# Patient Record
Sex: Male | Born: 1940 | Race: White | Hispanic: No | Marital: Married | Smoking: Former smoker
Health system: Southern US, Community
[De-identification: ages and names within clinical notes are randomized; demographics above are authoritative.]

## PROBLEM LIST (undated history)

## (undated) DIAGNOSIS — I429 Cardiomyopathy, unspecified: Secondary | ICD-10-CM

## (undated) DIAGNOSIS — D649 Anemia, unspecified: Secondary | ICD-10-CM

## (undated) DIAGNOSIS — N189 Chronic kidney disease, unspecified: Secondary | ICD-10-CM

## (undated) DIAGNOSIS — K4020 Bilateral inguinal hernia, without obstruction or gangrene, not specified as recurrent: Secondary | ICD-10-CM

## (undated) DIAGNOSIS — Z860100 Personal history of colon polyps, unspecified: Secondary | ICD-10-CM

## (undated) DIAGNOSIS — R55 Syncope and collapse: Secondary | ICD-10-CM

## (undated) DIAGNOSIS — J90 Pleural effusion, not elsewhere classified: Secondary | ICD-10-CM

## (undated) DIAGNOSIS — I499 Cardiac arrhythmia, unspecified: Secondary | ICD-10-CM

## (undated) DIAGNOSIS — D472 Monoclonal gammopathy: Secondary | ICD-10-CM

## (undated) DIAGNOSIS — I509 Heart failure, unspecified: Secondary | ICD-10-CM

## (undated) DIAGNOSIS — I700 Atherosclerosis of aorta: Secondary | ICD-10-CM

## (undated) DIAGNOSIS — K5790 Diverticulosis of intestine, part unspecified, without perforation or abscess without bleeding: Secondary | ICD-10-CM

## (undated) DIAGNOSIS — N400 Enlarged prostate without lower urinary tract symptoms: Secondary | ICD-10-CM

## (undated) DIAGNOSIS — N185 Chronic kidney disease, stage 5: Secondary | ICD-10-CM

## (undated) DIAGNOSIS — M1990 Unspecified osteoarthritis, unspecified site: Secondary | ICD-10-CM

## (undated) DIAGNOSIS — C439 Malignant melanoma of skin, unspecified: Secondary | ICD-10-CM

## (undated) DIAGNOSIS — T4145XA Adverse effect of unspecified anesthetic, initial encounter: Secondary | ICD-10-CM

## (undated) DIAGNOSIS — Z7901 Long term (current) use of anticoagulants: Secondary | ICD-10-CM

## (undated) DIAGNOSIS — C4359 Malignant melanoma of other part of trunk: Secondary | ICD-10-CM

## (undated) DIAGNOSIS — T8859XA Other complications of anesthesia, initial encounter: Secondary | ICD-10-CM

## (undated) DIAGNOSIS — I4891 Unspecified atrial fibrillation: Secondary | ICD-10-CM

## (undated) DIAGNOSIS — I2510 Atherosclerotic heart disease of native coronary artery without angina pectoris: Secondary | ICD-10-CM

## (undated) DIAGNOSIS — K625 Hemorrhage of anus and rectum: Secondary | ICD-10-CM

## (undated) DIAGNOSIS — E7800 Pure hypercholesterolemia, unspecified: Secondary | ICD-10-CM

## (undated) DIAGNOSIS — N184 Chronic kidney disease, stage 4 (severe): Secondary | ICD-10-CM

## (undated) DIAGNOSIS — I10 Essential (primary) hypertension: Secondary | ICD-10-CM

## (undated) DIAGNOSIS — N2581 Secondary hyperparathyroidism of renal origin: Secondary | ICD-10-CM

## (undated) HISTORY — DX: Benign prostatic hyperplasia without lower urinary tract symptoms: N40.0

## (undated) HISTORY — PX: CATARACT EXTRACTION: SUR2

## (undated) HISTORY — PX: APPENDECTOMY: SHX54

## (undated) HISTORY — DX: Malignant melanoma of skin, unspecified: C43.9

## (undated) HISTORY — PX: TONSILLECTOMY: SUR1361

## (undated) HISTORY — PX: COLONOSCOPY: SHX174

## (undated) HISTORY — DX: Diverticulosis of intestine, part unspecified, without perforation or abscess without bleeding: K57.90

## (undated) HISTORY — PX: JOINT REPLACEMENT: SHX530

## (undated) HISTORY — PX: ANKLE SURGERY: SHX546

## (undated) HISTORY — DX: Hemorrhage of anus and rectum: K62.5

---

## 1983-01-02 HISTORY — PX: ANKLE SURGERY: SHX546

## 2009-09-01 HISTORY — PX: TOTAL HIP ARTHROPLASTY: SHX124

## 2009-09-23 ENCOUNTER — Inpatient Hospital Stay: Payer: Self-pay | Admitting: Orthopedic Surgery

## 2009-09-23 ENCOUNTER — Ambulatory Visit: Payer: Self-pay | Admitting: Internal Medicine

## 2009-09-24 ENCOUNTER — Encounter: Payer: Self-pay | Admitting: Internal Medicine

## 2009-10-12 ENCOUNTER — Encounter: Payer: Self-pay | Admitting: Internal Medicine

## 2009-10-23 ENCOUNTER — Emergency Department: Payer: Self-pay | Admitting: Emergency Medicine

## 2010-01-28 ENCOUNTER — Inpatient Hospital Stay: Payer: Self-pay | Admitting: Internal Medicine

## 2010-01-29 ENCOUNTER — Encounter: Payer: Self-pay | Admitting: Internal Medicine

## 2010-02-02 LAB — PATHOLOGY REPORT

## 2010-02-14 ENCOUNTER — Encounter: Payer: Self-pay | Admitting: Internal Medicine

## 2010-02-14 ENCOUNTER — Ambulatory Visit (INDEPENDENT_AMBULATORY_CARE_PROVIDER_SITE_OTHER): Payer: Medicare Other | Admitting: Internal Medicine

## 2010-02-14 DIAGNOSIS — I4891 Unspecified atrial fibrillation: Secondary | ICD-10-CM | POA: Insufficient documentation

## 2010-02-14 DIAGNOSIS — Z8679 Personal history of other diseases of the circulatory system: Secondary | ICD-10-CM | POA: Insufficient documentation

## 2010-02-14 DIAGNOSIS — I482 Chronic atrial fibrillation, unspecified: Secondary | ICD-10-CM | POA: Insufficient documentation

## 2010-02-21 ENCOUNTER — Encounter: Payer: Self-pay | Admitting: Internal Medicine

## 2010-02-22 NOTE — Assessment & Plan Note (Signed)
Summary: Hospital F/U   Visit Type:  Initial Consult Primary Provider:  Sharla Kidney  CC:  Hospital follow-up. denies chest pain and SOB.[name]  History of Present Illness: Mr Lawrence Perkins is seen at the request ofa Dr Ouida Sills for atrial fibrilation  He has a past medical history notable for the absence of diabetes or hypertension who was found while in hospital in September to have atrial fibrillation with a rapid ventricular response with ECGs demonstrated heart rate of 160. These records were reviewed. He converted spontaneously. He had no associated palpitations. He has no known prior or subsequent history of atrial fibrillation.  Evaluation following that event included an ultrasound which demonstrated mild left atrial enlargement mild to moderate mitral regurgitation and normal left ventricular function. A Holter monitor demonstrated PACs, PVCs, both of which were relatively infrequent and nonsustained atrial tachycardia up to 8 beats.  Thromboembolic risk factors are negative probably except for age-[age] and a reference to possible TIAs multiple years ago as a possible explanation for visual disturbances. His were made by his eye doctor.  He has no exercise intolerance. He has had no problems with chest discomfort. He is limited largely by his hip. He denies a history of dyslipidemia. His family history for heart disease is negative. He has been started on metoprolol 100 mg for this.  Preventive Screening-Counseling & Management  Caffeine-Diet-Exercise     Does Patient Exercise: yes      Drug Use:  no.    Current Medications (verified): 1)  Metoprolol Succinate 100 Mg Xr24h-Tab (Metoprolol Succinate) .[name].. 1 Tablet Once Daily  Allergies (verified): No Known Drug Allergies  Past History:  Family History: Last updated: 02-27-10 Father: deceased-lung fibrosis Mother:deceased-cirrhosis of liver  Social History: Last updated: 02-27-10 Tobacco Use - No.  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  Alcohol Use - no Retired  Married  Regular Exercise - yes Drug Use - no Does Patient Exercise:  yes Drug Use:  no  Review of Systems       full review of systems was negative apart from a history of present illness and past medical history except glasses   Vital Signs:  Patient profile:   70 year old male Height:      72 inches Weight:      207.25 pounds BMI:     28.21 Pulse rate:   62 / minute BP sitting:   140 / 72  (left arm) Cuff size:   large  Vitals Entered By: Rodman Comp CMA 02/27/10 2:18 PM)  Physical Exam  General:  Well developed, well nourished,older Caucasian male appearing his stated agein no acute distress. Head:  normal HEENT Neck:  supple without thyromegaly neck veins flat carotids brisk and full bilaterally without bruits Chest Wall:  no deformities  noted Lungs:  Clear bilaterally to auscultation and percussion. Heart:  regular rate and rhythm without murmurs or gallops Abdomen:  Bowel sounds positive; abdomen soft and non-tender without  Pulsation Msk:  Back normal, normal gait. Muscle strength and tone normal. Pulses:  pulses normal in all 4 extremities  Extremities:  No clubbing or cyanosis or edema Neurologic:  Alert and oriented x 3.no significant motor or sensory defects noted Skin:  warm and dry and without rashes Cervical Nodes:  no significant adenopathy Psych:  Normal  affect.   Impression & Recommendations:  Problem # 1:  ATRIAL FIBRILLATION (ICD-427.31) the patient has atrial fibrillation. He has no associated symptoms. There are 2 issues. The first is the possible recurrence and no consequence of tachycardia-induced cardiomyopathy. I've instructed the patient in how to take his pulse. He is also on metoprolol. We'll plan to decrease his metoprolol from 100-50 as he is currently asymptomatic.  The second issue is thromboembolic risk stratification. The major issue is that he was told 10 years ago that he had TIAs as an explanation for transient visual disturbance. In the event that this is true he would need long-term anticoagulation with warfarin or one of the newer alternatives. In the absence of this, and a CHADS VASC score of one, he would need only low-dose aspirin or nothing at all.  hence clarifying this becomes essential. To that end we'll undertake an MRI scan of his brain. If it is normal we will presume that there is no prior TIA. It is abnormal he'll need long-term oral anticoagulation. The following medications were removed from the medication list:    Aspir-low 81 Mg Tbec (Aspirin) .[name]... 1 tablet once daily His updated medication list for this problem includes:    Metoprolol Succinate 100 Mg Xr24h-tab (Metoprolol succinate) .[name]... Take 1/2 tablet once daily.  Problem # 2:  CEREBROVASCULAR ACCIDENT, HX OF (ICD-V12.50) as above Orders: MRI (MRI)  Other Orders: EKG w/ Interpretation (93000)  Patient Instructions: 1)  Your physician recommends that you follow up as needed. 2)  Your physician has requested that you have a brain MRI. This is scheduled for 02/21/10 @ 2:00pm at [place] on the first floor of the building [employer] is in. There is no prep for this procedure. 3)  Your physician has recommended you make the following change in your medication: DECREASE Metoprolol 100mg  to 1/2 tablet once daily.

## 2010-02-28 NOTE — Procedures (Signed)
Summary: Holter and Event  Holter and Event   Imported By: Zenovia Jarred 02/16/2010 10:12:37  _____________________________________________________________________  External Attachment:    Type:   Image     Comment:   External Document

## 2010-03-14 NOTE — Consult Note (Signed)
Summary: [facility]   Imported By: Sallee Provencal 03/02/2010 14:07:11  _____________________________________________________________________  External Attachment:    Type:   Image     Comment:   External Document

## 2010-05-09 ENCOUNTER — Ambulatory Visit: Payer: Self-pay | Admitting: Orthopedic Surgery

## 2010-05-17 ENCOUNTER — Ambulatory Visit: Payer: Self-pay | Admitting: Orthopedic Surgery

## 2010-07-14 ENCOUNTER — Encounter: Payer: Self-pay | Admitting: Cardiology

## 2010-09-01 ENCOUNTER — Ambulatory Visit: Payer: Self-pay | Admitting: Unknown Physician Specialty

## 2011-01-10 ENCOUNTER — Ambulatory Visit: Payer: Self-pay | Admitting: Internal Medicine

## 2013-06-27 DIAGNOSIS — N183 Chronic kidney disease, stage 3 unspecified: Secondary | ICD-10-CM | POA: Insufficient documentation

## 2013-06-27 DIAGNOSIS — E785 Hyperlipidemia, unspecified: Secondary | ICD-10-CM | POA: Insufficient documentation

## 2013-06-27 DIAGNOSIS — I120 Hypertensive chronic kidney disease with stage 5 chronic kidney disease or end stage renal disease: Secondary | ICD-10-CM | POA: Insufficient documentation

## 2013-09-28 ENCOUNTER — Ambulatory Visit: Payer: Self-pay | Admitting: Unknown Physician Specialty

## 2013-09-29 LAB — PATHOLOGY REPORT

## 2014-01-05 ENCOUNTER — Observation Stay: Payer: Self-pay | Admitting: Internal Medicine

## 2014-01-05 LAB — CBC WITH DIFFERENTIAL/PLATELET
Basophil #: 0.1 10*3/uL (ref 0.0–0.1)
Basophil %: 0.8 %
EOS PCT: 1.9 %
Eosinophil #: 0.2 10*3/uL (ref 0.0–0.7)
HCT: 46.1 % (ref 40.0–52.0)
HGB: 15.2 g/dL (ref 13.0–18.0)
LYMPHS ABS: 1.5 10*3/uL (ref 1.0–3.6)
Lymphocyte %: 14.2 %
MCH: 33.2 pg (ref 26.0–34.0)
MCHC: 32.8 g/dL (ref 32.0–36.0)
MCV: 101 fL — ABNORMAL HIGH (ref 80–100)
MONOS PCT: 2.9 %
Monocyte #: 0.3 x10 3/mm (ref 0.2–1.0)
Neutrophil #: 8.4 10*3/uL — ABNORMAL HIGH (ref 1.4–6.5)
Neutrophil %: 80.2 %
PLATELETS: 224 10*3/uL (ref 150–440)
RBC: 4.57 10*6/uL (ref 4.40–5.90)
RDW: 13.3 % (ref 11.5–14.5)
WBC: 10.5 10*3/uL (ref 3.8–10.6)

## 2014-01-05 LAB — TROPONIN I
TROPONIN-I: 0.07 ng/mL — AB
Troponin-I: <0.02 ng/mL

## 2014-01-05 LAB — BASIC METABOLIC PANEL
Anion Gap: 8 (ref 7–16)
BUN: 22 mg/dL — AB (ref 7–18)
CHLORIDE: 104 mmol/L (ref 98–107)
CO2: 27 mmol/L (ref 21–32)
Calcium, Total: 8.7 mg/dL (ref 8.5–10.1)
Creatinine: 1.59 mg/dL — ABNORMAL HIGH (ref 0.60–1.30)
EGFR (African American): 55 — ABNORMAL LOW
EGFR (Non-African Amer.): 45 — ABNORMAL LOW
Glucose: 153 mg/dL — ABNORMAL HIGH (ref 65–99)
OSMOLALITY: 284 (ref 275–301)
POTASSIUM: 4.2 mmol/L (ref 3.5–5.1)
Sodium: 139 mmol/L (ref 136–145)

## 2014-01-05 LAB — URINALYSIS, COMPLETE
BACTERIA: NONE SEEN
BLOOD: NEGATIVE
Bilirubin,UR: NEGATIVE
Glucose,UR: NEGATIVE mg/dL (ref 0–75)
Hyaline Cast: 6
LEUKOCYTE ESTERASE: NEGATIVE
NITRITE: NEGATIVE
PH: 7 (ref 4.5–8.0)
SPECIFIC GRAVITY: 1.015 (ref 1.003–1.030)
Squamous Epithelial: NONE SEEN

## 2014-01-05 LAB — CK TOTAL AND CKMB (NOT AT ARMC)
CK, Total: 69 U/L (ref 39–308)
CK, Total: 52 U/L (ref 39–308)
CK-MB: 1.5 ng/mL (ref 0.5–3.6)
CK-MB: 1.2 ng/mL (ref 0.5–3.6)

## 2014-01-06 LAB — BASIC METABOLIC PANEL
Anion Gap: 5 — ABNORMAL LOW (ref 7–16)
BUN: 17 mg/dL (ref 7–18)
CO2: 26 mmol/L (ref 21–32)
CREATININE: 1.24 mg/dL (ref 0.60–1.30)
Calcium, Total: 8.1 mg/dL — ABNORMAL LOW (ref 8.5–10.1)
Chloride: 109 mmol/L — ABNORMAL HIGH (ref 98–107)
EGFR (Non-African Amer.): >60
Glucose: 90 mg/dL (ref 65–99)
Osmolality: 280 (ref 275–301)
Potassium: 4.3 mmol/L (ref 3.5–5.1)
SODIUM: 140 mmol/L (ref 136–145)

## 2014-01-06 LAB — CK TOTAL AND CKMB (NOT AT ARMC)
CK, TOTAL: 57 U/L (ref 39–308)
CK-MB: 1.1 ng/mL (ref 0.5–3.6)

## 2014-01-06 LAB — TROPONIN I: Troponin-I: 0.06 ng/mL — ABNORMAL HIGH

## 2014-02-04 ENCOUNTER — Ambulatory Visit: Payer: Self-pay | Admitting: Orthopedic Surgery

## 2014-02-04 LAB — URINALYSIS, COMPLETE
BILIRUBIN, UR: NEGATIVE
Bacteria: NONE SEEN
Glucose,UR: NEGATIVE mg/dL (ref 0–75)
Ketone: NEGATIVE
LEUKOCYTE ESTERASE: NEGATIVE
Nitrite: NEGATIVE
PH: 6 (ref 4.5–8.0)
Protein: 30
RBC,UR: 1 /HPF (ref 0–5)
SPECIFIC GRAVITY: 1.011 (ref 1.003–1.030)
SQUAMOUS EPITHELIAL: NONE SEEN
WBC UR: NONE SEEN /HPF (ref 0–5)

## 2014-02-04 LAB — BASIC METABOLIC PANEL
Anion Gap: 7 (ref 7–16)
BUN: 23 mg/dL — ABNORMAL HIGH (ref 7–18)
CO2: 27 mmol/L (ref 21–32)
Calcium, Total: 8.7 mg/dL (ref 8.5–10.1)
Chloride: 105 mmol/L (ref 98–107)
Creatinine: 1.25 mg/dL (ref 0.60–1.30)
EGFR (Non-African Amer.): >60
Glucose: 84 mg/dL (ref 65–99)
Osmolality: 280 (ref 275–301)
Potassium: 4.6 mmol/L (ref 3.5–5.1)
SODIUM: 139 mmol/L (ref 136–145)

## 2014-02-04 LAB — CBC
HCT: 44.3 % (ref 40.0–52.0)
HGB: 14.8 g/dL (ref 13.0–18.0)
MCH: 32.7 pg (ref 26.0–34.0)
MCHC: 33.3 g/dL (ref 32.0–36.0)
MCV: 98 fL (ref 80–100)
Platelet: 190 10*3/uL (ref 150–440)
RBC: 4.51 10*6/uL (ref 4.40–5.90)
RDW: 12.9 % (ref 11.5–14.5)
WBC: 4.8 10*3/uL (ref 3.8–10.6)

## 2014-02-04 LAB — APTT: Activated PTT: 30.4 secs (ref 23.6–35.9)

## 2014-02-04 LAB — PROTIME-INR
INR: 1
PROTHROMBIN TIME: 13.7 s

## 2014-02-04 LAB — MRSA PCR SCREENING

## 2014-02-04 LAB — SEDIMENTATION RATE: Erythrocyte Sed Rate: 13 mm/hr (ref 0–20)

## 2014-02-18 ENCOUNTER — Inpatient Hospital Stay: Payer: Self-pay | Admitting: Orthopedic Surgery

## 2014-04-26 LAB — SURGICAL PATHOLOGY

## 2014-05-02 NOTE — Discharge Summary (Signed)
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   OPERATION: On 02/18/2014 the patient had removal of deep hardware with a right total hip replacement.   ANESTHESIA: Spinal.   SURGEON: Hessie Knows, MD.   COMPLICATIONS: None.   ESTIMATED BLOOD LOSS: Was 1500 mL.  INTRAVENOUS FLUIDS GIVEN: 2500 ml IV fluid.  IMPLANTS USED: Medacta 8AMIS stem, 58 mm Versafitcup DM and liner and a L28 head.   The patient was stabilized, brought to the recovery room and then brought down to the orthopedic floor where he was treated for pain control and physical therapy.   HISTORY AND PHYSICAL: The patient is a 74 year old male that presented for persistent pain involving his right hip. The patient is status post right hip pinning done in 2011. The patient has been refractory to conservative management.   PHYSICAL EXAMINATION: GENERAL: Alert male with an antalgic gait and a very slow gait with pain with limping.  EXTREMITIES: Right lower extremity: The patient has full 100 degree hip flexion with -10 degrees internal rotation and 10 degrees external rotation with pain. The patient has a benign knee exam. The patient has neurovascular that intact.   HOSPITAL COURSE: After initial admission on 02/18/2014, the patient was brought to the orthopedic floor. On postoperative day 1, the patient had a hemoglobin of 11.4 after receiving transfused blood. The patient dropped down to 10.3 on 02/19. After the hemoglobin of 10.3, the patient had physical therapy work with him. He was able to ambulate 275 feet on postoperative day 1, had a bowel movement that evening and then was ready to go home on 02/20/2014.    CONDITION AT DISCHARGE: Stable.   DISPOSITION: The patient was sent home with home health, physical therapy.   DISCHARGE INSTRUCTIONS: The patient will follow up at [facility] in 2 weeks. The patient will do weight bear as tolerated on the affected leg. The patient will use 1 to 2 pillows and use anterior hip precautions. The patient will use knee-high TED hose on both legs, removed at nighttime. The patient will do elevation of his heels off the bed and use incentive spirometer as well as be encouraged to do cough and deep breathing. The patient's diet is regular. The patient will keep his dressing clean and dry and try not to get it wet. The patient will call the clinic if there is any bright red bleeding, calf pain, bowel or bladder difficulty or any fever greater than 101.5. The patient will do home health physical therapy, working on gait training and strengthening.   DISCHARGE MEDICATIONS: Are to resume home medications and then to add Tylenol 500 mg 1 tablet every 4 hours as needed for fever or light pain and oxycodone 5 mg 1 tablet every 4 hours as needed for severe pain as well as Lovenox 40 mg subcutaneous once a day for 14 days and then discontinue this and begin aspirin 81 mg once a day.    ____________________________ Lenna Sciara. Reche Dixon, [place] jtm:TT D: 02/20/2014 07:05:07 ET T: 02/20/2014 13:40:02 ET JOB#: [id number]  cc: J. Reche Dixon, [place], <Dictator> J Unknown Flannigan [facility] PA ELECTRONICALLY SIGNED 02/21/2014 7:12

## 2014-05-02 NOTE — H&P (Signed)
PATIENT NAME:  Lawrence Perkins, Lawrence Perkins MR#:  [id number] DATE OF BIRTH:  06/20/1940  DATE OF ADMISSION:  01/05/2014  ADMITTING PHYSICIAN: Gladstone Lighter, MD  PRIMARY CARE PHYSICIAN: Ocie Cornfield. Ouida Sills, MD  CHIEF COMPLAINT: Syncope.   HISTORY OF PRESENT ILLNESS: Lawrence Perkins is a 74 year old healthy Caucasian male with no significant past medical history other than 1 episode of paroxysmal atrial fibrillation after a surgery, benign prostatic hypertrophy, who presents to the hospital after he passed out at the badminton court today. The patient is awake, alert, oriented at this time. States he as mentioned badminton, doubles, with his friends and he bumped into his partner chasing a birdie and then hit his head and passed out. He does not remember anything else after that. Prior to that, he denies any dizziness, chest pain, dyspnea, or other aura. He has not had syncopal episodes in the past. He states he might have been dehydration, as his labs indicate at this time. He denies any nausea, vomiting, diarrhea, or other illnesses at this time. He is otherwise relatively healthy.   PAST MEDICAL HISTORY:  1.  Benign prostatic hypertrophy.  2.  Paroxysmal atrial fibrillation, once after his surgery.   SURGERIES:  1.  Appendectomy.  2.  Right hip replacement surgery.   ALLERGIES TO MEDICATIONS: No known drug allergies.   CURRENT HOME MEDICATIONS:  1.  Aspirin 81 mg p.o. daily.  2.  Flomax 0.4 mg p.o. daily.  3.  Metoprolol 50 mg p.o. daily.   SOCIAL HISTORY: Lives at home. No history of any smoking. Occasional alcohol use.   FAMILY HISTORY: Significant for unknown cancer in father, and mom with cirrhosis and liver disease.   REVIEW OF SYSTEMS:  CONSTITUTIONAL: No fever, fatigue, or weakness.  EYES: No blurred vision, double vision, inflammation or glaucoma.  EARS, NOSE, AND THROAT: No tinnitus, ear pain, hearing loss, epistaxis, or discharge.  RESPIRATORY: No cough, wheeze, hemoptysis, or COPD.   CARDIOVASCULAR: No chest pain, orthopnea, edema, arrhythmia, palpitations. Positive for syncope.  GASTROINTESTINAL: No nausea, vomiting, diarrhea, abdominal pain, hematemesis, or melena.  GENITOURINARY: No dysuria, hematuria, renal calculus, frequency, or incontinence.  ENDOCRINE: No polyuria, nocturia, thyroid problems, heat or cold intolerance.  HEMATOLOGY: No anemia, easy bruising, or bleeding.   SKIN: No acne, rash, or lesions.  MUSCULOSKELETAL: No neck, back or shoulder pain, arthritis, or gout.  NEUROLOGIC: No numbness, weakness, CVA, TIA, or seizures.  PSYCHOLOGICAL: No anxiety, insomnia or depression.   PHYSICAL EXAMINATION:  VITAL SIGNS: Temperature 97.4 degrees Fahrenheit, pulse 66, respirations 18, blood pressure 145/74, pulse of 98% on room air.  GENERAL: Well built, well nourished male, sitting in bed, not in any acute distress.  HEENT: Normocephalic, atraumatic. Pupils equal, round, reacting to light. Anicteric sclerae. Extraocular movements intact. Oropharynx is clear, without erythema, mass or exudates. NECK: Supple. No thyromegaly, JVD, or carotid bruit.  No lymphadenopathy.  LUNGS: Moving air bilaterally. No wheeze or crackles. No use of accessory muscles for breathing.  CARDIOVASCULAR: S1, S2. Regular rate and rhythm. No murmurs, rubs, or gallops.  ABDOMEN: Soft, nontender, nondistended. No hepatosplenomegaly. Normal bowel sounds.  EXTREMITIES: No pedal edema. No clubbing or cyanosis. There are 2+ dorsalis pedis pulses palpable bilaterally.  SKIN: No acne, rash or lesions.  LYMPHATICS: No cervical or inguinal lymphadenopathy.  NEUROLOGIC: Cranial nerves intact. No focal motor or sensory deficits.  PSYCHOLOGICAL: The patient is awake, alert, oriented x 3.   LABORATORY DATA: WBC 10.5, hemoglobin 15.2, hematocrit 46.1, platelet count 224,000.  Sodium 139, potassium  4.2, chloride 104, bicarbonate 27, BUN 22, creatinine 1.59 and glucose 153, calcium of 8.7. Troponin less than  0.02. Urinalysis negative for any infection. CT of the head without contrast showing normal for age; noncontrast CT appearance.   Chest x-ray showing left lower lobe airspace consolidation; otherwise clear. Cardiac enlargement without heart failure.   EKG showing sinus rhythm with some PACs. Heart rate of 64. No acute ST-T wave abnormalities noted.   ASSESSMENT AND PLAN: This is a 74 year old male with a past medical history significant for BPH and hypertension, admitted after a syncopal episode.  1.  Syncope after a concussion, also dehydration. We will admit under observation, monitored on telemetry. Also has history of paroxysmal atrial fibrillation. Recycle troponins. IV fluids.  CT head is normal.  2.  Left lower lobe consolidation versus atelectasis. The patient is afebrile. No white count. No respiratory symptoms. We will consider it as atelectasis and encourage incentive spirometry. Hold off on antibiotics at this time.  3.  Paroxysmal atrial fibrillation. Remote history after his hip surgery. Currently in normal sinus rhythm. Continue aspirin and Toprol. No other risk factors.  4.  Benign prostatic hypertrophy. Continue Flomax.  5.  Deep vein thrombosis prophylaxis.   CODE STATUS: Full Code.   TIME SPENT ON ADMISSION: 50 minutes.    ____________________________ Gladstone Lighter, MD rk:MT D: 01/05/2014 15:16:56 ET T: 01/05/2014 15:53:27 ET JOB#: [id number]  cc: Gladstone Lighter, MD, <Dictator> Ocie Cornfield. Ouida Sills, MD Gladstone Lighter MD ELECTRONICALLY SIGNED 01/20/2014 13:33

## 2014-05-02 NOTE — Op Note (Signed)
PATIENT NAME:  Lawrence Perkins, Lawrence Perkins MR#:  [id number] DATE OF BIRTH:  [birth year]  DATE OF PROCEDURE:  02/18/2014   PREOPERATIVE DIAGNOSIS: Right hip severe osteoarthritis, possible avascular necrosis with retained hardware.   POSTOPERATIVE DIAGNOSIS: Right hip severe osteoarthritis, possible avascular necrosis with retained hardware.   PROCEDURE: Removal of deep hardware and right total hip replacement.   ANESTHESIA: Spinal.   SURGEON: Hessie Knows, M.D.   DESCRIPTION OF PROCEDURE: The patient was brought to the Operating Room and after adequate anesthesia was obtained, the patient was placed on the operative table with the left leg on the well-padded table, right foot in the Medacta attachment.  It was prepped and draped in the usual sterile fashion, appropriate patient identification and timeout procedure were completed. The prior lateral incision was opened, but the screws could to be palpated and the anterior approach was then carried out centered over the TFL and greater trochanter. The TFL was retracted laterally and deep fascia incised with ligation of the lateral femoral circumflex vessels.  The anterior capsule was opened and retractors placed. Femoral neck cut was made and the head was removed in sections until the screws could be identified and pushed out laterally at which point there were removed without difficulty along with the washers. There is quite a bit of oozing from the femoral bone during this part of the procedure which did not really stop until implants were placed. The hip was then sequentially reamed. It was very sclerotic and the head had significant deformity with reaming to 58 mm, which gave good bleeding bone.  A 58 mm cup fit well and was impacted into place. The leg was then externally rotated.  It was quite stiff and pubofemoral and ischiofemoral release were required.  The leg was dropped into extension and broaching was carried out to a size 8, which gave a good fill with  an L head on trials, the leg length appeared restored as it had been fairly short approximately 1 cm short compared to the other side at the start of the case. The final components were placed with the 8 standard AMI Stem impacted down the canal, the L28 head plus and the DM-liner for the Versafit liner cup size 58 assembled on the back table and impacted and the hip was reduced. The wound was irrigated with Betadine  solution and then the deep fascia repaired using running heavy quill suture. A subcutaneous drain placed, 2-0 Quill subcutaneously and skin staples. Xeroform, 4 x 4's, ABD and tape applied. The lateral incision was closed with 2-0 Vicryl and skin staples. Hardware was discarded per patient request.   COMPLICATIONS:  None.   SPECIMEN:  The resected head.   ESTIMATED BLOOD LOSS: 1500 approximately with IV fluids given 2500.     ____________________________ Laurene Footman, MD mjm:at D: 02/18/2014 20:12:08 ET T: 02/18/2014 20:53:30 ET JOB#: [id number]  cc: Laurene Footman, MD, <Dictator> Laurene Footman MD ELECTRONICALLY SIGNED 02/19/2014 7:11

## 2014-05-02 NOTE — Consult Note (Signed)
PATIENT NAME:  Lawrence Perkins, Lawrence Perkins MR#:  [id number] DATE OF BIRTH:  30-Jan-1940  DATE OF CONSULTATION:  01/06/2014  REFERRING PHYSICIAN:   CONSULTING PHYSICIAN:  Laurene Footman, MD  REASON FOR CONSULTATION: Right hip pain.    ATTENDING PHYSICIAN:  Dr. Frazier Richards.   BRIEF HISTORY OF PRESENT ILLNESS: The patient is a 74 year old who fell while playing indoor badminton, had a syncopal episode, and was admitted for this. He has a history of having a right femoral neck fracture about 3 years ago treated with multiple pinning, 1 of the pins backed out and he has 2 residual pins. He has been having significant problems with his gait, limping quite a bit and making him less active.   PHYSICAL EXAMINATION:  On exam he has pain with logrolling of the right leg.  No real tenderness to palpation around the pelvis other than anterior pubis on the right side. He does have a slight flexion contracture.   IMAGING: Shows severe degenerative arthritis on the right hip, moderate on the left. His MRI shows a nondisplaced sacral fracture as well as pubic ramus fracture extending to the acetabulum.   IMPRESSION: Right-sided posttraumatic arthritis with retained hardware and new pelvic fractures.   RECOMMENDATION: Follow up in 3 weeks. He is weight-bearing as tolerated on the right.  His fractures need to heal before he can undergo hip replacement. Order has been placed for a followup with me in approximately 3 weeks.    ____________________________ Laurene Footman, MD mjm:bu D: 01/06/2014 12:44:55 ET T: 01/06/2014 13:10:15 ET JOB#: [id number]  cc: Laurene Footman, MD, <Dictator> Laurene Footman MD ELECTRONICALLY SIGNED 01/07/2014 7:17

## 2014-05-02 NOTE — Consult Note (Signed)
Brief Consult Note: Diagnosis: right side sacrum and pubic rami fractures.   Patient was seen by consultant.   Consult note dictated.   Orders entered.   Comments: recommend WBAT on Right, will need THA in near future but will want to wait for pelvis to heal.  Electronic Signatures: Laurene Footman (MD)  (Signed 06-Jan-16 12:42)  Authored: Brief Consult Note   Last Updated: 06-Jan-16 12:42 by Laurene Footman (MD)

## 2014-08-27 NOTE — Patient Outreach (Signed)
[place] [facility]) Care Management  08/27/2014  [name]. [birth year] [id number]   Referral from HTA tier 4 list, assigned to [name], [facility] for patient outreach.  [name] L. Jammi Morrissette, [place] Care Management Assistant

## 2014-08-31 NOTE — Patient Outreach (Signed)
[place] [facility]) Care Management  08/31/2014  Lawrence Perkins. 1940/08/06 [id number]   Referral from HTA tier 4 list, reassigned to [place], RNCM for patient outreach.  [name] L. Driana Dazey, [place] Care Management Assistant

## 2014-09-07 ENCOUNTER — Other Ambulatory Visit: Payer: Self-pay | Admitting: *Deleted

## 2014-09-07 NOTE — Patient Outreach (Signed)
RNCM was successful in reaching this pt. [facility] services explained. Pt was not interested in participating in [facility] services at this time. RNCM asked pt if she could send him information in the mail about [facility] and pt was agreeable to this.   Plan: Route letter through [facility] which includes [facility] pamphlet. Make [facility] aware this pt is not interested in [facility] services at this time.  Rutherford Limerick RN, BSN  [facility] Care Management [phone])

## 2014-09-07 NOTE — Patient Outreach (Signed)
[facility]) Care Management  09/07/2014  Lawrence Perkins. Feb 01, 1940 [id number]   Notification received from [place] to close case due to patient refusing services.  [name], [name] Care Management Assistant

## 2014-11-15 DIAGNOSIS — Z0000 Encounter for general adult medical examination without abnormal findings: Secondary | ICD-10-CM | POA: Insufficient documentation

## 2015-03-01 DIAGNOSIS — M2012 Hallux valgus (acquired), left foot: Secondary | ICD-10-CM | POA: Diagnosis not present

## 2015-03-01 DIAGNOSIS — M898X9 Other specified disorders of bone, unspecified site: Secondary | ICD-10-CM | POA: Diagnosis not present

## 2015-03-01 DIAGNOSIS — M2042 Other hammer toe(s) (acquired), left foot: Secondary | ICD-10-CM | POA: Diagnosis not present

## 2015-03-01 DIAGNOSIS — M7752 Other enthesopathy of left foot: Secondary | ICD-10-CM | POA: Diagnosis not present

## 2015-04-07 ENCOUNTER — Other Ambulatory Visit: Payer: Self-pay | Admitting: *Deleted

## 2015-04-07 ENCOUNTER — Encounter: Payer: Self-pay | Admitting: *Deleted

## 2015-04-07 NOTE — Patient Outreach (Signed)
[facility]) Care Management  04/07/2015  Lawrence Perkins. 08-Feb-1940 [id number]  Subjective: Telephone call to patient's home number, spoke with patient, and HIPAA verified.  Patient states he is doing well.  Discussed [facility] Care Management program and patient in agreement to complete telephone screen.   Patient states his primary MD is Dr. Frazier Richards at the [facility] [address] [place], [place]  [address], [phone])  and [facility] advised will request information to be added to patient's electronic medical record.    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    States he has arthritis in his left hip also, MD is recommending surgery in the future and it is not currently bothering him at this time.  States he has recovered well from his right hip surgery last year.  States he has no care coordination, disease education, disease monitoring, transportation, or community resource needs at this time.  Patient declined [place] Management services due to no care management needs and is in agreement to receive [place] Management program information.    Objective: Per Epic case review: Patient was hospitalized 02/18/14 - 02/20/14 for right hip surgery.   Patient refused [place] Management services on 09/07/14.  Assessment: Received HTA Tier 4 list referral on 03/31/15.   0 Admissions and 2 ER visits.   No diagnosis listed.  Request to confirm if patient's primary MD is Dr. Ouida Sills.   Plan: RNCM will send patient successful outreach letter, [facility] pamphlet, and magnet. RNCM will send patient's primary MD case closure letter due to refusal/ no care management needs. RNCM will send request to update patient's primary MD in chart,  to Josepha Pigg at [place] [facility].  RNCM will send case closure due to refusal / no care management needs request to Josepha Pigg at [place] [facility].  [name], BSN, [facility]  Management [facility] Telephonic CM Phone: [phone] Fax: [phone]

## 2015-04-28 DIAGNOSIS — D3131 Benign neoplasm of right choroid: Secondary | ICD-10-CM | POA: Diagnosis not present

## 2015-04-28 DIAGNOSIS — H2513 Age-related nuclear cataract, bilateral: Secondary | ICD-10-CM | POA: Diagnosis not present

## 2015-05-09 DIAGNOSIS — N183 Chronic kidney disease, stage 3 (moderate): Secondary | ICD-10-CM | POA: Diagnosis not present

## 2015-05-09 DIAGNOSIS — I129 Hypertensive chronic kidney disease with stage 1 through stage 4 chronic kidney disease, or unspecified chronic kidney disease: Secondary | ICD-10-CM | POA: Diagnosis not present

## 2015-05-09 DIAGNOSIS — E7800 Pure hypercholesterolemia, unspecified: Secondary | ICD-10-CM | POA: Diagnosis not present

## 2015-05-16 DIAGNOSIS — E7800 Pure hypercholesterolemia, unspecified: Secondary | ICD-10-CM | POA: Diagnosis not present

## 2015-05-16 DIAGNOSIS — I4891 Unspecified atrial fibrillation: Secondary | ICD-10-CM | POA: Diagnosis not present

## 2015-05-16 DIAGNOSIS — N183 Chronic kidney disease, stage 3 (moderate): Secondary | ICD-10-CM | POA: Diagnosis not present

## 2015-05-16 DIAGNOSIS — I129 Hypertensive chronic kidney disease with stage 1 through stage 4 chronic kidney disease, or unspecified chronic kidney disease: Secondary | ICD-10-CM | POA: Diagnosis not present

## 2015-08-15 ENCOUNTER — Other Ambulatory Visit: Payer: Self-pay | Admitting: Orthopedic Surgery

## 2015-08-15 DIAGNOSIS — M5431 Sciatica, right side: Secondary | ICD-10-CM

## 2015-08-15 DIAGNOSIS — M79604 Pain in right leg: Secondary | ICD-10-CM | POA: Diagnosis not present

## 2015-08-22 ENCOUNTER — Ambulatory Visit: Payer: PPO

## 2015-09-01 ENCOUNTER — Ambulatory Visit
Admission: RE | Admit: 2015-09-01 | Discharge: 2015-09-01 | Disposition: A | Discharge status: Home / Self Care | Payer: PPO | Source: Ambulatory Visit | Attending: Orthopedic Surgery | Admitting: Orthopedic Surgery

## 2015-09-01 DIAGNOSIS — M5431 Sciatica, right side: Secondary | ICD-10-CM | POA: Insufficient documentation

## 2015-09-01 DIAGNOSIS — M4806 Spinal stenosis, lumbar region: Secondary | ICD-10-CM | POA: Diagnosis not present

## 2015-09-01 DIAGNOSIS — M5126 Other intervertebral disc displacement, lumbar region: Secondary | ICD-10-CM | POA: Diagnosis not present

## 2015-09-01 DIAGNOSIS — R937 Abnormal findings on diagnostic imaging of other parts of musculoskeletal system: Secondary | ICD-10-CM | POA: Insufficient documentation

## 2015-09-06 DIAGNOSIS — I129 Hypertensive chronic kidney disease with stage 1 through stage 4 chronic kidney disease, or unspecified chronic kidney disease: Secondary | ICD-10-CM | POA: Diagnosis not present

## 2015-09-06 DIAGNOSIS — N183 Chronic kidney disease, stage 3 (moderate): Secondary | ICD-10-CM | POA: Diagnosis not present

## 2015-09-06 DIAGNOSIS — R898 Other abnormal findings in specimens from other organs, systems and tissues: Secondary | ICD-10-CM | POA: Diagnosis not present

## 2015-09-06 DIAGNOSIS — E7800 Pure hypercholesterolemia, unspecified: Secondary | ICD-10-CM | POA: Diagnosis not present

## 2015-09-26 NOTE — Progress Notes (Signed)
Lawrence Perkins  [phone] [phone]  ID: [name] OB: [birth year]  MR#: [id number]  FTD#:[id number]  Patient Care Team: Kirk Ruths, MD as PCP - General (Internal Medicine)  CHIEF COMPLAINT: Anemia, unspecified; abnormal bone marrow signal noted on MRI.  INTERVAL HISTORY: Patient is a 75 year old male who was noted to have a mild anemia as well as a "abnormal bone marrow signal" on MRI of his lumbar spine. He continues to have low back stiffness and pain with right leg cramping, but otherwise feels well. He denies any recent fevers or illnesses. He denies any other pain. He has a good appetite and denies weight loss. He has no neurologic complaints. He denies any night sweats. He has no chest pain or shortness of breath. He denies any nausea, vomiting, constipation, or diarrhea. He has no urinary complaints. Patient otherwise feels well and offers no further specific complaints.  REVIEW OF SYSTEMS:   Review of Systems  Constitutional: Negative.  Negative for fever, malaise/fatigue and weight loss.  Respiratory: Negative.  Negative for cough and shortness of breath.   Cardiovascular: Negative.  Negative for chest pain and leg swelling.  Gastrointestinal: Negative.  Negative for abdominal pain.  Genitourinary: Negative.   Musculoskeletal: Positive for back pain. Negative for falls.  Neurological: Negative.  Negative for weakness.  Endo/Heme/Allergies: Does not bruise/bleed easily.  Psychiatric/Behavioral: Negative.  The patient is not nervous/anxious.     As per HPI. Otherwise, a complete review of systems is negative.  PAST MEDICAL HISTORY: Past Medical History:  Diagnosis Date  . Diverticulosis   . Gout   . 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 Prostatism   . Rectal bleeding     PAST SURGICAL HISTORY: Past Surgical History:  Procedure Laterality Date  . ANKLE SURGERY     following volleyball injury   . APPENDECTOMY    . TONSILLECTOMY    . TOTAL HIP ARTHROPLASTY  09/2009   Right femoral neck fracture    FAMILY HISTORY: Family History  Problem Relation Age of Onset  . Cirrhosis Mother   . Other Father     Lung Fibrosis    ADVANCED DIRECTIVES (Y/N):  N  HEALTH MAINTENANCE: Social History  Substance Use Topics  . Smoking status: Former Smoker    Years: 15.00  . Smokeless tobacco: Never Used  . Alcohol use No     Colonoscopy:  PAP:  Bone density:  Lipid panel:  No Known Allergies  Current Outpatient Prescriptions  Medication Sig Dispense Refill  . alendronate (FOSAMAX) 70 MG tablet Take 70 mg by mouth once a week. Take with a full glass of water on an empty stomach.    [name] aspirin EC 81 MG tablet Take 81 mg by mouth daily.    . Cholecalciferol (VITAMIN D3) 2000 units TABS Take 1 tablet by mouth daily.    . metoprolol (LOPRESSOR) 50 MG tablet Take 50 mg by mouth daily.    . tamsulosin (FLOMAX) 0.4 MG CAPS capsule Take 0.4 mg by mouth daily.     No current facility-administered medications for this visit.     OBJECTIVE: Vitals:   09/27/15 1514  BP: (!) 160/88  Pulse: (!) 53  Resp: 18  Temp: (!) 95.4 F (35.2 C)     Body mass index is 27.58 kg/m.    ECOG FS:0 - Asymptomatic  General: Well-developed, well-nourished, no acute distress. Eyes: Pink conjunctiva, anicteric sclera. HEENT: Normocephalic, moist mucous  membranes, clear oropharnyx. Lungs: Clear to auscultation bilaterally. Heart: Regular rate and rhythm. No rubs, murmurs, or gallops. Abdomen: Soft, nontender, nondistended. No organomegaly noted, normoactive bowel sounds. Musculoskeletal: No edema, cyanosis, or clubbing. Neuro: Alert, answering all questions appropriately. Cranial nerves grossly intact. Skin: No rashes or petechiae noted. Psych: Normal affect. Lymphatics: No cervical, calvicular, axillary or inguinal LAD.   LAB RESULTS:  Lab Results  Component Value Date   NA 138 09/27/2015   K  4.8 09/27/2015   CL 105 09/27/2015   CO2 25 09/27/2015   GLUCOSE 90 09/27/2015   BUN 35 (H) 09/27/2015   CREATININE 1.51 (H) 09/27/2015   CALCIUM 9.1 09/27/2015   GFRNONAA 43 (L) 09/27/2015   GFRAA 50 (L) 09/27/2015    Lab Results  Component Value Date   WBC 5.3 09/27/2015   NEUTROABS 3.6 09/27/2015   HGB 14.4 09/27/2015   HCT 42.6 09/27/2015   MCV 97.9 09/27/2015   PLT 199 09/27/2015     STUDIES: Mr Lumbar Spine Wo Contrast  Addendum Date: 09/01/2015   ADDENDUM REPORT: 09/01/2015 08:59 ADDENDUM: Study discussed by telephone with PA Rachelle Hora for Dr Legrand Como [facility] on 09/01/2015 at 0855 hours. Electronically Signed   By: Genevie Ann M.D.   On: 09/01/2015 08:59   Result Date: 09/01/2015 CLINICAL DATA:  75 year old male with low back stiffness. Posterior right leg cramping for 1 month. Right side sciatica. Initial encounter. Previous right hip arthroplasty. EXAM: MRI LUMBAR SPINE WITHOUT CONTRAST TECHNIQUE: Multiplanar, multisequence MR imaging of the lumbar spine was performed. No intravenous contrast was administered. COMPARISON:  Right hip MRI 01/06/2014. FINDINGS: Segmentation: Lumbar segmentation appears to be normal and will be designated as such for this report. Alignment: Preserved vertebral height and alignment. There are mild superior endplate deformities at L1 and L2, but favor these are degenerative in nature (Schmorl nodes). Vertebrae: There is mild degenerative appearing endplate marrow edema at L1 affecting both the superior and inferior endplates. However, there is a more generalized very heterogeneous appearance of the bone marrow throughout the visible skeleton and pelvis, with numerous T1 hypo intense and STIR hyperintense small lesions. The sacrum and pelvis marrow signal appear significantly different than in 2016. Conus medullaris: Extends to the T12 level and appears normal. Congenitally capacious spinal canal. Paraspinal and other soft tissues: Visualized abdominal viscera  and paraspinal soft tissues are within normal limits. Disc levels: T11-T12:  Negative. T12-L1:  Negative. L1-L2: Disc space loss and mild circumferential disc bulge. No significant stenosis. L2-L3: Severe disc space loss. Left eccentric circumferential disc osteophyte complex. Mild facet hypertrophy. Mild left lateral recess stenosis. L3-L4: Disc space loss with mild circumferential disc bulge. Mild facet and ligament flavum hypertrophy. No significant stenosis. L4-L5: Disc desiccation with mild disc bulge. Small central annular fissure. Borderline to mild facet hypertrophy. Mild right L4 foraminal stenosis. L5-S1:  Mild facet hypertrophy.  No stenosis. IMPRESSION: 1. Abnormal bone marrow signal, nonspecific but suspicious for multiple myeloma or other marrow infiltrative process. Further evaluation recommended. 2. No pathologic fracture. Endplate degeneration most pronounced at L1, in part related to Schmorl nodes. 3. Chronic lumbar disc and endplate degeneration, but congenitally capacious spinal canal such that there is no spinal stenosis. There is mild left lateral recess stenosis at L2-L3. There is mild right foraminal stenosis at L4-L5. Electronically Signed: By: Genevie Ann M.D. On: 09/01/2015 08:48    ASSESSMENT: Anemia, unspecified; abnormal bone marrow signal noted on MRI.  PLAN:    1. Anemia, unspecified: Patient's hemoglobin is  now within normal limits. The remainder of his blood work including SPEP is within normal limits. No intervention is needed at this time. Return to clinic in 3 months with repeat laboratory work and further evaluation. 2. Abnormal bone marrow signal: Unclear etiology. Other than a mildly elevated kappa free light chain and  lambda free light chain with a normal ratio, all of patient's laboratory work is either negative or within normal limits. Peripheral blood flow cytometry is pending at time of dictation. No intervention is needed at this time. Given the fact that he has no  peripheral lab abnormalities and patient is asymptomatic, a bone marrow biopsy is not necessary at this time. Return to clinic in 3 months with repeat laboratory work and further evaluation. If all of his laboratory work remains within normal limits at that time, he likely can be discharged from clinic. 3. Renal insufficiency: Patient's creatinine is mildly elevated, it unclear his baseline. Monitor.   Patient expressed understanding and was in agreement with this plan. He also understands that He can call clinic at any time with any questions, concerns, or complaints.    Lloyd Huger, MD   09/30/2015 5:22 PM

## 2015-09-27 ENCOUNTER — Inpatient Hospital Stay: Payer: PPO

## 2015-09-27 ENCOUNTER — Encounter: Payer: Self-pay | Admitting: Oncology

## 2015-09-27 ENCOUNTER — Inpatient Hospital Stay: Payer: PPO | Attending: Oncology | Admitting: Oncology

## 2015-09-27 VITALS — BP 160/88 | HR 53 | Temp 95.4°F | Resp 18 | Wt 203.4 lb

## 2015-09-27 DIAGNOSIS — Z79899 Other long term (current) drug therapy: Secondary | ICD-10-CM | POA: Insufficient documentation

## 2015-09-27 DIAGNOSIS — N400 Enlarged prostate without lower urinary tract symptoms: Secondary | ICD-10-CM | POA: Insufficient documentation

## 2015-09-27 DIAGNOSIS — Z7982 Long term (current) use of aspirin: Secondary | ICD-10-CM | POA: Insufficient documentation

## 2015-09-27 DIAGNOSIS — N289 Disorder of kidney and ureter, unspecified: Secondary | ICD-10-CM | POA: Insufficient documentation

## 2015-09-27 DIAGNOSIS — D649 Anemia, unspecified: Secondary | ICD-10-CM | POA: Diagnosis not present

## 2015-09-27 DIAGNOSIS — M109 Gout, unspecified: Secondary | ICD-10-CM | POA: Insufficient documentation

## 2015-09-27 DIAGNOSIS — Z96641 Presence of right artificial hip joint: Secondary | ICD-10-CM | POA: Insufficient documentation

## 2015-09-27 DIAGNOSIS — Z87891 Personal history of nicotine dependence: Secondary | ICD-10-CM | POA: Diagnosis not present

## 2015-09-27 LAB — CBC WITH DIFFERENTIAL/PLATELET
BASOS ABS: 0 10*3/uL (ref 0–0.1)
Basophils Relative: 0 %
EOS ABS: 0.2 10*3/uL (ref 0–0.7)
EOS PCT: 4 %
HCT: 42.6 % (ref 40.0–52.0)
Hemoglobin: 14.4 g/dL (ref 13.0–18.0)
LYMPHS PCT: 22 %
Lymphs Abs: 1.2 10*3/uL (ref 1.0–3.6)
MCH: 33.2 pg (ref 26.0–34.0)
MCHC: 33.9 g/dL (ref 32.0–36.0)
MCV: 97.9 fL (ref 80.0–100.0)
MONO ABS: 0.4 10*3/uL (ref 0.2–1.0)
Monocytes Relative: 7 %
Neutro Abs: 3.6 10*3/uL (ref 1.4–6.5)
Neutrophils Relative %: 67 %
PLATELETS: 199 10*3/uL (ref 150–440)
RBC: 4.34 MIL/uL — ABNORMAL LOW (ref 4.40–5.90)
RDW: 13.4 % (ref 11.5–14.5)
WBC: 5.3 10*3/uL (ref 3.8–10.6)

## 2015-09-27 LAB — BASIC METABOLIC PANEL
ANION GAP: 8 (ref 5–15)
BUN: 35 mg/dL — ABNORMAL HIGH (ref 6–20)
CO2: 25 mmol/L (ref 22–32)
Calcium: 9.1 mg/dL (ref 8.9–10.3)
Chloride: 105 mmol/L (ref 101–111)
Creatinine, Ser: 1.51 mg/dL — ABNORMAL HIGH (ref 0.61–1.24)
GFR calc Af Amer: 50 mL/min — ABNORMAL LOW (ref >60)
GFR, EST NON AFRICAN AMERICAN: 43 mL/min — AB (ref >60)
Glucose, Bld: 90 mg/dL (ref 65–99)
POTASSIUM: 4.8 mmol/L (ref 3.5–5.1)
SODIUM: 138 mmol/L (ref 135–145)

## 2015-09-27 NOTE — Progress Notes (Signed)
New evaluation for multiple myeloma. States is feeling well. Offers no complaints. 

## 2015-09-28 LAB — KAPPA/LAMBDA LIGHT CHAINS
Kappa free light chain: 59.9 mg/L — ABNORMAL HIGH (ref 3.3–19.4)
Kappa, lambda light chain ratio: 0.5 (ref 0.26–1.65)
Lambda free light chains: 119.8 mg/L — ABNORMAL HIGH (ref 5.7–26.3)

## 2015-09-28 LAB — PROTEIN ELECTRO, RANDOM URINE
ALBUMIN ELP UR: 82.3 %
ALPHA-1-GLOBULIN, U: 2.3 %
Alpha-2-Globulin, U: 2 %
BETA GLOBULIN, U: 6.9 %
GAMMA GLOBULIN, U: 6.6 %
Total Protein, Urine: 207.4 mg/dL

## 2015-09-28 LAB — IGG, IGA, IGM
IGA: 246 mg/dL (ref 61–437)
IGG (IMMUNOGLOBIN G), SERUM: 1127 mg/dL (ref 700–1600)
IgM, Serum: 65 mg/dL (ref 15–143)

## 2015-10-03 LAB — COMP PANEL: LEUKEMIA/LYMPHOMA

## 2015-10-05 ENCOUNTER — Telehealth: Payer: Self-pay | Admitting: *Deleted

## 2015-10-05 NOTE — Telephone Encounter (Signed)
Asking for a call back regarding lab results

## 2015-10-06 ENCOUNTER — Other Ambulatory Visit: Payer: Self-pay | Admitting: *Deleted

## 2015-10-06 DIAGNOSIS — R768 Other specified abnormal immunological findings in serum: Secondary | ICD-10-CM

## 2015-10-06 NOTE — Telephone Encounter (Signed)
Hassan Rowan spoke with patients wife, bone marrow biopsy to be scheduled in the next 1-2 weeks with MD follow up 1 week later.

## 2015-10-18 DIAGNOSIS — D2261 Melanocytic nevi of right upper limb, including shoulder: Secondary | ICD-10-CM | POA: Diagnosis not present

## 2015-10-18 DIAGNOSIS — Z85820 Personal history of malignant melanoma of skin: Secondary | ICD-10-CM | POA: Diagnosis not present

## 2015-10-18 DIAGNOSIS — L570 Actinic keratosis: Secondary | ICD-10-CM | POA: Diagnosis not present

## 2015-10-18 DIAGNOSIS — D485 Neoplasm of uncertain behavior of skin: Secondary | ICD-10-CM | POA: Diagnosis not present

## 2015-10-18 DIAGNOSIS — L821 Other seborrheic keratosis: Secondary | ICD-10-CM | POA: Diagnosis not present

## 2015-10-18 DIAGNOSIS — D225 Melanocytic nevi of trunk: Secondary | ICD-10-CM | POA: Diagnosis not present

## 2015-10-18 DIAGNOSIS — D0362 Melanoma in situ of left upper limb, including shoulder: Secondary | ICD-10-CM | POA: Diagnosis not present

## 2015-10-23 ENCOUNTER — Other Ambulatory Visit: Payer: Self-pay | Admitting: Radiology

## 2015-10-24 NOTE — Discharge Instructions (Signed)
Bone Marrow Aspiration and Bone Marrow Biopsy, Care After °Refer to this sheet in the next few weeks. These instructions provide you with information about caring for yourself after your procedure. Your health care provider may also give you more specific instructions. Your treatment has been planned according to current medical practices, but problems sometimes occur. Call your health care provider if you have any problems or questions after your procedure. °WHAT TO EXPECT AFTER THE PROCEDURE °After your procedure, it is common to have: °· Soreness or tenderness around the puncture site. °· Bruising. °HOME CARE INSTRUCTIONS °· Take medicines only as directed by your health care provider. °· Follow your health care provider's instructions about: °¨ Puncture site care. °¨ Bandage (dressing) changes and removal. °· Bathe and shower as directed by your health care provider. °· Check your puncture site every day for signs of infection. Watch for: °¨ Redness, swelling, or pain. °¨ Fluid, blood, or pus. °· Return to your normal activities as directed by your health care provider. °· Keep all follow-up visits as directed by your health care provider. This is important. °SEEK MEDICAL CARE IF: °· You have a fever. °· You have uncontrollable bleeding. °· You have redness, swelling, or pain at the site of your puncture. °· You have fluid, blood, or pus coming from your puncture site. °  °This information is not intended to replace advice given to you by your health care provider. Make sure you discuss any questions you have with your health care provider. °  °Document Released: 07/07/2004 Document Revised: 05/04/2014 Document Reviewed: 12/09/2013 °Elsevier Interactive Patient Education ©2016 Elsevier Inc. ° °

## 2015-10-25 ENCOUNTER — Encounter: Payer: Self-pay | Admitting: Oncology

## 2015-10-25 ENCOUNTER — Ambulatory Visit
Admission: RE | Admit: 2015-10-25 | Discharge: 2015-10-25 | Disposition: A | Discharge status: Home / Self Care | Payer: PPO | Source: Ambulatory Visit | Attending: Oncology | Admitting: Oncology

## 2015-10-25 DIAGNOSIS — R768 Other specified abnormal immunological findings in serum: Secondary | ICD-10-CM

## 2015-10-25 DIAGNOSIS — D649 Anemia, unspecified: Secondary | ICD-10-CM | POA: Diagnosis not present

## 2015-10-25 DIAGNOSIS — D472 Monoclonal gammopathy: Secondary | ICD-10-CM | POA: Insufficient documentation

## 2015-10-25 HISTORY — DX: Cardiac arrhythmia, unspecified: I49.9

## 2015-10-25 HISTORY — DX: Chronic kidney disease, unspecified: N18.9

## 2015-10-25 LAB — CBC WITH DIFFERENTIAL/PLATELET
Basophils Absolute: 0 10*3/uL (ref 0–0.1)
Basophils Relative: 0 %
EOS ABS: 0.2 10*3/uL (ref 0–0.7)
Eosinophils Relative: 4 %
HEMATOCRIT: 37.6 % — AB (ref 40.0–52.0)
HEMOGLOBIN: 13.3 g/dL (ref 13.0–18.0)
LYMPHS ABS: 0.9 10*3/uL — AB (ref 1.0–3.6)
LYMPHS PCT: 19 %
MCH: 34.1 pg — ABNORMAL HIGH (ref 26.0–34.0)
MCHC: 35.3 g/dL (ref 32.0–36.0)
MCV: 96.6 fL (ref 80.0–100.0)
Monocytes Absolute: 0.3 10*3/uL (ref 0.2–1.0)
Monocytes Relative: 7 %
NEUTROS ABS: 3.1 10*3/uL (ref 1.4–6.5)
NEUTROS PCT: 70 %
Platelets: 164 10*3/uL (ref 150–440)
RBC: 3.9 MIL/uL — AB (ref 4.40–5.90)
RDW: 13.2 % (ref 11.5–14.5)
WBC: 4.5 10*3/uL (ref 3.8–10.6)

## 2015-10-25 LAB — PROTIME-INR
INR: 1.12
PROTHROMBIN TIME: 14.5 s (ref 11.4–15.2)

## 2015-10-25 MED ORDER — SODIUM CHLORIDE 0.9 % IV SOLN
INTRAVENOUS | Status: DC
  Administered 2015-10-25: 09:00:00 via INTRAVENOUS

## 2015-10-25 MED ORDER — MIDAZOLAM HCL 2 MG/2ML IJ SOLN
INTRAMUSCULAR | Status: AC | PRN
  Administered 2015-10-25 (×2): 1 mg via INTRAVENOUS

## 2015-10-25 MED ORDER — FENTANYL CITRATE (PF) 100 MCG/2ML IJ SOLN
INTRAMUSCULAR | Status: AC | PRN
  Administered 2015-10-25 (×2): 50 ug via INTRAVENOUS

## 2015-10-25 NOTE — H&P (Signed)
Chief Complaint: Patient was seen in consultation today for No chief complaint on file.  at the request of Finnegan,Timothy J  Referring Physician(s): Finnegan,Timothy J  Supervising Physician: Marybelle Killings  Patient Status: [facility] - Out-pt  History of Present Illness: Lawrence Boody. is a 75 y.o. male with anemia and abnormal BM signal on lumbar MRI.   Past Medical History:  Diagnosis Date  . Chronic kidney disease   . Diverticulosis   . Dysrhythmia   . Gout   . Melanoma ([place])    status post resection from his back  . Prostatism   . Rectal bleeding     Past Surgical History:  Procedure Laterality Date  . ANKLE SURGERY     following volleyball injury  . APPENDECTOMY    . TONSILLECTOMY    . TOTAL HIP ARTHROPLASTY  09/2009   Right femoral neck fracture    Allergies: Review of patient's allergies indicates no known allergies.  Medications: Prior to Admission medications   Medication Sig Start Date End Date Taking? Authorizing Provider  alendronate (FOSAMAX) 70 MG tablet Take 70 mg by mouth once a week. Take with a full glass of water on an empty stomach.   Yes Historical Provider, MD  Cholecalciferol (VITAMIN D3) 2000 units TABS Take 1 tablet by mouth daily.   Yes Historical Provider, MD  metoprolol (LOPRESSOR) 50 MG tablet Take 50 mg by mouth daily.   Yes Historical Provider, MD  tamsulosin (FLOMAX) 0.4 MG CAPS capsule Take 0.4 mg by mouth daily.   Yes Historical Provider, MD  aspirin EC 81 MG tablet Take 81 mg by mouth daily.    Historical Provider, MD     Family History  Problem Relation Age of Onset  . Cirrhosis Mother   . Other Father     Lung Fibrosis    Social History   Social History  . Marital status: Married    Spouse name: N/A  . Number of children: N/A  . Years of education: N/A   Occupational History  . Retired    Social History Main Topics  . Smoking status: Former Smoker    Years: 15.00  . Smokeless tobacco: Never Used  .  Alcohol use None  . Drug use: No  . Sexual activity: Not Asked   Other Topics Concern  . None   Social History Narrative   Regular exercise: Yes     Review of Systems: A 12 point ROS discussed and pertinent positives are indicated in the HPI above.  All other systems are negative.  Review of Systems  Vital Signs: BP (!) 175/73   Pulse (!) 48   SpO2 96%   Physical Exam  Constitutional: He is oriented to person, place, and time. He appears well-developed and well-nourished.  HENT:  Head: Normocephalic and atraumatic.  Cardiovascular: Normal rate and regular rhythm.   Pulmonary/Chest: Effort normal and breath sounds normal.  Musculoskeletal: Normal range of motion.  Neurological: He is alert and oriented to person, place, and time.    Mallampati Score:   1  Imaging: No results found.  Labs:  CBC:  Recent Labs  09/27/15 1600 10/25/15 0811  WBC 5.3 4.5  HGB 14.4 13.3  HCT 42.6 37.6*  PLT 199 164    COAGS: No results for input(s): INR, APTT in the last 8760 hours.  BMP:  Recent Labs  09/27/15 1600  NA 138  K 4.8  CL 105  CO2 25  GLUCOSE 90  BUN  35*  CALCIUM 9.1  CREATININE 1.51*  GFRNONAA 43*  GFRAA 50*    LIVER FUNCTION TESTS: No results for input(s): BILITOT, AST, ALT, ALKPHOS, PROT, ALBUMIN in the last 8760 hours.  TUMOR MARKERS: No results for input(s): AFPTM, CEA, CA199, CHROMGRNA in the last 8760 hours.  Assessment and Plan:  Abnormal bone marrow signal and hsitory of anemia. Bone marrow biopsy to follow.   Electronically Signed: Kirsty Monjaraz, ART A 10/25/2015, 9:03 AM   I spent a total of  30 Minutes   in face to face in clinical consultation, greater than 50% of which was counseling/coordinating care for bone marrow biopsy.

## 2015-10-25 NOTE — OR Nursing (Signed)
Noted rash all over back which was there pre procedure, however increased raised area lower back where prep cleaner applied, however pt denies itching, has lessened after washed off. Showed wife the rash will continue to closely monitor.

## 2015-10-25 NOTE — Procedures (Signed)
R iliac BM Bx No comp/EBL

## 2015-10-26 DIAGNOSIS — L905 Scar conditions and fibrosis of skin: Secondary | ICD-10-CM | POA: Diagnosis not present

## 2015-10-26 DIAGNOSIS — D0359 Melanoma in situ of other part of trunk: Secondary | ICD-10-CM | POA: Diagnosis not present

## 2015-10-31 DIAGNOSIS — D472 Monoclonal gammopathy: Secondary | ICD-10-CM | POA: Insufficient documentation

## 2015-10-31 NOTE — Progress Notes (Signed)
[facility]  [phone] [phone]  ID: [name] OB: [birth year]  MR#: [id number]  HFW#:[id number]  Patient Care Team: Kirk Ruths, MD as PCP - General (Internal Medicine)  CHIEF COMPLAINT: MGUS.  INTERVAL HISTORY: Patient returns to clinic today for further evaluation and discussion of his bone marrow biopsy results. He currently feels well and is asymptomatic. He has no neurologic complaints. He denies any recent fevers or illnesses. He denies any other pain. He has a good appetite and denies weight loss. He has no neurologic complaints. He denies any night sweats. He has no chest pain or shortness of breath. He denies any nausea, vomiting, constipation, or diarrhea. He has no urinary complaints. Patient offers no specific complaints today.  REVIEW OF SYSTEMS:   Review of Systems  Constitutional: Negative.  Negative for fever, malaise/fatigue and weight loss.  Respiratory: Negative.  Negative for cough and shortness of breath.   Cardiovascular: Negative.  Negative for chest pain and leg swelling.  Gastrointestinal: Negative.  Negative for abdominal pain.  Genitourinary: Negative.   Musculoskeletal: Positive for back pain. Negative for falls.  Neurological: Negative.  Negative for weakness.  Endo/Heme/Allergies: Does not bruise/bleed easily.  Psychiatric/Behavioral: Negative.  The patient is not nervous/anxious.     As per HPI. Otherwise, a complete review of systems is negative.  PAST MEDICAL HISTORY: Past Medical History:  Diagnosis Date  . Chronic kidney disease   . Diverticulosis   . Dysrhythmia   . Gout   . Melanoma ([place])    status post resection from his back  . Prostatism   . Rectal bleeding     PAST SURGICAL HISTORY: Past Surgical History:  Procedure Laterality Date  . ANKLE SURGERY     following volleyball injury  . APPENDECTOMY    . TONSILLECTOMY    . TOTAL HIP ARTHROPLASTY  09/2009   Right femoral  neck fracture    FAMILY HISTORY: Family History  Problem Relation Age of Onset  . Cirrhosis Mother   . Other Father     Lung Fibrosis    ADVANCED DIRECTIVES (Y/N):  N  HEALTH MAINTENANCE: Social History  Substance Use Topics  . Smoking status: Former Smoker    Years: 15.00  . Smokeless tobacco: Never Used  . Alcohol use Not on file     Colonoscopy:  PAP:  Bone density:  Lipid panel:  No Known Allergies  Current Outpatient Prescriptions  Medication Sig Dispense Refill  . alendronate (FOSAMAX) 70 MG tablet Take 70 mg by mouth once a week. Take with a full glass of water on an empty stomach.    [name] aspirin EC 81 MG tablet Take 81 mg by mouth daily.    . Cholecalciferol (VITAMIN D3) 2000 units TABS Take 1 tablet by mouth daily.    . metoprolol (LOPRESSOR) 50 MG tablet Take 50 mg by mouth daily.    . tamsulosin (FLOMAX) 0.4 MG CAPS capsule Take 0.4 mg by mouth daily.     No current facility-administered medications for this visit.     OBJECTIVE: Vitals:   11/01/15 1458  BP: (!) 198/80  Pulse: (!) 56  Resp: 18  Temp: 98.3 F (36.8 C)     Body mass index is 28.99 kg/m.    ECOG FS:0 - Asymptomatic  General: Well-developed, well-nourished, no acute distress. Eyes: Pink conjunctiva, anicteric sclera. HEENT: Normocephalic, moist mucous membranes, clear oropharnyx. Lungs: Clear to auscultation bilaterally. Heart: Regular rate and rhythm. No rubs, murmurs, or  gallops. Abdomen: Soft, nontender, nondistended. No organomegaly noted, normoactive bowel sounds. Musculoskeletal: No edema, cyanosis, or clubbing. Neuro: Alert, answering all questions appropriately. Cranial nerves grossly intact. Skin: No rashes or petechiae noted. Psych: Normal affect. Lymphatics: No cervical, calvicular, axillary or inguinal LAD.   LAB RESULTS:  Lab Results  Component Value Date   NA 138 09/27/2015   K 4.8 09/27/2015   CL 105 09/27/2015   CO2 25 09/27/2015   GLUCOSE 90 09/27/2015    BUN 35 (H) 09/27/2015   CREATININE 1.51 (H) 09/27/2015   CALCIUM 9.1 09/27/2015   GFRNONAA 43 (L) 09/27/2015   GFRAA 50 (L) 09/27/2015    Lab Results  Component Value Date   WBC 4.5 04-Nov-2015   NEUTROABS 3.1 November 04, 2015   HGB 13.3 11-04-15   HCT 37.6 (L) 11-04-2015   MCV 96.6 November 04, 2015   PLT 164 11/04/2015     STUDIES: Ct Biopsy  Result Date: 11-04-2015 INDICATION: Anemia EXAM: CT BIOPSY MEDICATIONS: None. ANESTHESIA/SEDATION: Fentanyl 100 mcg IV; Versed 2 mg IV Moderate Sedation Time:  15 The patient was continuously monitored during the procedure by the interventional radiology nurse under my direct supervision. FLUOROSCOPY TIME:  Fluoroscopy Time:  minutes  seconds ( mGy). COMPLICATIONS: None immediate. PROCEDURE: Informed written consent was obtained from the patient after a thorough discussion of the procedural risks, benefits and alternatives. All questions were addressed. Maximal Sterile Barrier Technique was utilized including caps, mask, sterile gowns, sterile gloves, sterile drape, hand hygiene and skin antiseptic. A timeout was performed prior to the initiation of the procedure. Under CT guidance, an 11 gauge needle was advanced into the right iliac bone via posterior approach. Aspirates and a core were obtained. Post biopsy images demonstrate no hemorrhage. Patient tolerated the procedure well without complication. Vital sign monitoring by nursing staff during the procedure will continue as patient is in the special procedures unit for post procedure observation. FINDINGS: The images document guide needle placement within the right iliac bone. Post biopsy images demonstrate no hemorrhage. IMPRESSION: Successful right iliac bone marrow aspirate and core. Electronically Signed   By: Marybelle Killings M.D.   On: 11/04/2015 10:53    ASSESSMENT: MGUS  PLAN:    1. MGUS:  Patient's bone marrow biopsy reviewed with only a mild increased plasma cells of approximately 5-10%. Patient was  also noted to have cytogenetic abnormality of a monosomy 73 which is an average risk of multiple myeloma. His recent M spike was 0.6. Given his bone marrow results, patient is possibly at higher risk for progressing to multiple myeloma. Patient will require a metastatic bone survey in the future. Return to clinic in 3 months with repeat laboratory work and further evaluation. 3. Renal insufficiency: Patient's creatinine is mildly elevated, it unclear his baseline. Monitor.   Approximately 30 minutes was spent in discussion of which greater than 50% was consultation.   Patient expressed understanding and was in agreement with this plan. He also understands that He can call clinic at any time with any questions, concerns, or complaints.    Lloyd Huger, MD   11/02/2015 10:14 AM

## 2015-11-01 ENCOUNTER — Inpatient Hospital Stay: Payer: PPO | Attending: Oncology | Admitting: Oncology

## 2015-11-01 DIAGNOSIS — D472 Monoclonal gammopathy: Secondary | ICD-10-CM

## 2015-11-01 NOTE — Progress Notes (Signed)
States is feeling well. Offers no complaints. 

## 2015-11-14 DIAGNOSIS — Z0000 Encounter for general adult medical examination without abnormal findings: Secondary | ICD-10-CM | POA: Diagnosis not present

## 2015-11-14 DIAGNOSIS — E7800 Pure hypercholesterolemia, unspecified: Secondary | ICD-10-CM | POA: Diagnosis not present

## 2015-11-14 DIAGNOSIS — N183 Chronic kidney disease, stage 3 (moderate): Secondary | ICD-10-CM | POA: Diagnosis not present

## 2015-11-14 DIAGNOSIS — M810 Age-related osteoporosis without current pathological fracture: Secondary | ICD-10-CM | POA: Diagnosis not present

## 2015-11-14 DIAGNOSIS — D472 Monoclonal gammopathy: Secondary | ICD-10-CM | POA: Diagnosis not present

## 2015-11-14 DIAGNOSIS — I129 Hypertensive chronic kidney disease with stage 1 through stage 4 chronic kidney disease, or unspecified chronic kidney disease: Secondary | ICD-10-CM | POA: Diagnosis not present

## 2015-11-14 DIAGNOSIS — I4891 Unspecified atrial fibrillation: Secondary | ICD-10-CM | POA: Diagnosis not present

## 2015-12-28 ENCOUNTER — Other Ambulatory Visit: Payer: PPO

## 2016-01-04 ENCOUNTER — Ambulatory Visit: Payer: PPO | Admitting: Oncology

## 2016-01-26 ENCOUNTER — Encounter: Payer: Self-pay | Admitting: Urology

## 2016-01-26 ENCOUNTER — Ambulatory Visit: Payer: PPO | Admitting: Urology

## 2016-01-26 VITALS — BP 219/94 | HR 60 | Ht 71.0 in | Wt 206.9 lb

## 2016-01-26 DIAGNOSIS — N401 Enlarged prostate with lower urinary tract symptoms: Secondary | ICD-10-CM | POA: Diagnosis not present

## 2016-01-26 DIAGNOSIS — I10 Essential (primary) hypertension: Secondary | ICD-10-CM | POA: Diagnosis not present

## 2016-01-26 DIAGNOSIS — N138 Other obstructive and reflux uropathy: Secondary | ICD-10-CM | POA: Diagnosis not present

## 2016-01-26 LAB — BLADDER SCAN AMB NON-IMAGING: SCAN RESULT: 12

## 2016-01-26 MED ORDER — TAMSULOSIN HCL 0.4 MG PO CAPS: 0.4000 mg | ORAL_CAPSULE | Freq: Every day | ORAL | 3 refills | Status: DC

## 2016-01-26 NOTE — Progress Notes (Signed)
01/26/2016 2:20 PM   Lawrence Perkins. May 23, 1940 [id number]  Referring provider: Kirk Ruths, MD [place], [place] [address]  Chief Complaint  Patient presents with  . New Patient (Initial Visit)    transfer care from [name]    HPI: Patient is a 76 year old Caucasian male with BPH with LU TS who is a former patient of [name] who would like to transfer his care to a local urologist.   BPH WITH LUTS His IPSS score today is 8, which is moderate lower urinary tract symptomatology. He is mostly satisfied with his quality life due to his urinary symptoms.      His major complaint today frequency and nocturia.   He has had these symptoms for several years.  He denies any dysuria, hematuria or suprapubic pain.   He currently taking tamsulosin 0.4 mg daily.  He also denies any recent fevers, chills, nausea or vomiting.  He does not have a family history of PCa.      IPSS    Row Name 01/26/16 1400         International Prostate Symptom Score   How often have you had the sensation of not emptying your bladder? Less than half the time     How often have you had to urinate less than every two hours? Less than half the time     How often have you found you stopped and started again several times when you urinated? Not at All     How often have you found it difficult to postpone urination? Not at All     How often have you had a weak urinary stream? About half the time     How often have you had to strain to start urination? Not at All     How many times did you typically get up at night to urinate? 1 Time     Total IPSS Score 8       Quality of Life due to urinary symptoms   If you were to spend the rest of your life with your urinary condition just the way it is now how would you feel about that? Mostly Satisfied        Score:  1-7 Mild 8-19 Moderate 20-35 Severe      PMH: Past Medical History:  Diagnosis  Date  . Chronic kidney disease   . Diverticulosis   . Dysrhythmia   . Gout   . Melanoma ([place])    status post resection from his back  . Prostatism   . Rectal bleeding     Surgical History: Past Surgical History:  Procedure Laterality Date  . ANKLE SURGERY     following volleyball injury  . APPENDECTOMY    . TONSILLECTOMY    . TOTAL HIP ARTHROPLASTY  09/2009   Right femoral neck fracture    Home Medications:  Allergies as of 01/26/2016   No Known Allergies     Medication List       Accurate as of 01/26/16  2:20 PM. Always use your most recent med list.          alendronate 70 MG tablet Commonly known as:  FOSAMAX Take 70 mg by mouth once a week. Take with a full glass of water on an empty stomach.   aspirin EC 81 MG tablet Take 81 mg by mouth daily.   metoprolol 50 MG tablet Commonly  known as:  LOPRESSOR Take 50 mg by mouth daily.   tamsulosin 0.4 MG Caps capsule Commonly known as:  FLOMAX Take 1 capsule (0.4 mg total) by mouth daily.   Vitamin D3 2000 units Tabs Take 1 tablet by mouth daily.       Allergies: No Known Allergies  Family History: Family History  Problem Relation Age of Onset  . Cirrhosis Mother   . Other Father     Lung Fibrosis  . Prostate cancer Neg Hx   . Kidney cancer Neg Hx   . Bladder Cancer Neg Hx     Social History:  reports that he quit smoking about 35 years ago. He quit after 15.00 years of use. He has never used smokeless tobacco. He reports that he drinks about 1.2 oz of alcohol per week . He reports that he does not use drugs.  ROS: UROLOGY Frequent Urination?: Yes Hard to postpone urination?: No Burning/pain with urination?: No Get up at night to urinate?: Yes Leakage of urine?: No Urine stream starts and stops?: No Trouble starting stream?: No Do you have to strain to urinate?: No Blood in urine?: No Urinary tract infection?: No Sexually transmitted disease?: No Injury to kidneys or bladder?: No Painful  intercourse?: No Weak stream?: Yes Erection problems?: No Penile pain?: No  Gastrointestinal Nausea?: No Vomiting?: No Indigestion/heartburn?: No Diarrhea?: No Constipation?: No  Constitutional Fever: No Night sweats?: No Weight loss?: No Fatigue?: No  Skin Skin rash/lesions?: Yes Itching?: No  Eyes Blurred vision?: No Double vision?: No  Ears/Nose/Throat Sore throat?: No Sinus problems?: No  Hematologic/Lymphatic Swollen glands?: No Easy bruising?: No  Cardiovascular Leg swelling?: No Chest pain?: No  Respiratory Cough?: No Shortness of breath?: Yes  Endocrine Excessive thirst?: No  Musculoskeletal Back pain?: No Joint pain?: No  Neurological Headaches?: No Dizziness?: No  Psychologic Depression?: No Anxiety?: No  Physical Exam: BP (!) 205/93   Pulse 64   Ht 5\' 11"  (1.803 m)   Wt 206 lb 14.4 oz (93.8 kg)   BMI 28.86 kg/m   Constitutional: Well nourished. Alert and oriented, No acute distress. HEENT: [place] AT, moist mucus membranes. Trachea midline, no masses. Cardiovascular: No clubbing, cyanosis, or edema. Respiratory: Normal respiratory effort, no increased work of breathing. GI: Abdomen is soft, non tender, non distended, no abdominal masses. Liver and spleen not palpable.  No hernias appreciated.  Stool sample for occult testing is not indicated.   GU: No CVA tenderness.  No bladder fullness or masses.  Patient with circumcised phallus.   Urethral meatus is patent.  No penile discharge. No penile lesions or rashes. Scrotum without lesions, cysts, rashes and/or edema.  Testicles are located scrotally bilaterally. No masses are appreciated in the testicles. Left and right epididymis are normal. Rectal: Patient with  normal sphincter tone. Anus and perineum without scarring or rashes. No rectal masses are appreciated. Prostate is approximately 50 grams, no nodules are appreciated. Seminal vesicles are normal. Skin: No rashes, bruises or  suspicious lesions. Lymph: No cervical or inguinal adenopathy. Neurologic: Grossly intact, no focal deficits, moving all 4 extremities. Psychiatric: Normal mood and affect.  Laboratory Data: Lab Results  Component Value Date   WBC 4.5 10/25/2015   HGB 13.3 10/25/2015   HCT 37.6 (L) 10/25/2015   MCV 96.6 10/25/2015   PLT 164 10/25/2015    Lab Results  Component Value Date   CREATININE 1.51 (H) 09/27/2015     Assessment & Plan:    1. BPH with LUTS  -  IPSS score is 8/2  - Continue conservative management, avoiding bladder irritants and timed voiding's  - Continue tamsulosin 0.4 mg daily; :refills given  - RTC in 12 months for IPSS and exam   2. Uncontrolled HTN  - contacted Dr. Alphonzo Lemmings office and they will see him in the am  - signs of stroke are reviewed with the patient and he is instructed to seek emergent treatment if he experiencing any of those symptoms  Return in about 1 year (around 01/25/2017) for IPSS and exam.  These notes generated with voice recognition software. I apologize for typographical errors.  Zara Council, [facility] [address] [place], [place] [address] [phone]

## 2016-01-27 DIAGNOSIS — N183 Chronic kidney disease, stage 3 (moderate): Secondary | ICD-10-CM | POA: Diagnosis not present

## 2016-01-27 DIAGNOSIS — I129 Hypertensive chronic kidney disease with stage 1 through stage 4 chronic kidney disease, or unspecified chronic kidney disease: Secondary | ICD-10-CM | POA: Diagnosis not present

## 2016-01-27 DIAGNOSIS — I4891 Unspecified atrial fibrillation: Secondary | ICD-10-CM | POA: Diagnosis not present

## 2016-01-31 ENCOUNTER — Inpatient Hospital Stay: Payer: PPO | Attending: Oncology

## 2016-01-31 DIAGNOSIS — Z96641 Presence of right artificial hip joint: Secondary | ICD-10-CM | POA: Diagnosis not present

## 2016-01-31 DIAGNOSIS — N289 Disorder of kidney and ureter, unspecified: Secondary | ICD-10-CM | POA: Diagnosis not present

## 2016-01-31 DIAGNOSIS — D649 Anemia, unspecified: Secondary | ICD-10-CM | POA: Diagnosis not present

## 2016-01-31 DIAGNOSIS — N400 Enlarged prostate without lower urinary tract symptoms: Secondary | ICD-10-CM | POA: Insufficient documentation

## 2016-01-31 DIAGNOSIS — Z87891 Personal history of nicotine dependence: Secondary | ICD-10-CM | POA: Diagnosis not present

## 2016-01-31 DIAGNOSIS — Z7982 Long term (current) use of aspirin: Secondary | ICD-10-CM | POA: Insufficient documentation

## 2016-01-31 DIAGNOSIS — Z79899 Other long term (current) drug therapy: Secondary | ICD-10-CM | POA: Diagnosis not present

## 2016-01-31 DIAGNOSIS — M109 Gout, unspecified: Secondary | ICD-10-CM | POA: Insufficient documentation

## 2016-01-31 DIAGNOSIS — D472 Monoclonal gammopathy: Secondary | ICD-10-CM

## 2016-01-31 LAB — BASIC METABOLIC PANEL
Anion gap: 6 (ref 5–15)
BUN: 29 mg/dL — AB (ref 6–20)
CHLORIDE: 105 mmol/L (ref 101–111)
CO2: 27 mmol/L (ref 22–32)
CREATININE: 1.66 mg/dL — AB (ref 0.61–1.24)
Calcium: 8.4 mg/dL — ABNORMAL LOW (ref 8.9–10.3)
GFR calc Af Amer: 45 mL/min — ABNORMAL LOW (ref >60)
GFR calc non Af Amer: 38 mL/min — ABNORMAL LOW (ref >60)
Glucose, Bld: 143 mg/dL — ABNORMAL HIGH (ref 65–99)
POTASSIUM: 4.1 mmol/L (ref 3.5–5.1)
SODIUM: 138 mmol/L (ref 135–145)

## 2016-01-31 LAB — CBC WITH DIFFERENTIAL/PLATELET
Basophils Absolute: 0 10*3/uL (ref 0–0.1)
Basophils Relative: 0 %
Eosinophils Absolute: 0.2 10*3/uL (ref 0–0.7)
Eosinophils Relative: 4 %
HEMATOCRIT: 41.1 % (ref 40.0–52.0)
HEMOGLOBIN: 14.2 g/dL (ref 13.0–18.0)
LYMPHS ABS: 0.9 10*3/uL — AB (ref 1.0–3.6)
Lymphocytes Relative: 18 %
MCH: 33 pg (ref 26.0–34.0)
MCHC: 34.5 g/dL (ref 32.0–36.0)
MCV: 95.9 fL (ref 80.0–100.0)
MONOS PCT: 5 %
Monocytes Absolute: 0.2 10*3/uL (ref 0.2–1.0)
NEUTROS ABS: 3.7 10*3/uL (ref 1.4–6.5)
NEUTROS PCT: 73 %
Platelets: 201 10*3/uL (ref 150–440)
RBC: 4.29 MIL/uL — AB (ref 4.40–5.90)
RDW: 13.6 % (ref 11.5–14.5)
WBC: 5.1 10*3/uL (ref 3.8–10.6)

## 2016-02-01 LAB — PROTEIN ELECTROPHORESIS, SERUM
A/G RATIO SPE: 1.5 (ref 0.7–1.7)
ALBUMIN ELP: 3.2 g/dL (ref 2.9–4.4)
Alpha-1-Globulin: 0.2 g/dL (ref 0.0–0.4)
Alpha-2-Globulin: 0.5 g/dL (ref 0.4–1.0)
Beta Globulin: 0.8 g/dL (ref 0.7–1.3)
GAMMA GLOBULIN: 0.7 g/dL (ref 0.4–1.8)
Globulin, Total: 2.2 g/dL (ref 2.2–3.9)
M-Spike, %: 0.3 g/dL — ABNORMAL HIGH
TOTAL PROTEIN ELP: 5.4 g/dL — AB (ref 6.0–8.5)

## 2016-02-01 LAB — PROTEIN ELECTRO, RANDOM URINE
Albumin ELP, Urine: 71.2 %
Alpha-1-Globulin, U: 7 %
Alpha-2-Globulin, U: 3.5 %
Beta Globulin, U: 10.4 %
GAMMA GLOBULIN, U: 7.9 %
M SPIKE UR: 2.6 % — AB
Total Protein, Urine: 786.8 mg/dL

## 2016-02-01 LAB — KAPPA/LAMBDA LIGHT CHAINS
KAPPA, LAMDA LIGHT CHAIN RATIO: 0.64 (ref 0.26–1.65)
Kappa free light chain: 63.9 mg/L — ABNORMAL HIGH (ref 3.3–19.4)
Lambda free light chains: 99.9 mg/L — ABNORMAL HIGH (ref 5.7–26.3)

## 2016-02-06 NOTE — Progress Notes (Signed)
[name]  Telephone:([phone] [phone]  ID: Lawrence Perkins. OB: [birth year]  MR#: [id number]  CXK#:[id number]  Patient Care Team: Kirk Ruths, MD as PCP - General (Internal Medicine)  CHIEF COMPLAINT: MGUS.  INTERVAL HISTORY: Patient returns to clinic today for repeat laboratory work and further evaluation. He currently feels well and is asymptomatic. He has no neurologic complaints. He denies any recent fevers or illnesses. He denies any pain. He has a good appetite and denies weight loss. He has no neurologic complaints. He denies any night sweats. He has no chest pain or shortness of breath. He denies any nausea, vomiting, constipation, or diarrhea. He has no urinary complaints. Patient offers no specific complaints today.  REVIEW OF SYSTEMS:   Review of Systems  Constitutional: Negative.  Negative for fever, malaise/fatigue and weight loss.  Respiratory: Negative.  Negative for cough and shortness of breath.   Cardiovascular: Negative.  Negative for chest pain and leg swelling.  Gastrointestinal: Negative.  Negative for abdominal pain.  Genitourinary: Negative.   Musculoskeletal: Positive for back pain. Negative for falls.  Neurological: Negative.  Negative for weakness.  Endo/Heme/Allergies: Does not bruise/bleed easily.  Psychiatric/Behavioral: Negative.  The patient is not nervous/anxious.     As per HPI. Otherwise, a complete review of systems is negative.  PAST MEDICAL HISTORY: Past Medical History:  Diagnosis Date  . Chronic kidney disease   . Diverticulosis   . Dysrhythmia   . Gout   . Melanoma ([place])    status post resection from his back  . Prostatism   . Rectal bleeding     PAST SURGICAL HISTORY: Past Surgical History:  Procedure Laterality Date  . ANKLE SURGERY     following volleyball injury  . APPENDECTOMY    . TONSILLECTOMY    . TOTAL HIP ARTHROPLASTY  09/2009   Right femoral neck fracture    FAMILY  HISTORY: Family History  Problem Relation Age of Onset  . Cirrhosis Mother   . Other Father     Lung Fibrosis  . Prostate cancer Neg Hx   . Kidney cancer Neg Hx   . Bladder Cancer Neg Hx     ADVANCED DIRECTIVES (Y/N):  N  HEALTH MAINTENANCE: Social History  Substance Use Topics  . Smoking status: Former Smoker    Years: 15.00    Quit date: 01/01/1981  . Smokeless tobacco: Never Used  . Alcohol use 1.2 oz/week    2 Cans of beer per week     Colonoscopy:  PAP:  Bone density:  Lipid panel:  No Known Allergies  Current Outpatient Prescriptions  Medication Sig Dispense Refill  . alendronate (FOSAMAX) 70 MG tablet Take 70 mg by mouth once a week. Take with a full glass of water on an empty stomach.    [name] aspirin EC 81 MG tablet Take 81 mg by mouth daily.    . carvedilol (COREG) 12.5 MG tablet Take 12.5 mg by mouth 2 (two) times daily.     . Cholecalciferol (VITAMIN D3) 2000 units TABS Take 1 tablet by mouth daily.    . tamsulosin (FLOMAX) 0.4 MG CAPS capsule Take 1 capsule (0.4 mg total) by mouth daily. 90 capsule 3   No current facility-administered medications for this visit.     OBJECTIVE: Vitals:   02/07/16 1455  BP: (!) 197/92  Pulse: 64  Resp: 18  Temp: 97.5 F (36.4 C)     Body mass index is 28.5 kg/m.  ECOG FS:0 - Asymptomatic  General: Well-developed, well-nourished, no acute distress. Eyes: Pink conjunctiva, anicteric sclera. Lungs: Clear to auscultation bilaterally. Heart: Regular rate and rhythm. No rubs, murmurs, or gallops. Abdomen: Soft, nontender, nondistended. No organomegaly noted, normoactive bowel sounds. Musculoskeletal: No edema, cyanosis, or clubbing. Neuro: Alert, answering all questions appropriately. Cranial nerves grossly intact. Skin: No rashes or petechiae noted. Psych: Normal affect.   LAB RESULTS:  Lab Results  Component Value Date   NA 138 01/31/2016   K 4.1 01/31/2016   CL 105 01/31/2016   CO2 27 01/31/2016   GLUCOSE  143 (H) 01/31/2016   BUN 29 (H) 01/31/2016   CREATININE 1.66 (H) 01/31/2016   CALCIUM 8.4 (L) 01/31/2016   GFRNONAA 38 (L) 01/31/2016   GFRAA 45 (L) 01/31/2016    Lab Results  Component Value Date   WBC 5.1 01/31/2016   NEUTROABS 3.7 01/31/2016   HGB 14.2 01/31/2016   HCT 41.1 01/31/2016   MCV 95.9 01/31/2016   PLT 201 01/31/2016   Lab Results  Component Value Date   TOTALPROTELP 5.4 (L) 01/31/2016   ALBUMINELP 3.2 01/31/2016   A1GS 0.2 01/31/2016   A2GS 0.5 01/31/2016   BETS 0.8 01/31/2016   GAMS 0.7 01/31/2016   MSPIKE 0.3 (H) 01/31/2016   SPEI Comment 01/31/2016     STUDIES: No results found.  ASSESSMENT: MGUS  PLAN:    1. MGUS:  Patient's bone marrow biopsy reviewed with only a mild increased plasma cells of approximately 5-10%. Patient was also noted to have cytogenetic abnormality of a monosomy 13 which is an average risk of multiple myeloma. His recent M spike is slightly decreased at 0.3. Given his bone marrow results, patient is possibly at higher risk for progressing to multiple myeloma. We will consider a metastatic bone survey in the future if there is suspicion of progression of disease. Return to clinic in 6 months with repeat laboratory work and further evaluation. 2. Renal insufficiency: Patient's creatinine is mildly elevated, it unclear his baseline. Monitor.    Patient expressed understanding and was in agreement with this plan. He also understands that He can call clinic at any time with any questions, concerns, or complaints.    Timothy J Finnegan, MD   02/12/2016 7:54 AM     

## 2016-02-07 ENCOUNTER — Inpatient Hospital Stay: Payer: PPO | Attending: Oncology | Admitting: Oncology

## 2016-02-07 VITALS — BP 197/92 | HR 64 | Temp 97.5°F | Resp 18 | Wt 204.4 lb

## 2016-02-07 DIAGNOSIS — Z7982 Long term (current) use of aspirin: Secondary | ICD-10-CM | POA: Diagnosis not present

## 2016-02-07 DIAGNOSIS — Z85820 Personal history of malignant melanoma of skin: Secondary | ICD-10-CM | POA: Diagnosis not present

## 2016-02-07 DIAGNOSIS — D472 Monoclonal gammopathy: Secondary | ICD-10-CM | POA: Diagnosis not present

## 2016-02-07 DIAGNOSIS — N189 Chronic kidney disease, unspecified: Secondary | ICD-10-CM | POA: Insufficient documentation

## 2016-02-07 DIAGNOSIS — Z87891 Personal history of nicotine dependence: Secondary | ICD-10-CM | POA: Diagnosis not present

## 2016-02-07 DIAGNOSIS — M109 Gout, unspecified: Secondary | ICD-10-CM | POA: Insufficient documentation

## 2016-02-07 NOTE — Progress Notes (Signed)
Patient is here today for follow up, he is doing well

## 2016-02-09 DIAGNOSIS — Z85820 Personal history of malignant melanoma of skin: Secondary | ICD-10-CM | POA: Diagnosis not present

## 2016-02-09 DIAGNOSIS — X32XXXA Exposure to sunlight, initial encounter: Secondary | ICD-10-CM | POA: Diagnosis not present

## 2016-02-09 DIAGNOSIS — D225 Melanocytic nevi of trunk: Secondary | ICD-10-CM | POA: Diagnosis not present

## 2016-02-09 DIAGNOSIS — D2272 Melanocytic nevi of left lower limb, including hip: Secondary | ICD-10-CM | POA: Diagnosis not present

## 2016-02-09 DIAGNOSIS — D2261 Melanocytic nevi of right upper limb, including shoulder: Secondary | ICD-10-CM | POA: Diagnosis not present

## 2016-02-09 DIAGNOSIS — L570 Actinic keratosis: Secondary | ICD-10-CM | POA: Diagnosis not present

## 2016-02-13 DIAGNOSIS — M1612 Unilateral primary osteoarthritis, left hip: Secondary | ICD-10-CM | POA: Diagnosis not present

## 2016-02-13 DIAGNOSIS — M25552 Pain in left hip: Secondary | ICD-10-CM | POA: Diagnosis not present

## 2016-02-16 DIAGNOSIS — H2513 Age-related nuclear cataract, bilateral: Secondary | ICD-10-CM | POA: Diagnosis not present

## 2016-02-16 DIAGNOSIS — H353131 Nonexudative age-related macular degeneration, bilateral, early dry stage: Secondary | ICD-10-CM | POA: Diagnosis not present

## 2016-02-16 DIAGNOSIS — D3131 Benign neoplasm of right choroid: Secondary | ICD-10-CM | POA: Diagnosis not present

## 2016-02-17 DIAGNOSIS — I4891 Unspecified atrial fibrillation: Secondary | ICD-10-CM | POA: Diagnosis not present

## 2016-02-17 DIAGNOSIS — H35033 Hypertensive retinopathy, bilateral: Secondary | ICD-10-CM | POA: Diagnosis not present

## 2016-02-17 DIAGNOSIS — R0602 Shortness of breath: Secondary | ICD-10-CM | POA: Diagnosis not present

## 2016-02-17 DIAGNOSIS — N183 Chronic kidney disease, stage 3 (moderate): Secondary | ICD-10-CM | POA: Diagnosis not present

## 2016-02-17 DIAGNOSIS — E7800 Pure hypercholesterolemia, unspecified: Secondary | ICD-10-CM | POA: Diagnosis not present

## 2016-02-17 DIAGNOSIS — I129 Hypertensive chronic kidney disease with stage 1 through stage 4 chronic kidney disease, or unspecified chronic kidney disease: Secondary | ICD-10-CM | POA: Diagnosis not present

## 2016-02-21 DIAGNOSIS — R0602 Shortness of breath: Secondary | ICD-10-CM | POA: Diagnosis not present

## 2016-02-21 DIAGNOSIS — I4891 Unspecified atrial fibrillation: Secondary | ICD-10-CM | POA: Diagnosis not present

## 2016-02-22 ENCOUNTER — Encounter
Admission: RE | Admit: 2016-02-22 | Discharge: 2016-02-22 | Disposition: A | Discharge status: Home / Self Care | Payer: PPO | Source: Ambulatory Visit | Attending: Orthopedic Surgery | Admitting: Orthopedic Surgery

## 2016-02-22 DIAGNOSIS — M1612 Unilateral primary osteoarthritis, left hip: Secondary | ICD-10-CM | POA: Insufficient documentation

## 2016-02-22 HISTORY — DX: Pure hypercholesterolemia, unspecified: E78.00

## 2016-02-22 HISTORY — DX: Essential (primary) hypertension: I10

## 2016-02-22 HISTORY — DX: Other complications of anesthesia, initial encounter: T88.59XA

## 2016-02-22 HISTORY — DX: Adverse effect of unspecified anesthetic, initial encounter: T41.45XA

## 2016-02-22 HISTORY — DX: Monoclonal gammopathy: D47.2

## 2016-02-22 LAB — URINALYSIS, COMPLETE (UACMP) WITH MICROSCOPIC
Bacteria, UA: NONE SEEN
Bilirubin Urine: NEGATIVE
GLUCOSE, UA: NEGATIVE mg/dL
KETONES UR: NEGATIVE mg/dL
Leukocytes, UA: NEGATIVE
Nitrite: NEGATIVE
PH: 5 (ref 5.0–8.0)
Protein, ur: >=300 mg/dL — AB
Specific Gravity, Urine: 1.013 (ref 1.005–1.030)
Squamous Epithelial / LPF: NONE SEEN

## 2016-02-22 LAB — APTT: aPTT: 29 seconds (ref 24–36)

## 2016-02-22 LAB — BASIC METABOLIC PANEL
Anion gap: 4 — ABNORMAL LOW (ref 5–15)
BUN: 34 mg/dL — AB (ref 6–20)
CO2: 27 mmol/L (ref 22–32)
Calcium: 8.6 mg/dL — ABNORMAL LOW (ref 8.9–10.3)
Chloride: 108 mmol/L (ref 101–111)
Creatinine, Ser: 1.61 mg/dL — ABNORMAL HIGH (ref 0.61–1.24)
GFR calc Af Amer: 46 mL/min — ABNORMAL LOW (ref >60)
GFR, EST NON AFRICAN AMERICAN: 40 mL/min — AB (ref >60)
GLUCOSE: 94 mg/dL (ref 65–99)
POTASSIUM: 4.9 mmol/L (ref 3.5–5.1)
Sodium: 139 mmol/L (ref 135–145)

## 2016-02-22 LAB — CBC
HCT: 36.2 % — ABNORMAL LOW (ref 40.0–52.0)
Hemoglobin: 12.5 g/dL — ABNORMAL LOW (ref 13.0–18.0)
MCH: 33.1 pg (ref 26.0–34.0)
MCHC: 34.5 g/dL (ref 32.0–36.0)
MCV: 95.8 fL (ref 80.0–100.0)
PLATELETS: 227 10*3/uL (ref 150–440)
RBC: 3.78 MIL/uL — ABNORMAL LOW (ref 4.40–5.90)
RDW: 13.5 % (ref 11.5–14.5)
WBC: 4.3 10*3/uL (ref 3.8–10.6)

## 2016-02-22 LAB — PROTIME-INR
INR: 1.11
Prothrombin Time: 14.3 seconds (ref 11.4–15.2)

## 2016-02-22 LAB — SURGICAL PCR SCREEN
MRSA, PCR: NEGATIVE
STAPHYLOCOCCUS AUREUS: NEGATIVE

## 2016-02-22 LAB — TYPE AND SCREEN
ABO/RH(D): O POS
Antibody Screen: NEGATIVE

## 2016-02-22 LAB — SEDIMENTATION RATE: Sed Rate: 25 mm/hr — ABNORMAL HIGH (ref 0–20)

## 2016-02-22 NOTE — Patient Instructions (Signed)
Your procedure is scheduled on: Tuesday 03/06/16 Report to [place]. 2ND FLOOR MEDICAL MALL ENTRANCE. To find out your arrival time please call [phone] between 1PM - 3PM on Monday 03/05/16.  Remember: Instructions that are not followed completely may result in serious medical risk, up to and including death, or upon the discretion of your surgeon and anesthesiologist your surgery may need to be rescheduled.    __X__ 1. Do not eat food or drink liquids after midnight. No gum chewing or hard candies.     __X__ 2. No Alcohol for 24 hours before or after surgery.   ____ 3. Bring all medications with you on the day of surgery if instructed.    __X__ 4. Notify your doctor if there is any change in your medical condition     (cold, fever, infections).             ___X__5. No smoking within 24 hours of your surgery.     Do not wear jewelry, make-up, hairpins, clips or nail polish.  Do not wear lotions, powders, or perfumes.   Do not shave 48 hours prior to surgery. Men may shave face and neck.  Do not bring valuables to the hospital.    [facility] is not responsible for any belongings or valuables.               Contacts, dentures or bridgework may not be worn into surgery.  Leave your suitcase in the car. After surgery it may be brought to your room.  For patients admitted to the hospital, discharge time is determined by your                treatment team.   Patients discharged the day of surgery will not be allowed to drive home.   Please read over the following fact sheets that you were given:   Pain Booklet and MRSA Information   __X__ Take these medicines the morning of surgery with A SIP OF WATER:    1. CARVEDILOL  2. TAMSULOSIN  3.   4.  5.  6.  ____ Fleet Enema (as directed)   __X__ Use CHG Soap as directed  ____ Use inhalers on the day of surgery  ____ Stop metformin 2 days prior to surgery    ____ Take 1/2 of usual insulin dose the night before surgery and  none on the morning of surgery.   __X__ Stop Coumadin/Plavix/aspirin on 02/25/16  __X__ Stop Anti-inflammatories such as Advil, Aleve, Ibuprofen, Motrin, Naproxen, Naprosyn, Goodies,powder, or aspirin products.  OK to take Tylenol.   ____ Stop supplements until after surgery.    ____ Bring C-Pap to the hospital.   [facility] LIVING WILL AND HEALTH CARE POWER OF ATTONEY

## 2016-02-23 LAB — URINE CULTURE: Culture: <10000 — AB

## 2016-02-24 NOTE — Pre-Procedure Instructions (Signed)
UA sent to Dr. Menz for review. 

## 2016-03-05 MED ORDER — CEFAZOLIN SODIUM-DEXTROSE 2-4 GM/100ML-% IV SOLN
2.0000 g | Freq: Once | INTRAVENOUS | Status: AC
  Administered 2016-03-06: 2 g via INTRAVENOUS

## 2016-03-06 ENCOUNTER — Inpatient Hospital Stay: Payer: PPO

## 2016-03-06 ENCOUNTER — Ambulatory Visit: Payer: PPO | Admitting: Oncology

## 2016-03-06 ENCOUNTER — Inpatient Hospital Stay: Payer: PPO | Admitting: Certified Registered"

## 2016-03-06 ENCOUNTER — Encounter: Payer: Self-pay | Admitting: *Deleted

## 2016-03-06 ENCOUNTER — Encounter
Admission: RE | Disposition: A | Discharge status: Home Health | Payer: Self-pay | Source: Ambulatory Visit | Attending: Orthopedic Surgery

## 2016-03-06 ENCOUNTER — Inpatient Hospital Stay
Admission: RE | Admit: 2016-03-06 | Discharge: 2016-03-08 | DRG: 470 | Disposition: A | Discharge status: Home Health | Payer: PPO | Source: Ambulatory Visit | Attending: Orthopedic Surgery | Admitting: Orthopedic Surgery

## 2016-03-06 DIAGNOSIS — D62 Acute posthemorrhagic anemia: Secondary | ICD-10-CM | POA: Diagnosis not present

## 2016-03-06 DIAGNOSIS — D472 Monoclonal gammopathy: Secondary | ICD-10-CM | POA: Diagnosis not present

## 2016-03-06 DIAGNOSIS — I482 Chronic atrial fibrillation: Secondary | ICD-10-CM | POA: Diagnosis not present

## 2016-03-06 DIAGNOSIS — N183 Chronic kidney disease, stage 3 (moderate): Secondary | ICD-10-CM | POA: Diagnosis present

## 2016-03-06 DIAGNOSIS — E1122 Type 2 diabetes mellitus with diabetic chronic kidney disease: Secondary | ICD-10-CM | POA: Diagnosis not present

## 2016-03-06 DIAGNOSIS — N400 Enlarged prostate without lower urinary tract symptoms: Secondary | ICD-10-CM | POA: Diagnosis present

## 2016-03-06 DIAGNOSIS — Z85820 Personal history of malignant melanoma of skin: Secondary | ICD-10-CM

## 2016-03-06 DIAGNOSIS — M25552 Pain in left hip: Secondary | ICD-10-CM | POA: Diagnosis not present

## 2016-03-06 DIAGNOSIS — M1612 Unilateral primary osteoarthritis, left hip: Principal | ICD-10-CM | POA: Diagnosis present

## 2016-03-06 DIAGNOSIS — G8918 Other acute postprocedural pain: Secondary | ICD-10-CM

## 2016-03-06 DIAGNOSIS — Z79899 Other long term (current) drug therapy: Secondary | ICD-10-CM

## 2016-03-06 DIAGNOSIS — E7800 Pure hypercholesterolemia, unspecified: Secondary | ICD-10-CM | POA: Diagnosis not present

## 2016-03-06 DIAGNOSIS — Z96642 Presence of left artificial hip joint: Secondary | ICD-10-CM | POA: Diagnosis not present

## 2016-03-06 DIAGNOSIS — N189 Chronic kidney disease, unspecified: Secondary | ICD-10-CM | POA: Diagnosis not present

## 2016-03-06 DIAGNOSIS — Z7982 Long term (current) use of aspirin: Secondary | ICD-10-CM

## 2016-03-06 DIAGNOSIS — Z8673 Personal history of transient ischemic attack (TIA), and cerebral infarction without residual deficits: Secondary | ICD-10-CM

## 2016-03-06 DIAGNOSIS — I129 Hypertensive chronic kidney disease with stage 1 through stage 4 chronic kidney disease, or unspecified chronic kidney disease: Secondary | ICD-10-CM | POA: Diagnosis not present

## 2016-03-06 DIAGNOSIS — M109 Gout, unspecified: Secondary | ICD-10-CM | POA: Diagnosis present

## 2016-03-06 DIAGNOSIS — Z87891 Personal history of nicotine dependence: Secondary | ICD-10-CM

## 2016-03-06 DIAGNOSIS — Z419 Encounter for procedure for purposes other than remedying health state, unspecified: Secondary | ICD-10-CM

## 2016-03-06 HISTORY — PX: TOTAL HIP ARTHROPLASTY: SHX124

## 2016-03-06 LAB — CBC
HCT: 28.9 % — ABNORMAL LOW (ref 40.0–52.0)
Hemoglobin: 10 g/dL — ABNORMAL LOW (ref 13.0–18.0)
MCH: 33.6 pg (ref 26.0–34.0)
MCHC: 34.8 g/dL (ref 32.0–36.0)
MCV: 96.6 fL (ref 80.0–100.0)
PLATELETS: 168 10*3/uL (ref 150–440)
RBC: 2.99 MIL/uL — AB (ref 4.40–5.90)
RDW: 13.3 % (ref 11.5–14.5)
WBC: 7 10*3/uL (ref 3.8–10.6)

## 2016-03-06 LAB — CREATININE, SERUM
Creatinine, Ser: 1.71 mg/dL — ABNORMAL HIGH (ref 0.61–1.24)
GFR calc Af Amer: 43 mL/min — ABNORMAL LOW (ref >60)
GFR, EST NON AFRICAN AMERICAN: 37 mL/min — AB (ref >60)

## 2016-03-06 LAB — ABO/RH: ABO/RH(D): O POS

## 2016-03-06 SURGERY — ARTHROPLASTY, HIP, TOTAL, ANTERIOR APPROACH
Anesthesia: Spinal | Site: Hip | Laterality: Left | Wound class: Clean

## 2016-03-06 MED ORDER — MENTHOL 3 MG MT LOZG
1.0000 | LOZENGE | OROMUCOSAL | Status: DC | PRN
  Filled 2016-03-06: qty 9

## 2016-03-06 MED ORDER — TAMSULOSIN HCL 0.4 MG PO CAPS
0.4000 mg | ORAL_CAPSULE | Freq: Every day | ORAL | Status: DC
  Administered 2016-03-07 – 2016-03-08 (×2): 0.4 mg via ORAL
  Filled 2016-03-06 (×2): qty 1

## 2016-03-06 MED ORDER — ENOXAPARIN SODIUM 40 MG/0.4ML ~~LOC~~ SOLN
40.0000 mg | SUBCUTANEOUS | Status: DC
  Administered 2016-03-07 – 2016-03-08 (×2): 40 mg via SUBCUTANEOUS
  Filled 2016-03-06 (×2): qty 0.4

## 2016-03-06 MED ORDER — METHOCARBAMOL 500 MG PO TABS: 500.0000 mg | ORAL_TABLET | Freq: Four times a day (QID) | ORAL | Status: DC | PRN

## 2016-03-06 MED ORDER — FENTANYL CITRATE (PF) 100 MCG/2ML IJ SOLN
INTRAMUSCULAR | Status: DC | PRN
  Administered 2016-03-06: 50 ug via INTRAVENOUS
  Administered 2016-03-06 (×2): 25 ug via INTRAVENOUS

## 2016-03-06 MED ORDER — BUPIVACAINE HCL (PF) 0.5 % IJ SOLN
INTRAMUSCULAR | Status: AC
  Filled 2016-03-06: qty 10

## 2016-03-06 MED ORDER — LIDOCAINE HCL (PF) 2 % IJ SOLN
INTRAMUSCULAR | Status: AC
  Filled 2016-03-06: qty 2

## 2016-03-06 MED ORDER — BISACODYL 10 MG RE SUPP: 10.0000 mg | Freq: Every day | RECTAL | Status: DC | PRN

## 2016-03-06 MED ORDER — LIDOCAINE HCL (PF) 2 % IJ SOLN
INTRAMUSCULAR | Status: DC | PRN
  Administered 2016-03-06: 50 mg

## 2016-03-06 MED ORDER — DEXTROSE 5 % IV SOLN
500.0000 mg | Freq: Four times a day (QID) | INTRAVENOUS | Status: DC | PRN
  Filled 2016-03-06: qty 5

## 2016-03-06 MED ORDER — METOCLOPRAMIDE HCL 5 MG/ML IJ SOLN: 5.0000 mg | Freq: Three times a day (TID) | INTRAMUSCULAR | Status: DC | PRN

## 2016-03-06 MED ORDER — GLYCOPYRROLATE 0.2 MG/ML IJ SOLN
INTRAMUSCULAR | Status: DC | PRN
  Administered 2016-03-06: 0.2 mg via INTRAVENOUS

## 2016-03-06 MED ORDER — METOCLOPRAMIDE HCL 10 MG PO TABS: 5.0000 mg | ORAL_TABLET | Freq: Three times a day (TID) | ORAL | Status: DC | PRN

## 2016-03-06 MED ORDER — ONDANSETRON HCL 4 MG/2ML IJ SOLN
4.0000 mg | Freq: Once | INTRAMUSCULAR | Status: DC | PRN
Start: 2016-03-06 — End: 2016-03-06

## 2016-03-06 MED ORDER — FAMOTIDINE 20 MG PO TABS
ORAL_TABLET | ORAL | Status: AC
  Administered 2016-03-06: 20 mg via ORAL
  Filled 2016-03-06: qty 1

## 2016-03-06 MED ORDER — CLONIDINE HCL 0.1 MG PO TABS
0.2000 mg | ORAL_TABLET | Freq: Once | ORAL | Status: AC
  Administered 2016-03-06: 0.2 mg via ORAL
  Filled 2016-03-06: qty 2

## 2016-03-06 MED ORDER — PROPOFOL 10 MG/ML IV BOLUS
INTRAVENOUS | Status: DC | PRN
  Administered 2016-03-06: 20 mg via INTRAVENOUS

## 2016-03-06 MED ORDER — EPHEDRINE SULFATE 50 MG/ML IJ SOLN
INTRAMUSCULAR | Status: DC | PRN
  Administered 2016-03-06: 10 mg via INTRAVENOUS
  Administered 2016-03-06: 25 mg via INTRAVENOUS
  Administered 2016-03-06: 10 mg via INTRAVENOUS

## 2016-03-06 MED ORDER — LACTATED RINGERS IV SOLN
INTRAVENOUS | Status: DC
  Administered 2016-03-06 (×2): via INTRAVENOUS

## 2016-03-06 MED ORDER — GLYCOPYRROLATE 0.2 MG/ML IJ SOLN
INTRAMUSCULAR | Status: AC
  Filled 2016-03-06: qty 1

## 2016-03-06 MED ORDER — DOCUSATE SODIUM 100 MG PO CAPS
100.0000 mg | ORAL_CAPSULE | Freq: Two times a day (BID) | ORAL | Status: DC
  Administered 2016-03-06 – 2016-03-08 (×4): 100 mg via ORAL
  Filled 2016-03-06 (×4): qty 1

## 2016-03-06 MED ORDER — BUPIVACAINE-EPINEPHRINE 0.25% -1:200000 IJ SOLN
INTRAMUSCULAR | Status: DC | PRN
  Administered 2016-03-06: 30 mL

## 2016-03-06 MED ORDER — PHENYLEPHRINE HCL 10 MG/ML IJ SOLN
INTRAMUSCULAR | Status: AC
  Filled 2016-03-06: qty 1

## 2016-03-06 MED ORDER — PHENYLEPHRINE HCL 10 MG/ML IJ SOLN
INTRAMUSCULAR | Status: DC | PRN
  Administered 2016-03-06: 200 ug via INTRAVENOUS
  Administered 2016-03-06 (×2): 100 ug via INTRAVENOUS
  Administered 2016-03-06: 200 ug via INTRAVENOUS

## 2016-03-06 MED ORDER — NEOMYCIN-POLYMYXIN B GU 40-200000 IR SOLN
Status: AC
  Filled 2016-03-06: qty 4

## 2016-03-06 MED ORDER — PROPOFOL 500 MG/50ML IV EMUL
INTRAVENOUS | Status: DC | PRN
  Administered 2016-03-06: 35 ug/kg/min via INTRAVENOUS

## 2016-03-06 MED ORDER — FENTANYL CITRATE (PF) 100 MCG/2ML IJ SOLN
INTRAMUSCULAR | Status: AC
  Filled 2016-03-06: qty 2

## 2016-03-06 MED ORDER — SODIUM CHLORIDE 0.9 % IV SOLN
INTRAVENOUS | Status: DC
  Administered 2016-03-06 – 2016-03-07 (×3): via INTRAVENOUS

## 2016-03-06 MED ORDER — DIPHENHYDRAMINE HCL 12.5 MG/5ML PO ELIX: 12.5000 mg | ORAL_SOLUTION | ORAL | Status: DC | PRN

## 2016-03-06 MED ORDER — CEFAZOLIN SODIUM-DEXTROSE 2-4 GM/100ML-% IV SOLN
INTRAVENOUS | Status: AC
  Filled 2016-03-06: qty 100

## 2016-03-06 MED ORDER — NEOMYCIN-POLYMYXIN B GU 40-200000 IR SOLN
Status: DC | PRN
  Administered 2016-03-06: 4 mL

## 2016-03-06 MED ORDER — PROPOFOL 500 MG/50ML IV EMUL
INTRAVENOUS | Status: AC
  Filled 2016-03-06: qty 50

## 2016-03-06 MED ORDER — TRANEXAMIC ACID 1000 MG/10ML IV SOLN
INTRAVENOUS | Status: DC | PRN
  Administered 2016-03-06: 1000 mg via INTRAVENOUS

## 2016-03-06 MED ORDER — ACETAMINOPHEN 650 MG RE SUPP: 650.0000 mg | Freq: Four times a day (QID) | RECTAL | Status: DC | PRN

## 2016-03-06 MED ORDER — PHENOL 1.4 % MT LIQD
1.0000 | OROMUCOSAL | Status: DC | PRN
  Filled 2016-03-06: qty 177

## 2016-03-06 MED ORDER — VITAMIN D 1000 UNITS PO TABS
5000.0000 [IU] | ORAL_TABLET | Freq: Every day | ORAL | Status: DC
  Administered 2016-03-07 – 2016-03-08 (×2): 5000 [IU] via ORAL
  Filled 2016-03-06 (×2): qty 5

## 2016-03-06 MED ORDER — MIDAZOLAM HCL 2 MG/2ML IJ SOLN
INTRAMUSCULAR | Status: AC
  Filled 2016-03-06: qty 2

## 2016-03-06 MED ORDER — OXYCODONE HCL 5 MG PO TABS
5.0000 mg | ORAL_TABLET | ORAL | Status: DC | PRN
  Administered 2016-03-06 – 2016-03-08 (×5): 5 mg via ORAL
  Filled 2016-03-06 (×6): qty 1

## 2016-03-06 MED ORDER — BUPIVACAINE HCL (PF) 0.5 % IJ SOLN
INTRAMUSCULAR | Status: DC | PRN
  Administered 2016-03-06: 3 mL via INTRATHECAL

## 2016-03-06 MED ORDER — CEFAZOLIN SODIUM-DEXTROSE 2-3 GM-% IV SOLR
2.0000 g | Freq: Four times a day (QID) | INTRAVENOUS | Status: AC
  Administered 2016-03-06 – 2016-03-07 (×3): 2 g via INTRAVENOUS
  Filled 2016-03-06 (×4): qty 50

## 2016-03-06 MED ORDER — ONDANSETRON HCL 4 MG/2ML IJ SOLN
4.0000 mg | Freq: Four times a day (QID) | INTRAMUSCULAR | Status: DC | PRN
  Administered 2016-03-06: 4 mg via INTRAVENOUS
  Filled 2016-03-06: qty 2

## 2016-03-06 MED ORDER — ONDANSETRON HCL 4 MG PO TABS: 4.0000 mg | ORAL_TABLET | Freq: Four times a day (QID) | ORAL | Status: DC | PRN

## 2016-03-06 MED ORDER — CEFAZOLIN SODIUM-DEXTROSE 2-4 GM/100ML-% IV SOLN
2.0000 g | Freq: Four times a day (QID) | INTRAVENOUS | Status: DC
  Filled 2016-03-06 (×3): qty 100

## 2016-03-06 MED ORDER — BUPIVACAINE-EPINEPHRINE (PF) 0.25% -1:200000 IJ SOLN
INTRAMUSCULAR | Status: AC
  Filled 2016-03-06: qty 30

## 2016-03-06 MED ORDER — SODIUM CHLORIDE 0.9 % IJ SOLN
INTRAMUSCULAR | Status: AC
  Filled 2016-03-06: qty 10

## 2016-03-06 MED ORDER — ACETAMINOPHEN 325 MG PO TABS: 650.0000 mg | ORAL_TABLET | Freq: Four times a day (QID) | ORAL | Status: DC | PRN

## 2016-03-06 MED ORDER — MAGNESIUM HYDROXIDE 400 MG/5ML PO SUSP
30.0000 mL | Freq: Every day | ORAL | Status: DC | PRN
  Administered 2016-03-07 – 2016-03-08 (×2): 30 mL via ORAL
  Filled 2016-03-06 (×2): qty 30

## 2016-03-06 MED ORDER — MAGNESIUM CITRATE PO SOLN
1.0000 | Freq: Once | ORAL | Status: DC | PRN
  Filled 2016-03-06: qty 296

## 2016-03-06 MED ORDER — ASPIRIN EC 81 MG PO TBEC
81.0000 mg | DELAYED_RELEASE_TABLET | Freq: Every day | ORAL | Status: DC
  Administered 2016-03-06 – 2016-03-07 (×2): 81 mg via ORAL
  Filled 2016-03-06 (×3): qty 1

## 2016-03-06 MED ORDER — TRANEXAMIC ACID 1000 MG/10ML IV SOLN
INTRAVENOUS | Status: AC
  Filled 2016-03-06: qty 10

## 2016-03-06 MED ORDER — FAMOTIDINE 20 MG PO TABS
20.0000 mg | ORAL_TABLET | Freq: Once | ORAL | Status: AC
  Administered 2016-03-06: 20 mg via ORAL

## 2016-03-06 MED ORDER — CARVEDILOL 12.5 MG PO TABS
12.5000 mg | ORAL_TABLET | Freq: Two times a day (BID) | ORAL | Status: DC
  Administered 2016-03-06 – 2016-03-08 (×4): 12.5 mg via ORAL
  Filled 2016-03-06 (×4): qty 1

## 2016-03-06 MED ORDER — ZOLPIDEM TARTRATE 5 MG PO TABS: 5.0000 mg | ORAL_TABLET | Freq: Every evening | ORAL | Status: DC | PRN

## 2016-03-06 MED ORDER — MORPHINE SULFATE (PF) 2 MG/ML IV SOLN: 2.0000 mg | INTRAVENOUS | Status: DC | PRN

## 2016-03-06 MED ORDER — EPHEDRINE SULFATE 50 MG/ML IJ SOLN
INTRAMUSCULAR | Status: AC
  Filled 2016-03-06: qty 1

## 2016-03-06 MED ORDER — FENTANYL CITRATE (PF) 100 MCG/2ML IJ SOLN: 25.0000 ug | INTRAMUSCULAR | Status: DC | PRN

## 2016-03-06 MED ORDER — MIDAZOLAM HCL 5 MG/5ML IJ SOLN
INTRAMUSCULAR | Status: DC | PRN
  Administered 2016-03-06: 2 mg via INTRAVENOUS

## 2016-03-06 SURGICAL SUPPLY — 46 items
BLADE SAW SAG 18.5X105 (BLADE) ×3 IMPLANT
BNDG COHESIVE 6X5 TAN STRL LF (GAUZE/BANDAGES/DRESSINGS) ×6 IMPLANT
CANISTER SUCT 1200ML W/VALVE (MISCELLANEOUS) ×3 IMPLANT
CAPT HIP TOTAL 3 ×3 IMPLANT
CATH FOL LEG HOLDER (MISCELLANEOUS) ×3 IMPLANT
CATH TRAY METER 16FR LF (MISCELLANEOUS) ×3 IMPLANT
CHLORAPREP W/TINT 26ML (MISCELLANEOUS) ×3 IMPLANT
DRAPE C-ARM XRAY 36X54 (DRAPES) ×3 IMPLANT
DRAPE INCISE IOBAN 66X60 STRL (DRAPES) IMPLANT
DRAPE POUCH INSTRU U-SHP 10X18 (DRAPES) ×3 IMPLANT
DRAPE SHEET LG 3/4 BI-LAMINATE (DRAPES) ×9 IMPLANT
DRAPE TABLE BACK 80X90 (DRAPES) ×3 IMPLANT
DRSG OPSITE POSTOP 4X8 (GAUZE/BANDAGES/DRESSINGS) ×6 IMPLANT
ELECT BLADE 6.5 EXT (BLADE) ×3 IMPLANT
ELECT REM PT RETURN 9FT ADLT (ELECTROSURGICAL) ×3
ELECTRODE REM PT RTRN 9FT ADLT (ELECTROSURGICAL) ×1 IMPLANT
GLOVE BIO SURGEON STRL SZ7 (GLOVE) ×18 IMPLANT
GLOVE BIOGEL PI IND STRL 9 (GLOVE) ×1 IMPLANT
GLOVE BIOGEL PI INDICATOR 9 (GLOVE) ×2
GLOVE SURG SYN 9.0  PF PI (GLOVE) ×4
GLOVE SURG SYN 9.0 PF PI (GLOVE) ×2 IMPLANT
GOWN SRG 2XL LVL 4 RGLN SLV (GOWNS) ×1 IMPLANT
GOWN STRL NON-REIN 2XL LVL4 (GOWNS) ×2
GOWN STRL REUS W/ TWL LRG LVL3 (GOWN DISPOSABLE) ×3 IMPLANT
GOWN STRL REUS W/TWL LRG LVL3 (GOWN DISPOSABLE) ×6
HEMOVAC 400CC 10FR (MISCELLANEOUS) ×3 IMPLANT
HOOD PEEL AWAY FLYTE STAYCOOL (MISCELLANEOUS) ×3 IMPLANT
MAT BLUE FLOOR 46X72 FLO (MISCELLANEOUS) ×3 IMPLANT
NDL SAFETY 18GX1.5 (NEEDLE) ×3 IMPLANT
NEEDLE SPNL 18GX3.5 QUINCKE PK (NEEDLE) ×3 IMPLANT
NS IRRIG 1000ML POUR BTL (IV SOLUTION) ×3 IMPLANT
PACK HIP COMPR (MISCELLANEOUS) ×3 IMPLANT
SOL PREP PVP 2OZ (MISCELLANEOUS) ×3
SOLUTION PREP PVP 2OZ (MISCELLANEOUS) ×1 IMPLANT
SPONGE DRAIN TRACH 4X4 STRL 2S (GAUZE/BANDAGES/DRESSINGS) ×3 IMPLANT
STAPLER SKIN PROX 35W (STAPLE) ×3 IMPLANT
STRAP SAFETY BODY (MISCELLANEOUS) ×3 IMPLANT
SUT DVC 2 QUILL PDO  T11 36X36 (SUTURE) ×2
SUT DVC 2 QUILL PDO T11 36X36 (SUTURE) ×1 IMPLANT
SUT SILK 0 (SUTURE) ×2
SUT SILK 0 30XBRD TIE 6 (SUTURE) ×1 IMPLANT
SUT V-LOC 90 ABS DVC 3-0 CL (SUTURE) ×3 IMPLANT
SUT VIC AB 1 CT1 36 (SUTURE) ×3 IMPLANT
SYR 20CC LL (SYRINGE) ×3 IMPLANT
SYR 30ML LL (SYRINGE) ×3 IMPLANT
TOWEL OR 17X26 4PK STRL BLUE (TOWEL DISPOSABLE) ×3 IMPLANT

## 2016-03-06 NOTE — H&P (Signed)
Reviewed paper H+P, will be scanned into chart. Patient examined No changes noted.  

## 2016-03-06 NOTE — Op Note (Signed)
03/06/2016  9:35 AM  PATIENT:  Lawrence Perkins.  76 y.o. male  PRE-OPERATIVE DIAGNOSIS:  PRIMARY OSTEOARTHRITIS OF LEFT HIP  POST-OPERATIVE DIAGNOSIS:  PRIMARY OSTEOARTHRITIS OF LEFT HIP  PROCEDURE:  Procedure(s): TOTAL HIP ARTHROPLASTY ANTERIOR APPROACH (Left)  SURGEON: Laurene Footman, MD  ASSISTANTS: none  ANESTHESIA:   spinal  EBL:  Total I/O In: 1500 [I.V.:1500] Out: 1100 [Urine:100; Blood:1000]  BLOOD ADMINISTERED:none  DRAINS: (2) Hemovact drain(s) in the subcutaneous layer with  Suction Open   LOCAL MEDICATIONS USED:  MARCAINE    and OTHER TXA  SPECIMEN:  Source of Specimen:  Left femoral head  DISPOSITION OF SPECIMEN:  PATHOLOGY  COUNTS:  YES  TOURNIQUET:  * No tourniquets in log *  IMPLANTS: Medacta AMIS 8 standard stem S 28 mm head with 81mm Mpact DM cup and liner  DICTATION: .Dragon Dictation  The patient was brought to the operating room and after spinal anesthesia was obtained patient was placed on the operative table with the ipsilateral foot into the Medacta attachment, contralateral leg on a well-padded table. C-arm was brought in and preop template x-ray taken. After prepping and draping in usual sterile fashion appropriate patient identification and timeout procedures were completed. Anterior approach to the hip was obtained and centered over the greater trochanter and TFL muscle. The subcutaneous tissue was incised hemostasis being achieved by electrocautery. TFL fascia was incised and the muscle retracted laterally deep retractor placed. The lateral femoral circumflex vessels were identified and ligated. The anterior capsule was exposed and a capsulotomy performed. The neck was identified and a femoral neck cut carried out with a saw. The head was removed without difficulty and showed sclerotic femoral head and acetabulum. Reaming was carried out to 58 mm and a 60 mm cup trial gave appropriate tightness to the acetabular component a 60 DM cup was impacted  into position. The leg was then externally rotated and ischiofemoral and pubofemoral releases carried out. The femur was sequentially broached to a size 8, size 8 standard width S head trials were placed and the final components chosen. The 8 standard stem was inserted along with a S 28 mm head and 60 mm liner. The hip was reduced and was stable the wound was thoroughly irrigated. The deep fascia was closed using a heavy Quill after infiltration of 30 cc of quarter percent Sensorcaine with epinephrine, followed by TXA into the joint. Subcutaneous drains were then inserted. 3-0 v-loc to close the skin with skin staples Xeroform and honeycomb dressing applied.  PLAN OF CARE: Admit to inpatient

## 2016-03-06 NOTE — NC FL2 (Signed)
[place] LEVEL OF CARE SCREENING TOOL     IDENTIFICATION  Patient Name: Lawrence Perkins. Birthdate: 04-14-1940 Sex: male Admission Date (Current Location): 03/06/2016  [place] and [place] Number:  Engineering geologist and Address:  [facility], [address], [place], [place] [address]      Provider Number: [id number]  Attending Physician Name and Address:  Hessie Knows, MD  Relative Name and Phone Number:       Current Level of Care: Hospital Recommended Level of Care: [place] Prior Approval Number:    Date Approved/Denied:   PASRR Number:  [id number])  Discharge Plan: SNF    Current Diagnoses: Patient Active Problem List   Diagnosis Date Noted  . Primary osteoarthritis of left hip 03/06/2016  . MGUS (monoclonal gammopathy of unknown significance) 10/31/2015  . ATRIAL FIBRILLATION 02/14/2010  . 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 )                       Personal Care Assistance Level of Assistance  Bathing, Feeding, Dressing Bathing Assistance: Limited assistance Feeding assistance: Independent Dressing Assistance: Limited assistance     Functional Limitations Info  Sight, Speech, Hearing Sight Info: Adequate Hearing Info: Adequate Speech Info: Adequate    SPECIAL CARE FACTORS FREQUENCY  PT (By licensed PT), OT (By licensed OT)     PT Frequency:  (5) OT Frequency:  (5)            Contractures      Additional Factors Info  Code Status, Allergies Code Status Info:  (Full Code. ) Allergies Info:   (No Known Allergies. )           Current Medications (03/06/2016):  This is the current hospital active medication list Current Facility-Administered Medications  Medication Dose Route Frequency Provider Last Rate Last Dose  . 0.9 %  sodium chloride infusion   Intravenous Continuous Hessie Knows, MD 100 mL/hr at 03/06/16 1251    . acetaminophen (TYLENOL) tablet 650 mg  650 mg Oral Q6H PRN Hessie Knows, MD       Or  . acetaminophen (TYLENOL) suppository 650 mg  650 mg Rectal Q6H PRN Hessie Knows, MD      . aspirin EC tablet 81 mg  81 mg Oral QHS Hessie Knows, MD      . bisacodyl (DULCOLAX) suppository 10 mg  10 mg Rectal Daily PRN Hessie Knows, MD      . carvedilol (COREG) tablet 12.5 mg  12.5 mg Oral BID Hessie Knows, MD      . ceFAZolin (ANCEF) IVPB 2 g/50 mL premix  2 g Intravenous Q6H Hessie Knows, MD      . cholecalciferol (VITAMIN D) tablet 5,000 Units  5,000 Units Oral Daily Hessie Knows, MD      . diphenhydrAMINE (BENADRYL) 12.5 MG/5ML elixir 12.5-25 mg  12.5-25 mg Oral Q4H PRN Hessie Knows, MD      . docusate sodium (COLACE) capsule 100 mg  100 mg Oral BID Hessie Knows, MD      . Derrill Memo ON 03/07/2016] enoxaparin (  LOVENOX) injection 40 mg  40 mg Subcutaneous Q24H Hessie Knows, MD      . magnesium citrate solution 1 Bottle  1 Bottle Oral Once PRN Hessie Knows, MD      . magnesium hydroxide (MILK OF MAGNESIA) suspension 30 mL  30 mL Oral Daily PRN Hessie Knows, MD      . menthol-cetylpyridinium (CEPACOL) lozenge 3 mg  1 lozenge Oral PRN Hessie Knows, MD       Or  . phenol (CHLORASEPTIC) mouth spray 1 spray  1 spray Mouth/Throat PRN Hessie Knows, MD      . methocarbamol (ROBAXIN) tablet 500 mg  500 mg Oral Q6H PRN Hessie Knows, MD       Or  . methocarbamol (ROBAXIN) 500 mg in dextrose 5 % 50 mL IVPB  500 mg Intravenous Q6H PRN Hessie Knows, MD      . metoCLOPramide (REGLAN) tablet 5-10 mg  5-10 mg Oral Q8H PRN Hessie Knows, MD       Or  . metoCLOPramide (REGLAN) injection 5-10 mg  5-10  mg Intravenous Q8H PRN Hessie Knows, MD      . morphine 2 MG/ML injection 2 mg  2 mg Intravenous Q1H PRN Hessie Knows, MD      . ondansetron [facility]) tablet 4 mg  4 mg Oral Q6H PRN Hessie Knows, MD       Or  . ondansetron [facility]) injection 4 mg  4 mg Intravenous Q6H PRN Hessie Knows, MD   4 mg at 03/06/16 1249  . oxyCODONE (Oxy IR/ROXICODONE) immediate release tablet 5-10 mg  5-10 mg Oral Q3H PRN Hessie Knows, MD      . Derrill Memo ON 03/07/2016] tamsulosin (FLOMAX) capsule 0.4 mg  0.4 mg Oral Daily Hessie Knows, MD      . zolpidem [facility]) tablet 5 mg  5 mg Oral QHS PRN Hessie Knows, MD         Discharge Medications: Please see discharge summary for a list of discharge medications.  Relevant Imaging Results:  Relevant Lab Results:   Additional Information  (SSN: [id number])  Savva Beamer, Veronia Beets, LCSW

## 2016-03-06 NOTE — Anesthesia Preprocedure Evaluation (Signed)
Anesthesia Evaluation  Patient identified by MRN, date of birth, ID band Patient awake    Reviewed: Allergy & Precautions, H&P , NPO status , Patient's Chart, lab work & pertinent test results, reviewed documented beta blocker date and time   History of Anesthesia Complications Negative for: history of anesthetic complications  Airway Mallampati: I  TM Distance: >3 FB Neck ROM: full    Dental  (+) Caps, Teeth Intact   Pulmonary neg shortness of breath, neg sleep apnea, neg COPD, Recent URI , former smoker,           Cardiovascular Exercise Tolerance: Good hypertension, On Home Beta Blockers and On Medications (-) angina(-) CAD, (-) Past MI, (-) Cardiac Stents and (-) CABG + dysrhythmias Atrial Fibrillation (-) Valvular Problems/Murmurs     Neuro/Psych negative neurological ROS  negative psych ROS   GI/Hepatic negative GI ROS, Neg liver ROS,   Endo/Other  negative endocrine ROS  Renal/GU CRFRenal disease  negative genitourinary   Musculoskeletal   Abdominal   Peds  Hematology negative hematology ROS (+)   Anesthesia Other Findings Past Medical History: No date: Chronic kidney disease No date: Complication of anesthesia     Comment: has converted to afib during and/or after               surgery in the past No date: Diverticulosis No date: Dysrhythmia     Comment: afib No date: Gout No date: Hypercholesteremia No date: Hypertension No date: 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* No date: Prostatism No date: Rectal bleeding   Reproductive/Obstetrics negative OB ROS                             Anesthesia Physical Anesthesia Plan  ASA: III  Anesthesia Plan: Spinal   Post-op Pain Management:    Induction:   Airway Management Planned:   Additional Equipment:   Intra-op Plan:   Post-operative  Plan:   Informed Consent: I have reviewed the patients History and Physical, chart, labs and discussed the procedure including the risks, benefits and alternatives for the proposed anesthesia with the patient or authorized representative who has indicated his/her understanding and acceptance.   Dental Advisory Given  Plan Discussed with: Anesthesiologist, CRNA and Surgeon  Anesthesia Plan Comments:         Anesthesia Quick Evaluation

## 2016-03-06 NOTE — Care Management (Signed)
PT in working with patient 

## 2016-03-06 NOTE — Transfer of Care (Signed)
Immediate Anesthesia Transfer of Care Note  Patient: Lawrence Perkins.  Procedure(s) Performed: Procedure(s): TOTAL HIP ARTHROPLASTY ANTERIOR APPROACH (Left)  Patient Location: PACU  Anesthesia Type:Spinal  Level of Consciousness: awake, alert  and oriented  Airway & Oxygen Therapy: Patient Spontanous Breathing and Patient connected to face mask oxygen  Post-op Assessment: Report given to RN and Post -op Vital signs reviewed and stable  Post vital signs: Reviewed  Last Vitals:  Vitals:   03/06/16 0610 03/06/16 0936  BP: (!) 218/79 111/63  Pulse: (!) 53 62  Resp: 16 19  Temp: 36.2 C 36.4 C    Last Pain:  Vitals:   03/06/16 0610  TempSrc: Oral         Complications: No apparent anesthesia complications

## 2016-03-06 NOTE — Evaluation (Signed)
Physical Therapy Evaluation Patient Details Name: Lawrence Perkins. MRN: [id number] DOB: May 24, 1940 Today's Date: 03/06/2016   History of Present Illness  Pt underwent L THR anterior approach without reported post-op complications. He has a history of prior R THR. No specific questions or concerns from patient at time of PT evaluation  Clinical Impression  Pt admitted with above diagnosis. Pt currently with functional limitations due to the deficits listed below (see PT Problem List). Pt demonstrates good strength and stability with transfers and limited ambulation from bed to recliner. He is able to complete all supine exercises and requires assist for L SLR. After ambulation pt reports increase in nausea as well as mild lightheadedness. Vitals obtained and BP is elevated. RN notified. Pt will benefit from [facility] PT at discharge. He has no equipment needs. Pt will benefit from skilled PT services to address deficits in strength, balance, and mobility in order to return to full function at home.     Follow Up Recommendations Home health PT    Equipment Recommendations  None recommended by PT    Recommendations for Other Services OT consult     Precautions / Restrictions Precautions Precautions: Anterior Hip Precaution Booklet Issued: Yes (comment) Restrictions Weight Bearing Restrictions: Yes LLE Weight Bearing: Weight bearing as tolerated      Mobility  Bed Mobility Overal bed mobility: Needs Assistance Bed Mobility: Supine to Sit     Supine to sit: Min assist     General bed mobility comments: Pt requires minA for adduction of LLE when exiting bed to the R. Good UE strength noted with use of bed rails  Transfers Overall transfer level: Needs assistance Equipment used: Rolling walker (2 wheeled) Transfers: Sit to/from Stand Sit to Stand: Min guard         General transfer comment: Cues provided for safe hand placement during transfers. Fair speed/sequencing with  decreased weight shifting to LLE during transfers  Ambulation/Gait Ambulation/Gait assistance: Min guard Ambulation Distance (Feet): 5 Feet Assistive device: Rolling walker (2 wheeled) Gait Pattern/deviations: Decreased step length - right;Decreased stance time - left;Decreased weight shift to left Gait velocity: Decreased Gait velocity interpretation: <1.8 ft/sec, indicative of risk for recurrent falls General Gait Details: Pt able to ambulate from bed to recliner with rolling walker. Cues for proper sequencing with rolling walker. Decreased weight shifting to LLE with increased UE support on rolling walker. After ambulation pt reports increase in nausea. No vomiting but vitals obtained and BP is elevated. Recorded in chart and RN notified  Stairs            Wheelchair Mobility    Modified Rankin (Stroke Patients Only)       Balance Overall balance assessment: Needs assistance Sitting-balance support: No upper extremity supported Sitting balance-Leahy Scale: Good     Standing balance support: Bilateral upper extremity supported Standing balance-Leahy Scale: Fair                               Pertinent Vitals/Pain Pain Assessment: 0-10 Pain Score: 3  Pain Location: L hip Pain Descriptors / Indicators: Aching Pain Intervention(s): RN gave pain meds during session;Patient requesting pain meds-RN notified;Monitored during session    Home Living Family/patient expects to be discharged to:: Private residence Living Arrangements: Spouse/significant other Available Help at Discharge: Family Type of Home: House Home Access: Stairs to enter Entrance Stairs-Rails: Psychiatric nurse of Steps: [place]: One level Home Equipment: Environmental consultant -  2 wheels;Cane - single point;Shower seat - built in (no [facility])      Prior Function Level of Independence: Independent         Comments: Independent with ADLs/IADLs. Drives. No falls in the last 12 months      Hand Dominance   Dominant Hand: Right    Extremity/Trunk Assessment   Upper Extremity Assessment Upper Extremity Assessment: Overall WFL for tasks assessed    Lower Extremity Assessment Lower Extremity Assessment: LLE deficits/detail LLE Deficits / Details: Pt requires assist for L SLR. Full DF/PF. Reports intact sensation to light touch bilateral LEs       Communication   Communication: No difficulties  Cognition Arousal/Alertness: Awake/alert Behavior During Therapy: WFL for tasks assessed/performed Overall Cognitive Status: Within Functional Limits for tasks assessed                      General Comments      Exercises Total Joint Exercises Ankle Circles/Pumps: AROM;Both;10 reps;Supine Quad Sets: Strengthening;Both;10 reps;Supine Gluteal Sets: Strengthening;Both;10 reps;Supine Towel Squeeze: Strengthening;Both;10 reps;Supine Short Arc Quad: Strengthening;Left;10 reps;Supine Heel Slides: Strengthening;Left;10 reps;Supine Hip ABduction/ADduction: Strengthening;Left;10 reps;Supine Straight Leg Raises: Strengthening;Left;10 reps;Supine   Assessment/Plan    PT Assessment Patient needs continued PT services  PT Problem List Decreased strength;Decreased balance;Decreased mobility;Pain       PT Treatment Interventions DME instruction;Gait training;Stair training;Functional mobility training;Therapeutic activities;Therapeutic exercise;Balance training;Neuromuscular re-education;Patient/family education;Manual techniques    PT Goals (Current goals can be found in the Care Plan section)  Acute Rehab PT Goals Patient Stated Goal: Return to prior function at home PT Goal Formulation: With patient/family Time For Goal Achievement: 03/20/16 Potential to Achieve Goals: Good    Frequency BID   Barriers to discharge        Co-evaluation               End of Session Equipment Utilized During Treatment: Gait belt Activity Tolerance: Patient tolerated  treatment well Patient left: in chair;with call bell/phone within reach;with chair alarm set;with SCD's reapplied;with family/visitor present Nurse Communication: Mobility status;Other (comment) (Elevated BP) PT Visit Diagnosis: Unsteadiness on feet (R26.81);Muscle weakness (generalized) (M62.81)         Time: 6415-8309 PT Time Calculation (min) (ACUTE ONLY): 37 min   Charges:   PT Evaluation $PT Eval Low Complexity: 1 Procedure PT Treatments $Therapeutic Exercise: 8-22 mins   PT G Codes:        Lyndel Safe Huprich PT, DPT   Huprich,Jason 03/06/2016, 4:45 PM

## 2016-03-06 NOTE — Anesthesia Procedure Notes (Signed)
Spinal  Patient location during procedure: OR Start time: 03/06/2016 7:50 AM End time: 03/06/2016 7:55 AM Staffing Anesthesiologist: Martha Clan Resident/CRNA: Rolla Plate Performed: resident/CRNA  Preanesthetic Checklist Completed: patient identified, site marked, surgical consent, pre-op evaluation, timeout performed, IV checked, risks and benefits discussed and monitors and equipment checked Spinal Block Patient position: sitting Prep: ChloraPrep Patient monitoring: heart rate, continuous pulse ox, blood pressure and cardiac monitor Approach: midline Location: L4-5 Injection technique: single-shot Needle Needle type: [place]acre and Introducer  Needle gauge: 24 G Needle length: 9 cm Assessment Sensory level: T10 Additional Notes Negative paresthesia. Negative blood return. Positive free-flowing CSF. Expiration date of kit checked and confirmed. Patient tolerated procedure well, without complications.

## 2016-03-06 NOTE — Anesthesia Post-op Follow-up Note (Cosign Needed)
Anesthesia QCDR form completed.        

## 2016-03-07 ENCOUNTER — Encounter: Payer: Self-pay | Admitting: Orthopedic Surgery

## 2016-03-07 LAB — BASIC METABOLIC PANEL
Anion gap: 5 (ref 5–15)
BUN: 35 mg/dL — ABNORMAL HIGH (ref 6–20)
CHLORIDE: 105 mmol/L (ref 101–111)
CO2: 25 mmol/L (ref 22–32)
CREATININE: 1.72 mg/dL — AB (ref 0.61–1.24)
Calcium: 7.3 mg/dL — ABNORMAL LOW (ref 8.9–10.3)
GFR calc non Af Amer: 37 mL/min — ABNORMAL LOW (ref >60)
GFR, EST AFRICAN AMERICAN: 43 mL/min — AB (ref >60)
Glucose, Bld: 115 mg/dL — ABNORMAL HIGH (ref 65–99)
Potassium: 4.6 mmol/L (ref 3.5–5.1)
Sodium: 135 mmol/L (ref 135–145)

## 2016-03-07 LAB — CBC
HEMATOCRIT: 25.1 % — AB (ref 40.0–52.0)
HEMOGLOBIN: 8.6 g/dL — AB (ref 13.0–18.0)
MCH: 33.2 pg (ref 26.0–34.0)
MCHC: 34.3 g/dL (ref 32.0–36.0)
MCV: 96.6 fL (ref 80.0–100.0)
Platelets: 157 10*3/uL (ref 150–440)
RBC: 2.6 MIL/uL — ABNORMAL LOW (ref 4.40–5.90)
RDW: 13.5 % (ref 11.5–14.5)
WBC: 5.3 10*3/uL (ref 3.8–10.6)

## 2016-03-07 MED ORDER — FE FUMARATE-B12-VIT C-FA-IFC PO CAPS
1.0000 | ORAL_CAPSULE | Freq: Three times a day (TID) | ORAL | Status: DC
  Administered 2016-03-07 – 2016-03-08 (×3): 1 via ORAL
  Filled 2016-03-07 (×3): qty 1

## 2016-03-07 NOTE — Progress Notes (Signed)
Patient is A&O x4. Up with assist x1 and [employer]. Tolerated regular diet. Gave oral pain meds x1 with good relief. Urinating without difficultly. NSL. Dressing to left hip is CD&I. No BM this shift. Lovenox teaching started.

## 2016-03-07 NOTE — Anesthesia Postprocedure Evaluation (Signed)
Anesthesia Post Note  Patient: Lawrence Perkins.  Procedure(s) Performed: Procedure(s) (LRB): TOTAL HIP ARTHROPLASTY ANTERIOR APPROACH (Left)  Patient location during evaluation: Nursing Unit Anesthesia Type: Spinal Level of consciousness: awake, awake and alert and oriented Pain management: pain level controlled Vital Signs Assessment: post-procedure vital signs reviewed and stable Respiratory status: spontaneous breathing Cardiovascular status: blood pressure returned to baseline Postop Assessment: no headache, no backache, adequate PO intake and no signs of nausea or vomiting Anesthetic complications: no     Last Vitals:  Vitals:   03/06/16 2301 03/07/16 0445  BP: (!) 162/73 104/64  Pulse: 76 68  Resp: 18 19  Temp: 37 C 36.8 C    Last Pain:  Vitals:   03/07/16 0608  TempSrc:   PainSc: 4                  Lawrence Perkins

## 2016-03-07 NOTE — Progress Notes (Signed)
Clinical Social Worker (CSW) received SNF consult. PT is recommending home health. RN case manager aware of above. Please reconsult if future social work needs arise. CSW signing off.   Gleason Ardoin, LCSW [phone] 

## 2016-03-07 NOTE — Progress Notes (Signed)
Physical Therapy Treatment Patient Details Name: Lawrence Perkins. MRN: [id number] DOB: [birth year] Today's Date: 03/07/2016    History of Present Illness Pt underwent L THR anterior approach without reported post-op complications. He has a history of prior R THR. POD#1 at time of OT evaluation.    PT Comments    Pt continues to present with minor deficits in strength, gait, mobility, and activity tolerance but is progressing well towards goals.  Pt able to performs all bed mobility tasks without physical assistance with decreased time and effort required this session compared to morning session.  Pt able to stand confidently from EOB without LOB.  Pt ambulated 2 x 125' with RW and SBA with mostly step-to gait but near the end of session gait pattern progressed to reciprocal pattern although cadence remains slow.  Pt able to ascend/descend 4 steps with BUEs on one rail with CGA and min verbal cues to ensure proper sequencing.  Pt's BP 162/73 mmHg after ambulation.  Pt will benefit from PT services to address above deficits for decreased caregiver assistance upon discharge.    Follow Up Recommendations  Home health PT     Equipment Recommendations  None recommended by PT    Recommendations for Other Services       Precautions / Restrictions Precautions Precautions: Anterior Hip;Fall Precaution Booklet Issued: Yes (comment) Precaution Comments: Anterior hip precaution education/review provided with pt recalling 1/3 hip precautions. Restrictions Weight Bearing Restrictions: Yes LLE Weight Bearing: Weight bearing as tolerated    Mobility  Bed Mobility Overal bed mobility: Needs Assistance Bed Mobility: Supine to Sit;Sit to Supine     Supine to sit: Supervision Sit to supine: Supervision   General bed mobility comments: No physical assist required during bed mobility training but pt did require extra time and effort to perform tasks although improved this  session  Transfers Overall transfer level: Needs assistance Equipment used: Rolling walker (2 wheeled) Transfers: Sit to/from Stand Sit to Stand: Supervision         General transfer comment: Pt confident and steady during sit to/from stand  Ambulation/Gait Ambulation/Gait assistance: Supervision Ambulation Distance (Feet): 125 Feet x 2 Assistive device: Rolling walker (2 wheeled) Gait Pattern/deviations: Step-to pattern   Gait velocity interpretation: Below normal speed for age/gender General Gait Details: Slow cadence with gait with step-to pattern that progressed to reciprocal pattern by end of session.   Stairs Stairs: Yes   Stair Management: One rail Right Number of Stairs: 4 General stair comments: BUEs on one rail with CGA, steady without LOB, min verbal cues for proper sequencing required  Wheelchair Mobility    Modified Rankin (Stroke Patients Only)       Balance Overall balance assessment: Needs assistance Sitting-balance support: No upper extremity supported Sitting balance-Leahy Scale: Normal     Standing balance support: No upper extremity supported Standing balance-Leahy Scale: Good Standing balance comment: Dynamic balance training this session with reaching outside BOS without LOB                    Cognition Arousal/Alertness: Awake/alert Behavior During Therapy: WFL for tasks assessed/performed Overall Cognitive Status: Within Functional Limits for tasks assessed                      Exercises Total Joint Exercises Ankle Circles/Pumps: AROM;Both;10 reps Quad Sets: AROM;Both;10 reps Gluteal Sets: AROM;10 reps Hip ABduction/ADduction: Left;Strengthening;10 reps (Isometric) Straight Leg Raises: AAROM;Left;10 reps Long Arc Quad: Strengthening;Left;10 reps Knee Flexion: Strengthening;Left;10 reps  Other Exercises Other Exercises: Dynamic balance training with reaching outside BOS without LOB    General Comments         Pertinent Vitals/Pain Pain Assessment: No/denies pain Pain Score: 3  Pain Location: L hip during amb, 0/10 pain at rest Pain Intervention(s): Monitored during session;Limited activity within patient's tolerance    Home Living                      Prior Function            PT Goals (current goals can now be found in the care plan section) Progress towards PT goals: Progressing toward goals    Frequency    BID      PT Plan Current plan remains appropriate    Co-evaluation             End of Session Equipment Utilized During Treatment: Gait belt Activity Tolerance: Patient tolerated treatment well Patient left: in bed;with bed alarm set;with family/visitor present;with call bell/phone within reach Nurse Communication: Mobility status       Time: 9604-5409 PT Time Calculation (min) (ACUTE ONLY): 40 min  Charges:  $Gait Training: 23-37 mins $Therapeutic Exercise: 8-22 mins                    G Codes:       DRoyetta Asal PT, DPT 03/07/16, 2:59 PM

## 2016-03-07 NOTE — Progress Notes (Signed)
Physical Therapy Treatment Patient Details Name: Lawrence Perkins. MRN: [id number] DOB: 13-Nov-1940 Today's Date: 03/07/2016    History of Present Illness Pt underwent L THR anterior approach without reported post-op complications. He has a history of prior R THR. POD#1 at time of OT evaluation.    PT Comments    Pt progressing well towards goal but continues to present with mild deficits in strength, gait, balance, mobility, and activity tolerance.  Pt required only SBA with bed mobility tasks this session but needed extra time and effort to complete tasks.  Pt able to stand with SBA and ambulated 80' with RW and SBA with slow cadence and mostly step-to pattern that did slowly progress towards reciprocal by end of session.  Pt will benefit from PT services to address above deficits for decreased caregiver assistance upon discharge.    Follow Up Recommendations  Home health PT     Equipment Recommendations  None recommended by PT    Recommendations for Other Services       Precautions / Restrictions Precautions Precautions: Anterior Hip;Fall Precaution Booklet Issued: Yes (comment) Precaution Comments: Anterior hip precaution education/review provided with pt recalling 1/3 hip precautions. Restrictions Weight Bearing Restrictions: Yes LLE Weight Bearing: Weight bearing as tolerated    Mobility  Bed Mobility Overal bed mobility: Needs Assistance Bed Mobility: Supine to Sit;Sit to Supine     Supine to sit: Supervision Sit to supine: Supervision   General bed mobility comments: No physical assist required during bed mobility training but pt did require extra time and effort to perform tasks  Transfers Overall transfer level: Needs assistance Equipment used: Rolling walker (2 wheeled) Transfers: Sit to/from Stand Sit to Stand: Supervision         General transfer comment: Min verbal cues for sequencing provided  Ambulation/Gait Ambulation/Gait assistance:  Supervision Ambulation Distance (Feet): 80 Feet Assistive device: Rolling walker (2 wheeled) Gait Pattern/deviations: Step-to pattern   Gait velocity interpretation: Below normal speed for age/gender General Gait Details: Slow cadence with gait with step-to pattern that progressed towards beginning reciprocal pattern by end of session.   Stairs            Wheelchair Mobility    Modified Rankin (Stroke Patients Only)       Balance Overall balance assessment: Needs assistance Sitting-balance support: No upper extremity supported Sitting balance-Leahy Scale: Good     Standing balance support: No upper extremity supported Standing balance-Leahy Scale: Good Standing balance comment: Good static balance with feet apart, together, and semi-tandem                    Cognition Arousal/Alertness: Awake/alert Behavior During Therapy: WFL for tasks assessed/performed Overall Cognitive Status: Within Functional Limits for tasks assessed                      Exercises Total Joint Exercises Ankle Circles/Pumps: AROM;Both;10 reps Quad Sets: AROM;Both;10 reps Gluteal Sets: AROM;10 reps Hip ABduction/ADduction: Left;Strengthening;10 reps (Isometric) Straight Leg Raises: AAROM;Left;10 reps Long Arc Quad: Strengthening;Left;10 reps Knee Flexion: Strengthening;Left;10 reps Other Exercises Other Exercises: Static balance training with feet apart, together, and semi-tandem with combinations of eyes open/closed and head still/head turns    General Comments        Pertinent Vitals/Pain Pain Assessment: No/denies pain Pain Score: 2  Pain Location: L hip Pain Descriptors / Indicators: Aching Pain Intervention(s): Limited activity within patient's tolerance;Monitored during session;Premedicated before session;Repositioned    Home Living Family/patient expects to be  discharged to:: Private residence Living Arrangements: Spouse/significant other Available Help at  Discharge: Family;Available 24 hours/day Type of Home: House Home Access: Stairs to enter Entrance Stairs-Rails: Right Home Layout: One level Home Equipment: Walker - 2 wheels;Cane - single point;Shower seat - built in;Hand held shower head      Prior Function Level of Independence: Independent      Comments: Independent with ADLs/IADLs. Drives, active. No falls in the last 12 months   PT Goals (current goals can now be found in the care plan section) Acute Rehab PT Goals Patient Stated Goal: Return to prior function at home Progress towards PT goals: Progressing toward goals    Frequency    BID      PT Plan Current plan remains appropriate    Co-evaluation             End of Session Equipment Utilized During Treatment: Gait belt Activity Tolerance: Patient tolerated treatment well Patient left: in bed;with bed alarm set;with call bell/phone within reach;with SCD's reapplied         Time: 6151-8343 PT Time Calculation (min) (ACUTE ONLY): 46 min  Charges:  $Gait Training: 8-22 mins $Therapeutic Exercise: 23-37 mins                    G Codes:       DRoyetta Asal PT, DPT 03/07/16, 12:13 PM

## 2016-03-07 NOTE — Progress Notes (Signed)
Pt is alert and oriented. Medicated for pain during the night with good results. Iv infusing without difficulty. Foley is out, void pending. Surgical dressing is dry and intact. Able to sleep in between care.

## 2016-03-07 NOTE — Progress Notes (Signed)
   Subjective: 1 Day Post-Op Procedure(s) (LRB): TOTAL HIP ARTHROPLASTY ANTERIOR APPROACH (Left) Patient reports pain as mild.   Patient is well, and has had no acute complaints or problems Denies any CP, SOB, ABD pain. We will continue therapy today.  Plan is to go Home after hospital stay.  Objective: Vital signs in last 24 hours: Temp:  [97.5 F (36.4 C)-98.6 F (37 C)] 98.3 F (36.8 C) (03/07 0445) Pulse Rate:  [53-99] 68 (03/07 0445) Resp:  [10-23] 19 (03/07 0445) BP: (102-187)/(63-89) 104/64 (03/07 0445) SpO2:  [92 %-100 %] 98 % (03/07 0445)  Intake/Output from previous day: 03/06 0701 - 03/07 0700 In: 4050 [P.O.:720; I.V.:3280; IV Piggyback:50] Out: 2488 [Urine:1350; Drains:138; Blood:1000] Intake/Output this shift: No intake/output data recorded.   Recent Labs  03/06/16 1057 03/07/16 0356  HGB 10.0* 8.6*    Recent Labs  03/06/16 1057 03/07/16 0356  WBC 7.0 5.3  RBC 2.99* 2.60*  HCT 28.9* 25.1*  PLT 168 157    Recent Labs  03/06/16 1057 03/07/16 0356  NA  --  135  K  --  4.6  CL  --  105  CO2  --  25  BUN  --  35*  CREATININE 1.71* 1.72*  GLUCOSE  --  115*  CALCIUM  --  7.3*   No results for input(s): LABPT, INR in the last 72 hours.  EXAM General - Patient is Alert, Appropriate and Oriented Extremity - Neurovascular intact Sensation intact distally Intact pulses distally Dorsiflexion/Plantar flexion intact Incision: dressing C/D/I, no drainage and hemovac intact No cellulitis present Compartment soft Dressing - dressing C/D/I and no drainage Motor Function - intact, moving foot and toes well on exam.   Past Medical History:  Diagnosis Date  . Chronic kidney disease   . Complication of anesthesia    has converted to afib during and/or after surgery in the past  . Diverticulosis   . Dysrhythmia    afib  . Gout   . Hypercholesteremia   . Hypertension   . 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 MGUS (monoclonal  gammopathy of unknown significance)   . Prostatism   . Rectal bleeding     Assessment/Plan:   1 Day Post-Op Procedure(s) (LRB): TOTAL HIP ARTHROPLASTY ANTERIOR APPROACH (Left) Active Problems:   Primary osteoarthritis of left hip  Estimated body mass index is 28.07 kg/m as calculated from the following:   Height as of this encounter: 6' (1.829 m).   Weight as of this encounter: 93.9 kg (207 lb). Advance diet Up with therapy  Needs BM Acute post op blood loss anemia - start Fe supplement. Recheck labs in the am CM to assist with discharge  DVT Prophylaxis - Aspirin and Lovenox Weight-Bearing as tolerated to left leg   T. Rachelle Hora, PA-C [place] 03/07/2016, 8:02 AM

## 2016-03-07 NOTE — Care Management Note (Signed)
Case Management Note  Patient Details  Name: Lawrence Perkins. MRN: [id number] Date of Birth: 03-21-1940  Subjective/Objective:                  Met with patient to discuss discharge planning. He plans to return home with his wife at discharge. He has a walker already at home from previous surgery. He would like to use [facility] for home health as he does not remember last home health PT agency he used- he doesn't have a preference as long as his insurance is in-network. [facility] [address].   Action/Plan: Home health list provided to patient. [facility] is Scientist, clinical (histocompatibility and immunogenetics) for eligibility- appears to [employer]. Lovenox 108m #14 called in per Dr. MRudene [phone].   Expected Discharge Date:                  Expected Discharge Plan:     In-House Referral:     Discharge planning Services  CM Consult  Post Acute Care Choice:  Home Health Choice offered to:  Patient  DME Arranged:    DME Agency:     HH Arranged:  [facility]  Status of Service:  In process, will continue to follow  If discussed at Long Length of Stay Meetings, dates discussed:    Additional Comments:  AMarshell Garfinkel RN 03/07/2016, 11:36 AM

## 2016-03-07 NOTE — Evaluation (Signed)
Occupational Therapy Evaluation Patient Details Name: Lawrence Perkins. MRN: [id number] DOB: 07-May-1940 Today's Date: 03/07/2016    History of Present Illness Pt underwent L THR anterior approach without reported post-op complications. He has a history of prior R THR. POD#1 at time of OT evaluation.   Clinical Impression   Pt is 76 year old male s/p L THR(direct anterior approach) who lives at home with his spouse. Pt was independent in all ADLs driving, and staying active (water aerobics) prior to surgery and is eager to return to PLOF.  Pt is currently limited in functional ADLs due to decreased ROM/strength and pain.  Pt requires min-mod assist for LB dressing and bathing skills due to pain and decreased AROM of LLE and would benefit from continued skilled OT services for education in assistive devices, functional mobility, and education in recommendations for home modifications to increase safety and prevent falls. No OT follow up once appropriate to discharge from hospital.       Follow Up Recommendations  No OT follow up (pt educated in [facility] benefits/role and pt politely declined need for services)    Equipment Recommendations  None recommended by OT    Recommendations for Other Services       Precautions / Restrictions Precautions Precautions: Anterior Hip Precaution Booklet Issued: No Restrictions Weight Bearing Restrictions: Yes LLE Weight Bearing: Weight bearing as tolerated      Mobility Bed Mobility Overal bed mobility: Needs Assistance Bed Mobility: Supine to Sit;Sit to Supine     Supine to sit: Min assist;HOB elevated Sit to supine: Min assist   General bed mobility comments: Min A for support of LLE during transitions and use of bed rails   Transfers Overall transfer level: Needs assistance Equipment used: Rolling walker (2 wheeled) Transfers: Sit to/from Stand Sit to Stand: Min guard         General transfer comment: verbal cues for safe hand  placement and keeping RW on ground (instead of lifting slightly when turning)    Balance Overall balance assessment: Needs assistance Sitting-balance support: No upper extremity supported Sitting balance-Leahy Scale: Good     Standing balance support: Single extremity supported;During functional activity Standing balance-Leahy Scale: Good                              ADL Overall ADL's : Needs assistance/impaired Eating/Feeding: Sitting;Set up   Grooming: Standing;Set up;Wash/dry face;Wash/dry hands;Oral care;Min guard;Supervision/safety Grooming Details (indicate cue type and reason): Pt performed grooming tasks standing at sink with supervision-min guard with no LOB and ocassional UE support on sink. Upper Body Bathing: Sitting;Set up   Lower Body Bathing: Minimal assistance;Sitting/lateral leans   Upper Body Dressing : Set up;Sitting   Lower Body Dressing: Minimal assistance;Sitting/lateral leans;Sit to/from stand;Moderate assistance Lower Body Dressing Details (indicate cue type and reason): Pt able to doff/don R sock with lateral leans with set up, required min-mod assist for doff/donning L sock during lateral leans after set up Toilet Transfer: Ambulation;BSC;RW;Min guard;Cueing for safety Toilet Transfer Details (indicate cue type and reason): Pt performed toilet transfer to [facility] in room using RW for ambulation and requiring min guard and verbal cues for hand placement to maximize safety.  Toileting- Clothing Manipulation and Hygiene: Sitting/lateral lean;Set up       Functional mobility during ADLs: Min guard;Rolling walker General ADL Comments: Pt generally min assist for LB ADL, declined education/training in use of AE for LB ADL, pt stating his  spouse could help with socks     Vision Baseline Vision/History: Wears glasses Wears Glasses: At all times Patient Visual Report: No change from baseline Vision Assessment?: No apparent visual deficits      Perception     Praxis Praxis Praxis tested?: Within functional limits    Pertinent Vitals/Pain Pain Assessment: 0-10 Pain Score: 2  Pain Location: L hip Pain Descriptors / Indicators: Aching Pain Intervention(s): Limited activity within patient's tolerance;Monitored during session;Premedicated before session;Repositioned     Hand Dominance Right   Extremity/Trunk Assessment Upper Extremity Assessment Upper Extremity Assessment: Overall WFL for tasks assessed   Lower Extremity Assessment Lower Extremity Assessment: Defer to PT evaluation;LLE deficits/detail   Cervical / Trunk Assessment Cervical / Trunk Assessment: Normal   Communication Communication Communication: No difficulties   Cognition Arousal/Alertness: Awake/alert Behavior During Therapy: WFL for tasks assessed/performed Overall Cognitive Status: Within Functional Limits for tasks assessed                     General Comments       Exercises       Shoulder Instructions      Home Living Family/patient expects to be discharged to:: Private residence Living Arrangements: Spouse/significant other Available Help at Discharge: Family;Available 24 hours/day Type of Home: House Home Access: Stairs to enter [employer] of Steps: 4 Entrance Stairs-Rails: Right Home Layout: One level     Bathroom Shower/Tub: Walk-in shower;Door   [employer] Toilet: Standard Bathroom Accessibility: Yes How Accessible: Accessible via walker Home Equipment: Walker - 2 wheels;Cane - single point;Shower seat - built in;Hand held shower head          Prior Functioning/Environment Level of Independence: Independent        Comments: Independent with ADLs/IADLs. Drives, active. No falls in the last 12 months        OT Problem List: Decreased strength;Pain;Decreased activity tolerance;Decreased knowledge of use of DME or AE      OT Treatment/Interventions: Self-care/ADL training;Therapeutic  exercise;Therapeutic activities;DME and/or AE instruction;Energy conservation;Patient/family education    OT Goals(Current goals can be found in the care plan section) Acute Rehab OT Goals Patient Stated Goal: Return to prior function at home OT Goal Formulation: With patient Time For Goal Achievement: 03/14/16 Potential to Achieve Goals: Good  OT Frequency: Min 1X/week   Barriers to D/C:            Co-evaluation              End of Session Equipment Utilized During Treatment: Gait belt;Rolling walker  Activity Tolerance: Patient tolerated treatment well Patient left: in bed;with call bell/phone within reach;with bed alarm set (pt declined SCD's be reapplied)  OT Visit Diagnosis: Other abnormalities of gait and mobility (R26.89);Muscle weakness (generalized) (M62.81);Pain Pain - Right/Left: Left Pain - part of body: Hip                ADL either performed or assessed with clinical judgement  Time: 0823-0903 OT Time Calculation (min): 40 min Charges:  OT General Charges $OT Visit: 1 Procedure OT Evaluation $OT Eval Low Complexity: 1 Procedure OT Treatments $Self Care/Home Management : 23-37 mins G-Codes:     Jeni Salles, MPH, MS, OTR/L ascom [phone] 03/07/16, 10:09 AM

## 2016-03-08 LAB — BASIC METABOLIC PANEL
Anion gap: 3 — ABNORMAL LOW (ref 5–15)
BUN: 31 mg/dL — AB (ref 6–20)
CHLORIDE: 107 mmol/L (ref 101–111)
CO2: 24 mmol/L (ref 22–32)
Calcium: 8 mg/dL — ABNORMAL LOW (ref 8.9–10.3)
Creatinine, Ser: 1.66 mg/dL — ABNORMAL HIGH (ref 0.61–1.24)
GFR calc non Af Amer: 38 mL/min — ABNORMAL LOW (ref >60)
GFR, EST AFRICAN AMERICAN: 45 mL/min — AB (ref >60)
Glucose, Bld: 106 mg/dL — ABNORMAL HIGH (ref 65–99)
POTASSIUM: 4.5 mmol/L (ref 3.5–5.1)
SODIUM: 134 mmol/L — AB (ref 135–145)

## 2016-03-08 LAB — CBC
HEMATOCRIT: 24.7 % — AB (ref 40.0–52.0)
HEMOGLOBIN: 8.3 g/dL — AB (ref 13.0–18.0)
MCH: 32.3 pg (ref 26.0–34.0)
MCHC: 33.8 g/dL (ref 32.0–36.0)
MCV: 95.7 fL (ref 80.0–100.0)
Platelets: 144 10*3/uL — ABNORMAL LOW (ref 150–440)
RBC: 2.58 MIL/uL — AB (ref 4.40–5.90)
RDW: 13.4 % (ref 11.5–14.5)
WBC: 4.9 10*3/uL (ref 3.8–10.6)

## 2016-03-08 LAB — SURGICAL PATHOLOGY

## 2016-03-08 MED ORDER — ENOXAPARIN SODIUM 40 MG/0.4ML ~~LOC~~ SOLN: 40.0000 mg | SUBCUTANEOUS | 0 refills | Status: DC

## 2016-03-08 MED ORDER — FE FUMARATE-B12-VIT C-FA-IFC PO CAPS: 1.0000 | ORAL_CAPSULE | Freq: Three times a day (TID) | ORAL | 0 refills | Status: DC

## 2016-03-08 MED ORDER — OXYCODONE HCL 5 MG PO TABS: 5.0000 mg | ORAL_TABLET | ORAL | 0 refills | Status: DC | PRN

## 2016-03-08 NOTE — Care Management (Addendum)
Cost of Lovenox os $85.00. Pharmacy tech stated they notified patient. Patient agrees with POC.

## 2016-03-08 NOTE — Discharge Summary (Signed)
Physician Discharge Summary  Patient ID: Lawrence Perkins. MRN: [id number] DOB/AGE: 77-Sep-1942 77 y.o.  Admit date: 03/06/2016 Discharge date: 03/08/2016  Admission Diagnoses:  PRIMARY OSTEOARTHRITIS OF LEFT HIP   Discharge Diagnoses: Patient Active Problem List   Diagnosis Date Noted  . Primary osteoarthritis of left hip 03/06/2016  . MGUS (monoclonal gammopathy of unknown significance) 10/31/2015  . ATRIAL FIBRILLATION 02/14/2010  . CEREBROVASCULAR ACCIDENT, HX OF 02/14/2010    Past Medical History:  Diagnosis Date  . Chronic kidney disease   . Complication of anesthesia    has converted to afib during and/or after surgery in the past  . Diverticulosis   . Dysrhythmia    afib  . Gout   . Hypercholesteremia   . Hypertension   . Melanoma ([place])    status post resection from his back  . MGUS (monoclonal gammopathy of unknown significance)   . Prostatism   . Rectal bleeding      Transfusion: none   Consultants (if any):   Discharged Condition: Improved  Hospital Course: Lawrence Borin. is an 76 y.o. male who was admitted 03/06/2016 with a diagnosis of <principal problem not specified> and went to the operating room on 03/06/2016 and underwent the above named procedures.    Surgeries: Procedure(s): TOTAL HIP ARTHROPLASTY ANTERIOR APPROACH on 03/06/2016 Patient tolerated the surgery well. Taken to PACU where she was stabilized and then transferred to the orthopedic floor.  Started on Lovenox 40 q 24 hrs. Foot pumps applied bilaterally at 80 mm. Heels elevated on bed with rolled towels. No evidence of DVT. Negative Homan. Physical therapy started on day #1 for gait training and transfer. OT started day #1 for ADL and assisted devices.  Patient's foley was d/c on day #1. Patient's IV and hemovac was d/c on day #2.  On post op day #2 patient was stable and ready for discharge to home with HHPT.  Implants: Medacta AMIS 8 standard stem S 28 mm head with 48mm Mpact DM cup  and liner  He was given perioperative antibiotics:  Anti-infectives    Start     Dose/Rate Route Frequency Ordered Stop   03/06/16 1430  ceFAZolin (ANCEF) IVPB 2 g/50 mL premix     2 g 100 mL/hr over 30 Minutes Intravenous Every 6 hours 03/06/16 1418 03/07/16 0204   03/06/16 1400  ceFAZolin (ANCEF) IVPB 2g/100 mL premix  Status:  Discontinued     2 g 200 mL/hr over 30 Minutes Intravenous Every 6 hours 03/06/16 1040 03/06/16 1418   03/06/16 0553  ceFAZolin (ANCEF) 2-4 GM/100ML-% IVPB    Comments:  LEWIS, CINDY: cabinet override      03/06/16 0553 03/06/16 0801   03/05/16 2345  ceFAZolin (ANCEF) IVPB 2g/100 mL premix     2 g 200 mL/hr over 30 Minutes Intravenous  Once 03/05/16 2334 03/06/16 0816    .  He was given sequential compression devices, early ambulation, and Lovenox for DVT prophylaxis.  He benefited maximally from the hospital stay and there were no complications.    Recent vital signs:  Vitals:   03/07/16 2025 03/08/16 0454  BP: (!) 180/81 (!) 155/80  Pulse: 67 69  Resp:  18  Temp:  98.5 F (36.9 C)    Recent laboratory studies:  Lab Results  Component Value Date   HGB 8.3 (L) 03/08/2016   HGB 8.6 (L) 03/07/2016   HGB 10.0 (L) 03/06/2016   Lab Results  Component Value Date   WBC 4.9  03/08/2016   PLT 144 (L) 03/08/2016   Lab Results  Component Value Date   INR 1.11 02/22/2016   Lab Results  Component Value Date   NA 134 (L) 03/08/2016   K 4.5 03/08/2016   CL 107 03/08/2016   CO2 24 03/08/2016   BUN 31 (H) 03/08/2016   CREATININE 1.66 (H) 03/08/2016   GLUCOSE 106 (H) 03/08/2016    Discharge Medications:   Allergies as of 03/08/2016   No Known Allergies     Medication List    TAKE these medications   alendronate 70 MG tablet Commonly known as:  FOSAMAX Take 70 mg by mouth every Sunday. Take with a full glass of water on an empty stomach.   aspirin EC 81 MG tablet Take 81 mg by mouth at bedtime.   carvedilol 12.5 MG tablet Commonly known  as:  COREG Take 12.5 mg by mouth 2 (two) times daily.   enoxaparin 40 MG/0.4ML injection Commonly known as:  LOVENOX Inject 0.4 mLs (40 mg total) into the skin daily.   ferrous fumarate-b12-vitamic C-folic acid capsule Commonly known as:  TRINSICON / FOLTRIN Take 1 capsule by mouth 3 (three) times daily after meals.   NYQUIL PO Take 2 capsules by mouth at bedtime as needed (for cold symptoms.).   oxyCODONE 5 MG immediate release tablet Commonly known as:  Oxy IR/ROXICODONE Take 1-2 tablets (5-10 mg total) by mouth every 3 (three) hours as needed for breakthrough pain.   tamsulosin 0.4 MG Caps capsule Commonly known as:  FLOMAX Take 1 capsule (0.4 mg total) by mouth daily.   Vitamin D3 2000 units Tabs Take 5,000 Units by mouth daily.            Durable Medical Equipment        Start     Ordered   03/06/16 1041  DME 3 n 1  Once     03 /06/18 1040   03/06/16 1041  DME Bedside commode  Once    Question:  Patient needs a bedside commode to treat with the following condition  Answer:  Status post total hip replacement, left   03/06/16 1040   03/06/16 1041  DME Walker rolling  Once    Question:  Patient needs a walker to treat with the following condition  Answer:  Status post total hip replacement, left   03/06/16 1040      Diagnostic Studies: Dg Hip Operative Unilat W Or W/o Pelvis Left  Result Date: 03/06/2016 CLINICAL DATA:  Left total hip replacement, anterior approach EXAM: OPERATIVE left HIP (WITH PELVIS IF PERFORMED) 1 VIEWS TECHNIQUE: Fluoroscopic spot image(s) were submitted for interpretation post-operatively. COMPARISON:  Pelvis film of 01/05/2014 FINDINGS: A single C-arm spot film shows the acetabular and femoral components of the left total hip replacement to be in good position. No complicating features are seen. The entire femoral stem is not included on the field of view. IMPRESSION: Left total hip replacement components in good position on single C-arm spot  film obtained. Electronically Signed   By: Ivar Drape M.D.   On: 03/06/2016 09:24   Dg Hip Unilat W Or W/o Pelvis 2-3 Views Left  Result Date: 03/06/2016 CLINICAL DATA:  Left total hip replacement EXAM: DG HIP (WITH OR WITHOUT PELVIS) 2-3V LEFT COMPARISON:  Pelvis film of 01/05/2014 FINDINGS: The femoral and acetabular components of the left total hip replacement are in good position. No complicating features are seen. IMPRESSION: Left total hip replacement components in good position. No  complicating features. Electronically Signed   By: Ivar Drape M.D.   On: 03/06/2016 10:16    Disposition:     Follow-up Information    MENZ,Shaan, MD Follow up in 2 week(s).   Specialty:  Orthopedic Surgery Contact information: [place] [address] [phone]            Signed: Dorise Hiss [facility] 03/08/2016, 8:04 AM

## 2016-03-08 NOTE — Progress Notes (Signed)
Patient was discharged home with wife. Reviewed last dose meds given, incision care, scripts, and activity. IV removed with cath intact. Lovenox reviewed again at discharge. Allowed time for questions. Sent extra dressing with patient.

## 2016-03-08 NOTE — Progress Notes (Signed)
Physical Therapy Treatment Patient Details Name: Lawrence Perkins. MRN: [id number] DOB: 25-Sep-1940 Today's Date: 03/08/2016    History of Present Illness Pt underwent L THR anterior approach without reported post-op complications. He has a history of prior R THR.    PT Comments    Pt demonstrates excellent progress with physical therapy on this date. He is able to complete a full lap around RN station with rolling walker. Progresses with cues to symmetrical stepping pattern. He is also able to increase his speed and decrease his UE reliance. Pt reports he feels confident with the stairs and does not need to practice again. He completed stair training yesterday with therapist. Pt able to complete all seated exercises with gradually improving L hip flexion strength. He has completed all PT barriers for discharge and is safe to return home with wife and [facility] PT when medically stable. Pt will benefit from skilled PT services to address deficits in strength, balance, and mobility in order to return to full function at home.    Follow Up Recommendations  Home health PT     Equipment Recommendations  None recommended by PT    Recommendations for Other Services OT consult     Precautions / Restrictions Precautions Precautions: Anterior Hip;Fall Precaution Booklet Issued: Yes (comment) Precaution Comments: Anterior hip precaution education/review provided with pt recalling 1/3 hip precautions. Restrictions Weight Bearing Restrictions: Yes LLE Weight Bearing: Weight bearing as tolerated    Mobility  Bed Mobility               General bed mobility comments: Pt received uprigh at sink with CNA. Left upright in recliner  Transfers Overall transfer level: Needs assistance Equipment used: Rolling walker (2 wheeled) Transfers: Sit to/from Stand Sit to Stand: Supervision         General transfer comment: Good speed/sequencing with transfers. Good stability when upright in  standing. Able to balance without UE support  Ambulation/Gait Ambulation/Gait assistance: Supervision Ambulation Distance (Feet): 220 Feet Assistive device: Rolling walker (2 wheeled) Gait Pattern/deviations: Step-through pattern Gait velocity: Decreased but WFL for limited community mobility Gait velocity interpretation: <1.8 ft/sec, indicative of risk for recurrent falls General Gait Details: Pt able to complete a full lap around RN station. Cues provided to increase R step length and progress to symmetrical stepping pattern. Pt also encouraged to decrease UE reliance. Pt denies increase in pain with ambulation and VSS   Stairs            Wheelchair Mobility    Modified Rankin (Stroke Patients Only)       Balance Overall balance assessment: Needs assistance Sitting-balance support: No upper extremity supported Sitting balance-Leahy Scale: Normal     Standing balance support: No upper extremity supported Standing balance-Leahy Scale: Fair Standing balance comment: Able to maintain balance without UE support in order to brush teeth at sink                    Cognition Arousal/Alertness: Awake/alert Behavior During Therapy: WFL for tasks assessed/performed Overall Cognitive Status: Within Functional Limits for tasks assessed                      Exercises Total Joint Exercises Hip ABduction/ADduction: Strengthening;Both;Seated;20 reps (Isometric) Long Arc Quad: Strengthening;Left;20 reps;Seated Knee Flexion: Strengthening;Left;20 reps;Seated Marching in Standing: Strengthening;Both;20 reps;Seated    General Comments        Pertinent Vitals/Pain Pain Assessment: 0-10 Pain Score: 3  Pain Location: L hip Pain  Descriptors / Indicators: Aching Pain Intervention(s): Monitored during session;Premedicated before session    Home Living                      Prior Function            PT Goals (current goals can now be found in the care  plan section) Acute Rehab PT Goals Patient Stated Goal: Return to prior function at home PT Goal Formulation: With patient/family Time For Goal Achievement: 03/20/16 Potential to Achieve Goals: Good Progress towards PT goals: Progressing toward goals    Frequency    BID      PT Plan Current plan remains appropriate    Co-evaluation             End of Session Equipment Utilized During Treatment: Gait belt Activity Tolerance: Patient tolerated treatment well Patient left: with call bell/phone within reach;in chair;with chair alarm set Nurse Communication: Mobility status PT Visit Diagnosis: Unsteadiness on feet (R26.81);Muscle weakness (generalized) (M62.81)     Time: 1638-4536 PT Time Calculation (min) (ACUTE ONLY): 25 min  Charges:  $Gait Training: 8-22 mins $Therapeutic Exercise: 8-22 mins                    G Codes:      Lyndel Safe Yordan [place] PT, DPT   Khary Schaben 03/08/2016, 11:38 AM

## 2016-03-08 NOTE — Discharge Instructions (Signed)

## 2016-03-08 NOTE — Progress Notes (Signed)
   Subjective: 2 Days Post-Op Procedure(s) (LRB): TOTAL HIP ARTHROPLASTY ANTERIOR APPROACH (Left) Patient reports pain as mild.   Patient is well, and has had no acute complaints or problems Denies any CP, SOB, ABD pain. We will continue therapy today.  Plan is to go Home after hospital stay.  Objective: Vital signs in last 24 hours: Temp:  [97.6 F (36.4 C)-98.6 F (37 C)] 98.5 F (36.9 C) (03/08 0454) Pulse Rate:  [67-77] 69 (03/08 0454) Resp:  [18] 18 (03/08 0454) BP: (153-191)/(70-82) 155/80 (03/08 0454) SpO2:  [96 %-97 %] 97 % (03/08 0454)  Intake/Output from previous day: 03/07 0701 - 03/08 0700 In: 240 [P.O.:240] Out: 1220 [Urine:1150; Drains:70] Intake/Output this shift: No intake/output data recorded.   Recent Labs  03/06/16 1057 03/07/16 0356 03/08/16 0321  HGB 10.0* 8.6* 8.3*    Recent Labs  03/07/16 0356 03/08/16 0321  WBC 5.3 4.9  RBC 2.60* 2.58*  HCT 25.1* 24.7*  PLT 157 144*    Recent Labs  03/07/16 0356 03/08/16 0321  NA 135 134*  K 4.6 4.5  CL 105 107  CO2 25 24  BUN 35* 31*  CREATININE 1.72* 1.66*  GLUCOSE 115* 106*  CALCIUM 7.3* 8.0*   No results for input(s): LABPT, INR in the last 72 hours.  EXAM General - Patient is Alert, Appropriate and Oriented Extremity - Neurovascular intact Sensation intact distally Intact pulses distally Dorsiflexion/Plantar flexion intact Incision: dressing C/D/I, no drainage and hemovac intact No cellulitis present Compartment soft Dressing - dressing C/D/I and no drainage Motor Function - intact, moving foot and toes well on exam.   Past Medical History:  Diagnosis Date  . Chronic kidney disease   . Complication of anesthesia    has converted to afib during and/or after surgery in the past  . Diverticulosis   . Dysrhythmia    afib  . Gout   . Hypercholesteremia   . Hypertension   . 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 MGUS (monoclonal gammopathy of unknown  significance)   . Prostatism   . Rectal bleeding     Assessment/Plan:   2 Days Post-Op Procedure(s) (LRB): TOTAL HIP ARTHROPLASTY ANTERIOR APPROACH (Left) Active Problems:   Primary osteoarthritis of left hip  Estimated body mass index is 28.07 kg/m as calculated from the following:   Height as of this encounter: 6' (1.829 m).   Weight as of this encounter: 93.9 kg (207 lb). Advance diet Up with therapy  Acute post op blood loss anemia -Hgb stable. continue with Fe Discharge home with HHPT today pending completion of PT goals  DVT Prophylaxis - Aspirin and Lovenox Weight-Bearing as tolerated to left leg   T. Rachelle Hora, PA-C [place] 03/08/2016, 8:00 AM

## 2016-03-10 DIAGNOSIS — Z7901 Long term (current) use of anticoagulants: Secondary | ICD-10-CM | POA: Diagnosis not present

## 2016-03-10 DIAGNOSIS — I4891 Unspecified atrial fibrillation: Secondary | ICD-10-CM | POA: Diagnosis not present

## 2016-03-10 DIAGNOSIS — D472 Monoclonal gammopathy: Secondary | ICD-10-CM | POA: Diagnosis not present

## 2016-03-10 DIAGNOSIS — Z96643 Presence of artificial hip joint, bilateral: Secondary | ICD-10-CM | POA: Diagnosis not present

## 2016-03-10 DIAGNOSIS — Z8673 Personal history of transient ischemic attack (TIA), and cerebral infarction without residual deficits: Secondary | ICD-10-CM | POA: Diagnosis not present

## 2016-03-10 DIAGNOSIS — Z471 Aftercare following joint replacement surgery: Secondary | ICD-10-CM | POA: Diagnosis not present

## 2016-03-10 DIAGNOSIS — Z79891 Long term (current) use of opiate analgesic: Secondary | ICD-10-CM | POA: Diagnosis not present

## 2016-03-10 DIAGNOSIS — I10 Essential (primary) hypertension: Secondary | ICD-10-CM | POA: Diagnosis not present

## 2016-03-10 DIAGNOSIS — Z7982 Long term (current) use of aspirin: Secondary | ICD-10-CM | POA: Diagnosis not present

## 2016-03-19 DIAGNOSIS — Z8673 Personal history of transient ischemic attack (TIA), and cerebral infarction without residual deficits: Secondary | ICD-10-CM | POA: Diagnosis not present

## 2016-03-19 DIAGNOSIS — Z79891 Long term (current) use of opiate analgesic: Secondary | ICD-10-CM | POA: Diagnosis not present

## 2016-03-19 DIAGNOSIS — I10 Essential (primary) hypertension: Secondary | ICD-10-CM | POA: Diagnosis not present

## 2016-03-19 DIAGNOSIS — Z7901 Long term (current) use of anticoagulants: Secondary | ICD-10-CM | POA: Diagnosis not present

## 2016-03-19 DIAGNOSIS — Z96643 Presence of artificial hip joint, bilateral: Secondary | ICD-10-CM | POA: Diagnosis not present

## 2016-03-19 DIAGNOSIS — I4891 Unspecified atrial fibrillation: Secondary | ICD-10-CM | POA: Diagnosis not present

## 2016-03-19 DIAGNOSIS — Z7982 Long term (current) use of aspirin: Secondary | ICD-10-CM | POA: Diagnosis not present

## 2016-03-19 DIAGNOSIS — Z471 Aftercare following joint replacement surgery: Secondary | ICD-10-CM | POA: Diagnosis not present

## 2016-03-19 DIAGNOSIS — D472 Monoclonal gammopathy: Secondary | ICD-10-CM | POA: Diagnosis not present

## 2016-04-09 ENCOUNTER — Emergency Department: Payer: PPO

## 2016-04-09 ENCOUNTER — Encounter: Payer: Self-pay | Admitting: Emergency Medicine

## 2016-04-09 ENCOUNTER — Emergency Department
Admission: EM | Admit: 2016-04-09 | Discharge: 2016-04-09 | Disposition: A | Discharge status: Home / Self Care | Payer: PPO | Attending: Student in an Organized Health Care Education/Training Program | Admitting: Student in an Organized Health Care Education/Training Program

## 2016-04-09 DIAGNOSIS — Z87891 Personal history of nicotine dependence: Secondary | ICD-10-CM | POA: Insufficient documentation

## 2016-04-09 DIAGNOSIS — Z85820 Personal history of malignant melanoma of skin: Secondary | ICD-10-CM | POA: Diagnosis not present

## 2016-04-09 DIAGNOSIS — I129 Hypertensive chronic kidney disease with stage 1 through stage 4 chronic kidney disease, or unspecified chronic kidney disease: Secondary | ICD-10-CM | POA: Insufficient documentation

## 2016-04-09 DIAGNOSIS — Z79899 Other long term (current) drug therapy: Secondary | ICD-10-CM | POA: Insufficient documentation

## 2016-04-09 DIAGNOSIS — J90 Pleural effusion, not elsewhere classified: Secondary | ICD-10-CM | POA: Diagnosis not present

## 2016-04-09 DIAGNOSIS — Z7982 Long term (current) use of aspirin: Secondary | ICD-10-CM | POA: Diagnosis not present

## 2016-04-09 DIAGNOSIS — N189 Chronic kidney disease, unspecified: Secondary | ICD-10-CM | POA: Insufficient documentation

## 2016-04-09 DIAGNOSIS — R0602 Shortness of breath: Secondary | ICD-10-CM | POA: Diagnosis not present

## 2016-04-09 LAB — COMPREHENSIVE METABOLIC PANEL
ALT: 8 U/L — ABNORMAL LOW (ref 17–63)
AST: 12 U/L — AB (ref 15–41)
Albumin: 3.1 g/dL — ABNORMAL LOW (ref 3.5–5.0)
Alkaline Phosphatase: 67 U/L (ref 38–126)
Anion gap: 3 — ABNORMAL LOW (ref 5–15)
BILIRUBIN TOTAL: 0.5 mg/dL (ref 0.3–1.2)
BUN: 27 mg/dL — AB (ref 6–20)
CO2: 25 mmol/L (ref 22–32)
CREATININE: 1.56 mg/dL — AB (ref 0.61–1.24)
Calcium: 8.5 mg/dL — ABNORMAL LOW (ref 8.9–10.3)
Chloride: 109 mmol/L (ref 101–111)
GFR calc Af Amer: 48 mL/min — ABNORMAL LOW (ref >60)
GFR, EST NON AFRICAN AMERICAN: 41 mL/min — AB (ref >60)
Glucose, Bld: 85 mg/dL (ref 65–99)
POTASSIUM: 4.7 mmol/L (ref 3.5–5.1)
Sodium: 137 mmol/L (ref 135–145)
TOTAL PROTEIN: 5.9 g/dL — AB (ref 6.5–8.1)

## 2016-04-09 LAB — GLUCOSE, PLEURAL OR PERITONEAL FLUID: Glucose, Fluid: 106 mg/dL

## 2016-04-09 LAB — CBC
HEMATOCRIT: 35.9 % — AB (ref 40.0–52.0)
Hemoglobin: 12.4 g/dL — ABNORMAL LOW (ref 13.0–18.0)
MCH: 34.1 pg — ABNORMAL HIGH (ref 26.0–34.0)
MCHC: 34.6 g/dL (ref 32.0–36.0)
MCV: 98.7 fL (ref 80.0–100.0)
PLATELETS: 253 10*3/uL (ref 150–440)
RBC: 3.63 MIL/uL — ABNORMAL LOW (ref 4.40–5.90)
RDW: 14.2 % (ref 11.5–14.5)
WBC: 5.4 10*3/uL (ref 3.8–10.6)

## 2016-04-09 LAB — BODY FLUID CELL COUNT WITH DIFFERENTIAL
Eos, Fluid: 0 %
Lymphs, Fluid: 94 %
MONOCYTE-MACROPHAGE-SEROUS FLUID: 6 %
NEUTROPHIL FLUID: 0 %
Other Cells, Fluid: 0 %
WBC FLUID: 290 uL

## 2016-04-09 LAB — BRAIN NATRIURETIC PEPTIDE: B NATRIURETIC PEPTIDE 5: 374 pg/mL — AB (ref 0.0–100.0)

## 2016-04-09 LAB — LACTATE DEHYDROGENASE, PLEURAL OR PERITONEAL FLUID: LD FL: 50 U/L — AB (ref 3–23)

## 2016-04-09 LAB — TROPONIN I

## 2016-04-09 LAB — PROTEIN, PLEURAL OR PERITONEAL FLUID: Total protein, fluid: <3 g/dL

## 2016-04-09 MED ORDER — AMLODIPINE BESYLATE 5 MG PO TABS
5.0000 mg | ORAL_TABLET | Freq: Once | ORAL | Status: AC
  Administered 2016-04-09: 5 mg via ORAL
  Filled 2016-04-09: qty 1

## 2016-04-09 MED ORDER — NITROGLYCERIN 2 % TD OINT
1.0000 [in_us] | TOPICAL_OINTMENT | Freq: Four times a day (QID) | TRANSDERMAL | Status: DC
  Administered 2016-04-09: 1 [in_us] via TOPICAL
  Filled 2016-04-09: qty 1

## 2016-04-09 MED ORDER — BUPIVACAINE HCL (PF) 0.25 % IJ SOLN
INTRAMUSCULAR | Status: AC
  Administered 2016-04-09: 30 mL
  Filled 2016-04-09: qty 30

## 2016-04-09 MED ORDER — IOPAMIDOL (ISOVUE-370) INJECTION 76%: 75.0000 mL | Freq: Once | INTRAVENOUS | Status: DC | PRN

## 2016-04-09 MED ORDER — IOPAMIDOL (ISOVUE-370) INJECTION 76%
60.0000 mL | Freq: Once | INTRAVENOUS | Status: AC | PRN
  Administered 2016-04-09: 75 mL via INTRAVENOUS

## 2016-04-09 MED ORDER — BUPIVACAINE HCL 0.25 % IJ SOLN
30.0000 mL | Freq: Once | INTRAMUSCULAR | Status: AC
  Administered 2016-04-09: 30 mL
  Filled 2016-04-09: qty 30

## 2016-04-09 MED ORDER — CARVEDILOL 6.25 MG PO TABS
12.5000 mg | ORAL_TABLET | Freq: Two times a day (BID) | ORAL | Status: DC
  Administered 2016-04-09: 12.5 mg via ORAL
  Filled 2016-04-09: qty 2

## 2016-04-09 NOTE — Discharge Instructions (Signed)
Return immediately for any worsening shortness of breath, chest pain or fevers.

## 2016-04-09 NOTE — ED Provider Notes (Signed)
[facility] Emergency Department Provider Note    None    (approximate)  I have reviewed the triage vital signs and the nursing notes.   HISTORY  Chief Complaint Shortness of Breath    HPI Lawrence Perkins. is a 76 y.o. male presents with worsening shortness of breath several weeks after hip replacement. Patient is not on any sort of blood thinners. Denies any history of heart failure. No history of heart attack. Does have a history of high blood pressure which has been steadily increasing over the past several days. Patient denies any orthopnea. Denies any chest pain. No discomfort when taking a deep breath. Denies any cough. No fevers. Denies any worsening swelling in his legs. States that his primary complaint is he becomes very short of breath when he ambulates.   Past Medical History:  Diagnosis Date  . Chronic kidney disease   . Complication of anesthesia    has converted to afib during and/or after surgery in the past  . Diverticulosis   . Dysrhythmia    afib  . Gout   . Hypercholesteremia   . Hypertension   . Melanoma ([place])    status post resection from his back  . MGUS (monoclonal gammopathy of unknown significance)   . Prostatism   . Rectal bleeding    Family History  Problem Relation Age of Onset  . Cirrhosis Mother   . Other Father     Lung Fibrosis  . Prostate cancer Neg Hx   . Kidney cancer Neg Hx   . Bladder Cancer Neg Hx    Past Surgical History:  Procedure Laterality Date  . ANKLE SURGERY     following volleyball injury  . APPENDECTOMY    . JOINT REPLACEMENT    . TONSILLECTOMY    . TOTAL HIP ARTHROPLASTY  09/2009   Right femoral neck fracture  . TOTAL HIP ARTHROPLASTY Left 03/06/2016   Procedure: TOTAL HIP ARTHROPLASTY ANTERIOR APPROACH;  Surgeon: Hessie Knows, MD;  Location: [facility] ORS;  Service: Orthopedics;  Laterality: Left;   Patient Active Problem List   Diagnosis Date Noted  . Primary osteoarthritis of left  hip 03/06/2016  . MGUS (monoclonal gammopathy of unknown significance) 10/31/2015  . ATRIAL FIBRILLATION 02/14/2010  . 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 Authorizing Provider  alendronate (FOSAMAX) 70 MG tablet Take 70 mg by mouth every Sunday. Take with a full glass of water on an empty stomach.    Yes Historical Provider, MD  aspirin EC 81 MG tablet Take 81 mg by mouth at bedtime.    Yes Historical Provider, MD  carvedilol (COREG) 12.5 MG tablet Take 12.5 mg by mouth 2 (two) times daily.  01/27/16 01/26/17 Yes Historical Provider, MD  Cholecalciferol (VITAMIN D3) 2000 units TABS Take 5,000 Units by mouth daily.    Yes Historical Provider, MD  Pseudoeph-Doxylamine-DM-APAP (NYQUIL PO) Take 2 capsules by mouth at bedtime as needed (for cold symptoms.).   Yes Historical Provider, MD  tamsulosin (FLOMAX) 0.4 MG CAPS capsule Take 1 capsule (0.4 mg total) by mouth daily. 01/26/16  Yes Shannon A McGowan, PA-C  enoxaparin (LOVENOX) 40 MG/0.4ML injection Inject 0.4 mLs (40 mg total) into the skin daily. Patient not taking: Reported on 04/09/2016 03/08/16 03/22/16  Duanne Guess, PA-C  ferrous [id number] C-folic acid (TRINSICON / FOLTRIN) capsule Take 1 capsule by mouth 3 (three) times daily  after meals. Patient not taking: Reported on 04/09/2016 03/08/16   Duanne Guess, PA-C  oxyCODONE (OXY IR/ROXICODONE) 5 MG immediate release tablet Take 1-2 tablets (5-10 mg total) by mouth every 3 (three) hours as needed for breakthrough pain. Patient not taking: Reported on 04/09/2016 03/08/16   Duanne Guess, PA-C    Allergies Patient has no known allergies.    Social History Social History  Substance Use Topics  . Smoking status: Former Smoker    Years: 15.00    Quit date: 01/01/1981  . Smokeless tobacco: Never Used  . Alcohol use 1.2 oz/week    2 Cans of beer per week    Review of Systems Patient denies  headaches, rhinorrhea, blurry vision, numbness, shortness of breath, chest pain, edema, cough, abdominal pain, nausea, vomiting, diarrhea, dysuria, fevers, rashes or hallucinations unless otherwise stated above in HPI. ____________________________________________   PHYSICAL EXAM:  VITAL SIGNS: Vitals:   04/09/16 2000 04/09/16 2045  BP: (!) 179/90 137/78  Pulse: 68 65  Resp: (!) 26 17  Temp:      Constitutional: Alert and oriented. Well appearing and in no acute distress. Eyes: Conjunctivae are normal. PERRL. EOMI. Head: Atraumatic. Nose: No congestion/rhinnorhea. Mouth/Throat: Mucous membranes are moist.  Oropharynx non-erythematous. Neck: No stridor. Painless ROM. No cervical spine tenderness to palpation Hematological/Lymphatic/Immunilogical: No cervical lymphadenopathy. Cardiovascular: Normal rate, regular rhythm. Grossly normal heart sounds.  Good peripheral circulation. Respiratory: Normal respiratory effort.  No retractions. Lungs with diminished breath sounds in right ll fields, no rhonchi or wheezing Gastrointestinal: Soft and nontender. No distention. No abdominal bruits. No CVA tenderness. Musculoskeletal: No lower extremity tenderness nor edema.  No joint effusions. Neurologic:  Normal speech and language. No gross focal neurologic deficits are appreciated. No gait instability. Skin:  Skin is warm, dry and intact. No rash noted. Psychiatric: Mood and affect are normal. Speech and behavior are normal.  ____________________________________________   LABS (all labs ordered are listed, but only abnormal results are displayed)  Results for orders placed or performed during the hospital encounter of 04/09/16 (from the past 24 hour(s))  Comprehensive metabolic panel     Status: Abnormal   Collection Time: 04/09/16  1:04 PM  Result Value Ref Range   Sodium 137 135 - 145 mmol/L   Potassium 4.7 3.5 - 5.1 mmol/L   Chloride 109 101 - 111 mmol/L   CO2 25 22 - 32 mmol/L    Glucose, Bld 85 65 - 99 mg/dL   BUN 27 (H) 6 - 20 mg/dL   Creatinine, Ser 1.56 (H) 0.61 - 1.24 mg/dL   Calcium 8.5 (L) 8.9 - 10.3 mg/dL   Total Protein 5.9 (L) 6.5 - 8.1 g/dL   Albumin 3.1 (L) 3.5 - 5.0 g/dL   AST 12 (L) 15 - 41 U/L   ALT 8 (L) 17 - 63 U/L   Alkaline Phosphatase 67 38 - 126 U/L   Total Bilirubin 0.5 0.3 - 1.2 mg/dL   GFR calc non Af Amer 41 (L) >60 mL/min   GFR calc Af Amer 48 (L) >60 mL/min   Anion gap 3 (L) 5 - 15  CBC     Status: Abnormal   Collection Time: 04/09/16  1:04 PM  Result Value Ref Range   WBC 5.4 3.8 - 10.6 K/uL   RBC 3.63 (L) 4.40 - 5.90 MIL/uL   Hemoglobin 12.4 (L) 13.0 - 18.0 g/dL   HCT 35.9 (L) 40.0 - 52.0 %   MCV 98.7 80.0 - 100.0 fL  MCH 34.1 (H) 26.0 - 34.0 pg   MCHC 34.6 32.0 - 36.0 g/dL   RDW 14.2 11.5 - 14.5 %   Platelets 253 150 - 440 K/uL  Troponin I     Status: None   Collection Time: 04/09/16  1:04 PM  Result Value Ref Range   Troponin I <0.03 <0.03 ng/mL  Brain natriuretic peptide     Status: Abnormal   Collection Time: 04/09/16  1:04 PM  Result Value Ref Range   B Natriuretic Peptide 374.0 (H) 0.0 - 100.0 pg/mL  Body fluid cell count with differential     Status: Abnormal   Collection Time: 04/09/16  6:10 PM  Result Value Ref Range   Fluid Type-FCT PLEURAL    Color, Fluid YELLOW (A) YELLOW   Appearance, Fluid HAZY (A) CLEAR   WBC, Fluid 290 cu mm   Neutrophil Count, Fluid 0 %   Lymphs, Fluid 94 %   Monocyte-Macrophage-Serous Fluid 6 %   Eos, Fluid 0 %   Other Cells, Fluid 0 %  Glucose, plural or peritoneal fluid     Status: None   Collection Time: 04/09/16  6:10 PM  Result Value Ref Range   Glucose, Fluid 106 mg/dL   Fluid Type-FGLU PLEURAL   Protein, pleural or peritoneal fluid     Status: None   Collection Time: 04/09/16  6:10 PM  Result Value Ref Range   Total protein, fluid <3.0 g/dL   Fluid Type-FTP PLEURAL   Lactate dehydrogenase, Pleural fluid     Status: Abnormal   Collection Time: 04/09/16  6:10 PM    Result Value Ref Range   LD, Fluid 50 (H) 3 - 23 U/L   Fluid Type-FLDH PLEURAL    ____________________________________________  EKG My review and personal interpretation at Time: 13:02   Indication: sob  Rate: 65  Rhythm: sinus Axis: normal Other: sinus dysrhythmia, normal intervals, no st elevations or depressions ____________________________________________  RADIOLOGY  I personally reviewed all radiographic images ordered to evaluate for the above acute complaints and reviewed radiology reports and findings.  These findings were personally discussed with the patient.  Please see medical record for radiology report.  ____________________________________________   PROCEDURES  Procedure(s) performed:  THORACENTESIS BEDSIDE Date/Time: 04/09/2016 7:03 PM Performed by: Merlyn Lot Authorized by: Merlyn Lot   Consent:    Consent obtained:  Written   Consent given by:  Patient   Risks discussed:  Infection, bleeding, pain and pneumothorax Procedure details:    Patient position:  Sitting   Location:  R midscapular line   Intercostal space:  7th   Puncture method:  Over-the-needle catheter   Ultrasound guidance: yes     Indwelling catheter placed: no     Needle gauge:  14   Catheter size:  8 Fr   Number of attempts:  1   Drainage characteristics:  Serosanguinous Post-procedure details:    Chest x-ray performed: yes     Chest x-ray findings:  Pleural effusion improved   Patient tolerance of procedure:  Tolerated well, no immediate complications      Critical Care performed: no ____________________________________________   INITIAL IMPRESSION / ASSESSMENT AND PLAN / ED COURSE  Pertinent labs & imaging results that were available during my care of the patient were reviewed by me and considered in my medical decision making (see chart for details).  DDX: Asthma, copd, CHF, pna, ptx, malignancy, Pe, anemia   Lawrence Daigler. is a 76 y.o. who presents  to the  ED with worsening shortness of breath as described above. EKG shows no acute ischemia and his troponin is not elevated. Does have mildly elevated BNP the patient denies any history of heart failure or orthopnea. This would be a new diagnosis for him. Patient is at increased risk of pulmonary embolism given his recent operation and hip replacement. No fevers to suggest acute infectious process. To feel patient will require CT imaging to further characterize the new right pleural effusion demonstrated on his chest x-ray. Concern that this could be pulmonary infarct versus loculated effusion.  The patient will be placed on continuous pulse oximetry and telemetry for monitoring.  Laboratory evaluation will be sent to evaluate for the above complaints.     Clinical Course as of Apr 09 2300  Mon Apr 09, 2016  1727 CT with evidence of right-sided pleural effusion. This is a new diagnosis. Likely subacute is a patient is otherwise hemodynamically stable. Could be component of heart failure and his hypertension but there is no interstitial edema. We'll perform diagnostic and therapeutic thoracentesis to better characterize.  [PR]  1956 PAtient states he feels "much better."  Able to ambulate without any hypoxia.    [PR]  2014 Patient with normal glucose.  LDH is mildly elevated. Mild lymphocytic predominance on cell differential. Patient again requesting discharge home. He is otherwise well-appearing and in no acute distress. As he is afebrile and has no leukocytosis without any hypoxia do not feel that antibiotics are clinically indicated at this time. The patient will be arranged for close follow-up with heart failure clinic as well as pulmonology. His blood pressure is decreasing after thoracentesis. States that he feels much better.  Patient was able to tolerate PO and was able to ambulate with a steady gait.  Have discussed with the patient and available family all diagnostics and treatments performed thus  far and all questions were answered to the best of my ability. The patient demonstrates understanding and agreement with plan.      Clinical Course User Index [PR] Merlyn Lot, MD     ____________________________________________   FINAL CLINICAL IMPRESSION(S) / ED DIAGNOSES  Final diagnoses:  Shortness of breath  Pleural effusion      NEW MEDICATIONS STARTED DURING THIS VISIT:  Discharge Medication List as of 04/09/2016  8:23 PM       Note:  This document was prepared using Dragon voice recognition software and may include unintentional dictation errors.    Merlyn Lot, MD 04/09/16 [phone]

## 2016-04-09 NOTE — ED Triage Notes (Signed)
Pt to ED c/o SOB x1 week worse with exertion.  States had left hip replacement March 6th by Dr. Rudene Christians.  Denies pain, chest rise even and unlabored, skin warm and dry, A&Ox4.

## 2016-04-09 NOTE — ED Notes (Signed)
Patient maintained an O2 saturation of 100% while ambulating

## 2016-04-10 ENCOUNTER — Ambulatory Visit: Payer: PPO | Attending: Family | Admitting: Family

## 2016-04-10 ENCOUNTER — Telehealth: Payer: Self-pay | Admitting: Internal Medicine

## 2016-04-10 ENCOUNTER — Encounter: Payer: Self-pay | Admitting: Family

## 2016-04-10 VITALS — BP 159/72 | HR 56 | Resp 18 | Ht 72.0 in | Wt 198.1 lb

## 2016-04-10 DIAGNOSIS — E7800 Pure hypercholesterolemia, unspecified: Secondary | ICD-10-CM | POA: Diagnosis not present

## 2016-04-10 DIAGNOSIS — Z9889 Other specified postprocedural states: Secondary | ICD-10-CM | POA: Insufficient documentation

## 2016-04-10 DIAGNOSIS — J90 Pleural effusion, not elsewhere classified: Secondary | ICD-10-CM

## 2016-04-10 DIAGNOSIS — Z85820 Personal history of malignant melanoma of skin: Secondary | ICD-10-CM | POA: Insufficient documentation

## 2016-04-10 DIAGNOSIS — I5032 Chronic diastolic (congestive) heart failure: Secondary | ICD-10-CM | POA: Diagnosis not present

## 2016-04-10 DIAGNOSIS — Z96643 Presence of artificial hip joint, bilateral: Secondary | ICD-10-CM | POA: Diagnosis not present

## 2016-04-10 DIAGNOSIS — Z87891 Personal history of nicotine dependence: Secondary | ICD-10-CM | POA: Insufficient documentation

## 2016-04-10 DIAGNOSIS — Z8489 Family history of other specified conditions: Secondary | ICD-10-CM | POA: Diagnosis not present

## 2016-04-10 DIAGNOSIS — N400 Enlarged prostate without lower urinary tract symptoms: Secondary | ICD-10-CM | POA: Diagnosis not present

## 2016-04-10 DIAGNOSIS — Z7982 Long term (current) use of aspirin: Secondary | ICD-10-CM | POA: Diagnosis not present

## 2016-04-10 DIAGNOSIS — M109 Gout, unspecified: Secondary | ICD-10-CM | POA: Diagnosis not present

## 2016-04-10 DIAGNOSIS — I4891 Unspecified atrial fibrillation: Secondary | ICD-10-CM | POA: Diagnosis not present

## 2016-04-10 DIAGNOSIS — Z836 Family history of other diseases of the respiratory system: Secondary | ICD-10-CM | POA: Insufficient documentation

## 2016-04-10 DIAGNOSIS — N189 Chronic kidney disease, unspecified: Secondary | ICD-10-CM | POA: Diagnosis not present

## 2016-04-10 DIAGNOSIS — I130 Hypertensive heart and chronic kidney disease with heart failure and stage 1 through stage 4 chronic kidney disease, or unspecified chronic kidney disease: Secondary | ICD-10-CM | POA: Diagnosis not present

## 2016-04-10 DIAGNOSIS — I10 Essential (primary) hypertension: Secondary | ICD-10-CM

## 2016-04-10 DIAGNOSIS — M1612 Unilateral primary osteoarthritis, left hip: Secondary | ICD-10-CM | POA: Insufficient documentation

## 2016-04-10 DIAGNOSIS — I480 Paroxysmal atrial fibrillation: Secondary | ICD-10-CM

## 2016-04-10 LAB — PH, BODY FLUID: pH, Body Fluid: 7.9

## 2016-04-10 MED ORDER — FUROSEMIDE 20 MG PO TABS: 20.0000 mg | ORAL_TABLET | Freq: Every day | ORAL | 3 refills | Status: DC

## 2016-04-10 NOTE — Telephone Encounter (Signed)
Pt wife stating pt was in ED last night and was told to call and make a follow up appointment with [place]. Pt was needing a soon appointment with a provider  Please advise

## 2016-04-10 NOTE — Telephone Encounter (Signed)
Per DS schedule pt for 05/08/16 with [place] at 9:30am and pt needs to go 1 hour prior to appt [facility]. Order placed. Please advise pt.

## 2016-04-10 NOTE — Progress Notes (Signed)
Patient ID: Lawrence Perkins., male    DOB: 08/25/1940, 76 y.o.   MRN: [id number]  HPI  Mr Kinker is a 76 y/o male with a history of melanoma, HTN, hyperlipidemia, gout, atrial fibrillation, diverticulosis, CKD, remote tobacco use and heart failure.   Last EF was 52% via a stress test 02/21/16. Has not had an echocardiogram done.  Was in the ED 04/09/16 with shortness of breath over the last several weeks. BNP mildly elevated. Chest CT showed new right-sided pleural effusion. Had a thoracentesis done with improvement of his symptoms. Discharged home. Admitted 03/06/16 for a left hip replacement. Discharged home in 2 days with home health physical therapy.   He presents today for his initial visit with fatigue with moderate exertion. Denies any shortness of breath at all. Does have some swelling around his left ankle. Has been weighing himself and says that his weight has been stable. Not adding any salt to his food and his wife has begun reading food labels.   Past Medical History:  Diagnosis Date  . Chronic kidney disease   . Complication of anesthesia    has converted to afib during and/or after surgery in the past  . Diverticulosis   . Dysrhythmia    afib  . Gout   . Hypercholesteremia   . Hypertension   . Melanoma ([place])    status post resection from his back  . MGUS (monoclonal gammopathy of unknown significance)   . Prostatism   . Rectal bleeding    Past Surgical History:  Procedure Laterality Date  . ANKLE SURGERY     following volleyball injury  . APPENDECTOMY    . JOINT REPLACEMENT    . TONSILLECTOMY    . TOTAL HIP ARTHROPLASTY  09/2009   Right femoral neck fracture  . TOTAL HIP ARTHROPLASTY Left 03/06/2016   Procedure: TOTAL HIP ARTHROPLASTY ANTERIOR APPROACH;  Surgeon: Hessie Knows, MD;  Location: [facility] ORS;  Service: Orthopedics;  Laterality: Left;   Family History  Problem Relation Age of Onset  . Cirrhosis Mother   . Other Father     Lung Fibrosis  . Prostate  cancer Neg Hx   . Kidney cancer Neg Hx   . Bladder Cancer Neg Hx    Social History  Substance Use Topics  . Smoking status: Former Smoker    Years: 15.00    Quit date: 01/01/1981  . Smokeless tobacco: Never Used  . Alcohol use 1.2 oz/week    2 Cans of beer per week   No Known Allergies Prior to Admission medications   Medication Sig Start Date End Date Taking? Authorizing Provider  alendronate (FOSAMAX) 70 MG tablet Take 70 mg by mouth every Sunday. Take with a full glass of water on an empty stomach.    Yes Historical Provider, MD  aspirin EC 81 MG tablet Take 81 mg by mouth at bedtime.    Yes Historical Provider, MD  carvedilol (COREG) 12.5 MG tablet Take 12.5 mg by mouth 2 (two) times daily.  01/27/16 01/26/17 Yes Historical Provider, MD  Cholecalciferol (VITAMIN D3) 2000 units TABS Take 5,000 Units by mouth daily.    Yes Historical Provider, MD  tamsulosin (FLOMAX) 0.4 MG CAPS capsule Take 1 capsule (0.4 mg total) by mouth daily. 01/26/16  Yes Shannon A McGowan, PA-C  furosemide (LASIX) 20 MG tablet Take 1 tablet (20 mg total) by mouth daily. 04/10/16 05/10/16  Alisa Graff, FNP  Pseudoeph-Doxylamine-DM-APAP (NYQUIL PO) Take 2 capsules by mouth at  bedtime as needed (for cold symptoms.).    Historical Provider, MD     Review of Systems  Constitutional: Positive for appetite change and fatigue.  HENT: Negative for congestion, postnasal drip and sore throat.   Eyes: Negative.   Respiratory: Negative for chest tightness and shortness of breath.   Cardiovascular: Positive for leg swelling (left ankle). Negative for chest pain and palpitations.  Gastrointestinal: Negative for abdominal distention and abdominal pain.  Endocrine: Negative.   Genitourinary: Negative.   Musculoskeletal: Positive for arthralgias (left hip). Negative for back pain.  Skin: Negative.   Allergic/Immunologic: Negative.   Neurological: Negative for dizziness and light-headedness.  Hematological: Negative for  adenopathy. Does not bruise/bleed easily.  Psychiatric/Behavioral: Negative for dysphoric mood, sleep disturbance (sleeping on 1 pillow) and suicidal ideas. The patient is not nervous/anxious.    Vitals:   04/10/16 1129  BP: (!) 159/72  Pulse: (!) 56  Resp: 18  SpO2: 99%  Weight: 198 lb 2 oz (89.9 kg)  Height: 6' (1.829 m)   Wt Readings from Last 3 Encounters:  04/10/16 198 lb 2 oz (89.9 kg)  04/09/16 205 lb (93 kg)  03/06/16 207 lb (93.9 kg)   Lab Results  Component Value Date   CREATININE 1.56 (H) 04/09/2016   CREATININE 1.66 (H) 03/08/2016   CREATININE 1.72 (H) 03/07/2016    Physical Exam  Constitutional: He is oriented to person, place, and time. He appears well-developed and well-nourished.  HENT:  Head: Normocephalic and atraumatic.  Eyes: Conjunctivae are normal. Pupils are equal, round, and reactive to light.  Neck: Normal range of motion. Neck supple. No JVD present.  Cardiovascular: Regular rhythm.  Bradycardia present.   Pulmonary/Chest: Effort normal. He has no wheezes. He has no rales.  Abdominal: Soft. He exhibits no distension. There is no tenderness.  Musculoskeletal: He exhibits edema (1+ pitting edema in left lower leg and trace amount right lower leg). He exhibits no tenderness.  Neurological: He is alert and oriented to person, place, and time.  Skin: Skin is warm and dry.  Psychiatric: He has a normal mood and affect. His behavior is normal. Thought content normal.  Nursing note and vitals reviewed.  Assessment & Plan:  1: Chronic heart failure with preserved ejection fraction- - NYHA class II - mild pedal edema - hasn't been weighing daily. Instructed to weigh daily first thing in the morning after using the bathroom and write the weight down. Call for an overnight weight gain of >2 pounds or a weekly weight gain of >5 pounds - not adding salt. Reviewed how to read food labels so that he can follow a 2000mg  sodium diet. Written dietary information  given to him.  - will add furosemide 20mg  daily for pedal edema. Potassium 4.7 on 04/09/16 so will not give any potassium - check a BMP next week - appointment made with cardiologist Nehemiah Massed) on 04/18/16  2: HTN- - BP looks good - saw PCP Ouida Sills) 02/17/16  3: Atrial fibrillation- - currently rate controlled with slight bradycardia - taking carvedilol  4: Osteoarthritis of left hip- - had his left hip replaced 5 weeks ago - finished physical therapy and is walking on his own with his cane  Medication list was reviewed.  Return in 1 week or sooner for any questions/problems before then.

## 2016-04-10 NOTE — Patient Instructions (Addendum)
Begin weighing daily and call for an overnight weight gain of > 2 pounds or a weekly weight gain of >5 pounds.  Follow up with Serafina Royals on:  April 18, 2016 at 9:15 AM

## 2016-04-11 DIAGNOSIS — N183 Chronic kidney disease, stage 3 unspecified: Secondary | ICD-10-CM | POA: Insufficient documentation

## 2016-04-11 DIAGNOSIS — R0609 Other forms of dyspnea: Secondary | ICD-10-CM | POA: Diagnosis not present

## 2016-04-11 DIAGNOSIS — I5032 Chronic diastolic (congestive) heart failure: Secondary | ICD-10-CM | POA: Insufficient documentation

## 2016-04-11 DIAGNOSIS — I129 Hypertensive chronic kidney disease with stage 1 through stage 4 chronic kidney disease, or unspecified chronic kidney disease: Secondary | ICD-10-CM | POA: Insufficient documentation

## 2016-04-11 DIAGNOSIS — J90 Pleural effusion, not elsewhere classified: Secondary | ICD-10-CM | POA: Diagnosis not present

## 2016-04-11 DIAGNOSIS — I5022 Chronic systolic (congestive) heart failure: Secondary | ICD-10-CM | POA: Insufficient documentation

## 2016-04-11 LAB — MISC LABCORP TEST (SEND OUT): Labcorp test code: 19588

## 2016-04-11 LAB — CYTOLOGY - NON PAP

## 2016-04-12 NOTE — Telephone Encounter (Signed)
Lmov for patient to call back and confirm apt time

## 2016-04-13 LAB — BODY FLUID CULTURE: Culture: NO GROWTH

## 2016-04-13 NOTE — Telephone Encounter (Signed)
Pt called back and confirmed apt.

## 2016-04-18 DIAGNOSIS — J90 Pleural effusion, not elsewhere classified: Secondary | ICD-10-CM | POA: Diagnosis not present

## 2016-04-18 DIAGNOSIS — I480 Paroxysmal atrial fibrillation: Secondary | ICD-10-CM | POA: Diagnosis not present

## 2016-04-18 DIAGNOSIS — Z96642 Presence of left artificial hip joint: Secondary | ICD-10-CM | POA: Diagnosis not present

## 2016-04-18 DIAGNOSIS — I129 Hypertensive chronic kidney disease with stage 1 through stage 4 chronic kidney disease, or unspecified chronic kidney disease: Secondary | ICD-10-CM | POA: Diagnosis not present

## 2016-04-18 DIAGNOSIS — N183 Chronic kidney disease, stage 3 (moderate): Secondary | ICD-10-CM | POA: Diagnosis not present

## 2016-04-18 DIAGNOSIS — I5021 Acute systolic (congestive) heart failure: Secondary | ICD-10-CM | POA: Diagnosis not present

## 2016-04-20 ENCOUNTER — Telehealth: Payer: Self-pay | Admitting: Family

## 2016-04-20 ENCOUNTER — Ambulatory Visit: Payer: PPO | Admitting: Family

## 2016-04-20 NOTE — Telephone Encounter (Signed)
Patient called to cancel his appointment today and advised [place] that his cardiologist had told him to not come at this time. Baker Janus (cardiologist's nurse) said that patient is currently wearing a holter and is getting an echocardiogram done next week. Advised her that patient was supposed to get a BMP drawn today as I had started furosemide at his last visit and she says that they will get it drawn when patient returns.

## 2016-04-23 DIAGNOSIS — I480 Paroxysmal atrial fibrillation: Secondary | ICD-10-CM | POA: Diagnosis not present

## 2016-04-23 DIAGNOSIS — R0609 Other forms of dyspnea: Secondary | ICD-10-CM | POA: Diagnosis not present

## 2016-04-23 DIAGNOSIS — J90 Pleural effusion, not elsewhere classified: Secondary | ICD-10-CM | POA: Diagnosis not present

## 2016-04-23 DIAGNOSIS — I5022 Chronic systolic (congestive) heart failure: Secondary | ICD-10-CM | POA: Insufficient documentation

## 2016-04-25 DIAGNOSIS — R0602 Shortness of breath: Secondary | ICD-10-CM | POA: Diagnosis not present

## 2016-04-25 DIAGNOSIS — I4891 Unspecified atrial fibrillation: Secondary | ICD-10-CM | POA: Diagnosis not present

## 2016-04-25 DIAGNOSIS — Z8709 Personal history of other diseases of the respiratory system: Secondary | ICD-10-CM | POA: Diagnosis not present

## 2016-04-25 DIAGNOSIS — I5022 Chronic systolic (congestive) heart failure: Secondary | ICD-10-CM | POA: Diagnosis not present

## 2016-04-25 DIAGNOSIS — J90 Pleural effusion, not elsewhere classified: Secondary | ICD-10-CM | POA: Diagnosis not present

## 2016-05-07 DIAGNOSIS — R0609 Other forms of dyspnea: Secondary | ICD-10-CM | POA: Diagnosis not present

## 2016-05-07 DIAGNOSIS — J90 Pleural effusion, not elsewhere classified: Secondary | ICD-10-CM | POA: Diagnosis not present

## 2016-05-07 DIAGNOSIS — I480 Paroxysmal atrial fibrillation: Secondary | ICD-10-CM | POA: Diagnosis not present

## 2016-05-07 DIAGNOSIS — I129 Hypertensive chronic kidney disease with stage 1 through stage 4 chronic kidney disease, or unspecified chronic kidney disease: Secondary | ICD-10-CM | POA: Diagnosis not present

## 2016-05-07 DIAGNOSIS — N183 Chronic kidney disease, stage 3 (moderate): Secondary | ICD-10-CM | POA: Diagnosis not present

## 2016-05-07 DIAGNOSIS — E7800 Pure hypercholesterolemia, unspecified: Secondary | ICD-10-CM | POA: Diagnosis not present

## 2016-05-08 ENCOUNTER — Ambulatory Visit: Payer: PPO | Admitting: Pulmonary Disease

## 2016-05-14 DIAGNOSIS — N183 Chronic kidney disease, stage 3 (moderate): Secondary | ICD-10-CM | POA: Diagnosis not present

## 2016-05-14 DIAGNOSIS — J90 Pleural effusion, not elsewhere classified: Secondary | ICD-10-CM | POA: Diagnosis not present

## 2016-05-14 DIAGNOSIS — E7800 Pure hypercholesterolemia, unspecified: Secondary | ICD-10-CM | POA: Diagnosis not present

## 2016-05-14 DIAGNOSIS — I129 Hypertensive chronic kidney disease with stage 1 through stage 4 chronic kidney disease, or unspecified chronic kidney disease: Secondary | ICD-10-CM | POA: Diagnosis not present

## 2016-05-14 DIAGNOSIS — I5022 Chronic systolic (congestive) heart failure: Secondary | ICD-10-CM | POA: Diagnosis not present

## 2016-05-14 DIAGNOSIS — I4891 Unspecified atrial fibrillation: Secondary | ICD-10-CM | POA: Diagnosis not present

## 2016-05-21 ENCOUNTER — Institutional Professional Consult (permissible substitution): Payer: PPO | Admitting: Internal Medicine

## 2016-05-23 DIAGNOSIS — N183 Chronic kidney disease, stage 3 (moderate): Secondary | ICD-10-CM | POA: Diagnosis not present

## 2016-05-23 DIAGNOSIS — I4891 Unspecified atrial fibrillation: Secondary | ICD-10-CM | POA: Diagnosis not present

## 2016-05-23 DIAGNOSIS — R0602 Shortness of breath: Secondary | ICD-10-CM | POA: Diagnosis not present

## 2016-05-23 DIAGNOSIS — I5022 Chronic systolic (congestive) heart failure: Secondary | ICD-10-CM | POA: Diagnosis not present

## 2016-05-23 DIAGNOSIS — I129 Hypertensive chronic kidney disease with stage 1 through stage 4 chronic kidney disease, or unspecified chronic kidney disease: Secondary | ICD-10-CM | POA: Diagnosis not present

## 2016-06-06 DIAGNOSIS — N183 Chronic kidney disease, stage 3 (moderate): Secondary | ICD-10-CM | POA: Diagnosis not present

## 2016-06-06 DIAGNOSIS — I129 Hypertensive chronic kidney disease with stage 1 through stage 4 chronic kidney disease, or unspecified chronic kidney disease: Secondary | ICD-10-CM | POA: Diagnosis not present

## 2016-06-06 DIAGNOSIS — R0602 Shortness of breath: Secondary | ICD-10-CM | POA: Diagnosis not present

## 2016-06-06 DIAGNOSIS — E7800 Pure hypercholesterolemia, unspecified: Secondary | ICD-10-CM | POA: Diagnosis not present

## 2016-06-06 DIAGNOSIS — I4891 Unspecified atrial fibrillation: Secondary | ICD-10-CM | POA: Diagnosis not present

## 2016-06-06 DIAGNOSIS — I5022 Chronic systolic (congestive) heart failure: Secondary | ICD-10-CM | POA: Diagnosis not present

## 2016-06-20 DIAGNOSIS — N183 Chronic kidney disease, stage 3 (moderate): Secondary | ICD-10-CM | POA: Diagnosis not present

## 2016-06-20 DIAGNOSIS — R001 Bradycardia, unspecified: Secondary | ICD-10-CM | POA: Insufficient documentation

## 2016-06-20 DIAGNOSIS — I480 Paroxysmal atrial fibrillation: Secondary | ICD-10-CM | POA: Diagnosis not present

## 2016-06-20 DIAGNOSIS — I129 Hypertensive chronic kidney disease with stage 1 through stage 4 chronic kidney disease, or unspecified chronic kidney disease: Secondary | ICD-10-CM | POA: Diagnosis not present

## 2016-08-06 ENCOUNTER — Inpatient Hospital Stay: Payer: PPO | Attending: Oncology

## 2016-08-06 ENCOUNTER — Other Ambulatory Visit: Payer: Self-pay

## 2016-08-06 DIAGNOSIS — N189 Chronic kidney disease, unspecified: Secondary | ICD-10-CM | POA: Diagnosis not present

## 2016-08-06 DIAGNOSIS — Z79899 Other long term (current) drug therapy: Secondary | ICD-10-CM | POA: Diagnosis not present

## 2016-08-06 DIAGNOSIS — I509 Heart failure, unspecified: Secondary | ICD-10-CM | POA: Insufficient documentation

## 2016-08-06 DIAGNOSIS — M109 Gout, unspecified: Secondary | ICD-10-CM | POA: Diagnosis not present

## 2016-08-06 DIAGNOSIS — Z87891 Personal history of nicotine dependence: Secondary | ICD-10-CM | POA: Diagnosis not present

## 2016-08-06 DIAGNOSIS — E7800 Pure hypercholesterolemia, unspecified: Secondary | ICD-10-CM | POA: Diagnosis not present

## 2016-08-06 DIAGNOSIS — D472 Monoclonal gammopathy: Secondary | ICD-10-CM

## 2016-08-06 DIAGNOSIS — Z85820 Personal history of malignant melanoma of skin: Secondary | ICD-10-CM | POA: Diagnosis not present

## 2016-08-06 DIAGNOSIS — I129 Hypertensive chronic kidney disease with stage 1 through stage 4 chronic kidney disease, or unspecified chronic kidney disease: Secondary | ICD-10-CM | POA: Insufficient documentation

## 2016-08-06 DIAGNOSIS — N400 Enlarged prostate without lower urinary tract symptoms: Secondary | ICD-10-CM | POA: Insufficient documentation

## 2016-08-06 DIAGNOSIS — I4891 Unspecified atrial fibrillation: Secondary | ICD-10-CM | POA: Insufficient documentation

## 2016-08-06 DIAGNOSIS — J90 Pleural effusion, not elsewhere classified: Secondary | ICD-10-CM | POA: Diagnosis not present

## 2016-08-06 LAB — CBC WITH DIFFERENTIAL/PLATELET
BASOS ABS: 0 10*3/uL (ref 0–0.1)
BASOS PCT: 0 %
EOS ABS: 0.1 10*3/uL (ref 0–0.7)
EOS PCT: 4 %
HCT: 36.3 % — ABNORMAL LOW (ref 40.0–52.0)
Hemoglobin: 12.7 g/dL — ABNORMAL LOW (ref 13.0–18.0)
LYMPHS PCT: 16 %
Lymphs Abs: 0.6 10*3/uL — ABNORMAL LOW (ref 1.0–3.6)
MCH: 33.2 pg (ref 26.0–34.0)
MCHC: 34.9 g/dL (ref 32.0–36.0)
MCV: 95.2 fL (ref 80.0–100.0)
MONO ABS: 0.3 10*3/uL (ref 0.2–1.0)
Monocytes Relative: 6 %
Neutro Abs: 3 10*3/uL (ref 1.4–6.5)
Neutrophils Relative %: 74 %
Platelets: 211 10*3/uL (ref 150–440)
RBC: 3.81 MIL/uL — AB (ref 4.40–5.90)
RDW: 14.8 % — ABNORMAL HIGH (ref 11.5–14.5)
WBC: 4 10*3/uL (ref 3.8–10.6)

## 2016-08-06 LAB — BASIC METABOLIC PANEL
Anion gap: 7 (ref 5–15)
BUN: 43 mg/dL — AB (ref 6–20)
CALCIUM: 8.6 mg/dL — AB (ref 8.9–10.3)
CO2: 24 mmol/L (ref 22–32)
CREATININE: 2.03 mg/dL — AB (ref 0.61–1.24)
Chloride: 106 mmol/L (ref 101–111)
GFR calc Af Amer: 35 mL/min — ABNORMAL LOW (ref >60)
GFR, EST NON AFRICAN AMERICAN: 30 mL/min — AB (ref >60)
Glucose, Bld: 95 mg/dL (ref 65–99)
POTASSIUM: 4.7 mmol/L (ref 3.5–5.1)
SODIUM: 137 mmol/L (ref 135–145)

## 2016-08-07 LAB — PROTEIN ELECTROPHORESIS, SERUM
A/G RATIO SPE: 1.2 (ref 0.7–1.7)
ALBUMIN ELP: 3.2 g/dL (ref 2.9–4.4)
ALPHA-1-GLOBULIN: 0.2 g/dL (ref 0.0–0.4)
ALPHA-2-GLOBULIN: 0.5 g/dL (ref 0.4–1.0)
BETA GLOBULIN: 0.9 g/dL (ref 0.7–1.3)
GAMMA GLOBULIN: 1.1 g/dL (ref 0.4–1.8)
Globulin, Total: 2.7 g/dL (ref 2.2–3.9)
M-Spike, %: 0.4 g/dL — ABNORMAL HIGH
Total Protein ELP: 5.9 g/dL — ABNORMAL LOW (ref 6.0–8.5)

## 2016-08-07 LAB — IGG, IGA, IGM
IGG (IMMUNOGLOBIN G), SERUM: 1011 mg/dL (ref 700–1600)
IGM, SERUM: 53 mg/dL (ref 15–143)
IgA: 244 mg/dL (ref 61–437)

## 2016-08-07 LAB — KAPPA/LAMBDA LIGHT CHAINS
KAPPA, LAMDA LIGHT CHAIN RATIO: 0.85 (ref 0.26–1.65)
Kappa free light chain: 89 mg/L — ABNORMAL HIGH (ref 3.3–19.4)
Lambda free light chains: 104.6 mg/L — ABNORMAL HIGH (ref 5.7–26.3)

## 2016-08-09 DIAGNOSIS — N183 Chronic kidney disease, stage 3 (moderate): Secondary | ICD-10-CM | POA: Diagnosis not present

## 2016-08-09 DIAGNOSIS — I129 Hypertensive chronic kidney disease with stage 1 through stage 4 chronic kidney disease, or unspecified chronic kidney disease: Secondary | ICD-10-CM | POA: Diagnosis not present

## 2016-08-12 NOTE — Progress Notes (Signed)
[place]  Telephone:([phone] [phone]  ID: Lawrence Perkins. OB: 25-Feb-1940  MR#: [id number]  WNI#:[id number]  Patient Care Team: Kirk Ruths, MD as PCP - General (Internal Medicine)  CHIEF COMPLAINT: MGUS.  INTERVAL HISTORY: Patient returns to clinic today for repeat laboratory work and further evaluation. He currently feels well and is asymptomatic. He has no neurologic complaints. He recently had a hip replacement. He also had a significant pleural effusion secondary to CHF. He denies any pain. He has a good appetite and denies weight loss. He has no neurologic complaints. He denies any night sweats. He has no chest pain or shortness of breath. He denies any nausea, vomiting, constipation, or diarrhea. He has no urinary complaints. Patient offers no specific complaints today.  REVIEW OF SYSTEMS:   Review of Systems  Constitutional: Negative.  Negative for fever, malaise/fatigue and weight loss.  Respiratory: Negative.  Negative for cough and shortness of breath.   Cardiovascular: Negative.  Negative for chest pain and leg swelling.  Gastrointestinal: Negative.  Negative for abdominal pain.  Genitourinary: Negative.   Musculoskeletal: Negative.  Negative for back pain and falls.  Neurological: Negative.  Negative for weakness.  Endo/Heme/Allergies: Does not bruise/bleed easily.  Psychiatric/Behavioral: Negative.  The patient is not nervous/anxious.     As per HPI. Otherwise, a complete review of systems is negative.  PAST MEDICAL HISTORY: Past Medical History:  Diagnosis Date  . Chronic kidney disease   . Complication of anesthesia    has converted to afib during and/or after surgery in the past  . Diverticulosis   . Dysrhythmia    afib  . Gout   . Hypercholesteremia   . Hypertension   . 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 MGUS (monoclonal gammopathy of unknown significance)   . Prostatism   . Rectal  bleeding     PAST SURGICAL HISTORY: Past Surgical History:  Procedure Laterality Date  . ANKLE SURGERY     following volleyball injury  . APPENDECTOMY    . JOINT REPLACEMENT    . TONSILLECTOMY    . TOTAL HIP ARTHROPLASTY  09/2009   Right femoral neck fracture  . TOTAL HIP ARTHROPLASTY Left 03/06/2016   Procedure: TOTAL HIP ARTHROPLASTY ANTERIOR APPROACH;  Surgeon: Hessie Knows, MD;  Location: [facility] ORS;  Service: Orthopedics;  Laterality: Left;    FAMILY HISTORY: Family History  Problem Relation Age of Onset  . Cirrhosis Mother   . Other Father        Lung Fibrosis  . Prostate cancer Neg Hx   . Kidney cancer Neg Hx   . Bladder Cancer Neg Hx     ADVANCED DIRECTIVES (Y/N):  N  HEALTH MAINTENANCE: Social History  Substance Use Topics  . Smoking status: Former Smoker    Years: 15.00    Quit date: 01/01/1981  . Smokeless tobacco: Never Used  . Alcohol use 1.2 oz/week    2 Cans of beer per week     Colonoscopy:  PAP:  Bone density:  Lipid panel:  No Known Allergies  Current Outpatient Prescriptions  Medication Sig Dispense Refill  . alendronate (FOSAMAX) 70 MG tablet Take 70 mg by mouth every Sunday. Take with a full glass of water on an empty stomach.     [name] amLODipine (NORVASC) 5 MG tablet Take 5 mg by mouth daily.    [name] apixaban (ELIQUIS) 5 MG TABS tablet Take 5 mg by mouth 2 (two)  times daily.    . carvedilol (COREG) 12.5 MG tablet Take 12.5 mg by mouth 2 (two) times daily.     . Cholecalciferol (VITAMIN D3) 2000 units TABS Take 5,000 Units by mouth daily.     . dapagliflozin propanediol (FARXIGA) 10 MG TABS tablet Take 10 mg by mouth daily. Patient on trial    . tamsulosin (FLOMAX) 0.4 MG CAPS capsule Take 1 capsule (0.4 mg total) by mouth daily. 90 capsule 3  . furosemide (LASIX) 20 MG tablet Take 1 tablet (20 mg total) by mouth daily. 30 tablet 3   No current facility-administered medications for this visit.     OBJECTIVE: Vitals:   08/13/16 1018  BP: (!)  165/72  Pulse: (!) 48  Resp: 20  Temp: 97.8 F (36.6 C)     Body mass index is 26.13 kg/m.    ECOG FS:0 - Asymptomatic  General: Well-developed, well-nourished, no acute distress. Eyes: Pink conjunctiva, anicteric sclera. Lungs: Clear to auscultation bilaterally. Heart: Regular rate and rhythm. No rubs, murmurs, or gallops. Abdomen: Soft, nontender, nondistended. No organomegaly noted, normoactive bowel sounds. Musculoskeletal: No edema, cyanosis, or clubbing. Neuro: Alert, answering all questions appropriately. Cranial nerves grossly intact. Skin: No rashes or petechiae noted. Psych: Normal affect.   LAB RESULTS:  Lab Results  Component Value Date   NA 137 08/06/2016   K 4.7 08/06/2016   CL 106 08/06/2016   CO2 24 08/06/2016   GLUCOSE 95 08/06/2016   BUN 43 (H) 08/06/2016   CREATININE 2.03 (H) 08/06/2016   CALCIUM 8.6 (L) 08/06/2016   PROT 5.9 (L) 04/09/2016   ALBUMIN 3.1 (L) 04/09/2016   AST 12 (L) 04/09/2016   ALT 8 (L) 04/09/2016   ALKPHOS 67 04/09/2016   BILITOT 0.5 04/09/2016   GFRNONAA 30 (L) 08/06/2016   GFRAA 35 (L) 08/06/2016    Lab Results  Component Value Date   WBC 4.0 08/06/2016   NEUTROABS 3.0 08/06/2016   HGB 12.7 (L) 08/06/2016   HCT 36.3 (L) 08/06/2016   MCV 95.2 08/06/2016   PLT 211 08/06/2016   Lab Results  Component Value Date   TOTALPROTELP 5.9 (L) 08/06/2016   ALBUMINELP 3.2 08/06/2016   A1GS 0.2 08/06/2016   A2GS 0.5 08/06/2016   BETS 0.9 08/06/2016   GAMS 1.1 08/06/2016   MSPIKE 0.4 (H) 08/06/2016   SPEI Comment 08/06/2016     STUDIES: No results found.  ASSESSMENT: MGUS  PLAN:    1. MGUS: Patient had a bone marrow biopsy on November 03, 2015 that revealed only a mild increased plasma cell population of approximately 5-10%. Patient was also noted to have cytogenetic abnormality of a monosomy 67 which is an average risk of multiple myeloma. His recent M spike is essentially unchanged at 0.4. He has elevation of both his  kappa and lambda free light chains. Given his bone marrow results, patient is possibly at higher risk for progressing to multiple myeloma. Will consider a metastatic bone survey in the future if there is suspicion of progression of disease. Return to clinic in 6 months with repeat laboratory work and further evaluation. 2. Renal insufficiency: Patient's creatinine continues to slowly trend up. A referral was made to nephrology.  3. Pleural effusion: Patient reports he had a thoracentesis and is now currently taking Lasix. Continue treatment and monitoring per primary care.    Patient expressed understanding and was in agreement with this plan. He also understands that He can call clinic at any time with any questions,  concerns, or complaints.    Lloyd Huger, MD   08/13/2016 10:34 AM

## 2016-08-13 ENCOUNTER — Inpatient Hospital Stay (HOSPITAL_BASED_OUTPATIENT_CLINIC_OR_DEPARTMENT_OTHER): Payer: PPO | Admitting: Oncology

## 2016-08-13 VITALS — BP 165/72 | HR 48 | Temp 97.8°F | Resp 20 | Wt 192.7 lb

## 2016-08-13 DIAGNOSIS — J90 Pleural effusion, not elsewhere classified: Secondary | ICD-10-CM | POA: Diagnosis not present

## 2016-08-13 DIAGNOSIS — I129 Hypertensive chronic kidney disease with stage 1 through stage 4 chronic kidney disease, or unspecified chronic kidney disease: Secondary | ICD-10-CM

## 2016-08-13 DIAGNOSIS — Z79899 Other long term (current) drug therapy: Secondary | ICD-10-CM

## 2016-08-13 DIAGNOSIS — D472 Monoclonal gammopathy: Secondary | ICD-10-CM | POA: Diagnosis not present

## 2016-08-13 DIAGNOSIS — N189 Chronic kidney disease, unspecified: Secondary | ICD-10-CM

## 2016-08-13 NOTE — Progress Notes (Signed)
Patient denies any concerns today.  

## 2016-08-15 DIAGNOSIS — I4891 Unspecified atrial fibrillation: Secondary | ICD-10-CM | POA: Diagnosis not present

## 2016-08-15 DIAGNOSIS — I129 Hypertensive chronic kidney disease with stage 1 through stage 4 chronic kidney disease, or unspecified chronic kidney disease: Secondary | ICD-10-CM | POA: Diagnosis not present

## 2016-08-15 DIAGNOSIS — I5022 Chronic systolic (congestive) heart failure: Secondary | ICD-10-CM | POA: Diagnosis not present

## 2016-08-15 DIAGNOSIS — D472 Monoclonal gammopathy: Secondary | ICD-10-CM | POA: Diagnosis not present

## 2016-08-15 DIAGNOSIS — N183 Chronic kidney disease, stage 3 (moderate): Secondary | ICD-10-CM | POA: Diagnosis not present

## 2016-08-21 DIAGNOSIS — Z85820 Personal history of malignant melanoma of skin: Secondary | ICD-10-CM | POA: Diagnosis not present

## 2016-08-21 DIAGNOSIS — D225 Melanocytic nevi of trunk: Secondary | ICD-10-CM | POA: Diagnosis not present

## 2016-08-21 DIAGNOSIS — D2261 Melanocytic nevi of right upper limb, including shoulder: Secondary | ICD-10-CM | POA: Diagnosis not present

## 2016-08-21 DIAGNOSIS — L821 Other seborrheic keratosis: Secondary | ICD-10-CM | POA: Diagnosis not present

## 2016-08-22 DIAGNOSIS — J90 Pleural effusion, not elsewhere classified: Secondary | ICD-10-CM | POA: Diagnosis not present

## 2016-08-22 DIAGNOSIS — R05 Cough: Secondary | ICD-10-CM | POA: Diagnosis not present

## 2016-08-22 DIAGNOSIS — I5021 Acute systolic (congestive) heart failure: Secondary | ICD-10-CM | POA: Diagnosis not present

## 2016-08-31 DIAGNOSIS — R609 Edema, unspecified: Secondary | ICD-10-CM | POA: Diagnosis not present

## 2016-08-31 DIAGNOSIS — R809 Proteinuria, unspecified: Secondary | ICD-10-CM | POA: Diagnosis not present

## 2016-08-31 DIAGNOSIS — I10 Essential (primary) hypertension: Secondary | ICD-10-CM | POA: Diagnosis not present

## 2016-08-31 DIAGNOSIS — N183 Chronic kidney disease, stage 3 (moderate): Secondary | ICD-10-CM | POA: Diagnosis not present

## 2016-08-31 DIAGNOSIS — R601 Generalized edema: Secondary | ICD-10-CM | POA: Diagnosis not present

## 2016-09-05 ENCOUNTER — Other Ambulatory Visit: Payer: Self-pay | Admitting: Nephrology

## 2016-09-05 DIAGNOSIS — N183 Chronic kidney disease, stage 3 unspecified: Secondary | ICD-10-CM

## 2016-09-11 ENCOUNTER — Ambulatory Visit
Admission: RE | Admit: 2016-09-11 | Discharge: 2016-09-11 | Disposition: A | Discharge status: Home / Self Care | Payer: PPO | Source: Ambulatory Visit | Attending: Nephrology | Admitting: Nephrology

## 2016-09-11 DIAGNOSIS — R809 Proteinuria, unspecified: Secondary | ICD-10-CM | POA: Insufficient documentation

## 2016-09-11 DIAGNOSIS — I10 Essential (primary) hypertension: Secondary | ICD-10-CM | POA: Diagnosis not present

## 2016-09-11 DIAGNOSIS — N183 Chronic kidney disease, stage 3 unspecified: Secondary | ICD-10-CM

## 2016-09-11 DIAGNOSIS — R609 Edema, unspecified: Secondary | ICD-10-CM | POA: Diagnosis not present

## 2016-09-24 DIAGNOSIS — I10 Essential (primary) hypertension: Secondary | ICD-10-CM | POA: Diagnosis not present

## 2016-09-24 DIAGNOSIS — N183 Chronic kidney disease, stage 3 (moderate): Secondary | ICD-10-CM | POA: Diagnosis not present

## 2016-09-24 DIAGNOSIS — R609 Edema, unspecified: Secondary | ICD-10-CM | POA: Diagnosis not present

## 2016-09-24 DIAGNOSIS — D472 Monoclonal gammopathy: Secondary | ICD-10-CM | POA: Diagnosis not present

## 2016-10-01 DIAGNOSIS — Z86010 Personal history of colonic polyps: Secondary | ICD-10-CM | POA: Insufficient documentation

## 2016-10-01 DIAGNOSIS — Z860101 Personal history of adenomatous and serrated colon polyps: Secondary | ICD-10-CM | POA: Insufficient documentation

## 2016-10-08 DIAGNOSIS — I10 Essential (primary) hypertension: Secondary | ICD-10-CM | POA: Diagnosis not present

## 2016-10-08 DIAGNOSIS — N183 Chronic kidney disease, stage 3 (moderate): Secondary | ICD-10-CM | POA: Diagnosis not present

## 2016-10-08 DIAGNOSIS — R809 Proteinuria, unspecified: Secondary | ICD-10-CM | POA: Diagnosis not present

## 2016-10-08 DIAGNOSIS — D472 Monoclonal gammopathy: Secondary | ICD-10-CM | POA: Diagnosis not present

## 2016-10-16 DIAGNOSIS — I5022 Chronic systolic (congestive) heart failure: Secondary | ICD-10-CM | POA: Diagnosis not present

## 2016-10-16 DIAGNOSIS — N183 Chronic kidney disease, stage 3 (moderate): Secondary | ICD-10-CM | POA: Diagnosis not present

## 2016-10-16 DIAGNOSIS — I129 Hypertensive chronic kidney disease with stage 1 through stage 4 chronic kidney disease, or unspecified chronic kidney disease: Secondary | ICD-10-CM | POA: Diagnosis not present

## 2016-10-16 DIAGNOSIS — R001 Bradycardia, unspecified: Secondary | ICD-10-CM | POA: Diagnosis not present

## 2016-10-16 DIAGNOSIS — Z01818 Encounter for other preprocedural examination: Secondary | ICD-10-CM | POA: Diagnosis not present

## 2016-10-16 DIAGNOSIS — I480 Paroxysmal atrial fibrillation: Secondary | ICD-10-CM | POA: Diagnosis not present

## 2016-10-25 ENCOUNTER — Telehealth: Payer: Self-pay | Admitting: *Deleted

## 2016-10-25 ENCOUNTER — Other Ambulatory Visit: Payer: Self-pay

## 2016-10-25 DIAGNOSIS — D472 Monoclonal gammopathy: Secondary | ICD-10-CM

## 2016-10-25 NOTE — Telephone Encounter (Signed)
Orders have been placed.

## 2016-10-25 NOTE — Telephone Encounter (Signed)
Asking if we got results from Nephrologist's office and the fact that his protein is up and what does it mean. 24 hr urine showed M spike of 43.3. Please advise and call patient back.

## 2016-10-25 NOTE — Telephone Encounter (Signed)
Likely nothing, but he can have additional MGUS labs drawn in the next 1-2 weeks to ensure nothing else has changed.

## 2016-10-25 NOTE — Telephone Encounter (Signed)
Patient accepted a lab appointment for 11/6, please enter orders you want drawn

## 2016-11-05 ENCOUNTER — Other Ambulatory Visit: Payer: PPO

## 2016-11-06 ENCOUNTER — Inpatient Hospital Stay: Payer: PPO | Attending: Oncology

## 2016-11-06 DIAGNOSIS — M109 Gout, unspecified: Secondary | ICD-10-CM | POA: Diagnosis not present

## 2016-11-06 DIAGNOSIS — Z85820 Personal history of malignant melanoma of skin: Secondary | ICD-10-CM | POA: Diagnosis not present

## 2016-11-06 DIAGNOSIS — I4891 Unspecified atrial fibrillation: Secondary | ICD-10-CM | POA: Insufficient documentation

## 2016-11-06 DIAGNOSIS — I129 Hypertensive chronic kidney disease with stage 1 through stage 4 chronic kidney disease, or unspecified chronic kidney disease: Secondary | ICD-10-CM | POA: Diagnosis not present

## 2016-11-06 DIAGNOSIS — D472 Monoclonal gammopathy: Secondary | ICD-10-CM | POA: Diagnosis not present

## 2016-11-06 DIAGNOSIS — N189 Chronic kidney disease, unspecified: Secondary | ICD-10-CM | POA: Insufficient documentation

## 2016-11-06 DIAGNOSIS — E7800 Pure hypercholesterolemia, unspecified: Secondary | ICD-10-CM | POA: Diagnosis not present

## 2016-11-06 DIAGNOSIS — N400 Enlarged prostate without lower urinary tract symptoms: Secondary | ICD-10-CM | POA: Insufficient documentation

## 2016-11-06 DIAGNOSIS — J90 Pleural effusion, not elsewhere classified: Secondary | ICD-10-CM | POA: Diagnosis not present

## 2016-11-06 DIAGNOSIS — Z87891 Personal history of nicotine dependence: Secondary | ICD-10-CM | POA: Diagnosis not present

## 2016-11-06 DIAGNOSIS — I509 Heart failure, unspecified: Secondary | ICD-10-CM | POA: Insufficient documentation

## 2016-11-06 DIAGNOSIS — Z79899 Other long term (current) drug therapy: Secondary | ICD-10-CM | POA: Insufficient documentation

## 2016-11-06 LAB — CBC WITH DIFFERENTIAL/PLATELET
BASOS ABS: 0 10*3/uL (ref 0–0.1)
Basophils Relative: 1 %
EOS PCT: 5 %
Eosinophils Absolute: 0.2 10*3/uL (ref 0–0.7)
HCT: 39 % — ABNORMAL LOW (ref 40.0–52.0)
HEMOGLOBIN: 13.2 g/dL (ref 13.0–18.0)
LYMPHS PCT: 13 %
Lymphs Abs: 0.6 10*3/uL — ABNORMAL LOW (ref 1.0–3.6)
MCH: 33 pg (ref 26.0–34.0)
MCHC: 33.7 g/dL (ref 32.0–36.0)
MCV: 97.8 fL (ref 80.0–100.0)
Monocytes Absolute: 0.2 10*3/uL (ref 0.2–1.0)
Monocytes Relative: 5 %
NEUTROS ABS: 3.3 10*3/uL (ref 1.4–6.5)
NEUTROS PCT: 76 %
PLATELETS: 206 10*3/uL (ref 150–440)
RBC: 3.99 MIL/uL — AB (ref 4.40–5.90)
RDW: 13.9 % (ref 11.5–14.5)
WBC: 4.4 10*3/uL (ref 3.8–10.6)

## 2016-11-06 LAB — BASIC METABOLIC PANEL
ANION GAP: 7 (ref 5–15)
BUN: 39 mg/dL — ABNORMAL HIGH (ref 6–20)
CO2: 26 mmol/L (ref 22–32)
Calcium: 8.8 mg/dL — ABNORMAL LOW (ref 8.9–10.3)
Chloride: 103 mmol/L (ref 101–111)
Creatinine, Ser: 1.96 mg/dL — ABNORMAL HIGH (ref 0.61–1.24)
GFR calc Af Amer: 36 mL/min — ABNORMAL LOW (ref >60)
GFR, EST NON AFRICAN AMERICAN: 31 mL/min — AB (ref >60)
GLUCOSE: 121 mg/dL — AB (ref 65–99)
POTASSIUM: 4.6 mmol/L (ref 3.5–5.1)
Sodium: 136 mmol/L (ref 135–145)

## 2016-11-07 LAB — KAPPA/LAMBDA LIGHT CHAINS
KAPPA FREE LGHT CHN: 98.1 mg/L — AB (ref 3.3–19.4)
KAPPA, LAMDA LIGHT CHAIN RATIO: 0.66 (ref 0.26–1.65)
LAMDA FREE LIGHT CHAINS: 149 mg/L — AB (ref 5.7–26.3)

## 2016-11-07 LAB — PROTEIN ELECTROPHORESIS, SERUM
A/G Ratio: 1.2 (ref 0.7–1.7)
ALPHA-1-GLOBULIN: 0.2 g/dL (ref 0.0–0.4)
ALPHA-2-GLOBULIN: 0.5 g/dL (ref 0.4–1.0)
Albumin ELP: 3.2 g/dL (ref 2.9–4.4)
Beta Globulin: 0.9 g/dL (ref 0.7–1.3)
GAMMA GLOBULIN: 1 g/dL (ref 0.4–1.8)
Globulin, Total: 2.6 g/dL (ref 2.2–3.9)
M-SPIKE, %: 0.5 g/dL — AB
Total Protein ELP: 5.8 g/dL — ABNORMAL LOW (ref 6.0–8.5)

## 2016-11-07 LAB — IGG, IGA, IGM
IGA: 254 mg/dL (ref 61–437)
IgG (Immunoglobin G), Serum: 940 mg/dL (ref 700–1600)
IgM (Immunoglobulin M), Srm: 54 mg/dL (ref 15–143)

## 2016-11-19 DIAGNOSIS — N183 Chronic kidney disease, stage 3 (moderate): Secondary | ICD-10-CM | POA: Diagnosis not present

## 2016-11-19 DIAGNOSIS — I5022 Chronic systolic (congestive) heart failure: Secondary | ICD-10-CM | POA: Diagnosis not present

## 2016-11-19 DIAGNOSIS — I4891 Unspecified atrial fibrillation: Secondary | ICD-10-CM | POA: Diagnosis not present

## 2016-11-19 DIAGNOSIS — I129 Hypertensive chronic kidney disease with stage 1 through stage 4 chronic kidney disease, or unspecified chronic kidney disease: Secondary | ICD-10-CM | POA: Diagnosis not present

## 2016-11-26 DIAGNOSIS — I5022 Chronic systolic (congestive) heart failure: Secondary | ICD-10-CM | POA: Diagnosis not present

## 2016-11-26 DIAGNOSIS — Z0000 Encounter for general adult medical examination without abnormal findings: Secondary | ICD-10-CM | POA: Diagnosis not present

## 2016-11-26 DIAGNOSIS — I480 Paroxysmal atrial fibrillation: Secondary | ICD-10-CM | POA: Diagnosis not present

## 2016-11-26 DIAGNOSIS — I129 Hypertensive chronic kidney disease with stage 1 through stage 4 chronic kidney disease, or unspecified chronic kidney disease: Secondary | ICD-10-CM | POA: Diagnosis not present

## 2016-11-26 DIAGNOSIS — N183 Chronic kidney disease, stage 3 (moderate): Secondary | ICD-10-CM | POA: Diagnosis not present

## 2016-12-18 DIAGNOSIS — D472 Monoclonal gammopathy: Secondary | ICD-10-CM | POA: Diagnosis not present

## 2016-12-18 DIAGNOSIS — R609 Edema, unspecified: Secondary | ICD-10-CM | POA: Diagnosis not present

## 2016-12-18 DIAGNOSIS — I10 Essential (primary) hypertension: Secondary | ICD-10-CM | POA: Diagnosis not present

## 2016-12-18 DIAGNOSIS — N183 Chronic kidney disease, stage 3 (moderate): Secondary | ICD-10-CM | POA: Diagnosis not present

## 2016-12-18 DIAGNOSIS — R809 Proteinuria, unspecified: Secondary | ICD-10-CM | POA: Diagnosis not present

## 2016-12-31 ENCOUNTER — Encounter: Payer: Self-pay | Admitting: *Deleted

## 2017-01-02 ENCOUNTER — Ambulatory Visit
Admission: RE | Admit: 2017-01-02 | Discharge: 2017-01-02 | Disposition: A | Discharge status: Home / Self Care | Payer: PPO | Source: Ambulatory Visit | Attending: Unknown Physician Specialty | Admitting: Unknown Physician Specialty

## 2017-01-02 ENCOUNTER — Encounter: Payer: Self-pay | Admitting: *Deleted

## 2017-01-02 ENCOUNTER — Ambulatory Visit: Payer: PPO | Admitting: Registered Nurse

## 2017-01-02 ENCOUNTER — Encounter
Admission: RE | Disposition: A | Discharge status: Home / Self Care | Payer: Self-pay | Source: Ambulatory Visit | Attending: Unknown Physician Specialty

## 2017-01-02 DIAGNOSIS — I4891 Unspecified atrial fibrillation: Secondary | ICD-10-CM | POA: Diagnosis not present

## 2017-01-02 DIAGNOSIS — I429 Cardiomyopathy, unspecified: Secondary | ICD-10-CM | POA: Insufficient documentation

## 2017-01-02 DIAGNOSIS — Z87891 Personal history of nicotine dependence: Secondary | ICD-10-CM | POA: Insufficient documentation

## 2017-01-02 DIAGNOSIS — K5730 Diverticulosis of large intestine without perforation or abscess without bleeding: Secondary | ICD-10-CM | POA: Diagnosis not present

## 2017-01-02 DIAGNOSIS — Z1211 Encounter for screening for malignant neoplasm of colon: Secondary | ICD-10-CM | POA: Insufficient documentation

## 2017-01-02 DIAGNOSIS — D123 Benign neoplasm of transverse colon: Secondary | ICD-10-CM | POA: Insufficient documentation

## 2017-01-02 DIAGNOSIS — N189 Chronic kidney disease, unspecified: Secondary | ICD-10-CM | POA: Diagnosis not present

## 2017-01-02 DIAGNOSIS — D127 Benign neoplasm of rectosigmoid junction: Secondary | ICD-10-CM | POA: Diagnosis not present

## 2017-01-02 DIAGNOSIS — K5790 Diverticulosis of intestine, part unspecified, without perforation or abscess without bleeding: Secondary | ICD-10-CM | POA: Diagnosis not present

## 2017-01-02 DIAGNOSIS — I130 Hypertensive heart and chronic kidney disease with heart failure and stage 1 through stage 4 chronic kidney disease, or unspecified chronic kidney disease: Secondary | ICD-10-CM | POA: Insufficient documentation

## 2017-01-02 DIAGNOSIS — N400 Enlarged prostate without lower urinary tract symptoms: Secondary | ICD-10-CM | POA: Insufficient documentation

## 2017-01-02 DIAGNOSIS — K640 First degree hemorrhoids: Secondary | ICD-10-CM | POA: Diagnosis not present

## 2017-01-02 DIAGNOSIS — I509 Heart failure, unspecified: Secondary | ICD-10-CM | POA: Insufficient documentation

## 2017-01-02 DIAGNOSIS — M109 Gout, unspecified: Secondary | ICD-10-CM | POA: Insufficient documentation

## 2017-01-02 DIAGNOSIS — I5032 Chronic diastolic (congestive) heart failure: Secondary | ICD-10-CM | POA: Diagnosis not present

## 2017-01-02 DIAGNOSIS — Z85820 Personal history of malignant melanoma of skin: Secondary | ICD-10-CM | POA: Insufficient documentation

## 2017-01-02 DIAGNOSIS — Z86010 Personal history of colonic polyps: Secondary | ICD-10-CM | POA: Insufficient documentation

## 2017-01-02 DIAGNOSIS — E7800 Pure hypercholesterolemia, unspecified: Secondary | ICD-10-CM | POA: Diagnosis not present

## 2017-01-02 DIAGNOSIS — K6389 Other specified diseases of intestine: Secondary | ICD-10-CM | POA: Diagnosis not present

## 2017-01-02 DIAGNOSIS — D120 Benign neoplasm of cecum: Secondary | ICD-10-CM | POA: Diagnosis not present

## 2017-01-02 DIAGNOSIS — Z79899 Other long term (current) drug therapy: Secondary | ICD-10-CM | POA: Diagnosis not present

## 2017-01-02 DIAGNOSIS — K635 Polyp of colon: Secondary | ICD-10-CM | POA: Diagnosis not present

## 2017-01-02 DIAGNOSIS — D122 Benign neoplasm of ascending colon: Secondary | ICD-10-CM | POA: Insufficient documentation

## 2017-01-02 DIAGNOSIS — I110 Hypertensive heart disease with heart failure: Secondary | ICD-10-CM | POA: Diagnosis not present

## 2017-01-02 HISTORY — DX: Personal history of colonic polyps: Z86.010

## 2017-01-02 HISTORY — PX: COLONOSCOPY WITH PROPOFOL: SHX5780

## 2017-01-02 HISTORY — DX: Pleural effusion, not elsewhere classified: J90

## 2017-01-02 HISTORY — DX: Benign prostatic hyperplasia without lower urinary tract symptoms: N40.0

## 2017-01-02 HISTORY — DX: Cardiomyopathy, unspecified: I42.9

## 2017-01-02 HISTORY — DX: Personal history of colon polyps, unspecified: Z86.0100

## 2017-01-02 HISTORY — DX: Heart failure, unspecified: I50.9

## 2017-01-02 SURGERY — COLONOSCOPY WITH PROPOFOL
Anesthesia: General

## 2017-01-02 MED ORDER — SODIUM CHLORIDE 0.9 % IV SOLN: INTRAVENOUS | Status: DC

## 2017-01-02 MED ORDER — PROPOFOL 10 MG/ML IV BOLUS
INTRAVENOUS | Status: DC | PRN
  Administered 2017-01-02 (×2): 20 mg via INTRAVENOUS

## 2017-01-02 MED ORDER — PROPOFOL 10 MG/ML IV BOLUS
INTRAVENOUS | Status: AC
  Filled 2017-01-02: qty 20

## 2017-01-02 MED ORDER — PIPERACILLIN-TAZOBACTAM 3.375 G IVPB
INTRAVENOUS | Status: AC
  Filled 2017-01-02: qty 50

## 2017-01-02 MED ORDER — SODIUM CHLORIDE 0.9 % IV SOLN
INTRAVENOUS | Status: DC
  Administered 2017-01-02: 08:00:00 via INTRAVENOUS
  Administered 2017-01-02: 1000 mL via INTRAVENOUS

## 2017-01-02 MED ORDER — PIPERACILLIN-TAZOBACTAM 3.375 G IVPB 30 MIN
3.3750 g | Freq: Once | INTRAVENOUS | Status: AC
  Administered 2017-01-02: 3.375 g via INTRAVENOUS
  Filled 2017-01-02: qty 50

## 2017-01-02 MED ORDER — PROPOFOL 500 MG/50ML IV EMUL
INTRAVENOUS | Status: DC | PRN
  Administered 2017-01-02: 75 ug/kg/min via INTRAVENOUS

## 2017-01-02 NOTE — Anesthesia Post-op Follow-up Note (Signed)
Anesthesia QCDR form completed.        

## 2017-01-02 NOTE — Transfer of Care (Signed)
Immediate Anesthesia Transfer of Care Note  Patient: Lawrence Perkins.  Procedure(s) Performed: COLONOSCOPY WITH PROPOFOL (N/A )  Patient Location: PACU  Anesthesia Type:General  Level of Consciousness: awake and alert   Airway & Oxygen Therapy: Patient Spontanous Breathing and Patient connected to nasal cannula oxygen  Post-op Assessment: Report given to RN  Post vital signs: Reviewed and stable  Last Vitals:  Vitals:   01/02/17 0800  BP: (!) 176/76  Pulse: (!) 53  Resp: 18  Temp: (!) 36.2 C  SpO2: 100%    Last Pain:  Vitals:   01/02/17 0800  TempSrc: Tympanic         Complications: No apparent anesthesia complications

## 2017-01-02 NOTE — Anesthesia Preprocedure Evaluation (Signed)
Anesthesia Evaluation  Patient identified by MRN, date of birth, ID band Patient awake    Reviewed: Allergy & Precautions, H&P , NPO status , Patient's Chart, lab work & pertinent test results, reviewed documented beta blocker date and time   History of Anesthesia Complications (+) history of anesthetic complications  Airway Mallampati: II   Neck ROM: full    Dental  (+) Teeth Intact   Pulmonary neg pulmonary ROS, former smoker,    Pulmonary exam normal        Cardiovascular Exercise Tolerance: Good hypertension, +CHF  (-) Orthopnea and (-) PND negative cardio ROS Normal cardiovascular exam+ dysrhythmias  Rhythm:regular Rate:Normal     Neuro/Psych negative neurological ROS  negative psych ROS   GI/Hepatic negative GI ROS, Neg liver ROS,   Endo/Other  negative endocrine ROS  Renal/GU Renal diseasenegative Renal ROS  negative genitourinary   Musculoskeletal   Abdominal   Peds  Hematology negative hematology ROS (+)   Anesthesia Other Findings Past Medical History: No date: BPH (benign prostatic hyperplasia) No date: Cardiomyopathy ([place]) No date: CHF (congestive heart failure) ([facility]) No date: Chronic kidney disease No date: Complication of anesthesia     Comment:  has converted to afib during and/or after surgery in the              past No date: Diverticulosis No date: Dysrhythmia     Comment:  afib No date: Gout No date: History of colon polyps No date: Hypercholesteremia No date: Hypertension No date: Melanoma ([place])     Comment:  status post resection from his back No date: MGUS (monoclonal gammopathy of unknown significance) No date: Pleural effusion No date: Prostatism No date: Rectal bleeding Past Surgical History: No date: ANKLE SURGERY     Comment:  following volleyball injury No date: APPENDECTOMY No date: COLONOSCOPY No date: JOINT REPLACEMENT     Comment:  RIGHT TOTAL HIP No date:  TONSILLECTOMY 09/2009: TOTAL HIP ARTHROPLASTY     Comment:  Right femoral neck fracture 03/06/2016: TOTAL HIP ARTHROPLASTY; Left     Comment:  Procedure: TOTAL HIP ARTHROPLASTY ANTERIOR APPROACH;                Surgeon: Hessie Knows, MD;  Location: [facility] ORS;  Service:              Orthopedics;  Laterality: Left; BMI    Body Mass Index:  26.78 kg/m     Reproductive/Obstetrics negative OB ROS                             Anesthesia Physical Anesthesia Plan  ASA: III  Anesthesia Plan: General   Post-op Pain Management:    Induction:   PONV Risk Score and Plan:   Airway Management Planned:   Additional Equipment:   Intra-op Plan:   Post-operative Plan:   Informed Consent: I have reviewed the patients History and Physical, chart, labs and discussed the procedure including the risks, benefits and alternatives for the proposed anesthesia with the patient or authorized representative who has indicated his/her understanding and acceptance.   Dental Advisory Given  Plan Discussed with: CRNA  Anesthesia Plan Comments:         Anesthesia Quick Evaluation

## 2017-01-02 NOTE — H&P (Signed)
Primary Care Physician:  Kirk Ruths, MD Primary Gastroenterologist:  Dr. Vira Agar  Pre-Procedure History & Physical: HPI:  Lawrence Perkins. is a 77 y.o. male is here for an colonoscopy.   Past Medical History:  Diagnosis Date  . BPH (benign prostatic hyperplasia)   . Cardiomyopathy ([place])   . CHF (congestive heart failure) ([place])   . Chronic kidney disease   . Complication of anesthesia    has converted to afib during and/or after surgery in the past  . Diverticulosis   . Dysrhythmia    afib  . Gout   . History of colon polyps   . Hypercholesteremia   . Hypertension   . Melanoma ([place])    status post resection from his back  . MGUS (monoclonal gammopathy of unknown significance)   . Pleural effusion   . Prostatism   . Rectal bleeding     Past Surgical History:  Procedure Laterality Date  . ANKLE SURGERY     following volleyball injury  . APPENDECTOMY    . COLONOSCOPY    . JOINT REPLACEMENT     RIGHT TOTAL HIP  . TONSILLECTOMY    . TOTAL HIP ARTHROPLASTY  09/2009   Right femoral neck fracture  . TOTAL HIP ARTHROPLASTY Left 03/06/2016   Procedure: TOTAL HIP ARTHROPLASTY ANTERIOR APPROACH;  Surgeon: Hessie Knows, MD;  Location: [facility] ORS;  Service: Orthopedics;  Laterality: Left;    Prior to Admission medications   Medication Sig Start Date End Date Taking? Authorizing Provider  carvedilol (COREG) 12.5 MG tablet Take 12.5 mg by mouth 2 (two) times daily.  01/27/16 01/26/17 Yes [provider]  enalapril (VASOTEC) 5 MG tablet Take 5 mg by mouth daily.   Yes [provider]  alendronate (FOSAMAX) 70 MG tablet Take 70 mg by mouth every Sunday. Take with a full glass of water on an empty stomach.     [provider]  amLODipine (NORVASC) 5 MG tablet Take 5 mg by mouth daily.    [provider]  apixaban (ELIQUIS) 5 MG TABS tablet Take 5 mg by mouth 2 (two) times daily.    [provider]  dapagliflozin propanediol  (FARXIGA) 10 MG TABS tablet Take 10 mg by mouth daily. Patient on trial    [provider]  furosemide (LASIX) 20 MG tablet Take 1 tablet (20 mg total) by mouth daily. 04/10/16 05/10/16  Alisa Graff, FNP  tamsulosin (FLOMAX) 0.4 MG CAPS capsule Take 1 capsule (0.4 mg total) by mouth daily. 01/26/16   Zara Council A, PA-C    Allergies as of 11/19/2016  . (No Known Allergies)    Family History  Problem Relation Age of Onset  . Cirrhosis Mother   . Other Father        Lung Fibrosis  . Prostate cancer Neg Hx   . Kidney cancer Neg Hx   . Bladder Cancer Neg Hx     Social History   Socioeconomic History  . Marital status: Married    Spouse name: Not on file  . Number of children: Not on file  . Years of education: Not on file  . Highest education level: Not on file  Social Needs  . Financial resource strain: Not on file  . Food insecurity - worry: Not on file  . Food insecurity - inability: Not on file  . Transportation needs - medical: Not on file  . Transportation needs - non-medical: Not on file  Occupational History  .  Occupation: Retired  Tobacco Use  . Smoking status: Former Smoker    Years: 15.00    Last attempt to quit: 01/01/1981    Years since quitting: 36.0  . Smokeless tobacco: Never Used  Substance and Sexual Activity  . Alcohol use: Yes    Alcohol/week: 1.2 oz    Types: 2 Cans of beer per week  . Drug use: No  . Sexual activity: Not on file  Other Topics Concern  . Not on file  Social History Narrative   Regular exercise: Yes    Review of Systems: See HPI, otherwise negative ROS  Physical Exam: BP (!) 176/76   Pulse (!) 53   Temp (!) 97.2 F (36.2 C) (Tympanic)   Resp 18   Ht 5\' 11"  (1.803 m)   Wt 87.1 kg (192 lb)   SpO2 100%   BMI 26.78 kg/m  General:   Alert,  pleasant and cooperative in NAD Head:  Normocephalic and atraumatic. Neck:  Supple; no masses or thyromegaly. Lungs:  Clear throughout to auscultation.    Heart:   Regular rate and rhythm. Abdomen:  Soft, nontender and nondistended. Normal bowel sounds, without guarding, and without rebound.   Neurologic:  Alert and  oriented x4;  grossly normal neurologically.  Impression/Plan: Lawrence Perkins. is here for an colonoscopy to be performed for [facility] colon polyps.  Risks, benefits, limitations, and alternatives regarding  colonoscopy have been reviewed with the patient.  Questions have been answered.  All parties agreeable.   Gaylyn Cheers, MD  01/02/2017, 8:17 AM

## 2017-01-02 NOTE — Op Note (Signed)
[facility] Gastroenterology Patient Name: Lawrence Perkins Procedure Date: 01/02/2017 8:08 AM MRN: [id number] Account #: [id number] Date of Birth: [birth year] Admit Type: Outpatient Age: 77 Room: [facility] ENDO ROOM 3 Gender: Male Note Status: Finalized Procedure:            Colonoscopy Indications:          High risk colon cancer surveillance: Personal history                        of colonic polyps Providers:            Manya Silvas, MD Referring MD:         Ocie Cornfield. Ouida Sills MD, MD (Referring MD) Medicines:            Propofol per Anesthesia Complications:        No immediate complications. Procedure:            Pre-Anesthesia Assessment:                       - After reviewing the risks and benefits, the patient                        was deemed in satisfactory condition to undergo the                        procedure.                       After obtaining informed consent, the colonoscope was                        passed under direct vision. Throughout the procedure,                        the patient's blood pressure, pulse, and oxygen                        saturations were monitored continuously. The                        Colonoscope was introduced through the anus and                        advanced to the the cecum, identified by appendiceal                        orifice and ileocecal valve. The colonoscopy was                        performed without difficulty. The patient tolerated the                        procedure well. The quality of the bowel preparation                        was good. Findings:      Four sessile polyps were found in the splenic flexure, ascending colon       and cecum. The polyps were diminutive in size. These polyps were removed       with a jumbo cold forceps. Resection and retrieval were complete.  Two sessile polyps were found in the proximal ascending colon. The       polyps were small in size. These polyps were  removed with a hot snare.       Resection and retrieval were complete.      A small polyp was found in the transverse colon. The polyp was sessile.       The polyp was removed with a hot snare. Resection and retrieval were       complete.      A diminutive polyp was found in the recto-sigmoid colon. The polyp was       sessile. The polyp was removed with a hot snare. Resection and retrieval       were complete.      Many small-mouthed diverticula were found in the sigmoid colon.      Internal hemorrhoids were found during endoscopy. The hemorrhoids were       small and Grade I (internal hemorrhoids that do not prolapse). Impression:           - Four diminutive polyps at the splenic flexure, in the                        ascending colon and in the cecum, removed with a jumbo                        cold forceps. Resected and retrieved.                       - Two small polyps in the proximal ascending colon,                        removed with a hot snare. Resected and retrieved.                       - One small polyp in the transverse colon, removed with                        a hot snare. Resected and retrieved.                       - One diminutive polyp at the recto-sigmoid colon,                        removed with a hot snare. Resected and retrieved.                       - Diverticulosis in the sigmoid colon.                       - Internal hemorrhoids. Recommendation:       - Await pathology results. Manya Silvas, MD 01/02/2017 9:10:56 AM This report has been signed electronically. Number of Addenda: 0 Note Initiated On: 01/02/2017 8:08 AM Scope Withdrawal Time: 0 hours 28 minutes 2 seconds  Total Procedure Duration: 0 hours 39 minutes 8 seconds       [facility]

## 2017-01-03 ENCOUNTER — Encounter: Payer: Self-pay | Admitting: Unknown Physician Specialty

## 2017-01-03 LAB — SURGICAL PATHOLOGY

## 2017-01-03 NOTE — Anesthesia Postprocedure Evaluation (Signed)
Anesthesia Post Note  Patient: Lawrence Perkins.  Procedure(s) Performed: COLONOSCOPY WITH PROPOFOL (N/A )  Patient location during evaluation: PACU Anesthesia Type: General Level of consciousness: awake and alert Pain management: pain level controlled Vital Signs Assessment: post-procedure vital signs reviewed and stable Respiratory status: spontaneous breathing, nonlabored ventilation, respiratory function stable and patient connected to nasal cannula oxygen Cardiovascular status: blood pressure returned to baseline and stable Postop Assessment: no apparent nausea or vomiting Anesthetic complications: no     Last Vitals:  Vitals:   01/02/17 0940 01/02/17 0950  BP: (!) 160/80 (!) 176/83  Pulse: (!) 52 (!) 49  Resp: 14 13  Temp:    SpO2: 100% 100%    Last Pain:  Vitals:   01/03/17 0736  TempSrc:   PainSc: 0-No pain                 Molli Barrows

## 2017-01-07 DIAGNOSIS — H2513 Age-related nuclear cataract, bilateral: Secondary | ICD-10-CM | POA: Diagnosis not present

## 2017-01-07 DIAGNOSIS — H353131 Nonexudative age-related macular degeneration, bilateral, early dry stage: Secondary | ICD-10-CM | POA: Diagnosis not present

## 2017-01-07 DIAGNOSIS — H35039 Hypertensive retinopathy, unspecified eye: Secondary | ICD-10-CM | POA: Diagnosis not present

## 2017-01-07 DIAGNOSIS — D3131 Benign neoplasm of right choroid: Secondary | ICD-10-CM | POA: Diagnosis not present

## 2017-01-21 ENCOUNTER — Other Ambulatory Visit: Payer: Self-pay | Admitting: Urology

## 2017-01-21 NOTE — Progress Notes (Signed)
01/22/2017 3:52 PM   Lawrence Perkins. Jan 30, 1940 [id number]  Referring provider: Kirk Ruths, MD [facility] [place], [place] [address]  Chief Complaint  Patient presents with  . Follow-up    Return in about 1 year (around 01/25/2017) for IPSS and exam    HPI: Patient is a 77 year old Caucasian male with BPH with LU TS who is a former patient of [name] who would like to transfer his care to a local urologist.   BPH WITH LUTS His IPSS score today is 3, which is mild lower urinary tract symptomatology. He is mostly satisfied  with his quality life due to his urinary symptoms.    His previous I PSS score was 8/2.  His major complaint today frequency and nocturia.   He has had these symptoms for several years.  He denies any dysuria, hematuria or suprapubic pain.   He currently taking tamsulosin 0.4 mg daily.  He also denies any recent fevers, chills, nausea or vomiting.  He does not have a family history of PCa.  IPSS    Row Name 01/22/17 1500         International Prostate Symptom Score   How often have you had the sensation of not emptying your bladder?  Not at All     How often have you had to urinate less than every two hours?  Less than 1 in 5 times     How often have you found you stopped and started again several times when you urinated?  Not at All     How often have you found it difficult to postpone urination?  Not at All     How often have you had a weak urinary stream?  Not at All     How often have you had to strain to start urination?  Not at All     How many times did you typically get up at night to urinate?  2 Times     Total IPSS Score  3       Quality of Life due to urinary symptoms   If you were to spend the rest of your life with your urinary condition just the way it is now how would you feel about that?  Mostly Satisfied        Score:  1-7 Mild 8-19 Moderate 20-35 Severe      PMH: Past  Medical History:  Diagnosis Date  . BPH (benign prostatic hyperplasia)   . Cardiomyopathy ([place])   . CHF (congestive heart failure) ([place])   . Chronic kidney disease   . Complication of anesthesia    has converted to afib during and/or after surgery in the past  . Diverticulosis   . Dysrhythmia    afib  . Gout   . History of colon polyps   . Hypercholesteremia   . Hypertension   . 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 MGUS (monoclonal gammopathy of unknown significance)   . Pleural effusion   . Prostatism   . Rectal bleeding     Surgical History: Past Surgical History:  Procedure Laterality Date  . ANKLE SURGERY     following volleyball injury  . APPENDECTOMY    . COLONOSCOPY    . COLONOSCOPY WITH PROPOFOL N/A 01/02/2017   Procedure: COLONOSCOPY WITH PROPOFOL;  Surgeon: Manya Silvas, MD;  Location: [facility] ENDOSCOPY;  Service: Endoscopy;  Laterality: N/A;  . JOINT REPLACEMENT     RIGHT TOTAL HIP  . TONSILLECTOMY    . TOTAL HIP ARTHROPLASTY  09/2009   Right femoral neck fracture  . TOTAL HIP ARTHROPLASTY Left 03/06/2016   Procedure: TOTAL HIP ARTHROPLASTY ANTERIOR APPROACH;  Surgeon: Hessie Knows, MD;  Location: [facility] ORS;  Service: Orthopedics;  Laterality: Left;    Home Medications:  Allergies as of 01/22/2017   No Known Allergies     Medication List        Accurate as of 01/22/17  3:52 PM. Always use your most recent med list.          alendronate 70 MG tablet Commonly known as:  FOSAMAX   amLODipine 5 MG tablet Commonly known as:  NORVASC Take 5 mg by mouth daily.   amoxicillin-clavulanate 500-125 MG tablet Commonly known as:  AUGMENTIN   aspirin 81 MG tablet Take 81 mg by mouth.   carvedilol 12.5 MG tablet Commonly known as:  COREG Take 12.5 mg by mouth 2 (two) times daily.   cephALEXin 500 MG capsule Commonly known as:  KEFLEX   chlorhexidine 0.12 % solution Commonly known as:  PERIDEX   dapagliflozin propanediol 10 MG Tabs  tablet Commonly known as:  FARXIGA Take 10 mg by mouth daily. Patient on trial   ELIQUIS 5 MG Tabs tablet Generic drug:  apixaban Take 5 mg by mouth 2 (two) times daily.   enalapril 5 MG tablet Commonly known as:  VASOTEC Take 5 mg by mouth daily.   furosemide 20 MG tablet Commonly known as:  LASIX Take 1 tablet (20 mg total) by mouth daily.   metoprolol succinate 50 MG 24 hr tablet Commonly known as:  TOPROL-XL Take 50 mg by mouth.   tamsulosin 0.4 MG Caps capsule Commonly known as:  FLOMAX Take 1 capsule (0.4 mg total) by mouth daily.   Vitamin D3 2000 units capsule Take by mouth.       Allergies: No Known Allergies  Family History: Family History  Problem Relation Age of Onset  . Cirrhosis Mother   . Other Father        Lung Fibrosis  . Prostate cancer Neg Hx   . Kidney cancer Neg Hx   . Bladder Cancer Neg Hx     Social History:  reports that he quit smoking about 36 years ago. He quit after 15.00 years of use. he has never used smokeless tobacco. He reports that he drinks about 1.2 oz of alcohol per week. He reports that he does not use drugs.  ROS: UROLOGY Frequent Urination?: No Hard to postpone urination?: No Burning/pain with urination?: No Get up at night to urinate?: Yes Leakage of urine?: No Urine stream starts and stops?: No Trouble starting stream?: No Do you have to strain to urinate?: No Blood in urine?: No Urinary tract infection?: No Sexually transmitted disease?: No Injury to kidneys or bladder?: No Painful intercourse?: No Weak stream?: No Erection problems?: No Penile pain?: No  Gastrointestinal Nausea?: No Vomiting?: No Indigestion/heartburn?: No Diarrhea?: No Constipation?: No  Constitutional Fever: No Night sweats?: No Weight loss?: No Fatigue?: No  Skin Skin rash/lesions?: No Itching?: No  Eyes Blurred vision?: No Double vision?: No  Ears/Nose/Throat Sore throat?: No Sinus problems?:  No  Hematologic/Lymphatic Swollen glands?: No Easy bruising?: No  Cardiovascular Leg swelling?: No Chest pain?: No  Respiratory Cough?: No Shortness of breath?: No  Endocrine Excessive thirst?: No  Musculoskeletal Back pain?: No Joint pain?: No  Neurological Headaches?: No Dizziness?: No  Psychologic Depression?: No Anxiety?: No  Physical Exam: BP (!) 176/83   Pulse 80   Ht 5\' 11"  (1.803 m)   Wt 196 lb (88.9 kg)   BMI 27.34 kg/m   Constitutional: Well nourished. Alert and oriented, No acute distress. HEENT: [place] AT, moist mucus membranes. Trachea midline, no masses. Cardiovascular: No clubbing, cyanosis, or edema. Respiratory: Normal respiratory effort, no increased work of breathing. GI: Abdomen is soft, non tender, non distended, no abdominal masses. Liver and spleen not palpable.  No hernias appreciated.  Stool sample for occult testing is not indicated.   GU: No CVA tenderness.  No bladder fullness or masses.  Patient with circumcised phallus.  Urethral meatus is patent.  No penile discharge. No penile lesions or rashes. Scrotum without lesions, cysts, rashes and/or edema.  Testicles are located scrotally bilaterally. No masses are appreciated in the testicles. Left and right epididymis are normal. Rectal: Patient with  normal sphincter tone. Anus and perineum without scarring or rashes. No rectal masses are appreciated. Prostate is approximately 50 grams, no nodules are appreciated. Seminal vesicles are normal. Skin: No rashes, bruises or suspicious lesions. Lymph: No cervical or inguinal adenopathy. Neurologic: Grossly intact, no focal deficits, moving all 4 extremities. Psychiatric: Normal mood and affect.   Laboratory Data: Lab Results  Component Value Date   WBC 4.4 11/06/2016   HGB 13.2 11/06/2016   HCT 39.0 (L) 11/06/2016   MCV 97.8 11/06/2016   PLT 206 11/06/2016    Lab Results  Component Value Date   CREATININE 1.96 (H) 11/06/2016   I have  reviewed the labs.  Assessment & Plan:    1. BPH with LUTS  - IPSS score is 3/2, it is improved  - Continue conservative management, avoiding bladder irritants and timed voiding's  - Continue tamsulosin 0.4 mg daily; :refills given  - RTC in 12 months for IPSS and exam    Return in about 1 year (around 01/22/2018) for I PSS and exam.  These notes generated with voice recognition software. I apologize for typographical errors.  Zara Council, [facility] [address] [place], [place] [address] [phone]

## 2017-01-22 ENCOUNTER — Encounter: Payer: Self-pay | Admitting: Urology

## 2017-01-22 ENCOUNTER — Ambulatory Visit: Payer: PPO | Admitting: Urology

## 2017-01-22 VITALS — BP 176/83 | HR 80 | Ht 71.0 in | Wt 196.0 lb

## 2017-01-22 DIAGNOSIS — N401 Enlarged prostate with lower urinary tract symptoms: Secondary | ICD-10-CM

## 2017-01-22 DIAGNOSIS — N138 Other obstructive and reflux uropathy: Secondary | ICD-10-CM | POA: Diagnosis not present

## 2017-01-22 MED ORDER — TAMSULOSIN HCL 0.4 MG PO CAPS: 0.4000 mg | ORAL_CAPSULE | Freq: Every day | ORAL | 3 refills | Status: DC

## 2017-01-24 ENCOUNTER — Ambulatory Visit: Payer: PPO | Admitting: Urology

## 2017-02-13 ENCOUNTER — Inpatient Hospital Stay: Payer: PPO | Attending: Oncology

## 2017-02-13 DIAGNOSIS — Z79899 Other long term (current) drug therapy: Secondary | ICD-10-CM | POA: Insufficient documentation

## 2017-02-13 DIAGNOSIS — N189 Chronic kidney disease, unspecified: Secondary | ICD-10-CM | POA: Insufficient documentation

## 2017-02-13 DIAGNOSIS — Z85820 Personal history of malignant melanoma of skin: Secondary | ICD-10-CM | POA: Diagnosis not present

## 2017-02-13 DIAGNOSIS — I130 Hypertensive heart and chronic kidney disease with heart failure and stage 1 through stage 4 chronic kidney disease, or unspecified chronic kidney disease: Secondary | ICD-10-CM | POA: Insufficient documentation

## 2017-02-13 DIAGNOSIS — M109 Gout, unspecified: Secondary | ICD-10-CM | POA: Diagnosis not present

## 2017-02-13 DIAGNOSIS — E7800 Pure hypercholesterolemia, unspecified: Secondary | ICD-10-CM | POA: Diagnosis not present

## 2017-02-13 DIAGNOSIS — J90 Pleural effusion, not elsewhere classified: Secondary | ICD-10-CM | POA: Insufficient documentation

## 2017-02-13 DIAGNOSIS — D472 Monoclonal gammopathy: Secondary | ICD-10-CM | POA: Insufficient documentation

## 2017-02-13 DIAGNOSIS — I4891 Unspecified atrial fibrillation: Secondary | ICD-10-CM | POA: Diagnosis not present

## 2017-02-13 DIAGNOSIS — I429 Cardiomyopathy, unspecified: Secondary | ICD-10-CM | POA: Diagnosis not present

## 2017-02-13 DIAGNOSIS — Z87891 Personal history of nicotine dependence: Secondary | ICD-10-CM | POA: Diagnosis not present

## 2017-02-13 DIAGNOSIS — Z86010 Personal history of colonic polyps: Secondary | ICD-10-CM | POA: Insufficient documentation

## 2017-02-13 DIAGNOSIS — I509 Heart failure, unspecified: Secondary | ICD-10-CM | POA: Insufficient documentation

## 2017-02-13 DIAGNOSIS — N400 Enlarged prostate without lower urinary tract symptoms: Secondary | ICD-10-CM | POA: Diagnosis not present

## 2017-02-13 LAB — CBC WITH DIFFERENTIAL/PLATELET
BASOS PCT: 1 %
Basophils Absolute: 0 10*3/uL (ref 0–0.1)
Eosinophils Absolute: 0.2 10*3/uL (ref 0–0.7)
Eosinophils Relative: 4 %
HEMATOCRIT: 39.6 % — AB (ref 40.0–52.0)
Hemoglobin: 13.3 g/dL (ref 13.0–18.0)
LYMPHS ABS: 0.6 10*3/uL — AB (ref 1.0–3.6)
LYMPHS PCT: 12 %
MCH: 33.2 pg (ref 26.0–34.0)
MCHC: 33.5 g/dL (ref 32.0–36.0)
MCV: 99 fL (ref 80.0–100.0)
MONO ABS: 0.3 10*3/uL (ref 0.2–1.0)
MONOS PCT: 6 %
NEUTROS ABS: 3.4 10*3/uL (ref 1.4–6.5)
Neutrophils Relative %: 77 %
Platelets: 215 10*3/uL (ref 150–440)
RBC: 4.01 MIL/uL — ABNORMAL LOW (ref 4.40–5.90)
RDW: 13.9 % (ref 11.5–14.5)
WBC: 4.5 10*3/uL (ref 3.8–10.6)

## 2017-02-13 LAB — BASIC METABOLIC PANEL
ANION GAP: 5 (ref 5–15)
BUN: 38 mg/dL — ABNORMAL HIGH (ref 6–20)
CALCIUM: 8.9 mg/dL (ref 8.9–10.3)
CHLORIDE: 106 mmol/L (ref 101–111)
CO2: 26 mmol/L (ref 22–32)
Creatinine, Ser: 2.14 mg/dL — ABNORMAL HIGH (ref 0.61–1.24)
GFR calc Af Amer: 33 mL/min — ABNORMAL LOW (ref >60)
GFR calc non Af Amer: 28 mL/min — ABNORMAL LOW (ref >60)
GLUCOSE: 91 mg/dL (ref 65–99)
Potassium: 5.9 mmol/L — ABNORMAL HIGH (ref 3.5–5.1)
Sodium: 137 mmol/L (ref 135–145)

## 2017-02-14 LAB — KAPPA/LAMBDA LIGHT CHAINS
KAPPA FREE LGHT CHN: 94.2 mg/L — AB (ref 3.3–19.4)
Kappa, lambda light chain ratio: 0.8 (ref 0.26–1.65)
Lambda free light chains: 118.1 mg/L — ABNORMAL HIGH (ref 5.7–26.3)

## 2017-02-14 LAB — PROTEIN ELECTROPHORESIS, SERUM
A/G RATIO SPE: 1.4 (ref 0.7–1.7)
ALPHA-1-GLOBULIN: 0.2 g/dL (ref 0.0–0.4)
ALPHA-2-GLOBULIN: 0.5 g/dL (ref 0.4–1.0)
Albumin ELP: 3.4 g/dL (ref 2.9–4.4)
BETA GLOBULIN: 0.9 g/dL (ref 0.7–1.3)
Gamma Globulin: 0.9 g/dL (ref 0.4–1.8)
Globulin, Total: 2.5 g/dL (ref 2.2–3.9)
M-Spike, %: 0.4 g/dL — ABNORMAL HIGH
Total Protein ELP: 5.9 g/dL — ABNORMAL LOW (ref 6.0–8.5)

## 2017-02-14 LAB — IGG, IGA, IGM
IGM (IMMUNOGLOBULIN M), SRM: 53 mg/dL (ref 15–143)
IgA: 275 mg/dL (ref 61–437)
IgG (Immunoglobin G), Serum: 908 mg/dL (ref 700–1600)

## 2017-02-17 NOTE — Progress Notes (Signed)
[name]  [phone] [phone]  ID: Lawrence Perkins. OB: Feb 12, 1940  MR#: [id number]  TTS#:[id number]  Patient Care Team: Kirk Ruths, MD as PCP - General (Internal Medicine)  CHIEF COMPLAINT: MGUS.  INTERVAL HISTORY: Patient returns to clinic today for repeat laboratory work and further evaluation. He currently feels well and is asymptomatic. He has no neurologic complaints.  He denies any pain. He has a good appetite and denies weight loss. He has no neurologic complaints. He denies any night sweats. He has no chest pain or shortness of breath. He denies any nausea, vomiting, constipation, or diarrhea. He has no urinary complaints. Patient offers no specific complaints today.  REVIEW OF SYSTEMS:   Review of Systems  Constitutional: Negative.  Negative for fever, malaise/fatigue and weight loss.  Respiratory: Negative.  Negative for cough and shortness of breath.   Cardiovascular: Negative.  Negative for chest pain and leg swelling.  Gastrointestinal: Negative.  Negative for abdominal pain.  Genitourinary: Negative.   Musculoskeletal: Negative.  Negative for back pain and falls.  Neurological: Negative.  Negative for weakness.  Endo/Heme/Allergies: Does not bruise/bleed easily.  Psychiatric/Behavioral: Negative.  The patient is not nervous/anxious.     As per HPI. Otherwise, a complete review of systems is negative.  PAST MEDICAL HISTORY: Past Medical History:  Diagnosis Date  . BPH (benign prostatic hyperplasia)   . Cardiomyopathy ([place])   . CHF (congestive heart failure) ([place])   . Chronic kidney disease   . Complication of anesthesia    has converted to afib during and/or after surgery in the past  . Diverticulosis   . Dysrhythmia    afib  . Gout   . History of colon polyps   . Hypercholesteremia   . Hypertension   . Melanoma ([place])    status post resection from his back  . MGUS (monoclonal gammopathy of unknown  significance)   . Pleural effusion   . Prostatism   . Rectal bleeding     PAST SURGICAL HISTORY: Past Surgical History:  Procedure Laterality Date  . ANKLE SURGERY     following volleyball injury  . APPENDECTOMY    . COLONOSCOPY    . COLONOSCOPY WITH PROPOFOL N/A 01/02/2017   Procedure: COLONOSCOPY WITH PROPOFOL;  Surgeon: Manya Silvas, MD;  Location: [facility] ENDOSCOPY;  Service: Endoscopy;  Laterality: N/A;  . JOINT REPLACEMENT     RIGHT TOTAL HIP  . TONSILLECTOMY    . TOTAL HIP ARTHROPLASTY  09/2009   Right femoral neck fracture  . TOTAL HIP ARTHROPLASTY Left 03/06/2016   Procedure: TOTAL HIP ARTHROPLASTY ANTERIOR APPROACH;  Surgeon: Hessie Knows, MD;  Location: [facility] ORS;  Service: Orthopedics;  Laterality: Left;    FAMILY HISTORY: Family History  Problem Relation Age of Onset  . Cirrhosis Mother   . Other Father        Lung Fibrosis  . Prostate cancer Neg Hx   . Kidney cancer Neg Hx   . Bladder Cancer Neg Hx     ADVANCED DIRECTIVES (Y/N):  N  HEALTH MAINTENANCE: Social History   Tobacco Use  . Smoking status: Former Smoker    Years: 15.00    Last attempt to quit: 01/01/1981    Years since quitting: 36.1  . Smokeless tobacco: Never Used  Substance Use Topics  . Alcohol use: Yes    Alcohol/week: 1.2 oz    Types: 2 Cans of beer per week  . Drug use: No  Colonoscopy:  PAP:  Bone density:  Lipid panel:  No Known Allergies  Current Outpatient Medications  Medication Sig Dispense Refill  . amLODipine (NORVASC) 5 MG tablet Take 5 mg by mouth daily.    [name] apixaban (ELIQUIS) 5 MG TABS tablet Take 5 mg by mouth 2 (two) times daily.    . carvedilol (COREG) 12.5 MG tablet Take 12.5 mg by mouth 2 (two) times daily.     . Cholecalciferol (VITAMIN D3) 2000 units capsule Take by mouth.    . dapagliflozin propanediol (FARXIGA) 10 MG TABS tablet Take 10 mg by mouth daily. Patient on trial    . enalapril (VASOTEC) 5 MG tablet Take 5 mg by mouth daily.    . furosemide  (LASIX) 20 MG tablet Take 1 tablet (20 mg total) by mouth daily. 30 tablet 3  . metoprolol succinate (TOPROL-XL) 50 MG 24 hr tablet Take 50 mg by mouth.    . tamsulosin (FLOMAX) 0.4 MG CAPS capsule Take 1 capsule (0.4 mg total) by mouth daily. 90 capsule 3  . amoxicillin-clavulanate (AUGMENTIN) 500-125 MG tablet     . cephALEXin (KEFLEX) 500 MG capsule     . chlorhexidine (PERIDEX) 0.12 % solution      No current facility-administered medications for this visit.     OBJECTIVE: Vitals:   02/20/17 1117  BP: (!) 161/76  Pulse: (!) 51  Resp: 16  Temp: (!) 96.1 F (35.6 C)  SpO2: 100%     Body mass index is 27.62 kg/m.    ECOG FS:0 - Asymptomatic  General: Well-developed, well-nourished, no acute distress. Eyes: Pink conjunctiva, anicteric sclera. Lungs: Clear to auscultation bilaterally. Heart: Regular rate and rhythm. No rubs, murmurs, or gallops. Abdomen: Soft, nontender, nondistended. No organomegaly noted, normoactive bowel sounds. Musculoskeletal: No edema, cyanosis, or clubbing. Neuro: Alert, answering all questions appropriately. Cranial nerves grossly intact. Skin: No rashes or petechiae noted. Psych: Normal affect.   LAB RESULTS:  Lab Results  Component Value Date   NA 137 02/13/2017   K 5.9 (H) 02/13/2017   CL 106 02/13/2017   CO2 26 02/13/2017   GLUCOSE 91 02/13/2017   BUN 38 (H) 02/13/2017   CREATININE 2.14 (H) 02/13/2017   CALCIUM 8.9 02/13/2017   PROT 5.9 (L) 04/09/2016   ALBUMIN 3.1 (L) 04/09/2016   AST 12 (L) 04/09/2016   ALT 8 (L) 04/09/2016   ALKPHOS 67 04/09/2016   BILITOT 0.5 04/09/2016   GFRNONAA 28 (L) 02/13/2017   GFRAA 33 (L) 02/13/2017    Lab Results  Component Value Date   WBC 4.5 02/13/2017   NEUTROABS 3.4 02/13/2017   HGB 13.3 02/13/2017   HCT 39.6 (L) 02/13/2017   MCV 99.0 02/13/2017   PLT 215 02/13/2017   Lab Results  Component Value Date   TOTALPROTELP 5.9 (L) 02/13/2017   ALBUMINELP 3.4 02/13/2017   A1GS 0.2 02/13/2017    A2GS 0.5 02/13/2017   BETS 0.9 02/13/2017   GAMS 0.9 02/13/2017   MSPIKE 0.4 (H) 02/13/2017   SPEI Comment 02/13/2017     STUDIES: No results found.  ASSESSMENT: MGUS  PLAN:    1. MGUS: Patient had a bone marrow biopsy on November 03, 2015 that revealed only a mild increased plasma cell population of approximately 5-10%. Patient was also noted to have cytogenetic abnormality of a monosomy 86 which is an average risk of multiple myeloma. His recent M spike is essentially unchanged at 0.4. He has elevation of both his kappa and lambda free light  chains. Given his bone marrow results, patient is possibly at higher risk for progressing to multiple myeloma. Will consider a metastatic bone survey in the future if there is suspicion of progression of disease. Return to clinic in 6 months with repeat laboratory work and further evaluation. 2. Renal insufficiency: Continue monitoring and treatment per nephrology.  Approximately 20 minutes was spent in discussion of which greater than 50% was consultation.     Patient expressed understanding and was in agreement with this plan. He also understands that He can call clinic at any time with any questions, concerns, or complaints.    Lloyd Huger, MD   02/20/2017 11:28 AM

## 2017-02-19 DIAGNOSIS — I480 Paroxysmal atrial fibrillation: Secondary | ICD-10-CM | POA: Diagnosis not present

## 2017-02-19 DIAGNOSIS — N183 Chronic kidney disease, stage 3 (moderate): Secondary | ICD-10-CM | POA: Diagnosis not present

## 2017-02-19 DIAGNOSIS — I5022 Chronic systolic (congestive) heart failure: Secondary | ICD-10-CM | POA: Diagnosis not present

## 2017-02-19 DIAGNOSIS — I129 Hypertensive chronic kidney disease with stage 1 through stage 4 chronic kidney disease, or unspecified chronic kidney disease: Secondary | ICD-10-CM | POA: Diagnosis not present

## 2017-02-20 ENCOUNTER — Encounter: Payer: Self-pay | Admitting: Oncology

## 2017-02-20 ENCOUNTER — Inpatient Hospital Stay (HOSPITAL_BASED_OUTPATIENT_CLINIC_OR_DEPARTMENT_OTHER): Payer: PPO | Admitting: Oncology

## 2017-02-20 VITALS — BP 161/76 | HR 51 | Temp 96.1°F | Resp 16 | Wt 198.0 lb

## 2017-02-20 DIAGNOSIS — J90 Pleural effusion, not elsewhere classified: Secondary | ICD-10-CM | POA: Diagnosis not present

## 2017-02-20 DIAGNOSIS — D472 Monoclonal gammopathy: Secondary | ICD-10-CM | POA: Diagnosis not present

## 2017-02-20 DIAGNOSIS — Z79899 Other long term (current) drug therapy: Secondary | ICD-10-CM

## 2017-02-20 DIAGNOSIS — R0602 Shortness of breath: Secondary | ICD-10-CM | POA: Diagnosis not present

## 2017-02-21 DIAGNOSIS — Z85820 Personal history of malignant melanoma of skin: Secondary | ICD-10-CM | POA: Diagnosis not present

## 2017-02-21 DIAGNOSIS — D360 Benign neoplasm of lymph nodes: Secondary | ICD-10-CM | POA: Diagnosis not present

## 2017-02-21 DIAGNOSIS — X32XXXA Exposure to sunlight, initial encounter: Secondary | ICD-10-CM | POA: Diagnosis not present

## 2017-02-21 DIAGNOSIS — D485 Neoplasm of uncertain behavior of skin: Secondary | ICD-10-CM | POA: Diagnosis not present

## 2017-02-21 DIAGNOSIS — D2272 Melanocytic nevi of left lower limb, including hip: Secondary | ICD-10-CM | POA: Diagnosis not present

## 2017-02-21 DIAGNOSIS — L820 Inflamed seborrheic keratosis: Secondary | ICD-10-CM | POA: Diagnosis not present

## 2017-02-21 DIAGNOSIS — L570 Actinic keratosis: Secondary | ICD-10-CM | POA: Diagnosis not present

## 2017-02-21 DIAGNOSIS — D225 Melanocytic nevi of trunk: Secondary | ICD-10-CM | POA: Diagnosis not present

## 2017-02-21 DIAGNOSIS — D2261 Melanocytic nevi of right upper limb, including shoulder: Secondary | ICD-10-CM | POA: Diagnosis not present

## 2017-05-14 DIAGNOSIS — N184 Chronic kidney disease, stage 4 (severe): Secondary | ICD-10-CM | POA: Diagnosis not present

## 2017-05-14 DIAGNOSIS — R809 Proteinuria, unspecified: Secondary | ICD-10-CM | POA: Diagnosis not present

## 2017-05-14 DIAGNOSIS — R609 Edema, unspecified: Secondary | ICD-10-CM | POA: Diagnosis not present

## 2017-05-14 DIAGNOSIS — E875 Hyperkalemia: Secondary | ICD-10-CM | POA: Diagnosis not present

## 2017-05-14 DIAGNOSIS — D472 Monoclonal gammopathy: Secondary | ICD-10-CM | POA: Diagnosis not present

## 2017-05-21 DIAGNOSIS — N183 Chronic kidney disease, stage 3 (moderate): Secondary | ICD-10-CM | POA: Diagnosis not present

## 2017-05-21 DIAGNOSIS — I480 Paroxysmal atrial fibrillation: Secondary | ICD-10-CM | POA: Diagnosis not present

## 2017-05-21 DIAGNOSIS — I129 Hypertensive chronic kidney disease with stage 1 through stage 4 chronic kidney disease, or unspecified chronic kidney disease: Secondary | ICD-10-CM | POA: Diagnosis not present

## 2017-05-21 DIAGNOSIS — I5022 Chronic systolic (congestive) heart failure: Secondary | ICD-10-CM | POA: Diagnosis not present

## 2017-05-28 DIAGNOSIS — I5022 Chronic systolic (congestive) heart failure: Secondary | ICD-10-CM | POA: Diagnosis not present

## 2017-05-28 DIAGNOSIS — E7800 Pure hypercholesterolemia, unspecified: Secondary | ICD-10-CM | POA: Diagnosis not present

## 2017-05-28 DIAGNOSIS — D472 Monoclonal gammopathy: Secondary | ICD-10-CM | POA: Diagnosis not present

## 2017-05-28 DIAGNOSIS — N183 Chronic kidney disease, stage 3 (moderate): Secondary | ICD-10-CM | POA: Diagnosis not present

## 2017-05-28 DIAGNOSIS — I129 Hypertensive chronic kidney disease with stage 1 through stage 4 chronic kidney disease, or unspecified chronic kidney disease: Secondary | ICD-10-CM | POA: Diagnosis not present

## 2017-05-28 DIAGNOSIS — R05 Cough: Secondary | ICD-10-CM | POA: Diagnosis not present

## 2017-05-28 DIAGNOSIS — I480 Paroxysmal atrial fibrillation: Secondary | ICD-10-CM | POA: Diagnosis not present

## 2017-06-25 DIAGNOSIS — R9389 Abnormal findings on diagnostic imaging of other specified body structures: Secondary | ICD-10-CM | POA: Diagnosis not present

## 2017-06-25 DIAGNOSIS — J90 Pleural effusion, not elsewhere classified: Secondary | ICD-10-CM | POA: Diagnosis not present

## 2017-07-16 DIAGNOSIS — I129 Hypertensive chronic kidney disease with stage 1 through stage 4 chronic kidney disease, or unspecified chronic kidney disease: Secondary | ICD-10-CM | POA: Diagnosis not present

## 2017-07-16 DIAGNOSIS — R001 Bradycardia, unspecified: Secondary | ICD-10-CM | POA: Diagnosis not present

## 2017-07-16 DIAGNOSIS — N183 Chronic kidney disease, stage 3 (moderate): Secondary | ICD-10-CM | POA: Diagnosis not present

## 2017-07-16 DIAGNOSIS — I480 Paroxysmal atrial fibrillation: Secondary | ICD-10-CM | POA: Diagnosis not present

## 2017-07-16 DIAGNOSIS — E7800 Pure hypercholesterolemia, unspecified: Secondary | ICD-10-CM | POA: Diagnosis not present

## 2017-07-16 DIAGNOSIS — I5022 Chronic systolic (congestive) heart failure: Secondary | ICD-10-CM | POA: Diagnosis not present

## 2017-07-18 DIAGNOSIS — I129 Hypertensive chronic kidney disease with stage 1 through stage 4 chronic kidney disease, or unspecified chronic kidney disease: Secondary | ICD-10-CM | POA: Diagnosis not present

## 2017-07-18 DIAGNOSIS — N401 Enlarged prostate with lower urinary tract symptoms: Secondary | ICD-10-CM | POA: Diagnosis not present

## 2017-07-18 DIAGNOSIS — R809 Proteinuria, unspecified: Secondary | ICD-10-CM | POA: Diagnosis not present

## 2017-07-18 DIAGNOSIS — N184 Chronic kidney disease, stage 4 (severe): Secondary | ICD-10-CM | POA: Diagnosis not present

## 2017-08-01 ENCOUNTER — Other Ambulatory Visit: Payer: Self-pay | Admitting: Internal Medicine

## 2017-08-01 DIAGNOSIS — J90 Pleural effusion, not elsewhere classified: Secondary | ICD-10-CM | POA: Diagnosis not present

## 2017-08-01 DIAGNOSIS — I129 Hypertensive chronic kidney disease with stage 1 through stage 4 chronic kidney disease, or unspecified chronic kidney disease: Secondary | ICD-10-CM | POA: Diagnosis not present

## 2017-08-01 DIAGNOSIS — I480 Paroxysmal atrial fibrillation: Secondary | ICD-10-CM | POA: Diagnosis not present

## 2017-08-01 DIAGNOSIS — I509 Heart failure, unspecified: Secondary | ICD-10-CM | POA: Diagnosis not present

## 2017-08-01 DIAGNOSIS — I5022 Chronic systolic (congestive) heart failure: Secondary | ICD-10-CM | POA: Diagnosis not present

## 2017-08-01 DIAGNOSIS — R001 Bradycardia, unspecified: Secondary | ICD-10-CM | POA: Diagnosis not present

## 2017-08-01 DIAGNOSIS — N183 Chronic kidney disease, stage 3 (moderate): Secondary | ICD-10-CM | POA: Diagnosis not present

## 2017-08-01 DIAGNOSIS — E7800 Pure hypercholesterolemia, unspecified: Secondary | ICD-10-CM | POA: Diagnosis not present

## 2017-08-02 ENCOUNTER — Ambulatory Visit
Admission: RE | Admit: 2017-08-02 | Discharge: 2017-08-02 | Disposition: A | Discharge status: Home / Self Care | Payer: PPO | Source: Ambulatory Visit | Attending: Internal Medicine | Admitting: Internal Medicine

## 2017-08-02 ENCOUNTER — Ambulatory Visit
Admission: RE | Admit: 2017-08-02 | Discharge: 2017-08-02 | Disposition: A | Discharge status: Home / Self Care | Payer: PPO | Source: Ambulatory Visit | Attending: Interventional Radiology | Admitting: Interventional Radiology

## 2017-08-02 DIAGNOSIS — J90 Pleural effusion, not elsewhere classified: Secondary | ICD-10-CM | POA: Insufficient documentation

## 2017-08-02 DIAGNOSIS — Z9889 Other specified postprocedural states: Secondary | ICD-10-CM

## 2017-08-02 LAB — AMYLASE, PLEURAL OR PERITONEAL FLUID: Amylase, Fluid: 33 U/L

## 2017-08-02 LAB — GLUCOSE, PLEURAL OR PERITONEAL FLUID: Glucose, Fluid: 106 mg/dL

## 2017-08-02 LAB — PROTEIN, PLEURAL OR PERITONEAL FLUID

## 2017-08-02 LAB — LACTATE DEHYDROGENASE, PLEURAL OR PERITONEAL FLUID: LD FL: 34 U/L — AB (ref 3–23)

## 2017-08-02 NOTE — Procedures (Signed)
Pre procedural Dx: Symptomatic Pleural effusion Post procedural Dx: Same  Successful US guided right sided thoracentesis yielding 2.5 L of serous pleural fluid.   Samples sent to lab for analysis.  EBL: None  Complications: None immediate.  Ronny Bacon, MD Pager #: [phone]

## 2017-08-03 LAB — PROTEIN, BODY FLUID (OTHER): TOTAL PROTEIN, BODY FLUID OTHER: 1.1 g/dL

## 2017-08-05 LAB — BODY FLUID CULTURE: Culture: NO GROWTH

## 2017-08-05 LAB — CYTOLOGY - NON PAP

## 2017-08-07 DIAGNOSIS — J90 Pleural effusion, not elsewhere classified: Secondary | ICD-10-CM | POA: Insufficient documentation

## 2017-08-19 ENCOUNTER — Other Ambulatory Visit: Payer: Self-pay | Admitting: *Deleted

## 2017-08-19 DIAGNOSIS — D472 Monoclonal gammopathy: Secondary | ICD-10-CM

## 2017-08-20 ENCOUNTER — Other Ambulatory Visit: Payer: Self-pay | Admitting: Internal Medicine

## 2017-08-20 ENCOUNTER — Inpatient Hospital Stay: Payer: PPO | Attending: Oncology

## 2017-08-20 DIAGNOSIS — I480 Paroxysmal atrial fibrillation: Secondary | ICD-10-CM | POA: Diagnosis not present

## 2017-08-20 DIAGNOSIS — D649 Anemia, unspecified: Secondary | ICD-10-CM | POA: Insufficient documentation

## 2017-08-20 DIAGNOSIS — N289 Disorder of kidney and ureter, unspecified: Secondary | ICD-10-CM | POA: Insufficient documentation

## 2017-08-20 DIAGNOSIS — I509 Heart failure, unspecified: Secondary | ICD-10-CM | POA: Diagnosis not present

## 2017-08-20 DIAGNOSIS — R609 Edema, unspecified: Secondary | ICD-10-CM | POA: Diagnosis not present

## 2017-08-20 DIAGNOSIS — J90 Pleural effusion, not elsewhere classified: Secondary | ICD-10-CM | POA: Diagnosis not present

## 2017-08-20 DIAGNOSIS — D472 Monoclonal gammopathy: Secondary | ICD-10-CM | POA: Diagnosis not present

## 2017-08-20 DIAGNOSIS — N184 Chronic kidney disease, stage 4 (severe): Secondary | ICD-10-CM | POA: Diagnosis not present

## 2017-08-20 DIAGNOSIS — I5022 Chronic systolic (congestive) heart failure: Secondary | ICD-10-CM | POA: Diagnosis not present

## 2017-08-20 LAB — CBC WITH DIFFERENTIAL/PLATELET
Basophils Absolute: 0 10*3/uL (ref 0–0.1)
Basophils Relative: 1 %
EOS ABS: 0.2 10*3/uL (ref 0–0.7)
Eosinophils Relative: 3 %
HEMATOCRIT: 33.5 % — AB (ref 40.0–52.0)
HEMOGLOBIN: 11.4 g/dL — AB (ref 13.0–18.0)
LYMPHS ABS: 0.8 10*3/uL — AB (ref 1.0–3.6)
LYMPHS PCT: 15 %
MCH: 33.2 pg (ref 26.0–34.0)
MCHC: 34 g/dL (ref 32.0–36.0)
MCV: 97.6 fL (ref 80.0–100.0)
Monocytes Absolute: 0.3 10*3/uL (ref 0.2–1.0)
Monocytes Relative: 5 %
NEUTROS ABS: 4.2 10*3/uL (ref 1.4–6.5)
NEUTROS PCT: 76 %
Platelets: 272 10*3/uL (ref 150–440)
RBC: 3.44 MIL/uL — AB (ref 4.40–5.90)
RDW: 14.6 % — ABNORMAL HIGH (ref 11.5–14.5)
WBC: 5.5 10*3/uL (ref 3.8–10.6)

## 2017-08-20 LAB — BASIC METABOLIC PANEL
ANION GAP: 8 (ref 5–15)
BUN: 50 mg/dL — ABNORMAL HIGH (ref 8–23)
CHLORIDE: 112 mmol/L — AB (ref 98–111)
CO2: 19 mmol/L — AB (ref 22–32)
Calcium: 8.6 mg/dL — ABNORMAL LOW (ref 8.9–10.3)
Creatinine, Ser: 2.44 mg/dL — ABNORMAL HIGH (ref 0.61–1.24)
GFR calc non Af Amer: 24 mL/min — ABNORMAL LOW (ref >60)
GFR, EST AFRICAN AMERICAN: 28 mL/min — AB (ref >60)
Glucose, Bld: 119 mg/dL — ABNORMAL HIGH (ref 70–99)
POTASSIUM: 4.6 mmol/L (ref 3.5–5.1)
SODIUM: 139 mmol/L (ref 135–145)

## 2017-08-20 NOTE — Discharge Instructions (Signed)
Thoracentesis, Care After °Refer to this sheet in the next few weeks. These instructions provide you with information about caring for yourself after your procedure. Your health care provider may also give you more specific instructions. Your treatment has been planned according to current medical practices, but problems sometimes occur. Call your health care provider if you have any problems or questions after your procedure. °What can I expect after the procedure? °After your procedure, it is common to have pain at the puncture site. °Follow these instructions at home: °· Take medicines only as directed by your health care provider. °· You may return to your normal diet and normal activities as directed by your health care provider. °· Drink enough fluid to keep your urine clear or pale yellow. °· Do not take baths, swim, or use a hot tub until your health care provider approves. °· Follow your health care provider's instructions about: °? Puncture site care. °? Bandage (dressing) changes and removal. °· Check your puncture site every day for signs of infection. Watch for: °? Redness, swelling, or pain. °? Fluid, blood, or pus. °· Keep all follow-up visits as directed by your health care provider. This is important. °Contact a health care provider if: °· You have redness, swelling, or pain at your puncture site. °· You have fluid, blood, or pus coming from your puncture site. °· You have a fever. °· You have chills. °· You have nausea or vomiting. °· You have trouble breathing. °· You develop a worsening cough. °Get help right away if: °· You have extreme shortness of breath. °· You develop chest pain. °· You faint or feel light-headed. °This information is not intended to replace advice given to you by your health care provider. Make sure you discuss any questions you have with your health care provider. °Document Released: 01/08/2014 Document Revised: 08/20/2015 Document Reviewed: 09/29/2013 °Elsevier  Interactive Patient Education © 2018 [employer] ° °

## 2017-08-21 DIAGNOSIS — I129 Hypertensive chronic kidney disease with stage 1 through stage 4 chronic kidney disease, or unspecified chronic kidney disease: Secondary | ICD-10-CM | POA: Diagnosis not present

## 2017-08-21 DIAGNOSIS — R05 Cough: Secondary | ICD-10-CM | POA: Diagnosis not present

## 2017-08-21 DIAGNOSIS — I5022 Chronic systolic (congestive) heart failure: Secondary | ICD-10-CM | POA: Diagnosis not present

## 2017-08-21 DIAGNOSIS — R809 Proteinuria, unspecified: Secondary | ICD-10-CM | POA: Diagnosis not present

## 2017-08-21 DIAGNOSIS — M76891 Other specified enthesopathies of right lower limb, excluding foot: Secondary | ICD-10-CM | POA: Diagnosis not present

## 2017-08-21 DIAGNOSIS — N184 Chronic kidney disease, stage 4 (severe): Secondary | ICD-10-CM | POA: Diagnosis not present

## 2017-08-21 DIAGNOSIS — N183 Chronic kidney disease, stage 3 (moderate): Secondary | ICD-10-CM | POA: Diagnosis not present

## 2017-08-21 DIAGNOSIS — M25551 Pain in right hip: Secondary | ICD-10-CM | POA: Diagnosis not present

## 2017-08-21 LAB — PROTEIN ELECTROPHORESIS, SERUM
A/G RATIO SPE: 1.1 (ref 0.7–1.7)
ALBUMIN ELP: 2.7 g/dL — AB (ref 2.9–4.4)
ALPHA-1-GLOBULIN: 0.3 g/dL (ref 0.0–0.4)
Alpha-2-Globulin: 0.6 g/dL (ref 0.4–1.0)
Beta Globulin: 0.8 g/dL (ref 0.7–1.3)
Gamma Globulin: 0.8 g/dL (ref 0.4–1.8)
Globulin, Total: 2.5 g/dL (ref 2.2–3.9)
M-Spike, %: 0.4 g/dL — ABNORMAL HIGH
TOTAL PROTEIN ELP: 5.2 g/dL — AB (ref 6.0–8.5)

## 2017-08-21 LAB — IGG, IGA, IGM
IGA: 304 mg/dL (ref 61–437)
IGG (IMMUNOGLOBIN G), SERUM: 752 mg/dL (ref 700–1600)
IgM (Immunoglobulin M), Srm: 42 mg/dL (ref 15–143)

## 2017-08-21 LAB — KAPPA/LAMBDA LIGHT CHAINS
KAPPA FREE LGHT CHN: 106.7 mg/L — AB (ref 3.3–19.4)
KAPPA, LAMDA LIGHT CHAIN RATIO: 0.47 (ref 0.26–1.65)
Lambda free light chains: 224.9 mg/L — ABNORMAL HIGH (ref 5.7–26.3)

## 2017-08-22 ENCOUNTER — Ambulatory Visit
Admission: RE | Admit: 2017-08-22 | Discharge: 2017-08-22 | Disposition: A | Discharge status: Home / Self Care | Payer: PPO | Source: Ambulatory Visit | Attending: Diagnostic Radiology | Admitting: Diagnostic Radiology

## 2017-08-22 ENCOUNTER — Ambulatory Visit
Admission: RE | Admit: 2017-08-22 | Discharge: 2017-08-22 | Disposition: A | Discharge status: Home / Self Care | Payer: PPO | Source: Ambulatory Visit | Attending: Internal Medicine | Admitting: Internal Medicine

## 2017-08-22 DIAGNOSIS — I5022 Chronic systolic (congestive) heart failure: Secondary | ICD-10-CM

## 2017-08-22 DIAGNOSIS — J984 Other disorders of lung: Secondary | ICD-10-CM | POA: Diagnosis not present

## 2017-08-22 DIAGNOSIS — Z9889 Other specified postprocedural states: Secondary | ICD-10-CM | POA: Diagnosis not present

## 2017-08-22 DIAGNOSIS — J90 Pleural effusion, not elsewhere classified: Secondary | ICD-10-CM | POA: Insufficient documentation

## 2017-08-22 DIAGNOSIS — I5020 Unspecified systolic (congestive) heart failure: Secondary | ICD-10-CM | POA: Diagnosis not present

## 2017-08-22 DIAGNOSIS — J918 Pleural effusion in other conditions classified elsewhere: Secondary | ICD-10-CM | POA: Diagnosis not present

## 2017-08-22 LAB — AMYLASE, PLEURAL OR PERITONEAL FLUID: Amylase, Fluid: 28 U/L

## 2017-08-22 LAB — GLUCOSE, PLEURAL OR PERITONEAL FLUID: Glucose, Fluid: 105 mg/dL

## 2017-08-22 LAB — PROTEIN, PLEURAL OR PERITONEAL FLUID

## 2017-08-22 NOTE — Procedures (Signed)
US thoracentesis without difficulty   Complications:  None  Blood Loss: none  See dictation in canopy pacs  

## 2017-08-23 LAB — PROTEIN, BODY FLUID (OTHER): TOTAL PROTEIN, BODY FLUID OTHER: 1.2 g/dL

## 2017-08-24 LAB — ACID FAST SMEAR (AFB, MYCOBACTERIA): Acid Fast Smear: NEGATIVE

## 2017-08-24 LAB — ACID FAST SMEAR (AFB)

## 2017-08-25 NOTE — Progress Notes (Signed)
[place]  Telephone:([phone] [phone]  ID: Lawrence Perkins. OB: Jan 22, 1940  MR#: [id number]  IWL#:[id number]  Patient Care Team: Kirk Ruths, MD as PCP - General (Internal Medicine)  CHIEF COMPLAINT: MGUS.  INTERVAL HISTORY: Patient returns to clinic today for repeat laboratory work and routine six-month evaluation.  He is having increased edema as well as pulmonary congestion which is actively being managed by his primary care physician.  He otherwise feels well and is asymptomatic. He has no neurologic complaints.  He denies any pain. He has a good appetite and denies weight loss. He has no neurologic complaints. He denies any night sweats. He has no chest pain or shortness of breath. He denies any nausea, vomiting, constipation, or diarrhea. He has no urinary complaints.  Patient offers no further specific complaints today.  REVIEW OF SYSTEMS:   Review of Systems  Constitutional: Negative.  Negative for fever, malaise/fatigue and weight loss.  Respiratory: Negative.  Negative for cough and shortness of breath.   Cardiovascular: Positive for leg swelling. Negative for chest pain.  Gastrointestinal: Negative.  Negative for abdominal pain.  Genitourinary: Negative.  Negative for dysuria.  Musculoskeletal: Negative.  Negative for back pain.  Skin: Negative.  Negative for rash.  Neurological: Negative.  Negative for focal weakness, weakness and headaches.  Endo/Heme/Allergies: Does not bruise/bleed easily.  Psychiatric/Behavioral: Negative.  The patient is not nervous/anxious.     As per HPI. Otherwise, a complete review of systems is negative.  PAST MEDICAL HISTORY: Past Medical History:  Diagnosis Date  . BPH (benign prostatic hyperplasia)   . Cardiomyopathy ([place])   . CHF (congestive heart failure) ([place])   . Chronic kidney disease   . Complication of anesthesia    has converted to afib during and/or after surgery in the past  .  Diverticulosis   . Dysrhythmia    afib  . Gout   . History of colon polyps   . Hypercholesteremia   . Hypertension   . 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 MGUS (monoclonal gammopathy of unknown significance)   . Pleural effusion   . Prostatism   . Rectal bleeding     PAST SURGICAL HISTORY: Past Surgical History:  Procedure Laterality Date  . ANKLE SURGERY     following volleyball injury  . APPENDECTOMY    . COLONOSCOPY    . COLONOSCOPY WITH PROPOFOL N/A 01/02/2017   Procedure: COLONOSCOPY WITH PROPOFOL;  Surgeon: Manya Silvas, MD;  Location: [facility] ENDOSCOPY;  Service: Endoscopy;  Laterality: N/A;  . JOINT REPLACEMENT     RIGHT TOTAL HIP  . TONSILLECTOMY    . TOTAL HIP ARTHROPLASTY  09/2009   Right femoral neck fracture  . TOTAL HIP ARTHROPLASTY Left 03/06/2016   Procedure: TOTAL HIP ARTHROPLASTY ANTERIOR APPROACH;  Surgeon: Hessie Knows, MD;  Location: [facility] ORS;  Service: Orthopedics;  Laterality: Left;    FAMILY HISTORY: Family History  Problem Relation Age of Onset  . Cirrhosis Mother   . Other Father        Lung Fibrosis  . Prostate cancer Neg Hx   . Kidney cancer Neg Hx   . Bladder Cancer Neg Hx     ADVANCED DIRECTIVES (Y/N):  N  HEALTH MAINTENANCE: Social History   Tobacco Use  . Smoking status: Former Smoker    Years: 15.00    Last attempt to quit: 01/01/1981    Years since quitting: 36.6  . Smokeless tobacco:  Never Used  Substance Use Topics  . Alcohol use: Yes    Alcohol/week: 2.0 standard drinks    Types: 2 Cans of beer per week  . Drug use: No     Colonoscopy:  PAP:  Bone density:  Lipid panel:  No Known Allergies  Current Outpatient Medications  Medication Sig Dispense Refill  . amLODipine (NORVASC) 5 MG tablet Take 5 mg by mouth daily.     [name] apixaban (ELIQUIS) 5 MG TABS tablet Take 5 mg by mouth 2 (two) times daily.    . carvedilol (COREG) 12.5 MG tablet Take 12.5 mg by mouth 2 (two) times daily.     .  Cholecalciferol (VITAMIN D3) 2000 units capsule Take by mouth.    . dapagliflozin propanediol (FARXIGA) 10 MG TABS tablet Take 10 mg by mouth daily. Patient on trial    . enalapril (VASOTEC) 5 MG tablet Take 5 mg by mouth daily.     . tamsulosin (FLOMAX) 0.4 MG CAPS capsule Take 1 capsule (0.4 mg total) by mouth daily. 90 capsule 3  . torsemide (DEMADEX) 20 MG tablet Take 20 mg by mouth.     . chlorhexidine (PERIDEX) 0.12 % solution     . metoprolol succinate (TOPROL-XL) 50 MG 24 hr tablet Take 50 mg by mouth.     No current facility-administered medications for this visit.     OBJECTIVE: Vitals:   08/28/17 1125  BP: (!) 149/80  Pulse: 68  Resp: 18  Temp: (!) 96.1 F (35.6 C)     Body mass index is 27.03 kg/m.    ECOG FS:0 - Asymptomatic  General: Well-developed, well-nourished, no acute distress. Eyes: Pink conjunctiva, anicteric sclera. HEENT: Normocephalic, moist mucous membranes. Lungs: Clear to auscultation bilaterally. Heart: Regular rate and rhythm. No rubs, murmurs, or gallops. Abdomen: Soft, nontender, nondistended. No organomegaly noted, normoactive bowel sounds. Musculoskeletal: 1-2+ lower extremity edema. Neuro: Alert, answering all questions appropriately. Cranial nerves grossly intact. Skin: No rashes or petechiae noted. Psych: Normal affect.  LAB RESULTS:  Lab Results  Component Value Date   NA 139 08/20/2017   K 4.6 08/20/2017   CL 112 (H) 08/20/2017   CO2 19 (L) 08/20/2017   GLUCOSE 119 (H) 08/20/2017   BUN 50 (H) 08/20/2017   CREATININE 2.44 (H) 08/20/2017   CALCIUM 8.6 (L) 08/20/2017   PROT 5.9 (L) 04/09/2016   ALBUMIN 3.1 (L) 04/09/2016   AST 12 (L) 04/09/2016   ALT 8 (L) 04/09/2016   ALKPHOS 67 04/09/2016   BILITOT 0.5 04/09/2016   GFRNONAA 24 (L) 08/20/2017   GFRAA 28 (L) 08/20/2017    Lab Results  Component Value Date   WBC 5.5 08/20/2017   NEUTROABS 4.2 08/20/2017   HGB 11.4 (L) 08/20/2017   HCT 33.5 (L) 08/20/2017   MCV 97.6  08/20/2017   PLT 272 08/20/2017   Lab Results  Component Value Date   TOTALPROTELP 5.2 (L) 08/20/2017   ALBUMINELP 2.7 (L) 08/20/2017   A1GS 0.3 08/20/2017   A2GS 0.6 08/20/2017   BETS 0.8 08/20/2017   GAMS 0.8 08/20/2017   MSPIKE 0.4 (H) 08/20/2017   SPEI Comment 08/20/2017     STUDIES: Dg Chest 1 View  Result Date: 08/22/2017 CLINICAL DATA:  Status post right thoracentesis EXAM: CHEST  1 VIEW COMPARISON:  08/02/2017 FINDINGS: Cardiac shadow is stable. The lungs are well aerated bilaterally. No evidence of post thoracentesis pneumothorax is noted. Nipple shadows are noted bilaterally. Stable calcifications are noted on the left unchanged from  the previous exam. IMPRESSION: No evidence of pneumothorax following right-sided thoracentesis. Electronically Signed   By: Inez Catalina M.D.   On: 08/22/2017 10:19   Dg Chest Port 1 View  Result Date: 08/02/2017 CLINICAL DATA:  Post right-sided thoracentesis EXAM: PORTABLE CHEST 1 VIEW COMPARISON:  08/01/2017; 04/09/2016; chest CT - 04/09/2016 FINDINGS: Unchanged cardiac silhouette and mediastinal contours. Interval reduction/near resolution residual trace right-sided effusion post thoracentesis. No pneumothorax. Note is made of a trace left-sided pleural effusion. Improved aeration of the lung bases with persistent bibasilar opacities favored to represent atelectasis. Clustered calcifications about the subpleural aspect of the anterior aspect of the left fourth rib are similar to remote chest CT performed 04/2016. No new focal airspace opacities. No evidence of edema. No acute osseus abnormalities. IMPRESSION: Interval reduction/near resolution of right-sided pleural effusion post thoracentesis. No pneumothorax. Electronically Signed   By: Sandi Mariscal M.D.   On: 08/02/2017 10:50   US Thoracentesis Asp Pleural Space W/img Guide  Result Date: 08/22/2017 INDICATION: Right-sided pleural effusion, congestive failure EXAM: ULTRASOUND GUIDED RIGHT  THORACENTESIS MEDICATIONS: None. COMPLICATIONS: None immediate. PROCEDURE: An ultrasound guided thoracentesis was thoroughly discussed with the patient and questions answered. The benefits, risks, alternatives and complications were also discussed. The patient understands and wishes to proceed with the procedure. Written consent was obtained. Ultrasound was performed to localize and mark an adequate pocket of fluid in the right chest. The area was then prepped and draped in the normal sterile fashion. 1% Lidocaine was used for local anesthesia. Under ultrasound guidance a 6 Fr Safe-T-Centesis catheter was introduced. Thoracentesis was performed. The catheter was removed and a dressing applied. FINDINGS: A total of approximately 2.4 L of clear yellow fluid was removed. Samples were sent to the laboratory as requested by the clinical team. IMPRESSION: Successful ultrasound guided right thoracentesis yielding 2.4 L of pleural fluid. Electronically Signed   By: Inez Catalina M.D.   On: 08/22/2017 10:20   US Thoracentesis Asp Pleural Space W/img Guide  Result Date: 08/02/2017 INDICATION: Symptomatic right sided pleural effusion. Please perform ultrasound-guided thoracentesis for diagnostic and therapeutic purposes EXAM: US THORACENTESIS ASP PLEURAL SPACE W/IMG GUIDE COMPARISON:  Chest radiograph - 08/01/2017; 04/09/2016 MEDICATIONS: None. COMPLICATIONS: None immediate. TECHNIQUE: Informed written consent was obtained from the patient after a discussion of the risks, benefits and alternatives to treatment. A timeout was performed prior to the initiation of the procedure. Initial ultrasound scanning demonstrates a large anechoic right-sided pleural effusion. The lower chest was prepped and draped in the usual sterile fashion. 1% lidocaine was used for local anesthesia. An ultrasound image was saved for documentation purposes. An 8 Fr Safe-T-Centesis catheter was introduced. The thoracentesis was performed. The catheter  was removed and a dressing was applied. The patient tolerated the procedure well without immediate post procedural complication. The patient was escorted to have an upright chest radiograph. FINDINGS: A total of approximately 2.5 liters of serous fluid was removed. Requested samples were sent to the laboratory. IMPRESSION: Successful ultrasound-guided right sided thoracentesis yielding 2.5 liters of pleural fluid. Electronically Signed   By: Sandi Mariscal M.D.   On: 08/02/2017 11:11    ASSESSMENT: MGUS  PLAN:    1. MGUS: Patient had a bone marrow biopsy on November 03, 2015 that revealed only a mild increased plasma cell population of approximately 5-10%. Patient was also noted to have cytogenetic abnormality of a monosomy 86 which is an average risk of multiple myeloma.  His most recent M spike on August 20, 2017 remained  unchanged at 0.4.  His kappa and lambda ratio is within normal limits.  He has renal insufficiency as well as mild anemia.  Given his bone marrow results, patient is at mild to moderate risk to progress to multiple myeloma. Will consider a metastatic bone survey in the future if there is suspicion of progression of disease.  Return to clinic in 6 months with repeat laboratory work and further evaluation.   2. Renal insufficiency: Continue monitoring and treatment per nephrology. 3.  Peripheral edema: Continue evaluation and management per primary care.   Patient expressed understanding and was in agreement with this plan. He also understands that He can call clinic at any time with any questions, concerns, or complaints.    Lloyd Huger, MD   09/01/2017 8:20 AM

## 2017-08-26 DIAGNOSIS — I480 Paroxysmal atrial fibrillation: Secondary | ICD-10-CM | POA: Diagnosis not present

## 2017-08-26 LAB — CYTOLOGY - NON PAP

## 2017-08-27 DIAGNOSIS — S76211D Strain of adductor muscle, fascia and tendon of right thigh, subsequent encounter: Secondary | ICD-10-CM | POA: Diagnosis not present

## 2017-08-27 DIAGNOSIS — M25559 Pain in unspecified hip: Secondary | ICD-10-CM | POA: Diagnosis not present

## 2017-08-28 ENCOUNTER — Other Ambulatory Visit: Payer: Self-pay

## 2017-08-28 ENCOUNTER — Inpatient Hospital Stay (HOSPITAL_BASED_OUTPATIENT_CLINIC_OR_DEPARTMENT_OTHER): Payer: PPO | Admitting: Oncology

## 2017-08-28 VITALS — BP 149/80 | HR 68 | Temp 96.1°F | Resp 18 | Wt 193.8 lb

## 2017-08-28 DIAGNOSIS — N289 Disorder of kidney and ureter, unspecified: Secondary | ICD-10-CM

## 2017-08-28 DIAGNOSIS — R609 Edema, unspecified: Secondary | ICD-10-CM | POA: Diagnosis not present

## 2017-08-28 DIAGNOSIS — D649 Anemia, unspecified: Secondary | ICD-10-CM | POA: Diagnosis not present

## 2017-08-28 DIAGNOSIS — D472 Monoclonal gammopathy: Secondary | ICD-10-CM

## 2017-08-28 DIAGNOSIS — I480 Paroxysmal atrial fibrillation: Secondary | ICD-10-CM | POA: Diagnosis not present

## 2017-08-28 DIAGNOSIS — E7800 Pure hypercholesterolemia, unspecified: Secondary | ICD-10-CM | POA: Diagnosis not present

## 2017-08-28 DIAGNOSIS — I129 Hypertensive chronic kidney disease with stage 1 through stage 4 chronic kidney disease, or unspecified chronic kidney disease: Secondary | ICD-10-CM | POA: Diagnosis not present

## 2017-08-28 DIAGNOSIS — N184 Chronic kidney disease, stage 4 (severe): Secondary | ICD-10-CM | POA: Diagnosis not present

## 2017-08-28 DIAGNOSIS — I5022 Chronic systolic (congestive) heart failure: Secondary | ICD-10-CM | POA: Diagnosis not present

## 2017-08-28 NOTE — Progress Notes (Signed)
Here for follow up. Per pt had " fluid removed from around my heart " 2 liter s he stated. X 2 this month.  Unclear of lab results from that. Overall feeling better. Legs still swollen-taking  .

## 2017-08-29 DIAGNOSIS — M25559 Pain in unspecified hip: Secondary | ICD-10-CM | POA: Diagnosis not present

## 2017-08-29 DIAGNOSIS — S76211D Strain of adductor muscle, fascia and tendon of right thigh, subsequent encounter: Secondary | ICD-10-CM | POA: Diagnosis not present

## 2017-09-03 DIAGNOSIS — I5022 Chronic systolic (congestive) heart failure: Secondary | ICD-10-CM | POA: Diagnosis not present

## 2017-09-03 DIAGNOSIS — Z08 Encounter for follow-up examination after completed treatment for malignant neoplasm: Secondary | ICD-10-CM | POA: Diagnosis not present

## 2017-09-03 DIAGNOSIS — D225 Melanocytic nevi of trunk: Secondary | ICD-10-CM | POA: Diagnosis not present

## 2017-09-03 DIAGNOSIS — Z85820 Personal history of malignant melanoma of skin: Secondary | ICD-10-CM | POA: Diagnosis not present

## 2017-09-03 DIAGNOSIS — D2262 Melanocytic nevi of left upper limb, including shoulder: Secondary | ICD-10-CM | POA: Diagnosis not present

## 2017-09-03 DIAGNOSIS — L570 Actinic keratosis: Secondary | ICD-10-CM | POA: Diagnosis not present

## 2017-09-03 DIAGNOSIS — D485 Neoplasm of uncertain behavior of skin: Secondary | ICD-10-CM | POA: Diagnosis not present

## 2017-09-03 DIAGNOSIS — X32XXXA Exposure to sunlight, initial encounter: Secondary | ICD-10-CM | POA: Diagnosis not present

## 2017-09-03 DIAGNOSIS — D2261 Melanocytic nevi of right upper limb, including shoulder: Secondary | ICD-10-CM | POA: Diagnosis not present

## 2017-09-03 DIAGNOSIS — D044 Carcinoma in situ of skin of scalp and neck: Secondary | ICD-10-CM | POA: Diagnosis not present

## 2017-09-04 DIAGNOSIS — S76211D Strain of adductor muscle, fascia and tendon of right thigh, subsequent encounter: Secondary | ICD-10-CM | POA: Diagnosis not present

## 2017-09-04 DIAGNOSIS — M25559 Pain in unspecified hip: Secondary | ICD-10-CM | POA: Diagnosis not present

## 2017-09-05 DIAGNOSIS — R188 Other ascites: Secondary | ICD-10-CM | POA: Diagnosis not present

## 2017-09-05 DIAGNOSIS — I129 Hypertensive chronic kidney disease with stage 1 through stage 4 chronic kidney disease, or unspecified chronic kidney disease: Secondary | ICD-10-CM | POA: Diagnosis not present

## 2017-09-05 DIAGNOSIS — E8770 Fluid overload, unspecified: Secondary | ICD-10-CM | POA: Diagnosis not present

## 2017-09-05 DIAGNOSIS — J90 Pleural effusion, not elsewhere classified: Secondary | ICD-10-CM | POA: Diagnosis not present

## 2017-09-05 DIAGNOSIS — I5022 Chronic systolic (congestive) heart failure: Secondary | ICD-10-CM | POA: Diagnosis not present

## 2017-09-05 DIAGNOSIS — E7800 Pure hypercholesterolemia, unspecified: Secondary | ICD-10-CM | POA: Diagnosis not present

## 2017-09-05 DIAGNOSIS — N184 Chronic kidney disease, stage 4 (severe): Secondary | ICD-10-CM | POA: Diagnosis not present

## 2017-09-05 DIAGNOSIS — Z8709 Personal history of other diseases of the respiratory system: Secondary | ICD-10-CM | POA: Diagnosis not present

## 2017-09-05 DIAGNOSIS — I481 Persistent atrial fibrillation: Secondary | ICD-10-CM | POA: Diagnosis not present

## 2017-09-06 DIAGNOSIS — M25559 Pain in unspecified hip: Secondary | ICD-10-CM | POA: Diagnosis not present

## 2017-09-06 DIAGNOSIS — S76211D Strain of adductor muscle, fascia and tendon of right thigh, subsequent encounter: Secondary | ICD-10-CM | POA: Diagnosis not present

## 2017-09-09 ENCOUNTER — Other Ambulatory Visit: Payer: Self-pay | Admitting: Internal Medicine

## 2017-09-09 DIAGNOSIS — R188 Other ascites: Secondary | ICD-10-CM

## 2017-09-09 DIAGNOSIS — E8770 Fluid overload, unspecified: Secondary | ICD-10-CM

## 2017-09-10 DIAGNOSIS — S76211D Strain of adductor muscle, fascia and tendon of right thigh, subsequent encounter: Secondary | ICD-10-CM | POA: Diagnosis not present

## 2017-09-10 DIAGNOSIS — M25559 Pain in unspecified hip: Secondary | ICD-10-CM | POA: Diagnosis not present

## 2017-09-11 DIAGNOSIS — I481 Persistent atrial fibrillation: Secondary | ICD-10-CM | POA: Diagnosis not present

## 2017-09-11 DIAGNOSIS — I5022 Chronic systolic (congestive) heart failure: Secondary | ICD-10-CM | POA: Diagnosis not present

## 2017-09-11 DIAGNOSIS — N184 Chronic kidney disease, stage 4 (severe): Secondary | ICD-10-CM | POA: Diagnosis not present

## 2017-09-11 DIAGNOSIS — I129 Hypertensive chronic kidney disease with stage 1 through stage 4 chronic kidney disease, or unspecified chronic kidney disease: Secondary | ICD-10-CM | POA: Diagnosis not present

## 2017-09-12 DIAGNOSIS — S76211D Strain of adductor muscle, fascia and tendon of right thigh, subsequent encounter: Secondary | ICD-10-CM | POA: Diagnosis not present

## 2017-09-12 DIAGNOSIS — M25559 Pain in unspecified hip: Secondary | ICD-10-CM | POA: Diagnosis not present

## 2017-09-13 ENCOUNTER — Other Ambulatory Visit: Payer: Self-pay | Admitting: Internal Medicine

## 2017-09-13 DIAGNOSIS — Z8709 Personal history of other diseases of the respiratory system: Secondary | ICD-10-CM

## 2017-09-13 DIAGNOSIS — E8770 Fluid overload, unspecified: Secondary | ICD-10-CM

## 2017-09-16 ENCOUNTER — Ambulatory Visit
Admission: RE | Admit: 2017-09-16 | Discharge: 2017-09-16 | Disposition: A | Discharge status: Home / Self Care | Payer: PPO | Source: Ambulatory Visit | Attending: Internal Medicine | Admitting: Internal Medicine

## 2017-09-16 DIAGNOSIS — I2510 Atherosclerotic heart disease of native coronary artery without angina pectoris: Secondary | ICD-10-CM | POA: Insufficient documentation

## 2017-09-16 DIAGNOSIS — E8770 Fluid overload, unspecified: Secondary | ICD-10-CM | POA: Diagnosis not present

## 2017-09-16 DIAGNOSIS — J90 Pleural effusion, not elsewhere classified: Secondary | ICD-10-CM | POA: Diagnosis not present

## 2017-09-16 DIAGNOSIS — R188 Other ascites: Secondary | ICD-10-CM

## 2017-09-16 DIAGNOSIS — I700 Atherosclerosis of aorta: Secondary | ICD-10-CM | POA: Diagnosis not present

## 2017-09-16 DIAGNOSIS — J9811 Atelectasis: Secondary | ICD-10-CM | POA: Diagnosis not present

## 2017-09-16 DIAGNOSIS — Z8709 Personal history of other diseases of the respiratory system: Secondary | ICD-10-CM

## 2017-09-16 DIAGNOSIS — N200 Calculus of kidney: Secondary | ICD-10-CM | POA: Diagnosis not present

## 2017-09-16 DIAGNOSIS — I358 Other nonrheumatic aortic valve disorders: Secondary | ICD-10-CM | POA: Insufficient documentation

## 2017-09-23 DIAGNOSIS — E7800 Pure hypercholesterolemia, unspecified: Secondary | ICD-10-CM | POA: Diagnosis not present

## 2017-09-23 DIAGNOSIS — I2510 Atherosclerotic heart disease of native coronary artery without angina pectoris: Secondary | ICD-10-CM | POA: Diagnosis not present

## 2017-09-23 DIAGNOSIS — I5022 Chronic systolic (congestive) heart failure: Secondary | ICD-10-CM | POA: Diagnosis not present

## 2017-09-23 DIAGNOSIS — I700 Atherosclerosis of aorta: Secondary | ICD-10-CM | POA: Insufficient documentation

## 2017-09-23 DIAGNOSIS — J90 Pleural effusion, not elsewhere classified: Secondary | ICD-10-CM | POA: Diagnosis not present

## 2017-10-02 DIAGNOSIS — I129 Hypertensive chronic kidney disease with stage 1 through stage 4 chronic kidney disease, or unspecified chronic kidney disease: Secondary | ICD-10-CM | POA: Diagnosis not present

## 2017-10-02 DIAGNOSIS — E7800 Pure hypercholesterolemia, unspecified: Secondary | ICD-10-CM | POA: Diagnosis not present

## 2017-10-02 DIAGNOSIS — I5022 Chronic systolic (congestive) heart failure: Secondary | ICD-10-CM | POA: Diagnosis not present

## 2017-10-02 DIAGNOSIS — N184 Chronic kidney disease, stage 4 (severe): Secondary | ICD-10-CM | POA: Diagnosis not present

## 2017-10-02 DIAGNOSIS — I2510 Atherosclerotic heart disease of native coronary artery without angina pectoris: Secondary | ICD-10-CM | POA: Diagnosis not present

## 2017-10-02 DIAGNOSIS — I700 Atherosclerosis of aorta: Secondary | ICD-10-CM | POA: Diagnosis not present

## 2017-10-02 DIAGNOSIS — I4891 Unspecified atrial fibrillation: Secondary | ICD-10-CM | POA: Diagnosis not present

## 2017-10-02 DIAGNOSIS — Z0000 Encounter for general adult medical examination without abnormal findings: Secondary | ICD-10-CM | POA: Diagnosis not present

## 2017-10-06 LAB — ACID FAST CULTURE WITH REFLEXED SENSITIVITIES (MYCOBACTERIA)

## 2017-10-06 LAB — ACID FAST CULTURE WITH REFLEXED SENSITIVITIES: ACID FAST CULTURE - AFSCU3: NEGATIVE

## 2017-10-09 DIAGNOSIS — D044 Carcinoma in situ of skin of scalp and neck: Secondary | ICD-10-CM | POA: Diagnosis not present

## 2017-10-21 DIAGNOSIS — I2510 Atherosclerotic heart disease of native coronary artery without angina pectoris: Secondary | ICD-10-CM | POA: Diagnosis not present

## 2017-10-21 DIAGNOSIS — I700 Atherosclerosis of aorta: Secondary | ICD-10-CM | POA: Diagnosis not present

## 2017-10-21 DIAGNOSIS — R001 Bradycardia, unspecified: Secondary | ICD-10-CM | POA: Diagnosis not present

## 2017-10-21 DIAGNOSIS — E7800 Pure hypercholesterolemia, unspecified: Secondary | ICD-10-CM | POA: Diagnosis not present

## 2017-10-21 DIAGNOSIS — I129 Hypertensive chronic kidney disease with stage 1 through stage 4 chronic kidney disease, or unspecified chronic kidney disease: Secondary | ICD-10-CM | POA: Diagnosis not present

## 2017-10-21 DIAGNOSIS — J90 Pleural effusion, not elsewhere classified: Secondary | ICD-10-CM | POA: Diagnosis not present

## 2017-10-21 DIAGNOSIS — I4891 Unspecified atrial fibrillation: Secondary | ICD-10-CM | POA: Diagnosis not present

## 2017-10-21 DIAGNOSIS — I5022 Chronic systolic (congestive) heart failure: Secondary | ICD-10-CM | POA: Diagnosis not present

## 2017-10-21 DIAGNOSIS — N184 Chronic kidney disease, stage 4 (severe): Secondary | ICD-10-CM | POA: Diagnosis not present

## 2017-10-23 DIAGNOSIS — N401 Enlarged prostate with lower urinary tract symptoms: Secondary | ICD-10-CM | POA: Diagnosis not present

## 2017-10-23 DIAGNOSIS — N184 Chronic kidney disease, stage 4 (severe): Secondary | ICD-10-CM | POA: Diagnosis not present

## 2017-10-23 DIAGNOSIS — I129 Hypertensive chronic kidney disease with stage 1 through stage 4 chronic kidney disease, or unspecified chronic kidney disease: Secondary | ICD-10-CM | POA: Diagnosis not present

## 2017-10-23 DIAGNOSIS — D472 Monoclonal gammopathy: Secondary | ICD-10-CM | POA: Diagnosis not present

## 2017-10-23 DIAGNOSIS — R809 Proteinuria, unspecified: Secondary | ICD-10-CM | POA: Diagnosis not present

## 2017-12-10 DIAGNOSIS — L821 Other seborrheic keratosis: Secondary | ICD-10-CM | POA: Diagnosis not present

## 2018-01-07 DIAGNOSIS — D3131 Benign neoplasm of right choroid: Secondary | ICD-10-CM | POA: Diagnosis not present

## 2018-01-07 DIAGNOSIS — H353131 Nonexudative age-related macular degeneration, bilateral, early dry stage: Secondary | ICD-10-CM | POA: Diagnosis not present

## 2018-01-07 DIAGNOSIS — H35039 Hypertensive retinopathy, unspecified eye: Secondary | ICD-10-CM | POA: Diagnosis not present

## 2018-01-07 DIAGNOSIS — H2513 Age-related nuclear cataract, bilateral: Secondary | ICD-10-CM | POA: Diagnosis not present

## 2018-01-13 ENCOUNTER — Other Ambulatory Visit: Payer: Self-pay | Admitting: Urology

## 2018-01-22 NOTE — Progress Notes (Signed)
01/24/2018 8:40 AM   Lawrence Perkins. 03/20/1940 [id number]  Referring provider: Kirk Ruths, MD [facility] [place], [place] [address]  Chief Complaint  Patient presents with  . Benign Prostatic Hypertrophy    HPI: Lawrence Perkins. is a 78 y.o. male Caucasian with BPH with LU TS who presents today for an annual exam.  Patient is a former patient of [name] who transferred his care to here in 2019.  On his first visit here on 01/22/2017, his IPSS was 3/2 which was an improvement over his prior one of 8/2.  His major complaints that visit were frequency and nocturia, which he had for several years.  He denied any dysuria, hematuria, and suprapubic pain, as well as any recent fevers, chills, nausea or vomiting.  BPH WITH LUTS  (prostate and/or bladder) IPSS score: 7/2  Previous score: 3/2  Major complaint(s): Frequency and nocturia x several years.  Denies any dysuria, hematuria or suprapubic pain.   Currently taking: tamsulosin 0.4 mg daily.  His has had congestive heart failure recently but says that he's actually not urinating any more than before despite being on many water pills.  Does admit to a slow stream; he is not bothered, denies straining but does need to sit.  Patient does not wish to treat it.  Patient does not think he has sleep apnea, does not wish to get tested at this time.  Denies any recent fevers, chills, nausea or vomiting.  He does not have a family history of PCa.  IPSS    Row Name 01/24/18 0800         International Prostate Symptom Score   How often have you had the sensation of not emptying your bladder?  Not at All     How often have you had to urinate less than every two hours?  About half the time     How often have you found you stopped and started again several times when you urinated?  Not at All     How often have you found it difficult to postpone urination?  Not at All     How often have you  had a weak urinary stream?  Less than half the time     How often have you had to strain to start urination?  Not at All     How many times did you typically get up at night to urinate?  2 Times     Total IPSS Score  7       Quality of Life due to urinary symptoms   If you were to spend the rest of your life with your urinary condition just the way it is now how would you feel about that?  Mostly Satisfied        Score:  1-7 Mild 8-19 Moderate 20-35 Severe  PMH: Past Medical History:  Diagnosis Date  . BPH (benign prostatic hyperplasia)   . Cardiomyopathy ([place])   . CHF (congestive heart failure) ([place])   . Chronic kidney disease   . Complication of anesthesia    has converted to afib during and/or after surgery in the past  . Diverticulosis   . Dysrhythmia    afib  . Gout   . History of colon polyps   . Hypercholesteremia   . Hypertension   . Melanoma ([place])    status post resection from his back  . MGUS (monoclonal gammopathy of  unknown significance)   . Pleural effusion   . Prostatism   . Rectal bleeding     Surgical History: Past Surgical History:  Procedure Laterality Date  . ANKLE SURGERY     following volleyball injury  . APPENDECTOMY    . COLONOSCOPY    . COLONOSCOPY WITH PROPOFOL N/A 01/02/2017   Procedure: COLONOSCOPY WITH PROPOFOL;  Surgeon: Manya Silvas, MD;  Location: [facility] ENDOSCOPY;  Service: Endoscopy;  Laterality: N/A;  . JOINT REPLACEMENT     RIGHT TOTAL HIP  . TONSILLECTOMY    . TOTAL HIP ARTHROPLASTY  09/2009   Right femoral neck fracture  . TOTAL HIP ARTHROPLASTY Left 03/06/2016   Procedure: TOTAL HIP ARTHROPLASTY ANTERIOR APPROACH;  Surgeon: Hessie Knows, MD;  Location: [facility] ORS;  Service: Orthopedics;  Laterality: Left;    Home Medications:  Allergies as of 01/24/2018   No Known Allergies     Medication List       Accurate as of January 24, 2018  8:40 AM. Always use your most recent med list.        amLODipine 10 MG  tablet Commonly known as:  NORVASC Take 10 mg by mouth daily.   carvedilol 6.25 MG tablet Commonly known as:  COREG Take 6.25 mg by mouth 2 (two) times daily with a meal.   dapagliflozin propanediol 10 MG Tabs tablet Commonly known as:  FARXIGA Take 10 mg by mouth daily. Patient on trial   ELIQUIS 5 MG Tabs tablet Generic drug:  apixaban Take 5 mg by mouth 2 (two) times daily.   enalapril 5 MG tablet Commonly known as:  VASOTEC Take 5 mg by mouth daily.   rosuvastatin 10 MG tablet Commonly known as:  CRESTOR Take 10 mg by mouth daily.   tamsulosin 0.4 MG Caps capsule Commonly known as:  FLOMAX Take 1 capsule (0.4 mg total) by mouth daily.   Vitamin D3 50 MCG (2000 UT) capsule Take by mouth.       Allergies: No Known Allergies  Family History: Family History  Problem Relation Age of Onset  . Cirrhosis Mother   . Other Father        Lung Fibrosis  . Prostate cancer Neg Hx   . Kidney cancer Neg Hx   . Bladder Cancer Neg Hx     Social History:  reports that he quit smoking about 37 years ago. He quit after 15.00 years of use. He has never used smokeless tobacco. He reports current alcohol use of about 2.0 standard drinks of alcohol per week. He reports that he does not use drugs.  ROS: UROLOGY Frequent Urination?: Yes Hard to postpone urination?: No Burning/pain with urination?: No Get up at night to urinate?: Yes Leakage of urine?: No Urine stream starts and stops?: No Trouble starting stream?: No Do you have to strain to urinate?: No Blood in urine?: No Urinary tract infection?: No Sexually transmitted disease?: No Injury to kidneys or bladder?: No Painful intercourse?: No Weak stream?: Yes Erection problems?: No Penile pain?: No  Gastrointestinal Nausea?: No Vomiting?: No Indigestion/heartburn?: No Diarrhea?: No Constipation?: No  Constitutional Fever: No Night sweats?: No Weight loss?: No Fatigue?: No  Skin Skin rash/lesions?:  No Itching?: Yes  Eyes Blurred vision?: No Double vision?: No  Ears/Nose/Throat Sore throat?: No Sinus problems?: No  Hematologic/Lymphatic Swollen glands?: No Easy bruising?: No  Cardiovascular Leg swelling?: No Chest pain?: No  Respiratory Cough?: No Shortness of breath?: Yes  Endocrine Excessive thirst?: No  Musculoskeletal Back  pain?: No Joint pain?: No  Neurological Headaches?: No Dizziness?: No  Psychologic Depression?: No Anxiety?: No  Physical Exam: BP 108/66   Pulse 70   Ht 5\' 11"  (1.803 m)   Wt 183 lb (83 kg)   BMI 25.52 kg/m   Constitutional:  Well nourished. Alert and oriented, No acute distress. Cardiovascular: No clubbing, cyanosis, or edema. Respiratory: Normal respiratory effort, no increased work of breathing. GU: No CVA tenderness.  No bladder fullness or masses.  Patient with circumcised phallus.  Urethral meatus is patent.  No penile discharge. No penile lesions or rashes. Scrotum without lesions, cysts, rashes and/or edema.  Testicles are located scrotally bilaterally. No masses are appreciated in the testicles. Left and right epididymis are normal. Rectal: Patient with normal sphincter tone. Anus and perineum without scarring or rashes. No rectal masses are appreciated. Prostate is approximately 55 grams, no nodules are appreciated.  Only apex and midportion of the prostate were palpated; seminal vesicles could not be palpated.. Skin: No rashes, bruises or suspicious lesions. Neurologic: Grossly intact, no focal deficits, moving all 4 extremities. Psychiatric: Normal mood and affect.  Laboratory Data: Lab Results  Component Value Date   WBC 5.5 08/20/2017   HGB 11.4 (L) 08/20/2017   HCT 33.5 (L) 08/20/2017   MCV 97.6 08/20/2017   PLT 272 08/20/2017    Lab Results  Component Value Date   CREATININE 2.44 (H) 08/20/2017   I have reviewed the labs.  Assessment & Plan:    1. BPH with LUTS - IPSS score is 7/2, it is worsened -  Continue conservative management, avoiding bladder irritants and timed voiding's - Continue tamsulosin 0.4 mg daily; refills given - RTC in 12 months for IPSS and exam  Return in about 1 year (around 01/25/2019) for I PSS and exam .  These notes generated with voice recognition software. I apologize for typographical errors.  [facility] [address] [place], [place] [address] [phone]  I, Adele Schilder, am acting as a Education administrator for [employer], PA-C.   I have reviewed the above documentation for accuracy and completeness, and I agree with the above.    Zara Council, PA-C

## 2018-01-24 ENCOUNTER — Encounter: Payer: Self-pay | Admitting: Urology

## 2018-01-24 ENCOUNTER — Ambulatory Visit: Payer: PPO | Admitting: Urology

## 2018-01-24 VITALS — BP 108/66 | HR 70 | Ht 71.0 in | Wt 183.0 lb

## 2018-01-24 DIAGNOSIS — N401 Enlarged prostate with lower urinary tract symptoms: Secondary | ICD-10-CM | POA: Diagnosis not present

## 2018-01-24 DIAGNOSIS — N138 Other obstructive and reflux uropathy: Secondary | ICD-10-CM

## 2018-01-24 MED ORDER — TAMSULOSIN HCL 0.4 MG PO CAPS: 0.4000 mg | ORAL_CAPSULE | Freq: Every day | ORAL | 3 refills | Status: DC

## 2018-02-16 ENCOUNTER — Other Ambulatory Visit: Payer: Self-pay

## 2018-02-16 ENCOUNTER — Inpatient Hospital Stay
Admission: EM | Admit: 2018-02-16 | Discharge: 2018-02-18 | DRG: 683 | Disposition: A | Discharge status: Home / Self Care | Payer: PPO | Attending: Internal Medicine | Admitting: Internal Medicine

## 2018-02-16 ENCOUNTER — Emergency Department: Payer: PPO

## 2018-02-16 DIAGNOSIS — I130 Hypertensive heart and chronic kidney disease with heart failure and stage 1 through stage 4 chronic kidney disease, or unspecified chronic kidney disease: Secondary | ICD-10-CM | POA: Diagnosis present

## 2018-02-16 DIAGNOSIS — Z7901 Long term (current) use of anticoagulants: Secondary | ICD-10-CM

## 2018-02-16 DIAGNOSIS — E860 Dehydration: Secondary | ICD-10-CM | POA: Diagnosis present

## 2018-02-16 DIAGNOSIS — E785 Hyperlipidemia, unspecified: Secondary | ICD-10-CM | POA: Diagnosis present

## 2018-02-16 DIAGNOSIS — I5032 Chronic diastolic (congestive) heart failure: Secondary | ICD-10-CM | POA: Diagnosis not present

## 2018-02-16 DIAGNOSIS — I2510 Atherosclerotic heart disease of native coronary artery without angina pectoris: Secondary | ICD-10-CM | POA: Diagnosis present

## 2018-02-16 DIAGNOSIS — N138 Other obstructive and reflux uropathy: Secondary | ICD-10-CM | POA: Diagnosis not present

## 2018-02-16 DIAGNOSIS — N184 Chronic kidney disease, stage 4 (severe): Secondary | ICD-10-CM | POA: Diagnosis not present

## 2018-02-16 DIAGNOSIS — R55 Syncope and collapse: Secondary | ICD-10-CM | POA: Diagnosis present

## 2018-02-16 DIAGNOSIS — D472 Monoclonal gammopathy: Secondary | ICD-10-CM | POA: Diagnosis not present

## 2018-02-16 DIAGNOSIS — I4891 Unspecified atrial fibrillation: Secondary | ICD-10-CM

## 2018-02-16 DIAGNOSIS — Z87891 Personal history of nicotine dependence: Secondary | ICD-10-CM | POA: Diagnosis not present

## 2018-02-16 DIAGNOSIS — I482 Chronic atrial fibrillation, unspecified: Secondary | ICD-10-CM | POA: Diagnosis not present

## 2018-02-16 DIAGNOSIS — E1122 Type 2 diabetes mellitus with diabetic chronic kidney disease: Secondary | ICD-10-CM | POA: Diagnosis not present

## 2018-02-16 DIAGNOSIS — I10 Essential (primary) hypertension: Secondary | ICD-10-CM | POA: Diagnosis not present

## 2018-02-16 DIAGNOSIS — E1165 Type 2 diabetes mellitus with hyperglycemia: Secondary | ICD-10-CM | POA: Diagnosis not present

## 2018-02-16 DIAGNOSIS — Z79899 Other long term (current) drug therapy: Secondary | ICD-10-CM | POA: Diagnosis not present

## 2018-02-16 DIAGNOSIS — N401 Enlarged prostate with lower urinary tract symptoms: Secondary | ICD-10-CM | POA: Diagnosis not present

## 2018-02-16 DIAGNOSIS — N179 Acute kidney failure, unspecified: Principal | ICD-10-CM | POA: Diagnosis present

## 2018-02-16 DIAGNOSIS — I700 Atherosclerosis of aorta: Secondary | ICD-10-CM | POA: Diagnosis present

## 2018-02-16 DIAGNOSIS — D631 Anemia in chronic kidney disease: Secondary | ICD-10-CM | POA: Diagnosis not present

## 2018-02-16 DIAGNOSIS — N19 Unspecified kidney failure: Secondary | ICD-10-CM | POA: Diagnosis not present

## 2018-02-16 LAB — CBC
HCT: 33.5 % — ABNORMAL LOW (ref 39.0–52.0)
Hemoglobin: 11.2 g/dL — ABNORMAL LOW (ref 13.0–17.0)
MCH: 32.8 pg (ref 26.0–34.0)
MCHC: 33.4 g/dL (ref 30.0–36.0)
MCV: 98.2 fL (ref 80.0–100.0)
Platelets: 200 10*3/uL (ref 150–400)
RBC: 3.41 MIL/uL — AB (ref 4.22–5.81)
RDW: 12.9 % (ref 11.5–15.5)
WBC: 6.6 10*3/uL (ref 4.0–10.5)
nRBC: 0 % (ref 0.0–0.2)

## 2018-02-16 LAB — BASIC METABOLIC PANEL
Anion gap: 10 (ref 5–15)
BUN: 95 mg/dL — ABNORMAL HIGH (ref 8–23)
CO2: 25 mmol/L (ref 22–32)
Calcium: 9.2 mg/dL (ref 8.9–10.3)
Chloride: 103 mmol/L (ref 98–111)
Creatinine, Ser: 3.56 mg/dL — ABNORMAL HIGH (ref 0.61–1.24)
GFR calc Af Amer: 18 mL/min — ABNORMAL LOW (ref >60)
GFR calc non Af Amer: 15 mL/min — ABNORMAL LOW (ref >60)
GLUCOSE: 113 mg/dL — AB (ref 70–99)
Potassium: 5 mmol/L (ref 3.5–5.1)
Sodium: 138 mmol/L (ref 135–145)

## 2018-02-16 LAB — TROPONIN I: Troponin I: <0.03 ng/mL (ref <0.03)

## 2018-02-16 MED ORDER — SODIUM CHLORIDE 0.9 % IV BOLUS
250.0000 mL | Freq: Once | INTRAVENOUS | Status: AC
  Administered 2018-02-16: 250 mL via INTRAVENOUS

## 2018-02-16 NOTE — ED Notes (Signed)
In to meet patient; voices need to void and possibly have a bowel movement; walked with pt to toilet in room, pt had steady gait and did not need assistance; to pull red cord when finished;

## 2018-02-16 NOTE — ED Triage Notes (Signed)
Patient at funeral and had episode of unresponsiveness.  By stander checked pulse and it was in the 30's.

## 2018-02-16 NOTE — ED Provider Notes (Addendum)
[facility] Emergency Department Provider Note    First MD Initiated Contact with Patient 02/16/18 2055     (approximate)  I have reviewed the triage vital signs and the nursing notes.   HISTORY  Chief Complaint Near Syncope    HPI Lawrence Perkins. is a 78 y.o. male presents the ER for evaluation of syncopal episode that occurred while he was at awake today.  Visiting fever started feeling hot week and like he was about to pass out.  Started sweating quite a bit.  Did not fall and hit his head.  States he otherwise feels well at this point.  Has been heavily diuresed over the past several weeks for history of congestive heart failure.  Assess follow-up with cardiology tomorrow morning.  Denies any chest pain at this time.    Past Medical History:  Diagnosis Date  . BPH (benign prostatic hyperplasia)   . Cardiomyopathy ([place])   . CHF (congestive heart failure) ([place])   . Chronic kidney disease   . Complication of anesthesia    has converted to afib during and/or after surgery in the past  . Diverticulosis   . Dysrhythmia    afib  . Gout   . History of colon polyps   . Hypercholesteremia   . Hypertension   . 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 MGUS (monoclonal gammopathy of unknown significance)   . Pleural effusion   . Prostatism   . Rectal bleeding    Family History  Problem Relation Age of Onset  . Cirrhosis Mother   . Other Father        Lung Fibrosis  . Prostate cancer Neg Hx   . Kidney cancer Neg Hx   . Bladder Cancer Neg Hx    Past Surgical History:  Procedure Laterality Date  . ANKLE SURGERY     following volleyball injury  . APPENDECTOMY    . COLONOSCOPY    . COLONOSCOPY WITH PROPOFOL N/A 01/02/2017   Procedure: COLONOSCOPY WITH PROPOFOL;  Surgeon: Manya Silvas, MD;  Location: [facility] ENDOSCOPY;  Service: Endoscopy;  Laterality: N/A;  . JOINT REPLACEMENT     RIGHT TOTAL HIP  . TONSILLECTOMY    . TOTAL  HIP ARTHROPLASTY  09/2009   Right femoral neck fracture  . TOTAL HIP ARTHROPLASTY Left 03/06/2016   Procedure: TOTAL HIP ARTHROPLASTY ANTERIOR APPROACH;  Surgeon: Hessie Knows, MD;  Location: [facility] ORS;  Service: Orthopedics;  Laterality: Left;   Patient Active Problem List   Diagnosis Date Noted  . Atherosclerosis of abdominal aorta ([name]) 09/23/2017  . Coronary artery disease involving native coronary artery of native heart 09/23/2017  . Pleural effusion, not elsewhere classified 08/07/2017  . Hx of adenomatous polyp of colon 10/01/2016  . Bradycardia 06/20/2016  . Chronic systolic CHF (congestive heart failure), NYHA class 2 ([place]) 04/11/2016  . Benign hypertension with chronic kidney disease, stage III ([name]) 04/11/2016  . Primary osteoarthritis of left hip 03/06/2016  . MGUS (monoclonal gammopathy of unknown significance) 10/31/2015  . Health care maintenance 11/15/2014  . Chronic kidney disease, stage III (moderate) ([name]) 06/27/2013  . Hyperlipidemia 06/27/2013  . ATRIAL FIBRILLATION 02/14/2010  . 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 Authorizing Provider  amLODipine (NORVASC) 10 MG tablet Take 10 mg by mouth daily. 01/20/18  Yes [provider]  apixaban Arne [place]) 5  MG TABS tablet Take 5 mg by mouth 2 (two) times daily.   Yes [provider]  carvedilol (COREG) 6.25 MG tablet Take 6.25 mg by mouth 2 (two) times daily with a meal.   Yes [provider]  Cholecalciferol (VITAMIN D3) 2000 units capsule Take by mouth.   Yes [provider]  dapagliflozin propanediol (FARXIGA) 10 MG TABS tablet Take 10 mg by mouth daily. Patient on trial   Yes [provider]  enalapril (VASOTEC) 5 MG tablet Take 5 mg by mouth daily.    Yes [provider]  rosuvastatin (CRESTOR) 10 MG tablet Take 10 mg by mouth daily. 09/23/17  Yes [provider]  tamsulosin  (FLOMAX) 0.4 MG CAPS capsule Take 1 capsule (0.4 mg total) by mouth daily. 01/24/18  Yes McGowan, Larene Beach A, PA-C    Allergies Patient has no known allergies.    Social History Social History   Tobacco Use  . Smoking status: Former Smoker    Years: 15.00    Last attempt to quit: 01/01/1981    Years since quitting: 37.1  . Smokeless tobacco: Never Used  Substance Use Topics  . Alcohol use: Yes    Alcohol/week: 2.0 standard drinks    Types: 2 Cans of beer per week  . Drug use: No    Review of Systems Patient denies headaches, rhinorrhea, blurry vision, numbness, shortness of breath, chest pain, edema, cough, abdominal pain, nausea, vomiting, diarrhea, dysuria, fevers, rashes or hallucinations unless otherwise stated above in HPI. ____________________________________________   PHYSICAL EXAM:  VITAL SIGNS: Vitals:   02/16/18 2200 02/16/18 2230  BP: 131/75 (!) 144/74  Pulse: (!) 56 63  Resp: 13 13  SpO2: 100% 98%    Constitutional: Alert and oriented.  Eyes: Conjunctivae are normal.  Head: Atraumatic. Nose: No congestion/rhinnorhea. Mouth/Throat: Mucous membranes are moist.   Neck: No stridor. Painless ROM.  Cardiovascular: Normal rate, regular rhythm. Grossly normal heart sounds.  Good peripheral circulation. Respiratory: Normal respiratory effort.  No retractions. Lungs CTAB. Gastrointestinal: Soft and nontender. No distention. No abdominal bruits. No CVA tenderness. Genitourinary:  Musculoskeletal: No lower extremity tenderness nor edema.  No joint effusions. Neurologic:  Normal speech and language. No gross focal neurologic deficits are appreciated. No facial droop Skin:  Skin is warm, dry and intact. No rash noted. Psychiatric: Mood and affect are normal. Speech and behavior are normal.  ____________________________________________   LABS (all labs ordered are listed, but only abnormal results are displayed)  Results for orders placed or performed during the  hospital encounter of 02/16/18 (from the past 24 hour(s))  Basic metabolic panel     Status: Abnormal   Collection Time: 02/16/18  8:59 PM  Result Value Ref Range   Sodium 138 135 - 145 mmol/L   Potassium 5.0 3.5 - 5.1 mmol/L   Chloride 103 98 - 111 mmol/L   CO2 25 22 - 32 mmol/L   Glucose, Bld 113 (H) 70 - 99 mg/dL   BUN 95 (H) 8 - 23 mg/dL   Creatinine, Ser 3.56 (H) 0.61 - 1.24 mg/dL   Calcium 9.2 8.9 - 10.3 mg/dL   GFR calc non Af Amer 15 (L) >60 mL/min   GFR calc Af Amer 18 (L) >60 mL/min   Anion gap 10 5 - 15  CBC     Status: Abnormal   Collection Time: 02/16/18  8:59 PM  Result Value Ref Range   WBC 6.6 4.0 - 10.5 K/uL   RBC 3.41 (L)  4.22 - 5.81 MIL/uL   Hemoglobin 11.2 (L) 13.0 - 17.0 g/dL   HCT 33.5 (L) 39.0 - 52.0 %   MCV 98.2 80.0 - 100.0 fL   MCH 32.8 26.0 - 34.0 pg   MCHC 33.4 30.0 - 36.0 g/dL   RDW 12.9 11.5 - 15.5 %   Platelets 200 150 - 400 K/uL   nRBC 0.0 0.0 - 0.2 %  Troponin I - Add-On to previous collection     Status: None   Collection Time: 02/16/18  8:59 PM  Result Value Ref Range   Troponin I <0.03 <0.03 ng/mL   ____________________________________________  EKG My review and personal interpretation at Time: 20:44   Indication: syncope  Rate: 45  Rhythm: slow afib Axis: normal Other: nonspecific st abn, no stemi ____________________________________________  RADIOLOGY  I personally reviewed all radiographic images ordered to evaluate for the above acute complaints and reviewed radiology reports and findings.  These findings were personally discussed with the patient.  Please see medical record for radiology report.  ____________________________________________   PROCEDURES  Procedure(s) performed:  .Critical Care Performed by: Merlyn Lot, MD Authorized by: Merlyn Lot, MD   Critical care provider statement:    Critical care time (minutes):  30   Critical care was necessary to treat or prevent imminent or life-threatening  deterioration of the following conditions:  Renal failure   Critical care was time spent personally by me on the following activities:  Discussions with consultants, evaluation of patient's response to treatment, examination of patient, ordering and performing treatments and interventions, ordering and review of laboratory studies, ordering and review of radiographic studies, pulse oximetry, re-evaluation of patient's condition, obtaining history from patient or surrogate and review of old charts      Critical Care performed: no ____________________________________________   INITIAL IMPRESSION / ASSESSMENT AND PLAN / ED COURSE  Pertinent labs & imaging results that were available during my care of the patient were reviewed by me and considered in my medical decision making (see chart for details).   DDX: aki, dysrhythmia, dehydration, acs, chf, medication effect  Yeshua Stryker. is a 78 y.o. who presents to the ED with symptoms as described above.  Patient afebrile but with intermittent slow A. fib.  Blood work does show evidence of AKI with significantly elevated BUN as well as creatinine.  Currently is hemodynamically stable.  No signs or symptoms of GI bleed.  No evidence of acute ischemia.  Do suspect dehydration with over diuresis given his heart failure.  Based on his intermittent episodes of bradycardia I do believe patient would benefit from hospitalization for syncopal evaluation.      As part of my medical decision making, I reviewed the following data within the [facility] notes reviewed and incorporated, Labs reviewed, notes from prior ED visits and [place] Controlled Substance Database   ____________________________________________   FINAL CLINICAL IMPRESSION(S) / ED DIAGNOSES  Final diagnoses:  Syncope and collapse  Dehydration  AKI (acute kidney injury) ([place])  Atrial fibrillation with slow ventricular response ([place])      NEW MEDICATIONS  STARTED DURING THIS VISIT:  New Prescriptions   No medications on file     Note:  This document was prepared using Dragon voice recognition software and may include unintentional dictation errors.    Merlyn Lot, MD 02/16/18 2310    Merlyn Lot, MD 02/16/18 [phone]

## 2018-02-17 ENCOUNTER — Encounter: Payer: Self-pay | Admitting: Internal Medicine

## 2018-02-17 ENCOUNTER — Inpatient Hospital Stay: Payer: PPO

## 2018-02-17 DIAGNOSIS — R55 Syncope and collapse: Secondary | ICD-10-CM | POA: Diagnosis present

## 2018-02-17 DIAGNOSIS — E785 Hyperlipidemia, unspecified: Secondary | ICD-10-CM | POA: Diagnosis present

## 2018-02-17 DIAGNOSIS — Z79899 Other long term (current) drug therapy: Secondary | ICD-10-CM | POA: Diagnosis not present

## 2018-02-17 DIAGNOSIS — D631 Anemia in chronic kidney disease: Secondary | ICD-10-CM | POA: Diagnosis present

## 2018-02-17 DIAGNOSIS — Z7901 Long term (current) use of anticoagulants: Secondary | ICD-10-CM | POA: Diagnosis not present

## 2018-02-17 DIAGNOSIS — D472 Monoclonal gammopathy: Secondary | ICD-10-CM | POA: Diagnosis present

## 2018-02-17 DIAGNOSIS — I130 Hypertensive heart and chronic kidney disease with heart failure and stage 1 through stage 4 chronic kidney disease, or unspecified chronic kidney disease: Secondary | ICD-10-CM | POA: Diagnosis present

## 2018-02-17 DIAGNOSIS — I2510 Atherosclerotic heart disease of native coronary artery without angina pectoris: Secondary | ICD-10-CM | POA: Diagnosis present

## 2018-02-17 DIAGNOSIS — I482 Chronic atrial fibrillation, unspecified: Secondary | ICD-10-CM | POA: Diagnosis present

## 2018-02-17 DIAGNOSIS — E1165 Type 2 diabetes mellitus with hyperglycemia: Secondary | ICD-10-CM | POA: Diagnosis present

## 2018-02-17 DIAGNOSIS — E1122 Type 2 diabetes mellitus with diabetic chronic kidney disease: Secondary | ICD-10-CM | POA: Diagnosis present

## 2018-02-17 DIAGNOSIS — N184 Chronic kidney disease, stage 4 (severe): Secondary | ICD-10-CM | POA: Diagnosis present

## 2018-02-17 DIAGNOSIS — E860 Dehydration: Secondary | ICD-10-CM | POA: Diagnosis present

## 2018-02-17 DIAGNOSIS — N401 Enlarged prostate with lower urinary tract symptoms: Secondary | ICD-10-CM | POA: Diagnosis present

## 2018-02-17 DIAGNOSIS — I700 Atherosclerosis of aorta: Secondary | ICD-10-CM | POA: Diagnosis present

## 2018-02-17 DIAGNOSIS — N179 Acute kidney failure, unspecified: Secondary | ICD-10-CM | POA: Diagnosis present

## 2018-02-17 DIAGNOSIS — I5032 Chronic diastolic (congestive) heart failure: Secondary | ICD-10-CM | POA: Diagnosis present

## 2018-02-17 DIAGNOSIS — N138 Other obstructive and reflux uropathy: Secondary | ICD-10-CM | POA: Diagnosis present

## 2018-02-17 DIAGNOSIS — Z87891 Personal history of nicotine dependence: Secondary | ICD-10-CM | POA: Diagnosis not present

## 2018-02-17 LAB — URINALYSIS, COMPLETE (UACMP) WITH MICROSCOPIC
Bacteria, UA: NONE SEEN
Bilirubin Urine: NEGATIVE
Glucose, UA: NEGATIVE mg/dL
Ketones, ur: NEGATIVE mg/dL
Leukocytes,Ua: NEGATIVE
Nitrite: NEGATIVE
Protein, ur: 100 mg/dL — AB
Specific Gravity, Urine: 1.009 (ref 1.005–1.030)
Squamous Epithelial / HPF: NONE SEEN (ref 0–5)
pH: 6 (ref 5.0–8.0)

## 2018-02-17 LAB — HEPATIC FUNCTION PANEL
ALT: 11 U/L (ref 0–44)
AST: 12 U/L — ABNORMAL LOW (ref 15–41)
Albumin: 4.1 g/dL (ref 3.5–5.0)
Alkaline Phosphatase: 57 U/L (ref 38–126)
Bilirubin, Direct: <0.1 mg/dL (ref 0.0–0.2)
Total Bilirubin: 0.9 mg/dL (ref 0.3–1.2)
Total Protein: 7.3 g/dL (ref 6.5–8.1)

## 2018-02-17 LAB — BASIC METABOLIC PANEL
ANION GAP: 8 (ref 5–15)
BUN: 83 mg/dL — ABNORMAL HIGH (ref 8–23)
CO2: 24 mmol/L (ref 22–32)
Calcium: 9 mg/dL (ref 8.9–10.3)
Chloride: 106 mmol/L (ref 98–111)
Creatinine, Ser: 3.3 mg/dL — ABNORMAL HIGH (ref 0.61–1.24)
GFR calc Af Amer: 20 mL/min — ABNORMAL LOW (ref >60)
GFR calc non Af Amer: 17 mL/min — ABNORMAL LOW (ref >60)
Glucose, Bld: 97 mg/dL (ref 70–99)
Potassium: 4.7 mmol/L (ref 3.5–5.1)
Sodium: 138 mmol/L (ref 135–145)

## 2018-02-17 LAB — NA AND K (SODIUM & POTASSIUM), RAND UR
Potassium Urine: 32 mmol/L
Sodium, Ur: 62 mmol/L

## 2018-02-17 LAB — PHOSPHORUS: Phosphorus: 4.9 mg/dL — ABNORMAL HIGH (ref 2.5–4.6)

## 2018-02-17 LAB — PROTEIN, URINE, RANDOM: Total Protein, Urine: 90 mg/dL

## 2018-02-17 LAB — TROPONIN I: Troponin I: <0.03 ng/mL (ref <0.03)

## 2018-02-17 LAB — CREATININE, URINE, RANDOM: CREATININE, URINE: 56 mg/dL

## 2018-02-17 LAB — MAGNESIUM: Magnesium: 2.2 mg/dL (ref 1.7–2.4)

## 2018-02-17 LAB — CK: CK TOTAL: 42 U/L — AB (ref 49–397)

## 2018-02-17 LAB — GLUCOSE, CAPILLARY: Glucose-Capillary: 89 mg/dL (ref 70–99)

## 2018-02-17 MED ORDER — APIXABAN 5 MG PO TABS
5.0000 mg | ORAL_TABLET | Freq: Two times a day (BID) | ORAL | Status: DC
  Administered 2018-02-17 – 2018-02-18 (×4): 5 mg via ORAL
  Filled 2018-02-17 (×4): qty 1

## 2018-02-17 MED ORDER — TAMSULOSIN HCL 0.4 MG PO CAPS
0.4000 mg | ORAL_CAPSULE | Freq: Every day | ORAL | Status: DC
  Filled 2018-02-17: qty 1

## 2018-02-17 MED ORDER — CARVEDILOL 3.125 MG PO TABS
6.2500 mg | ORAL_TABLET | Freq: Two times a day (BID) | ORAL | Status: DC
  Filled 2018-02-17: qty 2

## 2018-02-17 MED ORDER — CARVEDILOL 3.125 MG PO TABS
6.2500 mg | ORAL_TABLET | Freq: Two times a day (BID) | ORAL | Status: DC
  Administered 2018-02-17 – 2018-02-18 (×2): 6.25 mg via ORAL
  Filled 2018-02-17 (×2): qty 2

## 2018-02-17 MED ORDER — ONDANSETRON HCL 4 MG/2ML IJ SOLN: 4.0000 mg | Freq: Four times a day (QID) | INTRAMUSCULAR | Status: DC | PRN

## 2018-02-17 MED ORDER — ACETAMINOPHEN 650 MG RE SUPP: 650.0000 mg | Freq: Four times a day (QID) | RECTAL | Status: DC | PRN

## 2018-02-17 MED ORDER — ROSUVASTATIN CALCIUM 10 MG PO TABS
10.0000 mg | ORAL_TABLET | Freq: Every day | ORAL | Status: DC
  Administered 2018-02-17: 10 mg via ORAL
  Filled 2018-02-17 (×2): qty 1

## 2018-02-17 MED ORDER — AMLODIPINE BESYLATE 10 MG PO TABS
10.0000 mg | ORAL_TABLET | Freq: Every day | ORAL | Status: DC
  Administered 2018-02-17 – 2018-02-18 (×2): 10 mg via ORAL
  Filled 2018-02-17 (×2): qty 1

## 2018-02-17 MED ORDER — TAMSULOSIN HCL 0.4 MG PO CAPS
0.4000 mg | ORAL_CAPSULE | Freq: Every day | ORAL | Status: DC
  Administered 2018-02-17 – 2018-02-18 (×2): 0.4 mg via ORAL
  Filled 2018-02-17 (×2): qty 1

## 2018-02-17 MED ORDER — ASPIRIN 81 MG PO CHEW
81.0000 mg | CHEWABLE_TABLET | Freq: Every day | ORAL | Status: DC
  Filled 2018-02-17: qty 1

## 2018-02-17 MED ORDER — ONDANSETRON HCL 4 MG PO TABS: 4.0000 mg | ORAL_TABLET | Freq: Four times a day (QID) | ORAL | Status: DC | PRN

## 2018-02-17 MED ORDER — ACETAMINOPHEN 325 MG PO TABS: 650.0000 mg | ORAL_TABLET | Freq: Four times a day (QID) | ORAL | Status: DC | PRN

## 2018-02-17 MED ORDER — AMLODIPINE BESYLATE 10 MG PO TABS: 10.0000 mg | ORAL_TABLET | Freq: Every day | ORAL | Status: DC

## 2018-02-17 MED ORDER — BISACODYL 5 MG PO TBEC: 5.0000 mg | DELAYED_RELEASE_TABLET | Freq: Every day | ORAL | Status: DC | PRN

## 2018-02-17 MED ORDER — SENNOSIDES-DOCUSATE SODIUM 8.6-50 MG PO TABS: 1.0000 | ORAL_TABLET | Freq: Every evening | ORAL | Status: DC | PRN

## 2018-02-17 NOTE — ED Notes (Signed)
Pt's monitor alarming "apnea"; in to check on pt, awake; in no distress; pt says he doesn't think he's been able to sleep much; [place] tech to department and aware pt is awake; spoke with pt and he is fine to have his ordered US at the bedside now; "Might as well get it out of the way"

## 2018-02-17 NOTE — ED Notes (Signed)
Pt provided breakfast tray at this time, pt eating , NAD noted

## 2018-02-17 NOTE — ED Notes (Signed)
Pt ambulated to commode for a bowel movement. Pt self hygiene, returned to bed and is resting comfortably.

## 2018-02-17 NOTE — ED Notes (Signed)
ED TO INPATIENT HANDOFF REPORT  Name/Age/Gender Lawrence Perkins. 78 y.o. male  Code Status    Code Status Orders  (From admission, onward)         Start     Ordered   02/17/18 0328  Full code  Continuous     02/17/18 0327        Code Status History    Date Active Date Inactive Code Status Order ID Comments User Context   03/06/2016 1040 03/08/2016 1504 Full Code [id number]  Hessie Knows, MD Inpatient    Advance Directive Documentation     Most Recent Value  Type of Advance Directive  Healthcare Power of Attorney, Living will  Pre-existing out of facility DNR order (yellow form or pink MOST form)  -  "MOST" Form in Place?  -      Home/SNF/Other Home  Chief Complaint Bradycardia; Near Syncope  Level of Care/Admitting Diagnosis ED Disposition    ED Disposition Condition [facility] Area: [place] [[id number]]  Level of Care: Med-Surg [16]  Diagnosis: Syncope [852778]  Admitting Physician: Hyman Bible DODD [[id number]]  Attending Physician: Hyman Bible DODD [[id number]]  Estimated length of stay: past midnight tomorrow  Certification:: I certify this patient will need inpatient services for at least 2 midnights  PT Class (Do Not Modify): Inpatient [101]  PT Acc Code (Do Not Modify): Private [1]       Medical History Past Medical History:  Diagnosis Date  . BPH (benign prostatic hyperplasia)   . Cardiomyopathy ([place])   . CHF (congestive heart failure) ([place])   . Chronic kidney disease   . Complication of anesthesia    has converted to afib during and/or after surgery in the past  . Diverticulosis   . Dysrhythmia    afib  . Gout   . History of colon polyps   . Hypercholesteremia   . Hypertension   . Melanoma ([place])    status post resection from his back  . MGUS (monoclonal gammopathy of unknown significance)   . Pleural effusion   . Prostatism   . Rectal bleeding     Allergies No Known Allergies  IV  Location/Drains/Wounds Patient Lines/Drains/Airways Status   Active Line/Drains/Airways    Name:   Placement date:   Placement time:   Site:   Days:   Peripheral IV 02/16/18 Left Antecubital   02/16/18    2058    Antecubital   1   Peripheral IV 02/16/18 Right Forearm   02/16/18    2101    Forearm   1   Airway   03/06/16    0709     713   Incision (Closed) 03/06/16 Hip Left   03/06/16    0744     713          Labs/Imaging Results for orders placed or performed during the hospital encounter of 02/16/18 (from the past 48 hour(s))  Basic metabolic panel     Status: Abnormal   Collection Time: 02/16/18  8:59 PM  Result Value Ref Range   Sodium 138 135 - 145 mmol/L   Potassium 5.0 3.5 - 5.1 mmol/L   Chloride 103 98 - 111 mmol/L   CO2 25 22 - 32 mmol/L   Glucose, Bld 113 (H) 70 - 99 mg/dL   BUN 95 (H) 8 - 23 mg/dL   Creatinine, Ser 3.56 (H) 0.61 - 1.24 mg/dL   Calcium 9.2 8.9 - 10.3 mg/dL  GFR calc non Af Amer 15 (L) >60 mL/min   GFR calc Af Amer 18 (L) >60 mL/min   Anion gap 10 5 - 15    Comment: Performed at [facility], [address] [place], [place] [address]  CBC     Status: Abnormal   Collection Time: 02/16/18  8:59 PM  Result Value Ref Range   WBC 6.6 4.0 - 10.5 K/uL   RBC 3.41 (L) 4.22 - 5.81 MIL/uL   Hemoglobin 11.2 (L) 13.0 - 17.0 g/dL   HCT 33.5 (L) 39.0 - 52.0 %   MCV 98.2 80.0 - 100.0 fL   MCH 32.8 26.0 - 34.0 pg   MCHC 33.4 30.0 - 36.0 g/dL   RDW 12.9 11.5 - 15.5 %   Platelets 200 150 - 400 K/uL   nRBC 0.0 0.0 - 0.2 %    Comment: Performed at [facility], [address] [place], [place] [address]  Urinalysis, Complete w Microscopic     Status: Abnormal   Collection Time: 02/16/18  8:59 PM  Result Value Ref Range   Color, Urine STRAW (A) YELLOW   APPearance CLEAR (A) CLEAR   Specific Gravity, Urine 1.009 1.005 - 1.030   pH 6.0 5.0 - 8.0   Glucose, UA NEGATIVE NEGATIVE mg/dL   Hgb urine dipstick MODERATE (A) NEGATIVE   Bilirubin  Urine NEGATIVE NEGATIVE   Ketones, ur NEGATIVE NEGATIVE mg/dL   Protein, ur 100 (A) NEGATIVE mg/dL   Nitrite NEGATIVE NEGATIVE   Leukocytes,Ua NEGATIVE NEGATIVE   RBC / HPF 0-5 0 - 5 RBC/hpf   WBC, UA 0-5 0 - 5 WBC/hpf   Bacteria, UA NONE SEEN NONE SEEN   Squamous Epithelial / LPF NONE SEEN 0 - 5    Comment: Performed at [facility], [address] [place], [place] [address]  Troponin I - Add-On to previous collection     Status: None   Collection Time: 02/16/18  8:59 PM  Result Value Ref Range   Troponin I <0.03 <0.03 ng/mL    Comment: Performed at [facility], [address] [place], [place] [address]  Hepatic function panel     Status: Abnormal   Collection Time: 02/17/18  1:30 AM  Result Value Ref Range   Total Protein 7.3 6.5 - 8.1 g/dL   Albumin 4.1 3.5 - 5.0 g/dL   AST 12 (L) 15 - 41 U/L   ALT 11 0 - 44 U/L   Alkaline Phosphatase 57 38 - 126 U/L   Total Bilirubin 0.9 0.3 - 1.2 mg/dL   Bilirubin, Direct <0.1 0.0 - 0.2 mg/dL   Indirect Bilirubin NOT CALCULATED 0.3 - 0.9 mg/dL    Comment: Performed at [facility], [address] [place], [place] [address]  Magnesium     Status: None   Collection Time: 02/17/18  1:30 AM  Result Value Ref Range   Magnesium 2.2 1.7 - 2.4 mg/dL    Comment: Performed at [facility], [address] [place], [place] [address]  Phosphorus     Status: Abnormal   Collection Time: 02/17/18  1:30 AM  Result Value Ref Range   Phosphorus 4.9 (H) 2.5 - 4.6 mg/dL    Comment: Performed at [facility], [place], [place] [address]  Na and K (sodium & potassium), rand urine     Status: None   Collection Time: 02/17/18  1:30 AM  Result Value Ref Range   Sodium, Ur  62 mmol/L   Potassium Urine 32 mmol/L    Comment: Performed at [facility], [address] [place], [place] [address]  Creatinine, urine, random     Status: None   Collection Time: 02/17/18  1:30 AM  Result  Value Ref Range   Creatinine, Urine 56 mg/dL    Comment: Performed at [facility], [address] [place], [place] [address]  Protein, urine, random     Status: None   Collection Time: 02/17/18  1:30 AM  Result Value Ref Range   Total Protein, Urine 90 mg/dL    Comment: NO NORMAL RANGE ESTABLISHED FOR THIS TEST Performed at [facility], [address] [place], [place] [address]   Troponin I - Once     Status: None   Collection Time: 02/17/18  1:30 AM  Result Value Ref Range   Troponin I <0.03 <0.03 ng/mL    Comment: Performed at [facility], [address] [place], [place] [address]  Troponin I - Tomorrow AM 0500     Status: None   Collection Time: 02/17/18  5:00 AM  Result Value Ref Range   Troponin I <0.03 <0.03 ng/mL    Comment: Performed at [facility], [address] [place], [place] [address]  CK     Status: Abnormal   Collection Time: 02/17/18  5:00 AM  Result Value Ref Range   Total CK 42 (L) 49 - 397 U/L    Comment: Performed at [facility], [address] [place], [place] [address]  Basic metabolic panel     Status: Abnormal   Collection Time: 02/17/18  5:22 AM  Result Value Ref Range   Sodium 138 135 - 145 mmol/L   Potassium 4.7 3.5 - 5.1 mmol/L   Chloride 106 98 - 111 mmol/L   CO2 24 22 - 32 mmol/L   Glucose, Bld 97 70 - 99 mg/dL   BUN 83 (H) 8 - 23 mg/dL   Creatinine, Ser 3.30 (H) 0.61 - 1.24 mg/dL   Calcium 9.0 8.9 - 10.3 mg/dL   GFR calc non Af Amer 17 (L) >60 mL/min   GFR calc Af Amer 20 (L) >60 mL/min   Anion gap 8 5 - 15    Comment: Performed at [facility], [address] [place], [place] [address]  Glucose, capillary     Status: None   Collection Time: 02/17/18  8:09 AM  Result Value Ref Range   Glucose-Capillary 89 70 - 99 mg/dL   US Renal  Result Date: 02/17/2018 CLINICAL DATA:  Acute kidney injury EXAM: RENAL / URINARY TRACT ULTRASOUND COMPLETE COMPARISON:  09/11/2016  FINDINGS: Right Kidney: Renal measurements: 10 x 5 x 4 cm. Diffusely increased cortical echogenicity with abnormal corticomedullary differentiation. No hydronephrosis. Lower cortical cysts measuring up to 16 mm, simple appearing Left Kidney: Renal measurements: 10 x 5 x 5 cm. Diffusely increased cortical echogenicity. No hydronephrosis. Bladder: Trabeculated bladder which is moderately distended. No focal wall thickening or internal debris. IMPRESSION: 1. Medical renal disease. 2. Trabeculated bladder suggesting chronic outlet obstruction. Currently there is moderate bladder distention. No hydronephrosis. Electronically Signed   By: Monte Fantasia M.D.   On: 02/17/2018 04:23   Dg Chest Portable 1 View  Result Date: 02/16/2018 CLINICAL DATA:  Near syncope. EXAM: PORTABLE CHEST 1 VIEW COMPARISON:  Chest x-ray dated 08/22/2017. FINDINGS: Borderline cardiomegaly. Lungs are clear. No pleural effusion or pneumothorax seen. Osseous structures about the chest are unremarkable. IMPRESSION:  No active disease. No evidence of pneumonia or pulmonary edema. Electronically Signed   By: Franki Cabot M.D.   On: 02/16/2018 23:36    Pending Labs Unresulted Labs (From admission, onward)    Start     Ordered   02/18/18 6568  Basic metabolic panel  Tomorrow morning,   STAT     02/17/18 0223   02/18/18 0500  CBC  Tomorrow morning,   STAT     02/17/18 1312   02/17/18 0001  Urea nitrogen, urine  Once,   STAT     02/17/18 0000          Vitals/Pain Today's Vitals   02/17/18 1147 02/17/18 1200 02/17/18 1422 02/17/18 1555  BP: 133/83 109/67 (!) 151/72 (!) 151/72  Pulse: 60 (!) 54 70 71  Resp: 15 15 17 15   Temp:    98 F (36.7 C)  TempSrc:    Oral  SpO2: 100% 100% 100% 98%  Weight:      Height:      PainSc: 0-No pain  0-No pain 0-No pain    Isolation Precautions No active isolations  Medications Medications  senna-docusate (Senokot-S) tablet 1 tablet (has no administration in time range)  bisacodyl  (DULCOLAX) EC tablet 5 mg (has no administration in time range)  ondansetron (ZOFRAN) tablet 4 mg (has no administration in time range)    Or  ondansetron (ZOFRAN) injection 4 mg (has no administration in time range)  acetaminophen (TYLENOL) tablet 650 mg (has no administration in time range)    Or  acetaminophen (TYLENOL) suppository 650 mg (has no administration in time range)  apixaban (ELIQUIS) tablet 5 mg (5 mg Oral Given 02/17/18 1029)  carvedilol (COREG) tablet 6.25 mg (has no administration in time range)  rosuvastatin (CRESTOR) tablet 10 mg (has no administration in time range)  tamsulosin (FLOMAX) capsule 0.4 mg (has no administration in time range)  amLODipine (NORVASC) tablet 10 mg (has no administration in time range)  sodium chloride 0.9 % bolus 250 mL (0 mLs Intravenous Stopped 02/17/18 0054)    Mobility walks

## 2018-02-17 NOTE — H&P (Signed)
[name]    MR#:  [id number]  DATE OF BIRTH:  02/06/1940  DATE OF ADMISSION:  02/16/2018  PRIMARY CARE PHYSICIAN: Kirk Ruths, MD   REQUESTING/REFERRING PHYSICIAN: Merlyn Lot, MD  CHIEF COMPLAINT:   Chief Complaint  Patient presents with  . Near Syncope    HISTORY OF PRESENT ILLNESS:  Lawrence Perkins (pronounced "crane") is a 78 y.o. male with a known history of T2NIDDM, HTN, HLD, CKD IV, Afib (Eliquis), p/w syncope, renal failure. Pt's cardiologist is Dr. Nehemiah Massed. EF 50%, (-) AS (as of 09/03/2017 Echo). Pt was at a wake, and was standing at the casket @~2030PM (Sun 02/16), when he became lightheaded, pale and diaphoretic. Someone noticed he looked unwell, and asked him if he was okay. He did not respond. He became weak and appeared as though he was about to collapse. He was helped down to the floor slowly. No injuries were sustained. HR 30s, SBP 90s. EMS called. VSS in ED. BUN 95, Cr 3.56 (increased from BUN 41, Cr 2.5 as of 10/02/2017). Baseline CKD IV, baseline Cr appears to be 2.2-2.5.  PAST MEDICAL HISTORY:   Past Medical History:  Diagnosis Date  . BPH (benign prostatic hyperplasia)   . Cardiomyopathy ([place])   . CHF (congestive heart failure) ([place])   . Chronic kidney disease   . Complication of anesthesia    has converted to afib during and/or after surgery in the past  . Diverticulosis   . Dysrhythmia    afib  . Gout   . History of colon polyps   . Hypercholesteremia   . Hypertension   . 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 MGUS (monoclonal gammopathy of unknown significance)   . Pleural effusion   . Prostatism   . Rectal bleeding     PAST SURGICAL HISTORY:   Past Surgical History:  Procedure Laterality Date  . ANKLE SURGERY     following volleyball injury  . APPENDECTOMY    . COLONOSCOPY    . COLONOSCOPY WITH PROPOFOL N/A 01/02/2017   Procedure:  COLONOSCOPY WITH PROPOFOL;  Surgeon: Manya Silvas, MD;  Location: [facility] ENDOSCOPY;  Service: Endoscopy;  Laterality: N/A;  . JOINT REPLACEMENT     RIGHT TOTAL HIP  . TONSILLECTOMY    . TOTAL HIP ARTHROPLASTY  09/2009   Right femoral neck fracture  . TOTAL HIP ARTHROPLASTY Left 03/06/2016   Procedure: TOTAL HIP ARTHROPLASTY ANTERIOR APPROACH;  Surgeon: Hessie Knows, MD;  Location: [facility] ORS;  Service: Orthopedics;  Laterality: Left;    SOCIAL HISTORY:   Social History   Tobacco Use  . Smoking status: Former Smoker    Years: 15.00    Last attempt to quit: 01/01/1981    Years since quitting: 37.1  . Smokeless tobacco: Never Used  Substance Use Topics  . Alcohol use: Yes    Alcohol/week: 2.0 standard drinks    Types: 2 Cans of beer per week    FAMILY HISTORY:   Family History  Problem Relation Age of Onset  . Cirrhosis Mother   . Other Father        Lung Fibrosis  . Prostate cancer Neg Hx   . Kidney cancer Neg Hx   . Bladder Cancer Neg Hx     DRUG ALLERGIES:  No Known Allergies  REVIEW OF SYSTEMS:   Review of Systems  Constitutional: Positive for diaphoresis and malaise/fatigue. Negative  for chills, fever and weight loss.  HENT: Negative for congestion, ear pain, hearing loss, nosebleeds, sinus pain, sore throat and tinnitus.   Eyes: Negative for blurred vision, double vision and photophobia.  Respiratory: Negative for cough, hemoptysis, sputum production, shortness of breath and wheezing.   Cardiovascular: Negative for chest pain, palpitations, orthopnea, claudication, leg swelling and PND.  Gastrointestinal: Negative for abdominal pain, blood in stool, constipation, diarrhea, heartburn, melena, nausea and vomiting.  Genitourinary: Negative for dysuria, frequency, hematuria and urgency.  Musculoskeletal: Negative for back pain, joint pain, myalgias and neck pain.  Skin: Negative for itching and rash.  Neurological: Positive for dizziness, loss of consciousness and  weakness. Negative for tingling, tremors, sensory change, speech change, focal weakness, seizures and headaches.  Psychiatric/Behavioral: Negative for depression and memory loss. The patient is not nervous/anxious and does not have insomnia.    MEDICATIONS AT HOME:   Prior to Admission medications   Medication Sig Start Date End Date Taking? Authorizing Provider  amLODipine (NORVASC) 10 MG tablet Take 10 mg by mouth daily. 01/20/18  Yes [provider]  apixaban (ELIQUIS) 5 MG TABS tablet Take 5 mg by mouth 2 (two) times daily.   Yes [provider]  carvedilol (COREG) 6.25 MG tablet Take 6.25 mg by mouth 2 (two) times daily with a meal.   Yes [provider]  Cholecalciferol (VITAMIN D3) 2000 units capsule Take by mouth.   Yes [provider]  dapagliflozin propanediol (FARXIGA) 10 MG TABS tablet Take 10 mg by mouth daily. Patient on trial   Yes [provider]  enalapril (VASOTEC) 5 MG tablet Take 5 mg by mouth daily.    Yes [provider]  rosuvastatin (CRESTOR) 10 MG tablet Take 10 mg by mouth daily. 09/23/17  Yes [provider]  tamsulosin (FLOMAX) 0.4 MG CAPS capsule Take 1 capsule (0.4 mg total) by mouth daily. 01/24/18  Yes McGowan, Shannon A, PA-C      VITAL SIGNS:  Blood pressure 131/73, pulse 70, resp. rate 16, height 5\' 10"  (1.778 m), weight 82.6 kg, SpO2 97 %.  PHYSICAL EXAMINATION:  Physical Exam Constitutional:      General: He is not in acute distress.    Appearance: He is not ill-appearing, toxic-appearing or diaphoretic.  HENT:     Head: Atraumatic.     Mouth/Throat:     Mouth: Mucous membranes are dry.     Pharynx: Oropharynx is clear.  Eyes:     General: No scleral icterus.    Extraocular Movements: Extraocular movements intact.     Conjunctiva/sclera: Conjunctivae normal.  Neck:     Musculoskeletal: Neck supple.  Cardiovascular:     Rate and Rhythm: Normal rate. Rhythm irregular.     Heart  sounds: Normal heart sounds. No murmur. No friction rub. No gallop.   Pulmonary:     Effort: No respiratory distress.     Breath sounds: Normal breath sounds. No stridor. No wheezing, rhonchi or rales.  Abdominal:     General: Bowel sounds are normal. There is no distension.     Palpations: Abdomen is soft.     Tenderness: There is no abdominal tenderness. There is no guarding or rebound.  Musculoskeletal: Normal range of motion.        General: No swelling or tenderness.     Right lower leg: No edema.     Left lower leg: No edema.  Lymphadenopathy:     Cervical: No cervical adenopathy.  Skin:  General: Skin is warm and dry.     Findings: No erythema or rash.  Neurological:     Mental Status: He is alert and oriented to person, place, and time. Mental status is at baseline.  Psychiatric:        Mood and Affect: Mood normal.        Behavior: Behavior normal.        Thought Content: Thought content normal.        Judgment: Judgment normal.    LABORATORY PANEL:   CBC Recent Labs  Lab 02/16/18 2059  WBC 6.6  HGB 11.2*  HCT 33.5*  PLT 200   ------------------------------------------------------------------------------------------------------------------  Chemistries  Recent Labs  Lab 02/16/18 2059 02/17/18 0130  NA 138  --   K 5.0  --   CL 103  --   CO2 25  --   GLUCOSE 113*  --   BUN 95*  --   CREATININE 3.56*  --   CALCIUM 9.2  --   MG  --  2.2  AST  --  12*  ALT  --  11  ALKPHOS  --  57  BILITOT  --  0.9   ------------------------------------------------------------------------------------------------------------------  Cardiac Enzymes Recent Labs  Lab 02/17/18 0130  TROPONINI <0.03   ------------------------------------------------------------------------------------------------------------------  RADIOLOGY:  Dg Chest Portable 1 View  Result Date: 02/16/2018 CLINICAL DATA:  Near syncope. EXAM: PORTABLE CHEST 1 VIEW COMPARISON:  Chest x-ray dated  08/22/2017. FINDINGS: Borderline cardiomegaly. Lungs are clear. No pleural effusion or pneumothorax seen. Osseous structures about the chest are unremarkable. IMPRESSION: No active disease. No evidence of pneumonia or pulmonary edema. Electronically Signed   By: Franki Cabot M.D.   On: 02/16/2018 23:36   IMPRESSION AND PLAN:   A/P: T2NIDDM, HTN, HLD, CKD IV, Afib (Eliquis), p/w syncope, renal failure (AKI on CKD IV). Hyperglycemia (w/ T2NIDDM), uremia, hyperphosphatemia, normocytic anemia. -Syncope: Likely intravascular volume depletion + vasovagal syncope. Less likely neurogenic or cardiogenic. 09/03/2017 Echo did not demonstrate cardiomyopathy or valvular stenosis. Trop-I (-) x2, rpt pending. Cardiac monitoring. ASA, orthostatic VS, FSG qHS/AC, neuro checks q4h x24hr, fall precautions. IVF. -Renal failure, uremia, hyperphosphatemia: Cr 3.56. Baseline Cr 2.2-2.5. Baseline CKD IV w/ superimposed AKI, likely prerenal. BUN 95. Phos 4.6. Renal U/S, urine studies (electrolytes, protein, creatinine, urea nitrogen). IVF. Monitor BMP, avoid nephrotoxins. -Hyperglycemia, T2NIDDM: SSI. -Normocytic anemia: Likely anemia of chronic (renal) disease. Stable, low suspicion for active/acute blood loss at present time. -c/w home meds/formulary subs as tolerated. -FEN/GI: Renal diet. -DVT PPx: Eliquis. -Code status: Full code. -Disposition: Admission, > 2 midnights.   All the records are reviewed and case discussed with ED provider. Management plans discussed with the patient, family and they are in agreement.  CODE STATUS: Full code.  TOTAL TIME TAKING CARE OF THIS PATIENT: 75 minutes.    Arta Silence M.D on 02/17/2018 at 3:04 AM  Between 7am to 6pm - Pager - [phone]  After 6pm go to [email or web address] - Technical brewer [place] [facility]  Office  [phone]  CC: Primary care physician; Kirk Ruths, MD   Note: This dictation was prepared with Dragon  dictation along with smaller phrase technology. Any transcriptional errors that result from this process are unintentional.

## 2018-02-17 NOTE — Progress Notes (Signed)
Made Dr. Brett Albino aware that patient's HR dipped down into the 40s, then back to 68.  Verbal order to hold night dose of coreg.  Clarise Cruz, BSN

## 2018-02-17 NOTE — ED Notes (Signed)
Pt ambulatory to restroom at this time. 

## 2018-02-17 NOTE — ED Notes (Signed)
Blood drawn and sent to lab; second warm blanket for pt's comfort; thermostat in room adjusted as well; call bell and urinal in reach; pt encouraged to call for any needs

## 2018-02-17 NOTE — ED Notes (Signed)
In to draw blood and pt says he needs to void again; up to toilet in room with steady gait

## 2018-02-17 NOTE — ED Notes (Signed)
[place] tech finished;

## 2018-02-17 NOTE — ED Notes (Signed)
Pt up to use toilet in room; moved hospital bed in for pt comfort as he will have to stay in the ED until room is available on the telemetry unit; cardiac monitor in place; side rail up with call bell in reach

## 2018-02-17 NOTE — Progress Notes (Signed)
[place] [facility] [place] NAME: Lawrence Perkins    MR#:  [id number]  DATE OF BIRTH:  [birth year]  SUBJECTIVE:   Patient states he is feeling fine this morning.  No additional episodes of dizziness.  No fevers or chills.  No chest pain or shortness of breath.  REVIEW OF SYSTEMS:  Review of Systems  Constitutional: Negative for chills and fever.  HENT: Negative for congestion and sore throat.   Eyes: Negative for blurred vision and double vision.  Respiratory: Negative for cough and shortness of breath.   Cardiovascular: Negative for chest pain and palpitations.  Gastrointestinal: Negative for nausea and vomiting.  Genitourinary: Negative for dysuria and urgency.  Musculoskeletal: Negative for back pain and neck pain.  Neurological: Negative for dizziness and headaches.  Psychiatric/Behavioral: Negative for depression. The patient is not nervous/anxious.    DRUG ALLERGIES:  No Known Allergies VITALS:  Blood pressure 133/83, pulse 60, resp. rate 15, height 5\' 10"  (1.778 m), weight 82.6 kg, SpO2 100 %. PHYSICAL EXAMINATION:  Physical Exam  Constitutional: Well-appearing, lying in bed in no acute distress HEENT: Normocephalic, atraumatic, EOMI, no scleral icterus, moist mucous membranes Neck: Supple, normal range of motion Cardiovascular: Irregularly irregular rate, regular rhythm, no murmurs, rubs, gallops Respiratory: Lungs clear to auscultation bilaterally, normal work of breathing Gastrointestinal: +BS, soft, nontender, nondistended Musculoskeletal: No cyanosis, clubbing, pedal edema Neurologic:   CN II through XII grossly intact, no focal deficits, sensation intact throughout Skin:  Skin is warm, dry and intact. No rash noted. Psychiatric: Mood and affect are normal. Speech and behavior are normal.  Alert and oriented x3. LABORATORY PANEL:  Male CBC Recent Labs  Lab 02/16/18 2059  WBC 6.6  HGB 11.2*  HCT 33.5*  PLT 200    ------------------------------------------------------------------------------------------------------------------ Chemistries  Recent Labs  Lab 02/17/18 0130 02/17/18 0522  NA  --  138  K  --  4.7  CL  --  106  CO2  --  24  GLUCOSE  --  97  BUN  --  83*  CREATININE  --  3.30*  CALCIUM  --  9.0  MG 2.2  --   AST 12*  --   ALT 11  --   ALKPHOS 57  --   BILITOT 0.9  --    RADIOLOGY:  US Renal  Result Date: 02/17/2018 CLINICAL DATA:  Acute kidney injury EXAM: RENAL / URINARY TRACT ULTRASOUND COMPLETE COMPARISON:  09/11/2016 FINDINGS: Right Kidney: Renal measurements: 10 x 5 x 4 cm. Diffusely increased cortical echogenicity with abnormal corticomedullary differentiation. No hydronephrosis. Lower cortical cysts measuring up to 16 mm, simple appearing Left Kidney: Renal measurements: 10 x 5 x 5 cm. Diffusely increased cortical echogenicity. No hydronephrosis. Bladder: Trabeculated bladder which is moderately distended. No focal wall thickening or internal debris. IMPRESSION: 1. Medical renal disease. 2. Trabeculated bladder suggesting chronic outlet obstruction. Currently there is moderate bladder distention. No hydronephrosis. Electronically Signed   By: Monte Fantasia M.D.   On: 02/17/2018 04:23   Dg Chest Portable 1 View  Result Date: 02/16/2018 CLINICAL DATA:  Near syncope. EXAM: PORTABLE CHEST 1 VIEW COMPARISON:  Chest x-ray dated 08/22/2017. FINDINGS: Borderline cardiomegaly. Lungs are clear. No pleural effusion or pneumothorax seen. Osseous structures about the chest are unremarkable. IMPRESSION: No active disease. No evidence of pneumonia or pulmonary edema. Electronically Signed   By: Franki Cabot M.D.   On: 02/16/2018 23:36   ASSESSMENT AND PLAN:   Syncope- likely vasovagal.  Recent echo unremarkable. -Cardiac monitoring -Check orthostatic vitals  Acute renal failure in CKD IV- creatinine improved from 3.56 > 3.30. Baseline Cr 2.2-2.5. Follows with [facility] nephrology. -Renal  ultrasound with trabeculated bladder suggesting chronic outlet obstruction  Chronic atrial fibrillation- rate controlled. Follows with Dr. Nehemiah Massed. -Continue Coreg and Eliquis  Hypertension- BPs normal -Continue Norvasc and Coreg -Holding enalapril with acute renal failure  Chronic diastolic congestive heart failure- stable, no signs of volume overload. -Continue Coreg -Holding ACE due to ARF -Holding torsemide  Anemia of chronic kidney disease- hgb close to baseline -Monitor  BPH- renal ultrasound with chronic bladder outlet obstruction. Patient denies any urinary symptoms -Continue Flomax -Follow-up with urology as an outpatient  Hyperlipidemia-stable -Continue home Crestor  History of MGUS- follows with Dr. Grayland Ormond as an outpatient  All the records are reviewed and case discussed with Care Management/Social Worker. Management plans discussed with the patient, family and they are in agreement.  CODE STATUS: Full Code  TOTAL TIME TAKING CARE OF THIS PATIENT: 45 minutes.   More than 50% of the time was spent in counseling/coordination of care: YES  POSSIBLE D/C IN 1-2 DAYS, DEPENDING ON CLINICAL CONDITION.   Berna Spare Mayo M.D on 02/17/2018 at 12:56 PM  Between 7am to 6pm - Pager - [phone]  After 6pm go to [email or web address] - Technical brewer [place] [facility]  Office  [phone]  CC: Primary care physician; Kirk Ruths, MD  Note: This dictation was prepared with Dragon dictation along with smaller phrase technology. Any transcriptional errors that result from this process are unintentional.

## 2018-02-18 ENCOUNTER — Inpatient Hospital Stay: Payer: PPO

## 2018-02-18 LAB — CBC
HCT: 31.6 % — ABNORMAL LOW (ref 39.0–52.0)
Hemoglobin: 10.4 g/dL — ABNORMAL LOW (ref 13.0–17.0)
MCH: 32.5 pg (ref 26.0–34.0)
MCHC: 32.9 g/dL (ref 30.0–36.0)
MCV: 98.8 fL (ref 80.0–100.0)
Platelets: 171 10*3/uL (ref 150–400)
RBC: 3.2 MIL/uL — AB (ref 4.22–5.81)
RDW: 12.9 % (ref 11.5–15.5)
WBC: 5.9 10*3/uL (ref 4.0–10.5)
nRBC: 0 % (ref 0.0–0.2)

## 2018-02-18 LAB — BASIC METABOLIC PANEL
ANION GAP: 4 — AB (ref 5–15)
BUN: 82 mg/dL — ABNORMAL HIGH (ref 8–23)
CO2: 24 mmol/L (ref 22–32)
Calcium: 8.4 mg/dL — ABNORMAL LOW (ref 8.9–10.3)
Chloride: 112 mmol/L — ABNORMAL HIGH (ref 98–111)
Creatinine, Ser: 3.12 mg/dL — ABNORMAL HIGH (ref 0.61–1.24)
GFR calc Af Amer: 21 mL/min — ABNORMAL LOW (ref >60)
GFR, EST NON AFRICAN AMERICAN: 18 mL/min — AB (ref >60)
GLUCOSE: 92 mg/dL (ref 70–99)
Potassium: 4.8 mmol/L (ref 3.5–5.1)
Sodium: 140 mmol/L (ref 135–145)

## 2018-02-18 LAB — UREA NITROGEN, URINE: Urea Nitrogen, Ur: 500 mg/dL

## 2018-02-18 MED ORDER — TORSEMIDE 20 MG PO TABS: 40.0000 mg | ORAL_TABLET | Freq: Every day | ORAL | 0 refills | Status: AC

## 2018-02-18 NOTE — Plan of Care (Signed)
  Problem: Education: Goal: Knowledge of General Education information will improve Description Including pain rating scale, medication(s)/side effects and non-pharmacologic comfort measures Outcome: Progressing   Problem: Health Behavior/Discharge Planning: Goal: Ability to manage health-related needs will improve Outcome: Progressing   Problem: Clinical Measurements: Goal: Ability to maintain clinical measurements within normal limits will improve Outcome: Progressing   Problem: Activity: Goal: Risk for activity intolerance will decrease Outcome: Progressing   Problem: Fluid Volume: Goal: Compliance with measures to maintain balanced fluid volume will improve Outcome: Progressing   Problem: Clinical Measurements: Goal: Complications related to the disease process, condition or treatment will be avoided or minimized Outcome: Progressing

## 2018-02-18 NOTE — Discharge Summary (Signed)
[facility] NAME: Lawrence Perkins    MR#:  [id number]  DATE OF BIRTH:  10/23/1940  DATE OF ADMISSION:  02/16/2018   ADMITTING PHYSICIAN: Lawrence Hua, MD  DATE OF DISCHARGE: 02/18/18  PRIMARY CARE PHYSICIAN: Lawrence Ruths, MD   ADMISSION DIAGNOSIS:  Syncope and collapse [R55] Dehydration [E86.0] Atrial fibrillation with slow ventricular response (HCC) [I48.91] AKI (acute kidney injury) ([place]) [N17.9] Syncope [R55] DISCHARGE DIAGNOSIS:  Active Problems:   Syncope  SECONDARY DIAGNOSIS:   Past Medical History:  Diagnosis Date  . BPH (benign prostatic hyperplasia)   . Cardiomyopathy ([place])   . CHF (congestive heart failure) ([place])   . Chronic kidney disease   . Complication of anesthesia    has converted to afib during and/or after surgery in the past  . Diverticulosis   . Dysrhythmia    afib  . Gout   . History of colon polyps   . Hypercholesteremia   . Hypertension   . 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 MGUS (monoclonal gammopathy of unknown significance)   . Pleural effusion   . Prostatism   . Rectal bleeding    HOSPITAL COURSE:   Lawrence Perkins is a 78 year old male who presented to the ED near syncope, when he became lightheaded, pale, and diaphoretic while standing over a casket at a wake.  In the ED, he was noted to be in acute renal failure with creatinine 3.56 (baseline 2.5).  He was admitted for further management.  Syncope- likely vasovagal.  Recent echo unremarkable. 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 Baseline Cr 2.5. Likely due to dehydration. -Renal ultrasound with trabeculated bladder suggesting chronic outlet obstruction- needs to f/u with urology -Enalapril discontinued on discharge -Torsemide dose decreased from 60mg  daily to 40mg  daily -Needs to f/u with nephrology as an  outpatient.  Chronic atrial fibrillation- rate controlled. Follows with Dr. Nehemiah Perkins. -Continued Coreg and Eliquis  Hypertension- BPs normal -Continue Norvasc and Coreg -Discontinued enalapril due to acute renal failure  Chronic diastolic congestive heart failure- stable, no signs of volume overload. -Continued Coreg -Discontinued ACE -Torsemide dose decreased from 60 mg daily to 40 mg daily -Needs to follow-up with cardiology as an outpatient  BPH- renal ultrasound with chronic bladder outlet obstruction. Patient denies any urinary symptoms -Continued Flomax -Needs to follow-up with urology as an outpatient  Hyperlipidemia- stable -Continued home Crestor  History of MGUS- follows with Dr. Grayland Perkins as an outpatient  DISCHARGE CONDITIONS:  CKD IV Chronic A. fib Hypertension Chronic diastolic congestive heart failure BPH Hyperlipidemia History of MGUS CONSULTS OBTAINED:  Treatment Team:  Arta Silence, MD DRUG ALLERGIES:  No Known Allergies DISCHARGE MEDICATIONS:   Allergies as of 02/18/2018   No Known Allergies     Medication List    STOP taking these medications   dapagliflozin propanediol 10 MG Tabs tablet Commonly known as:  FARXIGA   enalapril 5 MG tablet Commonly known as:  VASOTEC     TAKE these medications   amLODipine 10 MG tablet Commonly known as:  NORVASC Take 10 mg by mouth daily.   carvedilol 6.25 MG tablet Commonly known as:  COREG Take 6.25 mg by mouth 2 (two) times daily with a meal.   ELIQUIS 5 MG Tabs tablet Generic drug:  apixaban Take 5 mg by mouth 2 (two) times daily.   rosuvastatin  10 MG tablet Commonly known as:  CRESTOR Take 10 mg by mouth daily.   tamsulosin 0.4 MG Caps capsule Commonly known as:  FLOMAX Take 1 capsule (0.4 mg total) by mouth daily.   torsemide 20 MG tablet Commonly known as:  DEMADEX Take 2 tablets (40 mg total) by mouth daily.   Vitamin D3 50 MCG (2000 UT) capsule Take by mouth.          DISCHARGE INSTRUCTIONS:  1.  Follow-up with PCP in 2-3 days for creatinine check 2.  Needs follow-up with cardiology, urology, and nephrology in the next couple weeks 3.  Stop enalapril 4.  Decrease torsemide dose from 60 mg daily to 40 mg daily DIET:  Cardiac diet DISCHARGE CONDITION:  Stable ACTIVITY:  Activity as tolerated OXYGEN:  Home Oxygen: No.  Oxygen Delivery: room air DISCHARGE LOCATION:  home   If you experience worsening of your admission symptoms, develop shortness of breath, life threatening emergency, suicidal or homicidal thoughts you must seek medical attention immediately by calling 911 or calling your MD immediately  if symptoms less severe.  You Must read complete instructions/literature along with all the possible adverse reactions/side effects for all the Medicines you take and that have been prescribed to you. Take any new Medicines after you have completely understood and accpet all the possible adverse reactions/side effects.   Please note  You were cared for by a hospitalist during your hospital stay. If you have any questions about your discharge medications or the care you received while you were in the hospital after you are discharged, you can call the unit and asked to speak with the hospitalist on call if the hospitalist that took care of you is not available. Once you are discharged, your primary care physician will handle any further medical issues. Please note that NO REFILLS for any discharge medications will be authorized once you are discharged, as it is imperative that you return to your primary care physician (or establish a relationship with a primary care physician if you do not have one) for your aftercare needs so that they can reassess your need for medications and monitor your lab values.    On the day of Discharge:  VITAL SIGNS:  Blood pressure 126/86, pulse (!) 50, temperature 98.2 F (36.8 C), temperature source Oral, resp. rate  20, height 5\' 10"  (1.778 m), weight 81.9 kg, SpO2 98 %. PHYSICAL EXAMINATION:  GENERAL:  78 y.o.-year-old patient lying in the bed with no acute distress.  EYES: Pupils equal, round, reactive to light and accommodation. No scleral icterus. Extraocular muscles intact.  HEENT: Head atraumatic, normocephalic. Oropharynx and nasopharynx clear.  NECK:  Supple, no jugular venous distention. No thyroid enlargement, no tenderness.  LUNGS: Normal breath sounds bilaterally, no wheezing, rales,rhonchi or crepitation. No use of accessory muscles of respiration.  CARDIOVASCULAR: RRR, S1, S2 normal. No murmurs, rubs, or gallops.  ABDOMEN: Soft, non-tender, non-distended. Bowel sounds present. No organomegaly or mass.  EXTREMITIES: No pedal edema, cyanosis, or clubbing.  NEUROLOGIC: Cranial nerves II through XII are intact. Muscle strength 5/5 in all extremities. Sensation intact. Gait not checked.  PSYCHIATRIC: The patient is alert and oriented x 3.  SKIN: No obvious rash, lesion, or ulcer.  DATA REVIEW:   CBC Recent Labs  Lab 02/18/18 0342  WBC 5.9  HGB 10.4*  HCT 31.6*  PLT 171    Chemistries  Recent Labs  Lab 02/17/18 0130  02/18/18 0342  NA  --    < >  140  K  --    < > 4.8  CL  --    < > 112*  CO2  --    < > 24  GLUCOSE  --    < > 92  BUN  --    < > 82*  CREATININE  --    < > 3.12*  CALCIUM  --    < > 8.4*  MG 2.2  --   --   AST 12*  --   --   ALT 11  --   --   ALKPHOS 57  --   --   BILITOT 0.9  --   --    < > = values in this interval not displayed.     Microbiology Results  Results for orders placed or performed during the hospital encounter of 08/22/17  Acid Fast Culture with reflexed sensitivities     Status: None   Collection Time: 08/22/17  9:47 AM  Result Value Ref Range Status   Acid Fast Culture Negative  Final    Comment: (NOTE) No acid fast bacilli isolated after 6 weeks. Performed At: [facility] [place], [place] [id number] Rush Farmer MD [id number]    Source of Sample PLEURAL  Final    Comment: Performed at [facility], [address] [place], [place] [address]  Acid Fast Smear (AFB)     Status: None   Collection Time: 08/22/17  9:47 AM  Result Value Ref Range Status   AFB Specimen Processing Concentration  Final   Acid Fast Smear Negative  Final    Comment: (NOTE) Performed At: [facility] [address] [place], [place] [id number] Rush Farmer MD [id number]    Source (AFB) PLEURAL  Final    Comment: Performed at [facility], [address] [place], [place] [address]    RADIOLOGY:  No results found.   Management plans discussed with the patient, family and they are in agreement.  CODE STATUS: Full Code   TOTAL TIME TAKING CARE OF THIS PATIENT: 35 minutes.    Berna Spare  M.D on 02/18/2018 at 9:00 AM  Between 7am to 6pm - Pager - [phone]  After 6pm go to [email or web address] - Technical brewer [facility]  Office  [phone]  CC: Primary care physician; Lawrence Ruths, MD   Note: This dictation was prepared with Dragon dictation along with smaller phrase technology. Any transcriptional errors that result from this process are unintentional.

## 2018-02-18 NOTE — Discharge Instructions (Signed)
It was so nice to meet you during this hospitalization!  You came into the hospital with feeling like you were going to faint. We found that your kidneys were injured. Your kidneys got better with holding some of your medicines.  -Please STOP taking the Enalapril (vasotec) for now. This may be restarted in the future by one of your doctors. -Please decrease the fluid pill (Torsemide) to 2 tablets (40mg ) daily  You will need to have your kidney function labs rechecked in the next 2-3 days.  You should follow-up with your primary care doctor, heart doctor, and kidney doctor in the next couple of weeks.  Take care, Dr. Brett Albino

## 2018-02-21 ENCOUNTER — Ambulatory Visit: Payer: PPO | Admitting: Family

## 2018-02-24 ENCOUNTER — Ambulatory Visit: Payer: PPO | Admitting: Oncology

## 2018-02-24 ENCOUNTER — Inpatient Hospital Stay: Payer: PPO | Attending: Oncology

## 2018-02-24 ENCOUNTER — Other Ambulatory Visit: Payer: Self-pay

## 2018-02-24 DIAGNOSIS — I4891 Unspecified atrial fibrillation: Secondary | ICD-10-CM | POA: Diagnosis not present

## 2018-02-24 DIAGNOSIS — D649 Anemia, unspecified: Secondary | ICD-10-CM | POA: Diagnosis not present

## 2018-02-24 DIAGNOSIS — I129 Hypertensive chronic kidney disease with stage 1 through stage 4 chronic kidney disease, or unspecified chronic kidney disease: Secondary | ICD-10-CM | POA: Diagnosis not present

## 2018-02-24 DIAGNOSIS — N184 Chronic kidney disease, stage 4 (severe): Secondary | ICD-10-CM | POA: Diagnosis not present

## 2018-02-24 DIAGNOSIS — D472 Monoclonal gammopathy: Secondary | ICD-10-CM | POA: Diagnosis not present

## 2018-02-24 DIAGNOSIS — R609 Edema, unspecified: Secondary | ICD-10-CM | POA: Diagnosis not present

## 2018-02-24 DIAGNOSIS — I5022 Chronic systolic (congestive) heart failure: Secondary | ICD-10-CM | POA: Diagnosis not present

## 2018-02-24 DIAGNOSIS — N289 Disorder of kidney and ureter, unspecified: Secondary | ICD-10-CM | POA: Insufficient documentation

## 2018-02-24 LAB — BASIC METABOLIC PANEL
Anion gap: 8 (ref 5–15)
BUN: 65 mg/dL — ABNORMAL HIGH (ref 8–23)
CO2: 24 mmol/L (ref 22–32)
Calcium: 8.8 mg/dL — ABNORMAL LOW (ref 8.9–10.3)
Chloride: 104 mmol/L (ref 98–111)
Creatinine, Ser: 2.73 mg/dL — ABNORMAL HIGH (ref 0.61–1.24)
GFR calc Af Amer: 25 mL/min — ABNORMAL LOW (ref >60)
GFR calc non Af Amer: 21 mL/min — ABNORMAL LOW (ref >60)
GLUCOSE: 82 mg/dL (ref 70–99)
Potassium: 5 mmol/L (ref 3.5–5.1)
Sodium: 136 mmol/L (ref 135–145)

## 2018-02-24 LAB — CBC WITH DIFFERENTIAL/PLATELET
Abs Immature Granulocytes: 0.01 10*3/uL (ref 0.00–0.07)
Basophils Absolute: 0 10*3/uL (ref 0.0–0.1)
Basophils Relative: 0 %
Eosinophils Absolute: 0.2 10*3/uL (ref 0.0–0.5)
Eosinophils Relative: 5 %
HCT: 31.7 % — ABNORMAL LOW (ref 39.0–52.0)
Hemoglobin: 10.8 g/dL — ABNORMAL LOW (ref 13.0–17.0)
IMMATURE GRANULOCYTES: 0 %
LYMPHS PCT: 13 %
Lymphs Abs: 0.7 10*3/uL (ref 0.7–4.0)
MCH: 33 pg (ref 26.0–34.0)
MCHC: 34.1 g/dL (ref 30.0–36.0)
MCV: 96.9 fL (ref 80.0–100.0)
Monocytes Absolute: 0.4 10*3/uL (ref 0.1–1.0)
Monocytes Relative: 8 %
Neutro Abs: 3.8 10*3/uL (ref 1.7–7.7)
Neutrophils Relative %: 74 %
Platelets: 183 10*3/uL (ref 150–400)
RBC: 3.27 MIL/uL — ABNORMAL LOW (ref 4.22–5.81)
RDW: 12.7 % (ref 11.5–15.5)
WBC: 5.1 10*3/uL (ref 4.0–10.5)
nRBC: 0 % (ref 0.0–0.2)

## 2018-02-25 ENCOUNTER — Ambulatory Visit: Payer: PPO | Admitting: Family

## 2018-02-25 ENCOUNTER — Ambulatory Visit: Payer: PPO | Admitting: Oncology

## 2018-02-25 LAB — KAPPA/LAMBDA LIGHT CHAINS
Kappa free light chain: 114.8 mg/L — ABNORMAL HIGH (ref 3.3–19.4)
Kappa, lambda light chain ratio: 0.56 (ref 0.26–1.65)
Lambda free light chains: 204.8 mg/L — ABNORMAL HIGH (ref 5.7–26.3)

## 2018-02-25 LAB — IGG, IGA, IGM
IgA: 353 mg/dL (ref 61–437)
IgG (Immunoglobin G), Serum: 1221 mg/dL (ref 700–1600)
IgM (Immunoglobulin M), Srm: 50 mg/dL (ref 15–143)

## 2018-02-26 LAB — PROTEIN ELECTROPHORESIS, SERUM
A/G Ratio: 1.4 (ref 0.7–1.7)
ALBUMIN ELP: 4 g/dL (ref 2.9–4.4)
Alpha-1-Globulin: 0.2 g/dL (ref 0.0–0.4)
Alpha-2-Globulin: 0.7 g/dL (ref 0.4–1.0)
Beta Globulin: 0.8 g/dL (ref 0.7–1.3)
GLOBULIN, TOTAL: 2.8 g/dL (ref 2.2–3.9)
Gamma Globulin: 1.1 g/dL (ref 0.4–1.8)
M-Spike, %: 0.6 g/dL — ABNORMAL HIGH
Total Protein ELP: 6.8 g/dL (ref 6.0–8.5)

## 2018-02-27 DIAGNOSIS — R809 Proteinuria, unspecified: Secondary | ICD-10-CM | POA: Diagnosis not present

## 2018-02-27 DIAGNOSIS — I129 Hypertensive chronic kidney disease with stage 1 through stage 4 chronic kidney disease, or unspecified chronic kidney disease: Secondary | ICD-10-CM | POA: Diagnosis not present

## 2018-02-27 DIAGNOSIS — D472 Monoclonal gammopathy: Secondary | ICD-10-CM | POA: Diagnosis not present

## 2018-02-27 DIAGNOSIS — N184 Chronic kidney disease, stage 4 (severe): Secondary | ICD-10-CM | POA: Diagnosis not present

## 2018-02-27 DIAGNOSIS — N401 Enlarged prostate with lower urinary tract symptoms: Secondary | ICD-10-CM | POA: Diagnosis not present

## 2018-03-03 ENCOUNTER — Encounter: Payer: Self-pay | Admitting: Oncology

## 2018-03-03 ENCOUNTER — Other Ambulatory Visit: Payer: Self-pay

## 2018-03-03 ENCOUNTER — Inpatient Hospital Stay: Payer: PPO | Attending: Oncology | Admitting: Oncology

## 2018-03-03 VITALS — BP 147/87 | HR 67 | Temp 97.4°F | Ht 71.0 in | Wt 186.4 lb

## 2018-03-03 DIAGNOSIS — Z7901 Long term (current) use of anticoagulants: Secondary | ICD-10-CM

## 2018-03-03 DIAGNOSIS — N189 Chronic kidney disease, unspecified: Secondary | ICD-10-CM | POA: Diagnosis not present

## 2018-03-03 DIAGNOSIS — D472 Monoclonal gammopathy: Secondary | ICD-10-CM | POA: Diagnosis not present

## 2018-03-03 DIAGNOSIS — Z85820 Personal history of malignant melanoma of skin: Secondary | ICD-10-CM | POA: Diagnosis not present

## 2018-03-03 DIAGNOSIS — I10 Essential (primary) hypertension: Secondary | ICD-10-CM | POA: Insufficient documentation

## 2018-03-03 DIAGNOSIS — I4891 Unspecified atrial fibrillation: Secondary | ICD-10-CM | POA: Diagnosis not present

## 2018-03-03 DIAGNOSIS — Z79899 Other long term (current) drug therapy: Secondary | ICD-10-CM | POA: Diagnosis not present

## 2018-03-03 DIAGNOSIS — Z87891 Personal history of nicotine dependence: Secondary | ICD-10-CM

## 2018-03-03 DIAGNOSIS — D649 Anemia, unspecified: Secondary | ICD-10-CM | POA: Insufficient documentation

## 2018-03-03 DIAGNOSIS — I2510 Atherosclerotic heart disease of native coronary artery without angina pectoris: Secondary | ICD-10-CM | POA: Diagnosis not present

## 2018-03-03 DIAGNOSIS — R001 Bradycardia, unspecified: Secondary | ICD-10-CM | POA: Diagnosis not present

## 2018-03-03 NOTE — Progress Notes (Signed)
Lawrence Perkins  [phone] [phone]  ID: Lawrence Perkins. OB: Mar 01, 1940  MR#: [id number]  QQV#:[id number]  Patient Care Team: Kirk Ruths, MD as PCP - General (Internal Medicine)  CHIEF COMPLAINT: MGUS.  INTERVAL HISTORY: Patient returns to clinic today for repeat laboratory work and routine 100-monthevaluation.  He currently feels well and is asymptomatic. He has no neurologic complaints.  He denies any pain. He has a good appetite and denies weight loss. He has no neurologic complaints. He denies any night sweats. He has no chest pain or shortness of breath. He denies any nausea, vomiting, constipation, or diarrhea. He has no urinary complaints.  Patient feels at his baseline offers no specific complaints today.  REVIEW OF SYSTEMS:   Review of Systems  Constitutional: Negative.  Negative for fever, malaise/fatigue and weight loss.  Respiratory: Negative.  Negative for cough and shortness of breath.   Cardiovascular: Negative.  Negative for chest pain and leg swelling.  Gastrointestinal: Negative.  Negative for abdominal pain.  Genitourinary: Negative.  Negative for dysuria.  Musculoskeletal: Negative.  Negative for back pain.  Skin: Negative.  Negative for rash.  Neurological: Negative.  Negative for focal weakness, weakness and headaches.  Endo/Heme/Allergies: Does not bruise/bleed easily.  Psychiatric/Behavioral: Negative.  The patient is not nervous/anxious.     As per HPI. Otherwise, a complete review of systems is negative.  PAST MEDICAL HISTORY: Past Medical History:  Diagnosis Date  . BPH (benign prostatic hyperplasia)   . [place]omy[place]   . CH[place]ive heart failure) ([place]   . Chronic kidney disease   . Complication of anesthesia    has converted to afib during and/or after surgery in the past  . Diverticulosis   . Dysrhythmia    afib  . Gout   . History of colon polyps   . Hypercholesteremia   .  Hypertension   . Melanoma ([place]    status post resection from his back  . MGUS (monoclonal gammopathy of unknown significance)   . Pleural effusion   . Prostatism   . Rectal bleeding     PAST SURGICAL HISTORY: Past Surgical History:  Procedure Laterality Date  . ANKLE SURGERY     following volleyball injury  . APPENDECTOMY    . COLONOSCOPY    . COLONOSCOPY WITH PROPOFOL N/A 01/02/2017   Procedure: COLONOSCOPY WITH PROPOFOL;  Surgeon: EManya Silvas MD;  [facility];  Service: Endoscopy;  Laterality: N/A;  . JOINT REPLACEMENT     RIGHT TOTAL HIP  . TONSILLECTOMY    . TOTAL HIP ARTHROPLASTY  09/2009   Right femoral neck fracture  . TOTAL HIP ARTHROPLASTY Left 03/06/2016   Procedure: TOTAL HIP ARTHROPLASTY ANTERIOR APPROACH;  Surgeon: MHessie Knows MD;  Location: [facility] ORS;  Service: Orthopedics;  Laterality: Left;    FAMILY HISTORY: Family History  Problem Relation Age of Onset  . Cirrhosis Mother   . Other Father        Lung Fibrosis  . Prostate cancer Neg Hx   . Kidney cancer Neg Hx   . Bladder Cancer Neg Hx     ADVANCED DIRECTIVES (Y/N):  N  HEALTH MAINTENANCE: Social History   Tobacco Use  . Smoking status: Former Smoker    Years: 15.00    Last attempt to quit: 01/01/1981    Years since quitting: 37.1  . Smokeless tobacco: Never Used  Substance Use Topics  . Alcohol use: Yes    Alcohol/week: 2.0 standard  drinks    Types: 2 Cans of beer per week  . Drug use: No     Colonoscopy:  PAP:  Bone density:  Lipid panel:  No Known Allergies  Current Outpatient Medications  Medication Sig Dispense Refill  . amLODipine (NORVASC) 10 MG tablet Take 10 mg by mouth daily.    [name] apixaban (ELIQUIS) 5 MG TABS tablet Take 5 mg by mouth 2 (two) times daily.    . carvedilol (COREG) 6.25 MG tablet Take 6.25 mg by mouth 2 (two) times daily with a meal.    . Cholecalciferol (VITAMIN D3) 2000 units capsule Take by mouth.    . rosuvastatin (CRESTOR) 10 MG tablet  Take 10 mg by mouth daily.    . tamsulosin (FLOMAX) 0.4 MG CAPS capsule Take 1 capsule (0.4 mg total) by mouth daily. (Patient taking differently: Take 0.4 mg by mouth 2 (two) times daily. ) 90 capsule 3  . torsemide (DEMADEX) 20 MG tablet Take 2 tablets (40 mg total) by mouth daily. 60 tablet 0   No current facility-administered medications for this visit.     OBJECTIVE: Vitals:   03/03/18 0952  BP: (!) 147/87  Pulse: 67  Temp: (!) 97.4 F (36.3 C)     Body mass index is 26 kg/m.    ECOG FS:0 - Asymptomatic  General: Well-developed, well-nourished, no acute distress. Eyes: Pink conjunctiva, anicteric sclera. HEENT: Normocephalic, moist mucous membranes. Lungs: Clear to auscultation bilaterally. Heart: Regular rate and rhythm. No rubs, murmurs, or gallops. Abdomen: Soft, nontender, nondistended. No organomegaly noted, normoactive bowel sounds. Musculoskeletal: No edema, cyanosis, or clubbing. Neuro: Alert, answering all questions appropriately. Cranial nerves grossly intact. Skin: No rashes or petechiae noted. Psych: Normal affect.  LAB RESULTS:  Lab Results  Component Value Date   NA 136 02/24/2018   K 5.0 02/24/2018   CL 104 02/24/2018   CO2 24 02/24/2018   GLUCOSE 82 02/24/2018   BUN 65 (H) 02/24/2018   CREATININE 2.73 (H) 02/24/2018   CALCIUM 8.8 (L) 02/24/2018   PROT 7.3 02/17/2018   ALBUMIN 4.1 02/17/2018   AST 12 (L) 02/17/2018   ALT 11 02/17/2018   ALKPHOS 57 02/17/2018   BILITOT 0.9 02/17/2018   GFRNONAA 21 (L) 02/24/2018   GFRAA 25 (L) 02/24/2018    Lab Results  Component Value Date   WBC 5.1 02/24/2018   NEUTROABS 3.8 02/24/2018   HGB 10.8 (L) 02/24/2018   HCT 31.7 (L) 02/24/2018   MCV 96.9 02/24/2018   PLT 183 02/24/2018   Lab Results  Component Value Date   TOTALPROTELP 6.8 02/24/2018   ALBUMINELP 4.0 02/24/2018   A1GS 0.2 02/24/2018   A2GS 0.7 02/24/2018   BETS 0.8 02/24/2018   GAMS 1.1 02/24/2018   MSPIKE 0.6 (H) 02/24/2018   SPEI  Comment 02/24/2018     STUDIES: US Renal  Result Date: 02/17/2018 CLINICAL DATA:  Acute kidney injury EXAM: RENAL / URINARY TRACT ULTRASOUND COMPLETE COMPARISON:  09/11/2016 FINDINGS: Right Kidney: Renal measurements: 10 x 5 x 4 cm. Diffusely increased cortical echogenicity with abnormal corticomedullary differentiation. No hydronephrosis. Lower cortical cysts measuring up to 16 mm, simple appearing Left Kidney: Renal measurements: 10 x 5 x 5 cm. Diffusely increased cortical echogenicity. No hydronephrosis. Bladder: Trabeculated bladder which is moderately distended. No focal wall thickening or internal debris. IMPRESSION: 1. Medical renal disease. 2. Trabeculated bladder suggesting chronic outlet obstruction. Currently there is moderate bladder distention. No hydronephrosis. Electronically Signed   By: Neva Seat.D.  On: 02/17/2018 04:23   Dg Chest Portable 1 View  Result Date: 02/16/2018 CLINICAL DATA:  Near syncope. EXAM: PORTABLE CHEST 1 VIEW COMPARISON:  Chest x-ray dated 08/22/2017. FINDINGS: Borderline cardiomegaly. Lungs are clear. No pleural effusion or pneumothorax seen. Osseous structures about the chest are unremarkable. IMPRESSION: No active disease. No evidence of pneumonia or pulmonary edema. Electronically Signed   By: Franki Cabot M.D.   On: 02/16/2018 23:36    ASSESSMENT: MGUS  PLAN:    1. MGUS: Patient had a bone marrow biopsy on November 03, 2015 that revealed only a mild increased plasma cell population of approximately 5-10%. Patient was also noted to have cytogenetic abnormality of a monosomy 23 which is an average risk of multiple myeloma.  Patient's most recent M spike was reported at 0.6 which is essentially unchanged.  His immunoglobulins are all within normal limits.  He has an elevated kappa and lambda free light chain, but his ratio remains within normal limits.  His renal insufficiency and mild anemia are chronic and unchanged.  Given his bone marrow results,  patient is at mild to moderate risk to progress to multiple myeloma. Will consider a metastatic bone survey in the future if there is suspicion of progression of disease.  Return to clinic in 6 months with repeat laboratory can further evaluation.   2. Renal insufficiency: Creatinine slightly improved today to 2.73.  Continue monitoring and treatment per nephrology. 3.  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  Patient's hemoglobin is decreased, but stable at 10.8.  Patient may benefit from Procrit in the future if his hemoglobin declines and remains below 10.0.   Patient expressed understanding and was in agreement with this plan. He also understands that He can call clinic at any time with any questions, concerns, or complaints.    Lloyd Huger, MD   03/04/2018 3:15 PM

## 2018-03-03 NOTE — Progress Notes (Signed)
Patient is here today to follow up on his MGUS. Patient denied fever, chills, nausea, vomiting, diarrhea or constipation.

## 2018-03-04 ENCOUNTER — Encounter: Payer: Self-pay | Admitting: Oncology

## 2018-03-12 DIAGNOSIS — I4891 Unspecified atrial fibrillation: Secondary | ICD-10-CM | POA: Diagnosis not present

## 2018-03-13 DIAGNOSIS — N184 Chronic kidney disease, stage 4 (severe): Secondary | ICD-10-CM | POA: Diagnosis not present

## 2018-03-20 DIAGNOSIS — R001 Bradycardia, unspecified: Secondary | ICD-10-CM | POA: Diagnosis not present

## 2018-03-20 DIAGNOSIS — I4891 Unspecified atrial fibrillation: Secondary | ICD-10-CM | POA: Diagnosis not present

## 2018-03-27 DIAGNOSIS — I700 Atherosclerosis of aorta: Secondary | ICD-10-CM | POA: Diagnosis not present

## 2018-03-27 DIAGNOSIS — E7800 Pure hypercholesterolemia, unspecified: Secondary | ICD-10-CM | POA: Diagnosis not present

## 2018-03-27 DIAGNOSIS — I129 Hypertensive chronic kidney disease with stage 1 through stage 4 chronic kidney disease, or unspecified chronic kidney disease: Secondary | ICD-10-CM | POA: Diagnosis not present

## 2018-03-27 DIAGNOSIS — N184 Chronic kidney disease, stage 4 (severe): Secondary | ICD-10-CM | POA: Diagnosis not present

## 2018-03-27 DIAGNOSIS — I4821 Permanent atrial fibrillation: Secondary | ICD-10-CM | POA: Diagnosis not present

## 2018-06-12 DIAGNOSIS — D472 Monoclonal gammopathy: Secondary | ICD-10-CM | POA: Diagnosis not present

## 2018-06-12 DIAGNOSIS — R809 Proteinuria, unspecified: Secondary | ICD-10-CM | POA: Diagnosis not present

## 2018-06-12 DIAGNOSIS — N184 Chronic kidney disease, stage 4 (severe): Secondary | ICD-10-CM | POA: Diagnosis not present

## 2018-06-12 DIAGNOSIS — I130 Hypertensive heart and chronic kidney disease with heart failure and stage 1 through stage 4 chronic kidney disease, or unspecified chronic kidney disease: Secondary | ICD-10-CM | POA: Diagnosis not present

## 2018-06-12 DIAGNOSIS — I5020 Unspecified systolic (congestive) heart failure: Secondary | ICD-10-CM | POA: Diagnosis not present

## 2018-06-30 DIAGNOSIS — N184 Chronic kidney disease, stage 4 (severe): Secondary | ICD-10-CM | POA: Diagnosis not present

## 2018-07-14 DIAGNOSIS — Z08 Encounter for follow-up examination after completed treatment for malignant neoplasm: Secondary | ICD-10-CM | POA: Diagnosis not present

## 2018-07-14 DIAGNOSIS — L821 Other seborrheic keratosis: Secondary | ICD-10-CM | POA: Diagnosis not present

## 2018-07-14 DIAGNOSIS — Z85828 Personal history of other malignant neoplasm of skin: Secondary | ICD-10-CM | POA: Diagnosis not present

## 2018-07-14 DIAGNOSIS — Z85820 Personal history of malignant melanoma of skin: Secondary | ICD-10-CM | POA: Diagnosis not present

## 2018-07-14 DIAGNOSIS — B078 Other viral warts: Secondary | ICD-10-CM | POA: Diagnosis not present

## 2018-07-14 DIAGNOSIS — D485 Neoplasm of uncertain behavior of skin: Secondary | ICD-10-CM | POA: Diagnosis not present

## 2018-07-14 DIAGNOSIS — L438 Other lichen planus: Secondary | ICD-10-CM | POA: Diagnosis not present

## 2018-08-20 DIAGNOSIS — I4891 Unspecified atrial fibrillation: Secondary | ICD-10-CM | POA: Diagnosis not present

## 2018-08-20 DIAGNOSIS — Z8679 Personal history of other diseases of the circulatory system: Secondary | ICD-10-CM | POA: Diagnosis not present

## 2018-08-20 DIAGNOSIS — E7800 Pure hypercholesterolemia, unspecified: Secondary | ICD-10-CM | POA: Diagnosis not present

## 2018-08-27 DIAGNOSIS — I700 Atherosclerosis of aorta: Secondary | ICD-10-CM | POA: Diagnosis not present

## 2018-08-27 DIAGNOSIS — N184 Chronic kidney disease, stage 4 (severe): Secondary | ICD-10-CM | POA: Diagnosis not present

## 2018-08-27 DIAGNOSIS — Z0000 Encounter for general adult medical examination without abnormal findings: Secondary | ICD-10-CM | POA: Diagnosis not present

## 2018-08-27 DIAGNOSIS — I4821 Permanent atrial fibrillation: Secondary | ICD-10-CM | POA: Diagnosis not present

## 2018-08-27 DIAGNOSIS — I129 Hypertensive chronic kidney disease with stage 1 through stage 4 chronic kidney disease, or unspecified chronic kidney disease: Secondary | ICD-10-CM | POA: Diagnosis not present

## 2018-08-27 DIAGNOSIS — I5022 Chronic systolic (congestive) heart failure: Secondary | ICD-10-CM | POA: Diagnosis not present

## 2018-08-27 DIAGNOSIS — E7800 Pure hypercholesterolemia, unspecified: Secondary | ICD-10-CM | POA: Diagnosis not present

## 2018-09-03 ENCOUNTER — Other Ambulatory Visit: Payer: Self-pay

## 2018-09-03 ENCOUNTER — Inpatient Hospital Stay: Payer: PPO | Attending: Oncology

## 2018-09-03 DIAGNOSIS — N400 Enlarged prostate without lower urinary tract symptoms: Secondary | ICD-10-CM | POA: Diagnosis not present

## 2018-09-03 DIAGNOSIS — E7800 Pure hypercholesterolemia, unspecified: Secondary | ICD-10-CM | POA: Diagnosis not present

## 2018-09-03 DIAGNOSIS — Z79899 Other long term (current) drug therapy: Secondary | ICD-10-CM | POA: Diagnosis not present

## 2018-09-03 DIAGNOSIS — I10 Essential (primary) hypertension: Secondary | ICD-10-CM | POA: Insufficient documentation

## 2018-09-03 DIAGNOSIS — Z85820 Personal history of malignant melanoma of skin: Secondary | ICD-10-CM | POA: Diagnosis not present

## 2018-09-03 DIAGNOSIS — D649 Anemia, unspecified: Secondary | ICD-10-CM | POA: Insufficient documentation

## 2018-09-03 DIAGNOSIS — I4891 Unspecified atrial fibrillation: Secondary | ICD-10-CM | POA: Diagnosis not present

## 2018-09-03 DIAGNOSIS — D472 Monoclonal gammopathy: Secondary | ICD-10-CM

## 2018-09-03 DIAGNOSIS — N189 Chronic kidney disease, unspecified: Secondary | ICD-10-CM | POA: Insufficient documentation

## 2018-09-03 DIAGNOSIS — Z7901 Long term (current) use of anticoagulants: Secondary | ICD-10-CM | POA: Diagnosis not present

## 2018-09-03 DIAGNOSIS — Z87891 Personal history of nicotine dependence: Secondary | ICD-10-CM | POA: Insufficient documentation

## 2018-09-03 LAB — CBC WITH DIFFERENTIAL/PLATELET
Abs Immature Granulocytes: 0.01 10*3/uL (ref 0.00–0.07)
Basophils Absolute: 0 10*3/uL (ref 0.0–0.1)
Basophils Relative: 0 %
Eosinophils Absolute: 0.2 10*3/uL (ref 0.0–0.5)
Eosinophils Relative: 4 %
HCT: 31.2 % — ABNORMAL LOW (ref 39.0–52.0)
Hemoglobin: 10.3 g/dL — ABNORMAL LOW (ref 13.0–17.0)
Immature Granulocytes: 0 %
Lymphocytes Relative: 13 %
Lymphs Abs: 0.6 10*3/uL — ABNORMAL LOW (ref 0.7–4.0)
MCH: 32.8 pg (ref 26.0–34.0)
MCHC: 33 g/dL (ref 30.0–36.0)
MCV: 99.4 fL (ref 80.0–100.0)
Monocytes Absolute: 0.3 10*3/uL (ref 0.1–1.0)
Monocytes Relative: 6 %
Neutro Abs: 3.8 10*3/uL (ref 1.7–7.7)
Neutrophils Relative %: 77 %
Platelets: 173 10*3/uL (ref 150–400)
RBC: 3.14 MIL/uL — ABNORMAL LOW (ref 4.22–5.81)
RDW: 12.7 % (ref 11.5–15.5)
WBC: 5 10*3/uL (ref 4.0–10.5)
nRBC: 0 % (ref 0.0–0.2)

## 2018-09-03 LAB — BASIC METABOLIC PANEL
Anion gap: 9 (ref 5–15)
BUN: 71 mg/dL — ABNORMAL HIGH (ref 8–23)
CO2: 23 mmol/L (ref 22–32)
Calcium: 8.9 mg/dL (ref 8.9–10.3)
Chloride: 105 mmol/L (ref 98–111)
Creatinine, Ser: 3.43 mg/dL — ABNORMAL HIGH (ref 0.61–1.24)
GFR calc Af Amer: 19 mL/min — ABNORMAL LOW (ref >60)
GFR calc non Af Amer: 16 mL/min — ABNORMAL LOW (ref >60)
Glucose, Bld: 97 mg/dL (ref 70–99)
Potassium: 6 mmol/L — ABNORMAL HIGH (ref 3.5–5.1)
Sodium: 137 mmol/L (ref 135–145)

## 2018-09-04 LAB — PROTEIN ELECTROPHORESIS, SERUM
A/G Ratio: 1.4 (ref 0.7–1.7)
Albumin ELP: 3.9 g/dL (ref 2.9–4.4)
Alpha-1-Globulin: 0.2 g/dL (ref 0.0–0.4)
Alpha-2-Globulin: 0.6 g/dL (ref 0.4–1.0)
Beta Globulin: 0.8 g/dL (ref 0.7–1.3)
Gamma Globulin: 1.1 g/dL (ref 0.4–1.8)
Globulin, Total: 2.7 g/dL (ref 2.2–3.9)
M-Spike, %: 0.4 g/dL — ABNORMAL HIGH
Total Protein ELP: 6.6 g/dL (ref 6.0–8.5)

## 2018-09-04 LAB — KAPPA/LAMBDA LIGHT CHAINS
Kappa free light chain: 124.2 mg/L — ABNORMAL HIGH (ref 3.3–19.4)
Kappa, lambda light chain ratio: 0.57 (ref 0.26–1.65)
Lambda free light chains: 219.3 mg/L — ABNORMAL HIGH (ref 5.7–26.3)

## 2018-09-04 LAB — IGG, IGA, IGM
IgA: 327 mg/dL (ref 61–437)
IgG (Immunoglobin G), Serum: 1160 mg/dL (ref 603–1613)
IgM (Immunoglobulin M), Srm: 41 mg/dL (ref 15–143)

## 2018-09-05 NOTE — Progress Notes (Signed)
[place]  Telephone:([phone] [phone]  ID: Lawrence Perkins. OB: 1940/02/12  MR#: [id number]  LFY#:[id number]  Patient Care Team: Kirk Ruths, MD as PCP - General (Internal Medicine)  CHIEF COMPLAINT: MGUS.  INTERVAL HISTORY: Patient returns to clinic today for repeat laboratory work and routine 48-monthevaluation.  He continues to feel well and remains asymptomatic. He has no neurologic complaints.  He denies any pain. He has a good appetite and denies weight loss.  He denies any chest pain, shortness of breath, cough, or hemoptysis.  He denies any nausea, vomiting, constipation, or diarrhea. He has no urinary complaints.  Patient feels at his baseline offers no specific complaints today.  REVIEW OF SYSTEMS:   Review of Systems  Constitutional: Negative.  Negative for fever, malaise/fatigue and weight loss.  Respiratory: Negative.  Negative for cough and shortness of breath.   Cardiovascular: Negative.  Negative for chest pain and leg swelling.  Gastrointestinal: Negative.  Negative for abdominal pain.  Genitourinary: Negative.  Negative for dysuria.  Musculoskeletal: Negative.  Negative for back pain.  Skin: Negative.  Negative for rash.  Neurological: Negative.  Negative for focal weakness, weakness and headaches.  Endo/Heme/Allergies: Does not bruise/bleed easily.  Psychiatric/Behavioral: Negative.  The patient is not nervous/anxious.     As per HPI. Otherwise, a complete review of systems is negative.  PAST MEDICAL HISTORY: Past Medical History:  Diagnosis Date  . BPH (benign prostatic hyperplasia)   . [place]   . CH[place] ([place]estive heart failure) ([place]   . Chronic kidney disease   . Complication of anesthesia    has converted to afib during and/or after surgery in the past  . Diverticulosis   . Dysrhythmia    afib  . Gout   . History of colon polyps   . Hypercholesteremia   . Hypertension   . Melanoma ([place]     status post resection from his back  . MGUS (monoclonal gammopathy of unknown significance)   . Pleural effusion   . Prostatism   . Rectal bleeding     PAST SURGICAL HISTORY: Past Surgical History:  Procedure Laterality Date  . ANKLE SURGERY     following volleyball injury  . APPENDECTOMY    . COLONOSCOPY    . COLONOSCOPY WITH PROPOFOL N/A 01/02/2017   Procedure: COLONOSCOPY WITH PROPOFOL;  Surgeon: EManya Silvas MD;  [facility];  Service: Endoscopy;  Laterality: N/A;  . JOINT REPLACEMENT     RIGHT TOTAL HIP  . TONSILLECTOMY    . TOTAL HIP ARTHROPLASTY  09/2009   Right femoral neck fracture  . TOTAL HIP ARTHROPLASTY Left 03/06/2016   Procedure: TOTAL HIP ARTHROPLASTY ANTERIOR APPROACH;  Surgeon: MHessie Knows MD;  Location: [facility] ORS;  Service: Orthopedics;  Laterality: Left;    FAMILY HISTORY: Family History  Problem Relation Age of Onset  . Cirrhosis Mother   . Other Father        Lung Fibrosis  . Prostate cancer Neg Hx   . Kidney cancer Neg Hx   . Bladder Cancer Neg Hx     ADVANCED DIRECTIVES (Y/N):  N  HEALTH MAINTENANCE: Social History   Tobacco Use  . Smoking status: Former Smoker    Years: 15.00    Quit date: 01/01/1981    Years since quitting: 37.7  . Smokeless tobacco: Never Used  Substance Use Topics  . Alcohol use: Yes    Alcohol/week: 2.0 standard drinks    Types: 2 Cans  of beer per week  . Drug use: No     Colonoscopy:  PAP:  Bone density:  Lipid panel:  No Known Allergies  Current Outpatient Medications  Medication Sig Dispense Refill  . amLODipine (NORVASC) 10 MG tablet Take 10 mg by mouth daily.    [name] apixaban (ELIQUIS) 5 MG TABS tablet Take 5 mg by mouth 2 (two) times daily.    . carvedilol (COREG) 6.25 MG tablet Take 6.25 mg by mouth 2 (two) times daily with a meal.    . Cholecalciferol (VITAMIN D3) 2000 units capsule Take by mouth.    . rosuvastatin (CRESTOR) 10 MG tablet Take 10 mg by mouth daily.    . tamsulosin  (FLOMAX) 0.4 MG CAPS capsule Take 1 capsule (0.4 mg total) by mouth daily. (Patient taking differently: Take 0.4 mg by mouth 2 (two) times daily. ) 90 capsule 3  . torsemide (DEMADEX) 20 MG tablet Take 2 tablets (40 mg total) by mouth daily. 60 tablet 0   No current facility-administered medications for this visit.     OBJECTIVE: Vitals:   09/11/18 1006  BP: 120/63  Pulse: (!) 49  Resp: 18  Temp: (!) 96.7 F (35.9 C)     Body mass index is 25.94 kg/m.    ECOG FS:0 - Asymptomatic  General: Well-developed, well-nourished, no acute distress. Eyes: Pink conjunctiva, anicteric sclera. HEENT: Normocephalic, moist mucous membranes. Lungs: Clear to auscultation bilaterally. Heart: Regular rate and rhythm. No rubs, murmurs, or gallops. Abdomen: Soft, nontender, nondistended. No organomegaly noted, normoactive bowel sounds. Musculoskeletal: No edema, cyanosis, or clubbing. Neuro: Alert, answering all questions appropriately. Cranial nerves grossly intact. Skin: No rashes or petechiae noted. Psych: Normal affect.  LAB RESULTS:  Lab Results  Component Value Date   NA 137 09/03/2018   K 6.0 (H) 09/03/2018   CL 105 09/03/2018   CO2 23 09/03/2018   GLUCOSE 97 09/03/2018   BUN 71 (H) 09/03/2018   CREATININE 3.43 (H) 09/03/2018   CALCIUM 8.9 09/03/2018   PROT 7.3 02/17/2018   ALBUMIN 4.1 02/17/2018   AST 12 (L) 02/17/2018   ALT 11 02/17/2018   ALKPHOS 57 02/17/2018   BILITOT 0.9 02/17/2018   GFRNONAA 16 (L) 09/03/2018   GFRAA 19 (L) 09/03/2018    Lab Results  Component Value Date   WBC 5.0 09/03/2018   NEUTROABS 3.8 09/03/2018   HGB 10.3 (L) 09/03/2018   HCT 31.2 (L) 09/03/2018   MCV 99.4 09/03/2018   PLT 173 09/03/2018   Lab Results  Component Value Date   TOTALPROTELP 6.6 09/03/2018   ALBUMINELP 3.9 09/03/2018   A1GS 0.2 09/03/2018   A2GS 0.6 09/03/2018   BETS 0.8 09/03/2018   GAMS 1.1 09/03/2018   MSPIKE 0.4 (H) 09/03/2018   SPEI Comment 09/03/2018      STUDIES: No results found.  ASSESSMENT: MGUS  PLAN:    1. MGUS: Patient had a bone marrow biopsy on November 03, 2015 that revealed only a mild increased plasma cell population of approximately 5-10%. Patient was also noted to have cytogenetic abnormality of a monosomy 41 which is an average risk of multiple myeloma.  Patient's most recent M spike is 0.4 which is relatively unchanged.  Immunoglobulins continue to be within normal limits.  Both kappa and lambda free light chains are elevated, but his ratio is normal.  His creatinine is slightly worse and this is being followed by nephrology.  He continues to have a mild anemia.  Given his bone marrow results,  patient is at mild to moderate risk to progress to multiple myeloma. Will consider a metastatic bone survey in the future if there is suspicion of progression of disease.  Return to clinic in 6 months for repeat laboratory work only and then in 1 year for repeat laboratory work and further evaluation. 2. Renal insufficiency: Creatinine worse today.  Continue monitoring and treatment per [facility] nephrology. 3.  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  Hemoglobin is trended down slightly to 10.3.  Patient may benefit from Retacrit in the future if his hemoglobin continues to decrease and remains persistently below 10.0.  Patient expressed understanding and was in agreement with this plan. He also understands that He can call clinic at any time with any questions, concerns, or complaints.    Lloyd Huger, MD   09/11/2018 6:55 PM

## 2018-09-09 ENCOUNTER — Ambulatory Visit: Payer: PPO | Admitting: Oncology

## 2018-09-10 ENCOUNTER — Other Ambulatory Visit: Payer: Self-pay

## 2018-09-10 ENCOUNTER — Encounter: Payer: Self-pay | Admitting: Oncology

## 2018-09-10 NOTE — Progress Notes (Signed)
Pre screening completed no complaints today. 

## 2018-09-11 ENCOUNTER — Other Ambulatory Visit: Payer: Self-pay

## 2018-09-11 ENCOUNTER — Inpatient Hospital Stay (HOSPITAL_BASED_OUTPATIENT_CLINIC_OR_DEPARTMENT_OTHER): Payer: PPO | Admitting: Oncology

## 2018-09-11 VITALS — BP 120/63 | HR 49 | Temp 96.7°F | Resp 18 | Wt 186.0 lb

## 2018-09-11 DIAGNOSIS — D472 Monoclonal gammopathy: Secondary | ICD-10-CM | POA: Diagnosis not present

## 2018-09-25 DIAGNOSIS — I5022 Chronic systolic (congestive) heart failure: Secondary | ICD-10-CM | POA: Diagnosis not present

## 2018-09-25 DIAGNOSIS — N184 Chronic kidney disease, stage 4 (severe): Secondary | ICD-10-CM | POA: Diagnosis not present

## 2018-09-25 DIAGNOSIS — I2510 Atherosclerotic heart disease of native coronary artery without angina pectoris: Secondary | ICD-10-CM | POA: Diagnosis not present

## 2018-09-25 DIAGNOSIS — I482 Chronic atrial fibrillation, unspecified: Secondary | ICD-10-CM | POA: Diagnosis not present

## 2018-09-25 DIAGNOSIS — I129 Hypertensive chronic kidney disease with stage 1 through stage 4 chronic kidney disease, or unspecified chronic kidney disease: Secondary | ICD-10-CM | POA: Diagnosis not present

## 2018-10-16 ENCOUNTER — Other Ambulatory Visit: Payer: Self-pay

## 2018-10-16 DIAGNOSIS — Z20828 Contact with and (suspected) exposure to other viral communicable diseases: Secondary | ICD-10-CM | POA: Diagnosis not present

## 2018-10-16 DIAGNOSIS — Z20822 Contact with and (suspected) exposure to covid-19: Secondary | ICD-10-CM

## 2018-10-18 LAB — NOVEL CORONAVIRUS, NAA: SARS-CoV-2, NAA: NOT DETECTED

## 2018-10-28 DIAGNOSIS — D472 Monoclonal gammopathy: Secondary | ICD-10-CM | POA: Diagnosis not present

## 2018-10-28 DIAGNOSIS — N401 Enlarged prostate with lower urinary tract symptoms: Secondary | ICD-10-CM | POA: Diagnosis not present

## 2018-10-28 DIAGNOSIS — I10 Essential (primary) hypertension: Secondary | ICD-10-CM | POA: Diagnosis not present

## 2018-10-28 DIAGNOSIS — R809 Proteinuria, unspecified: Secondary | ICD-10-CM | POA: Diagnosis not present

## 2018-10-28 DIAGNOSIS — N184 Chronic kidney disease, stage 4 (severe): Secondary | ICD-10-CM | POA: Diagnosis not present

## 2018-10-28 DIAGNOSIS — I4891 Unspecified atrial fibrillation: Secondary | ICD-10-CM | POA: Diagnosis not present

## 2018-12-01 DIAGNOSIS — N184 Chronic kidney disease, stage 4 (severe): Secondary | ICD-10-CM | POA: Diagnosis not present

## 2019-01-06 ENCOUNTER — Telehealth: Payer: Self-pay | Admitting: Urology

## 2019-01-06 NOTE — Telephone Encounter (Signed)
What medication is Mr. Hanisch needed a refill on and has the new pharmacy been updated in the chart?

## 2019-01-06 NOTE — Telephone Encounter (Signed)
Pt. Insurance and preferred Pharmacy changed. It is updated in system. New Pharmacy is [facility]. [facility] be sent E[facility]. Pt. States he has 1 week and 5 day supply left before he runs out of this medication

## 2019-01-06 NOTE — Telephone Encounter (Signed)
Spoke to patient and he wanted the Torsemide refilled. I informed him that he will need to contact Dr. Brett Albino to have that RX refilled. Patient voiced understanding.

## 2019-01-09 ENCOUNTER — Other Ambulatory Visit: Payer: Self-pay | Admitting: Family Medicine

## 2019-01-09 MED ORDER — TAMSULOSIN HCL 0.4 MG PO CAPS: 0.4000 mg | ORAL_CAPSULE | Freq: Every day | ORAL | 3 refills | Status: DC

## 2019-01-12 DIAGNOSIS — H353131 Nonexudative age-related macular degeneration, bilateral, early dry stage: Secondary | ICD-10-CM | POA: Diagnosis not present

## 2019-01-12 DIAGNOSIS — D3131 Benign neoplasm of right choroid: Secondary | ICD-10-CM | POA: Diagnosis not present

## 2019-01-12 DIAGNOSIS — H2513 Age-related nuclear cataract, bilateral: Secondary | ICD-10-CM | POA: Diagnosis not present

## 2019-01-12 DIAGNOSIS — H35039 Hypertensive retinopathy, unspecified eye: Secondary | ICD-10-CM | POA: Diagnosis not present

## 2019-01-22 DIAGNOSIS — Z85820 Personal history of malignant melanoma of skin: Secondary | ICD-10-CM | POA: Diagnosis not present

## 2019-01-22 DIAGNOSIS — D2261 Melanocytic nevi of right upper limb, including shoulder: Secondary | ICD-10-CM | POA: Diagnosis not present

## 2019-01-22 DIAGNOSIS — Z85828 Personal history of other malignant neoplasm of skin: Secondary | ICD-10-CM | POA: Diagnosis not present

## 2019-01-22 DIAGNOSIS — D2262 Melanocytic nevi of left upper limb, including shoulder: Secondary | ICD-10-CM | POA: Diagnosis not present

## 2019-01-22 DIAGNOSIS — L821 Other seborrheic keratosis: Secondary | ICD-10-CM | POA: Diagnosis not present

## 2019-01-22 DIAGNOSIS — D2271 Melanocytic nevi of right lower limb, including hip: Secondary | ICD-10-CM | POA: Diagnosis not present

## 2019-01-27 ENCOUNTER — Ambulatory Visit: Payer: Medicare HMO | Admitting: Urology

## 2019-01-27 ENCOUNTER — Other Ambulatory Visit: Payer: Self-pay

## 2019-01-27 ENCOUNTER — Encounter: Payer: Self-pay | Admitting: Urology

## 2019-01-27 VITALS — BP 110/65 | HR 69 | Ht 71.0 in | Wt 188.0 lb

## 2019-01-27 DIAGNOSIS — N401 Enlarged prostate with lower urinary tract symptoms: Secondary | ICD-10-CM | POA: Diagnosis not present

## 2019-01-27 DIAGNOSIS — N138 Other obstructive and reflux uropathy: Secondary | ICD-10-CM

## 2019-01-27 LAB — BLADDER SCAN AMB NON-IMAGING: Scan Result: 255

## 2019-01-27 NOTE — Progress Notes (Signed)
01/24/2018 8:42 AM   Lawrence Perkins. 02/08/1940 [id number]  Referring provider: Kirk Ruths, MD [facility] [place],  [place] [address]  Chief Complaint  Patient presents with  . Benign Prostatic Hypertrophy    HPI: Lawrence Perkins. is a 79 y.o. male Caucasian with BPH with LU TS who presents today for an annual exam.  BPH WITH LUTS  (prostate and/or bladder) IPSS score: 12/2    PVR:  255 mL     Previous score: 7/2   Major complaint(s): Weak stream x several years.  Denies any dysuria, hematuria or suprapubic pain.   Currently taking: tamsulosin 0.4 mg daily.  Denies any recent fevers, chills, nausea or vomiting.  He does not have a family history of PCa.  IPSS    Row Name 01/27/19 0800         International Prostate Symptom Score   How often have you had the sensation of not emptying your bladder?  Not at All     How often have you had to urinate less than every two hours?  More than half the time     How often have you found you stopped and started again several times when you urinated?  Not at All     How often have you found it difficult to postpone urination?  Less than half the time     How often have you had a weak urinary stream?  Almost always     How often have you had to strain to start urination?  Not at All     How many times did you typically get up at night to urinate?  1 Time     Total IPSS Score  12       Quality of Life due to urinary symptoms   If you were to spend the rest of your life with your urinary condition just the way it is now how would you feel about that?  Mostly Satisfied        Score:  1-7 Mild 8-19 Moderate 20-35 Severe  PMH: Past Medical History:  Diagnosis Date  . BPH (benign prostatic hyperplasia)   . Cardiomyopathy ([place])   . CHF (congestive heart failure) ([place])   . Chronic kidney disease   . Complication of anesthesia    has converted to afib during and/or after  surgery in the past  . Diverticulosis   . Dysrhythmia    afib  . Gout   . History of colon polyps   . Hypercholesteremia   . Hypertension   . Melanoma ([place])    status post resection from his back  . MGUS (monoclonal gammopathy of unknown significance)   . Pleural effusion   . Prostatism   . Rectal bleeding     Surgical History: Past Surgical History:  Procedure Laterality Date  . ANKLE SURGERY     following volleyball injury  . APPENDECTOMY    . COLONOSCOPY    . COLONOSCOPY WITH PROPOFOL N/A 01/02/2017   Procedure: COLONOSCOPY WITH PROPOFOL;  Surgeon: Manya Silvas, MD;  Location: [facility];  Service: Endoscopy;  Laterality: N/A;  . JOINT REPLACEMENT     RIGHT TOTAL HIP  . TONSILLECTOMY    . TOTAL HIP ARTHROPLASTY  09/2009   Right femoral neck fracture  . TOTAL HIP ARTHROPLASTY Left 03/06/2016   Procedure: TOTAL HIP ARTHROPLASTY ANTERIOR APPROACH;  Surgeon: Hessie Knows, MD;  Location: [facility] ORS;  Service: Orthopedics;  Laterality: Left;    Home Medications:  Allergies as of 01/27/2019   No Known Allergies     Medication List       Accurate as of January 27, 2019  8:42 AM. If you have any questions, ask your nurse or doctor.        amLODipine 10 MG tablet Commonly known as: NORVASC Take 10 mg by mouth daily.   carvedilol 6.25 MG tablet Commonly known as: COREG Take 6.25 mg by mouth 2 (two) times daily with a meal.   Eliquis 5 MG Tabs tablet Generic drug: apixaban Take 5 mg by mouth 2 (two) times daily.   enalapril 5 MG tablet Commonly known as: VASOTEC Take by mouth.   rosuvastatin 10 MG tablet Commonly known as: CRESTOR Take 10 mg by mouth daily.   tamsulosin 0.4 MG Caps capsule Commonly known as: FLOMAX Take 1 capsule (0.4 mg total) by mouth daily.   torsemide 20 MG tablet Commonly known as: DEMADEX Take 2 tablets (40 mg total) by mouth daily.   Vitamin D3 50 MCG (2000 UT) capsule Take by mouth.       Allergies: No Known  Allergies  Family History: Family History  Problem Relation Age of Onset  . Cirrhosis Mother   . Other Father        Lung Fibrosis  . Prostate cancer Neg Hx   . Kidney cancer Neg Hx   . Bladder Cancer Neg Hx     Social History:  reports that he quit smoking about 38 years ago. He quit after 15.00 years of use. He has never used smokeless tobacco. He reports current alcohol use of about 2.0 standard drinks of alcohol per week. He reports that he does not use drugs.  ROS: UROLOGY Frequent Urination?: No Hard to postpone urination?: No Burning/pain with urination?: No Get up at night to urinate?: No Leakage of urine?: No Urine stream starts and stops?: No Trouble starting stream?: No Do you have to strain to urinate?: No Blood in urine?: No Urinary tract infection?: No Sexually transmitted disease?: No Injury to kidneys or bladder?: No Painful intercourse?: No Weak stream?: No Erection problems?: No Penile pain?: No  Gastrointestinal Nausea?: No Vomiting?: No Indigestion/heartburn?: No Diarrhea?: No Constipation?: No  Constitutional Fever: No Night sweats?: No Weight loss?: No Fatigue?: No  Skin Skin rash/lesions?: No Itching?: No  Eyes Blurred vision?: No Double vision?: No  Ears/Nose/Throat Sore throat?: No Sinus problems?: No  Hematologic/Lymphatic Swollen glands?: No Easy bruising?: No  Cardiovascular Leg swelling?: No Chest pain?: No  Respiratory Cough?: No Shortness of breath?: No  Endocrine Excessive thirst?: No  Musculoskeletal Back pain?: No Joint pain?: No  Neurological Headaches?: No Dizziness?: No  Psychologic Depression?: No Anxiety?: No  Physical Exam: BP 110/65   Pulse 69   Ht 5\' 11"  (1.803 m)   Wt 188 lb (85.3 kg)   BMI 26.22 kg/m   Constitutional:  Well nourished. Alert and oriented, No acute distress. HEENT: [place] AT, mask in place.  Trachea midline, no masses. Cardiovascular: No clubbing, cyanosis, or  edema. Respiratory: Normal respiratory effort, no increased work of breathing. GI: Abdomen is soft, non tender, non distended, no abdominal masses. Liver and spleen not palpable.  No hernias appreciated.  Stool sample for occult testing is not indicated.   GU: No CVA tenderness.  No bladder fullness or masses.  Patient with circumcised phallus.  Urethral meatus is patent.  No penile discharge. No penile lesions or  rashes. Scrotum without lesions, cysts, rashes and/or edema.  Testicles are located scrotally bilaterally. No masses are appreciated in the testicles. Left and right epididymis are normal. Rectal: Patient with  normal sphincter tone. Anus and perineum without scarring or rashes. No rectal masses are appreciated. Prostate is approximately 60 grams, could only palpate the apex and midportion of the gland, flat, no nodules are appreciated. Seminal vesicles could not be palpated.  Skin: No rashes, bruises or suspicious lesions. Lymph: No inguinal adenopathy. Neurologic: Grossly intact, no focal deficits, moving all 4 extremities. Psychiatric: Normal mood and affect.   Laboratory Data: Lab Results  Component Value Date   WBC 5.0 09/03/2018   HGB 10.3 (L) 09/03/2018   HCT 31.2 (L) 09/03/2018   MCV 99.4 09/03/2018   PLT 173 09/03/2018    Lab Results  Component Value Date   CREATININE 3.43 (H) 09/03/2018   I have reviewed the labs.  Assessment & Plan:    1. BPH with LUTS - IPSS score is 12/2, it is worsened - Continue conservative management, avoiding bladder irritants and timed voiding's - dicussed pursuing bladder outlet procedure, but he declined - Continue tamsulosin 0.4 mg daily; refills given - RTC in 12 months for IPSS and exam  Return in about 1 year (around 01/27/2020) for I PSS, PVR and exam .  These notes generated with voice recognition software. I apologize for typographical errors.  Zara Council, PA-C  [facility] [address], [place],  [address] [phone]

## 2019-02-20 DIAGNOSIS — E7800 Pure hypercholesterolemia, unspecified: Secondary | ICD-10-CM | POA: Diagnosis not present

## 2019-02-20 DIAGNOSIS — I4821 Permanent atrial fibrillation: Secondary | ICD-10-CM | POA: Diagnosis not present

## 2019-02-20 DIAGNOSIS — I129 Hypertensive chronic kidney disease with stage 1 through stage 4 chronic kidney disease, or unspecified chronic kidney disease: Secondary | ICD-10-CM | POA: Diagnosis not present

## 2019-02-20 DIAGNOSIS — N184 Chronic kidney disease, stage 4 (severe): Secondary | ICD-10-CM | POA: Diagnosis not present

## 2019-02-26 DIAGNOSIS — Z7901 Long term (current) use of anticoagulants: Secondary | ICD-10-CM | POA: Diagnosis not present

## 2019-02-26 DIAGNOSIS — I5022 Chronic systolic (congestive) heart failure: Secondary | ICD-10-CM | POA: Diagnosis not present

## 2019-02-26 DIAGNOSIS — I482 Chronic atrial fibrillation, unspecified: Secondary | ICD-10-CM | POA: Diagnosis not present

## 2019-02-26 DIAGNOSIS — I130 Hypertensive heart and chronic kidney disease with heart failure and stage 1 through stage 4 chronic kidney disease, or unspecified chronic kidney disease: Secondary | ICD-10-CM | POA: Diagnosis not present

## 2019-02-26 DIAGNOSIS — I700 Atherosclerosis of aorta: Secondary | ICD-10-CM | POA: Diagnosis not present

## 2019-02-26 DIAGNOSIS — Z87891 Personal history of nicotine dependence: Secondary | ICD-10-CM | POA: Diagnosis not present

## 2019-02-26 DIAGNOSIS — N184 Chronic kidney disease, stage 4 (severe): Secondary | ICD-10-CM | POA: Diagnosis not present

## 2019-02-26 DIAGNOSIS — I2510 Atherosclerotic heart disease of native coronary artery without angina pectoris: Secondary | ICD-10-CM | POA: Diagnosis not present

## 2019-03-10 ENCOUNTER — Other Ambulatory Visit: Payer: Self-pay | Admitting: Emergency Medicine

## 2019-03-10 DIAGNOSIS — D472 Monoclonal gammopathy: Secondary | ICD-10-CM

## 2019-03-11 ENCOUNTER — Other Ambulatory Visit: Payer: Self-pay

## 2019-03-11 ENCOUNTER — Inpatient Hospital Stay: Payer: Medicare HMO | Attending: Oncology

## 2019-03-11 DIAGNOSIS — Z79899 Other long term (current) drug therapy: Secondary | ICD-10-CM | POA: Diagnosis not present

## 2019-03-11 DIAGNOSIS — D649 Anemia, unspecified: Secondary | ICD-10-CM | POA: Diagnosis not present

## 2019-03-11 DIAGNOSIS — Z87891 Personal history of nicotine dependence: Secondary | ICD-10-CM | POA: Diagnosis not present

## 2019-03-11 DIAGNOSIS — D472 Monoclonal gammopathy: Secondary | ICD-10-CM | POA: Diagnosis not present

## 2019-03-11 LAB — CBC WITH DIFFERENTIAL/PLATELET
Abs Immature Granulocytes: 0.02 10*3/uL (ref 0.00–0.07)
Basophils Absolute: 0 10*3/uL (ref 0.0–0.1)
Basophils Relative: 0 %
Eosinophils Absolute: 0.2 10*3/uL (ref 0.0–0.5)
Eosinophils Relative: 3 %
HCT: 30.3 % — ABNORMAL LOW (ref 39.0–52.0)
Hemoglobin: 9.7 g/dL — ABNORMAL LOW (ref 13.0–17.0)
Immature Granulocytes: 0 %
Lymphocytes Relative: 15 %
Lymphs Abs: 0.8 10*3/uL (ref 0.7–4.0)
MCH: 33.1 pg (ref 26.0–34.0)
MCHC: 32 g/dL (ref 30.0–36.0)
MCV: 103.4 fL — ABNORMAL HIGH (ref 80.0–100.0)
Monocytes Absolute: 0.4 10*3/uL (ref 0.1–1.0)
Monocytes Relative: 7 %
Neutro Abs: 3.9 10*3/uL (ref 1.7–7.7)
Neutrophils Relative %: 75 %
Platelets: 185 10*3/uL (ref 150–400)
RBC: 2.93 MIL/uL — ABNORMAL LOW (ref 4.22–5.81)
RDW: 12.8 % (ref 11.5–15.5)
WBC: 5.3 10*3/uL (ref 4.0–10.5)
nRBC: 0 % (ref 0.0–0.2)

## 2019-03-11 LAB — BASIC METABOLIC PANEL
Anion gap: 10 (ref 5–15)
BUN: 59 mg/dL — ABNORMAL HIGH (ref 8–23)
CO2: 25 mmol/L (ref 22–32)
Calcium: 8.9 mg/dL (ref 8.9–10.3)
Chloride: 102 mmol/L (ref 98–111)
Creatinine, Ser: 3.05 mg/dL — ABNORMAL HIGH (ref 0.61–1.24)
GFR calc Af Amer: 21 mL/min — ABNORMAL LOW (ref >60)
GFR calc non Af Amer: 19 mL/min — ABNORMAL LOW (ref >60)
Glucose, Bld: 92 mg/dL (ref 70–99)
Potassium: 4.6 mmol/L (ref 3.5–5.1)
Sodium: 137 mmol/L (ref 135–145)

## 2019-03-12 LAB — IGG, IGA, IGM
IgA: 345 mg/dL (ref 61–437)
IgG (Immunoglobin G), Serum: 1023 mg/dL (ref 603–1613)
IgM (Immunoglobulin M), Srm: 39 mg/dL (ref 15–143)

## 2019-03-12 LAB — PROTEIN ELECTROPHORESIS, SERUM
A/G Ratio: 1.2 (ref 0.7–1.7)
Albumin ELP: 3.5 g/dL (ref 2.9–4.4)
Alpha-1-Globulin: 0.3 g/dL (ref 0.0–0.4)
Alpha-2-Globulin: 0.6 g/dL (ref 0.4–1.0)
Beta Globulin: 0.9 g/dL (ref 0.7–1.3)
Gamma Globulin: 1.2 g/dL (ref 0.4–1.8)
Globulin, Total: 3 g/dL (ref 2.2–3.9)
M-Spike, %: 0.5 g/dL — ABNORMAL HIGH
Total Protein ELP: 6.5 g/dL (ref 6.0–8.5)

## 2019-03-12 LAB — KAPPA/LAMBDA LIGHT CHAINS
Kappa free light chain: 119 mg/L — ABNORMAL HIGH (ref 3.3–19.4)
Kappa, lambda light chain ratio: 0.51 (ref 0.26–1.65)
Lambda free light chains: 231.5 mg/L — ABNORMAL HIGH (ref 5.7–26.3)

## 2019-03-24 DIAGNOSIS — R55 Syncope and collapse: Secondary | ICD-10-CM | POA: Diagnosis not present

## 2019-03-24 DIAGNOSIS — I482 Chronic atrial fibrillation, unspecified: Secondary | ICD-10-CM | POA: Diagnosis not present

## 2019-03-24 DIAGNOSIS — I700 Atherosclerosis of aorta: Secondary | ICD-10-CM | POA: Diagnosis not present

## 2019-04-03 IMAGING — CT CT CHEST W/O CM
2 of 4 series · 11 of 36 positions shown, 13 images · non-contrast
Comparison: Chest CT 04/09/2016.

CLINICAL DATA: 76-year-old male with history of hypervolemia for
the past 2 years. Fluid in the abdomen. Excessive gas. Shortness of
breath.

EXAM:
CT CHEST, ABDOMEN AND PELVIS WITHOUT CONTRAST
TECHNIQUE: Multidetector CT imaging of the chest, abdomen and pelvis was
performed following the standard protocol without IV contrast.

[Series 2: axials cap · axial · 0.77mm/px · z∈[-1531,-986]mm · 8 of 135 slices shown, 10 images]
[im 13/135  mediastinal]
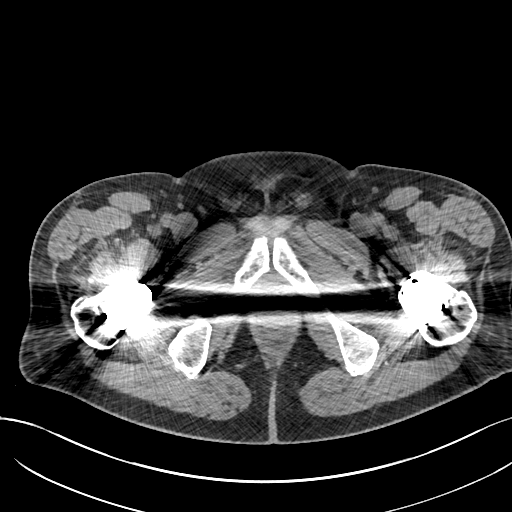
[im 13/135  lung]
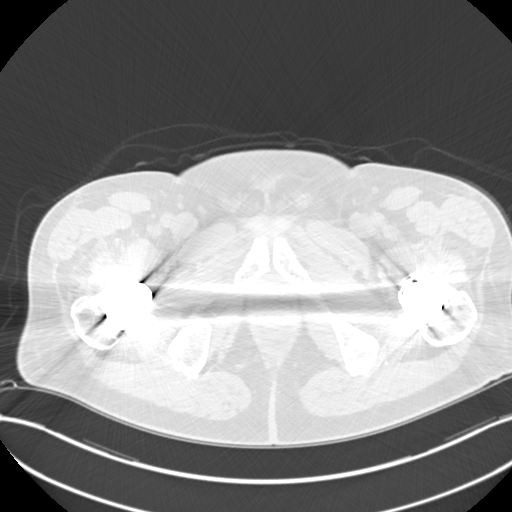
[im 25/135  lung]
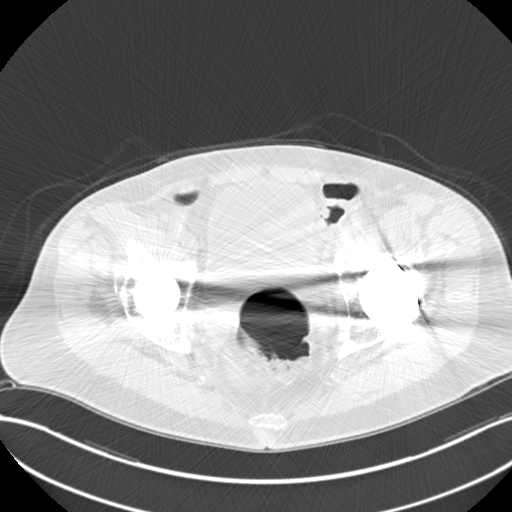
[im 49/135  lung]
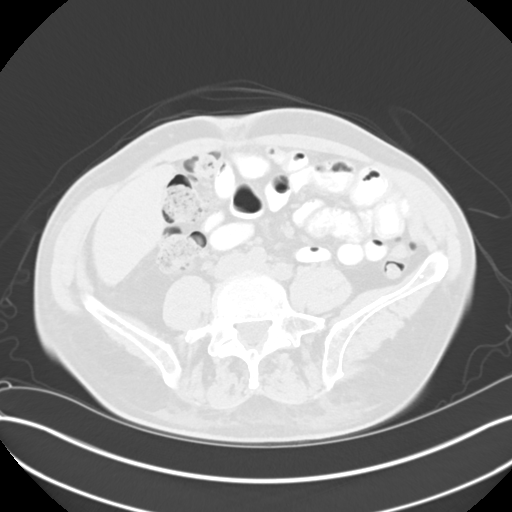
[im 61/135  lung]
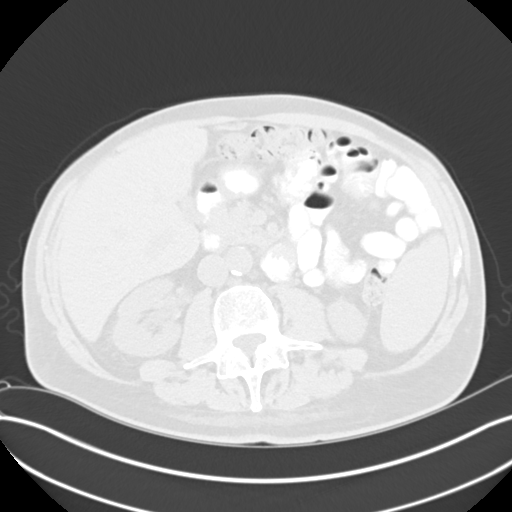
[im 74/135  mediastinal]
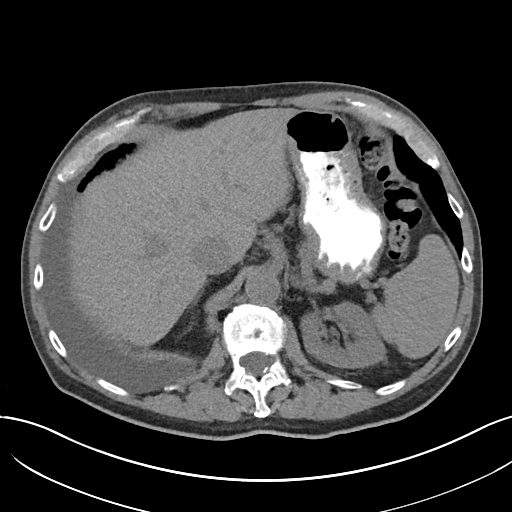
[im 74/135  lung]
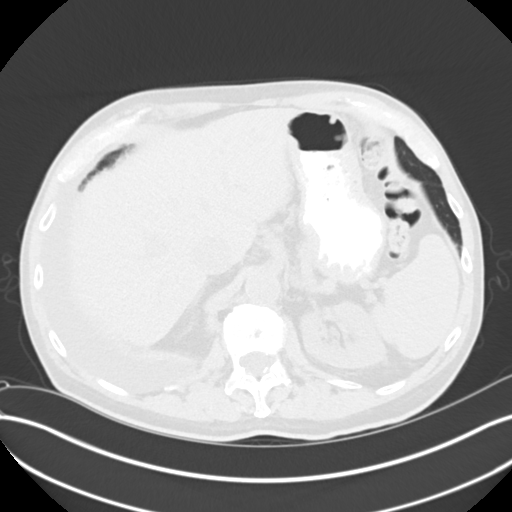
[im 86/135  lung]
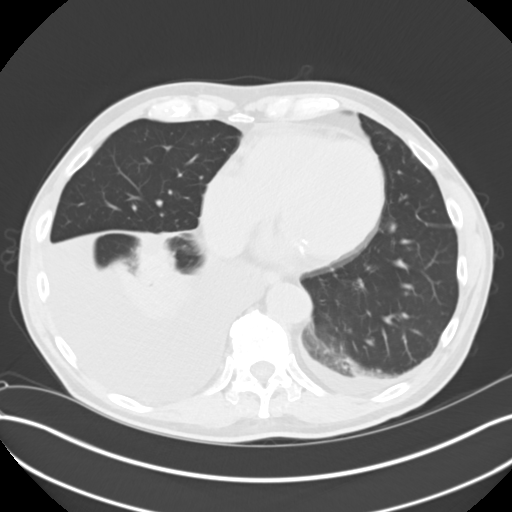
[im 110/135  lung]
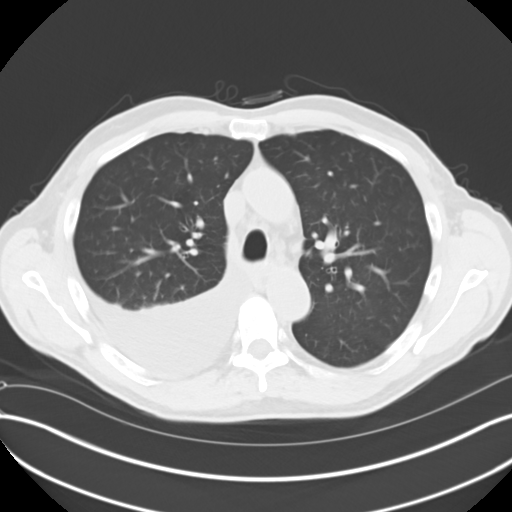
[im 122/135  lung]
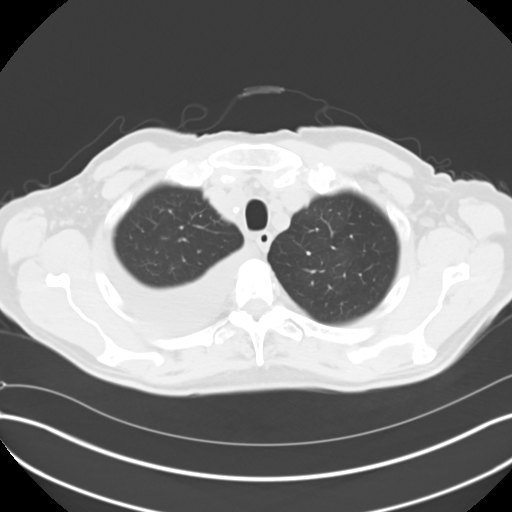

[Series 4: coronals cap · coronal · 0.77mm/px · 3 of 196 slices shown]
[im 40/196  lung]
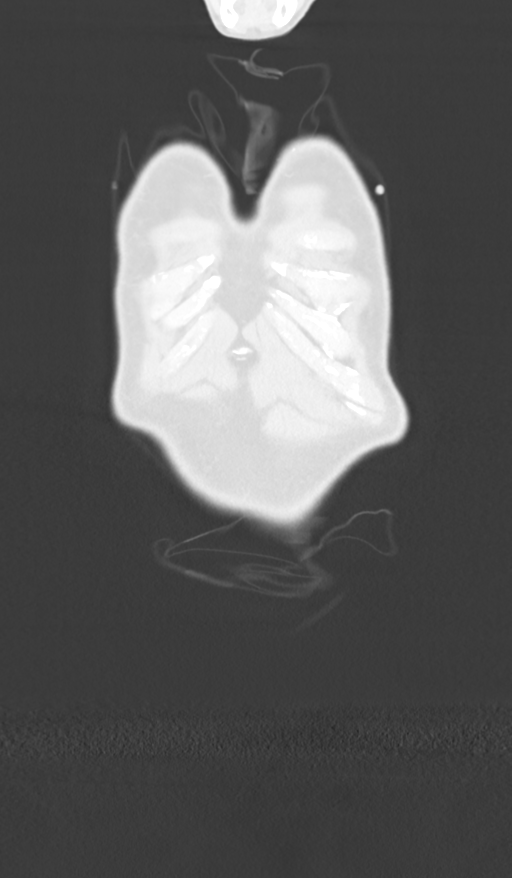
[im 79/196  lung]
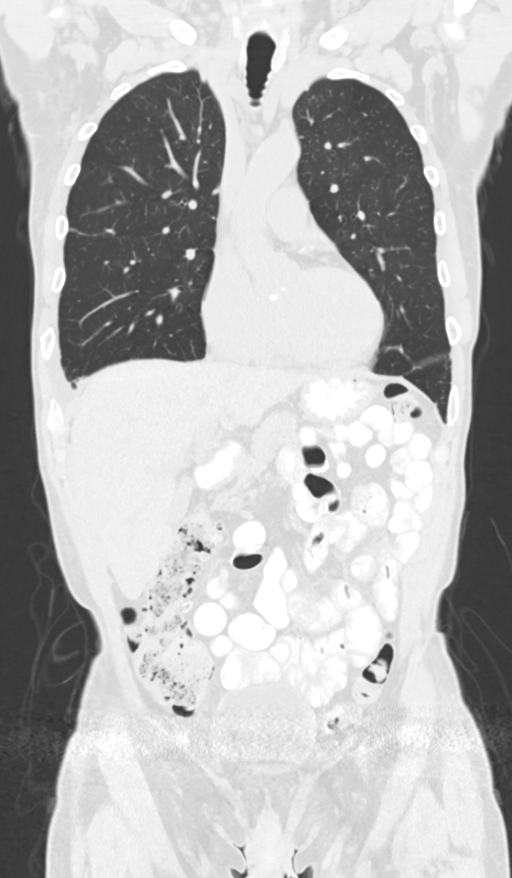
[im 118/196  lung]
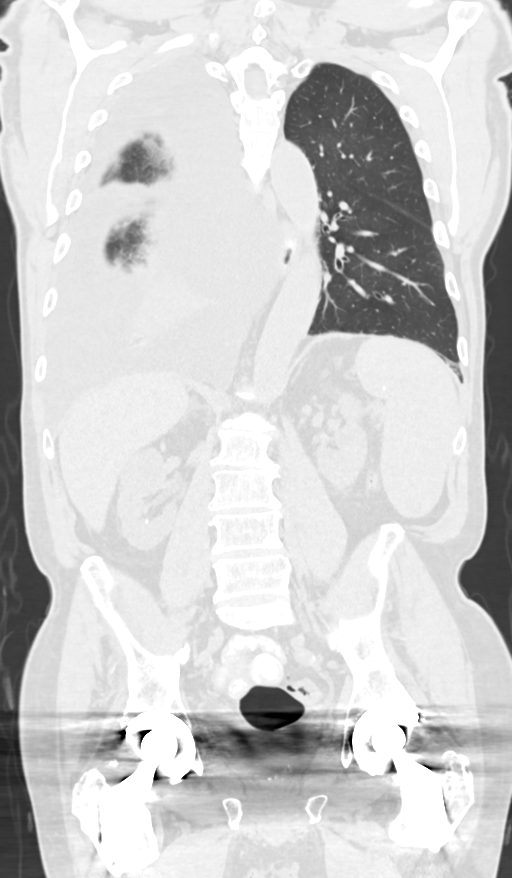

[11 of 36 positions shown; findings below may reference images not displayed]

FINDINGS: CT CHEST FINDINGS

Cardiovascular: Heart size is normal. There is no significant
pericardial fluid, thickening or pericardial calcification. There is
aortic atherosclerosis, as well as atherosclerosis of the great
vessels of the mediastinum and the coronary arteries, including
calcified atherosclerotic plaque in the left main, left anterior
descending and right coronary arteries. Calcifications of the aortic
valve.

Mediastinum/Nodes: No pathologically enlarged mediastinal or hilar
lymph nodes. Please note that accurate exclusion of hilar adenopathy
is limited on noncontrast CT scans. Multiple densely calcified
mediastinal and right hilar lymph nodes are incidentally noted.
Esophagus is unremarkable in appearance. No axillary
lymphadenopathy.

Lungs/Pleura: No suspicious appearing pulmonary nodules or masses
are noted. No acute consolidative airspace disease. Moderate to
large right and trace left pleural effusions lying dependently with
areas of passive dependent subsegmental atelectasis in the lower
lobes of the lungs bilaterally. Small calcified pleural plaques in
the anterior aspect of the left hemithorax. No right-sided calcified
pleural plaques.

Musculoskeletal: There are no aggressive appearing lytic or blastic
lesions noted in the visualized portions of the skeleton.

CT ABDOMEN PELVIS FINDINGS

Hepatobiliary: No definite cystic or solid hepatic lesions are
confidently identified on today's noncontrast CT examination.
Unenhanced appearance of the gallbladder is normal.

Pancreas: No definite pancreatic mass or peripancreatic fluid or
inflammatory changes are noted on today's noncontrast CT
examination.

Spleen: Unremarkable.

Adrenals/Urinary Tract: Tiny nonobstructive calculi are noted in the
right renal collecting system, largest of which measures 3 mm in the
lower pole. No additional calculi are identified in the collecting
system of the left kidney, along the well visualized portions of
either ureter or within the lumen of the urinary bladder (please
note that much of the distal third of both ureters and dependent
portions of the urinary bladder are completely obscured by beam
hardening artifact from bilateral hip arthroplasties). Exophytic
lower pole low-attenuation lesion in the lateral aspect of the right
kidney, incompletely characterized on today's noncontrast CT
examination, but statistically likely to represent a tiny cyst. Left
kidney is normal in appearance. No hydroureteronephrosis. Unenhanced
appearance of the urinary bladder is unremarkable. Bilateral adrenal
glands are normal in appearance.

Stomach/Bowel: Normal appearance of the stomach. No pathologic
dilatation of small bowel or colon numerous colonic diverticulae are
noted, without surrounding inflammatory changes to suggest an acute
diverticulitis at this time. The appendix is not confidently
identified and may be surgically absent. Regardless, there are no
inflammatory changes noted adjacent to the cecum to suggest the
presence of an acute appendicitis at this time.

Vascular/Lymphatic: Aortic atherosclerosis. No lymphadenopathy noted
in the abdomen or pelvis.

Reproductive: Prostate gland and seminal vesicles are completely
obscured by beam hardening artifact.

Other: No significant volume of ascites.  No pneumoperitoneum.

Musculoskeletal: Bilateral hip arthroplasties are incidentally noted
causing extensive beam hardening artifact throughout the pelvis.
There are no aggressive appearing lytic or blastic lesions noted in
the visualized portions of the skeleton.
IMPRESSION: 1. No ascites.
2. Moderate to large right and small left pleural effusions lying
dependently with some associated passive subsegmental atelectasis in
the dependent portions of the lungs.
3. Aortic atherosclerosis, in addition to left main and 2 vessel
coronary artery disease. Assessment for potential risk factor
modification, dietary therapy or pharmacologic therapy may be
warranted, if clinically indicated.
4. There are calcifications of the aortic valve. Echocardiographic
correlation for evaluation of potential valvular dysfunction may be
warranted if clinically indicated.
5. Additional incidental findings, as above.

## 2019-04-21 DIAGNOSIS — R809 Proteinuria, unspecified: Secondary | ICD-10-CM | POA: Diagnosis not present

## 2019-05-04 DIAGNOSIS — R55 Syncope and collapse: Secondary | ICD-10-CM | POA: Diagnosis not present

## 2019-05-04 DIAGNOSIS — I480 Paroxysmal atrial fibrillation: Secondary | ICD-10-CM | POA: Diagnosis not present

## 2019-05-04 DIAGNOSIS — I6523 Occlusion and stenosis of bilateral carotid arteries: Secondary | ICD-10-CM | POA: Diagnosis not present

## 2019-05-04 DIAGNOSIS — E7800 Pure hypercholesterolemia, unspecified: Secondary | ICD-10-CM | POA: Diagnosis not present

## 2019-05-04 DIAGNOSIS — I2510 Atherosclerotic heart disease of native coronary artery without angina pectoris: Secondary | ICD-10-CM | POA: Diagnosis not present

## 2019-05-20 DIAGNOSIS — Z96641 Presence of right artificial hip joint: Secondary | ICD-10-CM | POA: Diagnosis not present

## 2019-05-20 DIAGNOSIS — M1711 Unilateral primary osteoarthritis, right knee: Secondary | ICD-10-CM | POA: Diagnosis not present

## 2019-05-20 DIAGNOSIS — T85848A Pain due to other internal prosthetic devices, implants and grafts, initial encounter: Secondary | ICD-10-CM | POA: Diagnosis not present

## 2019-07-17 DIAGNOSIS — N184 Chronic kidney disease, stage 4 (severe): Secondary | ICD-10-CM | POA: Diagnosis not present

## 2019-07-17 DIAGNOSIS — I10 Essential (primary) hypertension: Secondary | ICD-10-CM | POA: Diagnosis not present

## 2019-07-30 DIAGNOSIS — I10 Essential (primary) hypertension: Secondary | ICD-10-CM | POA: Diagnosis not present

## 2019-07-30 DIAGNOSIS — I4891 Unspecified atrial fibrillation: Secondary | ICD-10-CM | POA: Diagnosis not present

## 2019-07-30 DIAGNOSIS — R809 Proteinuria, unspecified: Secondary | ICD-10-CM | POA: Diagnosis not present

## 2019-07-30 DIAGNOSIS — N184 Chronic kidney disease, stage 4 (severe): Secondary | ICD-10-CM | POA: Diagnosis not present

## 2019-08-12 DIAGNOSIS — R69 Illness, unspecified: Secondary | ICD-10-CM | POA: Diagnosis not present

## 2019-08-24 DIAGNOSIS — I2510 Atherosclerotic heart disease of native coronary artery without angina pectoris: Secondary | ICD-10-CM | POA: Diagnosis not present

## 2019-08-24 DIAGNOSIS — I5022 Chronic systolic (congestive) heart failure: Secondary | ICD-10-CM | POA: Diagnosis not present

## 2019-08-31 DIAGNOSIS — I4821 Permanent atrial fibrillation: Secondary | ICD-10-CM | POA: Diagnosis not present

## 2019-08-31 DIAGNOSIS — Z0000 Encounter for general adult medical examination without abnormal findings: Secondary | ICD-10-CM | POA: Diagnosis not present

## 2019-08-31 DIAGNOSIS — I2510 Atherosclerotic heart disease of native coronary artery without angina pectoris: Secondary | ICD-10-CM | POA: Diagnosis not present

## 2019-08-31 DIAGNOSIS — I130 Hypertensive heart and chronic kidney disease with heart failure and stage 1 through stage 4 chronic kidney disease, or unspecified chronic kidney disease: Secondary | ICD-10-CM | POA: Diagnosis not present

## 2019-08-31 DIAGNOSIS — N184 Chronic kidney disease, stage 4 (severe): Secondary | ICD-10-CM | POA: Diagnosis not present

## 2019-08-31 DIAGNOSIS — I5022 Chronic systolic (congestive) heart failure: Secondary | ICD-10-CM | POA: Diagnosis not present

## 2019-08-31 DIAGNOSIS — I700 Atherosclerosis of aorta: Secondary | ICD-10-CM | POA: Diagnosis not present

## 2019-09-15 ENCOUNTER — Other Ambulatory Visit: Payer: Self-pay

## 2019-09-15 ENCOUNTER — Inpatient Hospital Stay: Payer: Medicare HMO | Attending: Oncology

## 2019-09-15 DIAGNOSIS — Z87891 Personal history of nicotine dependence: Secondary | ICD-10-CM | POA: Insufficient documentation

## 2019-09-15 DIAGNOSIS — D472 Monoclonal gammopathy: Secondary | ICD-10-CM | POA: Diagnosis present

## 2019-09-15 DIAGNOSIS — D631 Anemia in chronic kidney disease: Secondary | ICD-10-CM | POA: Insufficient documentation

## 2019-09-15 DIAGNOSIS — N400 Enlarged prostate without lower urinary tract symptoms: Secondary | ICD-10-CM | POA: Insufficient documentation

## 2019-09-15 DIAGNOSIS — Z7901 Long term (current) use of anticoagulants: Secondary | ICD-10-CM | POA: Diagnosis not present

## 2019-09-15 DIAGNOSIS — I509 Heart failure, unspecified: Secondary | ICD-10-CM | POA: Diagnosis not present

## 2019-09-15 DIAGNOSIS — I429 Cardiomyopathy, unspecified: Secondary | ICD-10-CM | POA: Insufficient documentation

## 2019-09-15 DIAGNOSIS — N189 Chronic kidney disease, unspecified: Secondary | ICD-10-CM | POA: Insufficient documentation

## 2019-09-15 DIAGNOSIS — Z79899 Other long term (current) drug therapy: Secondary | ICD-10-CM | POA: Diagnosis not present

## 2019-09-15 DIAGNOSIS — I4891 Unspecified atrial fibrillation: Secondary | ICD-10-CM | POA: Diagnosis not present

## 2019-09-15 DIAGNOSIS — I130 Hypertensive heart and chronic kidney disease with heart failure and stage 1 through stage 4 chronic kidney disease, or unspecified chronic kidney disease: Secondary | ICD-10-CM | POA: Insufficient documentation

## 2019-09-15 DIAGNOSIS — E7800 Pure hypercholesterolemia, unspecified: Secondary | ICD-10-CM | POA: Diagnosis not present

## 2019-09-15 LAB — CBC WITH DIFFERENTIAL/PLATELET
Abs Immature Granulocytes: 0.02 10*3/uL (ref 0.00–0.07)
Basophils Absolute: 0 10*3/uL (ref 0.0–0.1)
Basophils Relative: 0 %
Eosinophils Absolute: 0.2 10*3/uL (ref 0.0–0.5)
Eosinophils Relative: 3 %
HCT: 33.2 % — ABNORMAL LOW (ref 39.0–52.0)
Hemoglobin: 11.4 g/dL — ABNORMAL LOW (ref 13.0–17.0)
Immature Granulocytes: 0 %
Lymphocytes Relative: 12 %
Lymphs Abs: 0.6 10*3/uL — ABNORMAL LOW (ref 0.7–4.0)
MCH: 33.4 pg (ref 26.0–34.0)
MCHC: 34.3 g/dL (ref 30.0–36.0)
MCV: 97.4 fL (ref 80.0–100.0)
Monocytes Absolute: 0.3 10*3/uL (ref 0.1–1.0)
Monocytes Relative: 6 %
Neutro Abs: 4.1 10*3/uL (ref 1.7–7.7)
Neutrophils Relative %: 79 %
Platelets: 186 10*3/uL (ref 150–400)
RBC: 3.41 MIL/uL — ABNORMAL LOW (ref 4.22–5.81)
RDW: 12.6 % (ref 11.5–15.5)
WBC: 5.2 10*3/uL (ref 4.0–10.5)
nRBC: 0 % (ref 0.0–0.2)

## 2019-09-15 LAB — BASIC METABOLIC PANEL
Anion gap: 10 (ref 5–15)
BUN: 77 mg/dL — ABNORMAL HIGH (ref 8–23)
CO2: 23 mmol/L (ref 22–32)
Calcium: 8.8 mg/dL — ABNORMAL LOW (ref 8.9–10.3)
Chloride: 107 mmol/L (ref 98–111)
Creatinine, Ser: 2.99 mg/dL — ABNORMAL HIGH (ref 0.61–1.24)
GFR calc Af Amer: 22 mL/min — ABNORMAL LOW (ref >60)
GFR calc non Af Amer: 19 mL/min — ABNORMAL LOW (ref >60)
Glucose, Bld: 90 mg/dL (ref 70–99)
Potassium: 4.4 mmol/L (ref 3.5–5.1)
Sodium: 140 mmol/L (ref 135–145)

## 2019-09-16 LAB — PROTEIN ELECTROPHORESIS, SERUM
A/G Ratio: 1.4 (ref 0.7–1.7)
Albumin ELP: 3.9 g/dL (ref 2.9–4.4)
Alpha-1-Globulin: 0.2 g/dL (ref 0.0–0.4)
Alpha-2-Globulin: 0.6 g/dL (ref 0.4–1.0)
Beta Globulin: 0.9 g/dL (ref 0.7–1.3)
Gamma Globulin: 1.1 g/dL (ref 0.4–1.8)
Globulin, Total: 2.7 g/dL (ref 2.2–3.9)
M-Spike, %: 0.5 g/dL — ABNORMAL HIGH
Total Protein ELP: 6.6 g/dL (ref 6.0–8.5)

## 2019-09-16 LAB — IGG, IGA, IGM
IgA: 383 mg/dL (ref 61–437)
IgG (Immunoglobin G), Serum: 1037 mg/dL (ref 603–1613)
IgM (Immunoglobulin M), Srm: 46 mg/dL (ref 15–143)

## 2019-09-16 LAB — KAPPA/LAMBDA LIGHT CHAINS
Kappa free light chain: 98.9 mg/L — ABNORMAL HIGH (ref 3.3–19.4)
Kappa, lambda light chain ratio: 0.66 (ref 0.26–1.65)
Lambda free light chains: 150.3 mg/L — ABNORMAL HIGH (ref 5.7–26.3)

## 2019-09-17 DIAGNOSIS — R69 Illness, unspecified: Secondary | ICD-10-CM | POA: Diagnosis not present

## 2019-09-20 NOTE — Progress Notes (Signed)
[place]  Telephone:([phone] [phone]  ID: Lawrence Perkins. OB: 09-28-1940  MR#: [id number]  TSV#:[id number]  Patient Care Team: Kirk Ruths, MD as PCP - General (Internal Medicine)  CHIEF COMPLAINT: MGUS.  INTERVAL HISTORY: Patient returns to clinic today for discussion of his laboratory results and routine yearly evaluation.  He continues to feel well and remains asymptomatic. He has no neurologic complaints.  He denies any pain. He has a good appetite and denies weight loss.  He denies any chest pain, shortness of breath, cough, or hemoptysis.  He denies any nausea, vomiting, constipation, or diarrhea. He has no urinary complaints.  Patient offers no specific complaints today.  REVIEW OF SYSTEMS:   Review of Systems  Constitutional: Negative.  Negative for fever, malaise/fatigue and weight loss.  Respiratory: Negative.  Negative for cough and shortness of breath.   Cardiovascular: Negative.  Negative for chest pain and leg swelling.  Gastrointestinal: Negative.  Negative for abdominal pain.  Genitourinary: Negative.  Negative for dysuria.  Musculoskeletal: Negative.  Negative for back pain.  Skin: Negative.  Negative for rash.  Neurological: Negative.  Negative for focal weakness, weakness and headaches.  Endo/Heme/Allergies: Does not bruise/bleed easily.  Psychiatric/Behavioral: Negative.  The patient is not nervous/anxious.     As per HPI. Otherwise, a complete review of systems is negative.  PAST MEDICAL HISTORY: Past Medical History:  Diagnosis Date  . BPH (benign prostatic hyperplasia)   . Cardiomyopathy ([place])   . CHF (congestive heart failure) ([place])   . Chronic kidney disease   . Complication of anesthesia    has converted to afib during and/or after surgery in the past  . Diverticulosis   . Dysrhythmia    afib  . Gout   . History of colon polyps   . Hypercholesteremia   . Hypertension   . 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 MGUS (monoclonal gammopathy of unknown significance)   . Pleural effusion   . Prostatism   . Rectal bleeding     PAST SURGICAL HISTORY: Past Surgical History:  Procedure Laterality Date  . ANKLE SURGERY     following volleyball injury  . APPENDECTOMY    . COLONOSCOPY    . COLONOSCOPY WITH PROPOFOL N/A 01/02/2017   Procedure: COLONOSCOPY WITH PROPOFOL;  Surgeon: Manya Silvas, MD;  Location: [facility] ENDOSCOPY;  Service: Endoscopy;  Laterality: N/A;  . JOINT REPLACEMENT     RIGHT TOTAL HIP  . TONSILLECTOMY    . TOTAL HIP ARTHROPLASTY  09/2009   Right femoral neck fracture  . TOTAL HIP ARTHROPLASTY Left 03/06/2016   Procedure: TOTAL HIP ARTHROPLASTY ANTERIOR APPROACH;  Surgeon: Hessie Knows, MD;  Location: [facility] ORS;  Service: Orthopedics;  Laterality: Left;    FAMILY HISTORY: Family History  Problem Relation Age of Onset  . Cirrhosis Mother   . Other Father        Lung Fibrosis  . Prostate cancer Neg Hx   . Kidney cancer Neg Hx   . Bladder Cancer Neg Hx     ADVANCED DIRECTIVES (Y/N):  N  HEALTH MAINTENANCE: Social History   Tobacco Use  . Smoking status: Former Smoker    Years: 15.00    Quit date: 01/01/1981    Years since quitting: 38.7  . Smokeless tobacco: Never Used  Vaping Use  . Vaping Use: Never used  Substance Use Topics  . Alcohol use: Yes    Alcohol/week: 2.0 standard  drinks    Types: 2 Cans of beer per week  . Drug use: No     Colonoscopy:  PAP:  Bone density:  Lipid panel:  No Known Allergies  Current Outpatient Medications  Medication Sig Dispense Refill  . amoxicillin (AMOXIL) 500 MG capsule Take by mouth.    . carvedilol (COREG) 6.25 MG tablet Take 3.125 mg by mouth 2 (two) times daily with a meal.     . Cholecalciferol (VITAMIN D3) 2000 units capsule Take by mouth.    . rosuvastatin (CRESTOR) 10 MG tablet Take 10 mg by mouth daily.    . tamsulosin (FLOMAX) 0.4 MG CAPS capsule Take 1 capsule (0.4 mg  total) by mouth daily. 90 capsule 3  . torsemide (DEMADEX) 20 MG tablet Take 2 tablets (40 mg total) by mouth daily. 60 tablet 0  . apixaban (ELIQUIS) 5 MG TABS tablet Take 5 mg by mouth 2 (two) times daily. (Patient not taking: Reported on 09/22/2019)     No current facility-administered medications for this visit.    OBJECTIVE: Vitals:   09/22/19 1104  BP: 115/76  Pulse: 66  Resp: 20  Temp: 97.7 F (36.5 C)     Body mass index is 26.71 kg/m.    ECOG FS:0 - Asymptomatic  General: Well-developed, well-nourished, no acute distress. Eyes: Pink conjunctiva, anicteric sclera. HEENT: Normocephalic, moist mucous membranes. Lungs: No audible wheezing or coughing. Heart: Regular rate and rhythm. Abdomen: Soft, nontender, no obvious distention. Musculoskeletal: No edema, cyanosis, or clubbing. Neuro: Alert, answering all questions appropriately. Cranial nerves grossly intact. Skin: No rashes or petechiae noted. Psych: Normal affect.  LAB RESULTS:  Lab Results  Component Value Date   NA 140 09/15/2019   K 4.4 09/15/2019   CL 107 09/15/2019   CO2 23 09/15/2019   GLUCOSE 90 09/15/2019   BUN 77 (H) 09/15/2019   CREATININE 2.99 (H) 09/15/2019   CALCIUM 8.8 (L) 09/15/2019   PROT 7.3 02/17/2018   ALBUMIN 4.1 02/17/2018   AST 12 (L) 02/17/2018   ALT 11 02/17/2018   ALKPHOS 57 02/17/2018   BILITOT 0.9 02/17/2018   GFRNONAA 19 (L) 09/15/2019   GFRAA 22 (L) 09/15/2019    Lab Results  Component Value Date   WBC 5.2 09/15/2019   NEUTROABS 4.1 09/15/2019   HGB 11.4 (L) 09/15/2019   HCT 33.2 (L) 09/15/2019   MCV 97.4 09/15/2019   PLT 186 09/15/2019   Lab Results  Component Value Date   TOTALPROTELP 6.6 09/15/2019   ALBUMINELP 3.9 09/15/2019   A1GS 0.2 09/15/2019   A2GS 0.6 09/15/2019   BETS 0.9 09/15/2019   GAMS 1.1 09/15/2019   MSPIKE 0.5 (H) 09/15/2019   SPEI Comment 09/15/2019     STUDIES: No results found.  ASSESSMENT: MGUS  PLAN:    1. MGUS: Patient had a  bone marrow biopsy on November 03, 2015 that revealed only a mild increased plasma cell population of approximately 5-10%. Patient was also noted to have cytogenetic abnormality of a monosomy 59 which is an average risk of multiple myeloma.  Patient's M spike has ranged between 0.3 and 0.6 since January 2018.  Today's result is 0.5.  Patient's most recent M spike is 0.4 which is relatively unchanged.  Immunoglobulins continue to be within normal limits.  Both kappa and lambda free light chains are elevated, but his ratio is normal.  Given his bone marrow results, patient is at mild to moderate risk to progress to multiple myeloma. Will consider a metastatic  bone survey in the future if there is suspicion of progression of disease.  No intervention is needed at this time.  Return to clinic in 1 year for routine evaluation.   2. Renal insufficiency: Chronic and unchanged.  Unlikely related to underlying MGUS or plasma cell population seen in bone marrow biopsy.  Continue to follow-up with nephrology as scheduled. 3.  Anemia: Chronic and unchanged.  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  Patient may benefit from Retacrit if his hemoglobin decreases and remains persistently below 10.0.   Patient expressed understanding and was in agreement with this plan. He also understands that He can call clinic at any time with any questions, concerns, or complaints.    Lloyd Huger, MD   09/23/2019 2:39 PM

## 2019-09-21 DIAGNOSIS — R69 Illness, unspecified: Secondary | ICD-10-CM | POA: Diagnosis not present

## 2019-09-22 ENCOUNTER — Inpatient Hospital Stay (HOSPITAL_BASED_OUTPATIENT_CLINIC_OR_DEPARTMENT_OTHER): Payer: Medicare HMO | Admitting: Oncology

## 2019-09-22 ENCOUNTER — Encounter: Payer: Self-pay | Admitting: Oncology

## 2019-09-22 VITALS — BP 115/76 | HR 66 | Temp 97.7°F | Resp 20 | Wt 191.5 lb

## 2019-09-22 DIAGNOSIS — D472 Monoclonal gammopathy: Secondary | ICD-10-CM | POA: Diagnosis not present

## 2019-09-22 NOTE — Progress Notes (Signed)
Patient denies any concerns today.  

## 2019-11-03 DIAGNOSIS — I4821 Permanent atrial fibrillation: Secondary | ICD-10-CM | POA: Diagnosis not present

## 2019-11-03 DIAGNOSIS — I700 Atherosclerosis of aorta: Secondary | ICD-10-CM | POA: Diagnosis not present

## 2019-11-03 DIAGNOSIS — E7800 Pure hypercholesterolemia, unspecified: Secondary | ICD-10-CM | POA: Diagnosis not present

## 2019-11-03 DIAGNOSIS — I129 Hypertensive chronic kidney disease with stage 1 through stage 4 chronic kidney disease, or unspecified chronic kidney disease: Secondary | ICD-10-CM | POA: Diagnosis not present

## 2019-11-03 DIAGNOSIS — N184 Chronic kidney disease, stage 4 (severe): Secondary | ICD-10-CM | POA: Diagnosis not present

## 2019-11-03 DIAGNOSIS — I2510 Atherosclerotic heart disease of native coronary artery without angina pectoris: Secondary | ICD-10-CM | POA: Diagnosis not present

## 2019-12-08 DIAGNOSIS — I2510 Atherosclerotic heart disease of native coronary artery without angina pectoris: Secondary | ICD-10-CM | POA: Diagnosis not present

## 2019-12-08 DIAGNOSIS — I5022 Chronic systolic (congestive) heart failure: Secondary | ICD-10-CM | POA: Diagnosis not present

## 2019-12-08 DIAGNOSIS — I700 Atherosclerosis of aorta: Secondary | ICD-10-CM | POA: Diagnosis not present

## 2019-12-08 DIAGNOSIS — I480 Paroxysmal atrial fibrillation: Secondary | ICD-10-CM | POA: Diagnosis not present

## 2019-12-08 DIAGNOSIS — I4821 Permanent atrial fibrillation: Secondary | ICD-10-CM | POA: Diagnosis not present

## 2019-12-22 ENCOUNTER — Other Ambulatory Visit: Payer: Self-pay | Admitting: Urology

## 2020-01-12 DIAGNOSIS — D3131 Benign neoplasm of right choroid: Secondary | ICD-10-CM | POA: Diagnosis not present

## 2020-01-12 DIAGNOSIS — H35039 Hypertensive retinopathy, unspecified eye: Secondary | ICD-10-CM | POA: Diagnosis not present

## 2020-01-12 DIAGNOSIS — H2513 Age-related nuclear cataract, bilateral: Secondary | ICD-10-CM | POA: Diagnosis not present

## 2020-01-13 ENCOUNTER — Telehealth: Payer: Self-pay | Admitting: Family

## 2020-01-13 NOTE — Telephone Encounter (Signed)
Called to discuss with patient about COVID-19 symptoms and the use of one of the available treatments for those with mild to moderate Covid symptoms and at a high risk of hospitalization.  Pt appears to qualify for outpatient treatment due to co-morbid conditions and/or a member of an at-risk group in accordance with the FDA Emergency Use Authorization.    Tells me his wife tested positive for COVID-19 but has completed 11 days of quarantine with improvement in symptoms. They both have had primary series of COVID-19 vaccinations as well as booster. He denies any symptoms. Due to lack of symptoms, no indication for MAB nor IV/PO antivirals at this time. As his wife's symptoms are >7 days and improving, no indication for MAB or antiviral.   He was appreciative of call and tells me they both are doing well.   Lawrence Perkins

## 2020-01-25 DIAGNOSIS — D225 Melanocytic nevi of trunk: Secondary | ICD-10-CM | POA: Diagnosis not present

## 2020-01-25 DIAGNOSIS — X32XXXA Exposure to sunlight, initial encounter: Secondary | ICD-10-CM | POA: Diagnosis not present

## 2020-01-25 DIAGNOSIS — L570 Actinic keratosis: Secondary | ICD-10-CM | POA: Diagnosis not present

## 2020-01-25 DIAGNOSIS — Z85828 Personal history of other malignant neoplasm of skin: Secondary | ICD-10-CM | POA: Diagnosis not present

## 2020-01-25 DIAGNOSIS — D2262 Melanocytic nevi of left upper limb, including shoulder: Secondary | ICD-10-CM | POA: Diagnosis not present

## 2020-01-25 DIAGNOSIS — D485 Neoplasm of uncertain behavior of skin: Secondary | ICD-10-CM | POA: Diagnosis not present

## 2020-01-25 DIAGNOSIS — Z85820 Personal history of malignant melanoma of skin: Secondary | ICD-10-CM | POA: Diagnosis not present

## 2020-01-25 DIAGNOSIS — D2261 Melanocytic nevi of right upper limb, including shoulder: Secondary | ICD-10-CM | POA: Diagnosis not present

## 2020-01-26 DIAGNOSIS — I10 Essential (primary) hypertension: Secondary | ICD-10-CM | POA: Diagnosis not present

## 2020-01-26 DIAGNOSIS — N184 Chronic kidney disease, stage 4 (severe): Secondary | ICD-10-CM | POA: Diagnosis not present

## 2020-01-26 NOTE — Progress Notes (Signed)
01/24/2018 12:26 PM   Lawrence Perkins. 28-Oct-1940 [id number]  Referring provider: Kirk Ruths, MD [facility] [place],   [id number]  Chief Complaint  Patient presents with  . Benign Prostatic Hypertrophy   Urological history 1. BPH with LU TS - PSA 0.95 in 2016 - discontinued screening due to age - I PSS 8/2 - PVR 119 mL - managed with tamsulosin 0.4 mg daily  HPI: Lawrence Lor. is a 80 y.o. male Caucasian with BPH with LU TS who presents today for an annual exam.  He has urinary frequency due to fluid pills.  Patient denies any modifying or aggravating factors.  Patient denies any gross hematuria, dysuria or suprapubic/flank pain.  Patient denies any fevers, chills, nausea or vomiting.   UA positive for 3-10 RBC's.    IPSS    Row Name 01/27/20 0900         International Prostate Symptom Score   How often have you had the sensation of not emptying your bladder? Not at All     How often have you had to urinate less than every two hours? About half the time     How often have you found you stopped and started again several times when you urinated? Not at All     How often have you found it difficult to postpone urination? Not at All     How often have you had a weak urinary stream? About half the time     How often have you had to strain to start urination? Not at All     How many times did you typically get up at night to urinate? 2 Times     Total IPSS Score 8           Quality of Life due to urinary symptoms   If you were to spend the rest of your life with your urinary condition just the way it is now how would you feel about that? Mostly Satisfied            Score:  1-7 Mild 8-19 Moderate 20-35 Severe  PMH: Past Medical History:  Diagnosis Date  . BPH (benign prostatic hyperplasia)   . Cardiomyopathy ([place])   . CHF (congestive heart failure) ([place])   . Chronic kidney disease   . Complication of  anesthesia    has converted to afib during and/or after surgery in the past  . Diverticulosis   . Dysrhythmia    afib  . Gout   . History of colon polyps   . Hypercholesteremia   . Hypertension   . Melanoma ([place])    status post resection from his back  . MGUS (monoclonal gammopathy of unknown significance)   . Pleural effusion   . Prostatism   . Rectal bleeding     Surgical History: Past Surgical History:  Procedure Laterality Date  . ANKLE SURGERY     following volleyball injury  . APPENDECTOMY    . COLONOSCOPY    . COLONOSCOPY WITH PROPOFOL N/A 01/02/2017   Procedure: COLONOSCOPY WITH PROPOFOL;  Surgeon: Manya Silvas, MD;  Location: [facility] ENDOSCOPY;  Service: Endoscopy;  Laterality: N/A;  . JOINT REPLACEMENT     RIGHT TOTAL HIP  . TONSILLECTOMY    . TOTAL HIP ARTHROPLASTY  09/2009   Right femoral neck fracture  . TOTAL HIP ARTHROPLASTY Left 03/06/2016   Procedure: TOTAL HIP ARTHROPLASTY ANTERIOR APPROACH;  Surgeon:  Hessie Knows, MD;  Location: [facility] ORS;  Service: Orthopedics;  Laterality: Left;    Home Medications:  Allergies as of 01/27/2020   No Known Allergies     Medication List       Accurate as of January 27, 2020 11:59 PM. If you have any questions, ask your nurse or doctor.        STOP taking these medications   Eliquis 5 MG Tabs tablet Generic drug: apixaban Stopped by: Zara Council, PA-C     TAKE these medications   amoxicillin 500 MG capsule Commonly known as: AMOXIL Take by mouth.   carvedilol 6.25 MG tablet Commonly known as: COREG Take 3.125 mg by mouth 2 (two) times daily with a meal.   hydrALAZINE 50 MG tablet Commonly known as: APRESOLINE Take 50 mg by mouth 2 (two) times daily.   rosuvastatin 10 MG tablet Commonly known as: CRESTOR Take 10 mg by mouth daily.   tamsulosin 0.4 MG Caps capsule Commonly known as: FLOMAX TAKE 1 CAPSULE DAILY   torsemide 20 MG tablet Commonly known as: DEMADEX Take 2 tablets (40 mg total) by  mouth daily.   Vitamin D3 50 MCG (2000 UT) capsule Take by mouth.       Allergies: No Known Allergies  Family History: Family History  Problem Relation Age of Onset  . Cirrhosis Mother   . Other Father        Lung Fibrosis  . Prostate cancer Neg Hx   . Kidney cancer Neg Hx   . Bladder Cancer Neg Hx     Social History:  reports that he quit smoking about 39 years ago. He quit after 15.00 years of use. He has never used smokeless tobacco. He reports current alcohol use of about 2.0 standard drinks of alcohol per week. He reports that he does not use drugs.  For pertinent review of systems please refer to history of present illness  Physical Exam: BP 116/72   Pulse (!) 134   Ht '5\' 11"'  (1.803 m)   Wt 188 lb (85.3 kg)   BMI 26.22 kg/m   Constitutional:  Well nourished. Alert and oriented, No acute distress. HEENT: [place] AT, mask in place.  Trachea midline Cardiovascular: No clubbing, cyanosis, or edema. Respiratory: Normal respiratory effort, no increased work of breathing. GU: No CVA tenderness.  No bladder fullness or masses.  Patient with circumcised phallus.  Urethral meatus is patent.  No penile discharge. No penile lesions or rashes. Scrotum without lesions, cysts, rashes and/or edema.  Testicles are located scrotally bilaterally. No masses are appreciated in the testicles. Left and right epididymis are normal. Rectal: Patient with  normal sphincter tone. Anus and perineum without scarring or rashes. No rectal masses are appreciated. Prostate is approximately 60 +grams, could only palpate the apex and the midportion of the gland, no nodules are appreciated. Seminal vesicles could not be palpated Lymph: No inguinal adenopathy. Neurologic: Grossly intact, no focal deficits, moving all 4 extremities. Psychiatric: Normal mood and affect.   Laboratory Data: Lab Results  Component Value Date   WBC 5.2 09/15/2019   HGB 11.4 (L) 09/15/2019   HCT 33.2 (L) 09/15/2019   MCV 97.4  09/15/2019   PLT 186 09/15/2019    Lab Results  Component Value Date   CREATININE 2.99 (H) 09/15/2019   Specimen:  Blood  Ref Range & Units 5 mo ago  Glucose 70 - 110 mg/dL 93   Sodium 136 - 145 mmol/L 139   Potassium 3.6 -  5.1 mmol/L 5.5High   Chloride 97 - 109 mmol/L 107   Carbon Dioxide (CO2) 22.0 - 32.0 mmol/L 26.5   Urea Nitrogen (BUN) 7 - 25 mg/dL 57High   Creatinine 0.7 - 1.3 mg/dL 3.0High   Glomerular Filtration Rate (eGFR), MDRD Estimate >60 mL/min/1.73sq m 20Low   Calcium 8.7 - 10.3 mg/dL 8.7   AST  8 - 39 U/L 9   ALT  6 - 57 U/L 8   Alk Phos (alkaline Phosphatase) 34 - 104 U/L 60   Albumin 3.5 - 4.8 g/dL 4.1   Bilirubin, Total 0.3 - 1.2 mg/dL 0.4   Protein, Total 6.1 - 7.9 g/dL 6.7   A/G Ratio 1.0 - 5.0 gm/dL 1.6   Resulting Agency  [place] - LAB  Specimen Collected: 08/24/19 7:26 AM Last Resulted: 08/24/19 9:20 AM  Received From: [place]  Result Received: 09/14/19 8:45 AM   Specimen:  Blood  Ref Range & Units 5 mo ago  WBC (White Blood Cell Count) 4.1 - 10.2 10^3/uL 4.2   RBC (Red Blood Cell Count) 4.69 - 6.13 10^6/uL 3.27Low   Hemoglobin 14.1 - 18.1 gm/dL 10.7Low   Hematocrit 40.0 - 52.0 % 33.1Low   MCV (Mean Corpuscular Volume) 80.0 - 100.0 fl 101.2High   MCH (Mean Corpuscular Hemoglobin) 27.0 - 31.2 pg 32.7High   MCHC (Mean Corpuscular Hemoglobin Concentration) 32.0 - 36.0 gm/dL 32.3   Platelet Count 150 - 450 10^3/uL 183   RDW-CV (Red Cell Distribution Width) 11.6 - 14.8 % 12.7   MPV (Mean Platelet Volume) 9.4 - 12.4 fl 10.2   Neutrophils 1.50 - 7.80 10^3/uL 3.01   Lymphocytes 1.00 - 3.60 10^3/uL 0.75Low   Monocytes 0.00 - 1.50 10^3/uL 0.24   Eosinophils 0.00 - 0.55 10^3/uL 0.21   Basophils 0.00 - 0.09 10^3/uL 0.01   Neutrophil % 32.0 - 70.0 % 71.3High   Lymphocyte % 10.0 - 50.0 % 17.8   Monocyte % 4.0 - 13.0 % 5.7   Eosinophil % 1.0 - 5.0 % 5.0   Basophil% 0.0 - 2.0 % 0.2   Immature  Granulocyte % <=0.7 % 0.0   Immature Granulocyte Count <=0.06 10^3/L 0.00   Resulting Agency  [place] - [facility]  Specimen Collected: 08/24/19 7:26 AM Last Resulted: 08/24/19 8:21 AM  Received From: [place]  Result Received: 09/14/19 8:45 AM   Specimen:  Blood  Ref Range & Units 5 mo ago  Cholesterol, Total 100 - 200 mg/dL 111   Triglyceride 35 - 199 mg/dL 79   HDL (High Density Lipoprotein) Cholesterol 29.0 - 71.0 mg/dL 43.0   LDL Calculated 0 - 130 mg/dL 52   VLDL Cholesterol mg/dL 16   Cholesterol/HDL Ratio  2.6   Resulting [place] - LAB  Specimen Collected: 08/24/19 7:26 AM Last Resulted: 08/24/19 9:20 AM  Received From: [place]  Result Received: 09/14/19 8:45 AM  Urinalysis Component     Latest Ref Rng & Units 01/27/2020  Specific Gravity, UA     1.005 - 1.030 1.025  pH, UA     5.0 - 7.5 5.0  Color, UA     Yellow Yellow  Appearance Ur     Clear Clear  Leukocytes,UA     Negative Negative  Protein,UA     Negative/Trace 3+ (A)  Glucose, UA     Negative Negative  Ketones, UA     Negative Negative  RBC, UA  Negative 2+ (A)  Bilirubin, UA     Negative Negative  Urobilinogen, Ur     0.2 - 1.0 mg/dL 0.2  Nitrite, UA     Negative Negative  Microscopic Examination      See below:   Component     Latest Ref Rng & Units 01/27/2020  WBC, UA     0 - 5 /hpf 0-5  RBC     0 - 2 /hpf 3-10 (A)  Epithelial Cells (non renal)     0 - 10 /hpf 0-10  Casts     None seen /lpf Present (A)  Cast Type     N/A Hyaline casts  Bacteria, UA     None seen/Few None seen  I have reviewed the labs.  Assessment & Plan:    1. Microscopic hematuria - former smoker -I explained to the patient that with some microscopic blood in his urinalysis today and that it may be the sign of a GU malignancy, kidney stones, UTI or BPH.  I explained that the AUA recommends that he undergo a CT urogram and cystoscopy for  further evaluation of the microscopic hematuria.  I explained the CT urogram consists of a CAT scan with the injection of contrast dye, which he states he has had previously with no allergic reactions.  He also denies any allergies to shellfish or iodine.  He will then return to review the results of the CT urogram and undergo a cystoscopy.  I explained that this consists of numbing the penis with lidocaine jelly and then passing a camera up through his penis into his bladder.  I stated that this procedure can have some discomfort in the form of a pinching sensation when the scope first enters into the bladder and then pressure to urinate as the bladder is filled with water.  I explained that after the procedure he may have some dysuria and even gross hematuria, but it should abate by the end of the day. He he understands and wishes to proceed with the recommended tests Urine is sent for culture, and he will be scheduled for CT urogram and cystoscopy with one of the physicians  2. BPH with LUTS - IPSS score is 8/2, it is improved - Continue conservative management, avoiding bladder irritants and timed voiding's - Most bothersome symptom of frequency secondary to fluid pills - Continue tamsulosin 0.4 mg daily  Return for CT Urogram report and cystoscopy for micro heme.  These notes generated with voice recognition software. I apologize for typographical errors.  Zara Council, PA-C  [facility] [address] [place], [place] [address] [phone]

## 2020-01-27 ENCOUNTER — Encounter: Payer: Self-pay | Admitting: Urology

## 2020-01-27 ENCOUNTER — Ambulatory Visit: Payer: Medicare HMO | Admitting: Urology

## 2020-01-27 ENCOUNTER — Other Ambulatory Visit: Payer: Self-pay

## 2020-01-27 VITALS — BP 116/72 | HR 134 | Ht 71.0 in | Wt 188.0 lb

## 2020-01-27 DIAGNOSIS — N138 Other obstructive and reflux uropathy: Secondary | ICD-10-CM | POA: Diagnosis not present

## 2020-01-27 DIAGNOSIS — R3129 Other microscopic hematuria: Secondary | ICD-10-CM

## 2020-01-27 DIAGNOSIS — N401 Enlarged prostate with lower urinary tract symptoms: Secondary | ICD-10-CM

## 2020-01-27 LAB — BLADDER SCAN AMB NON-IMAGING: Scan Result: 119

## 2020-01-28 DIAGNOSIS — I10 Essential (primary) hypertension: Secondary | ICD-10-CM | POA: Diagnosis not present

## 2020-01-28 DIAGNOSIS — R809 Proteinuria, unspecified: Secondary | ICD-10-CM | POA: Diagnosis not present

## 2020-01-28 DIAGNOSIS — N184 Chronic kidney disease, stage 4 (severe): Secondary | ICD-10-CM | POA: Diagnosis not present

## 2020-01-28 DIAGNOSIS — D472 Monoclonal gammopathy: Secondary | ICD-10-CM | POA: Diagnosis not present

## 2020-01-28 LAB — MICROSCOPIC EXAMINATION: Bacteria, UA: NONE SEEN

## 2020-01-29 LAB — URINALYSIS, COMPLETE
Bilirubin, UA: NEGATIVE
Glucose, UA: NEGATIVE
Ketones, UA: NEGATIVE
Leukocytes,UA: NEGATIVE
Nitrite, UA: NEGATIVE
Specific Gravity, UA: 1.025 (ref 1.005–1.030)
Urobilinogen, Ur: 0.2 mg/dL (ref 0.2–1.0)
pH, UA: 5 (ref 5.0–7.5)

## 2020-01-29 LAB — MICROSCOPIC EXAMINATION

## 2020-01-31 LAB — CULTURE, URINE COMPREHENSIVE

## 2020-02-01 DIAGNOSIS — L570 Actinic keratosis: Secondary | ICD-10-CM | POA: Diagnosis not present

## 2020-02-12 DIAGNOSIS — I10 Essential (primary) hypertension: Secondary | ICD-10-CM | POA: Diagnosis not present

## 2020-02-12 DIAGNOSIS — N184 Chronic kidney disease, stage 4 (severe): Secondary | ICD-10-CM | POA: Diagnosis not present

## 2020-02-22 DIAGNOSIS — I700 Atherosclerosis of aorta: Secondary | ICD-10-CM | POA: Diagnosis not present

## 2020-02-22 DIAGNOSIS — I4821 Permanent atrial fibrillation: Secondary | ICD-10-CM | POA: Diagnosis not present

## 2020-02-22 DIAGNOSIS — N184 Chronic kidney disease, stage 4 (severe): Secondary | ICD-10-CM | POA: Diagnosis not present

## 2020-02-22 DIAGNOSIS — I5022 Chronic systolic (congestive) heart failure: Secondary | ICD-10-CM | POA: Diagnosis not present

## 2020-02-22 DIAGNOSIS — I129 Hypertensive chronic kidney disease with stage 1 through stage 4 chronic kidney disease, or unspecified chronic kidney disease: Secondary | ICD-10-CM | POA: Diagnosis not present

## 2020-02-22 DIAGNOSIS — R7309 Other abnormal glucose: Secondary | ICD-10-CM | POA: Diagnosis not present

## 2020-02-24 ENCOUNTER — Other Ambulatory Visit: Payer: Self-pay

## 2020-02-24 ENCOUNTER — Ambulatory Visit
Admission: RE | Admit: 2020-02-24 | Discharge: 2020-02-24 | Disposition: A | Discharge status: Home / Self Care | Payer: Medicare HMO | Source: Ambulatory Visit | Attending: Urology | Admitting: Urology

## 2020-02-24 DIAGNOSIS — K439 Ventral hernia without obstruction or gangrene: Secondary | ICD-10-CM | POA: Diagnosis not present

## 2020-02-24 DIAGNOSIS — R3129 Other microscopic hematuria: Secondary | ICD-10-CM | POA: Diagnosis not present

## 2020-02-24 DIAGNOSIS — K4020 Bilateral inguinal hernia, without obstruction or gangrene, not specified as recurrent: Secondary | ICD-10-CM | POA: Diagnosis not present

## 2020-02-24 DIAGNOSIS — N200 Calculus of kidney: Secondary | ICD-10-CM | POA: Diagnosis not present

## 2020-02-29 DIAGNOSIS — I4821 Permanent atrial fibrillation: Secondary | ICD-10-CM | POA: Diagnosis not present

## 2020-02-29 DIAGNOSIS — I130 Hypertensive heart and chronic kidney disease with heart failure and stage 1 through stage 4 chronic kidney disease, or unspecified chronic kidney disease: Secondary | ICD-10-CM | POA: Diagnosis not present

## 2020-02-29 DIAGNOSIS — R7303 Prediabetes: Secondary | ICD-10-CM | POA: Diagnosis not present

## 2020-02-29 DIAGNOSIS — I2510 Atherosclerotic heart disease of native coronary artery without angina pectoris: Secondary | ICD-10-CM | POA: Diagnosis not present

## 2020-02-29 DIAGNOSIS — I700 Atherosclerosis of aorta: Secondary | ICD-10-CM | POA: Diagnosis not present

## 2020-02-29 DIAGNOSIS — E7800 Pure hypercholesterolemia, unspecified: Secondary | ICD-10-CM | POA: Diagnosis not present

## 2020-02-29 DIAGNOSIS — I5022 Chronic systolic (congestive) heart failure: Secondary | ICD-10-CM | POA: Diagnosis not present

## 2020-02-29 DIAGNOSIS — N184 Chronic kidney disease, stage 4 (severe): Secondary | ICD-10-CM | POA: Diagnosis not present

## 2020-03-02 ENCOUNTER — Other Ambulatory Visit: Payer: Self-pay

## 2020-03-02 ENCOUNTER — Encounter: Payer: Self-pay | Admitting: Urology

## 2020-03-02 ENCOUNTER — Ambulatory Visit: Payer: Medicare HMO | Admitting: Urology

## 2020-03-02 VITALS — BP 138/86 | HR 52 | Ht 70.0 in | Wt 194.0 lb

## 2020-03-02 DIAGNOSIS — R3129 Other microscopic hematuria: Secondary | ICD-10-CM

## 2020-03-02 DIAGNOSIS — N138 Other obstructive and reflux uropathy: Secondary | ICD-10-CM

## 2020-03-02 NOTE — Patient Instructions (Signed)
Prostatic Urethral Lift  Prostatic urethral lift is a surgical procedure to relieve symptoms of prostate gland enlargement that occurs with age (benign prostatic hypertrophy, BPH). The part of the body that drains urine from the bladder (urethra) passes between the two lobes of the prostate. As the prostate enlarges, it can push on the urethra and cause problems with urinating. This procedure involves placing an implant that holds the prostate away from the urethra. The procedure is performed with a thin operating telescope (cystoscope) that is inserted through the tip of the penis and moved up the urethra to the prostate. This is less invasive than other procedures that require an incision. You may have this procedure if:  You have symptoms of BPH.  Your prostate is not severely enlarged.  Medicines to treat BPH are not working or not tolerated.  You want to avoid possible sexual side effects from medicines or other procedures that are used to treat BPH. Tell a health care provider about:  Any allergies you have.  All medicines you are taking, including vitamins, herbs, eye drops, creams, and over-the-counter medicines.  Previous problems you or members of your family have had with the use of anesthetics.  Any blood disorders you have.  Previous surgeries you have had.  Any medical conditions you have. What are the risks?  Bleeding.  Infection.  Leaking of urine (incontinence).  Allergic reactions to medicines.  Return of BPH symptoms after 2 years, requiring more treatment. What happens before the procedure?  Ask your health care provider about: ? Changing or stopping your regular medicines. This is especially important if you are taking diabetes medicines or blood thinners. ? Taking medicines such as aspirin and ibuprofen. These medicines can thin your blood. Do not take these medicines before your procedure if your health care provider instructs you not to.  Follow  instructions from your health care provider about eating or drinking restrictions.  Plan to have someone take you home from the hospital or clinic. What happens during the procedure?  To lower your risk of infection: ? Your health care team will wash or sanitize their hands. ? Your skin will be washed with soap.  An IV may be inserted into one of your veins.  You will be given one or more of the following: ? A medicine to help you relax (sedative). ? A medicine that is injected into your urethra to numb the area (local anesthetic). ? A medicine to make you fall asleep (general anesthetic).  A cystoscope will be inserted into your penis and moved through your urethra to your prostate.  A device will be inserted through the cystoscope and used to press the lobes of your prostate away from your urethra.  Implants will be inserted through the device to hold the lobes of your prostate in the widened position.  The device and cystoscope will be removed. The procedure may vary among health care providers and hospitals. What happens after the procedure?  Your blood pressure, heart rate, breathing rate, and blood oxygen level will be monitored until the medicines you were given have worn off.  Do not drive for 24 hours if you were given a sedative. Summary  Prostatic urethral lift is a surgical procedure to relieve symptoms of prostate gland enlargement that occurs with age (benign prostatic hypertrophy, BPH).  The procedure is performed with a thin operating telescope (cystoscope) that is inserted through the tip of the penis and moved up the part of the body that drains   urine from the bladder (urethra) to reach the prostate. This is less invasive than other procedures that require an incision.  Plan to have someone take you home from the hospital or clinic. You will not be allowed to drive for 24 hours if you were given a sedative during the procedure. This information is not intended to  replace advice given to you by your health care provider. Make sure you discuss any questions you have with your health care provider. Document Revised: 08/27/2019 Document Reviewed: 08/27/2019 Elsevier Patient Education  2021 [employer]  

## 2020-03-02 NOTE — Progress Notes (Signed)
Cystoscopy Procedure Note:  Indication: Microscopic hematuria  After informed consent and discussion of the procedure and its risks, Lawrence Perkins. was positioned and prepped in the standard fashion. Cystoscopy was performed with a flexible cystoscope. The urethra, bladder neck and entire bladder was visualized in a standard fashion. The prostate was moderate in size with obstructing lateral lobes. The ureteral orifices were visualized in their normal location and orientation.  Moderate bladder trabeculations, no suspicious bladder lesions, no abnormalities on retroflexion.  There is a very subtle dilation of the ejaculatory duct, but no papillary lesions within the prostatic urethra.  Imaging: I personally viewed and interpreted the CT abdomen pelvis without contrast(CKD) which shows no suspicious urologic lesions, and prostate measured 40 g  Findings: Normal cystoscopy, subtle dilation of ejaculatory duct but no definite papillary tumors  Assessment and Plan: Send voided cytology, consider biopsy of slightly abnormal appearing ejaculatory duct if cytology suspicious RTC 3 months IPSS/PVR, discuss UroLift further at that visit if persistent urinary symptoms despite Flomax  Nickolas Madrid, MD 03/02/2020

## 2020-03-03 DIAGNOSIS — N184 Chronic kidney disease, stage 4 (severe): Secondary | ICD-10-CM | POA: Diagnosis not present

## 2020-03-03 LAB — MICROSCOPIC EXAMINATION

## 2020-03-03 LAB — URINALYSIS, COMPLETE
Bilirubin, UA: NEGATIVE
Glucose, UA: NEGATIVE
Ketones, UA: NEGATIVE
Leukocytes,UA: NEGATIVE
Nitrite, UA: NEGATIVE
Specific Gravity, UA: 1.02 (ref 1.005–1.030)
Urobilinogen, Ur: 0.2 mg/dL (ref 0.2–1.0)
pH, UA: 5 (ref 5.0–7.5)

## 2020-03-03 LAB — CYTOLOGY - NON PAP

## 2020-03-08 ENCOUNTER — Telehealth: Payer: Self-pay

## 2020-03-08 NOTE — Telephone Encounter (Signed)
-----   Message from Billey Co, MD sent at 03/08/2020  8:31 AM EST ----- No worrisome cells on urine cytology, keep follow up as scheduled  Nickolas Madrid, MD 03/08/2020

## 2020-03-08 NOTE — Telephone Encounter (Signed)
Called pt informed him of the information below. Pt gave verbal understanding.  

## 2020-03-10 ENCOUNTER — Other Ambulatory Visit: Payer: Self-pay | Admitting: Urology

## 2020-05-10 ENCOUNTER — Ambulatory Visit: Payer: Medicare HMO | Attending: Internal Medicine

## 2020-05-10 DIAGNOSIS — Z23 Encounter for immunization: Secondary | ICD-10-CM

## 2020-05-10 NOTE — Progress Notes (Signed)
   [id number]  Name:  Lawrence Perkins.    MRN: [id number] DOB: [birth year]  05/10/2020  [name] was observed post Covid-19 immunization for 15 minutes without incident. He was provided with Vaccine Information Sheet and instruction to access the V-Safe system.   [name] was instructed to call 911 with any severe reactions post vaccine: [name] Difficulty breathing  . Swelling of face and throat  . A fast heartbeat  . A bad rash all over body  . Dizziness and weakness   Immunizations Administered    Name Date Dose VIS Date Route   PFIZER Comrnaty(Gray TOP) Covid-19 Vaccine 05/10/2020 11:01 AM 0.3 mL 12/10/2019 Intramuscular   Manufacturer: [employer]   Lot: [id number]   NDC: [place], PharmD, MBA Clinical Acute Care Pharmacist

## 2020-05-11 ENCOUNTER — Other Ambulatory Visit: Payer: Self-pay

## 2020-05-11 MED ORDER — PFIZER-BIONT COVID-19 VAC-TRIS 30 MCG/0.3ML IM SUSP
INTRAMUSCULAR | 0 refills | Status: DC
  Filled 2020-05-11: qty 0.3, 1d supply, fill #0

## 2020-05-17 DIAGNOSIS — N184 Chronic kidney disease, stage 4 (severe): Secondary | ICD-10-CM | POA: Diagnosis not present

## 2020-05-17 DIAGNOSIS — R809 Proteinuria, unspecified: Secondary | ICD-10-CM | POA: Diagnosis not present

## 2020-05-19 DIAGNOSIS — N184 Chronic kidney disease, stage 4 (severe): Secondary | ICD-10-CM | POA: Diagnosis not present

## 2020-05-19 DIAGNOSIS — R809 Proteinuria, unspecified: Secondary | ICD-10-CM | POA: Diagnosis not present

## 2020-05-19 DIAGNOSIS — I10 Essential (primary) hypertension: Secondary | ICD-10-CM | POA: Diagnosis not present

## 2020-05-19 DIAGNOSIS — I4891 Unspecified atrial fibrillation: Secondary | ICD-10-CM | POA: Diagnosis not present

## 2020-06-01 ENCOUNTER — Ambulatory Visit: Payer: Medicare HMO | Admitting: Urology

## 2020-06-01 ENCOUNTER — Encounter: Payer: Self-pay | Admitting: Urology

## 2020-06-01 ENCOUNTER — Other Ambulatory Visit: Payer: Self-pay

## 2020-06-01 VITALS — BP 132/65 | HR 59 | Ht 71.0 in

## 2020-06-01 DIAGNOSIS — N138 Other obstructive and reflux uropathy: Secondary | ICD-10-CM

## 2020-06-01 DIAGNOSIS — N401 Enlarged prostate with lower urinary tract symptoms: Secondary | ICD-10-CM

## 2020-06-01 MED ORDER — TAMSULOSIN HCL 0.4 MG PO CAPS: 0.4000 mg | ORAL_CAPSULE | Freq: Every day | ORAL | 3 refills | Status: DC

## 2020-06-01 NOTE — Patient Instructions (Signed)

## 2020-06-01 NOTE — Progress Notes (Signed)
   06/01/2020 9:57 AM   Lawrence Perkins. December 10, 1940 [id number]  Reason for visit: Follow up BPH  HPI: 80 year old male with long history of urinary frequency and nocturia 2 times per night, also takes torsemide 40 mg daily in the morning.  He has been well controlled on Flomax.  He underwent a cystoscopy in March 2022 that showed a moderate size prostate with obstructing lateral lobes, and prostate measured 40 g on recent CT, no hydronephrosis was seen.  He reports his symptoms are stable on Flomax.  IPSS score today is 6, with quality of life mostly satisfied.  His PVR is mildly elevated at 190 mL, but he reports he was standing to void, when typically he feels he empties better when he sits.  He is minimally bothered by his symptoms.  We discussed options at length including continuing with Flomax, changing to alfuzosin to see if he has any symptom improvement, and outlet procedures like UroLift.  Risks and benefits discussed at length.  He would like to continue with the Flomax.  Return precautions discussed extensively including gross hematuria, worsening urinary symptoms, UTIs, or urinary retention.  RTC with Larene [place] 1 year IPSS, PVR Continue Flomax   Billey Co, MD  [facility] [address] [place], [place] [address] [phone]

## 2020-06-14 DIAGNOSIS — I5022 Chronic systolic (congestive) heart failure: Secondary | ICD-10-CM | POA: Diagnosis not present

## 2020-06-14 DIAGNOSIS — I129 Hypertensive chronic kidney disease with stage 1 through stage 4 chronic kidney disease, or unspecified chronic kidney disease: Secondary | ICD-10-CM | POA: Diagnosis not present

## 2020-06-14 DIAGNOSIS — N184 Chronic kidney disease, stage 4 (severe): Secondary | ICD-10-CM | POA: Diagnosis not present

## 2020-06-14 DIAGNOSIS — I2510 Atherosclerotic heart disease of native coronary artery without angina pectoris: Secondary | ICD-10-CM | POA: Diagnosis not present

## 2020-06-14 DIAGNOSIS — I700 Atherosclerosis of aorta: Secondary | ICD-10-CM | POA: Diagnosis not present

## 2020-06-14 DIAGNOSIS — I4821 Permanent atrial fibrillation: Secondary | ICD-10-CM | POA: Diagnosis not present

## 2020-08-24 DIAGNOSIS — I700 Atherosclerosis of aorta: Secondary | ICD-10-CM | POA: Diagnosis not present

## 2020-08-24 DIAGNOSIS — I129 Hypertensive chronic kidney disease with stage 1 through stage 4 chronic kidney disease, or unspecified chronic kidney disease: Secondary | ICD-10-CM | POA: Diagnosis not present

## 2020-08-24 DIAGNOSIS — R7309 Other abnormal glucose: Secondary | ICD-10-CM | POA: Diagnosis not present

## 2020-08-24 DIAGNOSIS — N184 Chronic kidney disease, stage 4 (severe): Secondary | ICD-10-CM | POA: Diagnosis not present

## 2020-08-30 DIAGNOSIS — H1045 Other chronic allergic conjunctivitis: Secondary | ICD-10-CM | POA: Diagnosis not present

## 2020-08-30 DIAGNOSIS — H02889 Meibomian gland dysfunction of unspecified eye, unspecified eyelid: Secondary | ICD-10-CM | POA: Diagnosis not present

## 2020-08-31 DIAGNOSIS — N184 Chronic kidney disease, stage 4 (severe): Secondary | ICD-10-CM | POA: Diagnosis not present

## 2020-08-31 DIAGNOSIS — Z0000 Encounter for general adult medical examination without abnormal findings: Secondary | ICD-10-CM | POA: Diagnosis not present

## 2020-08-31 DIAGNOSIS — I5022 Chronic systolic (congestive) heart failure: Secondary | ICD-10-CM | POA: Diagnosis not present

## 2020-08-31 DIAGNOSIS — I130 Hypertensive heart and chronic kidney disease with heart failure and stage 1 through stage 4 chronic kidney disease, or unspecified chronic kidney disease: Secondary | ICD-10-CM | POA: Diagnosis not present

## 2020-08-31 DIAGNOSIS — I4821 Permanent atrial fibrillation: Secondary | ICD-10-CM | POA: Diagnosis not present

## 2020-08-31 DIAGNOSIS — R7303 Prediabetes: Secondary | ICD-10-CM | POA: Diagnosis not present

## 2020-08-31 DIAGNOSIS — I700 Atherosclerosis of aorta: Secondary | ICD-10-CM | POA: Diagnosis not present

## 2020-08-31 DIAGNOSIS — I2510 Atherosclerotic heart disease of native coronary artery without angina pectoris: Secondary | ICD-10-CM | POA: Diagnosis not present

## 2020-09-20 ENCOUNTER — Other Ambulatory Visit: Payer: Self-pay

## 2020-09-20 ENCOUNTER — Inpatient Hospital Stay: Payer: Medicare HMO | Attending: Oncology

## 2020-09-20 ENCOUNTER — Ambulatory Visit: Payer: Medicare HMO | Attending: Internal Medicine

## 2020-09-20 DIAGNOSIS — N400 Enlarged prostate without lower urinary tract symptoms: Secondary | ICD-10-CM | POA: Insufficient documentation

## 2020-09-20 DIAGNOSIS — Z23 Encounter for immunization: Secondary | ICD-10-CM

## 2020-09-20 DIAGNOSIS — Z79899 Other long term (current) drug therapy: Secondary | ICD-10-CM | POA: Insufficient documentation

## 2020-09-20 DIAGNOSIS — I4891 Unspecified atrial fibrillation: Secondary | ICD-10-CM | POA: Insufficient documentation

## 2020-09-20 DIAGNOSIS — Z7901 Long term (current) use of anticoagulants: Secondary | ICD-10-CM | POA: Diagnosis not present

## 2020-09-20 DIAGNOSIS — D472 Monoclonal gammopathy: Secondary | ICD-10-CM | POA: Diagnosis not present

## 2020-09-20 DIAGNOSIS — N189 Chronic kidney disease, unspecified: Secondary | ICD-10-CM | POA: Diagnosis not present

## 2020-09-20 DIAGNOSIS — I110 Hypertensive heart disease with heart failure: Secondary | ICD-10-CM | POA: Diagnosis not present

## 2020-09-20 DIAGNOSIS — I509 Heart failure, unspecified: Secondary | ICD-10-CM | POA: Diagnosis not present

## 2020-09-20 LAB — CBC WITH DIFFERENTIAL/PLATELET
Abs Immature Granulocytes: 0.01 10*3/uL (ref 0.00–0.07)
Basophils Absolute: 0 10*3/uL (ref 0.0–0.1)
Basophils Relative: 0 %
Eosinophils Absolute: 0.2 10*3/uL (ref 0.0–0.5)
Eosinophils Relative: 3 %
HCT: 35.4 % — ABNORMAL LOW (ref 39.0–52.0)
Hemoglobin: 11.9 g/dL — ABNORMAL LOW (ref 13.0–17.0)
Immature Granulocytes: 0 %
Lymphocytes Relative: 14 %
Lymphs Abs: 0.8 10*3/uL (ref 0.7–4.0)
MCH: 33.4 pg (ref 26.0–34.0)
MCHC: 33.6 g/dL (ref 30.0–36.0)
MCV: 99.4 fL (ref 80.0–100.0)
Monocytes Absolute: 0.4 10*3/uL (ref 0.1–1.0)
Monocytes Relative: 6 %
Neutro Abs: 4.2 10*3/uL (ref 1.7–7.7)
Neutrophils Relative %: 77 %
Platelets: 193 10*3/uL (ref 150–400)
RBC: 3.56 MIL/uL — ABNORMAL LOW (ref 4.22–5.81)
RDW: 12.8 % (ref 11.5–15.5)
WBC: 5.6 10*3/uL (ref 4.0–10.5)
nRBC: 0 % (ref 0.0–0.2)

## 2020-09-20 LAB — BASIC METABOLIC PANEL
Anion gap: 6 (ref 5–15)
BUN: 58 mg/dL — ABNORMAL HIGH (ref 8–23)
CO2: 27 mmol/L (ref 22–32)
Calcium: 8.6 mg/dL — ABNORMAL LOW (ref 8.9–10.3)
Chloride: 105 mmol/L (ref 98–111)
Creatinine, Ser: 3.33 mg/dL — ABNORMAL HIGH (ref 0.61–1.24)
GFR, Estimated: 18 mL/min — ABNORMAL LOW (ref >60)
Glucose, Bld: 91 mg/dL (ref 70–99)
Potassium: 4.7 mmol/L (ref 3.5–5.1)
Sodium: 138 mmol/L (ref 135–145)

## 2020-09-20 MED ORDER — PFIZER COVID-19 VAC BIVALENT 30 MCG/0.3ML IM SUSP
INTRAMUSCULAR | 0 refills | Status: DC
  Filled 2020-09-20: qty 0.3, 1d supply, fill #0

## 2020-09-20 NOTE — Progress Notes (Signed)
   [id number]  Name:  Lawrence Perkins.    MRN: [id number] DOB: 1940-03-06  09/20/2020  Mr. [name] was observed post Covid-19 immunization for 15 minutes without incident. He was provided with Vaccine Information Sheet and instruction to access the V-Safe system.   Mr. [name] was instructed to call 911 with any severe reactions post vaccine: Difficulty breathing  Swelling of face and throat  A fast heartbeat  A bad rash all over body  Dizziness and weakness   Lu Duffel, PharmD, MBA Clinical Acute Care Pharmacist

## 2020-09-21 LAB — IGG, IGA, IGM
IgA: 420 mg/dL (ref 61–437)
IgG (Immunoglobin G), Serum: 729 mg/dL (ref 603–1613)
IgM (Immunoglobulin M), Srm: 40 mg/dL (ref 15–143)

## 2020-09-21 LAB — KAPPA/LAMBDA LIGHT CHAINS
Kappa free light chain: 107 mg/L — ABNORMAL HIGH (ref 3.3–19.4)
Kappa, lambda light chain ratio: 0.5 (ref 0.26–1.65)
Lambda free light chains: 214.6 mg/L — ABNORMAL HIGH (ref 5.7–26.3)

## 2020-09-22 DIAGNOSIS — I10 Essential (primary) hypertension: Secondary | ICD-10-CM | POA: Diagnosis not present

## 2020-09-22 DIAGNOSIS — N184 Chronic kidney disease, stage 4 (severe): Secondary | ICD-10-CM | POA: Diagnosis not present

## 2020-09-22 LAB — PROTEIN ELECTROPHORESIS, SERUM
A/G Ratio: 1.3 (ref 0.7–1.7)
Albumin ELP: 3.4 g/dL (ref 2.9–4.4)
Alpha-1-Globulin: 0.2 g/dL (ref 0.0–0.4)
Alpha-2-Globulin: 0.6 g/dL (ref 0.4–1.0)
Beta Globulin: 0.9 g/dL (ref 0.7–1.3)
Gamma Globulin: 0.8 g/dL (ref 0.4–1.8)
Globulin, Total: 2.6 g/dL (ref 2.2–3.9)
M-Spike, %: 0.4 g/dL — ABNORMAL HIGH
Total Protein ELP: 6 g/dL (ref 6.0–8.5)

## 2020-09-24 NOTE — Progress Notes (Signed)
Lawrence Perkins  [phone] [phone]  ID: [name] OB: [birth year]  MR#: [id number]  AOZ#:[id number]  Patient Care Team: Kirk Ruths, MD as PCP - General (Internal Medicine)  CHIEF COMPLAINT: MGUS.  INTERVAL HISTORY: Patient returns to clinic today for repeat laboratory work and routine yearly evaluation.  He continues to feel well and remains asymptomatic. He has no neurologic complaints.  He denies any pain. He has a good appetite and denies weight loss.  He denies any chest pain, shortness of breath, cough, or hemoptysis.  He denies any nausea, vomiting, constipation, or diarrhea. He has no urinary complaints.  Patient feels at his baseline offers no specific complaints today.  REVIEW OF SYSTEMS:   Review of Systems  Constitutional: Negative.  Negative for fever, malaise/fatigue and weight loss.  Respiratory: Negative.  Negative for cough and shortness of breath.   Cardiovascular: Negative.  Negative for chest pain and leg swelling.  Gastrointestinal: Negative.  Negative for abdominal pain.  Genitourinary: Negative.  Negative for dysuria.  Musculoskeletal: Negative.  Negative for back pain.  Skin: Negative.  Negative for rash.  Neurological: Negative.  Negative for focal weakness, weakness and headaches.  Endo/Heme/Allergies:  Does not bruise/bleed easily.  Psychiatric/Behavioral: Negative.  The patient is not nervous/anxious.    As per HPI. Otherwise, a complete review of systems is negative.  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   MGUS (monoclonal gammopathy of unknown significance)    Pleural effusion    Prostatism    Rectal bleeding     PAST SURGICAL HISTORY: Past Surgical History:  Procedure Laterality Date   ANKLE SURGERY     following volleyball injury   APPENDECTOMY     COLONOSCOPY     COLONOSCOPY WITH PROPOFOL N/A 01/02/2017   Procedure: COLONOSCOPY WITH PROPOFOL;  Surgeon: Manya Silvas, MD;  Location: [facility] ENDOSCOPY;  Service: Endoscopy;  Laterality: N/A;   JOINT REPLACEMENT     RIGHT TOTAL HIP   TONSILLECTOMY     TOTAL HIP ARTHROPLASTY  09/2009   Right femoral neck fracture   TOTAL HIP ARTHROPLASTY Left 03/06/2016   Procedure: TOTAL HIP ARTHROPLASTY ANTERIOR APPROACH;  Surgeon: Hessie Knows, MD;  Location: [facility] ORS;  Service: Orthopedics;  Laterality: Left;    FAMILY HISTORY: Family History  Problem Relation Age of Onset   Cirrhosis Mother    Other Father        Lung Fibrosis   Prostate cancer Neg Hx    Kidney cancer Neg Hx    Bladder Cancer Neg Hx     ADVANCED DIRECTIVES (Y/N):  N  HEALTH MAINTENANCE: Social History   Tobacco Use   Smoking status: Former    Years: 15.00    Types: Cigarettes    Quit date: 01/01/1981    Years since quitting: 39.7   Smokeless tobacco: Never  Vaping Use   Vaping Use: Never used  Substance Use Topics   Alcohol use: Yes  Alcohol/week: 2.0 standard drinks    Types: 2 Cans of beer per week   Drug use: No     Colonoscopy:  PAP:  Bone density:  Lipid panel:  No Known Allergies  Current Outpatient Medications  Medication Sig Dispense Refill   apixaban (ELIQUIS) 5 MG TABS tablet Take 1 tablet by mouth 2 (two) times daily.     carvedilol (COREG) 3.125 MG tablet Take 3.125 mg by mouth 2 (two) times daily with a meal.      Cholecalciferol (VITAMIN D3) 2000 units capsule Take by mouth.     COVID-19 mRNA bivalent vaccine, Pfizer, (PFIZER COVID-19 VAC BIVALENT) injection Inject into the muscle. 0.3 mL 0   COVID-19  mRNA Vac-TriS, Pfizer, (PFIZER-BIONT COVID-19 VAC-TRIS) SUSP injection Inject into the muscle. 0.3 mL 0   hydrALAZINE (APRESOLINE) 50 MG tablet Take 50 mg by mouth 2 (two) times daily.     rosuvastatin (CRESTOR) 10 MG tablet Take 10 mg by mouth daily.     tamsulosin (FLOMAX) 0.4 MG CAPS capsule Take 1 capsule (0.4 mg total) by mouth daily. 90 capsule 3   torsemide (DEMADEX) 20 MG tablet Take 2 tablets (40 mg total) by mouth daily. 60 tablet 0   No current facility-administered medications for this visit.    OBJECTIVE: Vitals:   09/27/20 1100  BP: 137/82  Pulse: 75  Resp: 16  Temp: 97.9 F (36.6 C)  SpO2: 99%     Body mass index is 26.97 kg/m.    ECOG FS:0 - Asymptomatic  General: Well-developed, well-nourished, no acute distress. Eyes: Pink conjunctiva, anicteric sclera. HEENT: Normocephalic, moist mucous membranes. Lungs: No audible wheezing or coughing. Heart: Regular rate and rhythm. Abdomen: Soft, nontender, no obvious distention. Musculoskeletal: No edema, cyanosis, or clubbing. Neuro: Alert, answering all questions appropriately. Cranial nerves grossly intact. Skin: No rashes or petechiae noted. Psych: Normal affect.  LAB RESULTS:  Lab Results  Component Value Date   NA 138 09/20/2020   K 4.7 09/20/2020   CL 105 09/20/2020   CO2 27 09/20/2020   GLUCOSE 91 09/20/2020   BUN 58 (H) 09/20/2020   CREATININE 3.33 (H) 09/20/2020   CALCIUM 8.6 (L) 09/20/2020   PROT 7.3 02/17/2018   ALBUMIN 4.1 02/17/2018   AST 12 (L) 02/17/2018   ALT 11 02/17/2018   ALKPHOS 57 02/17/2018   BILITOT 0.9 02/17/2018   GFRNONAA 18 (L) 09/20/2020   GFRAA 22 (L) 09/15/2019    Lab Results  Component Value Date   WBC 5.6 09/20/2020   NEUTROABS 4.2 09/20/2020   HGB 11.9 (L) 09/20/2020   HCT 35.4 (L) 09/20/2020   MCV 99.4 09/20/2020   PLT 193 09/20/2020   Lab Results  Component Value Date   TOTALPROTELP 6.0 09/20/2020   ALBUMINELP 3.4 09/20/2020   A1GS 0.2 09/20/2020   A2GS 0.6  09/20/2020   BETS 0.9 09/20/2020   GAMS 0.8 09/20/2020   MSPIKE 0.4 (H) 09/20/2020   SPEI Comment 09/20/2020     STUDIES: No results found.  ASSESSMENT: MGUS  PLAN:    1. MGUS: Patient had a bone marrow biopsy on November 03, 2015 that revealed only a mild increased plasma cell population of approximately 5-10%. Patient was also noted to have cytogenetic abnormality of a monosomy 60 which is an average risk of multiple myeloma.  Patient's M spike has ranged between 0.3 and 0.6 since January 2018.  Today's result is 0.4.  Immunoglobulins continue to be within normal limits.  Both kappa and lambda free  light chains are chronically elevated, but his ratio is normal.  Given his bone marrow results, patient is at mild to moderate risk to progress to multiple myeloma. Will consider a metastatic bone survey in the future if there is suspicion of progression of disease.  No intervention is needed at this time.  Return to clinic in 1 year for repeat laboratory work and routine evaluation. 2. Renal insufficiency: Chronic and unchanged.  Unlikely related to underlying MGUS or plasma cell population seen in bone marrow biopsy.  Continue to follow-up with nephrology as scheduled.  Patient reports he has an appointment later this week. 3.  Anemia: Nearly resolved.  Patient's most recent hemoglobin is 11.9.   Patient expressed understanding and was in agreement with this plan. He also understands that He can call clinic at any time with any questions, concerns, or complaints.    Lloyd Huger, MD   09/27/2020 11:51 AM

## 2020-09-27 ENCOUNTER — Inpatient Hospital Stay: Payer: Medicare HMO | Admitting: Oncology

## 2020-09-27 VITALS — BP 137/82 | HR 75 | Temp 97.9°F | Resp 16 | Wt 193.4 lb

## 2020-09-27 DIAGNOSIS — I110 Hypertensive heart disease with heart failure: Secondary | ICD-10-CM | POA: Diagnosis not present

## 2020-09-27 DIAGNOSIS — D472 Monoclonal gammopathy: Secondary | ICD-10-CM | POA: Diagnosis not present

## 2020-09-27 DIAGNOSIS — Z79899 Other long term (current) drug therapy: Secondary | ICD-10-CM | POA: Diagnosis not present

## 2020-09-27 DIAGNOSIS — N400 Enlarged prostate without lower urinary tract symptoms: Secondary | ICD-10-CM | POA: Diagnosis not present

## 2020-09-27 DIAGNOSIS — I509 Heart failure, unspecified: Secondary | ICD-10-CM | POA: Diagnosis not present

## 2020-09-27 DIAGNOSIS — I4891 Unspecified atrial fibrillation: Secondary | ICD-10-CM | POA: Diagnosis not present

## 2020-09-27 DIAGNOSIS — N189 Chronic kidney disease, unspecified: Secondary | ICD-10-CM | POA: Diagnosis not present

## 2020-09-27 DIAGNOSIS — Z7901 Long term (current) use of anticoagulants: Secondary | ICD-10-CM | POA: Diagnosis not present

## 2020-09-27 NOTE — Progress Notes (Signed)
Pt has no concerns or complaints at this time.

## 2020-09-29 DIAGNOSIS — N184 Chronic kidney disease, stage 4 (severe): Secondary | ICD-10-CM | POA: Diagnosis not present

## 2020-09-29 DIAGNOSIS — R809 Proteinuria, unspecified: Secondary | ICD-10-CM | POA: Diagnosis not present

## 2020-09-29 DIAGNOSIS — I10 Essential (primary) hypertension: Secondary | ICD-10-CM | POA: Diagnosis not present

## 2020-09-30 DIAGNOSIS — M1711 Unilateral primary osteoarthritis, right knee: Secondary | ICD-10-CM | POA: Diagnosis not present

## 2020-11-14 DIAGNOSIS — Z8616 Personal history of COVID-19: Secondary | ICD-10-CM

## 2020-11-14 HISTORY — DX: Personal history of COVID-19: Z86.16

## 2020-12-14 DIAGNOSIS — I2510 Atherosclerotic heart disease of native coronary artery without angina pectoris: Secondary | ICD-10-CM | POA: Diagnosis not present

## 2020-12-14 DIAGNOSIS — I129 Hypertensive chronic kidney disease with stage 1 through stage 4 chronic kidney disease, or unspecified chronic kidney disease: Secondary | ICD-10-CM | POA: Diagnosis not present

## 2020-12-14 DIAGNOSIS — N184 Chronic kidney disease, stage 4 (severe): Secondary | ICD-10-CM | POA: Diagnosis not present

## 2020-12-14 DIAGNOSIS — E7800 Pure hypercholesterolemia, unspecified: Secondary | ICD-10-CM | POA: Diagnosis not present

## 2020-12-14 DIAGNOSIS — I4821 Permanent atrial fibrillation: Secondary | ICD-10-CM | POA: Diagnosis not present

## 2020-12-14 DIAGNOSIS — I700 Atherosclerosis of aorta: Secondary | ICD-10-CM | POA: Diagnosis not present

## 2020-12-21 ENCOUNTER — Other Ambulatory Visit: Payer: Self-pay | Admitting: Orthopedic Surgery

## 2020-12-21 DIAGNOSIS — M1711 Unilateral primary osteoarthritis, right knee: Secondary | ICD-10-CM

## 2021-01-10 DIAGNOSIS — H353131 Nonexudative age-related macular degeneration, bilateral, early dry stage: Secondary | ICD-10-CM | POA: Diagnosis not present

## 2021-01-10 DIAGNOSIS — H2513 Age-related nuclear cataract, bilateral: Secondary | ICD-10-CM | POA: Diagnosis not present

## 2021-01-10 DIAGNOSIS — D3131 Benign neoplasm of right choroid: Secondary | ICD-10-CM | POA: Diagnosis not present

## 2021-01-10 DIAGNOSIS — H35039 Hypertensive retinopathy, unspecified eye: Secondary | ICD-10-CM | POA: Diagnosis not present

## 2021-01-16 ENCOUNTER — Ambulatory Visit: Payer: Medicare HMO

## 2021-01-24 DIAGNOSIS — Z85828 Personal history of other malignant neoplasm of skin: Secondary | ICD-10-CM | POA: Diagnosis not present

## 2021-01-24 DIAGNOSIS — L820 Inflamed seborrheic keratosis: Secondary | ICD-10-CM | POA: Diagnosis not present

## 2021-01-24 DIAGNOSIS — D485 Neoplasm of uncertain behavior of skin: Secondary | ICD-10-CM | POA: Diagnosis not present

## 2021-01-24 DIAGNOSIS — Z85820 Personal history of malignant melanoma of skin: Secondary | ICD-10-CM | POA: Diagnosis not present

## 2021-01-24 DIAGNOSIS — D2261 Melanocytic nevi of right upper limb, including shoulder: Secondary | ICD-10-CM | POA: Diagnosis not present

## 2021-01-24 DIAGNOSIS — D2272 Melanocytic nevi of left lower limb, including hip: Secondary | ICD-10-CM | POA: Diagnosis not present

## 2021-01-30 ENCOUNTER — Ambulatory Visit
Admission: RE | Admit: 2021-01-30 | Discharge: 2021-01-30 | Disposition: A | Discharge status: Home / Self Care | Payer: Medicare HMO | Source: Ambulatory Visit | Attending: Orthopedic Surgery | Admitting: Orthopedic Surgery

## 2021-01-30 ENCOUNTER — Other Ambulatory Visit: Payer: Self-pay

## 2021-01-30 DIAGNOSIS — Z96641 Presence of right artificial hip joint: Secondary | ICD-10-CM | POA: Diagnosis not present

## 2021-01-30 DIAGNOSIS — Z01818 Encounter for other preprocedural examination: Secondary | ICD-10-CM | POA: Diagnosis not present

## 2021-01-30 DIAGNOSIS — M1711 Unilateral primary osteoarthritis, right knee: Secondary | ICD-10-CM | POA: Diagnosis not present

## 2021-02-21 DIAGNOSIS — I2510 Atherosclerotic heart disease of native coronary artery without angina pectoris: Secondary | ICD-10-CM | POA: Diagnosis not present

## 2021-02-21 DIAGNOSIS — I129 Hypertensive chronic kidney disease with stage 1 through stage 4 chronic kidney disease, or unspecified chronic kidney disease: Secondary | ICD-10-CM | POA: Diagnosis not present

## 2021-02-21 DIAGNOSIS — I5022 Chronic systolic (congestive) heart failure: Secondary | ICD-10-CM | POA: Diagnosis not present

## 2021-02-21 DIAGNOSIS — R7303 Prediabetes: Secondary | ICD-10-CM | POA: Diagnosis not present

## 2021-02-21 DIAGNOSIS — N184 Chronic kidney disease, stage 4 (severe): Secondary | ICD-10-CM | POA: Diagnosis not present

## 2021-02-23 ENCOUNTER — Other Ambulatory Visit: Payer: Self-pay | Admitting: Orthopedic Surgery

## 2021-02-28 DIAGNOSIS — I2510 Atherosclerotic heart disease of native coronary artery without angina pectoris: Secondary | ICD-10-CM | POA: Diagnosis not present

## 2021-02-28 DIAGNOSIS — I4821 Permanent atrial fibrillation: Secondary | ICD-10-CM | POA: Diagnosis not present

## 2021-02-28 DIAGNOSIS — I130 Hypertensive heart and chronic kidney disease with heart failure and stage 1 through stage 4 chronic kidney disease, or unspecified chronic kidney disease: Secondary | ICD-10-CM | POA: Diagnosis not present

## 2021-02-28 DIAGNOSIS — N184 Chronic kidney disease, stage 4 (severe): Secondary | ICD-10-CM | POA: Diagnosis not present

## 2021-02-28 DIAGNOSIS — Z87891 Personal history of nicotine dependence: Secondary | ICD-10-CM | POA: Diagnosis not present

## 2021-02-28 DIAGNOSIS — I700 Atherosclerosis of aorta: Secondary | ICD-10-CM | POA: Diagnosis not present

## 2021-02-28 DIAGNOSIS — I5022 Chronic systolic (congestive) heart failure: Secondary | ICD-10-CM | POA: Diagnosis not present

## 2021-03-09 ENCOUNTER — Encounter
Admission: RE | Admit: 2021-03-09 | Discharge: 2021-03-09 | Disposition: A | Discharge status: Home / Self Care | Payer: Medicare HMO | Source: Ambulatory Visit | Attending: Orthopedic Surgery | Admitting: Orthopedic Surgery

## 2021-03-09 ENCOUNTER — Other Ambulatory Visit: Payer: Self-pay

## 2021-03-09 VITALS — BP 139/77 | HR 52 | Resp 16 | Ht 71.0 in | Wt 196.4 lb

## 2021-03-09 DIAGNOSIS — Z01818 Encounter for other preprocedural examination: Secondary | ICD-10-CM | POA: Diagnosis present

## 2021-03-09 DIAGNOSIS — N1831 Chronic kidney disease, stage 3a: Secondary | ICD-10-CM

## 2021-03-09 DIAGNOSIS — Z01812 Encounter for preprocedural laboratory examination: Secondary | ICD-10-CM

## 2021-03-09 DIAGNOSIS — Z01810 Encounter for preprocedural cardiovascular examination: Secondary | ICD-10-CM | POA: Diagnosis not present

## 2021-03-09 HISTORY — DX: Atherosclerotic heart disease of native coronary artery without angina pectoris: I25.10

## 2021-03-09 HISTORY — DX: Unspecified osteoarthritis, unspecified site: M19.90

## 2021-03-09 LAB — CBC WITH DIFFERENTIAL/PLATELET
Abs Immature Granulocytes: 0.01 10*3/uL (ref 0.00–0.07)
Basophils Absolute: 0 10*3/uL (ref 0.0–0.1)
Basophils Relative: 0 %
Eosinophils Absolute: 0.2 10*3/uL (ref 0.0–0.5)
Eosinophils Relative: 3 %
HCT: 35.5 % — ABNORMAL LOW (ref 39.0–52.0)
Hemoglobin: 11.5 g/dL — ABNORMAL LOW (ref 13.0–17.0)
Immature Granulocytes: 0 %
Lymphocytes Relative: 10 %
Lymphs Abs: 0.6 10*3/uL — ABNORMAL LOW (ref 0.7–4.0)
MCH: 32.6 pg (ref 26.0–34.0)
MCHC: 32.4 g/dL (ref 30.0–36.0)
MCV: 100.6 fL — ABNORMAL HIGH (ref 80.0–100.0)
Monocytes Absolute: 0.4 10*3/uL (ref 0.1–1.0)
Monocytes Relative: 6 %
Neutro Abs: 5.1 10*3/uL (ref 1.7–7.7)
Neutrophils Relative %: 81 %
Platelets: 208 10*3/uL (ref 150–400)
RBC: 3.53 MIL/uL — ABNORMAL LOW (ref 4.22–5.81)
RDW: 12.8 % (ref 11.5–15.5)
WBC: 6.3 10*3/uL (ref 4.0–10.5)
nRBC: 0 % (ref 0.0–0.2)

## 2021-03-09 LAB — TYPE AND SCREEN
ABO/RH(D): O POS
Antibody Screen: NEGATIVE

## 2021-03-09 LAB — URINALYSIS, ROUTINE W REFLEX MICROSCOPIC
Bilirubin Urine: NEGATIVE
Glucose, UA: 50 mg/dL — AB
Ketones, ur: NEGATIVE mg/dL
Leukocytes,Ua: NEGATIVE
Nitrite: NEGATIVE
Protein, ur: >=300 mg/dL — AB
Specific Gravity, Urine: 1.01 (ref 1.005–1.030)
Squamous Epithelial / HPF: NONE SEEN (ref 0–5)
pH: 5 (ref 5.0–8.0)

## 2021-03-09 LAB — COMPREHENSIVE METABOLIC PANEL
ALT: 10 U/L (ref 0–44)
AST: 8 U/L — ABNORMAL LOW (ref 15–41)
Albumin: 3.4 g/dL — ABNORMAL LOW (ref 3.5–5.0)
Alkaline Phosphatase: 57 U/L (ref 38–126)
Anion gap: 7 (ref 5–15)
BUN: 56 mg/dL — ABNORMAL HIGH (ref 8–23)
CO2: 26 mmol/L (ref 22–32)
Calcium: 8.8 mg/dL — ABNORMAL LOW (ref 8.9–10.3)
Chloride: 108 mmol/L (ref 98–111)
Creatinine, Ser: 3.9 mg/dL — ABNORMAL HIGH (ref 0.61–1.24)
GFR, Estimated: 15 mL/min — ABNORMAL LOW (ref >60)
Glucose, Bld: 84 mg/dL (ref 70–99)
Potassium: 4.3 mmol/L (ref 3.5–5.1)
Sodium: 141 mmol/L (ref 135–145)
Total Bilirubin: 0.5 mg/dL (ref 0.3–1.2)
Total Protein: 6.4 g/dL — ABNORMAL LOW (ref 6.5–8.1)

## 2021-03-09 LAB — APTT: aPTT: 35 seconds (ref 24–36)

## 2021-03-09 LAB — PROTIME-INR
INR: 1.4 — ABNORMAL HIGH (ref 0.8–1.2)
Prothrombin Time: 16.6 seconds — ABNORMAL HIGH (ref 11.4–15.2)

## 2021-03-09 LAB — SURGICAL PCR SCREEN
MRSA, PCR: NEGATIVE
Staphylococcus aureus: NEGATIVE

## 2021-03-09 NOTE — Patient Instructions (Addendum)
Your procedure is scheduled on: 03/21/21 - Tuesday ?Report to the Registration Desk on the 1st floor of the [facility]. ?To find out your arrival time, please call [phone] between 1PM - 3PM on: 03/20/21 - Monday ?[facility] on 03/17/21 at 8:30 am. ? ?REMEMBER: ?Instructions that are not followed completely may result in serious medical risk, up to and including death; or upon the discretion of your surgeon and anesthesiologist your surgery may need to be rescheduled. ? ?Do not eat food after midnight the night before surgery.  ?No gum chewing, lozengers or hard candies. ? ?You may however, drink CLEAR liquids up to 2 hours before you are scheduled to arrive for your surgery. Do not drink anything within 2 hours of your scheduled arrival time. ? ?Clear liquids include: ?- water  ?- apple juice without pulp ?- gatorade (not RED colors) ?- black coffee or tea (Do NOT add milk or creamers to the coffee or tea) ?Do NOT drink anything that is not on this list. ? ?In addition, your doctor has ordered for you to drink the provided  ?Ensure Pre-Surgery Clear Carbohydrate Drink  ?Drinking this carbohydrate drink up to two hours before surgery helps to reduce insulin resistance and improve patient outcomes. Please complete drinking 2 hours prior to scheduled arrival time. ? ?TAKE THESE MEDICATIONS THE MORNING OF SURGERY WITH A SIP OF WATER: ? ?- carvedilol (COREG) 3.125 MG tablet ? ?Follow recommendations from Cardiologist, Pulmonologist or PCP regarding stopping Aspirin, Coumadin, Plavix, Eliquis, Pradaxa, or Pletal. Stop taking apixaban (ELIQUIS) 2.5 MG TABS tablet beginning 03/17/21, may resume per MD order. ? ?One week prior to surgery: stop beginning 03/13/21, ?Stop Anti-inflammatories (NSAIDS) such as Advil, Aleve, Ibuprofen, Motrin, Naproxen, Naprosyn and Aspirin based products such as Excedrin, Goodys Powder, BC Powder. ? ?Stop ANY OVER THE COUNTER supplements until after  surgery. Stop Vitamin D3 and Multivitamin beginning 03/13/21. ? ?You may take Tylenol if needed for pain up until the day of surgery. ? ?No Alcohol for 24 hours before or after surgery. ? ?No Smoking including e-cigarettes for 24 hours prior to surgery.  ?No chewable tobacco products for at least 6 hours prior to surgery.  ?No nicotine patches on the day of surgery. ? ?Do not use any "recreational" drugs for at least a week prior to your surgery.  ?Please be advised that the combination of cocaine and anesthesia may have negative outcomes, up to and including death. ?If you test positive for cocaine, your surgery will be cancelled. ? ?On the morning of surgery brush your teeth with toothpaste and water, you may rinse your mouth with mouthwash if you wish. ?Do not swallow any toothpaste or mouthwash. ? ?Use CHG Soap or wipes as directed on instruction sheet. ? ?Do not wear jewelry, make-up, hairpins, clips or nail polish. ? ?Do not wear lotions, powders, or perfumes.  ? ?Do not shave body from the neck down 48 hours prior to surgery just in case you cut yourself which could leave a site for infection.  ?Also, freshly shaved skin may become irritated if using the CHG soap. ? ?Contact lenses, hearing aids and dentures may not be worn into surgery. ? ?Do not bring valuables to the hospital. [facility] is not responsible for any missing/lost belongings or valuables.  ? ?Notify your doctor if there is any change in your medical condition (cold, fever, infection). ? ?Wear comfortable clothing (specific to your surgery type) to the hospital. ? ?After  surgery, you can help prevent lung complications by doing breathing exercises.  ?Take deep breaths and cough every 1-2 hours. Your doctor may order a device called an Incentive Spirometer to help you take deep breaths. ?When coughing or sneezing, hold a pillow firmly against your incision with both hands. This is called ?splinting.? Doing this helps protect your incision. It  also decreases belly discomfort. ? ?If you are being admitted to the hospital overnight, leave your suitcase in the car. ?After surgery it may be brought to your room. ? ?If you are being discharged the day of surgery, you will not be allowed to drive home. ?You will need a responsible adult (18 years or older) to drive you home and stay with you that night.  ? ?If you are taking public transportation, you will need to have a responsible adult (18 years or older) with you. ?Please confirm with your physician that it is acceptable to use public transportation.  ? ?Please call the [facility] at [phone] if you have any questions about these instructions. ? ?Surgery Visitation Policy: ? ?Patients undergoing a surgery or procedure may have one family member or support person with them as long as that person is not COVID-19 positive or experiencing its symptoms.  ?That person may remain in the waiting area during the procedure and may rotate out with other people. ? ?Inpatient Visitation:   ? ?Visiting hours are 7 a.m. to 8 p.m. ?Up to two visitors ages 16+ are allowed at one time in a patient room. The visitors may rotate out with other people during the day. Visitors must check out when they leave, or other visitors will not be allowed. One designated support person may remain overnight. ?The visitor must pass COVID-19 screenings, use hand sanitizer when entering and exiting the patient?s room and wear a mask at all times, including in the patient?s room. ?Patients must also wear a mask when staff or their visitor are in the room. ?Masking is required regardless of vaccination status.  ?

## 2021-03-14 ENCOUNTER — Encounter: Payer: Self-pay | Admitting: Orthopedic Surgery

## 2021-03-14 NOTE — Progress Notes (Signed)
?Perioperative Services ? ?Pre-Admission/Anesthesia Testing Clinical Review ? ?Date: 03/14/21 ? ?Patient Demographics:  ?Name: Lawrence Perkins. ?DOB:   12-26-1940 ?MRN:   [id number] ? ?Planned Surgical Procedure(s):  ? ? Case: [id number] Date/Time: 03/21/21 0700  ? Procedure: TOTAL KNEE ARTHROPLASTY (Right: Knee)  ? Anesthesia type: Choice  ? Pre-op diagnosis: Primary osteoarthritis of right knee  M17.11  ? Location: [facility] OR ROOM 01 / [facility]S FOR ANEST[facility]ESIA [facility]  ? Surgeons: Hessie Knows, MD  ? ?NOTE: Available PAT nursing documentation and vital signs have been reviewed. Clinical nursing staff has updated patient's PMH/PSHx, current medication list, and drug allergies/intolerances to ensure comprehensive history available to assist in medical decision making as it pertains to the aforementioned surgical procedure and anticipated anesthetic course. Extensive review of available clinical information performed. [place] PMH and PSHx updated with any diagnoses/procedures that  may have been inadvertently omitted during his intake with the pre-admission testing department's nursing staff. ? ?Clinical Discussion:  ?Lawrence Perkins. is a 81 y.o. male who is submitted for pre-surgical anesthesia review and clearance prior to him undergoing the above procedure. Patient is a Former Smoker (quit 01/1981). Pertinent PMH includes: CAD, cardiomyopathy, CHF, atrial fibrillation, aortic atherosclerosis, cardiac syncope, CKD-IV, HTN, HLD, OA, BPH. ? ?Patient is followed by cardiology Nehemiah Massed, MD). He was last seen in the cardiology clinic on 12/14/2020; notes reviewed. At the time of his clinic visit, the patient denied any chest pain, shortness of breath, PND, orthopnea, palpitations, significant peripheral edema, vertiginous symptoms, or presyncope/syncope.  Patient with a PMH significant for cardiovascular diagnoses. ? ?Last TTE was performed on 05/04/2019 revealing a normal left ventricular systolic function with  moderate LVH.  LVEF >55%.  Left atrium was mildly enlarged.  There was mild aortic, mitral, and tricuspid valve regurgitation.  There was no significant transvalvular gradient to suggest stenosis. ? ?Myocardial perfusion imaging study performed on 05/05/2019 revealing normal left ventricular systolic function with an EF of 59%.  There was no evidence of stress-induced myocardial ischemia or arrhythmia.  Study determined to be normal low risk. ? ?Patient with an atrial fibrillation diagnosis; CHA2DS2-VASc Score = 5 (age x 2, CHF, HTN, aortic plaque).  Rate and rhythm maintained on oral carvedilol.  Patient is chronically anticoagulated on dose reduced apixaban (d/t age and renal function); compliant with prescribed therapy with no evidence or reports of GI bleeding.  Blood pressure reasonably controlled at 136/80 on currently prescribed beta-blocker and diuretic therapies.  Patient is on a statin for his HLD.  He is not diabetic.  Functional capacity somewhat limited by patient's arthritides, however he is still felt to be able to achieve at least 4 METS of activity without angina/anginal equivalent symptoms.  No changes were made to his medication regimen.  Patient follow-up with outpatient cardiology in 6 months or sooner if needed. ? ?Lawrence Perkins. is scheduled for an elective RIGHT TOTAL KNEE ARTHROPLASTY on 03/21/2021 with Dr. Hessie Knows, MD. Given patient's past medical history significant for cardiovascular diagnoses, presurgical cardiac clearance was sought by the PAT team. "The patient is at the lowest risk possible for perioperative cardiovascular complications with the planned procedure.  The overall risk his procedure is low (<1%).  Currently has no evidence active and/or significant angina and/or congestive heart failure. Patient may proceed to surgery without restriction or need for further cardiovascular testing and an overall LOW risk".  Again, this patient is on daily anticoagulation therapy.   He has been instructed on recommendations from  his cardiologist for holding his apixaban therapy for 3 days prior to his procedure with plans to restart as soon as postoperative bleeding risk felt to be minimized by his primary attending surgeon.  The patient is aware that his last labs of apixaban should be on 03/17/2021. ? ?Patient denies previous perioperative complications with anesthesia in the past.  Patient reporting (+) intra/postoperative exacerbations of his known atrial fibrillation. In review of the available records, it is noted that patient underwent a general anesthetic course here (ASA III) in 01/2017 without documented complications.  ? ?Vitals with BMI 03/09/2021 09/27/2020 06/01/2020  ?Height '5\' 11"'$  - '5\' 11"'$   ?Weight 196 lbs 7 oz 193 lbs 6 oz -  ?BMI 27.41 - -  ?Systolic 161 096 045  ?Diastolic 77 82 65  ?Pulse 52 75 59  ? ? ?Providers/Specialists:  ? ?NOTE: Primary physician provider listed below. Patient may have been seen by APP or partner within same practice.  ? ?PROVIDER ROLE / SPECIALTY LAST OV  ?Hessie Knows, MD Orthopedics (Surgeon) 03/13/2021  ?Kirk Ruths, MD Primary Care Provider 02/28/2021  ?Serafina Royals, MD Cardiology 12/14/2020  ?Delight Hoh, MD Hematology/Oncology 09/27/2020  ? ?Allergies:  ?Patient has no known allergies. ? ?Current Home Medications:  ? ?No current facility-administered medications for this encounter.  ? ? apixaban (ELIQUIS) 2.5 MG TABS tablet  ? carvedilol (COREG) 3.125 MG tablet  ? cholecalciferol (VITAMIN D3) 25 MCG (1000 UNIT) tablet  ? hydrocortisone cream 1 %  ? ibuprofen (ADVIL) 200 MG tablet  ? Multiple Vitamin (MULTIVITAMIN WITH MINERALS) TABS tablet  ? rosuvastatin (CRESTOR) 10 MG tablet  ? tamsulosin (FLOMAX) 0.4 MG CAPS capsule  ? torsemide (DEMADEX) 20 MG tablet  ? ?History:  ? ?Past Medical History:  ?Diagnosis Date  ? Aortic atherosclerosis ([place])   ? Arthritis   ? Atrial fibrillation and flutter ([place])   ? a.) CHA2DS2-VASc = 5 (age x  2, CHF, HTN, aortic plaque). b.) rate/rhythm maintain on oral carvedilol; chronically anticoagulated with dose reduced apixaban  ? BPH (benign prostatic hyperplasia)   ? Cardiac syncope   ? Cardiomyopathy ([place])   ? a.) TTE 04/23/2016: EF 40%. b.) TTE 09/03/2016: EF 50%. c.) TTE 05/04/2019: EF >55%.  ? CHF (congestive heart failure) ([place])   ? a.) TTE 04/23/2016: EF 40%; BAE; mild AR/TR, mod MR; G1DD. b.) TTE 09/03/2016: EF 50%; mild LVH; BAE; triv AR/PR, mod MR/TR. c.) TTE 05/04/2019: EF >55%; mod LVH, mild LA enlargement; mild AR/MR/TR  ? CKD (chronic kidney disease), stage IV ([place])   ? Complication of anesthesia   ? a.) h/o intra/postoperative A.fib  ? Coronary artery disease   ? Diverticulosis   ? Gout   ? History of 2019 novel coronavirus disease (COVID-19) 11/14/2020  ? History of colon polyps   ? Hypercholesteremia   ? Hypertension   ? Long term current use of anticoagulant   ? a.) dose reduced apixaban; dose reduction d/t to age and CKD Dx.  ? 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 a.) s/p resection  ? MGUS (monoclonal gammopathy of unknown significance)   ? Pleural effusion   ? Prostatism   ? Rectal bleeding   ? ?Past Surgical History:  ?Procedure Laterality Date  ? ANKLE SURGERY    ? following volleyball injury  ? APPENDECTOMY    ? COLONOSCOPY    ? COLONOSCOPY WITH PROPOFOL N/A 01/02/2017  ? Procedure: COLONOSCOPY WITH PROPOFOL;  Surgeon: Manya Silvas, MD;  Location: [facility] END[facility]COPY;  Service:  Endoscopy;  Laterality: N/A;  ? JOINT REPLACEMENT    ? RIGHT TOTAL HIP  ? TONSILLECTOMY    ? TOTAL HIP ARTHROPLASTY  09/2009  ? Right femoral neck fracture  ? TOTAL HIP ARTHROPLASTY Left 03/06/2016  ? Procedure: TOTAL HIP ARTHROPLASTY ANTERIOR APPROACH;  Surgeon: Hessie Knows, MD;  Location: [facility] ORS;  Service: Orthopedics;  Laterality: Left;  ? ?Family History  ?Problem Relation Age of Onset  ? Cirrhosis Mother   ? Other Father   ?     Lung Fibrosis  ? Prostate cancer Neg Hx   ? Kidney cancer Neg Hx   ? Bladder Cancer Neg Hx    ? ?Social History  ? ?Tobacco Use  ? Smoking status: Former  ?  Years: 15.00  ?  Types: Cigarettes  ?  Quit date: 01/01/1981  ?  Years since quitting: 40.2  ? Smokeless tobacco: Never  ?Vaping Use  ? Vaping [place]

## 2021-03-17 ENCOUNTER — Other Ambulatory Visit: Admission: RE | Admit: 2021-03-17 | Payer: Medicare HMO | Source: Ambulatory Visit

## 2021-03-20 NOTE — Progress Notes (Signed)
PT/PTT preOp testing is our standard of care for a patient with this medical history for this type of procedure ?

## 2021-03-21 ENCOUNTER — Other Ambulatory Visit: Payer: Self-pay

## 2021-03-21 ENCOUNTER — Ambulatory Visit: Payer: Medicare HMO | Admitting: Urgent Care

## 2021-03-21 ENCOUNTER — Observation Stay: Payer: Medicare HMO

## 2021-03-21 ENCOUNTER — Encounter: Payer: Self-pay | Admitting: Orthopedic Surgery

## 2021-03-21 ENCOUNTER — Encounter
Admission: RE | Disposition: A | Discharge status: Home Health | Payer: Self-pay | Source: Home / Self Care | Attending: Orthopedic Surgery

## 2021-03-21 ENCOUNTER — Inpatient Hospital Stay
Admission: RE | Admit: 2021-03-21 | Discharge: 2021-03-29 | DRG: 469 | Disposition: A | Discharge status: Home Health | Payer: Medicare HMO | Attending: Orthopedic Surgery | Admitting: Orthopedic Surgery

## 2021-03-21 DIAGNOSIS — N179 Acute kidney failure, unspecified: Secondary | ICD-10-CM | POA: Diagnosis not present

## 2021-03-21 DIAGNOSIS — E871 Hypo-osmolality and hyponatremia: Secondary | ICD-10-CM | POA: Diagnosis not present

## 2021-03-21 DIAGNOSIS — Z801 Family history of malignant neoplasm of trachea, bronchus and lung: Secondary | ICD-10-CM

## 2021-03-21 DIAGNOSIS — I5022 Chronic systolic (congestive) heart failure: Secondary | ICD-10-CM | POA: Diagnosis present

## 2021-03-21 DIAGNOSIS — E875 Hyperkalemia: Secondary | ICD-10-CM | POA: Diagnosis not present

## 2021-03-21 DIAGNOSIS — Z01812 Encounter for preprocedural laboratory examination: Secondary | ICD-10-CM

## 2021-03-21 DIAGNOSIS — N401 Enlarged prostate with lower urinary tract symptoms: Secondary | ICD-10-CM | POA: Diagnosis present

## 2021-03-21 DIAGNOSIS — M1711 Unilateral primary osteoarthritis, right knee: Principal | ICD-10-CM | POA: Diagnosis present

## 2021-03-21 DIAGNOSIS — I429 Cardiomyopathy, unspecified: Secondary | ICD-10-CM | POA: Diagnosis present

## 2021-03-21 DIAGNOSIS — E7800 Pure hypercholesterolemia, unspecified: Secondary | ICD-10-CM | POA: Diagnosis present

## 2021-03-21 DIAGNOSIS — I480 Paroxysmal atrial fibrillation: Secondary | ICD-10-CM | POA: Diagnosis present

## 2021-03-21 DIAGNOSIS — Z471 Aftercare following joint replacement surgery: Secondary | ICD-10-CM | POA: Diagnosis not present

## 2021-03-21 DIAGNOSIS — Z7901 Long term (current) use of anticoagulants: Secondary | ICD-10-CM

## 2021-03-21 DIAGNOSIS — Z96651 Presence of right artificial knee joint: Secondary | ICD-10-CM | POA: Diagnosis not present

## 2021-03-21 DIAGNOSIS — I4891 Unspecified atrial fibrillation: Secondary | ICD-10-CM | POA: Diagnosis not present

## 2021-03-21 DIAGNOSIS — R338 Other retention of urine: Secondary | ICD-10-CM | POA: Diagnosis present

## 2021-03-21 DIAGNOSIS — D62 Acute posthemorrhagic anemia: Secondary | ICD-10-CM | POA: Diagnosis not present

## 2021-03-21 DIAGNOSIS — Z811 Family history of alcohol abuse and dependence: Secondary | ICD-10-CM

## 2021-03-21 DIAGNOSIS — I700 Atherosclerosis of aorta: Secondary | ICD-10-CM | POA: Diagnosis present

## 2021-03-21 DIAGNOSIS — Z823 Family history of stroke: Secondary | ICD-10-CM

## 2021-03-21 DIAGNOSIS — N186 End stage renal disease: Secondary | ICD-10-CM

## 2021-03-21 DIAGNOSIS — I132 Hypertensive heart and chronic kidney disease with heart failure and with stage 5 chronic kidney disease, or end stage renal disease: Secondary | ICD-10-CM | POA: Diagnosis present

## 2021-03-21 DIAGNOSIS — I2510 Atherosclerotic heart disease of native coronary artery without angina pectoris: Secondary | ICD-10-CM | POA: Diagnosis present

## 2021-03-21 DIAGNOSIS — Z85820 Personal history of malignant melanoma of skin: Secondary | ICD-10-CM

## 2021-03-21 DIAGNOSIS — Z96643 Presence of artificial hip joint, bilateral: Secondary | ICD-10-CM | POA: Diagnosis present

## 2021-03-21 DIAGNOSIS — N2581 Secondary hyperparathyroidism of renal origin: Secondary | ICD-10-CM | POA: Diagnosis present

## 2021-03-21 DIAGNOSIS — Z87891 Personal history of nicotine dependence: Secondary | ICD-10-CM

## 2021-03-21 DIAGNOSIS — Z79899 Other long term (current) drug therapy: Secondary | ICD-10-CM

## 2021-03-21 DIAGNOSIS — D631 Anemia in chronic kidney disease: Secondary | ICD-10-CM | POA: Diagnosis present

## 2021-03-21 HISTORY — DX: Malignant melanoma of other part of trunk: C43.59

## 2021-03-21 HISTORY — PX: TOTAL KNEE ARTHROPLASTY: SHX125

## 2021-03-21 HISTORY — DX: Unspecified atrial fibrillation: I48.91

## 2021-03-21 HISTORY — DX: Chronic kidney disease, stage 4 (severe): N18.4

## 2021-03-21 HISTORY — DX: Syncope and collapse: R55

## 2021-03-21 HISTORY — DX: Atherosclerosis of aorta: I70.0

## 2021-03-21 HISTORY — DX: Long term (current) use of anticoagulants: Z79.01

## 2021-03-21 LAB — POCT I-STAT, CHEM 8
BUN: 56 mg/dL — ABNORMAL HIGH (ref 8–23)
Calcium, Ion: 1.17 mmol/L (ref 1.15–1.40)
Chloride: 107 mmol/L (ref 98–111)
Creatinine, Ser: 4.9 mg/dL — ABNORMAL HIGH (ref 0.61–1.24)
Glucose, Bld: 78 mg/dL (ref 70–99)
HCT: 32 % — ABNORMAL LOW (ref 39.0–52.0)
Hemoglobin: 10.9 g/dL — ABNORMAL LOW (ref 13.0–17.0)
Potassium: 4.1 mmol/L (ref 3.5–5.1)
Sodium: 142 mmol/L (ref 135–145)
TCO2: 24 mmol/L (ref 22–32)

## 2021-03-21 SURGERY — ARTHROPLASTY, KNEE, TOTAL
Anesthesia: Spinal | Site: Knee | Laterality: Right

## 2021-03-21 MED ORDER — METHOCARBAMOL 500 MG PO TABS
500.0000 mg | ORAL_TABLET | Freq: Four times a day (QID) | ORAL | Status: DC | PRN
  Administered 2021-03-21: 500 mg via ORAL
  Filled 2021-03-21: qty 1

## 2021-03-21 MED ORDER — BISACODYL 5 MG PO TBEC
5.0000 mg | DELAYED_RELEASE_TABLET | Freq: Every day | ORAL | Status: DC | PRN
  Administered 2021-03-23 – 2021-03-28 (×3): 5 mg via ORAL
  Filled 2021-03-21 (×3): qty 1

## 2021-03-21 MED ORDER — CARVEDILOL 3.125 MG PO TABS
3.1250 mg | ORAL_TABLET | Freq: Two times a day (BID) | ORAL | Status: DC
  Administered 2021-03-21 – 2021-03-29 (×15): 3.125 mg via ORAL
  Filled 2021-03-21 (×15): qty 1

## 2021-03-21 MED ORDER — PRONTOSAN WOUND IRRIGATION OPTIME
TOPICAL | Status: DC | PRN
  Administered 2021-03-21: 1

## 2021-03-21 MED ORDER — ONDANSETRON HCL 4 MG PO TABS
4.0000 mg | ORAL_TABLET | Freq: Four times a day (QID) | ORAL | Status: DC | PRN
  Administered 2021-03-21 – 2021-03-23 (×2): 4 mg via ORAL
  Filled 2021-03-21 (×2): qty 1

## 2021-03-21 MED ORDER — ADULT MULTIVITAMIN W/MINERALS CH
1.0000 | ORAL_TABLET | Freq: Every day | ORAL | Status: DC
  Administered 2021-03-21 – 2021-03-29 (×9): 1 via ORAL
  Filled 2021-03-21 (×9): qty 1

## 2021-03-21 MED ORDER — SODIUM CHLORIDE 0.9 % IV SOLN: INTRAVENOUS | Status: DC

## 2021-03-21 MED ORDER — SODIUM CHLORIDE 0.9 % IR SOLN: Status: DC | PRN

## 2021-03-21 MED ORDER — TRAMADOL HCL 50 MG PO TABS
50.0000 mg | ORAL_TABLET | Freq: Four times a day (QID) | ORAL | Status: DC
  Administered 2021-03-21 – 2021-03-29 (×24): 50 mg via ORAL
  Filled 2021-03-21 (×26): qty 1

## 2021-03-21 MED ORDER — CHLORHEXIDINE GLUCONATE 0.12 % MT SOLN
OROMUCOSAL | Status: AC
  Administered 2021-03-21: 15 mL via OROMUCOSAL
  Filled 2021-03-21: qty 15

## 2021-03-21 MED ORDER — ACETAMINOPHEN 325 MG PO TABS: 325.0000 mg | ORAL_TABLET | Freq: Four times a day (QID) | ORAL | Status: DC | PRN

## 2021-03-21 MED ORDER — OXYCODONE HCL 5 MG PO TABS: 5.0000 mg | ORAL_TABLET | Freq: Once | ORAL | Status: DC | PRN

## 2021-03-21 MED ORDER — PANTOPRAZOLE SODIUM 40 MG PO TBEC
40.0000 mg | DELAYED_RELEASE_TABLET | Freq: Every day | ORAL | Status: DC
  Administered 2021-03-21 – 2021-03-29 (×9): 40 mg via ORAL
  Filled 2021-03-21 (×9): qty 1

## 2021-03-21 MED ORDER — PROPOFOL 500 MG/50ML IV EMUL
INTRAVENOUS | Status: DC | PRN
  Administered 2021-03-21: 40 ug/kg/min via INTRAVENOUS

## 2021-03-21 MED ORDER — MIDAZOLAM HCL 2 MG/2ML IJ SOLN
INTRAMUSCULAR | Status: AC
  Filled 2021-03-21: qty 2

## 2021-03-21 MED ORDER — ONDANSETRON HCL 4 MG/2ML IJ SOLN: 4.0000 mg | Freq: Once | INTRAMUSCULAR | Status: DC | PRN

## 2021-03-21 MED ORDER — NEOMYCIN-POLYMYXIN B GU 40-200000 IR SOLN
Status: AC
  Filled 2021-03-21: qty 40

## 2021-03-21 MED ORDER — PHENOL 1.4 % MT LIQD
1.0000 | OROMUCOSAL | Status: DC | PRN
  Filled 2021-03-21: qty 177

## 2021-03-21 MED ORDER — FAMOTIDINE 20 MG PO TABS
ORAL_TABLET | ORAL | Status: AC
  Administered 2021-03-21: 20 mg via ORAL
  Filled 2021-03-21: qty 1

## 2021-03-21 MED ORDER — APIXABAN 2.5 MG PO TABS
2.5000 mg | ORAL_TABLET | Freq: Two times a day (BID) | ORAL | Status: DC
Start: 2021-03-22 — End: 2021-03-29
  Administered 2021-03-22 – 2021-03-29 (×15): 2.5 mg via ORAL
  Filled 2021-03-21 (×15): qty 1

## 2021-03-21 MED ORDER — FLEET ENEMA 7-19 GM/118ML RE ENEM: 1.0000 | ENEMA | Freq: Once | RECTAL | Status: DC | PRN

## 2021-03-21 MED ORDER — HYDROCORTISONE 1 % EX CREA
1.0000 "application " | TOPICAL_CREAM | Freq: Two times a day (BID) | CUTANEOUS | Status: DC | PRN
  Filled 2021-03-21: qty 28

## 2021-03-21 MED ORDER — DIPHENHYDRAMINE HCL 12.5 MG/5ML PO ELIX: 12.5000 mg | ORAL_SOLUTION | ORAL | Status: DC | PRN

## 2021-03-21 MED ORDER — CEFAZOLIN SODIUM-DEXTROSE 2-4 GM/100ML-% IV SOLN
2.0000 g | Freq: Four times a day (QID) | INTRAVENOUS | Status: AC
  Administered 2021-03-21 (×2): 2 g via INTRAVENOUS
  Filled 2021-03-21 (×2): qty 100

## 2021-03-21 MED ORDER — MIDAZOLAM HCL 5 MG/5ML IJ SOLN
INTRAMUSCULAR | Status: DC | PRN
  Administered 2021-03-21: 2 mg via INTRAVENOUS

## 2021-03-21 MED ORDER — ORAL CARE MOUTH RINSE: 15.0000 mL | Freq: Once | OROMUCOSAL | Status: AC

## 2021-03-21 MED ORDER — SODIUM CHLORIDE FLUSH 0.9 % IV SOLN
INTRAVENOUS | Status: AC
  Filled 2021-03-21: qty 80

## 2021-03-21 MED ORDER — BUPIVACAINE HCL (PF) 0.5 % IJ SOLN
INTRAMUSCULAR | Status: DC | PRN
  Administered 2021-03-21: 2.5 mL

## 2021-03-21 MED ORDER — TORSEMIDE 20 MG PO TABS
40.0000 mg | ORAL_TABLET | Freq: Every day | ORAL | Status: DC
Start: 2021-03-21 — End: 2021-03-22
  Administered 2021-03-21 – 2021-03-22 (×2): 40 mg via ORAL
  Filled 2021-03-21 (×2): qty 2

## 2021-03-21 MED ORDER — POLYETHYLENE GLYCOL 3350 17 G PO PACK
17.0000 g | PACK | Freq: Every day | ORAL | Status: DC | PRN
  Administered 2021-03-26 – 2021-03-28 (×2): 17 g via ORAL
  Filled 2021-03-21 (×2): qty 1

## 2021-03-21 MED ORDER — PROPOFOL 1000 MG/100ML IV EMUL
INTRAVENOUS | Status: AC
  Filled 2021-03-21: qty 100

## 2021-03-21 MED ORDER — CEFAZOLIN SODIUM-DEXTROSE 2-4 GM/100ML-% IV SOLN
2.0000 g | INTRAVENOUS | Status: AC
  Administered 2021-03-21: 2 g via INTRAVENOUS

## 2021-03-21 MED ORDER — MORPHINE SULFATE (PF) 2 MG/ML IV SOLN
0.5000 mg | INTRAVENOUS | Status: DC | PRN
  Administered 2021-03-21: 1 mg via INTRAVENOUS
  Filled 2021-03-21: qty 1

## 2021-03-21 MED ORDER — HYDROCODONE-ACETAMINOPHEN 5-325 MG PO TABS
1.0000 | ORAL_TABLET | ORAL | Status: DC | PRN
  Administered 2021-03-23: 1 via ORAL
  Filled 2021-03-21: qty 1

## 2021-03-21 MED ORDER — BUPIVACAINE LIPOSOME 1.3 % IJ SUSP
INTRAMUSCULAR | Status: AC
  Filled 2021-03-21: qty 40

## 2021-03-21 MED ORDER — METOCLOPRAMIDE HCL 5 MG/ML IJ SOLN: 5.0000 mg | Freq: Three times a day (TID) | INTRAMUSCULAR | Status: DC | PRN

## 2021-03-21 MED ORDER — METHOCARBAMOL 1000 MG/10ML IJ SOLN
500.0000 mg | Freq: Four times a day (QID) | INTRAVENOUS | Status: DC | PRN
  Filled 2021-03-21: qty 5

## 2021-03-21 MED ORDER — MENTHOL 3 MG MT LOZG
1.0000 | LOZENGE | OROMUCOSAL | Status: DC | PRN
  Filled 2021-03-21: qty 9

## 2021-03-21 MED ORDER — BUPIVACAINE HCL (PF) 0.5 % IJ SOLN
INTRAMUSCULAR | Status: AC
  Filled 2021-03-21: qty 10

## 2021-03-21 MED ORDER — FENTANYL CITRATE (PF) 100 MCG/2ML IJ SOLN: 25.0000 ug | INTRAMUSCULAR | Status: DC | PRN

## 2021-03-21 MED ORDER — ZOLPIDEM TARTRATE 5 MG PO TABS
5.0000 mg | ORAL_TABLET | Freq: Every evening | ORAL | Status: DC | PRN
  Filled 2021-03-21: qty 1

## 2021-03-21 MED ORDER — SODIUM CHLORIDE (PF) 0.9 % IJ SOLN
INTRAMUSCULAR | Status: DC | PRN
  Administered 2021-03-21: 91 mL via INTRAMUSCULAR

## 2021-03-21 MED ORDER — ROSUVASTATIN CALCIUM 10 MG PO TABS
10.0000 mg | ORAL_TABLET | Freq: Every day | ORAL | Status: DC
  Administered 2021-03-21 – 2021-03-28 (×8): 10 mg via ORAL
  Filled 2021-03-21 (×8): qty 1

## 2021-03-21 MED ORDER — MORPHINE SULFATE (PF) 10 MG/ML IV SOLN
INTRAVENOUS | Status: AC
  Filled 2021-03-21: qty 1

## 2021-03-21 MED ORDER — METOCLOPRAMIDE HCL 10 MG PO TABS: 5.0000 mg | ORAL_TABLET | Freq: Three times a day (TID) | ORAL | Status: DC | PRN

## 2021-03-21 MED ORDER — FAMOTIDINE 20 MG PO TABS: 20.0000 mg | ORAL_TABLET | Freq: Once | ORAL | Status: AC

## 2021-03-21 MED ORDER — DOCUSATE SODIUM 100 MG PO CAPS
100.0000 mg | ORAL_CAPSULE | Freq: Two times a day (BID) | ORAL | Status: DC
  Administered 2021-03-21 – 2021-03-29 (×17): 100 mg via ORAL
  Filled 2021-03-21 (×17): qty 1

## 2021-03-21 MED ORDER — ONDANSETRON HCL 4 MG/2ML IJ SOLN
4.0000 mg | Freq: Four times a day (QID) | INTRAMUSCULAR | Status: DC | PRN
  Administered 2021-03-21 – 2021-03-22 (×3): 4 mg via INTRAVENOUS
  Filled 2021-03-21 (×4): qty 2

## 2021-03-21 MED ORDER — VITAMIN D 25 MCG (1000 UNIT) PO TABS
1000.0000 [IU] | ORAL_TABLET | Freq: Every day | ORAL | Status: DC
  Administered 2021-03-21 – 2021-03-29 (×9): 1000 [IU] via ORAL
  Filled 2021-03-21 (×9): qty 1

## 2021-03-21 MED ORDER — EPHEDRINE SULFATE (PRESSORS) 50 MG/ML IJ SOLN
INTRAMUSCULAR | Status: DC | PRN
  Administered 2021-03-21 (×4): 5 mg via INTRAVENOUS

## 2021-03-21 MED ORDER — OXYCODONE HCL 5 MG/5ML PO SOLN: 5.0000 mg | Freq: Once | ORAL | Status: DC | PRN

## 2021-03-21 MED ORDER — BUPIVACAINE-EPINEPHRINE (PF) 0.25% -1:200000 IJ SOLN
INTRAMUSCULAR | Status: AC
  Filled 2021-03-21: qty 60

## 2021-03-21 MED ORDER — TAMSULOSIN HCL 0.4 MG PO CAPS
0.4000 mg | ORAL_CAPSULE | Freq: Every day | ORAL | Status: DC
  Administered 2021-03-21 – 2021-03-29 (×9): 0.4 mg via ORAL
  Filled 2021-03-21 (×9): qty 1

## 2021-03-21 MED ORDER — SCOPOLAMINE 1 MG/3DAYS TD PT72
1.0000 | MEDICATED_PATCH | TRANSDERMAL | Status: DC
  Administered 2021-03-21 – 2021-03-27 (×3): 1.5 mg via TRANSDERMAL
  Filled 2021-03-21 (×4): qty 1

## 2021-03-21 MED ORDER — ALUM & MAG HYDROXIDE-SIMETH 200-200-20 MG/5ML PO SUSP: 30.0000 mL | ORAL | Status: DC | PRN

## 2021-03-21 MED ORDER — CEFAZOLIN SODIUM-DEXTROSE 2-4 GM/100ML-% IV SOLN
INTRAVENOUS | Status: AC
  Filled 2021-03-21: qty 100

## 2021-03-21 MED ORDER — ACETAMINOPHEN 10 MG/ML IV SOLN: 1000.0000 mg | Freq: Once | INTRAVENOUS | Status: DC | PRN

## 2021-03-21 MED ORDER — CHLORHEXIDINE GLUCONATE 0.12 % MT SOLN: 15.0000 mL | Freq: Once | OROMUCOSAL | Status: AC

## 2021-03-21 MED ORDER — HYDROCODONE-ACETAMINOPHEN 7.5-325 MG PO TABS
1.0000 | ORAL_TABLET | ORAL | Status: DC | PRN
  Administered 2021-03-21 – 2021-03-24 (×3): 1 via ORAL
  Filled 2021-03-21 (×3): qty 1
  Filled 2021-03-21: qty 2
  Filled 2021-03-21: qty 1

## 2021-03-21 SURGICAL SUPPLY — 71 items
BLADE SAGITTAL 25.0X1.19X90 (BLADE) ×2 IMPLANT
BLADE SAW 90X13X1.19 OSCILLAT (BLADE) ×2 IMPLANT
BLOCK CUTTING FEMUR 6 RT (MISCELLANEOUS) ×1 IMPLANT
BLOCK CUTTING TIBIAL 4 RT MIS (MISCELLANEOUS) ×1 IMPLANT
BLOCK CUTTING TIBIAL 5 RT (MISCELLANEOUS) ×1 IMPLANT
BNDG ELASTIC 6X5.8 VLCR STR LF (GAUZE/BANDAGES/DRESSINGS) ×2 IMPLANT
CANISTER WOUND CARE 500ML ATS (WOUND CARE) ×2 IMPLANT
CEMENT HV SMART SET (Cement) ×4 IMPLANT
CHLORAPREP W/TINT 26 (MISCELLANEOUS) ×4 IMPLANT
COOLER POLAR GLACIER W/PUMP (MISCELLANEOUS) ×2 IMPLANT
CUFF TOURN SGL QUICK 24 (TOURNIQUET CUFF)
CUFF TOURN SGL QUICK 34 (TOURNIQUET CUFF)
CUFF TRNQT CYL 24X4X16.5-23 (TOURNIQUET CUFF) IMPLANT
CUFF TRNQT CYL 34X4.125X (TOURNIQUET CUFF) IMPLANT
DRAPE 3/4 80X56 (DRAPES) ×4 IMPLANT
DRSG MEPILEX SACRM 8.7X9.8 (GAUZE/BANDAGES/DRESSINGS) ×2 IMPLANT
ELECT CAUTERY BLADE 6.4 (BLADE) ×2 IMPLANT
ELECT REM PT RETURN 9FT ADLT (ELECTROSURGICAL) ×2
ELECTRODE REM PT RTRN 9FT ADLT (ELECTROSURGICAL) ×1 IMPLANT
FEMORAL COMP CEMENTED SZ6 RT (Femur) ×1 IMPLANT
FEMUR BONE MODEL (MISCELLANEOUS) ×1 IMPLANT
GAUZE SPONGE 4X4 12PLY STRL (GAUZE/BANDAGES/DRESSINGS) ×1 IMPLANT
GAUZE XEROFORM 1X8 LF (GAUZE/BANDAGES/DRESSINGS) ×1 IMPLANT
GLOVE SURG ORTHO LTX SZ8 (GLOVE) ×2 IMPLANT
GLOVE SURG SYN 9.0  PF PI (GLOVE) ×1
GLOVE SURG SYN 9.0 PF PI (GLOVE) ×1 IMPLANT
GLOVE SURG UNDER LTX SZ8 (GLOVE) ×2 IMPLANT
GLOVE SURG UNDER POLY LF SZ9 (GLOVE) ×2 IMPLANT
GOWN SRG 2XL LVL 4 RGLN SLV (GOWNS) ×1 IMPLANT
GOWN STRL NON-REIN 2XL LVL4 (GOWNS) ×1
GOWN STRL REUS W/ TWL LRG LVL3 (GOWN DISPOSABLE) ×1 IMPLANT
GOWN STRL REUS W/ TWL XL LVL3 (GOWN DISPOSABLE) ×1 IMPLANT
GOWN STRL REUS W/TWL LRG LVL3 (GOWN DISPOSABLE) ×1
GOWN STRL REUS W/TWL XL LVL3 (GOWN DISPOSABLE) ×1
HOLDER FOLEY CATH W/STRAP (MISCELLANEOUS) ×2 IMPLANT
INSERT TIBIAL FIXED SZ5 RT 11M (Insert) ×1 IMPLANT
IV NS IRRIG 3000ML ARTHROMATIC (IV SOLUTION) ×2 IMPLANT
KIT PREVENA INCISION MGT20CM45 (CANNISTER) ×2 IMPLANT
KIT TURNOVER KIT A (KITS) ×2 IMPLANT
MANIFOLD NEPTUNE II (INSTRUMENTS) ×3 IMPLANT
NDL SAFETY ECLIPSE 18X1.5 (NEEDLE) ×1 IMPLANT
NDL SPNL 18GX3.5 QUINCKE PK (NEEDLE) ×1 IMPLANT
NDL SPNL 20GX3.5 QUINCKE YW (NEEDLE) ×1 IMPLANT
NEEDLE HYPO 18GX1.5 SHARP (NEEDLE) ×2
NEEDLE SPNL 18GX3.5 QUINCKE PK (NEEDLE) IMPLANT
NEEDLE SPNL 20GX3.5 QUINCKE YW (NEEDLE) ×2 IMPLANT
NS IRRIG 1000ML POUR BTL (IV SOLUTION) ×2 IMPLANT
PACK TOTAL KNEE (MISCELLANEOUS) ×2 IMPLANT
PAD WRAPON POLAR KNEE (MISCELLANEOUS) ×1 IMPLANT
PATELLA RESURFACING MEDACTA SZ (Bone Implant) ×1 IMPLANT
PENCIL SMOKE EVACUATOR COATED (MISCELLANEOUS) ×1 IMPLANT
PULSAVAC PLUS IRRIG FAN TIP (DISPOSABLE) ×2
SCALPEL PROTECTED #10 DISP (BLADE) ×4 IMPLANT
SOLUTION PRONTOSAN WOUND 350ML (IRRIGATION / IRRIGATOR) ×2 IMPLANT
STAPLER SKIN PROX 35W (STAPLE) ×2 IMPLANT
STEM EXTENSION 11MMX30MM (Stem) ×1 IMPLANT
SUCTION FRAZIER HANDLE 10FR (MISCELLANEOUS) ×1
SUCTION TUBE FRAZIER 10FR DISP (MISCELLANEOUS) ×1 IMPLANT
SUT DVC 2 QUILL PDO  T11 36X36 (SUTURE) ×1
SUT DVC 2 QUILL PDO T11 36X36 (SUTURE) ×1 IMPLANT
SUT ETHIBOND 2 V 37 (SUTURE) IMPLANT
SUT V-LOC 90 ABS DVC 3-0 CL (SUTURE) ×2 IMPLANT
SYR 20ML LL LF (SYRINGE) ×2 IMPLANT
SYR 50ML LL SCALE MARK (SYRINGE) ×4 IMPLANT
TIBIAL TRAY FIXED (Bone Implant) ×1 IMPLANT
TIP FAN IRRIG PULSAVAC PLUS (DISPOSABLE) ×1 IMPLANT
TOWEL OR 17X26 4PK STRL BLUE (TOWEL DISPOSABLE) ×1 IMPLANT
TOWER CARTRIDGE SMART MIX (DISPOSABLE) ×2 IMPLANT
TRAY FOLEY MTR SLVR 16FR STAT (SET/KITS/TRAYS/PACK) ×2 IMPLANT
WATER STERILE IRR 1000ML POUR (IV SOLUTION) ×2 IMPLANT
WRAPON POLAR PAD KNEE (MISCELLANEOUS) ×2

## 2021-03-21 NOTE — Anesthesia Preprocedure Evaluation (Addendum)
Anesthesia Evaluation  ?Patient identified by MRN, date of birth, ID band ?Patient awake ? ?General Assessment Comment: ? ?Last eliquis 5 days ago. Hx in and out of Afib perioperatively. Has CKD with increased creatinine recently (4.9) ? ?Reviewed: ?Allergy & Precautions, NPO status , Patient's Chart, lab work & pertinent test results ? ?History of Anesthesia Complications ?Negative for: history of anesthetic complications ? ?Airway ?Mallampati: II ? ?TM Distance: >3 FB ?Neck ROM: Full ? ? ? Dental ?no notable dental hx. ?(+) Teeth Intact ?  ?Pulmonary ?neg sleep apnea, neg COPD, Patient abstained from smoking.Not current smoker, former smoker,  ?  ?breath sounds clear to auscultation ? ? ? ? ? ? Cardiovascular ?Exercise Tolerance: Good ?METShypertension, + CAD and +CHF  ?(-) Past MI (-) dysrhythmias  ?Rhythm:Regular Rate:Normal ?- Systolic murmurs ?MYOCARDIAL PERFUSION IMAGING STUDY (LEXISCAN) performed on 05/05/2019 ?1. LVEF 59% ?2. Normal myocardial thickening and wall motion ?3. No artifact ?4. Left ventricular cavity size normal ?5. No evidence of stress-induced myocardial ischemia or arrhythmia ?? ?TRANSTHORACIC ECHOCARDIOGRAM performed on 05/04/2019 ?1. LVEF 55% ?2. Normal left ventricular systolic function with moderate LVH ?3. Normal right ventricular systolic function ?4. Mild AR, MR, TR ?5. No PR ?6. No evidence of valvular stenosis ?7. No pericardial effusion ?? ?  ?Neuro/Psych ?negative neurological ROS ? negative psych ROS  ? GI/Hepatic ?neg GERD  ,(+)  ?  ? (-) substance abuse ? ,   ?Endo/Other  ?neg diabetes ? Renal/GU ?CRFRenal disease  ? ?  ?Musculoskeletal ? ?(+) Arthritis ,  ? Abdominal ?  ?Peds ? Hematology ?  ?Anesthesia Other Findings ?Past Medical History: ?No date: Aortic atherosclerosis ([place]) ?No date: Arthritis ?No date: Atrial fibrillation and flutter ([place]) ?    Comment:  a.) CHA2DS2-VASc = 5 (age x 2, CHF, HTN, aortic plaque). ?             b.)  rate/rhythm maintain on oral carvedilol; chronically  ?             anticoagulated with dose reduced apixaban ?No date: BPH (benign prostatic hyperplasia) ?No date: Cardiac syncope ?No date: Cardiomyopathy [facility]) ?    Comment:  a.) TTE 04/23/2016: EF 40%. b.) TTE 09/03/2016: EF 50%.  ?             c.) TTE 05/04/2019: EF >55%. ?No date: CHF (congestive heart failure) ([place]) ?    Comment:  a.) TTE 04/23/2016: EF 40%; BAE; mild AR/TR, mod MR;  ?             G1DD. b.) TTE 09/03/2016: EF 50%; mild LVH; BAE; triv  ?             AR/PR, mod MR/TR. c.) TTE 05/04/2019: EF >55%; mod LVH,  ?             mild LA enlargement; mild AR/MR/TR ?No date: CKD (chronic kidney disease), stage IV ([place]) ?No date: Complication of anesthesia ?    Comment:  a.) h/o intra/postoperative A.fib ?No date: Coronary artery disease ?No date: Diverticulosis ?No date: Gout ?11/14/2020: History of 2019 novel coronavirus disease (COVID-19) ?No date: History of colon polyps ?No date: Hypercholesteremia ?No date: Hypertension ?No date: Long term current use of anticoagulant ?    Comment:  a.) dose reduced apixaban; dose reduction d/t to age and ?             CKD Dx. 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    Comment:  a.) s/p resection ?No  date: MGUS (monoclonal gammopathy of unknown significance) ?No date: Pleural effusion ?No date: Prostatism ?No date: Rectal bleeding ? Reproductive/Obstetrics ? ?  ? ? ? ? ? ? ? ? ? ? ? ? ? ?  ?  ? ? ? ? ? ? ?                                  Anesthesia Evaluation  ?Patient identified by MRN, date of birth, ID band ?Patient awake ? ? ? ?Reviewed: ?Allergy & Precautions, H&P , NPO status , Patient's Chart, lab work & pertinent test results, reviewed documented beta blocker date and time  ? ?History of Anesthesia Complications ?(+) history of anesthetic complications ? ?Airway ?Mallampati: II ? ? ?Neck ROM: full ? ? ? Dental ? ?(+) Teeth Intact ?  ?Pulmonary ?neg pulmonary ROS, former smoker,  ?  ?Pulmonary exam  normal ? ? ? ? ? ? ? Cardiovascular ?Exercise Tolerance: Good ?hypertension, +CHF  ?(-) Orthopnea and (-) PND negative cardio ROS ?Normal cardiovascular exam+ dysrhythmias  ?Rhythm:regular Rate:Normal ? ? ?  ?Neuro/Psych ?negative neurological ROS ? negative psych ROS  ? GI/Hepatic ?negative GI ROS, Neg liver ROS,   ?Endo/Other  ?negative endocrine ROS ? Renal/GU ?Renal diseasenegative Renal ROS  ?negative genitourinary ?  ?Musculoskeletal ? ? Abdominal ?  ?Peds ? Hematology ?negative hematology ROS ?(+)   ?Anesthesia Other Findings ?Past Medical History: ?No date: BPH (benign prostatic hyperplasia) ?No date: Cardiomyopathy [facility]) ?No date: CHF (congestive heart failure) ([place]) ?No date: Chronic kidney disease ?No date: Complication of anesthesia ?    Comment:  has converted to afib during and/or after surgery in the ?             past ?No date: Diverticulosis ?No date: Dysrhythmia ?    Comment:  afib ?No date: Gout ?No date: History of colon polyps ?No date: Hypercholesteremia ?No date: Hypertension ?No date: Melanoma ([place]) ?    Comment:  status post resection from his back ?No date: MGUS (monoclonal gammopathy of unknown significance) ?No date: Pleural effusion ?No date: Prostatism ?No date: Rectal bleeding ?Past Surgical History: ?No date: ANKLE SURGERY ?    Comment:  following volleyball injury ?No date: APPENDECTOMY ?No date: COLONOSCOPY ?No date: JOINT REPLACEMENT ?    Comment:  RIGHT TOTAL HIP ?No date: TONSILLECTOMY ?09/2009: TOTAL HIP ARTHROPLASTY ?    Comment:  Right femoral neck fracture ?03/06/2016: TOTAL HIP ARTHROPLASTY; Left ?    Comment:  Procedure: TOTAL HIP ARTHROPLASTY ANTERIOR APPROACH;   ?             Surgeon: Hessie Knows, MD;  Location: [facility] ORS;  Service: ?             Orthopedics;  Laterality: Left; ?BMI   ? Body Mass Index:  26.78 kg/m?  ?  ? Reproductive/Obstetrics ?negative OB ROS ? ?  ? ? ? ? ? ? ? ? ? ? ? ? ? ?  ?  ? ? ? ? ? ? ? ? ?Anesthesia Physical ?Anesthesia Plan ? ?ASA:  III ? ?Anesthesia Plan: General  ? ?Post-op Pain Management:   ? ?Induction:  ? ?PONV Risk Score and Plan:  ? ?Airway Management Planned:  ? ?Additional Equipment:  ? ?Intra-op Plan:  ? ?Post-operative Plan:  ? ?Informed Consent: I have reviewed the patients History and Physical, chart, labs and discussed the procedure including the risks, benefits and alternatives for  the proposed anesthesia with the patient or authorized representative who has indicated his/her understanding and acceptance.  ? ?Dental Advisory Given ? ?Plan Discussed with: CRNA ? ?Anesthesia Plan Comments:   ? ? ? ? ? ? ?Anesthesia Quick Evaluation ? ?Anesthesia Physical ?Anesthesia Plan ? ?ASA: 3 ? ?Anesthesia Plan: Spinal  ? ?Post-op Pain Management: Ofirmev IV (intra-op)*  ? ?Induction: Intravenous ? ?PONV Risk Score and Plan: 1 and Ondansetron, Dexamethasone, Propofol infusion, TIVA and Midazolam ? ?Airway Management Planned: Natural Airway ? ?Additional Equipment: None ? ?Intra-op Plan:  ? ?Post-operative Plan:  ? ?Informed Consent: I have reviewed the patients History and Physical, chart, labs and discussed the procedure including the risks, benefits and alternatives for the proposed anesthesia with the patient or authorized representative who has indicated his/her understanding and acceptance.  ? ? ? ? ? ?Plan Discussed with: CRNA and Surgeon ? ?Anesthesia Plan Comments: (Discussed R/B/A of neuraxial anesthesia technique with patient: ?- rare risks of spinal/epidural hematoma, nerve damage, infection ?- Risk of PDPH ?- Risk of nausea and vomiting ?- Risk of conversion to general anesthesia and its associated risks, including sore throat, damage to lips/eyes/teeth/oropharynx, and rare risks such as cardiac and respiratory events. ?- Risk of allergic reactions ? ?Discussed the role of CRNA in patient's perioperative care. ? ?Patient voiced understanding.)  ? ? ? ? ? ? ?Anesthesia Quick Evaluation ? ?

## 2021-03-21 NOTE — Transfer of Care (Signed)
Immediate Anesthesia Transfer of Care Note ? ?Patient: Lawrence Perkins. ? ?Procedure(s) Performed: TOTAL KNEE ARTHROPLASTY (Right: Knee) ? ?Patient Location: PACU ? ?Anesthesia Type:Spinal ? ?Level of Consciousness: awake, alert  and oriented ? ?Airway & Oxygen Therapy: Patient Spontanous Breathing and Patient connected to face mask oxygen ? ?Post-op Assessment: Report given to RN and Post -op Vital signs reviewed and stable ? ?Post vital signs: Reviewed and stable ? ?Last Vitals:  ?Vitals Value Taken Time  ?BP 106/67 03/21/21 0917  ?Temp    ?Pulse 68 03/21/21 0922  ?Resp 18 03/21/21 0922  ?SpO2 100 % 03/21/21 0922  ?Vitals shown include unvalidated device data. ? ?Last Pain:  ?Vitals:  ? 03/21/21 0616  ?TempSrc: Temporal  ?PainSc: 0-No pain  ?   ? ?  ? ?Complications: No notable events documented. ?

## 2021-03-21 NOTE — H&P (Signed)
? ?Chief Complaint  ?Patient presents with  ? Pre-op Exam  ?Scheduled for TKA 03/21/21 with Dr. Rudene Christians  ? ? ?History of the Present Illness: ?Lawrence Perkins. is a 81 y.o. male here today for history and physical for right total knee arthroplasty with Dr. Hessie Knows on 03/21/2021. Patient has had years of right knee pain. He has x-rays from September 2022 showing severe tricompartmental osteoarthritis with varus deformity with near complete loss of joint space in the medial compartment and severe subchondral cyst formation. Patient has underwent cortisone injections within the right knee with very little short-term relief. His pain is constant, severe increased with activity. His knee will swell, buckle and give way. Patient is seen Dr. Rudene Christians, discussed total knee arthroplasty and agreed and consented to the procedure. He denies any history of blood clots. He is not diabetic. He has atrial fibrillation and is on Eliquis. Dr. Nehemiah Massed, his cardiologist has said he is low risk for surgery. ? ?The patient takes Eliquis. ? ?The patient is going to [facility] to see his brother in 01/2021. ? ?I have reviewed past medical, surgical, social and family history, and allergies as documented in the EMR. ? ?Past Medical History: ?Past Medical History:  ?Diagnosis Date  ? A-fib (CMS-HCC)  ? Atherosclerosis of abdominal aorta (CMS-HCC) 09/23/2017  ? Atrial fibrillation (CMS-HCC)  ? Atrial fibrillation (CMS-HCC)  ?intermittent with hip fx and GI bleed only at this point  ? BPH (benign prostatic hypertrophy)  ?Followed by [name]  ? Cardiac syncope 03/24/2019  ? Cardiomyopathy, secondary (CMS-HCC)  ? CHF (congestive heart failure) (CMS-HCC) 2018  ? Chronic a-fib (CMS-HCC)  ? Chronic kidney disease  ?Chronic Kidney disease, stage 4  ? Colon polyp 2012  ?adenomatous with high grade dysplasia  ? Coronary artery disease involving native coronary artery of native heart 09/23/2017  ? COVID-19 11/14/2020  ? Essential hypertension, benign   ? Hx of adenomatous polyp of colon 10/01/2016  ? Hx of colonic polyps  ?multiple including dysplasia  ? Hyperplastic polyp of intestine  ?Hx of 8 including 1 transverse colon polyps x3 suggestive of adenomatous change and low grade dysplasia. There is colonic mucosa with prominent lymphoid agragate, deeper sections examined and neg for dysplasia there.  ? Paroxysmal A-fib (CMS-HCC)  ? Pleural effusion  ? Pleural effusion, bilateral  ? Pure hypercholesterolemia  ? ?Past Surgical History: ?Past Surgical History:  ?Procedure Laterality Date  ? COLONOSCOPY 01/30/2010  ?Adenomatous Polyp w/High Grade Dysplasia  ? COLONOSCOPY 09/01/2010  ?Adenomatous Polyps  ? COLONOSCOPY 09/28/2013  ?Adenomatous Polyps: CBF 09/2016; Recall Ltr mailed 08/21/2016 (dw)  ? Removal of deep hardware and right total hip replacement Right 02/18/14  ? Total hip arthroplasty anterior approach Left 03/06/2016  ?Dr.Roni Scow  ? COLONOSCOPY 01/02/2017  ?Adenomatous Polyps: CBF 01/2020  ? APPENDECTOMY  ? Hip Pinning  ?After non displaced traumatic fx 2011  ? TONSILLECTOMY  ? ?Past Family History: ?Family History  ?Problem Relation Age of Onset  ? Cirrhosis Mother  ? Alcohol abuse Mother  ? Liver disease Mother  ? Pulmonary fibrosis Father  ? Lung cancer Father  ? Stroke Brother  ? ?Medications: ?Current Outpatient Medications Ordered in Epic  ?Medication Sig Dispense Refill  ? apixaban (ELIQUIS) 2.5 mg tablet Take 1 tablet (2.5 mg total) by mouth 2 (two) times daily 180 tablet 4  ? calcium carbonate-vitamin D3 (CALTRATE 600+D) 600 mg(1,'500mg'$ ) -200 unit tablet Take 1 tablet by mouth 2 (two) times daily with meals  ? carvediloL (COREG)  3.125 MG tablet TAKE 1 TABLET TWICE DAILY WITH MEALS 180 tablet 1  ? cholecalciferol (VITAMIN D3) 2,000 unit capsule Take 2,000 Units by mouth once daily.  ? multivit-min-folic-vit K-lycop (ONE-A-DAY MEN'S 50 PLUS) 400-20-370 mcg Tab Take 1 tablet by mouth once daily  ? rosuvastatin (CRESTOR) 10 MG tablet TAKE 1 TABLET ONCE  DAILY 90 tablet 3  ? tamsulosin (FLOMAX) 0.4 mg capsule Take 0.4 mg by mouth once daily Take 30 minutes after same meal each day.  ? TORsemide (DEMADEX) 20 MG tablet TAKE 3 TABLETS ('60MG'$  TOTAL)ONCE DAILY (Patient taking differently: 20 mg 2 pills daily=40 mg) 270 tablet 1  ? ?No current Epic-ordered facility-administered medications on file.  ? ?Allergies: ?No Known Allergies  ? ?Body mass index is 28.36 kg/m?. ? ?Review of Systems: ?A comprehensive 14 point ROS was performed, reviewed, and the pertinent orthopaedic findings are documented in the HPI. ? ?Vitals:  ?03/13/21 0848  ?BP: 120/64  ? ? ?General Physical Examination:  ?General:  ?Well developed, well nourished, no apparent distress, normal affect, antalgic gait with no assistive devices. ? ?HEENT: ?Head normocephalic, atraumatic, PERRL.  ? ?Abdomen: ?Soft, non tender, non distended, Bowel sounds present. ? ?Heart: ?Examination of the heart reveals regular, rate, and rhythm. There is no murmur noted on ascultation. There is a normal apical pulse. ? ?Lungs: ?Lungs are clear to auscultation. There is no wheeze, rhonchi, or crackles. There is normal expansion of bilateral chest walls.  ? ?Right knee: ?On exam, right knee range of motion of 5 to 105 degrees. Varus deformity that is passively correctable. Marked crepitation on range of motion. No swelling warmth erythema or edema throughout the right knee. No effusion. ? ?Radiographs: ?X-rays of the right knee reviewed by me today from 09/30/2020 shows advanced tricompartmental osteoarthritis with near complete loss of joint space in the medial compartment with varus deformity. Subchondral cyst formation noted along with spurring throughout the medial lateral and patellofemoral compartments. Lateral tracking of the patella in the trochlear groove. ? ?Assessment: ?ICD-10-CM  ?1. Primary osteoarthritis of right knee M17.11  ? ?Plan: ? ?79. 81 year old male with severe right knee osteoarthritis. Patient's had no  relief with conservative treatment. Pain interferes with quality of life and activities daily living. Risks, benefits, complications of a right total knee arthroplasty have been discussed with the patient. Patient has agreed and consented procedure with Dr. Hessie Knows on 03/21/2021. ? ?Electronically signed by Lawrence Perkins, [place] at 03/13/2021 9:12 AM EDT ? ?Reviewed  H+P. ?No changes noted. ? ?

## 2021-03-21 NOTE — Op Note (Signed)
03/21/2021 ? ?9:31 AM ? ?PATIENT:  Lawrence Perkins.  ? ?MRN: [id number] ? ?PRE-OPERATIVE DIAGNOSIS:  Primary localized osteoarthritis of right knee ?  ?POST-OPERATIVE DIAGNOSIS:  Same ?  ?PROCEDURE:  Procedure(s): ?Right TOTAL KNEE ARTHROPLASTY ?  ?SURGEON: Laurene Footman, MD ?  ?ASSISTANTS: Rachelle Hora, PA-C ?  ?ANESTHESIA:   spinal ?  ?EBL: 300 cc ?  ?BLOOD ADMINISTERED:none ?  ?DRAINS:  Incisional wound VAC   ?  ?LOCAL MEDICATIONS USED:  MARCAINE    and OTHER Exparel and morphine ?  ?SPECIMEN:  No Specimen ?  ?DISPOSITION OF SPECIMEN:  N/A ?  ?COUNTS:  YES ?  ?TOURNIQUET:  26 at 300 mm Hg ?  ?IMPLANTS: Medacta  GMK sphere system with right 6 femur, right 5 tibia with short stem and 10 mm insert.  Size 3 patella, all components cemented. ?  ?DICTATION: .Dragon Dictation   patient was brought to the operating room and spinal anesthesia was obtained.  After prepping and draping the right leg in sterile fashion, and after patient identification and timeout procedures were completed  and midline skin incision was made followed by medial parapatellar arthrotomy with severe medial compartment osteoarthritis, severe with significant erosion of the bone on the patella patellofemoral arthritis and advanced lateral compartment arthritis, partial synovectomy was also carried out.   The ACL and PCL and fat pad were excised along with anterior horns of the meniscus. The proximal tibia cutting guide from  the [facility] system was applied and the proximal tibia cut carried out.  The distal femoral cut was carried out in a similar fashion     The 6 femoral cutting guide applied with anterior posterior and chamfer cuts made.  The posterior horns of the menisci were removed at this point.   Injection of the above medication was carried out after the femoral and tibial cuts were carried out.  The 5 baseplate trial was placed pinned into position and proximal tibial preparation carried out with drilling hand reaming and the keel punch  followed by placement of the 6 femur and sizing the tibial insert size 10 millimeter gave the best fit with stability and full extension.  The distal femoral drill holes were made in the notch cut for the trochlear groove was then carried out with trials were then removed the patella was cut using the patellar cutting guide and it sized to a size 3 after drill holes have been made tourniquet was raised at this point.  The knee was irrigated with pulsatile lavage and the bony surfaces dried the tibial component was cemented into place first.  Excess cement was removed and the polyethylene insert placed with a torque screw placed with a torque screwdriver tightened.  The distal femoral component was placed and the knee was held in extension as the patellar button was clamped into place.  After the cement was set, excess cement was removed and the knee was again irrigated thoroughly thoroughly irrigated.  The tourniquet was let down and hemostasis checked with electrocautery. The arthrotomy was repaired with a heavy Quill suture,  followed by 3-0 V lock subcuticular closure, skin staples followed by incisional wound VAC and Polar Care.. ?  ?PLAN OF CARE: Admit for overnight observation ?  ?PATIENT DISPOSITION:  PACU - hemodynamically stable. ?  ?  ?   ?  ?  ?  ? ? ?

## 2021-03-21 NOTE — Progress Notes (Signed)
PT Cancellation Note ? ?Patient Details ?Name: Lawrence Perkins. ?MRN: [id number] ?DOB: 02-Jan-1941 ? ? ?Cancelled Treatment:    Reason Eval/Treat Not Completed: Pain limiting ability to participate. Orders received and chart reviewed. Upon entry to room pt reporting 8/10 R knee pain requesting improved pain management prior to PT eval. RN notified. Will re-attempt at later time/date as appropriate. ? ? ?Salem Caster. Fairly IV, PT, DPT ?Physical Therapist- [place]  ?[facility]  ?03/21/2021, 1:34 PM ?

## 2021-03-21 NOTE — Progress Notes (Signed)
OT Cancellation Note ? ?Patient Details ?Name: Lawrence Perkins. ?MRN: [id number] ?DOB: 30-Dec-1940 ? ? ?Cancelled Treatment:    Reason Eval/Treat Not Completed: Patient declined, no reason specified. Orders received and chart reviewed. Per chart review and discussion with tx pt, pt reporting 8/10 knee pain this PM and requesting improved pain management prior to therapy evaluation. Will re-attempt at later time/date as able.  ? ?Fredirick Maudlin, OTR/L ?[place] ? ?

## 2021-03-21 NOTE — Evaluation (Signed)
Physical Therapy Evaluation ?Patient Details ?Name: Lawrence Perkins. ?MRN: [id number] ?DOB: 1940-04-13 ?Today's Date: 03/21/2021 ? ?History of Present Illness ? Pt is a 81 y.o. male s/p elective R TKA. PMH includes: CAD, cardiomyopathy, CHF, atrial fibrillation, aortic atherosclerosis, cardiac syncope, CKD-IV, HTN, HLD, OA, BPH. ?  ?Clinical Impression ? Pt admitted with above diagnosis. Pt received supine in bed agreeable to PT eval to his tolerance due to his pain levels and nausea. Pt tolerating LE therex with minimal pain  and good form/technique. Pt progressed supine ti sitting Eob with HOB slightly elevated with use of bed rail and supervision and increased time to perform. Once seated EOB pt became nauseous vomiting multiple times declining further therapy today. Pt performing x3 L lateral scoots to [place] requiring minA at LE's to return to supine. Education provided on appropriate knee positioning with donning of bone foam on LLE with polar care applied. All needs in reach. Anticipate pending completion of transfers, gait, and stairs, pt safe to d/c home with [facility] PT services. Pt and family aware of expectations for safe d/c home. Pt currently with functional limitations due to the deficits listed below (see PT Problem List). Pt will benefit from skilled PT to increase their independence and safety with mobility to allow discharge to the venue listed below.    ?   ? ?Recommendations for follow up therapy are one component of a multi-disciplinary discharge planning process, led by the attending physician.  Recommendations may be updated based on patient status, additional functional criteria and insurance authorization. ? ?Follow Up Recommendations Home health PT ? ?  ?Assistance Recommended at Discharge Intermittent Supervision/Assistance  ?Patient can return home with the following ? A little help with walking and/or transfers;Assistance with cooking/housework;Assist for transportation;Help with stairs or ramp  for entrance ? ?  ?Equipment Recommendations None recommended by PT  ?Recommendations for Other Services ?    ?  ?Functional Status Assessment Patient has had a recent decline in their functional status and demonstrates the ability to make significant improvements in function in a reasonable and predictable amount of time.  ? ?  ?Precautions / Restrictions Precautions ?Precautions: Fall;Knee ?Precaution Booklet Issued: No ?Restrictions ?Weight Bearing Restrictions: Yes ?RLE Weight Bearing: Weight bearing as tolerated  ? ?  ? ?Mobility ? Bed Mobility ?Overal bed mobility: Needs Assistance ?Bed Mobility: Supine to Sit, Sit to Supine ?  ?  ?Supine to sit: Supervision, HOB elevated ?Sit to supine: Min assist ?  ?General bed mobility comments: minA at LE's returning to supine ?Patient Response: Cooperative ? ?Transfers ?  ?  ?  ?  ?  ?  ?  ?  ?  ?General transfer comment: Only tolerating sitting EOB due to nausea and vomiting ?  ? ?Ambulation/Gait ?  ?  ?  ?  ?  ?  ?  ?General Gait Details: deferred ? ?Stairs ?  ?  ?  ?  ?  ? ?Wheelchair Mobility ?  ? ?Modified Rankin (Stroke Patients Only) ?  ? ?  ? ?Balance Overall balance assessment: Needs assistance ?Sitting-balance support: Bilateral upper extremity supported, Feet supported ?Sitting balance-Leahy Scale: Fair ?  ?  ?  ?  ?Standing balance comment: NT ?  ?  ?  ?  ?  ?  ?  ?  ?  ?  ?  ?   ? ? ? ?Pertinent Vitals/Pain Pain Assessment ?Pain Assessment: 0-10 ?Pain Score: 3  ?Pain Location: R knee ?Pain Descriptors / Indicators: Discomfort, Grimacing,  Guarding ?Pain Intervention(s): Limited activity within patient's tolerance, Monitored during session, Premedicated before session, Repositioned, Ice applied  ? ? ?Home Living Family/patient expects to be discharged to:: Private residence ?Living Arrangements: Spouse/significant other ?Available Help at Discharge: Family;Available 24 hours/day ?Type of Home: House ?Home Access: Stairs to enter ?Entrance Stairs-Rails:  Right;Left ?Entrance Stairs-Number of Steps: 3-4 ?  ?Home Layout: One level ?Home Equipment: Conservation officer, nature (2 wheels);Cane - single point;BSC/3in1;Shower seat - built in ?   ?  ?Prior Function Prior Level of Function : Independent/Modified Independent ?  ?  ?  ?  ?  ?  ?  ?  ?  ? ? ?Hand Dominance  ?   ? ?  ?Extremity/Trunk Assessment  ? Upper Extremity Assessment ?Upper Extremity Assessment: Defer to OT evaluation ?  ? ?Lower Extremity Assessment ?Lower Extremity Assessment: Generalized weakness;RLE deficits/detail ?RLE Deficits / Details: R TKA ?RLE Sensation: WNL ?  ? ?Cervical / Trunk Assessment ?Cervical / Trunk Assessment: Normal  ?Communication  ? Communication: No difficulties  ?Cognition Arousal/Alertness: Awake/alert ?Behavior During Therapy: [facility] for tasks assessed/performed ?Overall Cognitive Status: Within Functional Limits for tasks assessed ?  ?  ?  ?  ?  ?  ?  ?  ?  ?  ?  ?  ?  ?  ?  ?  ?  ?  ?  ? ?  ?General Comments   ? ?  ?Exercises Total Joint Exercises ?Ankle Circles/Pumps: AROM, Strengthening, Both, 20 reps, Supine ?Quad Sets: AROM, Strengthening, Right, 10 reps, Supine ?Other Exercises ?Other Exercises: Role of PT in acute setting, D/c recs, WB'ing precautions  ? ?Assessment/Plan  ?  ?PT Assessment Patient needs continued PT services  ?PT Problem List Decreased strength;Decreased mobility;Decreased range of motion;Decreased activity tolerance;Pain;Decreased knowledge of use of DME ? ?   ?  ?PT Treatment Interventions DME instruction;Therapeutic exercise;Gait training;Balance training;Stair training;Neuromuscular re-education;Functional mobility training;Therapeutic activities;Patient/family education   ? ?PT Goals (Current goals can be found in the Care Plan section)  ?Acute Rehab PT Goals ?Patient Stated Goal: improve pain and nausea ?PT Goal Formulation: With patient ?Time For Goal Achievement: 04/04/21 ?Potential to Achieve Goals: Good ? ?  ?Frequency BID ?  ? ? ?Co-evaluation   ?  ?  ?  ?   ? ? ?  ?AM-PAC PT "6 Clicks" Mobility  ?Outcome Measure Help needed turning from your back to your side while in a flat bed without using bedrails?: A Little ?Help needed moving from lying on your back to sitting on the side of a flat bed without using bedrails?: A Little ?Help needed moving to and from a bed to a chair (including a wheelchair)?: A Little ?Help needed standing up from a chair using your arms (e.g., wheelchair or bedside chair)?: A Little ?Help needed to walk in hospital room?: A Lot ?Help needed climbing 3-5 steps with a railing? : A Lot ?6 Click Score: 16 ? ?  ?End of Session Equipment Utilized During Treatment: Gait belt ?Activity Tolerance: Treatment limited secondary to medical complications (Comment) (nausea and vomiting) ?Patient left: in bed;with family/visitor present;with call bell/phone within reach;with bed alarm set;with SCD's reapplied ?Nurse Communication: Mobility status ?PT Visit Diagnosis: Other abnormalities of gait and mobility (R26.89);Muscle weakness (generalized) (M62.81);Difficulty in walking, not elsewhere classified (R26.2);Pain ?Pain - Right/Left: Right ?Pain - part of body: Knee ?  ? ?Time: 3220-2542 ?PT Time Calculation (min) (ACUTE ONLY): 25 min ? ? ?Charges:   PT Evaluation ?$PT Eval Low Complexity: 1 Low ?PT Treatments ?$  Therapeutic Exercise: 8-22 mins ?  ?   ? ?Salem Caster. Fairly IV, PT, DPT ?Physical Therapist- [place]  ?[facility]  ?03/21/2021, 4:04 PM ? ?

## 2021-03-21 NOTE — TOC Progression Note (Signed)
Transition of Care (TOC) - Progression Note  ? ? ?Patient Details  ?Name: Lawrence Perkins. ?MRN: [id number] ?Date of Birth: Nov 07, 1940 ? ?Transition of Care (TOC) CM/SW Contact  ?Conception Oms, RN ?Phone Number: ?03/21/2021, 2:51 PM ? ?Clinical Narrative:   [name] has accepted this patient for [place] ? ? ? ?  ?  ? ?Expected Discharge Plan and Services ?  ?  ?  ?  ?  ?                ?  ?  ?  ?  ?  ?  ?  ?  ?  ?  ? ? ?Social Determinants of Health (SDOH) Interventions ?  ? ?Readmission Risk Interventions ?No flowsheet data found. ? ?

## 2021-03-21 NOTE — Anesthesia Procedure Notes (Signed)
Spinal ? ?Patient location during procedure: OR ?Start time: 03/21/2021 7:20 AM ?End time: 03/21/2021 7:35 AM ?Reason for block: surgical anesthesia ?Staffing ?Performed: anesthesiologist and resident/CRNA  ?Anesthesiologist: Molli Barrows, MD ?Resident/CRNA: Esaw Grandchild, CRNA ?Preanesthetic Checklist ?Completed: patient identified, IV checked, site marked, risks and benefits discussed, surgical consent, monitors and equipment checked, pre-op evaluation and timeout performed ?Spinal Block ?Patient position: sitting ?Prep: ChloraPrep ?Patient monitoring: heart rate, continuous pulse ox and blood pressure ?Approach: midline ?Location: L4-5 ?Injection technique: single-shot ?Needle ?Needle type: Quincke  ?Needle gauge: 22 G ?Needle length: 10 cm ?Assessment ?Sensory level: T4 ?Events: CSF return and second provider ?Additional Notes ?1st attempt by CRNA Nyoka Cowden, 2nd attempt by Dr. Bertell Maria, 3rd attempt by Dr. Andree Elk successful, Pt tolerated procedure throughout, VSS ? ? ? ?

## 2021-03-22 ENCOUNTER — Encounter: Payer: Self-pay | Admitting: Orthopedic Surgery

## 2021-03-22 ENCOUNTER — Observation Stay: Payer: Medicare HMO

## 2021-03-22 DIAGNOSIS — N2581 Secondary hyperparathyroidism of renal origin: Secondary | ICD-10-CM | POA: Diagnosis not present

## 2021-03-22 DIAGNOSIS — D631 Anemia in chronic kidney disease: Secondary | ICD-10-CM | POA: Diagnosis not present

## 2021-03-22 DIAGNOSIS — E875 Hyperkalemia: Secondary | ICD-10-CM | POA: Diagnosis not present

## 2021-03-22 DIAGNOSIS — N179 Acute kidney failure, unspecified: Secondary | ICD-10-CM | POA: Diagnosis not present

## 2021-03-22 DIAGNOSIS — N184 Chronic kidney disease, stage 4 (severe): Secondary | ICD-10-CM | POA: Diagnosis not present

## 2021-03-22 LAB — BASIC METABOLIC PANEL
Anion gap: 7 (ref 5–15)
BUN: 65 mg/dL — ABNORMAL HIGH (ref 8–23)
CO2: 21 mmol/L — ABNORMAL LOW (ref 22–32)
Calcium: 8 mg/dL — ABNORMAL LOW (ref 8.9–10.3)
Chloride: 110 mmol/L (ref 98–111)
Creatinine, Ser: 4.38 mg/dL — ABNORMAL HIGH (ref 0.61–1.24)
GFR, Estimated: 13 mL/min — ABNORMAL LOW (ref >60)
Glucose, Bld: 135 mg/dL — ABNORMAL HIGH (ref 70–99)
Potassium: 5.4 mmol/L — ABNORMAL HIGH (ref 3.5–5.1)
Sodium: 138 mmol/L (ref 135–145)

## 2021-03-22 LAB — CBC
HCT: 25.9 % — ABNORMAL LOW (ref 39.0–52.0)
Hemoglobin: 8.7 g/dL — ABNORMAL LOW (ref 13.0–17.0)
MCH: 32.6 pg (ref 26.0–34.0)
MCHC: 33.6 g/dL (ref 30.0–36.0)
MCV: 97 fL (ref 80.0–100.0)
Platelets: 185 10*3/uL (ref 150–400)
RBC: 2.67 MIL/uL — ABNORMAL LOW (ref 4.22–5.81)
RDW: 12.5 % (ref 11.5–15.5)
WBC: 6.1 10*3/uL (ref 4.0–10.5)
nRBC: 0 % (ref 0.0–0.2)

## 2021-03-22 LAB — GLUCOSE, CAPILLARY: Glucose-Capillary: 137 mg/dL — ABNORMAL HIGH (ref 70–99)

## 2021-03-22 MED ORDER — CLONIDINE HCL 0.1 MG PO TABS
0.2000 mg | ORAL_TABLET | Freq: Once | ORAL | Status: AC
  Administered 2021-03-22: 0.2 mg via ORAL
  Filled 2021-03-22: qty 2

## 2021-03-22 MED ORDER — METHOCARBAMOL 500 MG PO TABS
500.0000 mg | ORAL_TABLET | Freq: Four times a day (QID) | ORAL | 0 refills | Status: DC | PRN
Start: 2021-03-22 — End: 2021-05-09

## 2021-03-22 MED ORDER — TRAMADOL HCL 50 MG PO TABS: 50.0000 mg | ORAL_TABLET | Freq: Four times a day (QID) | ORAL | 0 refills | Status: DC

## 2021-03-22 MED ORDER — DOCUSATE SODIUM 100 MG PO CAPS: 100.0000 mg | ORAL_CAPSULE | Freq: Two times a day (BID) | ORAL | 0 refills | Status: DC

## 2021-03-22 MED ORDER — HYDROCODONE-ACETAMINOPHEN 7.5-325 MG PO TABS: 1.0000 | ORAL_TABLET | ORAL | 0 refills | Status: DC | PRN

## 2021-03-22 MED ORDER — FE FUMARATE-B12-VIT C-FA-IFC PO CAPS
1.0000 | ORAL_CAPSULE | Freq: Two times a day (BID) | ORAL | Status: DC
  Administered 2021-03-22 – 2021-03-29 (×12): 1 via ORAL
  Filled 2021-03-22 (×16): qty 1

## 2021-03-22 MED ORDER — SODIUM ZIRCONIUM CYCLOSILICATE 10 G PO PACK
10.0000 g | PACK | Freq: Once | ORAL | Status: AC
  Administered 2021-03-22: 10 g via ORAL
  Filled 2021-03-22: qty 1

## 2021-03-22 NOTE — Discharge Instructions (Signed)
? ?TOTAL KNEE REPLACEMENT POSTOPERATIVE DIRECTIONS ? ?Knee Rehabilitation, Guidelines Following Surgery  ?Results after knee surgery are often greatly improved when you follow the exercise, range of motion and muscle strengthening exercises prescribed by your doctor. Safety measures are also important to protect the knee from further injury. Any time any of these exercises cause you to have increased pain or swelling in your knee joint, decrease the amount until you are comfortable again and slowly increase them. If you have problems or questions, call your caregiver or physical therapist for advice.  ? ?HOME CARE INSTRUCTIONS  ?Remove items at home which could result in a fall. This includes throw rugs or furniture in walking pathways.  ?Continue to use polar care unit on the knee for pain and swelling from surgery. You may notice swelling that will progress down to the foot and ankle.  This is normal after surgery.  Elevate the leg when you are not up walking on it.   ?Continue to use the breathing machine which will help keep your temperature down.  It is common for your temperature to cycle up and down following surgery, especially at night when you are not up moving around and exerting yourself.  The breathing machine keeps your lungs expanded and your temperature down. ?Do not place pillow under knee, focus on keeping the knee STRAIGHT while resting ? ?DIET ?You may resume your previous home diet once your are discharged from the hospital. ? ?DRESSING / WOUND CARE / SHOWERING ?Please remove provena negative pressure dressing on 03/29/2021 and apply honey comb dressing. Keep dressing clean and dry at all times.  ? ?ACTIVITY ?Walk with your walker as instructed. ?Use walker as long as suggested by your caregivers. ?Avoid periods of inactivity such as sitting longer than an hour when not asleep. This helps prevent blood clots.  ?You may resume a sexual relationship in one month or when given the OK by your  doctor.  ?You may return to work once you are cleared by your doctor.  ?Do not drive a car for 6 weeks or until released by you surgeon.  ?Do not drive while taking narcotics. ? ?WEIGHT BEARING ?Weight bearing as tolerated with assist device (walker, cane, etc) as directed, use it as long as suggested by your surgeon or therapist, typically at least 4-6 weeks. ? ?POSTOPERATIVE CONSTIPATION PROTOCOL ?Constipation - defined medically as fewer than three stools per week and severe constipation as less than one stool per week. ? ?One of the most common issues patients have following surgery is constipation.  Even if you have a regular bowel pattern at home, your normal regimen is likely to be disrupted due to multiple reasons following surgery.  Combination of anesthesia, postoperative narcotics, change in appetite and fluid intake all can affect your bowels.  In order to avoid complications following surgery, here are some recommendations in order to help you during your recovery period. ? ?Colace (docusate) - Pick up an over-the-counter form of Colace or another stool softener and take twice a day as long as you are requiring postoperative pain medications.  Take with a full glass of water daily.  If you experience loose stools or diarrhea, hold the colace until you stool forms back up.  If your symptoms do not get better within 1 week or if they get worse, check with your doctor. ? ?Dulcolax (bisacodyl) - Pick up over-the-counter and take as directed by the product packaging as needed to assist with the movement of your bowels.  Take with a full glass of water.  Use this product as needed if not relieved by Colace only.  ? ?MiraLax (polyethylene glycol) - Pick up over-the-counter to have on hand.  MiraLax is a solution that will increase the amount of water in your bowels to assist with bowel movements.  Take as directed and can mix with a glass of water, juice, soda, coffee, or tea.  Take if you go more than two  days without a movement. ?Do not use MiraLax more than once per day. Call your doctor if you are still constipated or irregular after using this medication for 7 days in a row. ? ?If you continue to have problems with postoperative constipation, please contact the office for further assistance and recommendations.  If you experience "the worst abdominal pain ever" or develop nausea or vomiting, please contact the office immediatly for further recommendations for treatment. ? ?ITCHING ? If you experience itching with your medications, try taking only a single pain pill, or even half a pain pill at a time.  You can also use Benadryl over the counter for itching or also to help with sleep.  ? ?TED HOSE STOCKINGS ?Wear the elastic stockings on both legs for six weeks following surgery during the day but you may remove then at night for sleeping. ? ?MEDICATIONS ?See your medication summary on the ?After Visit Summary? that the nursing staff will review with you prior to discharge.  You may have some home medications which will be placed on hold until you complete the course of blood thinner medication.  It is important for you to complete the blood thinner medication as prescribed by your surgeon.  Continue your approved medications as instructed at time of discharge. ? ?PRECAUTIONS ?If you experience chest pain or shortness of breath - call 911 immediately for transfer to the hospital emergency department.  ?If you develop a fever greater that 101 F, purulent drainage from wound, increased redness or drainage from wound, foul odor from the wound/dressing, or calf pain - CONTACT YOUR SURGEON.   ?                                                ?FOLLOW-UP APPOINTMENTS ?Make sure you keep all of your appointments after your operation with your surgeon and caregivers. You should call the office at the above phone number and make an appointment for approximately two weeks after the date of your surgery or on the date  instructed by your surgeon outlined in the "After Visit Summary". ? ? ?RANGE OF MOTION AND STRENGTHENING EXERCISES  ?Rehabilitation of the knee is important following a knee injury or an operation. After just a few days of immobilization, the muscles of the thigh which control the knee become weakened and shrink (atrophy). Knee exercises are designed to build up the tone and strength of the thigh muscles and to improve knee motion. Often times heat used for twenty to thirty minutes before working out will loosen up your tissues and help with improving the range of motion but do not use heat for the first two weeks following surgery. These exercises can be done on a training (exercise) mat, on the floor, on a table or on a bed. Use what ever works the best and is most comfortable for you Knee exercises include:  ?Leg Lifts - While your  knee is still immobilized in a splint or cast, you can do straight leg raises. Lift the leg to 60 degrees, hold for 3 sec, and slowly lower the leg. Repeat 10-20 times 2-3 times daily. Perform this exercise against resistance later as your knee gets better.  ?Quad and Hamstring Sets - Tighten up the muscle on the front of the thigh (Quad) and hold for 5-10 sec. Repeat this 10-20 times hourly. Hamstring sets are done by pushing the foot backward against an object and holding for 5-10 sec. Repeat as with quad sets.  ?Leg Slides: Lying on your back, slowly slide your foot toward your buttocks, bending your knee up off the floor (only go as far as is comfortable). Then slowly slide your foot back down until your leg is flat on the floor again. ?Angel Wings: Lying on your back spread your legs to the side as far apart as you can without causing discomfort.  ?A rehabilitation program following serious knee injuries can speed recovery and prevent re-injury in the future due to weakened muscles. Contact your doctor or a physical therapist for more information on knee rehabilitation.  ? ?IF YOU  ARE TRANSFERRED TO A SKILLED REHAB FACILITY ?If the patient is transferred to a skilled rehab facility following release from the hospital, a list of the current medications will be sent to the facility for the patie

## 2021-03-22 NOTE — Anesthesia Postprocedure Evaluation (Signed)
Anesthesia Post Note ? ?Patient: Lawrence Perkins. ? ?Procedure(s) Performed: TOTAL KNEE ARTHROPLASTY (Right: Knee) ? ?Patient location during evaluation: Nursing Unit ?Anesthesia Type: Spinal ?Level of consciousness: oriented and awake and alert ?Pain management: pain level controlled ?Vital Signs Assessment: post-procedure vital signs reviewed and stable ?Respiratory status: spontaneous breathing and respiratory function stable ?Cardiovascular status: blood pressure returned to baseline and stable ?Postop Assessment: no headache, no backache, no apparent nausea or vomiting, patient able to bend at knees, spinal receding and adequate PO intake ?Anesthetic complications: no ? ? ?No notable events documented. ? ? ?Last Vitals:  ?Vitals:  ? 03/22/21 0540 03/22/21 0758  ?BP: (!) 153/78 (!) 158/78  ?Pulse: 75 70  ?Resp: 16 16  ?Temp: 36.5 ?C 36.6 ?C  ?SpO2: 97% 98%  ?  ?Last Pain:  ?Vitals:  ? 03/22/21 0540  ?TempSrc: Oral  ?PainSc:   ? ? ?  ?  ?  ?  ?  ?  ? ?Rolla Plate P ? ? ? ? ?

## 2021-03-22 NOTE — Progress Notes (Signed)
Physical Therapy Treatment ?Patient Details ?Name: Lawrence Perkins. ?MRN: [id number] ?DOB: 1940-06-13 ?Today's Date: 03/22/2021 ? ? ?History of Present Illness Pt is a 81 y.o. male s/p elective R TKA. PMH includes: CAD, cardiomyopathy, CHF, atrial fibrillation, aortic atherosclerosis, cardiac syncope, CKD-IV, HTN, HLD, OA, BPH. ? ?  ?PT Comments  ? ? Pt was long sitting in bed upon arriving. He agrees to session requesting to use urinal to urinate. Continues to require assistance to safely exit L side of bed. Stood 1 x CGA for safety. Tolerated standing ~ 2 minutes but was unsuccessful with peeing. Pt c/o severe onset of nausea and requested emesis bag. He does have active vomiting and was repositioned back into bed. RN and MD made aware. Acute PT will return tomorrow and continue to follow per current POC.  ?   ?Recommendations for follow up therapy are one component of a multi-disciplinary discharge planning process, led by the attending physician.  Recommendations may be updated based on patient status, additional functional criteria and insurance authorization. ? ?Follow Up Recommendations ? Home health PT ?  ?  ?Assistance Recommended at Discharge Intermittent Supervision/Assistance  ?Patient can return home with the following A little help with walking and/or transfers;Assistance with cooking/housework;Assist for transportation;Help with stairs or ramp for entrance ?  ?Equipment Recommendations ? None recommended by PT  ?  ?   ?Precautions / Restrictions Precautions ?Precautions: Fall;Knee ?Precaution Booklet Issued: Yes (comment) ?Restrictions ?Weight Bearing Restrictions: Yes ?RLE Weight Bearing: Weight bearing as tolerated  ?  ? ?Mobility ? Bed Mobility ?Overal bed mobility: Needs Assistance ?Bed Mobility: Supine to Sit ?  ?  ?Supine to sit: Min assist, HOB elevated ?  ?  ?General bed mobility comments: Increased time to perform with author assisting RLE to EOB. ?  ? ?Transfers ?Overall transfer level:  Needs assistance ?Equipment used: Rolling walker (2 wheels) ?Transfers: Sit to/from Stand ?Sit to Stand: Min guard ?  ?  ?  ?  ?  ?General transfer comment: Pt was able to stand EOB 1 x to attempt to urinate however unable. He does get extremely nausous and throws up. RN and MD made aware ?  ? ?Ambulation/Gait ?Ambulation/Gait assistance: Supervision ?Gait Distance (Feet): 15 Feet ?Assistive device: Rolling walker (2 wheels) ?Gait Pattern/deviations: Step-to pattern ?Gait velocity: decreased ?  ?  ?General Gait Details: pt vomiting. will defer to next PT session ? ?  ?Balance Overall balance assessment: Needs assistance ?Sitting-balance support: Feet supported ?Sitting balance-Leahy Scale: Good ?Sitting balance - Comments: no LOB in sitting ?  ?Standing balance support: Bilateral upper extremity supported, During functional activity, Reliant on assistive device for balance ?Standing balance-Leahy Scale: Fair ?Standing balance comment: reliant on RW ?  ?   ?Cognition Arousal/Alertness: Awake/alert ?Behavior During Therapy: [facility] for tasks assessed/performed ?Overall Cognitive Status: Within Functional Limits for tasks assessed ?  ?   ?General Comments: Pt is A an O x 4. Has flat affect but is cooperative and pleasant ?  ?  ? ?  ?   ?   ? ?Pertinent Vitals/Pain Pain Assessment ?Pain Assessment: No/denies pain ?Pain Score: 0-No pain ?Pain Location: R knee ?Pain Descriptors / Indicators: Discomfort, Grimacing, Guarding ?Pain Intervention(s): Limited activity within patient's tolerance, Monitored during session, Premedicated before session, Repositioned, Ice applied  ? ? ? ?PT Goals (current goals can now be found in the care plan section) Acute Rehab PT Goals ?Patient Stated Goal: go home when I'm ready ?Progress towards PT goals: Progressing toward goals ? ?  ?  Frequency ? ? ? BID ? ? ? ?  ?PT Plan Current plan remains appropriate  ? ? ?   ?AM-PAC PT "6 Clicks" Mobility   ?Outcome Measure ? Help needed turning from your  back to your side while in a flat bed without using bedrails?: A Little ?Help needed moving from lying on your back to sitting on the side of a flat bed without using bedrails?: A Little ?Help needed moving to and from a bed to a chair (including a wheelchair)?: A Little ?Help needed standing up from a chair using your arms (e.g., wheelchair or bedside chair)?: A Little ?Help needed to walk in hospital room?: A Little ?Help needed climbing 3-5 steps with a railing? : A Little ?6 Click Score: 18 ? ?  ?End of Session Equipment Utilized During Treatment: Gait belt ?Activity Tolerance: Patient tolerated treatment well ?Patient left: in bed;with call bell/phone within reach;with bed alarm set ?Nurse Communication: Mobility status ?PT Visit Diagnosis: Other abnormalities of gait and mobility (R26.89);Muscle weakness (generalized) (M62.81);Difficulty in walking, not elsewhere classified (R26.2);Pain ?Pain - Right/Left: Right ?Pain - part of body: Knee ?  ? ? ?Time: 5400-8676 ?PT Time Calculation (min) (ACUTE ONLY): 19 min ? ?Charges:  $Therapeutic Activity: 8-22 mins          ?          ? ?Julaine Fusi PTA ?03/22/21, 4:34 PM  ? ?

## 2021-03-22 NOTE — Progress Notes (Signed)
PT Cancellation Note ? ?Patient Details ?Name: Lawrence Perkins. ?MRN: [id number] ?DOB: [birth year] ? ? ?Cancelled Treatment:     PT attempt. Pt frustrated with all the people in/out of his room today. He requested to sleep before having another PT session. Author will return later this date and will continue to follow per current POC.  ? ? ?Willette Pa ?03/22/2021, 1:48 PM ?

## 2021-03-22 NOTE — Consult Note (Addendum)
?[facility]  ?CONSULT NOTE  ? ? ?Date: 03/22/2021      ?      ?      ?Patient Name:  Lawrence Perkins.  MRN: [id number]  ?DOB: May 28, 1940  Age / Sex: 81 y.o., male   ?      ?PCP: Kirk Ruths, MD     ?      ?      ?Service Requesting Consult: TRH     ?      ?      ?Reason for Consult: Acute kidney injury     ?      ? ?History of Present Illness: ?Lawrence Perkins. is a 81 y.o.  male with past medical conditions including hypertension, diverticulosis, CAD, CHF, atrial fibrillation on Eliquis, and chronic kidney disease stage IV, who was admitted to [facility] on 03/21/2021 for S/P TKR (total knee replacement) using cement, right [Z96.651] ? ?Patient presents to the hospital yesterday for scheduled right total knee arthroplasty with Dr. Kyla Balzarine.  Patient currently seen [facility] nephrology outpatient and chart review shows steady decline of renal function over the past few months.  Patient currently sitting up in chair with wife at bedside.  They voiced their concern about current renal function but no his outpatient labs have shown decline.  They state they have not discussed initiation of renal replacement therapy with outpatient nephrology.  Denies use of NSAIDs.  According to wife, patient has maintained poor appetite but has maintained fluid intake. ? ?Creatinine on hospital arrival 4.9 with BUN 56.  Creatinine slightly improved at 4.38 this morning.  However potassium 5.4. ? ? ?Medications: ?Outpatient medications: ?Medications Prior to Admission  ?Medication Sig Dispense Refill Last Dose  ? apixaban (ELIQUIS) 2.5 MG TABS tablet Take 2.5 mg by mouth 2 (two) times daily.   03/16/2021  ? carvedilol (COREG) 3.125 MG tablet Take 3.125 mg by mouth 2 (two) times daily with a meal.    03/21/2021 at 0500  ? cholecalciferol (VITAMIN D3) 25 MCG (1000 UNIT) tablet Take 1,000 Units by mouth daily.   03/14/2021  ? hydrocortisone cream 1 % Apply 1 application topically 2 (two) times daily as needed for  itching.   Past Month  ? ibuprofen (ADVIL) 200 MG tablet Take 400 mg by mouth every 6 (six) hours as needed for moderate pain. (Patient not taking: Reported on 03/09/2021)     ? Multiple Vitamin (MULTIVITAMIN WITH MINERALS) TABS tablet Take 1 tablet by mouth daily.   Past Week  ? rosuvastatin (CRESTOR) 10 MG tablet Take 10 mg by mouth at bedtime.   03/20/2021  ? tamsulosin (FLOMAX) 0.4 MG CAPS capsule Take 1 capsule (0.4 mg total) by mouth daily. 90 capsule 3 03/20/2021  ? torsemide (DEMADEX) 20 MG tablet Take 2 tablets (40 mg total) by mouth daily. 60 tablet 0 03/21/2021 at 0500  ? ? ?Current medications: ?Current Facility-Administered Medications  ?Medication Dose Route Frequency Provider Last Rate Last Admin  ? 0.9 %  sodium chloride infusion   Intravenous Continuous Hessie Knows, MD 75 mL/hr at 03/21/21 2351 New Bag at 03/21/21 2351  ? acetaminophen (TYLENOL) tablet 325-650 mg  325-650 mg Oral Q6H PRN Hessie Knows, MD      ? alum & mag hydroxide-simeth (MAALOX/MYLANTA) 200-200-20 MG/5ML suspension 30 mL  30 mL Oral Q4H PRN Hessie Knows, MD      ? apixaban Arne Cleveland) tablet 2.5 mg  2.5 mg Oral BID Hessie Knows,  MD      ? bisacodyl (DULCOLAX) EC tablet 5 mg  5 mg Oral Daily PRN Hessie Knows, MD      ? carvedilol (COREG) tablet 3.125 mg  3.125 mg Oral BID WC Hessie Knows, MD   3.125 mg at 03/21/21 1617  ? cholecalciferol (VITAMIN D3) tablet 1,000 Units  1,000 Units Oral Daily Hessie Knows, MD   1,000 Units at 03/21/21 1125  ? diphenhydrAMINE (BENADRYL) 12.5 MG/5ML elixir 12.5-25 mg  12.5-25 mg Oral Q4H PRN Hessie Knows, MD      ? docusate sodium (COLACE) capsule 100 mg  100 mg Oral BID Hessie Knows, MD   100 mg at 03/21/21 2116  ? ferrous [id number] C-folic acid (TRINSICON / FOLTRIN) capsule 1 capsule  1 capsule Oral BID PC Hessie Knows, MD      ? HYDROcodone-acetaminophen (NORCO) 7.5-325 MG per tablet 1-2 tablet  1-2 tablet Oral Q4H PRN Hessie Knows, MD   1 tablet at 03/21/21 1350  ?  HYDROcodone-acetaminophen (NORCO/VICODIN) 5-325 MG per tablet 1-2 tablet  1-2 tablet Oral Q4H PRN Hessie Knows, MD      ? hydrocortisone cream 1 % 1 application.  1 application. Topical BID PRN Hessie Knows, MD      ? menthol-cetylpyridinium (CEPACOL) lozenge 3 mg  1 lozenge Oral PRN Hessie Knows, MD      ? Or  ? phenol (CHLORASEPTIC) mouth spray 1 spray  1 spray Mouth/Throat PRN Hessie Knows, MD      ? methocarbamol (ROBAXIN) tablet 500 mg  500 mg Oral Q6H PRN Hessie Knows, MD   500 mg at 03/21/21 2116  ? Or  ? methocarbamol (ROBAXIN) 500 mg in dextrose 5 % 50 mL IVPB  500 mg Intravenous Q6H PRN Hessie Knows, MD      ? metoCLOPramide (REGLAN) tablet 5-10 mg  5-10 mg Oral Q8H PRN Hessie Knows, MD      ? Or  ? metoCLOPramide (REGLAN) injection 5-10 mg  5-10 mg Intravenous Q8H PRN Hessie Knows, MD      ? morphine (PF) 2 MG/ML injection 0.5-1 mg  0.5-1 mg Intravenous Q2H PRN Hessie Knows, MD   1 mg at 03/21/21 1241  ? multivitamin with minerals tablet 1 tablet  1 tablet Oral Daily Hessie Knows, MD   1 tablet at 03/21/21 1124  ? ondansetron (ZOFRAN) tablet 4 mg  4 mg Oral Q6H PRN Hessie Knows, MD   4 mg at 03/21/21 2116  ? Or  ? ondansetron (ZOFRAN) injection 4 mg  4 mg Intravenous Q6H PRN Hessie Knows, MD   4 mg at 03/22/21 3016  ? pantoprazole (PROTONIX) EC tablet 40 mg  40 mg Oral Daily Hessie Knows, MD   40 mg at 03/21/21 1124  ? polyethylene glycol (MIRALAX / GLYCOLAX) packet 17 g  17 g Oral Daily PRN Hessie Knows, MD      ? rosuvastatin (CRESTOR) tablet 10 mg  10 mg Oral QHS Hessie Knows, MD   10 mg at 03/21/21 2116  ? scopolamine (TRANSDERM-SCOP) 1 MG/3DAYS 1.5 mg  1 patch Transdermal Q72H Hessie Knows, MD   1.5 mg at 03/21/21 1617  ? sodium phosphate (FLEET) 7-19 GM/118ML enema 1 enema  1 enema Rectal Once PRN Hessie Knows, MD      ? sodium zirconium cyclosilicate (LOKELMA) packet 10 g  10 g Oral Once Colon Flattery, NP      ? tamsulosin (FLOMAX) capsule 0.4 mg  0.4 mg Oral Daily Hessie Knows, MD  0.4 mg at 03/21/21 1124  ? torsemide (DEMADEX) tablet 40 mg  40 mg Oral Daily Hessie Knows, MD   40 mg at 03/21/21 1124  ? traMADol (ULTRAM) tablet 50 mg  50 mg Oral Q6H Hessie Knows, MD   50 mg at 03/22/21 6811  ? zolpidem (AMBIEN) tablet 5 mg  5 mg Oral QHS PRN Hessie Knows, MD      ?  ? ? ?Allergies: ?No Known Allergies  ? ? ?Past Medical History: ?Past Medical History:  ?Diagnosis Date  ? Aortic atherosclerosis ([place])   ? Arthritis   ? Atrial fibrillation and flutter ([place])   ? a.) CHA2DS2-VASc = 5 (age x 2, CHF, HTN, aortic plaque). b.) rate/rhythm maintain on oral carvedilol; chronically anticoagulated with dose reduced apixaban  ? BPH (benign prostatic hyperplasia)   ? Cardiac syncope   ? Cardiomyopathy ([place])   ? a.) TTE 04/23/2016: EF 40%. b.) TTE 09/03/2016: EF 50%. c.) TTE 05/04/2019: EF >55%.  ? CHF (congestive heart failure) ([place])   ? a.) TTE 04/23/2016: EF 40%; BAE; mild AR/TR, mod MR; G1DD. b.) TTE 09/03/2016: EF 50%; mild LVH; BAE; triv AR/PR, mod MR/TR. c.) TTE 05/04/2019: EF >55%; mod LVH, mild LA enlargement; mild AR/MR/TR  ? CKD (chronic kidney disease), stage IV ([place])   ? Complication of anesthesia   ? a.) h/o intra/postoperative A.fib  ? Coronary artery disease   ? Diverticulosis   ? Gout   ? History of 2019 novel coronavirus disease (COVID-19) 11/14/2020  ? History of colon polyps   ? Hypercholesteremia   ? Hypertension   ? Long term current use of anticoagulant   ? a.) dose reduced apixaban; dose reduction d/t to age and CKD Dx.  ? 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 a.) s/p resection  ? MGUS (monoclonal gammopathy of unknown significance)   ? Pleural effusion   ? Prostatism   ? Rectal bleeding   ? ? ? ?Past Surgical History: ?Past Surgical History:  ?Procedure Laterality Date  ? ANKLE SURGERY    ? following volleyball injury  ? APPENDECTOMY    ? COLONOSCOPY    ? COLONOSCOPY WITH PROPOFOL N/A 01/02/2017  ? Procedure: COLONOSCOPY WITH PROPOFOL;  Surgeon: Manya Silvas, MD;  Location:  [facility] ENDOSCOPY;  Service: Endoscopy;  Laterality: N/A;  ? JOINT REPLACEMENT    ? RIGHT TOTAL HIP  ? TONSILLECTOMY    ? TOTAL HIP ARTHROPLASTY  09/2009  ? Right femoral neck fracture  ? TOTAL HIP ARTHROPLASTY Left 03/06/2016  ? P

## 2021-03-22 NOTE — Plan of Care (Signed)
?  Problem: Education: ?Goal: Knowledge of General Education information will improve ?Description: Including pain rating scale, medication(s)/side effects and non-pharmacologic comfort measures ?03/22/2021 0418 by Rexford Maus, RN ?Outcome: Progressing ?03/22/2021 0418 by Rexford Maus, RN ?Outcome: Progressing ?  ?Problem: Health Behavior/Discharge Planning: ?Goal: Ability to manage health-related needs will improve ?03/22/2021 0418 by Rexford Maus, RN ?Outcome: Progressing ?03/22/2021 0418 by Rexford Maus, RN ?Outcome: Progressing ?  ?Problem: Clinical Measurements: ?Goal: Ability to maintain clinical measurements within normal limits will improve ?03/22/2021 0418 by Rexford Maus, RN ?Outcome: Progressing ?03/22/2021 0418 by Rexford Maus, RN ?Outcome: Progressing ?Goal: Will remain free from infection ?03/22/2021 0418 by Rexford Maus, RN ?Outcome: Progressing ?03/22/2021 0418 by Rexford Maus, RN ?Outcome: Progressing ?Goal: Diagnostic test results will improve ?03/22/2021 0418 by Rexford Maus, RN ?Outcome: Progressing ?03/22/2021 0418 by Rexford Maus, RN ?Outcome: Progressing ?Goal: Respiratory complications will improve ?03/22/2021 0418 by Rexford Maus, RN ?Outcome: Progressing ?03/22/2021 0418 by Rexford Maus, RN ?Outcome: Progressing ?Goal: Cardiovascular complication will be avoided ?Outcome: Progressing ?  ?Problem: Nutrition: ?Goal: Adequate nutrition will be maintained ?Outcome: Progressing ?  ?Problem: Coping: ?Goal: Level of anxiety will decrease ?Outcome: Progressing ?  ?Problem: Elimination: ?Goal: Will not experience complications related to bowel motility ?Outcome: Progressing ?Goal: Will not experience complications related to urinary retention ?Outcome: Progressing ?  ?Problem: Pain Managment: ?Goal: General experience of comfort will improve ?Outcome: Progressing ?  ?Problem: Safety: ?Goal: Ability to remain free from injury will improve ?Outcome: Progressing ?  ?Problem: Skin Integrity: ?Goal: Risk  for impaired skin integrity will decrease ?Outcome: Progressing ?  ?Problem: Education: ?Goal: Knowledge of the prescribed therapeutic regimen will improve ?Outcome: Progressing ?Goal: Individualized Educational Video(s) ?Outcome: Progressing ?  ?Problem: Activity: ?Goal: Ability to avoid complications of mobility impairment will improve ?Outcome: Progressing ?Goal: Range of joint motion will improve ?Outcome: Progressing ?  ?Problem: Clinical Measurements: ?Goal: Postoperative complications will be avoided or minimized ?Outcome: Progressing ?  ?Problem: Pain Management: ?Goal: Pain level will decrease with appropriate interventions ?Outcome: Progressing ?  ?Problem: Skin Integrity: ?Goal: Will show signs of wound healing ?Outcome: Progressing ?  ?

## 2021-03-22 NOTE — Discharge Summary (Addendum)
?Physician Discharge Summary  ?Patient ID: ?Lawrence Perkins. ?MRN: [id number] ?DOB/AGE: 82-Feb-1942 82 y.o. ? ?Admit date: 03/21/2021 ?Discharge date: March 29, 2021 ? ?Admission Diagnoses:  ?S/P TKR (total knee replacement) using cement, right [Z96.651] ? ? ?Discharge Diagnoses: ?Patient Active Problem List  ? Diagnosis Date Noted  ? S/P TKR (total knee replacement) using cement, right 03/21/2021  ? Syncope 02/17/2018  ? Atherosclerosis of abdominal aorta ([place]) 09/23/2017  ? Coronary artery disease involving native coronary artery of native heart 09/23/2017  ? Pleural effusion, not elsewhere classified 08/07/2017  ? Hx of adenomatous polyp of colon 10/01/2016  ? Bradycardia 06/20/2016  ? Chronic systolic CHF (congestive heart failure), NYHA class 2 ([place]) 04/11/2016  ? Benign hypertension with chronic kidney disease, stage III ([place]) 04/11/2016  ? Primary osteoarthritis of left hip 03/06/2016  ? MGUS (monoclonal gammopathy of unknown significance) 10/31/2015  ? Health care maintenance 11/15/2014  ? Chronic kidney disease, stage III (moderate) ([place]) 06/27/2013  ? Hyperlipidemia 06/27/2013  ? ATRIAL FIBRILLATION 02/14/2010  ? CEREBROVASCULAR ACCIDENT, HX OF 02/14/2010  ? ? ?Past Medical History:  ?Diagnosis Date  ? Aortic atherosclerosis ([place])   ? Arthritis   ? Atrial fibrillation and flutter ([place])   ? a.) CHA2DS2-VASc = 5 (age x 2, CHF, HTN, aortic plaque). b.) rate/rhythm maintain on oral carvedilol; chronically anticoagulated with dose reduced apixaban  ? BPH (benign prostatic hyperplasia)   ? Cardiac syncope   ? Cardiomyopathy ([place])   ? a.) TTE 04/23/2016: EF 40%. b.) TTE 09/03/2016: EF 50%. c.) TTE 05/04/2019: EF >55%.  ? CHF (congestive heart failure) ([place])   ? a.) TTE 04/23/2016: EF 40%; BAE; mild AR/TR, mod MR; G1DD. b.) TTE 09/03/2016: EF 50%; mild LVH; BAE; triv AR/PR, mod MR/TR. c.) TTE 05/04/2019: EF >55%; mod LVH, mild LA enlargement; mild AR/MR/TR  ? CKD (chronic kidney disease), stage IV ([place])   ?  Complication of anesthesia   ? a.) h/o intra/postoperative A.fib  ? Coronary artery disease   ? Diverticulosis   ? Gout   ? History of 2019 novel coronavirus disease (COVID-19) 11/14/2020  ? History of colon polyps   ? Hypercholesteremia   ? Hypertension   ? Long term current use of anticoagulant   ? a.) dose reduced apixaban; dose reduction d/t to age and CKD Dx.  ? 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 a.) s/p resection  ? MGUS (monoclonal gammopathy of unknown significance)   ? Pleural effusion   ? Prostatism   ? Rectal bleeding   ? ?  ?Transfusion: 2 units of PRBC ?  ?Consultants (if any): Treatment Team:  ?Algernon Huxley, MD ?Delana Meyer Dolores Lory, MD ? ?Discharged Condition: Improved ? ?Hospital Course: Lawrence Perkins. is an 81 y.o. male who was admitted 03/21/2021 with a diagnosis of S/P TKR (total knee replacement) using cement, right and went to the operating room on 03/21/2021 and underwent the above named procedures.  ?  ?Surgeries: Procedure(s): ?TOTAL KNEE ARTHROPLASTY on 03/21/2021 ?Patient tolerated the surgery well. Taken to PACU where she was stabilized and then transferred to the orthopedic floor. ? ?Started on Eliquis, SCDs. Heels elevated on bed with rolled towels. No evidence of DVT. Negative [place]. ?Physical therapy started on day #1 for gait training and transfer. OT started day #1 for ADL and assisted devices. ? ?Patient's foley was d/c on day #1.  On postop day 1, patient with acute postop blood loss anemia, patient started on iron supplement.  Potassium elevated at  5.4 and patient with acute renal insufficiency with underlying chronic kidney disease.  Nephrology was consulted and recommending holding his torsemide and increasing oral and starting IV fluid hydration.  Patient with improvement of potassium on postop day 2.  Also on postop day 2 hemoglobin down to 7.6. ?Postop day 3, the hemoglobin dropped down to 7.2.  The patient's potassium stabilized to 4.4.  The patient has had in and out  catheterization.  The patient is improving with physical therapy and ambulated 120 feet. Patient underwent transfusion with two units of PRBC, Patient had a perm cath placed and dialysis started on 3/28 and 3/29 due to worsening renal function. On post op day 9 patient was doing well with PT, VSS, labs stable. Patient was set up for outpatient dialysis and ready for dc to home with HHPT. ? ? ?He was given perioperative antibiotics:  ?Anti-infectives (From admission, onward)  ? ? Start     Dose/Rate Route Frequency Ordered Stop  ? 03/27/21 0851  ceFAZolin (ANCEF) IVPB 1 g/50 mL premix       ? 1 g ?100 mL/hr over 30 Minutes Intravenous 30 min pre-op 03/27/21 0852 03/27/21 0922  ? 03/27/21 0828  ceFAZolin (ANCEF) IVPB 2g/100 mL premix  Status:  Discontinued       ? 2 g ?200 mL/hr over 30 Minutes Intravenous 30 min pre-op 03/27/21 0828 03/27/21 0852  ? 03/21/21 1400  ceFAZolin (ANCEF) IVPB 2g/100 mL premix       ? 2 g ?200 mL/hr over 30 Minutes Intravenous Every 6 hours 03/21/21 1110 03/21/21 2030  ? 03/21/21 0624  ceFAZolin (ANCEF) 2-4 GM/100ML-% IVPB       ?Note to Pharmacy: Norton Blizzard A: cabinet override  ?    03/21/21 0624 03/21/21 0751  ? 03/21/21 0600  ceFAZolin (ANCEF) IVPB 2g/100 mL premix       ? 2 g ?200 mL/hr over 30 Minutes Intravenous On call to O.R. 03/21/21 0025 03/21/21 0745  ? ?  ?. ? ?He was given sequential compression devices, early ambulation, and Eliquis TEDs for DVT prophylaxis. ? ?He benefited maximally from the hospital stay and there were no complications.   ? ?Recent vital signs:  ?Vitals:  ? 03/29/21 1225 03/29/21 1532  ?BP: (!) 155/77 (!) 152/80  ?Pulse:  76  ?Resp:  18  ?Temp:  98.3 ?F (36.8 ?C)  ?SpO2:  97%  ? ? ?Recent laboratory studies:  ?Lab Results  ?Component Value Date  ? HGB 8.1 (L) 03/29/2021  ? HGB 8.4 (L) 03/28/2021  ? HGB 7.2 (L) 03/27/2021  ? ?Lab Results  ?Component Value Date  ? WBC 5.6 03/29/2021  ? PLT 161 03/29/2021  ? ?Lab Results  ?Component Value Date  ? INR  1.4 (H) 03/09/2021  ? ?Lab Results  ?Component Value Date  ? NA 133 (L) 03/29/2021  ? K 3.5 03/29/2021  ? CL 98 03/29/2021  ? CO2 29 03/29/2021  ? BUN 42 (H) 03/29/2021  ? CREATININE 3.78 (H) 03/29/2021  ? GLUCOSE 99 03/29/2021  ? ? ?Discharge Medications:   ?Allergies as of 03/29/2021   ?No Known Allergies ?  ? ?  ?Medication List  ?  ? ?STOP taking these medications   ? ?ibuprofen 200 MG tablet ?Commonly known as: ADVIL ?  ? ?  ? ?TAKE these medications   ? ?apixaban 2.5 MG Tabs tablet ?Commonly known as: ELIQUIS ?Take 2.5 mg by mouth 2 (two) times daily. ?  ?carvedilol 3.125 MG tablet ?Commonly known  as: COREG ?Take 3.125 mg by mouth 2 (two) times daily with a meal. ?  ?cholecalciferol 25 MCG (1000 UNIT) tablet ?Commonly known as: VITAMIN D3 ?Take 1,000 Units by mouth daily. ?  ?docusate sodium 100 MG capsule ?Commonly known as: COLACE ?Take 1 capsule (100 mg total) by mouth 2 (two) times daily. ?  ?HYDROcodone-acetaminophen 7.5-325 MG tablet ?Commonly known as: NORCO ?Take 1-2 tablets by mouth every 4 (four) hours as needed for severe pain (pain score 7-10). ?  ?hydrocortisone cream 1 % ?Apply 1 application topically 2 (two) times daily as needed for itching. ?  ?methocarbamol 500 MG tablet ?Commonly known as: ROBAXIN ?Take 1 tablet (500 mg total) by mouth every 6 (six) hours as needed for muscle spasms. ?  ?multivitamin with minerals Tabs tablet ?Take 1 tablet by mouth daily. ?  ?rosuvastatin 10 MG tablet ?Commonly known as: CRESTOR ?Take 10 mg by mouth at bedtime. ?  ?tamsulosin 0.4 MG Caps capsule ?Commonly known as: FLOMAX ?Take 1 capsule (0.4 mg total) by mouth daily. ?  ?torsemide 20 MG tablet ?Commonly known as: DEMADEX ?Take 2 tablets (40 mg total) by mouth daily. ?  ?traMADol 50 MG tablet ?Commonly known as: ULTRAM ?Take 1 tablet (50 mg total) by mouth every 6 (six) hours. ?  ? ?  ? ?  ?  ? ? ?  ?Durable Medical Equipment  ?(From admission, onward)  ?  ? ? ?  ? ?  Start     Ordered  ? 03/21/21 1111  DME  Walker rolling  Once       ?Question Answer Comment  ?Walker: With 5 Inch Wheels   ?Patient needs a walker to treat with the following condition S/P TKR (total knee replacement) using cement, right   ?  ? 03/21/21 1110  ?

## 2021-03-22 NOTE — Progress Notes (Addendum)
1415 ?Pt being transported down to [place] at this time  ? ?1632 ?Bladder scan showed 474m. Will inform physician  ? ?1633 ?Pt refuses bone foam but states he will tolerate leg being elevated with pillows/towels under ankle (not under knee) ? ?1705 ?Dr MRudene Christiansmade aware pt has voided little over 1041msince foley removal this morning. Bladder scan 44564mVerbal orders with readback to place new foley at this time. ? ?1739 ?16 FR Foley inserted at 1733 with Maddie G RN to witness. Pt tolerated no complication. Call bell and possessions within reach  ?

## 2021-03-22 NOTE — Progress Notes (Signed)
? ?Subjective: ?1 Day Post-Op Procedure(s) (LRB): ?TOTAL KNEE ARTHROPLASTY (Right) ?Patient reports pain as 1 on 0-10 scale.   ?Patient is well, but has had some minor complaints of Nausea ?Denies any CP, SOB, ABD pain. ?We will continue therapy today.  ?Plan is to go Home after hospital stay. ? ?Objective: ?Vital signs in last 24 hours: ?Temp:  [97.1 ?F (36.2 ?C)-97.7 ?F (36.5 ?C)] 97.7 ?F (36.5 ?C) (03/22 0540) ?Pulse Rate:  [50-127] 75 (03/22 0540) ?Resp:  [12-22] 16 (03/22 0540) ?BP: (106-181)/(67-91) 153/78 (03/22 0540) ?SpO2:  [96 %-100 %] 97 % (03/22 0540) ? ?Intake/Output from previous day: ?03/21 0701 - 03/22 0700 ?In: 2228.7 [P.O.:240; I.V.:1888.7; IV Piggyback:100] ?Out: 1895 [Urine:1475; Emesis/NG output:120; Blood:300] ?Intake/Output this shift: ?No intake/output data recorded. ? ?Recent Labs  ?  03/21/21 ?0639 03/22/21 ?0337  ?HGB 10.9* 8.7*  ? ?Recent Labs  ?  03/21/21 ?0639 03/22/21 ?0337  ?WBC  --  6.1  ?RBC  --  2.67*  ?HCT 32.0* 25.9*  ?PLT  --  185  ? ?Recent Labs  ?  03/21/21 ?0639 03/22/21 ?0337  ?NA 142 138  ?K 4.1 5.4*  ?CL 107 110  ?CO2  --  21*  ?BUN 56* 65*  ?CREATININE 4.90* 4.38*  ?GLUCOSE 78 135*  ?CALCIUM  --  8.0*  ? ?No results for input(s): LABPT, INR in the last 72 hours. ? ?EXAM ?General - Patient is Alert, Appropriate, and Oriented ?Extremity - Neurovascular intact ?Sensation intact distally ?Intact pulses distally ?Dorsiflexion/Plantar flexion intact ?No cellulitis present ?Compartment soft ?Dressing - dressing C/D/I and no drainage, provena intact with out drainage ?Motor Function - intact, moving foot and toes well on exam.  ? ?Past Medical History:  ?Diagnosis Date  ? Aortic atherosclerosis ([place])   ? Arthritis   ? Atrial fibrillation and flutter ([place])   ? a.) CHA2DS2-VASc = 5 (age x 2, CHF, HTN, aortic plaque). b.) rate/rhythm maintain on oral carvedilol; chronically anticoagulated with dose reduced apixaban  ? BPH (benign prostatic hyperplasia)   ? Cardiac syncope   ?  Cardiomyopathy ([place])   ? a.) TTE 04/23/2016: EF 40%. b.) TTE 09/03/2016: EF 50%. c.) TTE 05/04/2019: EF >55%.  ? CHF (congestive heart failure) ([place])   ? a.) TTE 04/23/2016: EF 40%; BAE; mild AR/TR, mod MR; G1DD. b.) TTE 09/03/2016: EF 50%; mild LVH; BAE; triv AR/PR, mod MR/TR. c.) TTE 05/04/2019: EF >55%; mod LVH, mild LA enlargement; mild AR/MR/TR  ? CKD (chronic kidney disease), stage IV ([place])   ? Complication of anesthesia   ? a.) h/o intra/postoperative A.fib  ? Coronary artery disease   ? Diverticulosis   ? Gout   ? History of 2019 novel coronavirus disease (COVID-19) 11/14/2020  ? History of colon polyps   ? Hypercholesteremia   ? Hypertension   ? Long term current use of anticoagulant   ? a.) dose reduced apixaban; dose reduction d/t to age and CKD Dx.  ? 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 a.) s/p resection  ? MGUS (monoclonal gammopathy of unknown significance)   ? Pleural effusion   ? Prostatism   ? Rectal bleeding   ? ? ?Assessment/Plan:   ?1 Day Post-Op Procedure(s) (LRB): ?TOTAL KNEE ARTHROPLASTY (Right) ?Principal Problem: ?  S/P TKR (total knee replacement) using cement, right ? ?Estimated body mass index is 27.4 kg/m? as calculated from the following: ?  Height as of this encounter: '5\' 11"'$  (1.803 m). ?  Weight as of this encounter: 89.1 kg. ?Advance  diet ?Up with therapy ?Pain controlled ?Mild nausea - continue with PRN nausea Meds ?Hyperkalemia with elevated Cr, will consult nephrology ?CM to assist with discharge to home with HHPT ? ? ?DVT Prophylaxis - TED hose and SCDs Eliquis ?Weight-Bearing as tolerated to right leg ? ? ?T. Rachelle Hora, PA-C ?[place] ?03/22/2021, 7:54 AM ?  ?

## 2021-03-22 NOTE — Evaluation (Signed)
Occupational Therapy Evaluation ?Patient Details ?Name: Lawrence Perkins. ?MRN: [id number] ?DOB: [birth year] ?Today's Date: 03/22/2021 ? ? ?History of Present Illness Pt is a 81 y.o. male s/p elective R TKA. PMH includes: CAD, cardiomyopathy, CHF, atrial fibrillation, aortic atherosclerosis, cardiac syncope, CKD-IV, HTN, HLD, OA, BPH.  ? ?Clinical Impression ?  ?Pt seen for OT evaluation this date, POD#1 from above surgery. Upon arrival to room, pt awake in bed, reporting 4/10 knee pain. Pt agreeable to OT eval/tx. Pt was independent in all ADLs prior to surgery. Pt is eager to return to PLOF with less pain and improved safety and independence. Pt currently requires MIN GUARD for stand pivot transfers to/from [facility] and MIN A for LB dressing while in seated position due to pain and limited AROM of R knee. Pt instructed in polar care mgt, falls prevention strategies, home/routines modifications, and DME/AE for LB bathing and dressing tasks; handout provided and pt & wife verbalized familiarity with education as pt assisted his wife following her TKA last year. Pt would benefit from skilled OT services including additional instruction in techniques, with or without assistive devices, for dressing and bathing skills to support recall and carryover prior to discharge and ultimately to maximize safety, independence, and minimize falls risk and caregiver burden. Upon discharge recommending [place] [facility].  ?   ? ?Recommendations for follow up therapy are one component of a multi-disciplinary discharge planning process, led by the attending physician.  Recommendations may be updated based on patient status, additional functional criteria and insurance authorization.  ? ?Follow Up Recommendations ? Home health OT  ?  ?Assistance Recommended at Discharge Intermittent Supervision/Assistance  ?Patient can return home with the following A little help with walking and/or transfers;A lot of help with bathing/dressing/bathroom;Assistance with  cooking/housework ? ?  ?Functional Status Assessment ? Patient has had a recent decline in their functional status and demonstrates the ability to make significant improvements in function in a reasonable and predictable amount of time.  ?Equipment Recommendations ? None recommended by OT (pt has all necessary DME)  ?  ?   ?Precautions / Restrictions Precautions ?Precautions: Fall;Knee ?Precaution Booklet Issued: Yes (comment) ?Restrictions ?Weight Bearing Restrictions: Yes ?RLE Weight Bearing: Weight bearing as tolerated  ? ?  ? ?Mobility Bed Mobility ?Overal bed mobility: Needs Assistance ?Bed Mobility: Supine to Sit, Sit to Supine ?  ?  ?Supine to sit: Min assist ?Sit to supine: Min assist ?  ?General bed mobility comments: Requires MIN A for managing RLE ?  ? ?Transfers ?Overall transfer level: Needs assistance ?Equipment used: Rolling walker (2 wheels) ?Transfers: Sit to/from Stand ?Sit to Stand: Min guard ?  ?  ?  ?  ?  ?General transfer comment: Requires verbal cues for safe hand placement ?  ? ?  ?Balance Overall balance assessment: Needs assistance ?Sitting-balance support: Bilateral upper extremity supported, Feet supported ?Sitting balance-Leahy Scale: Good ?Sitting balance - Comments: Good sitting balance reaching outside BOS ?  ?Standing balance support: Bilateral upper extremity supported, During functional activity, Reliant on assistive device for balance ?Standing balance-Leahy Scale: Fair ?Standing balance comment: Requires MIN GUARD for static standing balance with RW ?  ?  ?  ?  ?  ?  ?  ?  ?  ?  ?  ?   ? ?ADL either performed or assessed with clinical judgement  ? ?ADL Overall ADL's : Needs assistance/impaired ?  ?  ?  ?  ?  ?  ?  ?  ?  ?  ?  Lower Body Dressing: Minimal assistance;Sitting/lateral leans ?Lower Body Dressing Details (indicate cue type and reason): MIN A to don socks over toes. Able to doff with supervision ?Toilet Transfer: Min guard;Stand-pivot;BSC/3in1;Rolling walker (2  wheels) ?Toilet Transfer Details (indicate cue type and reason): Requires verbal cues for safe hand placement ?  ?  ?  ?  ?  ?   ? ? ? ?Vision Baseline Vision/History: 1 Wears glasses ?Ability to See in Adequate Light: 0 Adequate ?Patient Visual Report: No change from baseline ?   ?   ?   ?   ? ?Pertinent Vitals/Pain Pain Assessment ?Pain Assessment: 0-10 ?Pain Score: 4  ?Pain Location: R knee ?Pain Descriptors / Indicators: Discomfort, Grimacing, Guarding ?Pain Intervention(s): Limited activity within patient's tolerance, Monitored during session, Repositioned, Ice applied  ? ? ? ?   ?Extremity/Trunk Assessment Upper Extremity Assessment ?Upper Extremity Assessment: Overall WFL for tasks assessed ?  ?Lower Extremity Assessment ?Lower Extremity Assessment: Generalized weakness;RLE deficits/detail ?RLE Deficits / Details: R TKA ?  ?  ?  ?Communication Communication ?Communication: No difficulties ?  ?Cognition Arousal/Alertness: Awake/alert ?Behavior During Therapy: [facility] for tasks assessed/performed ?Overall Cognitive Status: Within Functional Limits for tasks assessed ?  ?  ?  ?  ?  ?  ?  ?  ?  ?  ?  ?  ?  ?  ?  ?  ?General Comments: Pt is A an O x 4. Has flat affect but is cooperative and pleasant ?  ?  ?   ?Exercises Other Exercises ?Other Exercises: Pt instructed in polar care mgt, falls prevention strategies, home/routines modifications, and DME/AE for LB bathing and dressing tasks; handout provided and pt & wife verbalized familiarity with education as pt assisted his wife following her TKA last year. ?  ?   ? ? ?Home Living Family/patient expects to be discharged to:: Private residence ?Living Arrangements: Spouse/significant other ?Available Help at Discharge: Family;Available 24 hours/day ?Type of Home: House ?Home Access: Stairs to enter ?Entrance Stairs-Number of Steps: 3-4 ?Entrance Stairs-Rails: Right;Left ?Home Layout: One level ?  ?  ?Bathroom Shower/Tub: Walk-in shower ?  ?Bathroom Toilet:  Standard ?Bathroom Accessibility: Yes ?  ?Home Equipment: Conservation officer, nature (2 wheels);Cane - single point;BSC/3in1;Shower seat - built in ?  ?  ?  ? ?  ?Prior Functioning/Environment Prior Level of Function : Independent/Modified Independent ?  ?  ?  ?  ?  ?  ?  ?ADLs Comments: independent with all ADLs/IADLs ?  ? ?  ?  ?OT Problem List: Decreased range of motion;Decreased activity tolerance;Impaired balance (sitting and/or standing);Decreased knowledge of use of DME or AE;Pain ?  ?   ?OT Treatment/Interventions: Self-care/ADL training;Therapeutic exercise;DME and/or AE instruction;Therapeutic activities;Patient/family education;Balance training  ?  ?OT Goals(Current goals can be found in the care plan section) Acute Rehab OT Goals ?Patient Stated Goal: to regain independence ?OT Goal Formulation: With patient ?Time For Goal Achievement: 04/05/21 ?Potential to Achieve Goals: Good ?ADL Goals ?Pt Will Perform Grooming: with supervision;standing ?Pt Will Perform Lower Body Dressing: with supervision;with adaptive equipment;sit to/from stand ?Pt Will Transfer to Toilet: with supervision;ambulating;bedside commode  ?OT Frequency: Min 2X/week ?  ? ?   ?AM-PAC OT "6 Clicks" Daily Activity     ?Outcome Measure Help from another person eating meals?: None ?Help from another person taking care of personal grooming?: A Little ?Help from another person toileting, which includes using toliet, bedpan, or urinal?: A Little ?Help from another person bathing (including washing, rinsing, drying)?: A Lot ?Help  from another person to put on and taking off regular upper body clothing?: None ?Help from another person to put on and taking off regular lower body clothing?: A Little ?6 Click Score: 19 ?  ?End of Session Equipment Utilized During Treatment: Rolling walker (2 wheels) ?Nurse Communication: Mobility status ? ?Activity Tolerance: Patient tolerated treatment well ?Patient left: in bed;with call bell/phone within reach;with bed  alarm set;with family/visitor present ? ?OT Visit Diagnosis: Pain;Other abnormalities of gait and mobility (R26.89) ?Pain - Right/Left: Right ?Pain - part of body: Knee  ?              ?Time: 2993-7169 ?OT Time Calculation (min

## 2021-03-22 NOTE — Progress Notes (Signed)
Physical Therapy Treatment ?Patient Details ?Name: Lawrence Perkins. ?MRN: [id number] ?DOB: 04/02/1940 ?Today's Date: 03/22/2021 ? ? ?History of Present Illness Pt is a 81 y.o. male s/p elective R TKA. PMH includes: CAD, cardiomyopathy, CHF, atrial fibrillation, aortic atherosclerosis, cardiac syncope, CKD-IV, HTN, HLD, OA, BPH. ? ?  ?PT Comments  ? ? Pt was long sitting in bed. He is A and O x 4 and cooperative throughout. Reports he was able to eat a little breakfast and has not had any vomiting since. Pt does endorse pain however pain did not limit session progression. Pt required increased time and min assist to exit bed. Spouse arrived and was present throughout remainder of session. Pt is planning to have his daughter come stay in addition to his wife helping. Pt stood CGA for safety and ambulated a short distance ( ~ 72f) prior to c/o dizziness and requesting to return to chair. Pt's BP was elevated at 182/96 with sao2 100% and HR in 90s. Author will return later this date and continue to progress pt to maximal independence. Author does feel pt will progress quickly once medical issues resolve. Pt will have 24/7 assistance at DC. No equipment needs met.  ?  ?Recommendations for follow up therapy are one component of a multi-disciplinary discharge planning process, led by the attending physician.  Recommendations may be updated based on patient status, additional functional criteria and insurance authorization. ? ?Follow Up Recommendations ? Home health PT ?  ?  ?Assistance Recommended at Discharge Intermittent Supervision/Assistance  ?Patient can return home with the following A little help with walking and/or transfers;Assistance with cooking/housework;Assist for transportation;Help with stairs or ramp for entrance ?  ?Equipment Recommendations ? None recommended by PT  ?  ?   ?Precautions / Restrictions Precautions ?Precautions: Fall;Knee ?Precaution Booklet Issued: Yes (comment) ?Restrictions ?Weight  Bearing Restrictions: Yes ?RLE Weight Bearing: Weight bearing as tolerated  ?  ? ?Mobility ? Bed Mobility ?Overal bed mobility: Needs Assistance ?Bed Mobility: Supine to Sit ?  ?Supine to sit: Min assist, HOB elevated ?  ?General bed mobility comments: pt required min assist to progress to EOB from long sitting. Increased time to perform with min assist to full achieve EOB short sit. Spouse was present and will be available to assist at home. Also will have a daughter coming to assist as well. ?  ? ?Transfers ?Overall transfer level: Needs assistance ?Equipment used: Rolling walker (2 wheels) ?Transfers: Sit to/from Stand ?Sit to Stand: Min guard ?  ?   ?General transfer comment: CGA for safety. Was able to stand EOB and from recliner with CGA for safety. Vcs for handplacement and increased fwd wt shift. ?  ? ?Ambulation/Gait ?Ambulation/Gait assistance: Supervision ?Gait Distance (Feet): 15 Feet ?Assistive device: Rolling walker (2 wheels) ?Gait Pattern/deviations: Step-to pattern ?Gait velocity: decreased ?  ?  ?General Gait Details: Pt was able to ambulate ~ 15 ft with RW. Distance limited by pt c/o dizziness. no LOB. BP 182/96. ? ? ?  ?Balance Overall balance assessment: Needs assistance ?Sitting-balance support: Bilateral upper extremity supported, Feet supported ?Sitting balance-Leahy Scale: Good ?  ?  ?Standing balance support: Bilateral upper extremity supported, During functional activity, Reliant on assistive device for balance ?Standing balance-Leahy Scale: Fair ?Standing balance comment: reliant on RW ?  ?  ?  ?Cognition Arousal/Alertness: Awake/alert ?Behavior During Therapy: [facility] assessed/performed ?Overall Cognitive Status: Within Functional Limits for tasks assessed ?  ?   ?General Comments: Pt is A an O x  4. Has flat affect but is cooperative and pleasant ?  ?  ? ?  ?   ?   ? ?Pertinent Vitals/Pain Pain Assessment ?Pain Assessment: 0-10 ?Pain Score: 6  ?Pain Location: R knee ?Pain  Descriptors / Indicators: Discomfort, Grimacing, Guarding ?Pain Intervention(s): Limited activity within patient's tolerance, Monitored during session, Premedicated before session, Repositioned, Ice applied  ? ? ? ?PT Goals (current goals can now be found in the care plan section) Acute Rehab PT Goals ?Patient Stated Goal: go home when I'm ready ?Progress towards PT goals: Progressing toward goals ? ?  ?Frequency ? ? ? BID ? ? ? ?  ?PT Plan Current plan remains appropriate  ? ? ?   ?AM-PAC PT "6 Clicks" Mobility   ?Outcome Measure ? Help needed turning from your back to your side while in a flat bed without using bedrails?: A Little ?Help needed moving from lying on your back to sitting on the side of a flat bed without using bedrails?: A Little ?Help needed moving to and from a bed to a chair (including a wheelchair)?: A Little ?Help needed standing up from a chair using your arms (e.g., wheelchair or bedside chair)?: A Little ?Help needed to walk in hospital room?: A Little ?Help needed climbing 3-5 steps with a railing? : A Lot ?6 Click Score: 17 ? ?  ?End of Session   ?Activity Tolerance: Patient tolerated treatment well;Treatment limited secondary to medical complications (Comment) (limited by dizziness) ?Patient left: in chair;with call bell/phone within reach;with chair alarm set;with family/visitor present (spouse present) ?Nurse Communication: Mobility status ?PT Visit Diagnosis: Other abnormalities of gait and mobility (R26.89);Muscle weakness (generalized) (M62.81);Difficulty in walking, not elsewhere classified (R26.2);Pain ?Pain - Right/Left: Right ?Pain - part of body: Knee ?  ? ? ?Time: 3013-1438 ?PT Time Calculation (min) (ACUTE ONLY): 20 min ? ?Charges:  $Therapeutic Activity: 8-22 mins          ?          ? ?Julaine Fusi PTA ?03/22/21, 10:15 AM  ? ?

## 2021-03-23 DIAGNOSIS — R0602 Shortness of breath: Secondary | ICD-10-CM | POA: Diagnosis not present

## 2021-03-23 DIAGNOSIS — I5022 Chronic systolic (congestive) heart failure: Secondary | ICD-10-CM | POA: Diagnosis not present

## 2021-03-23 DIAGNOSIS — N2581 Secondary hyperparathyroidism of renal origin: Secondary | ICD-10-CM | POA: Diagnosis not present

## 2021-03-23 DIAGNOSIS — I700 Atherosclerosis of aorta: Secondary | ICD-10-CM | POA: Diagnosis not present

## 2021-03-23 DIAGNOSIS — Z85820 Personal history of malignant melanoma of skin: Secondary | ICD-10-CM | POA: Diagnosis not present

## 2021-03-23 DIAGNOSIS — E7800 Pure hypercholesterolemia, unspecified: Secondary | ICD-10-CM | POA: Diagnosis not present

## 2021-03-23 DIAGNOSIS — N179 Acute kidney failure, unspecified: Secondary | ICD-10-CM | POA: Diagnosis not present

## 2021-03-23 DIAGNOSIS — N184 Chronic kidney disease, stage 4 (severe): Secondary | ICD-10-CM | POA: Diagnosis not present

## 2021-03-23 DIAGNOSIS — Z96643 Presence of artificial hip joint, bilateral: Secondary | ICD-10-CM | POA: Diagnosis not present

## 2021-03-23 DIAGNOSIS — Z823 Family history of stroke: Secondary | ICD-10-CM | POA: Diagnosis not present

## 2021-03-23 DIAGNOSIS — Z79899 Other long term (current) drug therapy: Secondary | ICD-10-CM | POA: Diagnosis not present

## 2021-03-23 DIAGNOSIS — R338 Other retention of urine: Secondary | ICD-10-CM | POA: Diagnosis not present

## 2021-03-23 DIAGNOSIS — R339 Retention of urine, unspecified: Secondary | ICD-10-CM | POA: Diagnosis not present

## 2021-03-23 DIAGNOSIS — M1711 Unilateral primary osteoarthritis, right knee: Secondary | ICD-10-CM | POA: Diagnosis not present

## 2021-03-23 DIAGNOSIS — Z801 Family history of malignant neoplasm of trachea, bronchus and lung: Secondary | ICD-10-CM | POA: Diagnosis not present

## 2021-03-23 DIAGNOSIS — I10 Essential (primary) hypertension: Secondary | ICD-10-CM | POA: Diagnosis not present

## 2021-03-23 DIAGNOSIS — Z811 Family history of alcohol abuse and dependence: Secondary | ICD-10-CM | POA: Diagnosis not present

## 2021-03-23 DIAGNOSIS — J811 Chronic pulmonary edema: Secondary | ICD-10-CM | POA: Diagnosis not present

## 2021-03-23 DIAGNOSIS — D62 Acute posthemorrhagic anemia: Secondary | ICD-10-CM | POA: Diagnosis not present

## 2021-03-23 DIAGNOSIS — I480 Paroxysmal atrial fibrillation: Secondary | ICD-10-CM | POA: Diagnosis not present

## 2021-03-23 DIAGNOSIS — I132 Hypertensive heart and chronic kidney disease with heart failure and with stage 5 chronic kidney disease, or end stage renal disease: Secondary | ICD-10-CM | POA: Diagnosis not present

## 2021-03-23 DIAGNOSIS — J90 Pleural effusion, not elsewhere classified: Secondary | ICD-10-CM | POA: Diagnosis not present

## 2021-03-23 DIAGNOSIS — N401 Enlarged prostate with lower urinary tract symptoms: Secondary | ICD-10-CM | POA: Diagnosis not present

## 2021-03-23 DIAGNOSIS — D631 Anemia in chronic kidney disease: Secondary | ICD-10-CM | POA: Diagnosis not present

## 2021-03-23 DIAGNOSIS — E871 Hypo-osmolality and hyponatremia: Secondary | ICD-10-CM | POA: Diagnosis not present

## 2021-03-23 DIAGNOSIS — Z7901 Long term (current) use of anticoagulants: Secondary | ICD-10-CM | POA: Diagnosis not present

## 2021-03-23 DIAGNOSIS — N186 End stage renal disease: Secondary | ICD-10-CM | POA: Diagnosis not present

## 2021-03-23 DIAGNOSIS — I2510 Atherosclerotic heart disease of native coronary artery without angina pectoris: Secondary | ICD-10-CM | POA: Diagnosis not present

## 2021-03-23 DIAGNOSIS — I429 Cardiomyopathy, unspecified: Secondary | ICD-10-CM | POA: Diagnosis not present

## 2021-03-23 DIAGNOSIS — E875 Hyperkalemia: Secondary | ICD-10-CM | POA: Diagnosis not present

## 2021-03-23 LAB — CBC
HCT: 22.9 % — ABNORMAL LOW (ref 39.0–52.0)
Hemoglobin: 7.6 g/dL — ABNORMAL LOW (ref 13.0–17.0)
MCH: 32.8 pg (ref 26.0–34.0)
MCHC: 33.2 g/dL (ref 30.0–36.0)
MCV: 98.7 fL (ref 80.0–100.0)
Platelets: 172 10*3/uL (ref 150–400)
RBC: 2.32 MIL/uL — ABNORMAL LOW (ref 4.22–5.81)
RDW: 12.8 % (ref 11.5–15.5)
WBC: 6.5 10*3/uL (ref 4.0–10.5)
nRBC: 0 % (ref 0.0–0.2)

## 2021-03-23 LAB — BASIC METABOLIC PANEL
Anion gap: 8 (ref 5–15)
BUN: 67 mg/dL — ABNORMAL HIGH (ref 8–23)
CO2: 21 mmol/L — ABNORMAL LOW (ref 22–32)
Calcium: 8 mg/dL — ABNORMAL LOW (ref 8.9–10.3)
Chloride: 105 mmol/L (ref 98–111)
Creatinine, Ser: 4.89 mg/dL — ABNORMAL HIGH (ref 0.61–1.24)
GFR, Estimated: 11 mL/min — ABNORMAL LOW (ref >60)
Glucose, Bld: 104 mg/dL — ABNORMAL HIGH (ref 70–99)
Potassium: 4.8 mmol/L (ref 3.5–5.1)
Sodium: 134 mmol/L — ABNORMAL LOW (ref 135–145)

## 2021-03-23 NOTE — Progress Notes (Signed)
Physical Therapy Treatment ?Patient Details ?Name: Lawrence Perkins. ?MRN: [id number] ?DOB: [birth year] ?Today's Date: 03/23/2021 ? ? ?History of Present Illness Pt is a 81 y.o. male s/p elective R TKA. PMH includes: CAD, cardiomyopathy, CHF, atrial fibrillation, aortic atherosclerosis, cardiac syncope, CKD-IV, HTN, HLD, OA, BPH. ? ?  ?PT Comments  ? ? Pt was sitting in recliner upon arriving with supportive spouse and daughter present. " I feel much better." Pt endorse 7/10 pain at rest however reports 9/10 pain in wt bearing. Pain did not limit session progression. He was able to stand without physical assistance. Ambulate 150 ft with RW without LOB however several standing rest due to fatigue. Sao2 stable throughout on rm air. H safely demonstrated ability to ascend/descend 4 stair with 1 rail only. Pt is progressing well from an acute PT standpoint. He demonstrated AROM R knee flex to 88 degrees. Author will assistance and focus next session with improving ROM/strength. Overall pt is cleared to DC from an acute PT standpoint however per PA, pt not medically stable. Acute PT will continue to follow and progress per current POC.  ?   ?Recommendations for follow up therapy are one component of a multi-disciplinary discharge planning process, led by the attending physician.  Recommendations may be updated based on patient status, additional functional criteria and insurance authorization. ? ?Follow Up Recommendations ? Home health PT ?  ?  ?Assistance Recommended at Discharge Intermittent Supervision/Assistance  ?Patient can return home with the following A little help with walking and/or transfers;Assistance with cooking/housework;Assist for transportation;Help with stairs or ramp for entrance ?  ?Equipment Recommendations ? None recommended by PT  ?  ?   ?Precautions / Restrictions Precautions ?Precautions: Fall;Knee ?Precaution Booklet Issued: Yes (comment) ?Restrictions ?Weight Bearing Restrictions: Yes ?RLE  Weight Bearing: Weight bearing as tolerated  ?  ? ?Mobility ? Bed Mobility ?Overal bed mobility: Needs Assistance ?Bed Mobility: Sit to Supine ?  ?  ?  ?Sit to supine: Min assist ?  ?General bed mobility comments: in recliner upon arriving. Did require min assist to progress BLEs into bed rom EOB short sit. ?  ? ?Transfers ?Overall transfer level: Needs assistance ?Equipment used: Rolling walker (2 wheels) ?Transfers: Sit to/from Stand ?Sit to Stand: Supervision ?  ?  ?  ?  ?  ?General transfer comment: no physical assistance to stand EOB, from recliner, and 2 x from mat table in rehab gym. ?  ? ?Ambulation/Gait ?Ambulation/Gait assistance: Supervision ?Gait Distance (Feet): 150 Feet ?Assistive device: Rolling walker (2 wheels) ?Gait Pattern/deviations: Step-through pattern, Antalgic, Trunk flexed ?Gait velocity: decreased ?  ?  ?General Gait Details: pt was able to ambulate to/from rehab gym with RW. Several standing rest breaks due to fatigue. ? ? ?Stairs ?Stairs: Yes ?Stairs assistance: Supervision ?Stair Management: One rail Right, Sideways, Step to pattern ?Number of Stairs: 4 ?General stair comments: pt was able to safely demonstrate ability to ascend/descend 4 stair with R rail only. Author demonstrated to pt's daughter but pt was abl to perform with supervision only. ? ? ?  ?Balance Overall balance assessment: Needs assistance ?Sitting-balance support: Feet supported ?Sitting balance-Leahy Scale: Good ?  ?  ?Standing balance support: Bilateral upper extremity supported, During functional activity, Reliant on assistive device for balance ?Standing balance-Leahy Scale: Good ?  ?   ?Cognition Arousal/Alertness: Awake/alert ?Behavior During Therapy: [facility] for tasks assessed/performed ?Overall Cognitive Status: Within Functional Limits for tasks assessed ?  ?   ?General Comments: Pt is A an O x  4. Has flat affect but is cooperative and pleasant ?  ?  ? ?  ?Exercises Total Joint Exercises ?Goniometric ROM: 88  degrees ? ?  ?General Comments General comments (skin integrity, edema, etc.): will focus on strengthening and ROM in afternoon PM session. ?  ?  ? ?Pertinent Vitals/Pain Pain Assessment ?Pain Assessment: 0-10 ?Pain Score: 7  ?Pain Location: R knee ?Pain Descriptors / Indicators: Discomfort, Grimacing, Guarding ?Pain Intervention(s): Limited activity within patient's tolerance, Monitored during session, Premedicated before session, Repositioned, Ice applied  ? ? ? ?PT Goals (current goals can now be found in the care plan section) Acute Rehab PT Goals ?Patient Stated Goal: go home when I'm ready ?Progress towards PT goals: Progressing toward goals ? ?  ?Frequency ? ? ? BID ? ? ? ?  ?PT Plan Current plan remains appropriate  ? ? ?   ?AM-PAC PT "6 Clicks" Mobility   ?Outcome Measure ? Help needed turning from your back to your side while in a flat bed without using bedrails?: A Little ?Help needed moving from lying on your back to sitting on the side of a flat bed without using bedrails?: A Little ?Help needed moving to and from a bed to a chair (including a wheelchair)?: A Little ?Help needed standing up from a chair using your arms (e.g., wheelchair or bedside chair)?: A Little ?Help needed to walk in hospital room?: A Little ?Help needed climbing 3-5 steps with a railing? : A Little ?6 Click Score: 18 ? ?  ?End of Session   ?Activity Tolerance: Patient tolerated treatment well ?Patient left: in bed;with call bell/phone within reach;with bed alarm set ?Nurse Communication: Mobility status ?PT Visit Diagnosis: Other abnormalities of gait and mobility (R26.89);Muscle weakness (generalized) (M62.81);Difficulty in walking, not elsewhere classified (R26.2);Pain ?Pain - Right/Left: Right ?Pain - part of body: Knee ?  ? ? ?Time: 1016-1040 ?PT Time Calculation (min) (ACUTE ONLY): 24 min ? ?Charges:  $Gait Training: 8-22 mins ?$Therapeutic Activity: 8-22 mins          ?          ? ?Julaine Fusi PTA ?03/23/21, 11:06 AM   ? ?

## 2021-03-23 NOTE — Progress Notes (Signed)
? ?Subjective: ?2 Days Post-Op Procedure(s) (LRB): ?TOTAL KNEE ARTHROPLASTY (Right) ?Patient reports pain as 4 on 0-10 scale.   ?Patient is doing well with no complaints.  ?Denies any CP, SOB, ABD pain. ?We will continue therapy today.  ?Plan is to go Home after hospital stay. ? ?Objective: ?Vital signs in last 24 hours: ?Temp:  [97.6 ?F (36.4 ?C)-98.6 ?F (37 ?C)] 97.8 ?F (36.6 ?C) (03/23 5809) ?Pulse Rate:  [52-84] 52 (03/23 0748) ?Resp:  [16-20] 16 (03/23 0748) ?BP: (133-185)/(68-106) 133/68 (03/23 0748) ?SpO2:  [90 %-100 %] 100 % (03/23 0448) ? ?Intake/Output from previous day: ?03/22 0701 - 03/23 0700 ?In: 120 [P.O.:120] ?Out: 800 [Urine:800] ?Intake/Output this shift: ?Total I/O ?In: 120 [P.O.:120] ?Out: 400 [Urine:400] ? ?Recent Labs  ?  03/21/21 ?0639 03/22/21 ?9833 03/23/21 ?0320  ?HGB 10.9* 8.7* 7.6*  ? ?Recent Labs  ?  03/22/21 ?8250 03/23/21 ?0320  ?WBC 6.1 6.5  ?RBC 2.67* 2.32*  ?HCT 25.9* 22.9*  ?PLT 185 172  ? ?Recent Labs  ?  03/22/21 ?5397 03/23/21 ?0320  ?NA 138 134*  ?K 5.4* 4.8  ?CL 110 105  ?CO2 21* 21*  ?BUN 65* 67*  ?CREATININE 4.38* 4.89*  ?GLUCOSE 135* 104*  ?CALCIUM 8.0* 8.0*  ? ?No results for input(s): LABPT, INR in the last 72 hours. ? ?EXAM ?General - Patient is Alert, Appropriate, and Oriented ?Extremity - Neurovascular intact ?Sensation intact distally ?Intact pulses distally ?Dorsiflexion/Plantar flexion intact ?No cellulitis present ?Compartment soft ?Dressing - dressing C/D/I and no drainage, provena intact with out drainage ?Motor Function - intact, moving foot and toes well on exam.  ? ?Past Medical History:  ?Diagnosis Date  ? Aortic atherosclerosis ([place])   ? Arthritis   ? Atrial fibrillation and flutter ([place])   ? a.) CHA2DS2-VASc = 5 (age x 2, CHF, HTN, aortic plaque). b.) rate/rhythm maintain on oral carvedilol; chronically anticoagulated with dose reduced apixaban  ? BPH (benign prostatic hyperplasia)   ? Cardiac syncope   ? Cardiomyopathy ([place])   ? a.) TTE 04/23/2016: EF  40%. b.) TTE 09/03/2016: EF 50%. c.) TTE 05/04/2019: EF >55%.  ? CHF (congestive heart failure) ([place])   ? a.) TTE 04/23/2016: EF 40%; BAE; mild AR/TR, mod MR; G1DD. b.) TTE 09/03/2016: EF 50%; mild LVH; BAE; triv AR/PR, mod MR/TR. c.) TTE 05/04/2019: EF >55%; mod LVH, mild LA enlargement; mild AR/MR/TR  ? CKD (chronic kidney disease), stage IV ([place])   ? Complication of anesthesia   ? a.) h/o intra/postoperative A.fib  ? Coronary artery disease   ? Diverticulosis   ? Gout   ? History of 2019 novel coronavirus disease (COVID-19) 11/14/2020  ? History of colon polyps   ? Hypercholesteremia   ? Hypertension   ? Long term current use of anticoagulant   ? a.) dose reduced apixaban; dose reduction d/t to age and CKD Dx.  ? 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 a.) s/p resection  ? MGUS (monoclonal gammopathy of unknown significance)   ? Pleural effusion   ? Prostatism   ? Rectal bleeding   ? ? ?Assessment/Plan:   ?2 Days Post-Op Procedure(s) (LRB): ?TOTAL KNEE ARTHROPLASTY (Right) ?Principal Problem: ?  S/P TKR (total knee replacement) using cement, right ? ?Estimated body mass index is 27.4 kg/m? as calculated from the following: ?  Height as of this encounter: '5\' 11"'$  (1.803 m). ?  Weight as of this encounter: 89.1 kg. ?Advance diet ?Up with therapy ?Pain controlled ?Nausea resolved ?Hyperkalemia - Resolved ?Acute  renal insufficiency - Appreciate Nephrology input ?Acute post op blood loss anemia - Continue with Iron supplement ?Recheck labs in the am   ?CM to assist with discharge to home with HHPT, patient making good progress with PT ? ? ?DVT Prophylaxis - TED hose and SCDs Eliquis ?Weight-Bearing as tolerated to right leg ? ? ?T. Rachelle Hora, PA-C ?[place] ?03/23/2021, 10:59 AM ?  ?

## 2021-03-23 NOTE — Progress Notes (Signed)
Occupational Therapy Treatment ?Patient Details ?Name: Lawrence Perkins. ?MRN: [id number] ?DOB: June 08, 1940 ?Today's Date: 03/23/2021 ? ? ?History of present illness Pt is a 81 y.o. male s/p elective R TKA. PMH includes: CAD, cardiomyopathy, CHF, atrial fibrillation, aortic atherosclerosis, cardiac syncope, CKD-IV, HTN, HLD, OA, BPH. ?  ?OT comments ? Pt. education was provided about OT services, as well as A/E for LE ADLs. Pt./family declined need for additional self-care A/E training at this time. Pt.'s wife reports that they learned a lot following her knee surgery last year, and anticipates they will have everything in place the pt. needs at home including the appropriate amount of assist for ADLs, and meals. No further OT services are warranted at this time at this level of care, all needs can be met at the next venue of care.  Will complete the order.    ? ?Recommendations for follow up therapy are one component of a multi-disciplinary discharge planning process, led by the attending physician.  Recommendations may be updated based on patient status, additional functional criteria and insurance authorization. ?   ?Follow Up Recommendations ? Home health OT  ?  ?Assistance Recommended at Discharge    ?Patient can return home with the following ? A little help with walking and/or transfers;A lot of help with bathing/dressing/bathroom;Assistance with cooking/housework ?  ?Equipment Recommendations ? None recommended by OT  ?  ?Recommendations for Other Services   ? ?  ?Precautions / Restrictions Precautions ?Precautions: Fall;Knee ?Precaution Booklet Issued: Yes (comment) ?Restrictions ?Weight Bearing Restrictions: Yes ?RLE Weight Bearing: Weight bearing as tolerated  ? ? ?  ? ?Mobility Bed Mobility ?  ?  ? Deferred ?  ?  ?  ?  ?  ?  ? ?Transfers ?  ? Deferred ?  ?  ?  ?  ?  ?  ?  ?  ?  ?  ?Balance   ?  ?  ?  ?  ?  ?  ?  ?  ?  ?  ?  ?  ?  ?  ?  ?  ?  ?  ?   ? ?ADL either performed or assessed with clinical  judgement  ? ?ADL   ?  ?  ?  ?  ?  ?  ?  ?  ?  ?  ?  ?  ?  ?  ?  ?  ?  ?  ?  ?  ?  ? ?Extremity/Trunk Assessment Upper Extremity Assessment ?Upper Extremity Assessment: Overall WFL for tasks assessed ?  ?  ?  ?  ?  ? ?Vision Patient Visual Report: No change from baseline ?  ?  ?Perception   ?  ?Praxis   ?  ? ?Cognition Arousal/Alertness: Awake/alert ?Behavior During Therapy: [facility] for tasks assessed/performed ?Overall Cognitive Status: Within Functional Limits for tasks assessed ?  ?  ?  ?  ?  ?  ?  ?  ?  ?  ?  ?  ?  ?  ?  ?  ?  ?  ?  ?   ?Exercises   ? ?  ?Shoulder Instructions   ? ? ?  ?General Comments    ? ? ?Pertinent Vitals/ Pain       Pain Assessment ?Pain Assessment: No/denies pain ? ?Home Living   ?  ?  ?  ?  ?  ?  ?  ?  ?  ?  ?  ?  ?  ?  ?  ?  ?  ?  ? ?  ?  Prior Functioning/Environment    ?  ?  ?  ?   ? ?Frequency ? Min 2X/week  ? ? ? ? ?  ?Progress Toward Goals ? ?OT Goals(current goals can now be found in the care plan section) ?   ? ?Acute Rehab OT Goals ?Patient Stated Goal: To return home ?OT Goal Formulation: With patient ?Time For Goal Achievement: 04/05/21 ?Potential to Achieve Goals: Good  ?Plan Discharge plan remains appropriate   ? ?Co-evaluation ? ? ?   ?  ?  ?  ?  ? ?  ?AM-PAC OT "6 Clicks" Daily Activity     ?Outcome Measure ? ? Help from another person eating meals?: None ?Help from another person taking care of personal grooming?: A Little ?Help from another person toileting, which includes using toliet, bedpan, or urinal?: A Little ?Help from another person bathing (including washing, rinsing, drying)?: A Lot ?Help from another person to put on and taking off regular upper body clothing?: None ?Help from another person to put on and taking off regular lower body clothing?: A Little ?6 Click Score: 19 ? ?  ?End of Session Equipment Utilized During Treatment: Rolling walker (2 wheels) ? ?  ?  ?Activity Tolerance Patient tolerated treatment well ?  ?Patient Left in bed;with call bell/phone  within reach;with bed alarm set;with family/visitor present ?  ?Nurse Communication   ?  ? ?   ? ?Time: 6349-4944 ?OT Time Calculation (min): 8 min ? ?Charges: OT General Charges ?$OT Visit: 1 Visit ?OT Treatments ?$Self Care/Home Management : 8-22 mins ? ?Harrel Carina, MS, OTR/L  ? ?Harrel Carina ?03/23/2021, 5:44 PM ?

## 2021-03-23 NOTE — Plan of Care (Signed)

## 2021-03-23 NOTE — Progress Notes (Signed)
Physical Therapy Treatment ?Patient Details ?Name: Lawrence Perkins. ?MRN: [id number] ?DOB: 1940-04-26 ?Today's Date: 03/23/2021 ? ? ?History of Present Illness Pt is a 81 y.o. male s/p elective R TKA. PMH includes: CAD, cardiomyopathy, CHF, atrial fibrillation, aortic atherosclerosis, cardiac syncope, CKD-IV, HTN, HLD, OA, BPH. ? ?  ?PT Comments  ? ? Pt was long sitting in bed with supportive spouse and daughter at bedside. He agrees to session and is cooperative however severely limited by pain. Attempted OOB but was in too much pain. Elected to only perform there ex and ROM exercises. RN made aware of pain complaints and request for pain medicine. See exercises listed below. He is doing well overall and was able to ambulate and perform stairs in AM session. Will progress OOB activity in AM session. He has been cleared from an acute PT standpoint for DC home with HHPT to follow.  ?  ?Recommendations for follow up therapy are one component of a multi-disciplinary discharge planning process, led by the attending physician.  Recommendations may be updated based on patient status, additional functional criteria and insurance authorization. ? ?Follow Up Recommendations ? Home health PT ?  ?  ?Assistance Recommended at Discharge Intermittent Supervision/Assistance  ?Patient can return home with the following A little help with walking and/or transfers;Assistance with cooking/housework;Assist for transportation;Help with stairs or ramp for entrance ?  ?Equipment Recommendations ? None recommended by PT  ?  ?   ?Precautions / Restrictions Precautions ?Precautions: Fall;Knee ?Precaution Booklet Issued: Yes (comment) ?Restrictions ?Weight Bearing Restrictions: Yes ?RLE Weight Bearing: Weight bearing as tolerated  ?  ? ?Mobility ? Bed Mobility ?Overal bed mobility: Needs Assistance ?Bed Mobility: Sit to Supine ? Sit to supine: Min assist ?  ?General bed mobility comments: Pt attempt to get OOB but was unable due to  severe/9/10 pai9n with any all movements. ?  ? ?Transfers ?Overall transfer level: Needs assistance ?Equipment used: Rolling walker (2 wheels) ?Transfers: Sit to/from Stand ?Sit to Stand: Supervision ?  ?   ?General transfer comment: no physical assistance to stand EOB, from recliner, and 2 x from mat table in rehab gym. ?  ? ?Ambulation/Gait ?Ambulation/Gait assistance: Supervision ?Gait Distance (Feet): 150 Feet ?Assistive device: Rolling walker (2 wheels) ?Gait Pattern/deviations: Step-through pattern, Antalgic, Trunk flexed ?Gait velocity: decreased ?  ?  ?General Gait Details: pt was able to ambulate to/from rehab gym with RW. Several standing rest breaks due to fatigue. ? ? ?Stairs ?Stairs: Yes ?Stairs assistance: Supervision ?Stair Management: One rail Right, Sideways, Step to pattern ?Number of Stairs: 4 ?General stair comments: pt was able to safely demonstrate ability to ascend/descend 4 stair with R rail only. Author demonstrated to pt's daughter but pt was abl to perform with supervision only. ? ?  ?Balance Overall balance assessment: Needs assistance ?Sitting-balance support: Feet supported ?Sitting balance-Leahy Scale: Good ?  ?  ?Standing balance support: Bilateral upper extremity supported, During functional activity, Reliant on assistive device for balance ?Standing balance-Leahy Scale: Good ?  ?  ?  ?Cognition Arousal/Alertness: Awake/alert ?Behavior During Therapy: [facility] for tasks assessed/performed ?Overall Cognitive Status: Within Functional Limits for tasks assessed ?  ?   ?General Comments: Pt is A and O x 4 but severely limited this afternoon with pain. ?  ?  ? ?  ?Exercises Total Joint Exercises ?Ankle Circles/Pumps: AROM, Strengthening, Both, 20 reps, Supine ?Quad Sets: AROM, Strengthening, Right, 10 reps, Supine ?Heel Slides: AROM, 10 reps, Supine, Strengthening ?Hip ABduction/ADduction: AAROM, 10 reps, Supine ?Straight Leg  Raises: AAROM, 10 reps, Supine ?Goniometric ROM: 88 degrees ? ?  ?    ? ?Pertinent Vitals/Pain Pain Assessment ?Pain Assessment: 0-10 ?Pain Score: 5  ?Pain Location: R knee ?Pain Descriptors / Indicators: Discomfort, Grimacing, Guarding ?Pain Intervention(s): Limited activity within patient's tolerance, Monitored during session, Premedicated before session, Repositioned  ? ? ? ?PT Goals (current goals can now be found in the care plan section) Acute Rehab PT Goals ?Patient Stated Goal: Go home when I'm ready ?Progress towards PT goals: Progressing toward goals ? ?  ?Frequency ? ? ? BID ? ? ? ?  ?PT Plan Current plan remains appropriate  ? ? ?   ?AM-PAC PT "6 Clicks" Mobility   ?Outcome Measure ? Help needed turning from your back to your side while in a flat bed without using bedrails?: A Little ?Help needed moving from lying on your back to sitting on the side of a flat bed without using bedrails?: A Little ?Help needed moving to and from a bed to a chair (including a wheelchair)?: A Little ?Help needed standing up from a chair using your arms (e.g., wheelchair or bedside chair)?: A Little ?Help needed to walk in hospital room?: A Little ?Help needed climbing 3-5 steps with a railing? : A Little ?6 Click Score: 18 ? ?  ?End of Session   ?Activity Tolerance: Patient tolerated treatment well ?Patient left: in bed;with call bell/phone within reach;with bed alarm set ?Nurse Communication: Mobility status ?PT Visit Diagnosis: Other abnormalities of gait and mobility (R26.89);Muscle weakness (generalized) (M62.81);Difficulty in walking, not elsewhere classified (R26.2);Pain ?Pain - Right/Left: Right ?Pain - part of body: Knee ?  ? ? ?Time: 1550-1602 ?PT Time Calculation (min) (ACUTE ONLY): 12 min ? ?Charges:  $Therapeutic Exercise: 8-22 mins          ?          ? ?Julaine Fusi PTA ?03/23/21, 4:20 PM  ? ?

## 2021-03-23 NOTE — Progress Notes (Signed)
?[facility]  ?ROUNDING NOTE  ? ?Subjective:  ? ?Patient seen sitting up in bed ?Alert and oriented ?States he completed a bowl of Cheerios ?Denies nausea ?Remains on room air ?Patient is seen later in the am walking in hallway with PT ? ?Creatinine 4.89 ?Urine output 866m in 24 hours ? ?Objective:  ?Vital signs in last 24 hours:  ?Temp:  [97.6 ?F (36.4 ?C)-98.6 ?F (37 ?C)] 97.8 ?F (36.6 ?C) (03/23 01937 ?Pulse Rate:  [52-84] 52 (03/23 0748) ?Resp:  [16-20] 16 (03/23 0748) ?BP: (133-185)/(68-106) 133/68 (03/23 0748) ?SpO2:  [90 %-100 %] 100 % (03/23 0448) ? ?Weight change:  ?Filed Weights  ? 03/21/21 0616  ?Weight: 89.1 kg  ? ? ?Intake/Output: ?I/O last 3 completed shifts: ?In: 1208.7 [P.O.:120; I.V.:1088.7] ?Out: 1995 [Urine:1875; Emesis/NG output:120] ?  ?Intake/Output this shift: ? Total I/O ?In: 120 [P.O.:120] ?Out: 400 [Urine:400] ? ?Physical Exam: ?General: NAD  ?Head: Normocephalic, atraumatic. Moist oral mucosal membranes  ?Eyes: Anicteric  ?Lungs:  Clear to auscultation, normal effort  ?Heart: Regular rate and rhythm  ?Abdomen:  Soft, nontender  ?Extremities:  No peripheral edema.  ?Neurologic: Nonfocal, moving all four extremities  ?Skin: No lesions  ?   ? ? ?Basic Metabolic Panel: ?Recent Labs  ?Lab 03/21/21 ?0639 03/22/21 ?0902403/23/23 ?0320  ?NA 142 138 134*  ?K 4.1 5.4* 4.8  ?CL 107 110 105  ?CO2  --  21* 21*  ?GLUCOSE 78 135* 104*  ?BUN 56* 65* 67*  ?CREATININE 4.90* 4.38* 4.89*  ?CALCIUM  --  8.0* 8.0*  ? ? ?Liver Function Tests: ?No results for input(s): AST, ALT, ALKPHOS, BILITOT, PROT, ALBUMIN in the last 168 hours. ?No results for input(s): LIPASE, AMYLASE in the last 168 hours. ?No results for input(s): AMMONIA in the last 168 hours. ? ?CBC: ?Recent Labs  ?Lab 03/21/21 ?0639 03/22/21 ?0097303/23/23 ?0320  ?WBC  --  6.1 6.5  ?HGB 10.9* 8.7* 7.6*  ?HCT 32.0* 25.9* 22.9*  ?MCV  --  97.0 98.7  ?PLT  --  185 172  ? ? ?Cardiac Enzymes: ?No results for input(s): CKTOTAL, CKMB, CKMBINDEX,  TROPONINI in the last 168 hours. ? ?BNP: ?Invalid input(s): POCBNP ? ?CBG: ?Recent Labs  ?Lab 03/22/21 ?1536  ?GLUCAP 137*  ? ? ?Microbiology: ?Results for orders placed or performed during the hospital encounter of 03/09/21  ?Surgical PCR Screen     Status: None  ? Collection Time: 03/09/21 10:17 AM  ? Specimen: Nasal Mucosa; Nasal Swab  ?Result Value Ref Range Status  ? MRSA, PCR NEGATIVE NEGATIVE Final  ? Staphylococcus aureus NEGATIVE NEGATIVE Final  ?  Comment: (NOTE) ?The Xpert SA Assay (FDA approved for NASAL specimens in patients 238?years of age and older), is one component of a comprehensive ?surveillance program. It is not intended to diagnose infection nor to ?guide or monitor treatment. ?Performed at [facility] [address] [id number]?  ? ? ?Coagulation Studies: ?No results for input(s): LABPROT, INR in the last 72 hours. ? ?Urinalysis: ?No results for input(s): COLORURINE, LABSPEC, [place] GLUCOSEU, HGBUR, BILIRUBINUR, KETONESUR, PROTEINUR, UROBILINOGEN, NITRITE, LEUKOCYTESUR in the last 72 hours. ? ?Invalid input(s): APPERANCEUR  ? ? ?Imaging: ?[place] ? ?Result Date: 03/22/2021 ?CLINICAL DATA:  Acute kidney injury EXAM: RENAL / URINARY TRACT ULTRASOUND COMPLETE COMPARISON:  CT abdomen pelvis 02/24/2020 FINDINGS: Right Kidney: Renal measurements: 9.8 x 4.6 x 5.2 cm = volume: 123 mL. Increased echogenicity of the cortex. 2 cm cyst right lateral cortex  unchanged from CT. No mass or hydronephrosis. Left Kidney: Renal measurements: 9.8 x 4.4 x 4.4 cm = volume: 99 mL. Increased echogenicity of the renal cortex. 17 mm cyst left medial kidney not seen on the prior CT. Bladder: Appears normal for degree of bladder distention. Other: Right pleural effusion. Prostate measures 3.4 x 4.8 x 5.6 cm.  Prostate volume 48 mL. IMPRESSION: Mild increased echogenicity of the kidneys bilaterally. No renal hydronephrosis. Prostate enlargement Electronically Signed   By: Franchot Gallo  M.D.   On: 03/22/2021 15:24   ? ? ?Medications:  ? ? sodium chloride 75 mL/hr at 03/23/21 3668  ? methocarbamol (ROBAXIN) IV    ? ? apixaban  2.5 mg Oral BID  ? carvedilol  3.125 mg Oral BID WC  ? cholecalciferol  1,000 Units Oral Daily  ? docusate sodium  100 mg Oral BID  ? ferrous [id number] C-folic acid  1 capsule Oral BID PC  ? multivitamin with minerals  1 tablet Oral Daily  ? pantoprazole  40 mg Oral Daily  ? rosuvastatin  10 mg Oral QHS  ? scopolamine  1 patch Transdermal Q72H  ? tamsulosin  0.4 mg Oral Daily  ? traMADol  50 mg Oral Q6H  ? ?acetaminophen, alum & mag hydroxide-simeth, bisacodyl, diphenhydrAMINE, HYDROcodone-acetaminophen, HYDROcodone-acetaminophen, hydrocortisone cream, menthol-cetylpyridinium **OR** phenol, methocarbamol **OR** methocarbamol (ROBAXIN) IV, metoCLOPramide **OR** metoCLOPramide (REGLAN) injection, morphine injection, ondansetron **OR** ondansetron (ZOFRAN) IV, polyethylene glycol, zolpidem ? ?Assessment/ Plan:  ?[name]. is a 81 y.o.  male with past medical conditions including hypertension, diverticulosis, CAD, CHF, atrial fibrillation on Eliquis, and chronic kidney disease stage IV, who was admitted to [facility] on 03/21/2021 for S/P TKR (total knee replacement) using cement, right [Z96.651] ? ? ?Acute kidney injury likely due to poor oral intake with diuresis.  ? Will continue to monitor oral intake. Continue IVF at this time. Potassium 4.8. Creatinine increased today with urine output 829m in 24 hours. No acute need for dialysis, but discussed with patient that he may need in the near future. Will continue to monitor renal function.  ? ?Lab Results  ?Component Value Date  ? CREATININE 4.89 (H) 03/23/2021  ? CREATININE 4.38 (H) 03/22/2021  ? CREATININE 4.90 (H) 03/21/2021  ? ? ?Intake/Output Summary (Last 24 hours) at 03/23/2021 1049 ?Last data filed at 03/23/2021 [phone]?Gross per 24 hour  ?Intake 240 ml  ?Output 1200 ml  ?Net -960 ml  ? ?2. Anemia of chronic  kidney disease ?Normocytic ?Lab Results  ?Component Value Date  ? HGB 7.6 (L) 03/23/2021  ?  ?Hgb not at target. Will consider iron supplementation.  ? ?3. Secondary Hyperparathyroidism: ?Lab Results  ?Component Value Date  ? CALCIUM 8.0 (L) 03/23/2021  ? CAION 1.17 03/21/2021  ? PHOS 4.9 (H) 02/17/2018  ?  ?Calcium just below desired target. Continue cholecalciferol.  ? ?4. Hypertension with chronic kidney disease.Home regimen includes carvedilol and torsemide.  Torsemide held ? ? ? LOS: 0 ?[place]?3/23/202310:49 AM ?  ?

## 2021-03-23 NOTE — Progress Notes (Addendum)
0930 ?Assessment Completed. Ax4. Lungs clear on RA . Pt can reach 2500 on IS. 20G IV to R forearm. Wound vac Dressing to R knee with scant old of old drainage noted in canister. Otherwise skin intact. Personal belongings and call bell within reach. Bed in locked and low position. Nurse will cont. To monitor. ? ? ?1312 ?Pt is resting in bed quietly pain controlled with no complaints of nausea. Call bell and possessions within reach ? ?Shift Summary ?Pt alert and oriented x4. Had uneventful shift. Ambulating x1 assist in hallway with PT and bathroom with staff. Foley in place for retention after surgery, nephro onboard. Plan to d/c home with home health when cleared.  ?

## 2021-03-24 LAB — BASIC METABOLIC PANEL
Anion gap: 9 (ref 5–15)
BUN: 74 mg/dL — ABNORMAL HIGH (ref 8–23)
CO2: 20 mmol/L — ABNORMAL LOW (ref 22–32)
Calcium: 8 mg/dL — ABNORMAL LOW (ref 8.9–10.3)
Chloride: 105 mmol/L (ref 98–111)
Creatinine, Ser: 5.41 mg/dL — ABNORMAL HIGH (ref 0.61–1.24)
GFR, Estimated: 10 mL/min — ABNORMAL LOW (ref >60)
Glucose, Bld: 101 mg/dL — ABNORMAL HIGH (ref 70–99)
Potassium: 4.4 mmol/L (ref 3.5–5.1)
Sodium: 134 mmol/L — ABNORMAL LOW (ref 135–145)

## 2021-03-24 LAB — CBC
HCT: 21.9 % — ABNORMAL LOW (ref 39.0–52.0)
Hemoglobin: 7.2 g/dL — ABNORMAL LOW (ref 13.0–17.0)
MCH: 32.7 pg (ref 26.0–34.0)
MCHC: 32.9 g/dL (ref 30.0–36.0)
MCV: 99.5 fL (ref 80.0–100.0)
Platelets: 147 10*3/uL — ABNORMAL LOW (ref 150–400)
RBC: 2.2 MIL/uL — ABNORMAL LOW (ref 4.22–5.81)
RDW: 12.6 % (ref 11.5–15.5)
WBC: 6.6 10*3/uL (ref 4.0–10.5)
nRBC: 0 % (ref 0.0–0.2)

## 2021-03-24 NOTE — Progress Notes (Signed)
? ?Subjective: ?3 Days Post-Op Procedure(s) (LRB): ?TOTAL KNEE ARTHROPLASTY (Right) ?Patient reports pain as mild.   ?Patient is doing well with no complaints.  ?Denies any CP, SOB, ABD pain. ?We will continue therapy today.  ?Plan is to go Home after hospital stay. ? ?Objective: ?Vital signs in last 24 hours: ?Temp:  [97.7 ?F (36.5 ?C)-98 ?F (36.7 ?C)] 97.8 ?F (36.6 ?C) (03/24 0554) ?Pulse Rate:  [52-71] 68 (03/24 0554) ?Resp:  [16-20] 20 (03/24 0554) ?BP: (129-149)/(67-86) 145/67 (03/24 0554) ?SpO2:  [94 %-100 %] 100 % (03/24 0554) ? ?Intake/Output from previous day: ?03/23 0701 - 03/24 0700 ?In: 840 [P.O.:840] ?Out: 1600 [Urine:1600] ?Intake/Output this shift: ?No intake/output data recorded. ? ?Recent Labs  ?  03/22/21 ?8299 03/23/21 ?0320  ?HGB 8.7* 7.6*  ? ?Recent Labs  ?  03/22/21 ?3716 03/23/21 ?0320  ?WBC 6.1 6.5  ?RBC 2.67* 2.32*  ?HCT 25.9* 22.9*  ?PLT 185 172  ? ?Recent Labs  ?  03/22/21 ?9678 03/23/21 ?0320  ?NA 138 134*  ?K 5.4* 4.8  ?CL 110 105  ?CO2 21* 21*  ?BUN 65* 67*  ?CREATININE 4.38* 4.89*  ?GLUCOSE 135* 104*  ?CALCIUM 8.0* 8.0*  ? ?No results for input(s): LABPT, INR in the last 72 hours. ? ?EXAM ?General - Patient is Alert, Appropriate, and Oriented ?Extremity - Neurovascular intact ?Sensation intact distally ?Intact pulses distally ?Dorsiflexion/Plantar flexion intact ?No cellulitis present ?Compartment soft ?Dressing - dressing C/D/I and no drainage, provena intact with out drainage ?Motor Function - intact, moving foot and toes well on exam.  ? ?Past Medical History:  ?Diagnosis Date  ? Aortic atherosclerosis ([place])   ? Arthritis   ? Atrial fibrillation and flutter ([place])   ? a.) CHA2DS2-VASc = 5 (age x 2, CHF, HTN, aortic plaque). b.) rate/rhythm maintain on oral carvedilol; chronically anticoagulated with dose reduced apixaban  ? BPH (benign prostatic hyperplasia)   ? Cardiac syncope   ? Cardiomyopathy ([place])   ? a.) TTE 04/23/2016: EF 40%. b.) TTE 09/03/2016: EF 50%. c.) TTE 05/04/2019:  EF >55%.  ? CHF (congestive heart failure) ([place])   ? a.) TTE 04/23/2016: EF 40%; BAE; mild AR/TR, mod MR; G1DD. b.) TTE 09/03/2016: EF 50%; mild LVH; BAE; triv AR/PR, mod MR/TR. c.) TTE 05/04/2019: EF >55%; mod LVH, mild LA enlargement; mild AR/MR/TR  ? CKD (chronic kidney disease), stage IV ([place])   ? Complication of anesthesia   ? a.) h/o intra/postoperative A.fib  ? Coronary artery disease   ? Diverticulosis   ? Gout   ? History of 2019 novel coronavirus disease (COVID-19) 11/14/2020  ? History of colon polyps   ? Hypercholesteremia   ? Hypertension   ? Long term current use of anticoagulant   ? a.) dose reduced apixaban; dose reduction d/t to age and CKD Dx.  ? 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 a.) s/p resection  ? MGUS (monoclonal gammopathy of unknown significance)   ? Pleural effusion   ? Prostatism   ? Rectal bleeding   ? ? ?Assessment/Plan:   ?3 Days Post-Op Procedure(s) (LRB): ?TOTAL KNEE ARTHROPLASTY (Right) ?Principal Problem: ?  S/P TKR (total knee replacement) using cement, right ? ?Estimated body mass index is 27.4 kg/m? as calculated from the following: ?  Height as of this encounter: '5\' 11"'$  (1.803 m). ?  Weight as of this encounter: 89.1 kg. ?Advance diet ?Up with therapy ?Pain controlled ?Nausea resolved ?Hyperkalemia - Resolved ?Acute renal insufficiency - Appreciate Nephrology input ?Acute post op blood  loss anemia - Continue with Iron supplement ?Labs pending ?CM to assist with discharge to home with HHPT, patient making good progress with PT ? ? ?DVT Prophylaxis - TED hose and SCDs Eliquis ?Weight-Bearing as tolerated to right leg ? ? ?T. Rachelle Hora, PA-C ?[place] ?03/24/2021, 7:08 AM ?  ?

## 2021-03-24 NOTE — Progress Notes (Signed)
?[name]  ?ROUNDING NOTE  ? ?Subjective:  ? ?Patient seen sitting in chair ?Alert and oriented ?Tolerating meals without nausea and vomiting ?Denies shortness of breath ? ?Creatinine 5.41 ?Urine output 1.6L in last 24 hours ? ?Objective:  ?Vital signs in last 24 hours:  ?Temp:  [97.6 ?F (36.4 ?C)-98 ?F (36.7 ?C)] 97.6 ?F (36.4 ?C) (03/24 2637) ?Pulse Rate:  [60-71] 67 (03/24 0748) ?Resp:  [16-20] 17 (03/24 0748) ?BP: (129-149)/(67-86) 137/76 (03/24 0748) ?SpO2:  [94 %-100 %] 100 % (03/24 0748) ? ?Weight change:  ?Filed Weights  ? 03/21/21 0616  ?Weight: 89.1 kg  ? ? ?Intake/Output: ?I/O last 3 completed shifts: ?In: 840 [P.O.:840] ?Out: 1900 [Urine:1900] ?  ?Intake/Output this shift: ? Total I/O ?In: 240 [P.O.:240] ?Out: 200 [Urine:200] ? ?Physical Exam: ?General: NAD, sitting in chair  ?Head: Normocephalic, atraumatic. Moist oral mucosal membranes  ?Eyes: Anicteric  ?Lungs:  Clear to auscultation, normal effort  ?Heart: Regular rate and rhythm  ?Abdomen:  Soft, nontender  ?Extremities:  No peripheral edema.Rt knee dressing  ?Neurologic: Nonfocal, moving all four extremities  ?Skin: No lesions  ?   ? ? ?Basic Metabolic Panel: ?Recent Labs  ?Lab 03/21/21 ?0639 03/22/21 ?8588 03/23/21 ?0320 03/24/21 ?5027  ?NA 142 138 134* 134*  ?K 4.1 5.4* 4.8 4.4  ?CL 107 110 105 105  ?CO2  --  21* 21* 20*  ?GLUCOSE 78 135* 104* 101*  ?BUN 56* 65* 67* 74*  ?CREATININE 4.90* 4.38* 4.89* 5.41*  ?CALCIUM  --  8.0* 8.0* 8.0*  ? ? ? ?Liver Function Tests: ?No results for input(s): AST, ALT, ALKPHOS, BILITOT, PROT, ALBUMIN in the last 168 hours. ?No results for input(s): LIPASE, AMYLASE in the last 168 hours. ?No results for input(s): AMMONIA in the last 168 hours. ? ?CBC: ?Recent Labs  ?Lab 03/21/21 ?0639 03/22/21 ?7412 03/23/21 ?0320 03/24/21 ?8786  ?WBC  --  6.1 6.5 6.6  ?HGB 10.9* 8.7* 7.6* 7.2*  ?HCT 32.0* 25.9* 22.9* 21.9*  ?MCV  --  97.0 98.7 99.5  ?PLT  --  185 172 147*  ? ? ? ?Cardiac Enzymes: ?No results for  input(s): CKTOTAL, CKMB, CKMBINDEX, TROPONINI in the last 168 hours. ? ?BNP: ?Invalid input(s): POCBNP ? ?CBG: ?Recent Labs  ?Lab 03/22/21 ?1536  ?GLUCAP 137*  ? ? ? ?Microbiology: ?Results for orders placed or performed during the hospital encounter of 03/09/21  ?Surgical PCR Screen     Status: None  ? Collection Time: 03/09/21 10:17 AM  ? Specimen: Nasal Mucosa; Nasal Swab  ?Result Value Ref Range Status  ? MRSA, PCR NEGATIVE NEGATIVE Final  ? Staphylococcus aureus NEGATIVE NEGATIVE Final  ?  Comment: (NOTE) ?The Xpert SA Assay (FDA approved for NASAL specimens in patients 59 ?years of age and older), is one component of a comprehensive ?surveillance program. It is not intended to diagnose infection nor to ?guide or monitor treatment. ?Performed at [facility], [place], ?[place] [address] ?  ? ? ?Coagulation Studies: ?No results for input(s): LABPROT, INR in the last 72 hours. ? ?Urinalysis: ?No results for input(s): COLORURINE, LABSPEC, [place], GLUCOSEU, HGBUR, BILIRUBINUR, KETONESUR, PROTEINUR, UROBILINOGEN, NITRITE, LEUKOCYTESUR in the last 72 hours. ? ?Invalid input(s): APPERANCEUR  ? ? ?Imaging: ?[place] RENAL ? ?Result Date: 03/22/2021 ?CLINICAL DATA:  Acute kidney injury EXAM: RENAL / URINARY TRACT ULTRASOUND COMPLETE COMPARISON:  CT abdomen pelvis 02/24/2020 FINDINGS: Right Kidney: Renal measurements: 9.8 x 4.6 x 5.2 cm = volume: 123 mL. Increased echogenicity of the  cortex. 2 cm cyst right lateral cortex unchanged from CT. No mass or hydronephrosis. Left Kidney: Renal measurements: 9.8 x 4.4 x 4.4 cm = volume: 99 mL. Increased echogenicity of the renal cortex. 17 mm cyst left medial kidney not seen on the prior CT. Bladder: Appears normal for degree of bladder distention. Other: Right pleural effusion. Prostate measures 3.4 x 4.8 x 5.6 cm.  Prostate volume 48 mL. IMPRESSION: Mild increased echogenicity of the kidneys bilaterally. No renal hydronephrosis. Prostate enlargement  Electronically Signed   By: Franchot Gallo M.D.   On: 03/22/2021 15:24   ? ? ?Medications:  ? ? sodium chloride 75 mL/hr at 03/23/21 2197  ? methocarbamol (ROBAXIN) IV    ? ? apixaban  2.5 mg Oral BID  ? carvedilol  3.125 mg Oral BID WC  ? cholecalciferol  1,000 Units Oral Daily  ? docusate sodium  100 mg Oral BID  ? ferrous [id number] C-folic acid  1 capsule Oral BID PC  ? multivitamin with minerals  1 tablet Oral Daily  ? pantoprazole  40 mg Oral Daily  ? rosuvastatin  10 mg Oral QHS  ? scopolamine  1 patch Transdermal Q72H  ? tamsulosin  0.4 mg Oral Daily  ? traMADol  50 mg Oral Q6H  ? ?acetaminophen, alum & mag hydroxide-simeth, bisacodyl, diphenhydrAMINE, HYDROcodone-acetaminophen, HYDROcodone-acetaminophen, hydrocortisone cream, menthol-cetylpyridinium **OR** phenol, methocarbamol **OR** methocarbamol (ROBAXIN) IV, metoCLOPramide **OR** metoCLOPramide (REGLAN) injection, morphine injection, ondansetron **OR** ondansetron (ZOFRAN) IV, polyethylene glycol, zolpidem ? ?Assessment/ Plan:  ?[name]. is a 81 y.o.  male with past medical conditions including hypertension, diverticulosis, CAD, CHF, atrial fibrillation on Eliquis, and chronic kidney disease stage IV, who was admitted to [facility] on 03/21/2021 for S/P TKR (total knee replacement) using cement, right [Z96.651] ? ? ?Acute kidney injury likely due to poor oral intake with diuresis.  ? Will continue to monitor oral intake. Continue IVF at this time. Potassium 4.4. Creatinine continues to increase with adequate urine output. We will continue to monitor renal function but discussed with patient that dialysis may be required during this hospitalization.  ? ?Lab Results  ?Component Value Date  ? CREATININE 5.41 (H) 03/24/2021  ? CREATININE 4.89 (H) 03/23/2021  ? CREATININE 4.38 (H) 03/22/2021  ? ? ?Intake/Output Summary (Last 24 hours) at 03/24/2021 1100 ?Last data filed at 03/24/2021 1018 ?Gross per 24 hour  ?Intake 960 ml  ?Output 1400 ml   ?Net -440 ml  ? ? ?2. Anemia of chronic kidney disease ?Normocytic ?Lab Results  ?Component Value Date  ? HGB 7.2 (L) 03/24/2021  ?  ?Hgb decreased. May require ESA's.  ? ?3. Secondary Hyperparathyroidism: ?Lab Results  ?Component Value Date  ? CALCIUM 8.0 (L) 03/24/2021  ? CAION 1.17 03/21/2021  ? PHOS 4.9 (H) 02/17/2018  ?  ?Monitoring bone minerals during this admission.  Continue cholecalciferol.  ? ?4. Hypertension with chronic kidney disease.Home regimen includes carvedilol and torsemide.  Torsemide held ? BP stable ? ? ? LOS: 1 ?[place] ?3/24/202311:00 AM ?  ?

## 2021-03-24 NOTE — Progress Notes (Signed)
PT Cancellation Note ? ?Patient Details ?Name: Lawrence Perkins. ?MRN: [id number] ?DOB: [birth year] ? ? ?Cancelled Treatment:     PT attempt 2 x this afternoon. Pt requesting to rest. " I didn't sleep at all last night." Pt is progressing well with all PT goals. Will continue to follow per current POC until DC. Continued recommendation for home with HHPT at DC.  ? ? ?Willette Pa ?03/24/2021, 3:22 PM ?

## 2021-03-24 NOTE — Progress Notes (Signed)
Physical Therapy Treatment ?Patient Details ?Name: Lawrence Perkins. ?MRN: [id number] ?DOB: [birth year] ?Today's Date: 03/24/2021 ? ? ?History of Present Illness Pt is a 81 y.o. male s/p elective R TKA. PMH includes: CAD, cardiomyopathy, CHF, atrial fibrillation, aortic atherosclerosis, cardiac syncope, CKD-IV, HTN, HLD, OA, BPH. ? ?  ?PT Comments  ? ? Pt was sitting in recliner upon arriving. He is A and O x 4 and agreeable to session. Easily able to stand and ambulate with RW however is limited by pain. Supportive spouse and and daughter at bedside. Pt overall is doing well from a PT standpoint. Performed stairs previous session without issues but is limited by pain. Will make sure he is pre-medicated prior to PM session. Home with HHPT once medically cleared.  ?  ?Recommendations for follow up therapy are one component of a multi-disciplinary discharge planning process, led by the attending physician.  Recommendations may be updated based on patient status, additional functional criteria and insurance authorization. ? ?Follow Up Recommendations ? Home health PT ?  ?  ?Assistance Recommended at Discharge Intermittent Supervision/Assistance  ?Patient can return home with the following A little help with walking and/or transfers;Assistance with cooking/housework;Assist for transportation;Help with stairs or ramp for entrance ?  ?Equipment Recommendations ? None recommended by PT  ?  ?   ?Precautions / Restrictions Precautions ?Precautions: Fall;Knee ?Precaution Booklet Issued: Yes (comment) ?Restrictions ?Weight Bearing Restrictions: Yes ?RLE Weight Bearing: Weight bearing as tolerated  ?  ? ?Mobility ? Bed Mobility ?Overal bed mobility: Needs Assistance ?Bed Mobility: Sit to Supine ?  ?Sit to supine: Min assist ?  ?General bed mobility comments: Pt required min assist to progress LEs back into the bed. ?  ? ?Transfers ?Overall transfer level: Needs assistance ?Equipment used: Rolling walker (2 wheels) ?Transfers:  Sit to/from Stand ?Sit to Stand: Supervision ?  ?  ?  ?Ambulation/Gait ?Ambulation/Gait assistance: Supervision ?Gait Distance (Feet): 120 Feet ?Assistive device: Rolling walker (2 wheels) ?Gait Pattern/deviations: Step-through pattern, Antalgic, Trunk flexed ?Gait velocity: decreased ?  ?  ?General Gait Details: pt was easily able to ambulate 120 ft without LOB or safety concerbn. Distance limited by pain. ? ? ?Stairs ?   ?General stair comments: pt requested not to perform stairs but was able to safely ascend/descend stairs. ? ? ?  ?Balance Overall balance assessment: Needs assistance ?Sitting-balance support: Feet supported ?Sitting balance-Leahy Scale: Good ?  ?  ?Standing balance support: Bilateral upper extremity supported, During functional activity, Reliant on assistive device for balance ?Standing balance-Leahy Scale: Good ?   ?Cognition Arousal/Alertness: Awake/alert ?Behavior During Therapy: [facility] for tasks assessed/performed ?Overall Cognitive Status: Within Functional Limits for tasks assessed ?  ?   ?General Comments: Pt is A and O x 4 ?  ?  ? ?  ?   ?   ? ?Pertinent Vitals/Pain Pain Assessment ?Pain Assessment: 0-10 ?Pain Score: 9  ?Pain Location: R knee ?Pain Descriptors / Indicators: Discomfort, Grimacing, Guarding ?Pain Intervention(s): Limited activity within patient's tolerance, Monitored during session, Premedicated before session, Repositioned, Ice applied  ? ? ? ?PT Goals (current goals can now be found in the care plan section) Acute Rehab PT Goals ?Patient Stated Goal: go home ?Progress towards PT goals: Progressing toward goals ? ?  ?Frequency ? ? ? BID ? ? ? ?  ?PT Plan Current plan remains appropriate  ? ? ?   ?AM-PAC PT "6 Clicks" Mobility   ?Outcome Measure ? Help needed turning from your back to your side while  in a flat bed without using bedrails?: A Little ?Help needed moving from lying on your back to sitting on the side of a flat bed without using bedrails?: A Little ?Help needed  moving to and from a bed to a chair (including a wheelchair)?: A Little ?Help needed standing up from a chair using your arms (e.g., wheelchair or bedside chair)?: A Little ?Help needed to walk in hospital room?: A Little ?Help needed climbing 3-5 steps with a railing? : A Little ?6 Click Score: 18 ? ?  ?End of Session   ?Activity Tolerance: Patient tolerated treatment well ?Patient left: in bed;with call bell/phone within reach;with bed alarm set ?Nurse Communication: Mobility status ?PT Visit Diagnosis: Other abnormalities of gait and mobility (R26.89);Muscle weakness (generalized) (M62.81);Difficulty in walking, not elsewhere classified (R26.2);Pain ?Pain - Right/Left: Right ?Pain - part of body: Knee ?  ? ? ?Time: 1100-1120 ?PT Time Calculation (min) (ACUTE ONLY): 20 min ? ?Charges:  $Gait Training: 8-22 mins          ?          ?Julaine Fusi PTA ?03/24/21, 11:38 AM  ? ?

## 2021-03-25 LAB — RENAL FUNCTION PANEL
Albumin: 2.8 g/dL — ABNORMAL LOW (ref 3.5–5.0)
Anion gap: 11 (ref 5–15)
BUN: 80 mg/dL — ABNORMAL HIGH (ref 8–23)
CO2: 16 mmol/L — ABNORMAL LOW (ref 22–32)
Calcium: 8 mg/dL — ABNORMAL LOW (ref 8.9–10.3)
Chloride: 102 mmol/L (ref 98–111)
Creatinine, Ser: 5.67 mg/dL — ABNORMAL HIGH (ref 0.61–1.24)
GFR, Estimated: 9 mL/min — ABNORMAL LOW (ref >60)
Glucose, Bld: 104 mg/dL — ABNORMAL HIGH (ref 70–99)
Phosphorus: 6.6 mg/dL — ABNORMAL HIGH (ref 2.5–4.6)
Potassium: 4.7 mmol/L (ref 3.5–5.1)
Sodium: 129 mmol/L — ABNORMAL LOW (ref 135–145)

## 2021-03-25 LAB — CBC
HCT: 22.1 % — ABNORMAL LOW (ref 39.0–52.0)
Hemoglobin: 7.1 g/dL — ABNORMAL LOW (ref 13.0–17.0)
MCH: 32.7 pg (ref 26.0–34.0)
MCHC: 32.1 g/dL (ref 30.0–36.0)
MCV: 101.8 fL — ABNORMAL HIGH (ref 80.0–100.0)
Platelets: 166 10*3/uL (ref 150–400)
RBC: 2.17 MIL/uL — ABNORMAL LOW (ref 4.22–5.81)
RDW: 12.4 % (ref 11.5–15.5)
WBC: 7.1 10*3/uL (ref 4.0–10.5)
nRBC: 0 % (ref 0.0–0.2)

## 2021-03-25 LAB — IRON AND TIBC
Iron: 55 ug/dL (ref 45–182)
Saturation Ratios: 22 % (ref 17.9–39.5)
TIBC: 246 ug/dL — ABNORMAL LOW (ref 250–450)
UIBC: 191 ug/dL

## 2021-03-25 LAB — TRANSFERRIN: Transferrin: 173 mg/dL — ABNORMAL LOW (ref 180–329)

## 2021-03-25 LAB — FERRITIN: Ferritin: 138 ng/mL (ref 24–336)

## 2021-03-25 MED ORDER — BISACODYL 10 MG RE SUPP
10.0000 mg | Freq: Every day | RECTAL | Status: DC | PRN
  Administered 2021-03-25 – 2021-03-29 (×3): 10 mg via RECTAL
  Filled 2021-03-25 (×3): qty 1

## 2021-03-25 MED ORDER — CHLORHEXIDINE GLUCONATE CLOTH 2 % EX PADS
6.0000 | MEDICATED_PAD | Freq: Every day | CUTANEOUS | Status: DC
  Administered 2021-03-26 – 2021-03-29 (×4): 6 via TOPICAL

## 2021-03-25 MED ORDER — DARBEPOETIN ALFA 100 MCG/0.5ML IJ SOSY
100.0000 ug | PREFILLED_SYRINGE | INTRAMUSCULAR | Status: DC
  Administered 2021-03-25: 100 ug via SUBCUTANEOUS
  Filled 2021-03-25: qty 0.5

## 2021-03-25 NOTE — Plan of Care (Signed)

## 2021-03-25 NOTE — TOC Transition Note (Signed)
Transition of Care (TOC) - CM/SW Discharge Note ? ? ?Patient Details  ?Name: Lawrence Perkins. ?MRN: [id number] ?Date of Birth: 1940-07-30 ? ?Transition of Care (TOC) CM/SW Contact:  ?Izola Price, RN ?Phone Number: ?03/25/2021, 10:05 AM ? ? ?Clinical Narrative:  3/25: Patient has discharge orders in. [facility] set up via [facility] and notified via Malachy Mood of discharge today. No DME needed to be ordered. Simmie Davies RN CM   ? ? ? ?Final next level of care: [place] ?Barriers to Discharge: Barriers Resolved ? ? ?Patient Goals and CMS Choice ?  ?  ?  ? ?Discharge Placement ?  ?           ?  ?  ?  ?  ? ?Discharge Plan and Services ?  ?Discharge Planning Services: CM Consult ?           ?DME Arranged: N/A ?  ?  ?  ?  ?HH Arranged: PT ?[place] Agency: [place] ?Date HH Agency Contacted: 03/22/21 ?Time [place]: [id number] ?Representative spoke with at [place]: cheryl ? ?Social Determinants of Health (SDOH) Interventions ?  ? ? ?Readmission Risk Interventions ?   ? View : No data to display.  ?  ?  ?  ? ? ? ? ? ?

## 2021-03-25 NOTE — Progress Notes (Signed)
?[facility]  ?ROUNDING NOTE  ? ?Subjective:  ? ?Patient walked around the hallways with physical therapy earlier today. ?His wife reports that he is starting to get confused and he is mumbling ?His creatinine unfortunately has worsened again to 5.67, BUN is 80.  Sodium level has worsened to 129. ?Hemoglobin is low at 7.1 today ?Urine output from last 24 hours is 101 5 cc. ?Patient had issues with urinary retention and was unable to void this morning.  Bladder scan was 628 mL per documentation.  Foley catheter was reinserted. ? ?Objective:  ?Vital signs in last 24 hours:  ?Temp:  [97.6 ?F (36.4 ?C)-98 ?F (36.7 ?C)] 97.6 ?F (36.4 ?C) (03/25 2440) ?Pulse Rate:  [58-78] 68 (03/25 0833) ?Resp:  [16-20] 18 (03/25 1027) ?BP: (144-161)/(76-95) 161/95 (03/25 2536) ?SpO2:  [99 %-100 %] 99 % (03/25 0833) ? ?Weight change:  ?Filed Weights  ? 03/21/21 0616  ?Weight: 89.1 kg  ? ? ?Intake/Output: ?I/O last 3 completed shifts: ?In: 720 [P.O.:720] ?Out: 2215 [Urine:2215] ?  ?Intake/Output this shift: ? Total I/O ?In: 120 [P.O.:120] ?Out: 675 [Urine:675] ? ?Physical Exam: ?General: NAD, laying in the bed  ?Head: Normocephalic, atraumatic. Moist oral mucosal membranes  ?Eyes: Anicteric  ?Lungs:  Clear to auscultation, normal effort on room air  ?Heart: Regular rate and rhythm  ?Abdomen:  Soft, nontender  ?Extremities:  Rt knee dressing  ?Neurologic: Alert able to follow commands  ?Skin: No lesions  ? Foley catheter in place  ? ? ?Basic Metabolic Panel: ?Recent Labs  ?Lab 03/21/21 ?0639 03/22/21 ?6440 03/22/21 ?3474 03/23/21 ?0320 03/24/21 ?2595 03/25/21 ?6387  ?NA 142 138  --  134* 134* 129*  ?K 4.1 5.4*  --  4.8 4.4 4.7  ?CL 107 110  --  105 105 102  ?CO2  --  21*  --  21* 20* 16*  ?GLUCOSE 78 135*  --  104* 101* 104*  ?BUN 56* 65*  --  67* 74* 80*  ?CREATININE 4.90* 4.38*  --  4.89* 5.41* 5.67*  ?CALCIUM  --  8.0*   < > 8.0* 8.0* 8.0*  ?PHOS  --   --   --   --   --  6.6*  ? < > = values in this interval not displayed.   ? ? ? ?Liver Function Tests: ?Recent Labs  ?Lab 03/25/21 ?0950  ?ALBUMIN 2.8*  ? ?No results for input(s): LIPASE, AMYLASE in the last 168 hours. ?No results for input(s): AMMONIA in the last 168 hours. ? ?CBC: ?Recent Labs  ?Lab 03/21/21 ?0639 03/22/21 ?5643 03/23/21 ?0320 03/24/21 ?3295 03/25/21 ?1884  ?WBC  --  6.1 6.5 6.6 7.1  ?HGB 10.9* 8.7* 7.6* 7.2* 7.1*  ?HCT 32.0* 25.9* 22.9* 21.9* 22.1*  ?MCV  --  97.0 98.7 99.5 101.8*  ?PLT  --  185 172 147* 166  ? ? ? ?Cardiac Enzymes: ?No results for input(s): CKTOTAL, CKMB, CKMBINDEX, TROPONINI in the last 168 hours. ? ?BNP: ?Invalid input(s): POCBNP ? ?CBG: ?Recent Labs  ?Lab 03/22/21 ?1536  ?GLUCAP 137*  ? ? ? ?Microbiology: ?Results for orders placed or performed during the hospital encounter of 03/09/21  ?Surgical PCR Screen     Status: None  ? Collection Time: 03/09/21 10:17 AM  ? Specimen: Nasal Mucosa; Nasal Swab  ?Result Value Ref Range Status  ? MRSA, PCR NEGATIVE NEGATIVE Final  ? Staphylococcus aureus NEGATIVE NEGATIVE Final  ?  Comment: (NOTE) ?The Xpert SA Assay (FDA approved for NASAL specimens in patients  1 ?years of age and older), is one component of a comprehensive ?surveillance program. It is not intended to diagnose infection nor to ?guide or monitor treatment. ?Performed at [facility], [place], ?[place] [address] ?  ? ? ?Coagulation Studies: ?No results for input(s): LABPROT, INR in the last 72 hours. ? ?Urinalysis: ?No results for input(s): COLORURINE, LABSPEC, [place], GLUCOSEU, HGBUR, BILIRUBINUR, KETONESUR, PROTEINUR, UROBILINOGEN, NITRITE, LEUKOCYTESUR in the last 72 hours. ? ?Invalid input(s): APPERANCEUR  ? ? ?Imaging: ?No results found. ? ? ?Medications:  ? ? sodium chloride 75 mL/hr at 03/24/21 2036  ? methocarbamol (ROBAXIN) IV    ? ? apixaban  2.5 mg Oral BID  ? carvedilol  3.125 mg Oral BID WC  ? cholecalciferol  1,000 Units Oral Daily  ? darbepoetin (ARANESP) injection - NON-DIALYSIS  100 mcg Subcutaneous  Q Sat-1800  ? docusate sodium  100 mg Oral BID  ? ferrous [id number] C-folic acid  1 capsule Oral BID PC  ? multivitamin with minerals  1 tablet Oral Daily  ? pantoprazole  40 mg Oral Daily  ? rosuvastatin  10 mg Oral QHS  ? scopolamine  1 patch Transdermal Q72H  ? tamsulosin  0.4 mg Oral Daily  ? traMADol  50 mg Oral Q6H  ? ?acetaminophen, alum & mag hydroxide-simeth, bisacodyl, diphenhydrAMINE, HYDROcodone-acetaminophen, HYDROcodone-acetaminophen, hydrocortisone cream, menthol-cetylpyridinium **OR** phenol, methocarbamol **OR** methocarbamol (ROBAXIN) IV, metoCLOPramide **OR** metoCLOPramide (REGLAN) injection, morphine injection, ondansetron **OR** ondansetron (ZOFRAN) IV, polyethylene glycol, zolpidem ? ?Assessment/ Plan:  ?[name]. is a 81 y.o.  male with past medical conditions including hypertension, diverticulosis, CAD, CHF, atrial fibrillation on Eliquis, and chronic kidney disease stage IV, who was admitted to [facility] on 03/21/2021 for S/P TKR (total knee replacement) using cement, right [Z96.651] ? ? ?Acute kidney injury on chronic kidney disease stage IV.  Baseline creatinine of 3.90, GFR 15 from March 09, 2021.  Patient is followed by Dr. Rockwell Germany from [facility] nephrology. ?AKI likely multifactorial status post surgery with issues of urinary retention.  Early on patient had decreased oral intake and there were concerns of dehydration.  Foley catheter has been placed now. ?Patient has good urine output of over 1 L yesterday.  675 cc this morning has been documented. ?Patient has developed some confusion which may also be multifactorial including use of narcotic pain medications in the setting of renal failure.  Discussed with family and nursing to minimize use of narcotics as much as possible and try Tylenol instead. ?No acute indication for dialysis at present but if confusion continues and may be a manifestation of uremia therefore we might have to start hemodialysis.  For now, continue to  monitor closely. ? ?Lab Results  ?Component Value Date  ? CREATININE 5.67 (H) 03/25/2021  ? CREATININE 5.41 (H) 03/24/2021  ? CREATININE 4.89 (H) 03/23/2021  ? ? ?Intake/Output Summary (Last 24 hours) at 03/25/2021 1358 ?Last data filed at 03/25/2021 1023 ?Gross per 24 hour  ?Intake 600 ml  ?Output 1490 ml  ?Net -890 ml  ? ? ?2. Anemia of chronic kidney disease ?Normocytic ?Lab Results  ?Component Value Date  ? HGB 7.1 (L) 03/25/2021  ?  ?Hgb decreased.  ?Transferrin saturation 22%. ?ESA/Aranesp started - to be administered subcu Saturdays. ? ?3. Secondary Hyperparathyroidism: ?Lab Results  ?Component Value Date  ? CALCIUM 8.0 (L) 03/25/2021  ? CAION 1.17 03/21/2021  ? PHOS 6.6 (H) 03/25/2021  ?  ?Monitoring bone minerals during this admission.  Continue cholecalciferol.  ? ?  4.  Urinary retention-Foley catheter has been placed ? ?5.  Hypertension ?Blood pressure in the 160s ?Regimen includes Coreg 3.125 mg twice a day ?Monitor closely for now.    ?We will consider low dose amlodipine if blood pressure stays high. ? ? ? LOS: 2 ?Dajanay Northrup ?3/25/20231:58 PM ?  ?Case discussed with patient and his wife at bedside. ?

## 2021-03-25 NOTE — Progress Notes (Addendum)
? ?Subjective: ?4 Days Post-Op Procedure(s) (LRB): ?TOTAL KNEE ARTHROPLASTY (Right) ?Patient reports pain as mild.   ?Patient is doing well with no complaints.  ?Denies any CP, SOB, ABD pain. ?We will continue therapy today.  ?Plan is to go Home after hospital stay. ? ?Objective: ?Vital signs in last 24 hours: ?Temp:  [97.5 ?F (36.4 ?C)-98 ?F (36.7 ?C)] 98 ?F (36.7 ?C) (03/25 0436) ?Pulse Rate:  [58-81] 69 (03/25 0436) ?Resp:  [16-20] 18 (03/25 0436) ?BP: (137-155)/(76-84) 144/76 (03/25 0436) ?SpO2:  [99 %-100 %] 99 % (03/25 0436) ? ?Intake/Output from previous day: ?03/24 0701 - 03/25 0700 ?In: 720 [P.O.:720] ?Out: 1015 [[id number]:[id number]] ?Intake/Output this shift: ?No intake/output data recorded. ? ?Recent Labs  ?  03/23/21 ?0320 03/24/21 ?0718  ?HGB 7.6* 7.2*  ? ?Recent Labs  ?  03/23/21 ?0320 03/24/21 ?0718  ?WBC 6.5 6.6  ?RBC 2.32* 2.20*  ?HCT 22.9* 21.9*  ?PLT 172 147*  ? ?Recent Labs  ?  03/23/21 ?0320 03/24/21 ?0718  ?NA 134* 134*  ?K 4.8 4.4  ?CL 105 105  ?CO2 21* 20*  ?BUN 67* 74*  ?CREATININE 4.89* 5.41*  ?GLUCOSE 104* 101*  ?CALCIUM 8.0* 8.0*  ? ?No results for input(s): LABPT, INR in the last 72 hours. ? ?EXAM ?General - Patient is Alert, Appropriate, and Oriented ?Extremity - Neurovascular intact ?Sensation intact distally ?Intact pulses distally ?Dorsiflexion/Plantar flexion intact ?No cellulitis present ?Compartment soft ?Dressing - dressing C/D/I and no drainage, provena intact with out drainage ?Motor Function - intact, moving foot and toes well on exam.  Ambulated 120 feet with physical therapy. ? ?Past Medical History:  ?Diagnosis Date  ? Aortic atherosclerosis ([place])   ? Arthritis   ? Atrial fibrillation and flutter ([place])   ? a.) CHA2DS2-VASc = 5 (age x 2, CHF, HTN, aortic plaque). b.) rate/rhythm maintain on oral carvedilol; chronically anticoagulated with dose reduced apixaban  ? BPH (benign prostatic hyperplasia)   ? Cardiac syncope   ? Cardiomyopathy ([place])   ? a.) TTE 04/23/2016: EF 40%. b.) TTE  09/03/2016: EF 50%. c.) TTE 05/04/2019: EF >55%.  ? CHF (congestive heart failure) ([place])   ? a.) TTE 04/23/2016: EF 40%; BAE; mild AR/TR, mod MR; G1DD. b.) TTE 09/03/2016: EF 50%; mild LVH; BAE; triv AR/PR, mod MR/TR. c.) TTE 05/04/2019: EF >55%; mod LVH, mild LA enlargement; mild AR/MR/TR  ? CKD (chronic kidney disease), stage IV ([place])   ? Complication of anesthesia   ? a.) h/o intra/postoperative A.fib  ? Coronary artery disease   ? Diverticulosis   ? Gout   ? History of 2019 novel coronavirus disease (COVID-19) 11/14/2020  ? History of colon polyps   ? Hypercholesteremia   ? Hypertension   ? Long term current use of anticoagulant   ? a.) dose reduced apixaban; dose reduction d/t to age and CKD Dx.  ? 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 a.) s/p resection  ? MGUS (monoclonal gammopathy of unknown significance)   ? Pleural effusion   ? Prostatism   ? Rectal bleeding   ? ? ?Assessment/Plan:   ?4 Days Post-Op Procedure(s) (LRB): ?TOTAL KNEE ARTHROPLASTY (Right) ?Principal Problem: ?  S/P TKR (total knee replacement) using cement, right ? ?Estimated body mass index is 27.4 kg/m? as calculated from the following: ?  Height as of this encounter: '5\' 11"'$  (1.803 m). ?  Weight as of this encounter: 89.1 kg. ?Advance diet ?Up with therapy ?Pain controlled ?Nausea resolved ?Hyperkalemia - Resolved ?Acute renal insufficiency - Appreciate  Nephrology input.  In and out catheterization in the night. ?Acute post op blood loss anemia with hemoglobin 7.2.  Slowly trending down.- Continue with Iron supplement ? ?CM to assist with discharge to home with HHPT, patient making good progress with PT ? ? ?DVT Prophylaxis - TED hose and SCDs Eliquis ?Weight-Bearing as tolerated to right leg ? ?Reche Dixon, PA-C ?[place] ?03/25/2021, 7:09 AM ? ?Has not voided, Cr and BUN elevated ?Cancel Discharge until medically stable ?  ?

## 2021-03-25 NOTE — Progress Notes (Signed)
Discharge orders noted.  ?Patient will need close outpatient f/u with [facility] nephrology to manage AKI ?- Follow up with urology for urinary retention and foley ?

## 2021-03-25 NOTE — Progress Notes (Addendum)
Pt unable to void; bladder scanned 667m; followed post removal urinary retention guidelines from standing order. Notified MD on call; I/O cath; pt tolerated well. orders received to reinsert foley catheter.  ?

## 2021-03-25 NOTE — Progress Notes (Addendum)
1739 ?Pt appears to be a little more SOB while lying in bed than this morning and now wheezing slightly. IV fluids were decreased earlier to 24m/hr and using tylenol instead of narcotics at this time. Dr SCandiss Norseinformed awaiting response.  ? ?1750 ?Verbal orders to d/c IV fluids ?

## 2021-03-25 NOTE — Progress Notes (Signed)
Pt unable to void; bladder scanned 341m; MD notified to reinsert foley; NT attempted foley; meeting resistance; I attempted coude cath; pt tolerated well.  ?

## 2021-03-25 NOTE — Progress Notes (Signed)
Physical Therapy Treatment ?Patient Details ?Name: Lawrence Perkins. ?MRN: [id number] ?DOB: 1940/10/28 ?Today's Date: 03/25/2021 ? ? ?History of Present Illness Pt is a 81 y.o. male s/p elective R TKA. PMH includes: CAD, cardiomyopathy, CHF, atrial fibrillation, aortic atherosclerosis, cardiac syncope, CKD-IV, HTN, HLD, OA, BPH. ? ?  ?PT Comments  ? ? Pt received in Semi-Fowler's position and agreeable to therapy.  Pt's wife noted that the pt was much more anxious and ready to get up this PM session.  Pt performed bed mobility without any difficulty.  Pt did struggled with STS, requiring elevated surface and x2 attempts to come upright into standing position.  Once upright, pt noted increased pain in R LE and noted he was likely not going to be able to ambulate this session.  Pt given encouragement and he continued with RW and was able to ambulate 50 feet before returning to bed.  Pt did have hallucinations of "puddles of water" in the hallway and would try to dodge those when ambulating.  Pt with decreased safety awareness when performing the act of "dodging" the puddles as well.  Pt then returned to bed with all needs met and wife in room.  Call bell within reach of pt.  Nursing and attending MD notified of hallucinations and increase in pain via secure chat.  Current discharge plans to home with HHPT remain appropriate at this time.  If continued functional decline, pt may need require increased care/updated therapy recommendations.  Pt will continue to benefit from skilled therapy in order to address deficits listed below. ? ?  ?Recommendations for follow up therapy are one component of a multi-disciplinary discharge planning process, led by the attending physician.  Recommendations may be updated based on patient status, additional functional criteria and insurance authorization. ? ?Follow Up Recommendations ? Home health PT ?  ?  ?Assistance Recommended at Discharge Intermittent Supervision/Assistance   ?Patient can return home with the following A little help with walking and/or transfers;Assistance with cooking/housework;Assist for transportation;Help with stairs or ramp for entrance ?  ?Equipment Recommendations ? None recommended by PT  ?  ?Recommendations for Other Services   ? ? ?  ?Precautions / Restrictions Precautions ?Precautions: Fall;Knee ?Precaution Booklet Issued: Yes (comment) ?Restrictions ?Weight Bearing Restrictions: Yes ?RLE Weight Bearing: Weight bearing as tolerated  ?  ? ?Mobility ? Bed Mobility ?Overal bed mobility: Needs Assistance ?Bed Mobility: Supine to Sit ?  ?  ?Supine to sit: HOB elevated, Min guard ?  ?  ?General bed mobility comments: Pt able to safely bring legs out of bed prior to attempting transfer into standing. ?Patient Response: Impulsive ? ?Transfers ?Overall transfer level: Needs assistance ?Equipment used: Rolling walker (2 wheels) ?Transfers: Sit to/from Stand ?Sit to Stand: Supervision ?  ?  ?  ?  ?  ?General transfer comment: no physical assistance to stand from EOB; however took 2 attempts before he could come upright due to pain. ?  ? ?Ambulation/Gait ?Ambulation/Gait assistance: Supervision ?Gait Distance (Feet): 50 Feet ?Assistive device: Rolling walker (2 wheels) ?Gait Pattern/deviations: Step-through pattern, Antalgic, Trunk flexed ?Gait velocity: decreased ?  ?  ?General Gait Details: pt able to ambulate 50 ft with SOB.  Pt's oxygen vitals were taken, but inconsistent reading were noted during PM session. ? ? ?Stairs ?  ?  ?  ?  ?  ? ? ?Wheelchair Mobility ?  ? ?Modified Rankin (Stroke Patients Only) ?  ? ? ?  ?Balance Overall balance assessment: Needs assistance ?Sitting-balance support: Feet  supported ?Sitting balance-Leahy Scale: Good ?  ?  ?Standing balance support: Bilateral upper extremity supported, During functional activity, Reliant on assistive device for balance ?Standing balance-Leahy Scale: Good ?  ?  ?  ?  ?  ?  ?  ?  ?  ?  ?  ?  ?  ? ?  ?Cognition  Arousal/Alertness: Awake/alert ?Behavior During Therapy: [facility] for tasks assessed/performed ?Overall Cognitive Status: Impaired/Different from baseline ?Area of Impairment: Awareness, Attention ?  ?  ?  ?  ?  ?  ?  ?  ?  ?  ?  ?  ?  ?  ?  ?General Comments: Pt with noted hallucinations of puddles on the floor with ambulation and at times, talking out of his mind. ?  ?  ? ?  ?Exercises   ? ?  ?General Comments   ?  ?  ? ?Pertinent Vitals/Pain Pain Assessment ?Pain Assessment: 0-10 ?Pain Score: 10-Worst pain ever ?Faces Pain Scale: Hurts little more ?Pain Location: R knee ?Pain Descriptors / Indicators: Discomfort, Grimacing, Guarding ?Pain Intervention(s): Limited activity within patient's tolerance, Monitored during session, Repositioned  ? ? ?Home Living   ?  ?  ?  ?  ?  ?  ?  ?  ?  ?   ?  ?Prior Function    ?  ?  ?   ? ?PT Goals (current goals can now be found in the care plan section) Acute Rehab PT Goals ?Patient Stated Goal: go home ?PT Goal Formulation: With patient ?Time For Goal Achievement: 04/04/21 ?Potential to Achieve Goals: Good ?Progress towards PT goals: Progressing toward goals ? ?  ?Frequency ? ? ? BID ? ? ? ?  ?PT Plan Current plan remains appropriate  ? ? ?Co-evaluation   ?  ?  ?  ?  ? ?  ?AM-PAC PT "6 Clicks" Mobility   ?Outcome Measure ? Help needed turning from your back to your side while in a flat bed without using bedrails?: A Little ?Help needed moving from lying on your back to sitting on the side of a flat bed without using bedrails?: A Little ?Help needed moving to and from a bed to a chair (including a wheelchair)?: A Little ?Help needed standing up from a chair using your arms (e.g., wheelchair or bedside chair)?: A Little ?Help needed to walk in hospital room?: A Little ?Help needed climbing 3-5 steps with a railing? : A Little ?6 Click Score: 18 ? ?  ?End of Session   ?Activity Tolerance: Patient limited by pain ?Patient left: with call bell/phone within reach;with family/visitor  present;in bed;with bed alarm set ?Nurse Communication: Mobility status ?PT Visit Diagnosis: Other abnormalities of gait and mobility (R26.89);Muscle weakness (generalized) (M62.81);Difficulty in walking, not elsewhere classified (R26.2);Pain ?Pain - Right/Left: Right ?Pain - part of body: Knee ?  ? ? ?Time: 6759-1638 ?PT Time Calculation (min) (ACUTE ONLY): 29 min ? ?Charges:  $Gait Training: 23-37 mins          ?          ? ?Gwenlyn Saran, PT, DPT ?03/25/21, 3:51 PM ? ? ? ?Christie Nottingham ?03/25/2021, 3:44 PM ? ?

## 2021-03-25 NOTE — Progress Notes (Signed)
Physical Therapy Treatment ?Patient Details ?Name: Lawrence Perkins. ?MRN: [id number] ?DOB: January 11, 1940 ?Today's Date: 03/25/2021 ? ? ?History of Present Illness Pt is a 81 y.o. male s/p elective R TKA. PMH includes: CAD, cardiomyopathy, CHF, atrial fibrillation, aortic atherosclerosis, cardiac syncope, CKD-IV, HTN, HLD, OA, BPH. ? ?  ?PT Comments  ? ? Pt received in supine position and agreeable to therapy.  Pt able to perform transfer training well and come upright with no complications.  Pt then ambulated around the nursing station with one seated rest break during loop around the station.  Pt noted to be experiencing SOB, but O2 saturations were WNL (>92% throughout.  Pt then ambulated back to room with same symptom response.  Nursing notified and pt was left in recliner with wife present in room upon leaving.  Current discharge plans to home with HHPT remain appropriate at this time.  Pt will continue to benefit from skilled therapy in order to address deficits listed below. ? ?   ?Recommendations for follow up therapy are one component of a multi-disciplinary discharge planning process, led by the attending physician.  Recommendations may be updated based on patient status, additional functional criteria and insurance authorization. ? ?Follow Up Recommendations ? Home health PT ?  ?  ?Assistance Recommended at Discharge Intermittent Supervision/Assistance  ?Patient can return home with the following A little help with walking and/or transfers;Assistance with cooking/housework;Assist for transportation;Help with stairs or ramp for entrance ?  ?Equipment Recommendations ? None recommended by PT  ?  ?Recommendations for Other Services   ? ? ?  ?Precautions / Restrictions Precautions ?Precautions: Fall;Knee ?Precaution Booklet Issued: Yes (comment) ?Restrictions ?Weight Bearing Restrictions: Yes ?RLE Weight Bearing: Weight bearing as tolerated  ?  ? ?Mobility ? Bed Mobility ?Overal bed mobility: Needs  Assistance ?Bed Mobility: Supine to Sit ?  ?  ?Supine to sit: HOB elevated, Min guard ?  ?  ?General bed mobility comments: Pt able to safely bring legs out of bed prior to attempting transfer into standing. ?  ? ?Transfers ?Overall transfer level: Needs assistance ?Equipment used: Rolling walker (2 wheels) ?Transfers: Sit to/from Stand ?Sit to Stand: Supervision ?  ?  ?  ?  ?  ?General transfer comment: no physical assistance to stand from EOB ?  ? ?Ambulation/Gait ?Ambulation/Gait assistance: Supervision ?Gait Distance (Feet): 1180 Feet ?Assistive device: Rolling walker (2 wheels) ?Gait Pattern/deviations: Step-through pattern, Antalgic, Trunk flexed ?Gait velocity: decreased ?  ?  ?General Gait Details: pt able to ambulate 180 ft with one seated rest break due to SOB.  Pt's oxygen vitals were taken, but found to be WNL (>92% of O2 saturation, HR 90) ? ? ?Stairs ?  ?  ?  ?  ?  ? ? ?Wheelchair Mobility ?  ? ?Modified Rankin (Stroke Patients Only) ?  ? ? ?  ?Balance Overall balance assessment: Needs assistance ?Sitting-balance support: Feet supported ?Sitting balance-Leahy Scale: Good ?  ?  ?Standing balance support: Bilateral upper extremity supported, During functional activity, Reliant on assistive device for balance ?Standing balance-Leahy Scale: Good ?  ?  ?  ?  ?  ?  ?  ?  ?  ?  ?  ?  ?  ? ?  ?Cognition Arousal/Alertness: Awake/alert ?Behavior During Therapy: [facility] for tasks assessed/performed ?Overall Cognitive Status: Within Functional Limits for tasks assessed ?  ?  ?  ?  ?  ?  ?  ?  ?  ?  ?  ?  ?  ?  ?  ?  ?  General Comments: Pt is A and O x 4 ?  ?  ? ?  ?Exercises   ? ?  ?General Comments   ?  ?  ? ?Pertinent Vitals/Pain Pain Assessment ?Pain Assessment: Faces ?Faces Pain Scale: Hurts little more ?Pain Location: R knee ?Pain Descriptors / Indicators: Discomfort, Grimacing, Guarding ?Pain Intervention(s): Limited activity within patient's tolerance, Monitored during session, Premedicated before session,  Repositioned  ? ? ?Home Living   ?  ?  ?  ?  ?  ?  ?  ?  ?  ?   ?  ?Prior Function    ?  ?  ?   ? ?PT Goals (current goals can now be found in the care plan section) Acute Rehab PT Goals ?Patient Stated Goal: go home ?PT Goal Formulation: With patient ?Time For Goal Achievement: 04/04/21 ?Potential to Achieve Goals: Good ?Progress towards PT goals: Progressing toward goals ? ?  ?Frequency ? ? ? BID ? ? ? ?  ?PT Plan Current plan remains appropriate  ? ? ?Co-evaluation   ?  ?  ?  ?  ? ?  ?AM-PAC PT "6 Clicks" Mobility   ?Outcome Measure ? Help needed turning from your back to your side while in a flat bed without using bedrails?: A Little ?Help needed moving from lying on your back to sitting on the side of a flat bed without using bedrails?: A Little ?Help needed moving to and from a bed to a chair (including a wheelchair)?: A Little ?Help needed standing up from a chair using your arms (e.g., wheelchair or bedside chair)?: A Little ?Help needed to walk in hospital room?: A Little ?Help needed climbing 3-5 steps with a railing? : A Little ?6 Click Score: 18 ? ?  ?End of Session   ?Activity Tolerance: Patient tolerated treatment well ?Patient left: with call bell/phone within reach;in chair;with chair alarm set;with family/visitor present ?Nurse Communication: Mobility status ?PT Visit Diagnosis: Other abnormalities of gait and mobility (R26.89);Muscle weakness (generalized) (M62.81);Difficulty in walking, not elsewhere classified (R26.2);Pain ?Pain - Right/Left: Right ?Pain - part of body: Knee ?  ? ? ?Time: 1916-6060 ?PT Time Calculation (min) (ACUTE ONLY): 28 min ? ?Charges:  $Gait Training: 23-37 mins          ?          ? ?Gwenlyn Saran, PT, DPT ?03/25/21, 12:25 PM ? ? ? ?Christie Nottingham ?03/25/2021, 12:23 PM ? ?

## 2021-03-26 LAB — CBC
HCT: 19.8 % — ABNORMAL LOW (ref 39.0–52.0)
Hemoglobin: 6.6 g/dL — ABNORMAL LOW (ref 13.0–17.0)
MCH: 32.8 pg (ref 26.0–34.0)
MCHC: 33.3 g/dL (ref 30.0–36.0)
MCV: 98.5 fL (ref 80.0–100.0)
Platelets: 182 10*3/uL (ref 150–400)
RBC: 2.01 MIL/uL — ABNORMAL LOW (ref 4.22–5.81)
RDW: 12.5 % (ref 11.5–15.5)
WBC: 7.2 10*3/uL (ref 4.0–10.5)
nRBC: 0 % (ref 0.0–0.2)

## 2021-03-26 LAB — BASIC METABOLIC PANEL
Anion gap: 6 (ref 5–15)
BUN: 89 mg/dL — ABNORMAL HIGH (ref 8–23)
CO2: 21 mmol/L — ABNORMAL LOW (ref 22–32)
Calcium: 8.4 mg/dL — ABNORMAL LOW (ref 8.9–10.3)
Chloride: 102 mmol/L (ref 98–111)
Creatinine, Ser: 5.88 mg/dL — ABNORMAL HIGH (ref 0.61–1.24)
GFR, Estimated: 9 mL/min — ABNORMAL LOW (ref >60)
Glucose, Bld: 90 mg/dL (ref 70–99)
Potassium: 4.7 mmol/L (ref 3.5–5.1)
Sodium: 129 mmol/L — ABNORMAL LOW (ref 135–145)

## 2021-03-26 LAB — HEMOGLOBIN AND HEMATOCRIT, BLOOD
HCT: 22.3 % — ABNORMAL LOW (ref 39.0–52.0)
Hemoglobin: 7.6 g/dL — ABNORMAL LOW (ref 13.0–17.0)

## 2021-03-26 LAB — PREPARE RBC (CROSSMATCH)

## 2021-03-26 MED ORDER — SODIUM CHLORIDE 0.9% IV SOLUTION: Freq: Once | INTRAVENOUS | Status: DC

## 2021-03-26 MED ORDER — ALBUTEROL SULFATE (2.5 MG/3ML) 0.083% IN NEBU
2.5000 mg | INHALATION_SOLUTION | RESPIRATORY_TRACT | Status: DC | PRN
  Administered 2021-03-26 (×2): 2.5 mg via RESPIRATORY_TRACT
  Filled 2021-03-26 (×2): qty 3

## 2021-03-26 NOTE — H&P (View-Only) (Signed)
Lawrence Perkins  ? ?ASSESSMENT / PLAN: ?81 y.o. male with acute kidney injury on chronic kidney disease, stage IV. Patient likely to need dialysis going forward. Vascular surgery asked to place tunneled dialysis catheter tomorrow. Reviewed this with the patient and family. Keep NPO after midnight.  ? ?CHIEF COMPLAINT: worsening renal function ? ?HISTORY OF PRESENT ILLNESS: ?Lawrence Hettich. is a 81 y.o. male admitted to the internal medicine service for postoperative care after a total knee replacement for osteoarthritis. The patient is chronically ill with history including CAD, AF on Eliquis, HFrEF (40%), CKD IV, HLA. The patient has had deterioration of his renal function during this admission and needs to initiate dialysis. He has never had a tunneled catheter, port, or pacemaker before.  ? ?Past Medical History:  ?Diagnosis Date  ? Aortic atherosclerosis ([place])   ? Arthritis   ? Atrial fibrillation and flutter ([place])   ? a.) CHA2DS2-VASc = 5 (age x 2, CHF, HTN, aortic plaque). b.) rate/rhythm maintain on oral carvedilol; chronically anticoagulated with dose reduced apixaban  ? BPH (benign prostatic hyperplasia)   ? Cardiac syncope   ? Cardiomyopathy ([place])   ? a.) TTE 04/23/2016: EF 40%. b.) TTE 09/03/2016: EF 50%. c.) TTE 05/04/2019: EF >55%.  ? CHF (congestive heart failure) ([place])   ? a.) TTE 04/23/2016: EF 40%; BAE; mild AR/TR, mod MR; G1DD. b.) TTE 09/03/2016: EF 50%; mild LVH; BAE; triv AR/PR, mod MR/TR. c.) TTE 05/04/2019: EF >55%; mod LVH, mild LA enlargement; mild AR/MR/TR  ? CKD (chronic kidney disease), stage IV ([place])   ? Complication of anesthesia   ? a.) h/o intra/postoperative A.fib  ? Coronary artery disease   ? Diverticulosis   ? Gout   ? History of 2019 novel coronavirus disease (COVID-19) 11/14/2020  ? History of colon polyps   ? Hypercholesteremia   ? Hypertension   ? Long term current use of anticoagulant   ? a.) dose reduced apixaban; dose reduction d/t to age and CKD  Dx.  ? 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 a.) s/p resection  ? MGUS (monoclonal gammopathy of unknown significance)   ? Pleural effusion   ? Prostatism   ? Rectal bleeding   ? ? ?Past Surgical History:  ?Procedure Laterality Date  ? ANKLE SURGERY    ? following volleyball injury  ? APPENDECTOMY    ? COLONOSCOPY    ? COLONOSCOPY WITH PROPOFOL N/A 01/02/2017  ? Procedure: COLONOSCOPY WITH PROPOFOL;  Surgeon: Manya Silvas, MD;  Location: [facility] ENDOSCOPY;  Service: Endoscopy;  Laterality: N/A;  ? JOINT REPLACEMENT    ? RIGHT TOTAL HIP  ? TONSILLECTOMY    ? TOTAL HIP ARTHROPLASTY  09/2009  ? Right femoral neck fracture  ? TOTAL HIP ARTHROPLASTY Left 03/06/2016  ? Procedure: TOTAL HIP ARTHROPLASTY ANTERIOR APPROACH;  Surgeon: Hessie Knows, MD;  Location: [facility] ORS;  Service: Orthopedics;  Laterality: Left;  ? TOTAL KNEE ARTHROPLASTY Right 03/21/2021  ? Procedure: TOTAL KNEE ARTHROPLASTY;  Surgeon: Hessie Knows, MD;  Location: [facility] ORS;  Service: Orthopedics;  Laterality: Right;  ? ? ?Family History  ?Problem Relation Age of Onset  ? Cirrhosis Mother   ? Other Father   ?     Lung Fibrosis  ? Prostate cancer Neg Hx   ? Kidney cancer Neg Hx   ? Bladder Cancer Neg Hx   ? ? ?Social History  ? ?Socioeconomic History  ? Marital status: Married  ?  Spouse name: [name]  ?  Number of children: Not on file  ? Years of education: Not on file  ? Highest education level: Not on file  ?Occupational History  ? Occupation: Retired  ?Tobacco Use  ? Smoking status: Former  ?  Years: 15.00  ?  Types: Cigarettes  ?  Quit date: 01/01/1981  ?  Years since quitting: 40.2  ? Smokeless tobacco: Never  ?Vaping Use  ? Vaping Use: Never used  ?Substance and Sexual Activity  ? Alcohol use: Not Currently  ?  Alcohol/week: 2.0 standard drinks  ?  Types: 2 Cans of beer per week  ? Drug use: No  ? Sexual activity: Yes  ?  Birth control/protection: None  ?Other Topics Concern  ? Not on file  ?Social History Narrative  ? Regular exercise: Yes  ? ?Social Determinants of  Health  ? ?Financial Resource Strain: Not on file  ?Food Insecurity: Not on file  ?Transportation Needs: Not on file  ?Physical Activity: Not on file  ?Stress: Not on file  ?Social Connections: Not on file  ?Intimate Partner Violence: Not on file  ? ? ?No Known Allergies ? ?Current Facility-Administered Medications  ?Medication Dose Route Frequency Provider Last Rate Last Admin  ? 0.9 %  sodium chloride infusion (Manually program via Guardrails IV Fluids)   Intravenous Once Reche Dixon, PA-C      ? acetaminophen (TYLENOL) tablet 325-650 mg  325-650 mg Oral Q6H PRN Hessie Knows, MD      ? alum & mag hydroxide-simeth (MAALOX/MYLANTA) 200-200-20 MG/5ML suspension 30 mL  30 mL Oral Q4H PRN Hessie Knows, MD      ? apixaban Arne Cleveland) tablet 2.5 mg  2.5 mg Oral BID Hessie Knows, MD   2.5 mg at 03/26/21 3329  ? bisacodyl (DULCOLAX) EC tablet 5 mg  5 mg Oral Daily PRN Hessie Knows, MD   5 mg at 03/25/21 1103  ? bisacodyl (DULCOLAX) suppository 10 mg  10 mg Rectal Daily PRN Hessie Knows, MD   10 mg at 03/25/21 1708  ? carvedilol (COREG) tablet 3.125 mg  3.125 mg Oral BID WC Hessie Knows, MD   3.125 mg at 03/26/21 5188  ? Chlorhexidine Gluconate Cloth 2 % PADS 6 each  6 each Topical Daily Hessie Knows, MD      ? cholecalciferol (VITAMIN D3) tablet 1,000 Units  1,000 Units Oral Daily Hessie Knows, MD   1,000 Units at 03/26/21 4166  ? Darbepoetin Alfa (ARANESP) injection 100 mcg  100 mcg Subcutaneous Q Sat-1800 Murlean Iba, MD   100 mcg at 03/25/21 1943  ? diphenhydrAMINE (BENADRYL) 12.5 MG/5ML elixir 12.5-25 mg  12.5-25 mg Oral Q4H PRN Hessie Knows, MD      ? docusate sodium (COLACE) capsule 100 mg  100 mg Oral BID Hessie Knows, MD   100 mg at 03/26/21 0630  ? ferrous [id number] C-folic acid (TRINSICON / FOLTRIN) capsule 1 capsule  1 capsule Oral BID PC Hessie Knows, MD   1 capsule at 03/26/21 0955  ? HYDROcodone-acetaminophen (NORCO) 7.5-325 MG per tablet 1-2 tablet  1-2 tablet Oral Q4H PRN Hessie Knows, MD   1 tablet at 03/24/21 1041  ? HYDROcodone-acetaminophen (NORCO/VICODIN) 5-325 MG per tablet 1-2 tablet  1-2 tablet Oral Q4H PRN Hessie Knows, MD   1 tablet at 03/23/21 1603  ? hydrocortisone cream 1 % 1 application.  1 application. Topical BID PRN Hessie Knows, MD      ? menthol-cetylpyridinium (CEPACOL) lozenge 3 mg  1 lozenge Oral PRN Hessie Knows, MD      ?  Or  ? phenol (CHLORASEPTIC) mouth spray 1 spray  1 spray Mouth/Throat PRN Hessie Knows, MD      ? methocarbamol (ROBAXIN) tablet 500 mg  500 mg Oral Q6H PRN Hessie Knows, MD   500 mg at 03/21/21 2116  ? Or  ? methocarbamol (ROBAXIN) 500 mg in dextrose 5 % 50 mL IVPB  500 mg Intravenous Q6H PRN Hessie Knows, MD      ? metoCLOPramide (REGLAN) tablet 5-10 mg  5-10 mg Oral Q8H PRN Hessie Knows, MD      ? Or  ? metoCLOPramide (REGLAN) injection 5-10 mg  5-10 mg Intravenous Q8H PRN Hessie Knows, MD      ? morphine (PF) 2 MG/ML injection 0.5-1 mg  0.5-1 mg Intravenous Q2H PRN Hessie Knows, MD   1 mg at 03/21/21 1241  ? multivitamin with minerals tablet 1 tablet  1 tablet Oral Daily Hessie Knows, MD   1 tablet at 03/26/21 1368  ? ondansetron (ZOFRAN) tablet 4 mg  4 mg Oral Q6H PRN Hessie Knows, MD   4 mg at 03/23/21 0504  ? Or  ? ondansetron (ZOFRAN) injection 4 mg  4 mg Intravenous Q6H PRN Hessie Knows, MD   4 mg at 03/22/21 1620  ? pantoprazole (PROTONIX) EC tablet 40 mg  40 mg Oral Daily Hessie Knows, MD   40 mg at 03/26/21 0955  ? polyethylene glycol (MIRALAX / GLYCOLAX) packet 17 g  17 g Oral Daily PRN Hessie Knows, MD      ? rosuvastatin (CRESTOR) tablet 10 mg  10 mg Oral QHS Hessie Knows, MD   10 mg at 03/25/21 2016  ? scopolamine (TRANSDERM-SCOP) 1 MG/3DAYS 1.5 mg  1 patch Transdermal Q72H Hessie Knows, MD   1.5 mg at 03/24/21 1635  ? tamsulosin (FLOMAX) capsule 0.4 mg  0.4 mg Oral Daily Hessie Knows, MD   0.4 mg at 03/26/21 5992  ? traMADol (ULTRAM) tablet 50 mg  50 mg Oral Q6H Hessie Knows, MD   50 mg at 03/26/21 3414  ?  zolpidem (AMBIEN) tablet 5 mg  5 mg Oral QHS PRN Hessie Knows, MD      ? ? ?PHYSICAL EXAM ?Vitals:  ? 03/26/21 0536 03/26/21 4360 03/26/21 1658 03/26/21 0932  ?BP: (!) 176/84 (!) 165/76 (!) 143/84 (!) 150/79  ?

## 2021-03-26 NOTE — Progress Notes (Signed)
? ?Subjective: ?5 Days Post-Op Procedure(s) (LRB): ?TOTAL KNEE ARTHROPLASTY (Right) ?Patient reports pain as mild.   ?Patient is doing well with no complaints.  ?Denies any CP, SOB, ABD pain. ?We will continue therapy today.  ?Plan is to go Home after hospital stay. ? ?Objective: ?Vital signs in last 24 hours: ?Temp:  [97.6 ?F (36.4 ?C)-98.2 ?F (36.8 ?C)] 97.7 ?F (36.5 ?C) (03/26 0536) ?Pulse Rate:  [68-79] 76 (03/26 0536) ?Resp:  [18-20] 20 (03/26 0536) ?BP: (153-176)/(80-95) 176/84 (03/26 0536) ?SpO2:  [97 %-99 %] 97 % (03/26 0536) ? ?Intake/Output from previous day: ?03/25 0701 - 03/26 0700 ?In: 69 [P.O.:490] ?Out: 2325 [Urine:2325] ?Intake/Output this shift: ?No intake/output data recorded. ? ?Recent Labs  ?  03/24/21 ?0718 03/25/21 ?0950 03/26/21 ?0437  ?HGB 7.2* 7.1* 6.6*  ? ?Recent Labs  ?  03/25/21 ?0950 03/26/21 ?4825  ?WBC 7.1 7.2  ?RBC 2.17* 2.01*  ?HCT 22.1* 19.8*  ?PLT 166 182  ? ?Recent Labs  ?  03/25/21 ?0950 03/26/21 ?0037  ?NA 129* 129*  ?K 4.7 4.7  ?CL 102 102  ?CO2 16* 21*  ?BUN 80* 89*  ?CREATININE 5.67* 5.88*  ?GLUCOSE 104* 90  ?CALCIUM 8.0* 8.4*  ? ?No results for input(s): LABPT, INR in the last 72 hours. ? ?EXAM ?General - Patient is Alert, Appropriate, and Oriented ?Extremity - Neurovascular intact ?Sensation intact distally ?Intact pulses distally ?Dorsiflexion/Plantar flexion intact ?No cellulitis present ?Compartment soft ?Dressing - dressing C/D/I and no drainage, provena intact with out drainage ?Motor Function - intact, moving foot and toes well on exam.  Ambulated 50 feet with physical therapy. ? ?Past Medical History:  ?Diagnosis Date  ? Aortic atherosclerosis ([place])   ? Arthritis   ? Atrial fibrillation and flutter ([place])   ? a.) CHA2DS2-VASc = 5 (age x 2, CHF, HTN, aortic plaque). b.) rate/rhythm maintain on oral carvedilol; chronically anticoagulated with dose reduced apixaban  ? BPH (benign prostatic hyperplasia)   ? Cardiac syncope   ? Cardiomyopathy ([place])   ? a.) TTE  04/23/2016: EF 40%. b.) TTE 09/03/2016: EF 50%. c.) TTE 05/04/2019: EF >55%.  ? CHF (congestive heart failure) ([place])   ? a.) TTE 04/23/2016: EF 40%; BAE; mild AR/TR, mod MR; G1DD. b.) TTE 09/03/2016: EF 50%; mild LVH; BAE; triv AR/PR, mod MR/TR. c.) TTE 05/04/2019: EF >55%; mod LVH, mild LA enlargement; mild AR/MR/TR  ? CKD (chronic kidney disease), stage IV ([place])   ? Complication of anesthesia   ? a.) h/o intra/postoperative A.fib  ? Coronary artery disease   ? Diverticulosis   ? Gout   ? History of 2019 novel coronavirus disease (COVID-19) 11/14/2020  ? History of colon polyps   ? Hypercholesteremia   ? Hypertension   ? Long term current use of anticoagulant   ? a.) dose reduced apixaban; dose reduction d/t to age and CKD Dx.  ? 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 a.) s/p resection  ? MGUS (monoclonal gammopathy of unknown significance)   ? Pleural effusion   ? Prostatism   ? Rectal bleeding   ? ? ?Assessment/Plan:   ?5 Days Post-Op Procedure(s) (LRB): ?TOTAL KNEE ARTHROPLASTY (Right) ?Principal Problem: ?  S/P TKR (total knee replacement) using cement, right ? ?Estimated body mass index is 27.4 kg/m? as calculated from the following: ?  Height as of this encounter: '5\' 11"'$  (1.803 m). ?  Weight as of this encounter: 89.1 kg. ?Advance diet ?Up with therapy ?Pain controlled ?Nausea resolved ?Hyperkalemia - Resolved ?Acute renal  insufficiency - Appreciate Nephrology input.  In and out catheterization in the night. ?Acute post op blood loss anemia with hemoglobin 6.6.  1 unit of transfused blood ordered.  Recheck hemoglobin.  Continue with Iron supplement ? ?CM to assist with discharge to home with HHPT after the transfusion, patient making good progress with PT ? ? ?DVT Prophylaxis - TED hose and SCDs Eliquis ?Weight-Bearing as tolerated to right leg ? ?Reche Dixon, PA-C ?[place] ?03/26/2021, 7:33 AM ? ?Has not voided, Cr and BUN elevated ?Cancel Discharge until medically stable ?  ?

## 2021-03-26 NOTE — Progress Notes (Signed)
?[facility]  ?ROUNDING NOTE  ? ?Subjective:  ? ?Patient seen with wife in the room.  Overall doing fair.  Ate small portion of breakfast today without nausea or vomiting.  Yesterday he got slight shortness of breath therefore IV fluids were discontinued. ?Urine output for last 24 hours recorded at 2325 cc ?Serum creatinine and BUN unfortunately are still higher. ?Hemoglobin has dropped down to 6.6 and patient is getting blood transfusion. ? ?Objective:  ?Vital signs in last 24 hours:  ?Temp:  [97.7 ?F (36.5 ?C)-98.7 ?F (37.1 ?C)] 98.5 ?F (36.9 ?C) (03/26 0932) ?Pulse Rate:  [74-87] 74 (03/26 0932) ?Resp:  [18-21] 20 (03/26 0932) ?BP: (143-176)/(76-86) 150/79 (03/26 0932) ?SpO2:  [93 %-98 %] 93 % (03/26 0932) ? ?Weight change:  ?Filed Weights  ? 03/21/21 0616  ?Weight: 89.1 kg  ? ? ?Intake/Output: ?I/O last 3 completed shifts: ?In: 56 [P.O.:490] ?Out: 2925 [Urine:2925] ?  ?Intake/Output this shift: ? Total I/O ?In: 240 [P.O.:240] ?Out: -  ? ?Physical Exam: ?General: NAD, laying in the bed  ?Head: Normocephalic, atraumatic. Moist oral mucosal membranes  ?Eyes: Anicteric  ?Lungs:  Clear to auscultation, normal effort on room air  ?Heart: Regular rate and rhythm  ?Abdomen:  Soft, nontender  ?Extremities:  Rt knee dressing  ?Neurologic: Alert able to follow commands  ?Skin: No lesions  ? Foley catheter in place  ? ? ?Basic Metabolic Panel: ?Recent Labs  ?Lab 03/22/21 ?5093 03/23/21 ?0320 03/24/21 ?2671 03/25/21 ?2458 03/26/21 ?0998  ?NA 138 134* 134* 129* 129*  ?K 5.4* 4.8 4.4 4.7 4.7  ?CL 110 105 105 102 102  ?CO2 21* 21* 20* 16* 21*  ?GLUCOSE 135* 104* 101* 104* 90  ?BUN 65* 67* 74* 80* 89*  ?CREATININE 4.38* 4.89* 5.41* 5.67* 5.88*  ?CALCIUM 8.0* 8.0* 8.0* 8.0* 8.4*  ?PHOS  --   --   --  6.6*  --   ? ? ? ?Liver Function Tests: ?Recent Labs  ?Lab 03/25/21 ?0950  ?ALBUMIN 2.8*  ? ? ?No results for input(s): LIPASE, AMYLASE in the last 168 hours. ?No results for input(s): AMMONIA in the last 168  hours. ? ?CBC: ?Recent Labs  ?Lab 03/22/21 ?3382 03/23/21 ?0320 03/24/21 ?5053 03/25/21 ?9767 03/26/21 ?3419  ?WBC 6.1 6.5 6.6 7.1 7.2  ?HGB 8.7* 7.6* 7.2* 7.1* 6.6*  ?HCT 25.9* 22.9* 21.9* 22.1* 19.8*  ?MCV 97.0 98.7 99.5 101.8* 98.5  ?PLT 185 172 147* 166 182  ? ? ? ?Cardiac Enzymes: ?No results for input(s): CKTOTAL, CKMB, CKMBINDEX, TROPONINI in the last 168 hours. ? ?BNP: ?Invalid input(s): POCBNP ? ?CBG: ?Recent Labs  ?Lab 03/22/21 ?1536  ?GLUCAP 137*  ? ? ? ?Microbiology: ?Results for orders placed or performed during the hospital encounter of 03/09/21  ?Surgical PCR Screen     Status: None  ? Collection Time: 03/09/21 10:17 AM  ? Specimen: Nasal Mucosa; Nasal Swab  ?Result Value Ref Range Status  ? MRSA, PCR NEGATIVE NEGATIVE Final  ? Staphylococcus aureus NEGATIVE NEGATIVE Final  ?  Comment: (NOTE) ?The Xpert SA Assay (FDA approved for NASAL specimens in patients 68 ?years of age and older), is one component of a comprehensive ?surveillance program. It is not intended to diagnose infection nor to ?guide or monitor treatment. ?Performed at [facility], [place], ?[place] [address] ?  ? ? ?Coagulation Studies: ?No results for input(s): LABPROT, INR in the last 72 hours. ? ?Urinalysis: ?No results for input(s): COLORURINE, LABSPEC, [place], [place], [place], BILIRUBINUR, KETONESUR,  PROTEINUR, UROBILINOGEN, NITRITE, LEUKOCYTESUR in the last 72 hours. ? ?Invalid input(s): APPERANCEUR  ? ? ?Imaging: ?No results found. ? ? ?Medications:  ? ? methocarbamol (ROBAXIN) IV    ? ? sodium chloride   Intravenous Once  ? apixaban  2.5 mg Oral BID  ? carvedilol  3.125 mg Oral BID WC  ? Chlorhexidine Gluconate Cloth  6 each Topical Daily  ? cholecalciferol  1,000 Units Oral Daily  ? darbepoetin (ARANESP) injection - NON-DIALYSIS  100 mcg Subcutaneous Q Sat-1800  ? docusate sodium  100 mg Oral BID  ? ferrous [id number] C-folic acid  1 capsule Oral BID PC  ? multivitamin with minerals  1  tablet Oral Daily  ? pantoprazole  40 mg Oral Daily  ? rosuvastatin  10 mg Oral QHS  ? scopolamine  1 patch Transdermal Q72H  ? tamsulosin  0.4 mg Oral Daily  ? traMADol  50 mg Oral Q6H  ? ?acetaminophen, alum & mag hydroxide-simeth, bisacodyl, bisacodyl, diphenhydrAMINE, HYDROcodone-acetaminophen, HYDROcodone-acetaminophen, hydrocortisone cream, menthol-cetylpyridinium **OR** phenol, methocarbamol **OR** methocarbamol (ROBAXIN) IV, metoCLOPramide **OR** metoCLOPramide (REGLAN) injection, morphine injection, ondansetron **OR** ondansetron (ZOFRAN) IV, polyethylene glycol, zolpidem ? ?Assessment/ Plan:  ?[name]. is a 81 y.o.  male with past medical conditions including hypertension, diverticulosis, CAD, CHF, atrial fibrillation on Eliquis, and chronic kidney disease stage IV, who was admitted to [facility] on 03/21/2021 for S/P TKR (total knee replacement) using cement, right [Z96.651] ? ? ?Acute kidney injury on chronic kidney disease stage IV.  Baseline creatinine of 3.90, GFR 15 from March 09, 2021.  Patient is followed by Dr. Rockwell Germany from [facility] nephrology. ?AKI likely multifactorial status post surgery with issues of urinary retention.  Early on patient had decreased oral intake and there were concerns of dehydration.  He was given IV hydration without significant improvement in renal function.  For urinary retention, Foley catheter has been placed now. Patient has good urine output of over 2.3 L yesterday.   ? ?     Oral intake is poor and serum creatinine/BUN continue to worsen.  There may be mild uremia also present as patient does not feel good overall.   ?We will consult vascular surgery for PermCath placement tomorrow. ?N.p.o. after midnight except meds. ?If serum creatinine starts to improve and overall clinical status improves, we may end up observing but for now seems like patient will need to start dialysis for AKI. ? ?Lab Results  ?Component Value Date  ? CREATININE 5.88 (H) 03/26/2021  ?  CREATININE 5.67 (H) 03/25/2021  ? CREATININE 5.41 (H) 03/24/2021  ? ? ?Intake/Output Summary (Last 24 hours) at 03/26/2021 1128 ?Last data filed at 03/26/2021 1031 ?Gross per 24 hour  ?Intake 610 ml  ?Output 1650 ml  ?Net -1040 ml  ? ? ?2. Anemia of chronic kidney disease ?Normocytic ?Lab Results  ?Component Value Date  ? HGB 6.6 (L) 03/26/2021  ?  ?Hgb decreased.  ?Transferrin saturation 22%. ?ESA/Aranesp started - to be administered subcu Saturdays. ?Blood transfusion given 03/26/2021 ? ?3. Secondary Hyperparathyroidism: ?Lab Results  ?Component Value Date  ? CALCIUM 8.4 (L) 03/26/2021  ? CAION 1.17 03/21/2021  ? PHOS 6.6 (H) 03/25/2021  ?  ?Monitoring bone minerals during this admission.  Continue cholecalciferol.  ? ?4.  Urinary retention ?-Foley catheter has been placed ? ?5.  Hypertension ?Blood pressure in the 160s ?Regimen includes Coreg 3.125 mg twice a day ?Monitor closely for now.    ?We will consider low dose amlodipine if blood pressure  stays high. ? ? ? LOS: 3 ?Trinady Milewski ?3/26/202311:28 AM ?  ?Case discussed with patient and his wife at bedside. ?

## 2021-03-26 NOTE — Plan of Care (Signed)

## 2021-03-26 NOTE — Consult Note (Signed)
[name]  ? ?ASSESSMENT / PLAN: ?81 y.o. male with acute kidney injury on chronic kidney disease, stage IV. Patient likely to need dialysis going forward. Vascular surgery asked to place tunneled dialysis catheter tomorrow. Reviewed this with the patient and family. Keep NPO after midnight.  ? ?CHIEF COMPLAINT: worsening renal function ? ?HISTORY OF PRESENT ILLNESS: ?Lawrence R Guild Jr. is a 81 y.o. male admitted to the internal medicine service for postoperative care after a total knee replacement for osteoarthritis. The patient is chronically ill with history including CAD, AF on Eliquis, HFrEF (40%), CKD IV, HLA. The patient has had deterioration of his renal function during this admission and needs to initiate dialysis. He has never had a tunneled catheter, port, or pacemaker before.  ? ?Past Medical History:  ?Diagnosis Date  ? Aortic atherosclerosis ([facility])   ? Arthritis   ? Atrial fibrillation and flutter ([facility])   ? a.) CHA2DS2-VASc = 5 (age x 2, CHF, HTN, aortic plaque). b.) rate/rhythm maintain on oral carvedilol; chronically anticoagulated with dose reduced apixaban  ? BPH (benign prostatic hyperplasia)   ? Cardiac syncope   ? Cardiomyopathy ([facility])   ? a.) TTE 04/23/2016: EF 40%. b.) TTE 09/03/2016: EF 50%. c.) TTE 05/04/2019: EF >55%.  ? CHF (congestive heart failure) ([facility])   ? a.) TTE 04/23/2016: EF 40%; BAE; mild AR/TR, mod MR; G1DD. b.) TTE 09/03/2016: EF 50%; mild LVH; BAE; triv AR/PR, mod MR/TR. c.) TTE 05/04/2019: EF >55%; mod LVH, mild LA enlargement; mild AR/MR/TR  ? CKD (chronic kidney disease), stage IV ([facility])   ? Complication of anesthesia   ? a.) h/o intra/postoperative A.fib  ? Coronary artery disease   ? Diverticulosis   ? Gout   ? History of 2019 novel coronavirus disease (COVID-19) 11/14/2020  ? History of colon polyps   ? Hypercholesteremia   ? Hypertension   ? Long term current use of anticoagulant   ? a.) dose reduced apixaban; dose reduction d/t to age and CKD  Dx.  ? 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[facility])   ? a.) s/p resection  ? MGUS (monoclonal gammopathy of unknown significance)   ? Pleural effusion   ? Prostatism   ? Rectal bleeding   ? ? ?Past Surgical History:  ?Procedure Laterality Date  ? ANKLE SURGERY    ? following volleyball injury  ? APPENDECTOMY    ? COLONOSCOPY    ? COLONOSCOPY WITH PROPOFOL N/A 01/02/2017  ? Procedure: COLONOSCOPY WITH PROPOFOL;  Surgeon: Elliott, Robert T, MD;  Location: [facility] ENDOSCOPY;  Service: Endoscopy;  Laterality: N/A;  ? JOINT REPLACEMENT    ? RIGHT TOTAL HIP  ? TONSILLECTOMY    ? TOTAL HIP ARTHROPLASTY  09/2009  ? Right femoral neck fracture  ? TOTAL HIP ARTHROPLASTY Left 03/06/2016  ? Procedure: TOTAL HIP ARTHROPLASTY ANTERIOR APPROACH;  Surgeon: Adalbert Menz, MD;  Location: [facility] ORS;  Service: Orthopedics;  Laterality: Left;  ? TOTAL KNEE ARTHROPLASTY Right 03/21/2021  ? Procedure: TOTAL KNEE ARTHROPLASTY;  Surgeon: Menz, Kanon, MD;  Location: [facility] ORS;  Service: Orthopedics;  Laterality: Right;  ? ? ?Family History  ?Problem Relation Age of Onset  ? Cirrhosis Mother   ? Other Father   ?     Lung Fibrosis  ? Prostate cancer Neg Hx   ? Kidney cancer Neg Hx   ? Bladder Cancer Neg Hx   ? ? ?Social History  ? ?Socioeconomic History  ? Marital status: Married  ?  Spouse name: [name]  ?   Number of children: Not on file  ? Years of education: Not on file  ? Highest education level: Not on file  ?Occupational History  ? Occupation: Retired  ?Tobacco Use  ? Smoking status: Former  ?  Years: 15.00  ?  Types: Cigarettes  ?  Quit date: 01/01/1981  ?  Years since quitting: 40.2  ? Smokeless tobacco: Never  ?Vaping Use  ? Vaping Use: Never used  ?Substance and Sexual Activity  ? Alcohol use: Not Currently  ?  Alcohol/week: 2.0 standard drinks  ?  Types: 2 Cans of beer per week  ? Drug use: No  ? Sexual activity: Yes  ?  Birth control/protection: None  ?Other Topics Concern  ? Not on file  ?Social History Narrative  ? Regular exercise: Yes  ? ?Social Determinants of  Health  ? ?Financial Resource Strain: Not on file  ?Food Insecurity: Not on file  ?Transportation Needs: Not on file  ?Physical Activity: Not on file  ?Stress: Not on file  ?Social Connections: Not on file  ?Intimate Partner Violence: Not on file  ? ? ?No Known Allergies ? ?Current Facility-Administered Medications  ?Medication Dose Route Frequency Provider Last Rate Last Admin  ? 0.9 %  sodium chloride infusion (Manually program via Guardrails IV Fluids)   Intravenous Once Mundy, Todd, PA-C      ? acetaminophen (TYLENOL) tablet 325-650 mg  325-650 mg Oral Q6H PRN Menz, Tanav, MD      ? alum & mag hydroxide-simeth (MAALOX/MYLANTA) 200-200-20 MG/5ML suspension 30 mL  30 mL Oral Q4H PRN Menz, Bartolo, MD      ? apixaban (ELIQUIS) tablet 2.5 mg  2.5 mg Oral BID Menz, Joseangel, MD   2.5 mg at 03/26/21 0956  ? bisacodyl (DULCOLAX) EC tablet 5 mg  5 mg Oral Daily PRN Menz, Anfernee, MD   5 mg at 03/25/21 1103  ? bisacodyl (DULCOLAX) suppository 10 mg  10 mg Rectal Daily PRN Menz, Coby, MD   10 mg at 03/25/21 1708  ? carvedilol (COREG) tablet 3.125 mg  3.125 mg Oral BID WC Menz, Abdullah, MD   3.125 mg at 03/26/21 0839  ? Chlorhexidine Gluconate Cloth 2 % PADS 6 each  6 each Topical Daily Menz, Boy, MD      ? cholecalciferol (VITAMIN D3) tablet 1,000 Units  1,000 Units Oral Daily Menz, Saverio, MD   1,000 Units at 03/26/21 0956  ? Darbepoetin Alfa (ARANESP) injection 100 mcg  100 mcg Subcutaneous Q Sat-1800 Singh, Harmeet, MD   100 mcg at 03/25/21 1943  ? diphenhydrAMINE (BENADRYL) 12.5 MG/5ML elixir 12.5-25 mg  12.5-25 mg Oral Q4H PRN Menz, Tyri, MD      ? docusate sodium (COLACE) capsule 100 mg  100 mg Oral BID Menz, Elsie, MD   100 mg at 03/26/21 0956  ? ferrous fumarate-b12-vitamic C-folic acid (TRINSICON / FOLTRIN) capsule 1 capsule  1 capsule Oral BID PC Menz, Jamieson, MD   1 capsule at 03/26/21 0955  ? HYDROcodone-acetaminophen (NORCO) 7.5-325 MG per tablet 1-2 tablet  1-2 tablet Oral Q4H PRN Menz,  Jaidan, MD   1 tablet at 03/24/21 1041  ? HYDROcodone-acetaminophen (NORCO/VICODIN) 5-325 MG per tablet 1-2 tablet  1-2 tablet Oral Q4H PRN Menz, Emani, MD   1 tablet at 03/23/21 1603  ? hydrocortisone cream 1 % 1 application.  1 application. Topical BID PRN Menz, Tagg, MD      ? menthol-cetylpyridinium (CEPACOL) lozenge 3 mg  1 lozenge Oral PRN Menz, Jovi, MD      ?   Or  ? phenol (CHLORASEPTIC) mouth spray 1 spray  1 spray Mouth/Throat PRN Menz, Leam, MD      ? methocarbamol (ROBAXIN) tablet 500 mg  500 mg Oral Q6H PRN Menz, Phuong, MD   500 mg at 03/21/21 2116  ? Or  ? methocarbamol (ROBAXIN) 500 mg in dextrose 5 % 50 mL IVPB  500 mg Intravenous Q6H PRN Menz, Jassen, MD      ? metoCLOPramide (REGLAN) tablet 5-10 mg  5-10 mg Oral Q8H PRN Menz, Peniel, MD      ? Or  ? metoCLOPramide (REGLAN) injection 5-10 mg  5-10 mg Intravenous Q8H PRN Menz, Stormy, MD      ? morphine (PF) 2 MG/ML injection 0.5-1 mg  0.5-1 mg Intravenous Q2H PRN Menz, Nigil, MD   1 mg at 03/21/21 1241  ? multivitamin with minerals tablet 1 tablet  1 tablet Oral Daily Menz, Pelham, MD   1 tablet at 03/26/21 0955  ? ondansetron (ZOFRAN) tablet 4 mg  4 mg Oral Q6H PRN Menz, Galvin, MD   4 mg at 03/23/21 0504  ? Or  ? ondansetron (ZOFRAN) injection 4 mg  4 mg Intravenous Q6H PRN Menz, Anuar, MD   4 mg at 03/22/21 1620  ? pantoprazole (PROTONIX) EC tablet 40 mg  40 mg Oral Daily Menz, Caisen, MD   40 mg at 03/26/21 0955  ? polyethylene glycol (MIRALAX / GLYCOLAX) packet 17 g  17 g Oral Daily PRN Menz, Raydan, MD      ? rosuvastatin (CRESTOR) tablet 10 mg  10 mg Oral QHS Menz, Waldo, MD   10 mg at 03/25/21 2016  ? scopolamine (TRANSDERM-SCOP) 1 MG/3DAYS 1.5 mg  1 patch Transdermal Q72H Menz, Mynor, MD   1.5 mg at 03/24/21 1635  ? tamsulosin (FLOMAX) capsule 0.4 mg  0.4 mg Oral Daily Menz, Delshawn, MD   0.4 mg at 03/26/21 0956  ? traMADol (ULTRAM) tablet 50 mg  50 mg Oral Q6H Menz, Tank, MD   50 mg at 03/26/21 0520  ?  zolpidem (AMBIEN) tablet 5 mg  5 mg Oral QHS PRN Menz, Lyrick, MD      ? ? ?PHYSICAL EXAM ?Vitals:  ? 03/26/21 0536 03/26/21 0807 03/26/21 0921 03/26/21 0932  ?BP: (!) 176/84 (!) 165/76 (!) 143/84 (!) 150/79  ?

## 2021-03-26 NOTE — Progress Notes (Signed)
Noticed expiratory wheezing getting louder but no shortness of breath. Requested for Neb. MD Made aware. Neb done by RT. ?

## 2021-03-26 NOTE — Progress Notes (Signed)
PT Cancellation Note ? ?Patient Details ?Name: Lawrence Perkins. ?MRN: [id number] ?DOB: Nov 02, 1940 ? ? ?Cancelled Treatment:    Reason Eval/Treat Not Completed: Medical issues which prohibited therapy ?Pt with 6.6 HgB this am.  Transfusion complete with labs scheduled after lunch.  Will watch for results and continue with PT as appropriate when labs are back. ? ? ?Chesley Noon ?03/26/2021, 12:27 PM ?

## 2021-03-26 NOTE — Progress Notes (Signed)
Physical Therapy Treatment ?Patient Details ?Name: Lawrence Perkins. ?MRN: [id number] ?DOB: 20-Dec-1940 ?Today's Date: 03/26/2021 ? ? ?History of Present Illness Pt is a 81 y.o. male s/p elective R TKA. PMH includes: CAD, cardiomyopathy, CHF, atrial fibrillation, aortic atherosclerosis, cardiac syncope, CKD-IV, HTN, HLD, OA, BPH. ? ?  ?PT Comments  ? ? Patient agreeable to PT today. He is making progress with functional independence and increased activity tolerance this session. Gait training performed in hallway using rolling walker, and patient ambulated 110f with cues for safety. Pain reported at 3/10 in right knee that did not seem to worsen with activity. No dizziness or nausea reported. Sp02 94% on room air after walking with mild shortness of breath noted with activity. Recommend to continue PT to maximize independence.  ?  ?Recommendations for follow up therapy are one component of a multi-disciplinary discharge planning process, led by the attending physician.  Recommendations may be updated based on patient status, additional functional criteria and insurance authorization. ? ?Follow Up Recommendations ? Home health PT ?  ?  ?Assistance Recommended at Discharge Intermittent Supervision/Assistance  ?Patient can return home with the following A little help with walking and/or transfers;Assistance with cooking/housework;Assist for transportation;Help with stairs or ramp for entrance ?  ?Equipment Recommendations ? None recommended by PT  ?  ?Recommendations for Other Services   ? ? ?  ?Precautions / Restrictions Precautions ?Precautions: Fall;Knee ?Restrictions ?Weight Bearing Restrictions: Yes ?RLE Weight Bearing: Weight bearing as tolerated  ?  ? ?Mobility ? Bed Mobility ?Overal bed mobility: Needs Assistance ?Bed Mobility: Supine to Sit, Sit to Supine ?  ?  ?Supine to sit: Min guard ?Sit to supine: Min guard ?  ?General bed mobility comments: increased time required. verbal cues for sequencing and task  initiation ?  ? ?Transfers ?Overall transfer level: Needs assistance ?Equipment used: Rolling walker (2 wheels) ?Transfers: Sit to/from Stand ?Sit to Stand: Supervision ?  ?  ?  ?  ?  ?General transfer comment: no physical assistance required for standing. cues for hand placement for safety ?  ? ?Ambulation/Gait ?Ambulation/Gait assistance: Supervision ?Gait Distance (Feet): 120 Feet ?Assistive device: Rolling walker (2 wheels) ?Gait Pattern/deviations: Step-to pattern, Step-through pattern ?Gait velocity: decreased ?  ?  ?General Gait Details: step to initially progressing to step through. occasional cues for safety using rolling walker. mild shortness of breath noted, but Sp02 94% on room air immediately after walking ? ? ?Stairs ?  ?  ?  ?  ?  ? ? ?Wheelchair Mobility ?  ? ?Modified Rankin (Stroke Patients Only) ?  ? ? ?  ?Balance   ?  ?  ?  ?  ?  ?  ?  ?  ?  ?  ?  ?  ?  ?  ?  ?  ?  ?  ?  ? ?  ?Cognition Arousal/Alertness: Awake/alert ?[facility]: [facility]formed ?Overall Cognitive Status: Within Functional Limits for tasks assessed ?  ?  ?  ?  ?  ?  ?  ?  ?  ?  ?  ?  ?  ?  ?  ?  ?General Comments: patient able to follow commands consistently with extra time ?  ?  ? ?  ?Exercises Total Joint Exercises ?Goniometric ROM: knee flexion 89 degrees right knee ? ?  ?General Comments   ?  ?  ? ?Pertinent Vitals/Pain Pain Assessment ?Pain Assessment: 0-10 ?Pain Score: 3  ?Pain Location: R knee ?Pain Descriptors /  Indicators: Discomfort ?Pain Intervention(s): Monitored during session, Ice applied, Repositioned, Limited activity within patient's tolerance (polar care re-applied at end of session)  ? ? ?Home Living   ?  ?  ?  ?  ?  ?  ?  ?  ?  ?   ?  ?Prior Function    ?  ?  ?   ? ?PT Goals (current goals can now be found in the care plan section) Acute Rehab PT Goals ?Patient Stated Goal: to go home ?PT Goal Formulation: With patient ?Time For Goal Achievement: 04/04/21 ?Potential to Achieve  Goals: Good ?Progress towards PT goals: Progressing toward goals ? ?  ?Frequency ? ? ? BID ? ? ? ?  ?PT Plan Current plan remains appropriate  ? ? ?Co-evaluation   ?  ?  ?  ?  ? ?  ?AM-PAC PT "6 Clicks" Mobility   ?Outcome Measure ? Help needed turning from your back to your side while in a flat bed without using bedrails?: A Little ?Help needed moving from lying on your back to sitting on the side of a flat bed without using bedrails?: A Little ?Help needed moving to and from a bed to a chair (including a wheelchair)?: A Little ?Help needed standing up from a chair using your arms (e.g., wheelchair or bedside chair)?: A Little ?Help needed to walk in hospital room?: A Little ?Help needed climbing 3-5 steps with a railing? : A Little ?6 Click Score: 18 ? ?  ?End of Session Equipment Utilized During Treatment: Gait belt ?Activity Tolerance: Patient tolerated treatment well ?Patient left: in bed;with call bell/phone within reach;with bed alarm set (polar care in place) ?Nurse Communication: Mobility status ?PT Visit Diagnosis: Other abnormalities of gait and mobility (R26.89);Muscle weakness (generalized) (M62.81);Difficulty in walking, not elsewhere classified (R26.2);Pain ?Pain - Right/Left: Right ?Pain - part of body: Knee ?  ? ? ?Time: 6144-3154 ?PT Time Calculation (min) (ACUTE ONLY): 29 min ? ?Charges:  $Gait Training: 23-37 mins          ?          ? ?Minna Merritts, PT, MPT ? ? ? ?Percell Locus ?03/26/2021, 4:11 PM ? ?

## 2021-03-26 NOTE — Progress Notes (Signed)
1 unit PRBC transfusion started.  ?

## 2021-03-27 ENCOUNTER — Encounter: Payer: Self-pay | Admitting: Vascular Surgery

## 2021-03-27 ENCOUNTER — Encounter
Admission: RE | Disposition: A | Discharge status: Home Health | Payer: Self-pay | Source: Home / Self Care | Attending: Orthopedic Surgery

## 2021-03-27 ENCOUNTER — Inpatient Hospital Stay: Payer: Medicare HMO

## 2021-03-27 DIAGNOSIS — N186 End stage renal disease: Secondary | ICD-10-CM

## 2021-03-27 HISTORY — PX: DIALYSIS/PERMA CATHETER INSERTION: CATH118288

## 2021-03-27 LAB — CBC
HCT: 21.4 % — ABNORMAL LOW (ref 39.0–52.0)
Hemoglobin: 7.2 g/dL — ABNORMAL LOW (ref 13.0–17.0)
MCH: 32.4 pg (ref 26.0–34.0)
MCHC: 33.6 g/dL (ref 30.0–36.0)
MCV: 96.4 fL (ref 80.0–100.0)
Platelets: 161 10*3/uL (ref 150–400)
RBC: 2.22 MIL/uL — ABNORMAL LOW (ref 4.22–5.81)
RDW: 12.9 % (ref 11.5–15.5)
WBC: 7 10*3/uL (ref 4.0–10.5)
nRBC: 0 % (ref 0.0–0.2)

## 2021-03-27 LAB — BASIC METABOLIC PANEL
Anion gap: 12 (ref 5–15)
BUN: 94 mg/dL — ABNORMAL HIGH (ref 8–23)
CO2: 17 mmol/L — ABNORMAL LOW (ref 22–32)
Calcium: 8.3 mg/dL — ABNORMAL LOW (ref 8.9–10.3)
Chloride: 103 mmol/L (ref 98–111)
Creatinine, Ser: 6.1 mg/dL — ABNORMAL HIGH (ref 0.61–1.24)
GFR, Estimated: 9 mL/min — ABNORMAL LOW (ref >60)
Glucose, Bld: 97 mg/dL (ref 70–99)
Potassium: 4.3 mmol/L (ref 3.5–5.1)
Sodium: 132 mmol/L — ABNORMAL LOW (ref 135–145)

## 2021-03-27 LAB — HEPATITIS B SURFACE ANTIBODY,QUALITATIVE: Hep B S Ab: NONREACTIVE

## 2021-03-27 LAB — HEPATITIS B SURFACE ANTIGEN: Hepatitis B Surface Ag: NONREACTIVE

## 2021-03-27 LAB — HEPATITIS B CORE ANTIBODY, TOTAL: Hep B Core Total Ab: NONREACTIVE

## 2021-03-27 SURGERY — DIALYSIS/PERMA CATHETER INSERTION
Anesthesia: Moderate Sedation

## 2021-03-27 MED ORDER — SODIUM CHLORIDE 0.9 % IV SOLN: 100.0000 mL | INTRAVENOUS | Status: DC | PRN

## 2021-03-27 MED ORDER — CEFAZOLIN SODIUM-DEXTROSE 2-4 GM/100ML-% IV SOLN: 2.0000 g | INTRAVENOUS | Status: DC

## 2021-03-27 MED ORDER — CEFAZOLIN SODIUM-DEXTROSE 1-4 GM/50ML-% IV SOLN
INTRAVENOUS | Status: AC
  Administered 2021-03-27: 1 g via INTRAVENOUS
  Filled 2021-03-27: qty 50

## 2021-03-27 MED ORDER — MIDAZOLAM HCL 2 MG/2ML IJ SOLN
INTRAMUSCULAR | Status: DC | PRN
  Administered 2021-03-27: 1 mg via INTRAVENOUS

## 2021-03-27 MED ORDER — SODIUM CHLORIDE 0.9 % IV SOLN
INTRAVENOUS | Status: DC
  Administered 2021-03-27: 20 mL via INTRAVENOUS

## 2021-03-27 MED ORDER — FENTANYL CITRATE (PF) 100 MCG/2ML IJ SOLN
INTRAMUSCULAR | Status: DC | PRN
  Administered 2021-03-27: 25 ug via INTRAVENOUS

## 2021-03-27 MED ORDER — HEPARIN SODIUM (PORCINE) 1000 UNIT/ML DIALYSIS
1000.0000 [IU] | INTRAMUSCULAR | Status: DC | PRN
  Filled 2021-03-27 (×2): qty 1

## 2021-03-27 MED ORDER — CEFAZOLIN SODIUM-DEXTROSE 1-4 GM/50ML-% IV SOLN: 1.0000 g | INTRAVENOUS | Status: AC

## 2021-03-27 MED ORDER — ALTEPLASE 2 MG IJ SOLR
2.0000 mg | Freq: Once | INTRAMUSCULAR | Status: DC | PRN
  Filled 2021-03-27: qty 2

## 2021-03-27 MED ORDER — MIDAZOLAM HCL 5 MG/5ML IJ SOLN
INTRAMUSCULAR | Status: AC
  Filled 2021-03-27: qty 5

## 2021-03-27 MED ORDER — HEPARIN SODIUM (PORCINE) 1000 UNIT/ML IJ SOLN
INTRAMUSCULAR | Status: AC
  Administered 2021-03-27: 3300 [IU] via INTRAVENOUS_CENTRAL
  Filled 2021-03-27: qty 10

## 2021-03-27 MED ORDER — LIDOCAINE-PRILOCAINE 2.5-2.5 % EX CREA: 1.0000 "application " | TOPICAL_CREAM | CUTANEOUS | Status: DC | PRN

## 2021-03-27 MED ORDER — LIDOCAINE HCL (PF) 1 % IJ SOLN
5.0000 mL | INTRAMUSCULAR | Status: DC | PRN
  Filled 2021-03-27: qty 5

## 2021-03-27 MED ORDER — FENTANYL CITRATE PF 50 MCG/ML IJ SOSY
PREFILLED_SYRINGE | INTRAMUSCULAR | Status: AC
  Filled 2021-03-27: qty 2

## 2021-03-27 MED ORDER — PENTAFLUOROPROP-TETRAFLUOROETH EX AERO: 1.0000 "application " | INHALATION_SPRAY | CUTANEOUS | Status: DC | PRN

## 2021-03-27 SURGICAL SUPPLY — 7 items
CATH PALIN MAXID VT KIT 19CM (CATHETERS) ×1 IMPLANT
COVER PROBE U/S 5X48 (MISCELLANEOUS) ×1 IMPLANT
DERMABOND ADVANCED (GAUZE/BANDAGES/DRESSINGS) ×1
DERMABOND ADVANCED .7 DNX12 (GAUZE/BANDAGES/DRESSINGS) IMPLANT
PACK ANGIOGRAPHY (CUSTOM PROCEDURE TRAY) ×1 IMPLANT
SUT MNCRL AB 4-0 PS2 18 (SUTURE) ×1 IMPLANT
SUT PROLENE 0 CT 1 30 (SUTURE) ×1 IMPLANT

## 2021-03-27 NOTE — TOC Progression Note (Signed)
Transition of Care (TOC) - Progression Note  ? ? ?Patient Details  ?Name: Lawrence Perkins. ?MRN: [id number] ?Date of Birth: [birth year] ? ?Transition of Care (TOC) CM/SW Contact  ?Conception Oms, RN ?Phone Number: ?03/27/2021, 1:02 PM ? ?Clinical Narrative:   hemodialysis catheter placed today, Dialysis to start tomorrow ? ? ? ?Expected Discharge Plan: [place] ?Barriers to Discharge: Barriers Resolved ? ?Expected Discharge Plan and Services ?Expected Discharge Plan: [place] ?  ?Discharge Planning Services: CM Consult ?  ?Living arrangements for the past 2 months: [place] ?Expected Discharge Date: 03/26/21               ?DME Arranged: N/A ?  ?  ?  ?  ?HH Arranged: PT ?[place] Agency: [place] ?Date HH Agency Contacted: 03/22/21 ?Time [place]: [id number] ?Representative spoke with at [place]: cheryl ? ? ?Social Determinants of Health (SDOH) Interventions ?  ? ?Readmission Risk Interventions ?   ? View : No data to display.  ?  ?  ?  ? ? ?

## 2021-03-27 NOTE — Progress Notes (Signed)
Hemodialysis Post Treatment Note: ? ?Tx date: 03/27/2021 ?Tx time: 2 hours ?Access: left CVC ?UF Removed: 0.5L ? ?Note: ?First HD treatment today. ?HD completed, tolerated well. No complications. Patient asymptomatic, No complaints. ? ? ? ? ? ? ? ?  ?

## 2021-03-27 NOTE — Progress Notes (Signed)
?Lawrence Perkins  ?ROUNDING NOTE  ? ?Subjective:  ? ?Patient seen after vascular procedure ?Drowsy ?Denies pain or discomfort ?Wife and sister in law at bedside ? ?Objective:  ?Vital signs in last 24 hours:  ?Temp:  [97 ?F (36.1 ?C)-98.7 ?F (37.1 ?C)] 97.6 ?F (36.4 ?C) (03/27 1119) ?Pulse Rate:  [0-96] 63 (03/27 1119) ?Resp:  [12-20] 16 (03/27 1119) ?BP: (141-178)/(69-99) 158/92 (03/27 1119) ?SpO2:  [94 %-99 %] 99 % (03/27 1119) ? ?Weight change:  ?Filed Weights  ? 03/21/21 0616  ?Weight: 89.1 kg  ? ? ?Intake/Output: ?I/O last 3 completed shifts: ?In: 702 [P.O.:360; Blood:342] ?Out: 4400 [Urine:4400] ?  ?Intake/Output this shift: ? No intake/output data recorded. ? ?Physical Exam: ?General: NAD, laying in the bed  ?Head: Normocephalic, atraumatic. Moist oral mucosal membranes  ?Eyes: Anicteric  ?Lungs:  Clear to auscultation, normal effort on room air  ?Heart: Regular rate and rhythm  ?Abdomen:  Soft, nontender  ?Extremities:  Rt knee dressing  ?Neurologic: Alert able to follow commands  ?Skin: No lesions  ? Foley catheter in place  ?Access: Rt Permcath placed on 03/27/21 ? ?Basic Metabolic Panel: ?Recent Labs  ?Lab 03/23/21 ?0320 03/24/21 ?4098 03/25/21 ?1191 03/26/21 ?4782 03/27/21 ?0446  ?NA 134* 134* 129* 129* 132*  ?K 4.8 4.4 4.7 4.7 4.3  ?CL 105 105 102 102 103  ?CO2 21* 20* 16* 21* 17*  ?GLUCOSE 104* 101* 104* 90 97  ?BUN 67* 74* 80* 89* 94*  ?CREATININE 4.89* 5.41* 5.67* 5.88* 6.10*  ?CALCIUM 8.0* 8.0* 8.0* 8.4* 8.3*  ?PHOS  --   --  6.6*  --   --   ? ? ? ?Liver Function Tests: ?Recent Labs  ?Lab 03/25/21 ?0950  ?ALBUMIN 2.8*  ? ? ?No results for input(s): LIPASE, AMYLASE in the last 168 hours. ?No results for input(s): AMMONIA in the last 168 hours. ? ?CBC: ?Recent Labs  ?Lab 03/23/21 ?0320 03/24/21 ?9562 03/25/21 ?1308 03/26/21 ?6578 03/26/21 ?1315 03/27/21 ?0446  ?WBC 6.5 6.6 7.1 7.2  --  7.0  ?HGB 7.6* 7.2* 7.1* 6.6* 7.6* 7.2*  ?HCT 22.9* 21.9* 22.1* 19.8* 22.3* 21.4*  ?MCV 98.7 99.5 101.8* 98.5   --  96.4  ?PLT 172 147* 166 182  --  161  ? ? ? ?Cardiac Enzymes: ?No results for input(s): CKTOTAL, CKMB, CKMBINDEX, TROPONINI in the last 168 hours. ? ?BNP: ?Invalid input(s): POCBNP ? ?CBG: ?Recent Labs  ?Lab 03/22/21 ?1536  ?GLUCAP 137*  ? ? ? ?Microbiology: ?Results for orders placed or performed during the hospital encounter of 03/09/21  ?Surgical PCR Screen     Status: None  ? Collection Time: 03/09/21 10:17 AM  ? Specimen: Nasal Mucosa; Nasal Swab  ?Result Value Ref Range Status  ? MRSA, PCR NEGATIVE NEGATIVE Final  ? Staphylococcus aureus NEGATIVE NEGATIVE Final  ?  Comment: (NOTE) ?The Xpert SA Assay (FDA approved for NASAL specimens in patients 22 ?years of age and older), is one component of a comprehensive ?surveillance program. It is not intended to diagnose infection nor to ?guide or monitor treatment. ?Performed at [facility], [place], ?[place] [address] ?  ? ? ?Coagulation Studies: ?No results for input(s): LABPROT, INR in the last 72 hours. ? ?Urinalysis: ?No results for input(s): COLORURINE, LABSPEC, [place], GLUCOSEU, HGBUR, BILIRUBINUR, KETONESUR, PROTEINUR, UROBILINOGEN, NITRITE, LEUKOCYTESUR in the last 72 hours. ? ?Invalid input(s): APPERANCEUR  ? ? ?Imaging: ?PERIPHERAL VASCULAR CATHETERIZATION ? ?Result Date: 03/27/2021 ?See surgical note for result.  ? ? ?Medications:  ? ?  methocarbamol (ROBAXIN) IV    ? ? sodium chloride   Intravenous Once  ? apixaban  2.5 mg Oral BID  ? carvedilol  3.125 mg Oral BID WC  ? Chlorhexidine Gluconate Cloth  6 each Topical Daily  ? cholecalciferol  1,000 Units Oral Daily  ? darbepoetin (ARANESP) injection - NON-DIALYSIS  100 mcg Subcutaneous Q Sat-1800  ? docusate sodium  100 mg Oral BID  ? fentaNYL      ? ferrous [id number] C-folic acid  1 capsule Oral BID PC  ? midazolam      ? multivitamin with minerals  1 tablet Oral Daily  ? pantoprazole  40 mg Oral Daily  ? rosuvastatin  10 mg Oral QHS  ? scopolamine  1 patch  Transdermal Q72H  ? tamsulosin  0.4 mg Oral Daily  ? traMADol  50 mg Oral Q6H  ? ?acetaminophen, albuterol, alum & mag hydroxide-simeth, bisacodyl, bisacodyl, diphenhydrAMINE, HYDROcodone-acetaminophen, HYDROcodone-acetaminophen, hydrocortisone cream, menthol-cetylpyridinium **OR** phenol, methocarbamol **OR** methocarbamol (ROBAXIN) IV, metoCLOPramide **OR** metoCLOPramide (REGLAN) injection, morphine injection, ondansetron **OR** ondansetron (ZOFRAN) IV, polyethylene glycol, zolpidem ? ?Assessment/ Plan:  ?[name]. is a 81 y.o.  male with past medical conditions including hypertension, diverticulosis, CAD, CHF, atrial fibrillation on Eliquis, and chronic Perkins disease stage IV, who was admitted to [facility] on 03/21/2021 for S/P TKR (total knee replacement) using cement, right [Z96.651] ? ? ?Acute Perkins injury on chronic Perkins disease stage IV.  Baseline creatinine of 3.90, GFR 15 from March 09, 2021.  Patient is followed by Dr. Rockwell Germany from [facility] nephrology. ?AKI likely multifactorial status post surgery with issues of urinary retention.  Early on patient had decreased oral intake and there were concerns of dehydration.  He was given IV hydration without significant improvement in renal function.  For urinary retention, Foley catheter in place. ? ?     Renal function continues to decline. Intermittent confusion reported by family. Appreciate vascular surgery placing right permcath today. Will initiate dialysis today. Notified Dr Rockwell Germany of treatment plan per family request. Dialysis coordinator aware of patient and outpatient clinic search in progress.  ? ?Lab Results  ?Component Value Date  ? CREATININE 6.10 (H) 03/27/2021  ? CREATININE 5.88 (H) 03/26/2021  ? CREATININE 5.67 (H) 03/25/2021  ? ? ?Intake/Output Summary (Last 24 hours) at 03/27/2021 1152 ?Last data filed at 03/27/2021 5681 ?Gross per 24 hour  ?Intake 462 ml  ?Output 3200 ml  ?Net -2738 ml  ? ? ?2. Anemia of chronic Perkins  disease ?Normocytic ?Lab Results  ?Component Value Date  ? HGB 7.2 (L) 03/27/2021  ?  ?Hgb remains decreased.  ?Transferrin saturation 22%. ?ESA/Aranesp started - to be administered subcu Saturdays. ?Blood transfusion given 03/26/2021 ? ?3. Secondary Hyperparathyroidism: ?Lab Results  ?Component Value Date  ? CALCIUM 8.3 (L) 03/27/2021  ? CAION 1.17 03/21/2021  ? PHOS 6.6 (H) 03/25/2021  ?  ?Monitoring bone minerals during this admission.  Continue cholecalciferol.  ? ?4.  Urinary retention ?-Foley catheter has been placed ?- Urine output 3.2L recorded in 24 hrs ? ?5.  Hypertension ?Blood pressure stable for this patient ?Regimen includes Coreg 3.125 mg twice a day ?Monitor closely for now.    ?We will consider low dose amlodipine if blood pressure stays high. ? ? ? LOS: 4 ?[place] ?3/27/202311:52 AM ?  ?Case discussed with patient and his wife at bedside. ?

## 2021-03-27 NOTE — Progress Notes (Addendum)
? ?Subjective: ?6 Days Post-Op Procedure(s) (LRB): ?TOTAL KNEE ARTHROPLASTY (Right) ?Patient reports pain as 1/10.   ?Patient is doing well with no complaints.  ?Plan is for dialysis today. ?Denies any CP, SOB, ABD pain. ?We will continue therapy today.  ?Plan is to go Home after hospital stay. ? ?Objective: ?Vital signs in last 24 hours: ?Temp:  [97.9 ?F (36.6 ?C)-98.7 ?F (37.1 ?C)] 98 ?F (36.7 ?C) (03/27 0750) ?Pulse Rate:  [69-96] 69 (03/27 0750) ?Resp:  [16-21] 16 (03/27 0750) ?BP: (143-168)/(76-90) 164/90 (03/27 0750) ?SpO2:  [93 %-99 %] 99 % (03/27 0750) ? ?Intake/Output from previous day: ?03/26 0701 - 03/27 0700 ?In: 702 [P.O.:360; Blood:342] ?Out: 3200 [Urine:3200] ?Intake/Output this shift: ?No intake/output data recorded. ? ?Recent Labs  ?  03/25/21 ?0950 03/26/21 ?0437 03/26/21 ?1315 03/27/21 ?0446  ?HGB 7.1* 6.6* 7.6* 7.2*  ? ?Recent Labs  ?  03/26/21 ?0437 03/26/21 ?1315 03/27/21 ?0446  ?WBC 7.2  --  7.0  ?RBC 2.01*  --  2.22*  ?HCT 19.8* 22.3* 21.4*  ?PLT 182  --  161  ? ?Recent Labs  ?  03/26/21 ?0437 03/27/21 ?0446  ?NA 129* 132*  ?K 4.7 4.3  ?CL 102 103  ?CO2 21* 17*  ?BUN 89* 94*  ?CREATININE 5.88* 6.10*  ?GLUCOSE 90 97  ?CALCIUM 8.4* 8.3*  ? ?No results for input(s): LABPT, INR in the last 72 hours. ? ?EXAM ?General - Patient is Alert, Appropriate, and Oriented ?Extremity - Neurovascular intact ?Sensation intact distally ?Intact pulses distally ?Dorsiflexion/Plantar flexion intact ?No cellulitis present ?Compartment soft ?Dressing - dressing C/D/I and no drainage, provena intact with out drainage ?Motor Function - intact, moving foot and toes well on exam.  ? ?Past Medical History:  ?Diagnosis Date  ? Aortic atherosclerosis ([place])   ? Arthritis   ? Atrial fibrillation and flutter ([place])   ? a.) CHA2DS2-VASc = 5 (age x 2, CHF, HTN, aortic plaque). b.) rate/rhythm maintain on oral carvedilol; chronically anticoagulated with dose reduced apixaban  ? BPH (benign prostatic hyperplasia)   ? Cardiac  syncope   ? Cardiomyopathy ([place])   ? a.) TTE 04/23/2016: EF 40%. b.) TTE 09/03/2016: EF 50%. c.) TTE 05/04/2019: EF >55%.  ? CHF (congestive heart failure) ([place])   ? a.) TTE 04/23/2016: EF 40%; BAE; mild AR/TR, mod MR; G1DD. b.) TTE 09/03/2016: EF 50%; mild LVH; BAE; triv AR/PR, mod MR/TR. c.) TTE 05/04/2019: EF >55%; mod LVH, mild LA enlargement; mild AR/MR/TR  ? CKD (chronic kidney disease), stage IV ([place])   ? Complication of anesthesia   ? a.) h/o intra/postoperative A.fib  ? Coronary artery disease   ? Diverticulosis   ? Gout   ? History of 2019 novel coronavirus disease (COVID-19) 11/14/2020  ? History of colon polyps   ? Hypercholesteremia   ? Hypertension   ? Long term current use of anticoagulant   ? a.) dose reduced apixaban; dose reduction d/t to age and CKD Dx.  ? 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 a.) s/p resection  ? MGUS (monoclonal gammopathy of unknown significance)   ? Pleural effusion   ? Prostatism   ? Rectal bleeding   ? ? ?Assessment/Plan:   ?6 Days Post-Op Procedure(s) (LRB): ?TOTAL KNEE ARTHROPLASTY (Right) ?Principal Problem: ?  S/P TKR (total knee replacement) using cement, right ? ?Estimated body mass index is 27.4 kg/m? as calculated from the following: ?  Height as of this encounter: '5\' 11"'$  (1.803 m). ?  Weight as of this encounter: 89.1  kg. ?Advance diet ?Up with therapy ?Pain controlled ?Vital signs are stable ?Acute post op blood loss anemia - s/p 1 unit PRBC 03/26/2021. Hgb 7.0 this am ?Acute kidney injury -nephrology following.  Vascular consulted and patient scheduled to receive PermCath today.  Most likely will need dialysis ? ? ? ?DVT Prophylaxis - TED hose and SCDs Eliquis ?Weight-Bearing as tolerated to right leg ? ? ?T. Rachelle Hora, PA-C ?[place] ?03/27/2021, 8:01 AM ?  ?

## 2021-03-27 NOTE — Op Note (Signed)
OPERATIVE NOTE ? ? ? ?PRE-OPERATIVE DIAGNOSIS: 1. ESRD ? ? ?POST-OPERATIVE DIAGNOSIS: same as above ? ?PROCEDURE: ?Ultrasound guidance for vascular access to the right internal jugular vein ?Fluoroscopic guidance for placement of catheter ?Placement of a 19 cm tip to cuff tunneled hemodialysis catheter via the right internal jugular vein ? ?SURGEON: Leotis Pain, MD ? ?ANESTHESIA:  Local with Moderate conscious sedation for approximately 17 minutes using 1 mg of Versed and 25 mcg of Fentanyl ? ?ESTIMATED BLOOD LOSS: 5 cc ? ?FLUORO TIME: less than one minute ? ?CONTRAST: none ? ?FINDING(S): ?1.  Patent right internal jugular vein ? ?SPECIMEN(S):  None ? ?INDICATIONS:   ?Lawrence Perkins. is a 81 y.o.male who presents with renal failure.  The patient needs long term dialysis access for their ESRD, and a Permcath is necessary.  Risks and benefits are discussed and informed consent is obtained.   ? ?DESCRIPTION: ?After obtaining full informed written consent, the patient was brought back to the vascular suited. The patient's right neck and chest were sterilely prepped and draped in a sterile surgical field was created. Moderate conscious sedation was administered during a face to face encounter with the patient throughout the procedure with my supervision of the RN administering medicines and monitoring the patient's vital signs, pulse oximetry, telemetry and mental status throughout from the start of the procedure until the patient was taken to the recovery room.  The right internal jugular vein was visualized with ultrasound and found to be patent. It was then accessed under direct ultrasound guidance and a permanent image was recorded. A wire was placed. After skin nick and dilatation, the peel-away sheath was placed over the wire. ?I then turned my attention to an area under the clavicle. Approximately 1-2 fingerbreadths below the clavicle a small counterincision was created and tunneled from the subclavicular  incision to the access site. Using fluoroscopic guidance, a 19 centimeter tip to cuff tunneled hemodialysis catheter was selected, and tunneled from the subclavicular incision to the access site. It was then placed through the peel-away sheath and the peel-away sheath was removed. Using fluoroscopic guidance the catheter tips were parked in the right atrium. The appropriate distal connectors were placed. It withdrew blood well and flushed easily with heparinized saline and a concentrated heparin solution was then placed. It was secured to the chest wall with 2 Prolene sutures. The access incision was closed single 4-0 Monocryl. A 4-0 Monocryl pursestring suture was placed around the exit site. Sterile dressings were placed. ?The patient tolerated the procedure well and was taken to the recovery room in stable condition. ? ?COMPLICATIONS: None ? ?CONDITION: Stable ? ?Leotis Pain, MD ?03/27/2021 ?9:05 AM ? ? ?This note was created with Dragon Medical transcription system. Any errors in dictation are purely unintentional.  ?

## 2021-03-27 NOTE — Plan of Care (Signed)

## 2021-03-27 NOTE — Plan of Care (Signed)
  Problem: Health Behavior/Discharge Planning: Goal: Ability to manage health-related needs will improve Outcome: Progressing   Problem: Clinical Measurements: Goal: Ability to maintain clinical measurements within normal limits will improve Outcome: Progressing   

## 2021-03-27 NOTE — Interval H&P Note (Signed)
History and Physical Interval Note: ? ?03/27/2021 ?8:29 AM ? ?Lawrence Perkins.  has presented today for surgery, with the diagnosis of esrd.  The various methods of treatment have been discussed with the patient and family. After consideration of risks, benefits and other options for treatment, the patient has consented to  Procedure(s): ?DIALYSIS/PERMA CATHETER INSERTION (N/A) as a surgical intervention.  The patient's history has been reviewed, patient examined, no change in status, stable for surgery.  I have reviewed the patient's chart and labs.  Questions were answered to the patient's satisfaction.   ? ? ?Leotis Pain ? ? ?

## 2021-03-27 NOTE — Progress Notes (Signed)
PT Cancellation Note ? ?Patient Details ?Name: Lawrence Perkins. ?MRN: [id number] ?DOB: July 14, 1940 ? ? ?Cancelled Treatment:    Reason Eval/Treat Not Completed: Medical issues which prohibited therapy ? ?Pt off floor for dialysis cath this am then off unit for dialysis this pm.  Will resume tomorrow as appropriate. ? ? ?Chesley Noon ?03/27/2021, 2:30 PM ?

## 2021-03-28 LAB — HEPATITIS B SURFACE ANTIBODY, QUANTITATIVE: Hep B S AB Quant (Post): <3.1 m[IU]/mL — ABNORMAL LOW (ref >9.9)

## 2021-03-28 LAB — PREPARE RBC (CROSSMATCH)

## 2021-03-28 LAB — HEMOGLOBIN AND HEMATOCRIT, BLOOD
HCT: 24.7 % — ABNORMAL LOW (ref 39.0–52.0)
Hemoglobin: 8.4 g/dL — ABNORMAL LOW (ref 13.0–17.0)

## 2021-03-28 MED ORDER — SODIUM CHLORIDE 0.9% IV SOLUTION: Freq: Once | INTRAVENOUS | Status: DC

## 2021-03-28 MED ORDER — HEPARIN SODIUM (PORCINE) 1000 UNIT/ML IJ SOLN
INTRAMUSCULAR | Status: AC
  Administered 2021-03-28: 3300 [IU]
  Filled 2021-03-28: qty 10

## 2021-03-28 NOTE — Progress Notes (Signed)
?   03/27/21 1545 03/27/21 1600 03/27/21 1615  ?Vitals  ?BP (!) 142/68 (!) 156/74 (!) 145/85  ?MAP (mmHg) 91 94 101  ?Pulse Rate 75 (!) 38 (!) 58  ?ECG Heart Rate 62 63 (!) 58  ?Resp '14 15 20  '$ ?During Hemodialysis Assessment  ?Blood Flow Rate (mL/min) 200 mL/min 200 mL/min 200 mL/min  ?Arterial Pressure (mmHg) -70 mmHg -70 mmHg -70 mmHg  ?Venous Pressure (mmHg) 130 mmHg 100 mmHg 100 mmHg  ?Transmembrane Pressure (mmHg) 30 mmHg 30 mmHg 30 mmHg  ?Ultrafiltration Rate (mL/min) 500 mL/min 500 mL/min 500 mL/min  ?Dialysate Flow Rate (mL/min) 300 ml/min 300 ml/min 300 ml/min  ?Conductivity: Machine  13.9 13.9 13.9  ?HD Safety Checks Performed Yes Yes Yes  ?Intra-Hemodialysis Comments Progressing as prescribed Progressing as prescribed Progressing as prescribed  ? ? 03/27/21 1619  ?Vitals  ?BP  --   ?MAP (mmHg)  --   ?Pulse Rate  --   ?ECG Heart Rate  --   ?Resp  --   ?During Hemodialysis Assessment  ?Blood Flow Rate (mL/min)  --   ?Arterial Pressure (mmHg)  --   ?Venous Pressure (mmHg)  --   ?Transmembrane Pressure (mmHg)  --   ?Ultrafiltration Rate (mL/min)  --   ?Dialysate Flow Rate (mL/min)  --   ?Conductivity: Machine   --   ?HD Safety Checks Performed  --   ?Intra-Hemodialysis Comments Tx completed  ? ? ?

## 2021-03-28 NOTE — Progress Notes (Signed)
?   03/27/21 1416 03/27/21 1430 03/27/21 1445  ?Vitals  ?BP 130/69 132/70 127/72  ?MAP (mmHg) 87 90 87  ?Pulse Rate 66 61 68  ?Pulse Rate Source Monitor  --   --   ?ECG Heart Rate  --  (!) 58 66  ?Resp '16 14 15  '$ ?During Hemodialysis Assessment  ?Blood Flow Rate (mL/min) 200 mL/min 200 mL/min 200 mL/min  ?Arterial Pressure (mmHg) -60 mmHg -60 mmHg -60 mmHg  ?Venous Pressure (mmHg) 40 mmHg 40 mmHg 40 mmHg  ?Transmembrane Pressure (mmHg) 30 mmHg 30 mmHg 30 mmHg  ?Ultrafiltration Rate (mL/min) 500 mL/min 500 mL/min 500 mL/min  ?Dialysate Flow Rate (mL/min) 300 ml/min 300 ml/min 300 ml/min  ?Conductivity: Machine  13.9 13.9 13.9  ?HD Safety Checks Performed Yes Yes Yes  ?Dialysis Fluid Bolus Normal Saline  --   --   ?Bolus Amount (mL) 250 mL  --   --   ?Intra-Hemodialysis Comments Tx initiated;Progressing as prescribed Progressing as prescribed Progressing as prescribed  ? ? 03/27/21 1500 03/27/21 1515 03/27/21 1530  ?Vitals  ?BP (!) 148/72 128/67 (!) 145/71  ?MAP (mmHg) 93 84 93  ?Pulse Rate 61 67 60  ?Pulse Rate Source  --   --   --   ?ECG Heart Rate 63 64 60  ?Resp '12 17 14  '$ ?During Hemodialysis Assessment  ?Blood Flow Rate (mL/min) 200 mL/min 200 mL/min 200 mL/min  ?Arterial Pressure (mmHg) -70 mmHg -70 mmHg -70 mmHg  ?Venous Pressure (mmHg) 130 mmHg 130 mmHg 130 mmHg  ?Transmembrane Pressure (mmHg) 30 mmHg 30 mmHg 30 mmHg  ?Ultrafiltration Rate (mL/min) 500 mL/min 500 mL/min 500 mL/min  ?Dialysate Flow Rate (mL/min) 300 ml/min 300 ml/min 300 ml/min  ?Conductivity: Machine  13.9 13.9 13.9  ?HD Safety Checks Performed Yes Yes Yes  ?Dialysis Fluid Bolus  --   --   --   ?Bolus Amount (mL)  --   --   --   ?Intra-Hemodialysis Comments Progressing as prescribed Progressing as prescribed Progressing as prescribed  ? ?

## 2021-03-28 NOTE — Progress Notes (Signed)
? 03/28/21 0932 03/28/21 0945 03/28/21 1000  ?Vitals  ?Temp  --   --   --   ?Temp Source  --   --   --   ?BP 114/70 135/89 115/84  ?MAP (mmHg) 84 103 96  ?Pulse Rate 66 79 62  ?ECG Heart Rate 77 65 65  ?Resp '15 14 14  '$ ?During Hemodialysis Assessment  ?Blood Flow Rate (mL/min) 250 mL/min 250 mL/min 250 mL/min  ?Arterial Pressure (mmHg) -80 mmHg -80 mmHg -80 mmHg  ?Venous Pressure (mmHg) 60 mmHg 60 mmHg 60 mmHg  ?Transmembrane Pressure (mmHg) 30 mmHg 30 mmHg 30 mmHg  ?Ultrafiltration Rate (mL/min) 400 mL/min 400 mL/min 400 mL/min  ?Dialysate Flow Rate (mL/min) 300 ml/min 300 ml/min 300 ml/min  ?Conductivity: Machine  13.7 13.7 13.7  ?HD Safety Checks Performed Yes Yes Yes  ?Dialysis Fluid Bolus Normal Saline  --   --   ?Bolus Amount (mL) 250 mL  --   --   ?Intra-Hemodialysis Comments Progressing as prescribed Progressing as prescribed Progressing as prescribed  ? ? 03/28/21 1015 03/28/21 1030 03/28/21 1045  ?Vitals  ?Temp  --   --   --   ?Temp Source  --   --   --   ?BP 129/62 (!) 141/68 (!) 143/68  ?MAP (mmHg) 83 92 86  ?Pulse Rate 66 66 60  ?ECG Heart Rate 63 64 63  ?Resp 20 15 (!) 24  ?During Hemodialysis Assessment  ?Blood Flow Rate (mL/min) 250 mL/min 250 mL/min 250 mL/min  ?Arterial Pressure (mmHg) -80 mmHg -80 mmHg -80 mmHg  ?Venous Pressure (mmHg) 60 mmHg 60 mmHg 60 mmHg  ?Transmembrane Pressure (mmHg) 30 mmHg 30 mmHg 30 mmHg  ?Ultrafiltration Rate (mL/min) 400 mL/min 400 mL/min 400 mL/min  ?Dialysate Flow Rate (mL/min) 300 ml/min 300 ml/min 300 ml/min  ?Conductivity: Machine  13.7 13.7 13.7  ?HD Safety Checks Performed Yes Yes Yes  ?Dialysis Fluid Bolus  --   --   --   ?Bolus Amount (mL)  --   --   --   ?Intra-Hemodialysis Comments Progressing as prescribed Progressing as prescribed Progressing as prescribed  ? ? 03/28/21 1100 03/28/21 1115 03/28/21 1130  ?Vitals  ?Temp  --   --   --   ?Temp Source  --   --   --   ?BP 121/75 129/77 134/79  ?MAP (mmHg) 90 94 96  ?Pulse Rate 68 (!) 57 62  ?ECG Heart Rate 65 60  (!) 59  ?Resp (!) 28 (!) 22 (!) 22  ?During Hemodialysis Assessment  ?Blood Flow Rate (mL/min) 250 mL/min 250 mL/min 250 mL/min  ?Arterial Pressure (mmHg) -80 mmHg -80 mmHg -80 mmHg  ?Venous Pressure (mmHg) 60 mmHg 70 mmHg 70 mmHg  ?Transmembrane Pressure (mmHg) 30 mmHg 30 mmHg 30 mmHg  ?Ultrafiltration Rate (mL/min) 400 mL/min 400 mL/min 400 mL/min  ?Dialysate Flow Rate (mL/min) 300 ml/min 300 ml/min 300 ml/min  ?Conductivity: Machine  13.7 13.7 13.7  ?HD Safety Checks Performed Yes Yes Yes  ?Dialysis Fluid Bolus  --   --   --   ?Bolus Amount (mL)  --   --   --   ?Intra-Hemodialysis Comments Progressing as prescribed Progressing as prescribed Progressing as prescribed  ? ? 03/28/21 1145 03/28/21 1200 03/28/21 1214  ?Vitals  ?Temp  --  98.2 ?F (36.8 ?C)  --   ?Temp Source  --  Oral  --   ?BP (!) 145/80 (!) 149/73 (!) 147/89  ?MAP (mmHg) 98 94 106  ?Pulse  Rate 99 (!) 52 61  ?ECG Heart Rate 61 68 65  ?Resp (!) 24 (!) 30 16  ?During Hemodialysis Assessment  ?Blood Flow Rate (mL/min) 250 mL/min 250 mL/min  --   ?Arterial Pressure (mmHg) -80 mmHg -80 mmHg  --   ?Venous Pressure (mmHg) 70 mmHg 70 mmHg  --   ?Transmembrane Pressure (mmHg) 30 mmHg 20 mmHg  --   ?Ultrafiltration Rate (mL/min) 400 mL/min 400 mL/min  --   ?Dialysate Flow Rate (mL/min) 300 ml/min 300 ml/min  --   ?Conductivity: Machine  13.7 13.7  --   ?HD Safety Checks Performed Yes Yes  --   ?Dialysis Fluid Bolus  --   --   --   ?Bolus Amount (mL)  --   --   --   ?Intra-Hemodialysis Comments Progressing as prescribed See progress note  --   ? ? 03/28/21 1215 03/28/21 1230 03/28/21 1245  ?Vitals  ?Temp  --  98 ?F (36.7 ?C)  --   ?Temp Source  --   --   --   ?BP (!) 147/89 (!) 160/74 134/88  ?MAP (mmHg) 106 101 103  ?Pulse Rate 69 68 66  ?ECG Heart Rate 64 68 69  ?Resp '15 16 16  '$ ?During Hemodialysis Assessment  ?Blood Flow Rate (mL/min) 250 mL/min 250 mL/min 250 mL/min  ?Arterial Pressure (mmHg) -80 mmHg -80 mmHg -80 mmHg  ?Venous Pressure (mmHg) 70 mmHg 70  mmHg 70 mmHg  ?Transmembrane Pressure (mmHg) 20 mmHg 20 mmHg 20 mmHg  ?Ultrafiltration Rate (mL/min) 790 mL/min 270 mL/min 270 mL/min  ?Dialysate Flow Rate (mL/min) 300 ml/min 300 ml/min 300 ml/min  ?Conductivity: Machine  13.7 13.6 13.6  ?HD Safety Checks Performed  --   --   --   ?Dialysis Fluid Bolus  --   --   --   ?Bolus Amount (mL)  --   --   --   ?Intra-Hemodialysis Comments Progressing as prescribed Progressing as prescribed Progressing as prescribed  ? ? 03/28/21 1300 03/28/21 1315 03/28/21 1317  ?Vitals  ?Temp  --   --  98.4 ?F (36.9 ?C)  ?Temp Source  --   --  Oral  ?BP (!) 155/79 (!) 106/95  --   ?MAP (mmHg) 103 101  --   ?Pulse Rate (!) 130 63 (!) 52  ?ECG Heart Rate 64 (!) 54 (!) 42  ?Resp '19 13 15  '$ ?During Hemodialysis Assessment  ?Blood Flow Rate (mL/min) 250 mL/min 250 mL/min  --   ?Arterial Pressure (mmHg) -80 mmHg -80 mmHg  --   ?Venous Pressure (mmHg) 70 mmHg 70 mmHg  --   ?Transmembrane Pressure (mmHg) 20 mmHg 20 mmHg  --   ?Ultrafiltration Rate (mL/min) 270 mL/min 270 mL/min  --   ?Dialysate Flow Rate (mL/min) 300 ml/min 300 ml/min  --   ?Conductivity: Machine  13.6 13.6  --   ?HD Safety Checks Performed  --   --   --   ?Dialysis Fluid Bolus  --   --   --   ?Bolus Amount (mL)  --   --   --   ?Intra-Hemodialysis Comments Progressing as prescribed Progressing as prescribed Tx completed  ? ?

## 2021-03-28 NOTE — Progress Notes (Addendum)
Physical Therapy Treatment ?Patient Details ?Name: Lawrence Perkins. ?MRN: [id number] ?DOB: 1940-07-16 ?Today's Date: 03/28/2021 ? ? ?History of Present Illness Pt is a 81 y.o. male s/p elective R TKA. PMH includes: CAD, cardiomyopathy, CHF, atrial fibrillation, aortic atherosclerosis, cardiac syncope, CKD-IV, HTN, HLD, OA, BPH. ? ?  ?PT Comments  ? ? Patient agreeable to PT session prior to dialysis. Patient is moving well overall. Gait training performed in hallway with rolling walker with supervision provided for safety. Patient ambulated 164f. Right knee flexion is 86 degrees today and patient reports minimal pain. Recommend to continue PT to maximize independence and facilitate return to prior level of function. Recommend HHPT at discharge.  ?  ?Recommendations for follow up therapy are one component of a multi-disciplinary discharge planning process, led by the attending physician.  Recommendations may be updated based on patient status, additional functional criteria and insurance authorization. ? ?Follow Up Recommendations ? Home health PT ?  ?  ?Assistance Recommended at Discharge Intermittent Supervision/Assistance  ?Patient can return home with the following A little help with walking and/or transfers;Assistance with cooking/housework;Assist for transportation;Help with stairs or ramp for entrance ?  ?Equipment Recommendations ? None recommended by PT  ?  ?Recommendations for Other Services   ? ? ?  ?Precautions / Restrictions Precautions ?Precautions: Fall;Knee ?Restrictions ?Weight Bearing Restrictions: Yes ?RLE Weight Bearing: Weight bearing as tolerated  ?  ? ?Mobility ? Bed Mobility ?Overal bed mobility: Needs Assistance ?Bed Mobility: Supine to Sit, Sit to Supine ?  ?  ?Supine to sit: Min guard ?Sit to supine: Min guard ?  ?General bed mobility comments: verbal cues for technique. Min guard for safety ?  ? ?Transfers ?Overall transfer level: Needs assistance ?Equipment used: Rolling walker (2  wheels) ?Transfers: Sit to/from Stand ?Sit to Stand: Supervision ?  ?  ?  ?  ?  ?General transfer comment: verbal cues for hand placement with sitting. no physical assistance required for transfers ?  ? ?Ambulation/Gait ?Ambulation/Gait assistance: Supervision ?Gait Distance (Feet): 130 Feet ?Assistive device: Rolling walker (2 wheels) ?Gait Pattern/deviations: Step-through pattern ?Gait velocity: decreased ?  ?  ?General Gait Details: good safety awareness with ambulation. occasional cues for improved gait pattern with carry over demonstrated. Sp02 95% on room air after walking ? ? ?Stairs ?  ?  ?  ?  ?  ? ? ?Wheelchair Mobility ?  ? ?Modified Rankin (Stroke Patients Only) ?  ? ? ?  ?Balance   ?  ?  ?  ?  ?  ?  ?  ?  ?  ?  ?  ?  ?  ?  ?  ?  ?  ?  ?  ? ?  ?Cognition Arousal/Alertness: Awake/alert ?Behavior [facility]: [facility]/performed ?Overall Cognitive Status: Within Functional Limits for tasks assessed ?  ?  ?  ?  ?  ?  ?  ?  ?  ?  ?  ?  ?  ?  ?  ?  ?General Comments: patient is able to follow all commands today without difficulty ?  ?  ? ?  ?Exercises Total Joint Exercises ?Goniometric ROM: knee flexion 86 degrees right knee ? ?  ?General Comments General comments (skin integrity, edema, etc.): unable to progress with therapetuic exercises for strengthening as transporter present after walking to take patient to dialysis ?  ?  ? ?Pertinent Vitals/Pain Pain Assessment ?Pain Assessment: Faces ?Faces Pain Scale: Hurts little more ?Pain Location: R knee ?Pain Descriptors /  Indicators: Discomfort ?Pain Intervention(s): Limited activity within patient's tolerance, Repositioned  ? ? ?Home Living   ?  ?  ?  ?  ?  ?  ?  ?  ?  ?   ?  ?Prior Function    ?  ?  ?   ? ?PT Goals (current goals can now be found in the care plan section) Acute Rehab PT Goals ?Patient Stated Goal: to go home ?PT Goal Formulation: With patient ?Time For Goal Achievement: 04/04/21 ?Potential to Achieve Goals: Good ?Progress  towards PT goals: Progressing toward goals ? ?  ?Frequency ? ? ? BID ? ? ? ?  ?PT Plan Current plan remains appropriate  ? ? ?Co-evaluation   ?  ?  ?  ?  ? ?  ?AM-PAC PT "6 Clicks" Mobility   ?Outcome Measure ? Help needed turning from your back to your side while in a flat bed without using bedrails?: A Little ?Help needed moving from lying on your back to sitting on the side of a flat bed without using bedrails?: A Little ?Help needed moving to and from a bed to a chair (including a wheelchair)?: A Little ?Help needed standing up from a chair using your arms (e.g., wheelchair or bedside chair)?: A Little ?Help needed to walk in hospital room?: A Little ?Help needed climbing 3-5 steps with a railing? : A Little ?6 Click Score: 18 ? ?  ?End of Session Equipment Utilized During Treatment: Gait belt ?Activity Tolerance: Patient tolerated treatment well ?Patient left: in bed;with call bell/phone within reach (with staff member in the room) ?Nurse Communication: Mobility status ?PT Visit Diagnosis: Other abnormalities of gait and mobility (R26.89);Muscle weakness (generalized) (M62.81);Difficulty in walking, not elsewhere classified (R26.2);Pain ?Pain - Right/Left: Right ?Pain - part of body: Knee ?  ? ? ?Time: 5053-9767 ?PT Time Calculation (min) (ACUTE ONLY): 20 min ? ?Charges:  $Gait Training: 8-22 mins          ?          ? ?Minna Merritts, PT, MPT ? ? ? ?Percell Locus ?03/28/2021, 1:22 PM ? ?

## 2021-03-28 NOTE — Progress Notes (Addendum)
Hemodialysis Post Treatment Note: ? ?Tx date:03/28/2021 ?Tx time: 3 hours and 44 minutes ?Access: Left CVC ?UF Removed: 591m ? ?Note: ?09:32 HD started ?11:30 called primary nurse to get blood from the blood bank but primary nurse unable to take call. Charge nurse was called instead and I was again transferred to his primary nurse. ?11:45 primary nurse not available to get blood from the blood bank but the red box to carry blood was brought by a secretary to the HD unit. I am about to go to the blood bank to get the blood but then found out patient has no blood bracelet. Called charge nurse and she said I call blood bank. ?11:49. called blood bank and they said if he has no bracelet then they have to redo blood typing and screening. DR SCandiss Norsenotified and he said it can be done on the floor and to tell the primary nurse. ?12:14 Blood bracelet was found in his room. Blood transfusion started. treatment time extended and DR SCandiss Norsewas notified.  ?12:29 first 15 minutes done on his Blood transfusion.  ?12:30 added another 30 minutes on his treatment time as per NP Shantell to infuse the remaining blood ?13:17 Blood transfusion done and HD treatment completed. Pt is asymptomatic ? ? ? ? ? ? ? ?  ?

## 2021-03-28 NOTE — Progress Notes (Signed)
I asked  the pt if he needed pain medication so he could work with PT/OT. He said no.  ?

## 2021-03-28 NOTE — Progress Notes (Signed)
Following patient for outpatient hemodialysis placement. Met with patient today, clinic options given, patient chose to be placed at [facility].  ?

## 2021-03-28 NOTE — Progress Notes (Signed)
?[facility]  ?ROUNDING NOTE  ? ?Subjective:  ? ?Patient seen and evaluated during dialysis ?  ?HEMODIALYSIS FLOWSHEET: ? ?Blood Flow Rate (mL/min): 250 mL/min ?Arterial Pressure (mmHg): -80 mmHg ?Venous Pressure (mmHg): 70 mmHg ?Transmembrane Pressure (mmHg): 20 mmHg ?Ultrafiltration Rate (mL/min): 400 mL/min ?Dialysate Flow Rate (mL/min): 300 ml/min ?Conductivity: Machine : 13.7 ?Conductivity: Machine : 13.7 ?Dialysis Fluid Bolus: Normal Saline ?Bolus Amount (mL): 250 mL ? ?No complaints at this time ?Appetite continues to improve ?Denies pain and discomfort ? ?Objective:  ?Vital signs in last 24 hours:  ?Temp:  [97.8 ?F (36.6 ?C)-98.3 ?F (36.8 ?C)] 98.2 ?F (36.8 ?C) (03/28 1200) ?Pulse Rate:  [38-99] 52 (03/28 1200) ?Resp:  [12-30] 30 (03/28 1200) ?BP: (114-156)/(62-89) 149/73 (03/28 1200) ?SpO2:  [96 %-100 %] 96 % (03/28 1200) ?Weight:  [91.1 kg-93 kg] 93 kg (03/28 0914) ? ?Weight change:  ?Filed Weights  ? 03/27/21 1410 03/27/21 1645 03/28/21 0914  ?Weight: 92.5 kg 91.1 kg 93 kg  ? ? ?Intake/Output: ?I/O last 3 completed shifts: ?In: 200 [P.O.:200] ?Out: 3500 [Urine:3000; Other:500] ?  ?Intake/Output this shift: ? No intake/output data recorded. ? ?Physical Exam: ?General: NAD, laying in the bed  ?Head: Normocephalic, atraumatic. Moist oral mucosal membranes  ?Eyes: Anicteric  ?Lungs:  Clear to auscultation, normal effort on room air  ?Heart: Regular rate and rhythm  ?Abdomen:  Soft, nontender  ?Extremities:  Rt knee dressing, no peripheral edema  ?Neurologic: Alert able to follow commands  ?Skin: No lesions  ? Foley catheter in place  ?Access: Rt Permcath placed on 03/27/21 ? ?Basic Metabolic Panel: ?Recent Labs  ?Lab 03/23/21 ?0320 03/24/21 ?5027 03/25/21 ?7412 03/26/21 ?8786 03/27/21 ?0446  ?NA 134* 134* 129* 129* 132*  ?K 4.8 4.4 4.7 4.7 4.3  ?CL 105 105 102 102 103  ?CO2 21* 20* 16* 21* 17*  ?GLUCOSE 104* 101* 104* 90 97  ?BUN 67* 74* 80* 89* 94*  ?CREATININE 4.89* 5.41* 5.67* 5.88* 6.10*   ?CALCIUM 8.0* 8.0* 8.0* 8.4* 8.3*  ?PHOS  --   --  6.6*  --   --   ? ? ? ?Liver Function Tests: ?Recent Labs  ?Lab 03/25/21 ?0950  ?ALBUMIN 2.8*  ? ? ?No results for input(s): LIPASE, AMYLASE in the last 168 hours. ?No results for input(s): AMMONIA in the last 168 hours. ? ?CBC: ?Recent Labs  ?Lab 03/23/21 ?0320 03/24/21 ?7672 03/25/21 ?0947 03/26/21 ?0962 03/26/21 ?1315 03/27/21 ?0446  ?WBC 6.5 6.6 7.1 7.2  --  7.0  ?HGB 7.6* 7.2* 7.1* 6.6* 7.6* 7.2*  ?HCT 22.9* 21.9* 22.1* 19.8* 22.3* 21.4*  ?MCV 98.7 99.5 101.8* 98.5  --  96.4  ?PLT 172 147* 166 182  --  161  ? ? ? ?Cardiac Enzymes: ?No results for input(s): CKTOTAL, CKMB, CKMBINDEX, TROPONINI in the last 168 hours. ? ?BNP: ?Invalid input(s): POCBNP ? ?CBG: ?Recent Labs  ?Lab 03/22/21 ?1536  ?GLUCAP 137*  ? ? ? ?Microbiology: ?Results for orders placed or performed during the hospital encounter of 03/09/21  ?Surgical PCR Screen     Status: None  ? Collection Time: 03/09/21 10:17 AM  ? Specimen: Nasal Mucosa; Nasal Swab  ?Result Value Ref Range Status  ? MRSA, PCR NEGATIVE NEGATIVE Final  ? Staphylococcus aureus NEGATIVE NEGATIVE Final  ?  Comment: (NOTE) ?The Xpert SA Assay (FDA approved for NASAL specimens in patients 38 ?years of age and older), is one component of a comprehensive ?surveillance program. It is not intended to diagnose infection nor to ?guide  or monitor treatment. ?Performed at [facility], [place], ?[place] [address] ?  ? ? ?Coagulation Studies: ?No results for input(s): LABPROT, INR in the last 72 hours. ? ?Urinalysis: ?No results for input(s): COLORURINE, LABSPEC, [place], GLUCOSEU, HGBUR, BILIRUBINUR, KETONESUR, PROTEINUR, UROBILINOGEN, NITRITE, LEUKOCYTESUR in the last 72 hours. ? ?Invalid input(s): APPERANCEUR  ? ? ?Imaging: ?DG Chest 1 View ? ?Result Date: 03/27/2021 ?CLINICAL DATA:  Shortness of breath. EXAM: CHEST  1 VIEW COMPARISON:  Chest radiograph 02/16/2018 FINDINGS: A new right jugular dialysis catheter  terminates over the lower SVC. The cardiac silhouette remains borderline to mildly enlarged. Aortic atherosclerosis is noted. There is mild central pulmonary vascular congestion and mild diffuse prominence of the interstitial markings. There is a new small right pleural effusion. Chronic left mid lung densities correspond to calcified pleural plaques on CT. No pneumothorax is identified. IMPRESSION: Pulmonary vascular congestion/mild interstitial edema and small right pleural effusion. Electronically Signed   By: Logan Bores M.D.   On: 03/27/2021 14:03  ? ?PERIPHERAL VASCULAR CATHETERIZATION ? ?Result Date: 03/27/2021 ?See surgical note for result.  ? ? ?Medications:  ? ? methocarbamol (ROBAXIN) IV    ? ? sodium chloride   Intravenous Once  ? sodium chloride   Intravenous Once  ? apixaban  2.5 mg Oral BID  ? carvedilol  3.125 mg Oral BID WC  ? Chlorhexidine Gluconate Cloth  6 each Topical Daily  ? cholecalciferol  1,000 Units Oral Daily  ? darbepoetin (ARANESP) injection - NON-DIALYSIS  100 mcg Subcutaneous Q Sat-1800  ? docusate sodium  100 mg Oral BID  ? ferrous [id number] C-folic acid  1 capsule Oral BID PC  ? heparin sodium (porcine)      ? multivitamin with minerals  1 tablet Oral Daily  ? pantoprazole  40 mg Oral Daily  ? rosuvastatin  10 mg Oral QHS  ? tamsulosin  0.4 mg Oral Daily  ? traMADol  50 mg Oral Q6H  ? ?acetaminophen, albuterol, alum & mag hydroxide-simeth, bisacodyl, bisacodyl, diphenhydrAMINE, HYDROcodone-acetaminophen, HYDROcodone-acetaminophen, hydrocortisone cream, menthol-cetylpyridinium **OR** phenol, methocarbamol **OR** methocarbamol (ROBAXIN) IV, metoCLOPramide **OR** metoCLOPramide (REGLAN) injection, morphine injection, ondansetron **OR** ondansetron (ZOFRAN) IV, polyethylene glycol, zolpidem ? ?Assessment/ Plan:  ?[name]. is a 81 y.o.  male with past medical conditions including hypertension, diverticulosis, CAD, CHF, atrial fibrillation on Eliquis, and chronic  kidney disease stage IV, who was admitted to [facility] on 03/21/2021 for S/P TKR (total knee replacement) using cement, right [Z96.651] ? ? ?Acute kidney injury on chronic kidney disease stage IV.  Baseline creatinine of 3.90, GFR 15 from March 09, 2021.  Patient is followed by Dr. Rockwell Germany from [facility] nephrology. ?AKI likely multifactorial status post surgery with issues of urinary retention.  Early on patient had decreased oral intake and there were concerns of dehydration.  He was given IV hydration without significant improvement in renal function.  For urinary retention, Foley catheter in place. ? ?     Renal function continues to decline resulting in initiation of renal replacement therapy on 03/27/21. Rt Permcath placed by Vascular surgery. First dialysis treatment tolerated well, UF goal 543m achieved. Second dialysis treatment currently underway, tolerating well. Will dialyze tomorrow, seated in chair, to prepare for outpatient dialysis clinic ?Dialysis coordinator currently seeking outpatient clinic.  ? ?Lab Results  ?Component Value Date  ? CREATININE 6.10 (H) 03/27/2021  ? CREATININE 5.88 (H) 03/26/2021  ? CREATININE 5.67 (H) 03/25/2021  ? ? ?Intake/Output Summary (Last 24 hours) at  03/28/2021 1204 ?Last data filed at 03/28/2021 0549 ?Gross per 24 hour  ?Intake 200 ml  ?Output 2200 ml  ?Net -2000 ml  ? ? ?2. Anemia of chronic kidney disease ?Normocytic ?Lab Results  ?Component Value Date  ? HGB 7.2 (L) 03/27/2021  ? Blood transfusion given 03/26/2021 ?Hgb remains low at 7.2, 1 unit blood transfusion will be given during dialysis today.  ?Transferrin saturation 22%. ?ESA/Aranesp started - to be administered subcu Saturdays. ? ? ?3. Secondary Hyperparathyroidism: ?Lab Results  ?Component Value Date  ? CALCIUM 8.3 (L) 03/27/2021  ? CAION 1.17 03/21/2021  ? PHOS 6.6 (H) 03/25/2021  ?  ?Calcium slightly decreased, will correct with improved nutrition.   Continue cholecalciferol.  ? ?4.  Urinary retention ?-Foley catheter has  been placed ?- Urine output 1.7L recorded in 24 hrs ? ?5.  Hypertension ?Regimen includes Coreg 3.125 mg twice a day ?Monitor closely for now.    ?We will consider low dose amlodipine if blood pressure s

## 2021-03-28 NOTE — Progress Notes (Signed)
? ?Subjective: ?1 Day Post-Op Procedure(s) (LRB): ?DIALYSIS/PERMA CATHETER INSERTION (N/A) ?Patient reports pain as 1/10.   ?Patient is doing well with no complaints.  ?Patient received dialysis today along with 1 unit of packed red blood cells ?Denies any CP, SOB, ABD pain. ?Continuing to make progress of physical therapy ?Plan is to go Home after hospital stay. ? ?Objective: ?Vital signs in last 24 hours: ?Temp:  [97.8 ?F (36.6 ?C)-98.4 ?F (36.9 ?C)] 98.3 ?F (36.8 ?C) (03/28 1402) ?Pulse Rate:  [38-130] 67 (03/28 1402) ?Resp:  [13-30] 16 (03/28 1402) ?BP: (106-160)/(62-95) 138/75 (03/28 1402) ?SpO2:  [94 %-100 %] 100 % (03/28 1402) ?Weight:  [84.8 kg-93 kg] 84.8 kg (03/28 1330) ? ?Intake/Output from previous day: ?03/27 0701 - 03/28 0700 ?In: 200 [P.O.:200] ?Out: 2200 [Urine:1700] ?Intake/Output this shift: ?Total I/O ?In: 300 [Blood:300] ?Out: 900 [Urine:400; Other:500] ? ?Recent Labs  ?  03/26/21 ?0437 03/26/21 ?1315 03/27/21 ?0446  ?HGB 6.6* 7.6* 7.2*  ? ?Recent Labs  ?  03/26/21 ?0437 03/26/21 ?1315 03/27/21 ?0446  ?WBC 7.2  --  7.0  ?RBC 2.01*  --  2.22*  ?HCT 19.8* 22.3* 21.4*  ?PLT 182  --  161  ? ?Recent Labs  ?  03/26/21 ?0437 03/27/21 ?0446  ?NA 129* 132*  ?K 4.7 4.3  ?CL 102 103  ?CO2 21* 17*  ?BUN 89* 94*  ?CREATININE 5.88* 6.10*  ?GLUCOSE 90 97  ?CALCIUM 8.4* 8.3*  ? ?No results for input(s): LABPT, INR in the last 72 hours. ? ?EXAM ?General - Patient is Alert, Appropriate, and Oriented ?Extremity - Neurovascular intact ?Sensation intact distally ?Intact pulses distally ?Dorsiflexion/Plantar flexion intact ?No cellulitis present ?Compartment soft ?Dressing - dressing C/D/I and no drainage, provena intact with out drainage ?Motor Function - intact, moving foot and toes well on exam.  ? ?Past Medical History:  ?Diagnosis Date  ? Aortic atherosclerosis ([place])   ? Arthritis   ? Atrial fibrillation and flutter ([place])   ? a.) CHA2DS2-VASc = 5 (age x 2, CHF, HTN, aortic plaque). b.) rate/rhythm maintain on  oral carvedilol; chronically anticoagulated with dose reduced apixaban  ? BPH (benign prostatic hyperplasia)   ? Cardiac syncope   ? Cardiomyopathy ([place])   ? a.) TTE 04/23/2016: EF 40%. b.) TTE 09/03/2016: EF 50%. c.) TTE 05/04/2019: EF >55%.  ? CHF (congestive heart failure) ([place])   ? a.) TTE 04/23/2016: EF 40%; BAE; mild AR/TR, mod MR; G1DD. b.) TTE 09/03/2016: EF 50%; mild LVH; BAE; triv AR/PR, mod MR/TR. c.) TTE 05/04/2019: EF >55%; mod LVH, mild LA enlargement; mild AR/MR/TR  ? CKD (chronic kidney disease), stage IV ([place])   ? Complication of anesthesia   ? a.) h/o intra/postoperative A.fib  ? Coronary artery disease   ? Diverticulosis   ? Gout   ? History of 2019 novel coronavirus disease (COVID-19) 11/14/2020  ? History of colon polyps   ? Hypercholesteremia   ? Hypertension   ? Long term current use of anticoagulant   ? a.) dose reduced apixaban; dose reduction d/t to age and CKD Dx.  ? 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 a.) s/p resection  ? MGUS (monoclonal gammopathy of unknown significance)   ? Pleural effusion   ? Prostatism   ? Rectal bleeding   ? ? ?Assessment/Plan:   ?1 Day Post-Op Procedure(s) (LRB): ?DIALYSIS/PERMA CATHETER INSERTION (N/A) ?Principal Problem: ?  S/P TKR (total knee replacement) using cement, right ? ?Estimated body mass index is 26.07 kg/m? as calculated from the following: ?  Height as of this encounter: '5\' 11"'$  (1.803 m). ?  Weight as of this encounter: 84.8 kg. ?Advance diet ?Up with therapy ?Pain controlled ?Vital signs are stable ?Acute post op blood loss anemia - s/p 1 unit PRBC 03/26/2021. Hgb trending down, received second unit of packed red blood cells today 03/28/2021. ?Acute kidney injury -nephrology following.  Received dialysis today, scheduled for dialysis tomorrow.   ? ? ?DVT Prophylaxis - TED hose and SCDs Eliquis ?Weight-Bearing as tolerated to right leg ? ? ?T. Rachelle Hora, PA-C ?[place] ?03/28/2021, 3:17 PM ?  ?

## 2021-03-28 NOTE — Progress Notes (Signed)
Physical Therapy Treatment ?Patient Details ?Name: Lawrence Perkins. ?MRN: [id number] ?DOB: [birth year] ?Today's Date: 03/28/2021 ? ? ?History of Present Illness Pt is a 81 y.o. male s/p elective R TKA. PMH includes: CAD, cardiomyopathy, CHF, atrial fibrillation, aortic atherosclerosis, cardiac syncope, CKD-IV, HTN, HLD, OA, BPH. ? ?  ?PT Comments  ? ? Patient was seen for second PT session today following dialysis. Patient was fatigued after completion of dialysis and deferred out of bed activity at this time. Focused on therapeutic exercises for strengthening of RLE listed below. Patient without significant increased pain with activity. Spouse at the bedside and is hopeful to be discharged home soon. PT will continue to follow.  ?  ?Recommendations for follow up therapy are one component of a multi-disciplinary discharge planning process, led by the attending physician.  Recommendations may be updated based on patient status, additional functional criteria and insurance authorization. ? ?Follow Up Recommendations ? Home health PT ?  ?  ?Assistance Recommended at Discharge Intermittent Supervision/Assistance  ?Patient can return home with the following A little help with walking and/or transfers;Assistance with cooking/housework;Assist for transportation;Help with stairs or ramp for entrance ?  ?Equipment Recommendations ? None recommended by PT  ?  ?Recommendations for Other Services   ? ? ?  ?Precautions / Restrictions Precautions ?Precautions: Fall;Knee ?Restrictions ?Weight Bearing Restrictions: Yes ?RLE Weight Bearing: Weight bearing as tolerated  ?  ? ?Mobility ? Bed Mobility ?Overal bed mobility: Needs Assistance ?Bed Mobility: Supine to Sit, Sit to Supine ?  ?  ?Supine to sit: Min guard ?Sit to supine: Min guard ?  ?General bed mobility comments: patient declined mobility fue to feeling very fatigued after dialysis. he was agreeable to in bed exercises for strengthening ?  ? ?Transfers ?Overall transfer  level: Needs assistance ?Equipment used: Rolling walker (2 wheels) ?Transfers: Sit to/from Stand ?Sit to Stand: Supervision ?  ?  ?  ?  ?  ?General transfer comment: verbal cues for hand placement with sitting. no physical assistance required for transfers ?  ? ?Ambulation/Gait ?Ambulation/Gait assistance: Supervision ?Gait Distance (Feet): 130 Feet ?Assistive device: Rolling walker (2 wheels) ?Gait Pattern/deviations: Step-through pattern ?Gait velocity: decreased ?  ?  ?General Gait Details: good safety awareness with ambulation. occasional cues for improved gait pattern with carry over demonstrated. Sp02 95% on room air after walking ? ? ?Stairs ?  ?  ?  ?  ?  ? ? ?Wheelchair Mobility ?  ? ?Modified Rankin (Stroke Patients Only) ?  ? ? ?  ?Balance   ?  ?  ?  ?  ?  ?  ?  ?  ?  ?  ?  ?  ?  ?  ?  ?  ?  ?  ?  ? ?  ?Cognition Arousal/Alertness: Awake/alert ?Behavior During Therapy: [facility] for tasks assessed/performed ?Overall Cognitive Status: Within Functional Limits for tasks assessed ?  ?  ?  ?  ?  ?  ?  ?  ?  ?  ?  ?  ?  ?  ?  ?  ?General Comments: patient is able to follow all commands today without difficulty ?  ?  ? ?  ?Exercises Total Joint Exercises ?Ankle Circles/Pumps: AROM, Strengthening, Right, 10 reps, Supine ?Quad Sets: AROM, Strengthening, Right, 10 reps, Supine ?Short Arc Quad: AAROM, Strengthening, Right, 10 reps, Supine ?Heel Slides: AAROM, Strengthening, Right, 10 reps, Supine ?Hip ABduction/ADduction: AAROM, Strengthening, Right, 10 reps, Supine ?Straight Leg Raises: AAROM, Strengthening, Right, 10 reps, Supine ?  Goniometric ROM: knee flexion 86 degrees right knee ?Other Exercises ?Other Exercises: verbal and tactile cues for exercise technique for strengthening. spouse at the bedside. patient tolerated well without significant increased pain reported ? ?  ?General Comments General comments (skin integrity, edema, etc.): unable to progress with therapetuic exercises for strengthening as transporter  present after walking to take patient to dialysis ?  ?  ? ?Pertinent Vitals/Pain Pain Assessment ?Pain Assessment: 0-10 ?Pain Score: 5  ?Faces Pain Scale: Hurts little more ?Pain Location: right knee ?Pain Descriptors / Indicators: Discomfort ?Pain Intervention(s): Monitored during session, Limited activity within patient's tolerance  ? ? ?Home Living   ?  ?  ?  ?  ?  ?  ?  ?  ?  ?   ?  ?Prior Function    ?  ?  ?   ? ?PT Goals (current goals can now be found in the care plan section) Acute Rehab PT Goals ?Patient Stated Goal: to go home ?PT Goal Formulation: With patient ?Time For Goal Achievement: 04/04/21 ?Potential to Achieve Goals: Good ?Progress towards PT goals: Progressing toward goals ? ?  ?Frequency ? ? ? BID ? ? ? ?  ?PT Plan Current plan remains appropriate  ? ? ?Co-evaluation   ?  ?  ?  ?  ? ?  ?AM-PAC PT "6 Clicks" Mobility   ?Outcome Measure ? Help needed turning from your back to your side while in a flat bed without using bedrails?: A Little ?Help needed moving from lying on your back to sitting on the side of a flat bed without using bedrails?: A Little ?Help needed moving to and from a bed to a chair (including a wheelchair)?: A Little ?Help needed standing up from a chair using your arms (e.g., wheelchair or bedside chair)?: A Little ?Help needed to walk in hospital room?: A Little ?Help needed climbing 3-5 steps with a railing? : A Little ?6 Click Score: 18 ? ?  ?End of Session Equipment Utilized During Treatment: Gait belt ?Activity Tolerance: Patient tolerated treatment well ?Patient left: in bed;with call bell/phone within reach;with family/visitor present (polar care re-applied) ?Nurse Communication: Mobility status ?PT Visit Diagnosis: Other abnormalities of gait and mobility (R26.89);Muscle weakness (generalized) (M62.81);Difficulty in walking, not elsewhere classified (R26.2);Pain ?Pain - Right/Left: Right ?Pain - part of body: Knee ?  ? ? ?Time: 1442-1500 ?PT Time Calculation (min) (ACUTE  ONLY): 18 min ? ?Charges:   ?$Therapeutic Exercise: 8-22 mins          ?          ? ?Minna Merritts, PT, MPT ? ? ? ?Percell Locus ?03/28/2021, 3:55 PM ? ?

## 2021-03-29 DIAGNOSIS — N2581 Secondary hyperparathyroidism of renal origin: Secondary | ICD-10-CM | POA: Diagnosis not present

## 2021-03-29 DIAGNOSIS — D631 Anemia in chronic kidney disease: Secondary | ICD-10-CM | POA: Diagnosis not present

## 2021-03-29 DIAGNOSIS — N184 Chronic kidney disease, stage 4 (severe): Secondary | ICD-10-CM | POA: Diagnosis not present

## 2021-03-29 DIAGNOSIS — N179 Acute kidney failure, unspecified: Secondary | ICD-10-CM | POA: Diagnosis not present

## 2021-03-29 DIAGNOSIS — R339 Retention of urine, unspecified: Secondary | ICD-10-CM | POA: Diagnosis not present

## 2021-03-29 LAB — BPAM RBC
Blood Product Expiration Date: 202303302359
Blood Product Expiration Date: 202304292359
Blood Product Expiration Date: 202304292359
ISSUE DATE / TIME: 202303260904
ISSUE DATE / TIME: 202303281204
Unit Type and Rh: 5100
Unit Type and Rh: 5100
Unit Type and Rh: 9500

## 2021-03-29 LAB — TYPE AND SCREEN
ABO/RH(D): O POS
Antibody Screen: NEGATIVE
Unit division: 0
Unit division: 0
Unit division: 0

## 2021-03-29 LAB — CBC
HCT: 24.1 % — ABNORMAL LOW (ref 39.0–52.0)
Hemoglobin: 8.1 g/dL — ABNORMAL LOW (ref 13.0–17.0)
MCH: 31.8 pg (ref 26.0–34.0)
MCHC: 33.6 g/dL (ref 30.0–36.0)
MCV: 94.5 fL (ref 80.0–100.0)
Platelets: 161 10*3/uL (ref 150–400)
RBC: 2.55 MIL/uL — ABNORMAL LOW (ref 4.22–5.81)
RDW: 14.9 % (ref 11.5–15.5)
WBC: 5.6 10*3/uL (ref 4.0–10.5)
nRBC: 0 % (ref 0.0–0.2)

## 2021-03-29 LAB — BASIC METABOLIC PANEL
Anion gap: 6 (ref 5–15)
BUN: 42 mg/dL — ABNORMAL HIGH (ref 8–23)
CO2: 29 mmol/L (ref 22–32)
Calcium: 7.7 mg/dL — ABNORMAL LOW (ref 8.9–10.3)
Chloride: 98 mmol/L (ref 98–111)
Creatinine, Ser: 3.78 mg/dL — ABNORMAL HIGH (ref 0.61–1.24)
GFR, Estimated: 15 mL/min — ABNORMAL LOW (ref >60)
Glucose, Bld: 99 mg/dL (ref 70–99)
Potassium: 3.5 mmol/L (ref 3.5–5.1)
Sodium: 133 mmol/L — ABNORMAL LOW (ref 135–145)

## 2021-03-29 MED ORDER — HEPARIN SODIUM (PORCINE) 1000 UNIT/ML IJ SOLN
INTRAMUSCULAR | Status: AC
  Filled 2021-03-29: qty 10

## 2021-03-29 NOTE — Progress Notes (Signed)
? ?Subjective: ?2 Days Post-Op Procedure(s) (LRB): ?DIALYSIS/PERMA CATHETER INSERTION (N/A) ?Patient reports pain as mild.  ?Patient is doing well with no complaints.  ?Patient received dialysis yesterday along with 1 unit of packed red blood cells. ?Plan is for dialysis today. ?Denies any CP, SOB, ABD pain. ?Continuing to make progress with physical therapy ?Plan is to go Home after hospital stay. ? ?Objective: ?Vital signs in last 24 hours: ?Temp:  [97.8 ?F (36.6 ?C)-98.4 ?F (36.9 ?C)] 97.8 ?F (36.6 ?C) (03/29 0745) ?Pulse Rate:  [52-130] 64 (03/29 0745) ?Resp:  [13-30] 17 (03/29 0745) ?BP: (106-160)/(62-95) 159/93 (03/29 0745) ?SpO2:  [94 %-100 %] 99 % (03/29 0745) ?Weight:  [84.8 kg-93 kg] 84.8 kg (03/28 1330) ? ?Intake/Output from previous day: ?03/28 0701 - 03/29 0700 ?In: 300 [Blood:300] ?Out: 1450 [Urine:950] ?Intake/Output this shift: ?No intake/output data recorded. ? ?Recent Labs  ?  03/26/21 ?1315 03/27/21 ?0446 03/28/21 ?1526 03/29/21 ?0438  ?HGB 7.6* 7.2* 8.4* 8.1*  ? ?Recent Labs  ?  03/27/21 ?0446 03/28/21 ?1526 03/29/21 ?0438  ?WBC 7.0  --  5.6  ?RBC 2.22*  --  2.55*  ?HCT 21.4* 24.7* 24.1*  ?PLT 161  --  161  ? ?Recent Labs  ?  03/27/21 ?0446  ?NA 132*  ?K 4.3  ?CL 103  ?CO2 17*  ?BUN 94*  ?CREATININE 6.10*  ?GLUCOSE 97  ?CALCIUM 8.3*  ? ?No results for input(s): LABPT, INR in the last 72 hours. ? ?EXAM ?General - Patient is Alert, Appropriate, and Oriented ?Extremity - Neurovascular intact ?Sensation intact distally ?Intact pulses distally ?Dorsiflexion/Plantar flexion intact ?No cellulitis present ?Compartment soft ?Dressing - dressing C/D/I and no drainage, provena intact with out drainage ?Motor Function - intact, moving foot and toes well on exam.  ? ?Past Medical History:  ?Diagnosis Date  ? Aortic atherosclerosis ([place])   ? Arthritis   ? Atrial fibrillation and flutter ([place])   ? a.) CHA2DS2-VASc = 5 (age x 2, CHF, HTN, aortic plaque). b.) rate/rhythm maintain on oral carvedilol; chronically  anticoagulated with dose reduced apixaban  ? BPH (benign prostatic hyperplasia)   ? Cardiac syncope   ? Cardiomyopathy ([place])   ? a.) TTE 04/23/2016: EF 40%. b.) TTE 09/03/2016: EF 50%. c.) TTE 05/04/2019: EF >55%.  ? CHF (congestive heart failure) ([place])   ? a.) TTE 04/23/2016: EF 40%; BAE; mild AR/TR, mod MR; G1DD. b.) TTE 09/03/2016: EF 50%; mild LVH; BAE; triv AR/PR, mod MR/TR. c.) TTE 05/04/2019: EF >55%; mod LVH, mild LA enlargement; mild AR/MR/TR  ? CKD (chronic kidney disease), stage IV ([place])   ? Complication of anesthesia   ? a.) h/o intra/postoperative A.fib  ? Coronary artery disease   ? Diverticulosis   ? Gout   ? History of 2019 novel coronavirus disease (COVID-19) 11/14/2020  ? History of colon polyps   ? Hypercholesteremia   ? Hypertension   ? Long term current use of anticoagulant   ? a.) dose reduced apixaban; dose reduction d/t to age and CKD Dx.  ? 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 a.) s/p resection  ? MGUS (monoclonal gammopathy of unknown significance)   ? Pleural effusion   ? Prostatism   ? Rectal bleeding   ? ? ?Assessment/Plan:   ?2 Days Post-Op Procedure(s) (LRB): ?DIALYSIS/PERMA CATHETER INSERTION (N/A) ?Principal Problem: ?  S/P TKR (total knee replacement) using cement, right ? ?Estimated body mass index is 26.07 kg/m? as calculated from the following: ?  Height as of this encounter: 5'  11" (1.803 m). ?  Weight as of this encounter: 84.8 kg. ?Advance diet ?Up with therapy ?Pain controlled ?Vital signs are stable ?Acute post op blood loss anemia - s/p 1 unit PRBC 03/26/2021 and 1 unit of PRBC 03/28/2021. Hgb 8.1 ?Acute kidney injury -nephrology following.  Received dialysis yesterday, scheduled for dialysis again today ? ? ?DVT Prophylaxis - TED hose and SCDs Eliquis ?Weight-Bearing as tolerated to right leg ? ? ?T. Rachelle Hora, PA-C ?[place] ?03/29/2021, 8:09 AM ?  ?

## 2021-03-29 NOTE — Progress Notes (Signed)
?[facility]  ?ROUNDING NOTE  ? ?Subjective:  ? ?Patient seen and evaluated during dialysis ?  ?HEMODIALYSIS FLOWSHEET: ? ?Blood Flow Rate (mL/min): 300 mL/min ?Arterial Pressure (mmHg): -120 mmHg ?Venous Pressure (mmHg): 90 mmHg ?Transmembrane Pressure (mmHg): 50 mmHg ?Ultrafiltration Rate (mL/min): 500 mL/min ?Dialysate Flow Rate (mL/min): 500 ml/min ?Conductivity: Machine : 13.8 ?Conductivity: Machine : 13.8 ?Dialysis Fluid Bolus: Normal Saline ?Bolus Amount (mL): 250 mL ? ?Tolerated well, seated in chair ?Alert and oriented ?Improved appetite ? ?Objective:  ?Vital signs in last 24 hours:  ?Temp:  [97.8 ?F (36.6 ?C)-98.4 ?F (36.9 ?C)] 97.8 ?F (36.6 ?C) (03/29 0900) ?Pulse Rate:  [52-130] 61 (03/29 1145) ?Resp:  [13-25] 18 (03/29 1145) ?BP: (106-160)/(64-95) 149/73 (03/29 1145) ?SpO2:  [93 %-100 %] 99 % (03/29 1145) ?Weight:  [84.8 kg-92.4 kg] 92.4 kg (03/29 0900) ? ?Weight change: 0.5 kg ?Filed Weights  ? 03/28/21 0914 03/28/21 1330 03/29/21 0900  ?Weight: 93 kg 84.8 kg 92.4 kg  ? ? ?Intake/Output: ?I/O last 3 completed shifts: ?In: 500 [P.O.:200; Blood:300] ?Out: 2300 [Urine:1800; Other:500] ?  ?Intake/Output this shift: ? No intake/output data recorded. ? ?Physical Exam: ?General: NAD, seated in chair  ?Head: Normocephalic, atraumatic. Moist oral mucosal membranes  ?Eyes: Anicteric  ?Lungs:  Clear to auscultation, normal effort on room air  ?Heart: Regular rate and rhythm  ?Abdomen:  Soft, nontender  ?Extremities:  Rt knee dressing, no peripheral edema  ?Neurologic: Alert able to follow commands  ?Skin: No lesions  ? Foley catheter in place  ?Access: Rt Permcath placed on 03/27/21 ? ?Basic Metabolic Panel: ?Recent Labs  ?Lab 03/24/21 ?8657 03/25/21 ?8469 03/26/21 ?6295 03/27/21 ?2841 03/29/21 ?3244  ?NA 134* 129* 129* 132* 133*  ?K 4.4 4.7 4.7 4.3 3.5  ?CL 105 102 102 103 98  ?CO2 20* 16* 21* 17* 29  ?GLUCOSE 101* 104* 90 97 99  ?BUN 74* 80* 89* 94* 42*  ?CREATININE 5.41* 5.67* 5.88* 6.10* 3.78*   ?CALCIUM 8.0* 8.0* 8.4* 8.3* 7.7*  ?PHOS  --  6.6*  --   --   --   ? ? ? ?Liver Function Tests: ?Recent Labs  ?Lab 03/25/21 ?0950  ?ALBUMIN 2.8*  ? ? ?No results for input(s): LIPASE, AMYLASE in the last 168 hours. ?No results for input(s): AMMONIA in the last 168 hours. ? ?CBC: ?Recent Labs  ?Lab 03/24/21 ?0718 03/25/21 ?0950 03/26/21 ?0437 03/26/21 ?1315 03/27/21 ?0446 03/28/21 ?1526 03/29/21 ?0438  ?WBC 6.6 7.1 7.2  --  7.0  --  5.6  ?HGB 7.2* 7.1* 6.6* 7.6* 7.2* 8.4* 8.1*  ?HCT 21.9* 22.1* 19.8* 22.3* 21.4* 24.7* 24.1*  ?MCV 99.5 101.8* 98.5  --  96.4  --  94.5  ?PLT 147* 166 182  --  161  --  161  ? ? ? ?Cardiac Enzymes: ?No results for input(s): CKTOTAL, CKMB, CKMBINDEX, TROPONINI in the last 168 hours. ? ?BNP: ?Invalid input(s): POCBNP ? ?CBG: ?Recent Labs  ?Lab 03/22/21 ?1536  ?GLUCAP 137*  ? ? ? ?Microbiology: ?Results for orders placed or performed during the hospital encounter of 03/09/21  ?Surgical PCR Screen     Status: None  ? Collection Time: 03/09/21 10:17 AM  ? Specimen: Nasal Mucosa; Nasal Swab  ?Result Value Ref Range Status  ? MRSA, PCR NEGATIVE NEGATIVE Final  ? Staphylococcus aureus NEGATIVE NEGATIVE Final  ?  Comment: (NOTE) ?The Xpert SA Assay (FDA approved for NASAL specimens in patients 58 ?years of age and older), is one component of a comprehensive ?surveillance  program. It is not intended to diagnose infection nor to ?guide or monitor treatment. ?Performed at [facility], [place], ?[place] [address] ?  ? ? ?Coagulation Studies: ?No results for input(s): LABPROT, INR in the last 72 hours. ? ?Urinalysis: ?No results for input(s): COLORURINE, LABSPEC, [place], GLUCOSEU, HGBUR, BILIRUBINUR, KETONESUR, PROTEINUR, UROBILINOGEN, NITRITE, LEUKOCYTESUR in the last 72 hours. ? ?Invalid input(s): APPERANCEUR  ? ? ?Imaging: ?DG Chest 1 View ? ?Result Date: 03/27/2021 ?CLINICAL DATA:  Shortness of breath. EXAM: CHEST  1 VIEW COMPARISON:  Chest radiograph 02/16/2018  FINDINGS: A new right jugular dialysis catheter terminates over the lower SVC. The cardiac silhouette remains borderline to mildly enlarged. Aortic atherosclerosis is noted. There is mild central pulmonary vascular congestion and mild diffuse prominence of the interstitial markings. There is a new small right pleural effusion. Chronic left mid lung densities correspond to calcified pleural plaques on CT. No pneumothorax is identified. IMPRESSION: Pulmonary vascular congestion/mild interstitial edema and small right pleural effusion. Electronically Signed   By: Logan Bores M.D.   On: 03/27/2021 14:03   ? ? ?Medications:  ? ? methocarbamol (ROBAXIN) IV    ? ? sodium chloride   Intravenous Once  ? sodium chloride   Intravenous Once  ? apixaban  2.5 mg Oral BID  ? carvedilol  3.125 mg Oral BID WC  ? Chlorhexidine Gluconate Cloth  6 each Topical Daily  ? cholecalciferol  1,000 Units Oral Daily  ? darbepoetin (ARANESP) injection - NON-DIALYSIS  100 mcg Subcutaneous Q Sat-1800  ? docusate sodium  100 mg Oral BID  ? ferrous HENIDPOE-U23-NTIRWER C-folic acid  1 capsule Oral BID PC  ? heparin sodium (porcine)      ? multivitamin with minerals  1 tablet Oral Daily  ? pantoprazole  40 mg Oral Daily  ? rosuvastatin  10 mg Oral QHS  ? tamsulosin  0.4 mg Oral Daily  ? traMADol  50 mg Oral Q6H  ? ?acetaminophen, albuterol, alum & mag hydroxide-simeth, bisacodyl, bisacodyl, diphenhydrAMINE, HYDROcodone-acetaminophen, HYDROcodone-acetaminophen, hydrocortisone cream, menthol-cetylpyridinium **OR** phenol, methocarbamol **OR** methocarbamol (ROBAXIN) IV, metoCLOPramide **OR** metoCLOPramide (REGLAN) injection, morphine injection, ondansetron **OR** ondansetron (ZOFRAN) IV, polyethylene glycol, zolpidem/ ? ?Assessment/ Plan:  ?[name]. is a 81 y.o.  male with past medical conditions including hypertension, diverticulosis, CAD, CHF, atrial fibrillation on Eliquis, and chronic kidney disease stage IV, who was admitted to  [facility] on 03/21/2021 for S/P TKR (total knee replacement) using cement, right [Z96.651] ? ? ?Acute kidney injury on chronic kidney disease stage IV.  Baseline creatinine of 3.90, GFR 15 from March 09, 2021.  Patient is followed by Dr. Rockwell Germany from [facility] nephrology. ?AKI likely multifactorial status post surgery with issues of urinary retention.  Early on patient had decreased oral intake and there were concerns of dehydration.  He was given IV hydration without significant improvement in renal function.  For urinary retention, Foley catheter in place. ?Currently receiving dialysis seated in chair. Tolerating well. UF goal 1L as tolerated. Next treatment scheduled for Friday. ?Dialysis coordinator currently seeking outpatient clinic at [facility]. .  ? ?Lab Results  ?Component Value Date  ? CREATININE 3.78 (H) 03/29/2021  ? CREATININE 6.10 (H) 03/27/2021  ? CREATININE 5.88 (H) 03/26/2021  ? ? ?Intake/Output Summary (Last 24 hours) at 03/29/2021 1202 ?Last data filed at 03/29/2021 0830 ?Gross per 24 hour  ?Intake 300 ml  ?Output 1450 ml  ?Net -1150 ml  ? ? ?2. Anemia of chronic kidney disease ?Normocytic ?  Lab Results  ?Component Value Date  ? HGB 8.1 (L) 03/29/2021  ? Blood transfusion given 03/26/2021 and 03/29/21 ?Transferrin saturation 22%. ?ESA/Aranesp started - to be administered subcu Saturdays. ?Hgb improved to 8.1.  ? ?3. Secondary Hyperparathyroidism: ?Lab Results  ?Component Value Date  ? CALCIUM 7.7 (L) 03/29/2021  ? CAION 1.17 03/21/2021  ? PHOS 6.6 (H) 03/25/2021  ?  ?Will continue to monitor bone minerals during this admission. Continue cholecalciferol.  ? ?4.  Urinary retention ?-Foley catheter remains ?- Urine output 97m recorded in 24 hrs ? ?5.  Hypertension ?Regimen includes Coreg 3.125 mg twice a day ?Monitor closely for now.    ?We will consider low dose amlodipine if blood pressure stays high. ? BP 149/73 during dialysis ? ? LOS: 6 ?[place]?3/29/202312:02 PM ?  ? ?

## 2021-03-29 NOTE — Care Management Important Message (Signed)
Important Message ? ?Patient Details  ?Name: Lawrence Perkins. ?MRN: [id number] ?Date of Birth: 28-Aug-1940 ? ? ?Medicare Important Message Given:  Yes ? ? ? ? ?[name] [name] ?03/29/2021, 3:32 PM ?

## 2021-03-29 NOTE — Plan of Care (Signed)

## 2021-03-29 NOTE — Progress Notes (Signed)
Hemodialysis Post Treatment Note: ? ?Tx date:03/29/2021 ?Tx time:3hours ?Access:right cvc ?UF Removed:1 liter ? ?Note: ?Tolerated treatment in dialysis chair. No adverse effects/symptoms noted. CVC limbs wrapped with gauze/taped. ? ? ? ? ? ? ? ?  ?

## 2021-03-29 NOTE — Progress Notes (Signed)
Physical Therapy Treatment ?Patient Details ?Name: Lawrence Perkins. ?MRN: [id number] ?DOB: [birth year] ?Today's Date: 03/29/2021 ? ? ?History of Present Illness Pt is a 81 y.o. male s/p elective R TKA. PMH includes: CAD, cardiomyopathy, CHF, atrial fibrillation, aortic atherosclerosis, cardiac syncope, CKD-IV, HTN, HLD, OA, BPH. ? ?  ?PT Comments  ? ? Pt was supine in bed, awaiting breakfast upon arriving. He is A and O x 4 and agreeable to session. Easily and safely able to exit bed, stand, and ambulate with very little assistance. He performed ascending/descending stairs without issue. Author recommends DC home with HHPT to continue to progress pt to PLOF. May be able to go directly to OPPT if able to have transportation. Follow MD recs on post acute PT. He was sitting in recliner with all needs in reach and RN student at bedside. Will return later this afternoon post HD to progress ROM/strength. ?  ?Recommendations for follow up therapy are one component of a multi-disciplinary discharge planning process, led by the attending physician.  Recommendations may be updated based on patient status, additional functional criteria and insurance authorization. ? ?Follow Up Recommendations ? Home health PT ?  ?  ?Assistance Recommended at Discharge Set up Supervision/Assistance  ?Patient can return home with the following A little help with walking and/or transfers;Assistance with cooking/housework;Assist for transportation;Help with stairs or ramp for entrance ?  ?Equipment Recommendations ? None recommended by PT  ?  ?   ?Precautions / Restrictions Precautions ?Precautions: Fall;Knee ?Restrictions ?Weight Bearing Restrictions: Yes ?RLE Weight Bearing: Weight bearing as tolerated  ?  ? ?Mobility ? Bed Mobility ?Overal bed mobility: Needs Assistance ?Bed Mobility: Supine to Sit ?  ?  ?Supine to sit: Supervision ?  ?  ?General bed mobility comments: no physical assistance required to exit L side of bed ?   ? ?Transfers ?Overall transfer level: Needs assistance ?Equipment used: Rolling walker (2 wheels) ?Transfers: Sit to/from Stand ?Sit to Stand: Supervision ?  ?  ?Ambulation/Gait ?Ambulation/Gait assistance: Supervision ?Gait Distance (Feet): 200 Feet ?Assistive device: Rolling walker (2 wheels) ?Gait Pattern/deviations: Step-through pattern ?Gait velocity: WNL ?  ?  ?General Gait Details: Pt was easily and safely able to ambulate 200 ft with RW without LOB. Vcs only for posture correction and staying inside RW during gait training ? ? ?Stairs ?Stairs: Yes ?Stairs assistance: Supervision ?Stair Management: One rail Right, Sideways, Step to pattern ?Number of Stairs: 4 ?General stair comments: pt demonstrated safe ability to ascend/descend FOS without physical assistance. no safety concerns with stair training ? ? ?  ?Balance Overall balance assessment: Needs assistance ?Sitting-balance support: Feet supported ?Sitting balance-Leahy Scale: Good ?  ?  ?Standing balance support: Bilateral upper extremity supported, During functional activity, Reliant on assistive device for balance ?Standing balance-Leahy Scale: Good ?Standing balance comment: reliant on RW ?  ?  ?  ?  ?Cognition Arousal/Alertness: Awake/alert ?Behavior During Therapy: [facility] for tasks assessed/performed ?Overall Cognitive Status: Within Functional Limits for tasks assessed ?  ?  ?   ?General Comments: Pt is A and O x 4. Eager to DC home once cleared medically ?  ?  ? ?  ?   ?   ? ?Pertinent Vitals/Pain Pain Assessment ?Pain Assessment: 0-10 ?Pain Score: 3  ?Pain Location: right knee ?Pain Descriptors / Indicators: Discomfort ?Pain Intervention(s): Limited activity within patient's tolerance, Monitored during session, Premedicated before session, Repositioned, Ice applied  ? ? ? ?PT Goals (current goals can now be found in the care plan section)  Acute Rehab PT Goals ?Patient Stated Goal: to go home ?Progress towards PT goals: Progressing toward goals ? ?   ?Frequency ? ? ? BID ? ? ? ?  ?PT Plan Current plan remains appropriate  ? ? ?   ?AM-PAC PT "6 Clicks" Mobility   ?Outcome Measure ? Help needed turning from your back to your side while in a flat bed without using bedrails?: A Little ?Help needed moving from lying on your back to sitting on the side of a flat bed without using bedrails?: A Little ?Help needed moving to and from a bed to a chair (including a wheelchair)?: A Little ?Help needed standing up from a chair using your arms (e.g., wheelchair or bedside chair)?: A Little ?Help needed to walk in hospital room?: A Little ?Help needed climbing 3-5 steps with a railing? : A Little ?6 Click Score: 18 ? ?  ?End of Session Equipment Utilized During Treatment: Gait belt ?Activity Tolerance: Patient tolerated treatment well ?Patient left: in chair;with call bell/phone within reach;with chair alarm set ?Nurse Communication: Mobility status ?PT Visit Diagnosis: Other abnormalities of gait and mobility (R26.89);Muscle weakness (generalized) (M62.81);Difficulty in walking, not elsewhere classified (R26.2);Pain ?Pain - Right/Left: Right ?Pain - part of body: Knee ?  ? ? ?Time: 1224-8250 ?PT Time Calculation (min) (ACUTE ONLY): 26 min ? ?Charges:  $Gait Training: 23-37 mins          ?          ?Julaine Fusi PTA ?03/29/21, 8:20 AM  ? ?

## 2021-03-29 NOTE — Progress Notes (Signed)
PT Cancellation Note ? ?Patient Details ?Name: Lawrence Perkins. ?MRN: [id number] ?DOB: March 02, 1940 ? ? ?Cancelled Treatment:     PT attempt. Pt just finishing lunch after having HD this AM. " I'm leaving this afternoon, Can we just skip this session?" Pt has passed all acute PT goals and is cleared from our standpoint for safe DC home with HHPT to follow. ? ? ? ?Willette Pa ?03/29/2021, 2:22 PM ?

## 2021-03-29 NOTE — Progress Notes (Signed)
Patient discharge instructions gone over with patient and his wife. Both verbally expressed understanding. IV was taken out and all belongings were sent with patient. Patient wheeled to medical mall by staff. ?

## 2021-03-29 NOTE — Plan of Care (Signed)

## 2021-03-30 ENCOUNTER — Other Ambulatory Visit: Payer: Self-pay | Admitting: Nephrology

## 2021-03-30 DIAGNOSIS — N184 Chronic kidney disease, stage 4 (severe): Secondary | ICD-10-CM | POA: Diagnosis not present

## 2021-03-30 DIAGNOSIS — Z1159 Encounter for screening for other viral diseases: Secondary | ICD-10-CM | POA: Diagnosis not present

## 2021-03-30 DIAGNOSIS — N179 Acute kidney failure, unspecified: Secondary | ICD-10-CM | POA: Diagnosis not present

## 2021-04-01 DIAGNOSIS — N184 Chronic kidney disease, stage 4 (severe): Secondary | ICD-10-CM | POA: Diagnosis not present

## 2021-04-01 DIAGNOSIS — N179 Acute kidney failure, unspecified: Secondary | ICD-10-CM | POA: Diagnosis not present

## 2021-04-01 DIAGNOSIS — N189 Chronic kidney disease, unspecified: Secondary | ICD-10-CM | POA: Diagnosis not present

## 2021-04-04 DIAGNOSIS — N189 Chronic kidney disease, unspecified: Secondary | ICD-10-CM | POA: Diagnosis not present

## 2021-04-04 DIAGNOSIS — N184 Chronic kidney disease, stage 4 (severe): Secondary | ICD-10-CM | POA: Diagnosis not present

## 2021-04-04 DIAGNOSIS — N179 Acute kidney failure, unspecified: Secondary | ICD-10-CM | POA: Diagnosis not present

## 2021-04-05 DIAGNOSIS — Z96651 Presence of right artificial knee joint: Secondary | ICD-10-CM | POA: Diagnosis not present

## 2021-04-06 DIAGNOSIS — N179 Acute kidney failure, unspecified: Secondary | ICD-10-CM | POA: Diagnosis not present

## 2021-04-06 DIAGNOSIS — N189 Chronic kidney disease, unspecified: Secondary | ICD-10-CM | POA: Diagnosis not present

## 2021-04-06 DIAGNOSIS — N184 Chronic kidney disease, stage 4 (severe): Secondary | ICD-10-CM | POA: Diagnosis not present

## 2021-04-08 DIAGNOSIS — N179 Acute kidney failure, unspecified: Secondary | ICD-10-CM | POA: Diagnosis not present

## 2021-04-08 DIAGNOSIS — N189 Chronic kidney disease, unspecified: Secondary | ICD-10-CM | POA: Diagnosis not present

## 2021-04-08 DIAGNOSIS — N184 Chronic kidney disease, stage 4 (severe): Secondary | ICD-10-CM | POA: Diagnosis not present

## 2021-04-10 DIAGNOSIS — Z96651 Presence of right artificial knee joint: Secondary | ICD-10-CM | POA: Diagnosis not present

## 2021-04-11 DIAGNOSIS — N185 Chronic kidney disease, stage 5: Secondary | ICD-10-CM | POA: Diagnosis not present

## 2021-04-11 DIAGNOSIS — N184 Chronic kidney disease, stage 4 (severe): Secondary | ICD-10-CM | POA: Diagnosis not present

## 2021-04-11 DIAGNOSIS — N250 Renal osteodystrophy: Secondary | ICD-10-CM | POA: Diagnosis not present

## 2021-04-11 DIAGNOSIS — D509 Iron deficiency anemia, unspecified: Secondary | ICD-10-CM | POA: Diagnosis not present

## 2021-04-11 DIAGNOSIS — N189 Chronic kidney disease, unspecified: Secondary | ICD-10-CM | POA: Diagnosis not present

## 2021-04-11 DIAGNOSIS — N179 Acute kidney failure, unspecified: Secondary | ICD-10-CM | POA: Diagnosis not present

## 2021-04-11 DIAGNOSIS — E46 Unspecified protein-calorie malnutrition: Secondary | ICD-10-CM | POA: Diagnosis not present

## 2021-04-13 DIAGNOSIS — N179 Acute kidney failure, unspecified: Secondary | ICD-10-CM | POA: Diagnosis not present

## 2021-04-13 DIAGNOSIS — N189 Chronic kidney disease, unspecified: Secondary | ICD-10-CM | POA: Diagnosis not present

## 2021-04-13 DIAGNOSIS — N184 Chronic kidney disease, stage 4 (severe): Secondary | ICD-10-CM | POA: Diagnosis not present

## 2021-04-14 DIAGNOSIS — Z96651 Presence of right artificial knee joint: Secondary | ICD-10-CM | POA: Diagnosis not present

## 2021-04-15 DIAGNOSIS — N179 Acute kidney failure, unspecified: Secondary | ICD-10-CM | POA: Diagnosis not present

## 2021-04-15 DIAGNOSIS — N184 Chronic kidney disease, stage 4 (severe): Secondary | ICD-10-CM | POA: Diagnosis not present

## 2021-04-15 DIAGNOSIS — N189 Chronic kidney disease, unspecified: Secondary | ICD-10-CM | POA: Diagnosis not present

## 2021-04-19 ENCOUNTER — Other Ambulatory Visit: Payer: Self-pay | Admitting: Orthopedic Surgery

## 2021-04-19 DIAGNOSIS — S83004A Unspecified dislocation of right patella, initial encounter: Secondary | ICD-10-CM | POA: Diagnosis not present

## 2021-04-19 DIAGNOSIS — N179 Acute kidney failure, unspecified: Secondary | ICD-10-CM | POA: Diagnosis not present

## 2021-04-19 DIAGNOSIS — Z96651 Presence of right artificial knee joint: Secondary | ICD-10-CM | POA: Diagnosis not present

## 2021-04-19 NOTE — Progress Notes (Signed)
Spoke with pt's wife to verify last time Eliquis was taken, she stated that " he took it this morning", (04/19/21), she was advised to tell him not to take the evening dose tonight nor tomorrow morning. Continue to monitor. ?

## 2021-04-20 ENCOUNTER — Ambulatory Visit
Admission: RE | Admit: 2021-04-20 | Discharge: 2021-04-20 | Disposition: A | Discharge status: Home / Self Care | Payer: Medicare HMO | Attending: Orthopedic Surgery | Admitting: Orthopedic Surgery

## 2021-04-20 ENCOUNTER — Ambulatory Visit: Payer: Medicare HMO

## 2021-04-20 ENCOUNTER — Ambulatory Visit: Payer: Medicare HMO | Admitting: Anesthesiology

## 2021-04-20 ENCOUNTER — Other Ambulatory Visit: Payer: Self-pay

## 2021-04-20 ENCOUNTER — Encounter
Admission: RE | Disposition: A | Discharge status: Home / Self Care | Payer: Self-pay | Source: Home / Self Care | Attending: Orthopedic Surgery

## 2021-04-20 ENCOUNTER — Encounter: Payer: Self-pay | Admitting: Orthopedic Surgery

## 2021-04-20 DIAGNOSIS — Z7901 Long term (current) use of anticoagulants: Secondary | ICD-10-CM | POA: Insufficient documentation

## 2021-04-20 DIAGNOSIS — M25261 Flail joint, right knee: Secondary | ICD-10-CM | POA: Diagnosis not present

## 2021-04-20 DIAGNOSIS — Z96651 Presence of right artificial knee joint: Secondary | ICD-10-CM | POA: Diagnosis not present

## 2021-04-20 DIAGNOSIS — Z87891 Personal history of nicotine dependence: Secondary | ICD-10-CM | POA: Diagnosis not present

## 2021-04-20 DIAGNOSIS — S83411A Sprain of medial collateral ligament of right knee, initial encounter: Secondary | ICD-10-CM | POA: Diagnosis not present

## 2021-04-20 DIAGNOSIS — T84022A Instability of internal right knee prosthesis, initial encounter: Secondary | ICD-10-CM | POA: Insufficient documentation

## 2021-04-20 DIAGNOSIS — S83004A Unspecified dislocation of right patella, initial encounter: Secondary | ICD-10-CM | POA: Insufficient documentation

## 2021-04-20 DIAGNOSIS — I2510 Atherosclerotic heart disease of native coronary artery without angina pectoris: Secondary | ICD-10-CM | POA: Diagnosis not present

## 2021-04-20 DIAGNOSIS — Z992 Dependence on renal dialysis: Secondary | ICD-10-CM | POA: Insufficient documentation

## 2021-04-20 DIAGNOSIS — N186 End stage renal disease: Secondary | ICD-10-CM | POA: Insufficient documentation

## 2021-04-20 DIAGNOSIS — I509 Heart failure, unspecified: Secondary | ICD-10-CM | POA: Diagnosis not present

## 2021-04-20 DIAGNOSIS — I132 Hypertensive heart and chronic kidney disease with heart failure and with stage 5 chronic kidney disease, or end stage renal disease: Secondary | ICD-10-CM | POA: Diagnosis not present

## 2021-04-20 DIAGNOSIS — Y838 Other surgical procedures as the cause of abnormal reaction of the patient, or of later complication, without mention of misadventure at the time of the procedure: Secondary | ICD-10-CM | POA: Diagnosis not present

## 2021-04-20 DIAGNOSIS — T84032A Mechanical loosening of internal right knee prosthetic joint, initial encounter: Secondary | ICD-10-CM | POA: Diagnosis not present

## 2021-04-20 DIAGNOSIS — I4891 Unspecified atrial fibrillation: Secondary | ICD-10-CM | POA: Diagnosis not present

## 2021-04-20 DIAGNOSIS — X58XXXA Exposure to other specified factors, initial encounter: Secondary | ICD-10-CM | POA: Diagnosis not present

## 2021-04-20 DIAGNOSIS — M2211 Recurrent subluxation of patella, right knee: Secondary | ICD-10-CM | POA: Diagnosis not present

## 2021-04-20 HISTORY — PX: KNEE ARTHROSCOPY WITH LATERAL RELEASE: SHX5649

## 2021-04-20 LAB — POCT I-STAT, CHEM 8
BUN: 35 mg/dL — ABNORMAL HIGH (ref 8–23)
Calcium, Ion: 1.17 mmol/L (ref 1.15–1.40)
Chloride: 100 mmol/L (ref 98–111)
Creatinine, Ser: 4 mg/dL — ABNORMAL HIGH (ref 0.61–1.24)
Glucose, Bld: 103 mg/dL — ABNORMAL HIGH (ref 70–99)
HCT: 26 % — ABNORMAL LOW (ref 39.0–52.0)
Hemoglobin: 8.8 g/dL — ABNORMAL LOW (ref 13.0–17.0)
Potassium: 4.1 mmol/L (ref 3.5–5.1)
Sodium: 139 mmol/L (ref 135–145)
TCO2: 30 mmol/L (ref 22–32)

## 2021-04-20 SURGERY — ARTHROSCOPY, KNEE, WITH LATERAL RETINACULUM RELEASE
Anesthesia: General | Site: Knee | Laterality: Right

## 2021-04-20 MED ORDER — CEFAZOLIN SODIUM-DEXTROSE 2-4 GM/100ML-% IV SOLN
INTRAVENOUS | Status: AC
Start: 2021-04-20 — End: 2021-04-20
  Administered 2021-04-20: 2 g via INTRAVENOUS
  Filled 2021-04-20: qty 100

## 2021-04-20 MED ORDER — OXYCODONE HCL 5 MG PO TABS
ORAL_TABLET | ORAL | Status: AC
  Administered 2021-04-20: 5 mg via ORAL
  Filled 2021-04-20: qty 1

## 2021-04-20 MED ORDER — ONDANSETRON HCL 4 MG/2ML IJ SOLN: 4.0000 mg | Freq: Four times a day (QID) | INTRAMUSCULAR | Status: DC | PRN

## 2021-04-20 MED ORDER — HYDROCODONE-ACETAMINOPHEN 5-325 MG PO TABS: 1.0000 | ORAL_TABLET | ORAL | Status: DC | PRN

## 2021-04-20 MED ORDER — FENTANYL CITRATE (PF) 100 MCG/2ML IJ SOLN
INTRAMUSCULAR | Status: AC
  Administered 2021-04-20: 50 ug via INTRAVENOUS
  Filled 2021-04-20: qty 2

## 2021-04-20 MED ORDER — MIDAZOLAM HCL 2 MG/2ML IJ SOLN
INTRAMUSCULAR | Status: AC
  Filled 2021-04-20: qty 2

## 2021-04-20 MED ORDER — METOCLOPRAMIDE HCL 10 MG PO TABS: 5.0000 mg | ORAL_TABLET | Freq: Three times a day (TID) | ORAL | Status: DC | PRN

## 2021-04-20 MED ORDER — CHLORHEXIDINE GLUCONATE 0.12 % MT SOLN
OROMUCOSAL | Status: AC
  Administered 2021-04-20: 15 mL via OROMUCOSAL
  Filled 2021-04-20: qty 15

## 2021-04-20 MED ORDER — 0.9 % SODIUM CHLORIDE (POUR BTL) OPTIME
TOPICAL | Status: DC | PRN
  Administered 2021-04-20: 500 mL

## 2021-04-20 MED ORDER — OXYCODONE HCL 5 MG PO TABS: 5.0000 mg | ORAL_TABLET | Freq: Once | ORAL | Status: AC | PRN

## 2021-04-20 MED ORDER — ONDANSETRON HCL 4 MG PO TABS: 4.0000 mg | ORAL_TABLET | Freq: Four times a day (QID) | ORAL | Status: DC | PRN

## 2021-04-20 MED ORDER — FENTANYL CITRATE (PF) 100 MCG/2ML IJ SOLN
INTRAMUSCULAR | Status: AC
  Filled 2021-04-20: qty 2

## 2021-04-20 MED ORDER — MORPHINE SULFATE (PF) 2 MG/ML IV SOLN: 0.5000 mg | INTRAVENOUS | Status: DC | PRN

## 2021-04-20 MED ORDER — CEFAZOLIN SODIUM-DEXTROSE 2-4 GM/100ML-% IV SOLN
2.0000 g | INTRAVENOUS | Status: AC
  Administered 2021-04-20: 2 g via INTRAVENOUS

## 2021-04-20 MED ORDER — CHLORHEXIDINE GLUCONATE 0.12 % MT SOLN: 15.0000 mL | Freq: Once | OROMUCOSAL | Status: AC

## 2021-04-20 MED ORDER — HYDROCODONE-ACETAMINOPHEN 7.5-325 MG PO TABS: 1.0000 | ORAL_TABLET | ORAL | 0 refills | Status: DC | PRN

## 2021-04-20 MED ORDER — LACTATED RINGERS IV SOLN: INTRAVENOUS | Status: DC

## 2021-04-20 MED ORDER — DEXAMETHASONE SODIUM PHOSPHATE 10 MG/ML IJ SOLN
INTRAMUSCULAR | Status: DC | PRN
Start: 2021-04-20 — End: 2021-04-20
  Administered 2021-04-20: 10 mg via INTRAVENOUS

## 2021-04-20 MED ORDER — METOCLOPRAMIDE HCL 5 MG/ML IJ SOLN: 5.0000 mg | Freq: Three times a day (TID) | INTRAMUSCULAR | Status: DC | PRN

## 2021-04-20 MED ORDER — HYDROCODONE-ACETAMINOPHEN 7.5-325 MG PO TABS: 1.0000 | ORAL_TABLET | ORAL | Status: DC | PRN

## 2021-04-20 MED ORDER — FENTANYL CITRATE (PF) 100 MCG/2ML IJ SOLN
INTRAMUSCULAR | Status: DC | PRN
  Administered 2021-04-20 (×4): 50 ug via INTRAVENOUS

## 2021-04-20 MED ORDER — LIDOCAINE HCL (CARDIAC) PF 100 MG/5ML IV SOSY
PREFILLED_SYRINGE | INTRAVENOUS | Status: DC | PRN
  Administered 2021-04-20: 60 mg via INTRAVENOUS

## 2021-04-20 MED ORDER — FENTANYL CITRATE (PF) 100 MCG/2ML IJ SOLN
25.0000 ug | INTRAMUSCULAR | Status: DC | PRN
  Administered 2021-04-20: 50 ug via INTRAVENOUS

## 2021-04-20 MED ORDER — CEFAZOLIN SODIUM-DEXTROSE 2-4 GM/100ML-% IV SOLN
INTRAVENOUS | Status: AC
  Filled 2021-04-20: qty 100

## 2021-04-20 MED ORDER — OXYCODONE HCL 5 MG/5ML PO SOLN: 5.0000 mg | Freq: Once | ORAL | Status: AC | PRN

## 2021-04-20 MED ORDER — ACETAMINOPHEN 325 MG PO TABS: 325.0000 mg | ORAL_TABLET | Freq: Four times a day (QID) | ORAL | Status: DC | PRN

## 2021-04-20 MED ORDER — ORAL CARE MOUTH RINSE: 15.0000 mL | Freq: Once | OROMUCOSAL | Status: AC

## 2021-04-20 MED ORDER — HYDRALAZINE HCL 20 MG/ML IJ SOLN
INTRAMUSCULAR | Status: AC
  Filled 2021-04-20: qty 1

## 2021-04-20 MED ORDER — CEFAZOLIN SODIUM-DEXTROSE 2-4 GM/100ML-% IV SOLN: 2.0000 g | Freq: Four times a day (QID) | INTRAVENOUS | Status: DC

## 2021-04-20 MED ORDER — PROPOFOL 10 MG/ML IV BOLUS
INTRAVENOUS | Status: DC | PRN
  Administered 2021-04-20: 100 mg via INTRAVENOUS
  Administered 2021-04-20: 50 mg via INTRAVENOUS

## 2021-04-20 MED ORDER — SODIUM CHLORIDE 0.9 % IR SOLN
Status: DC | PRN
  Administered 2021-04-20: 3000 mL

## 2021-04-20 MED ORDER — HYDRALAZINE HCL 20 MG/ML IJ SOLN
10.0000 mg | Freq: Once | INTRAMUSCULAR | Status: AC
  Administered 2021-04-20: 10 mg via INTRAVENOUS

## 2021-04-20 MED ORDER — SODIUM CHLORIDE 0.9 % IV SOLN: INTRAVENOUS | Status: DC

## 2021-04-20 MED ORDER — ONDANSETRON HCL 4 MG/2ML IJ SOLN
INTRAMUSCULAR | Status: DC | PRN
  Administered 2021-04-20: 4 mg via INTRAVENOUS

## 2021-04-20 SURGICAL SUPPLY — 81 items
APL PRP STRL LF DISP 70% ISPRP (MISCELLANEOUS) ×3
BLADE INCISOR PLUS 4.5 (BLADE) IMPLANT
BLADE SAGITTAL 25.0X1.19X90 (BLADE) ×1 IMPLANT
BLADE SAW 90X13X1.19 OSCILLAT (BLADE) ×1 IMPLANT
BNDG ELASTIC 4X5.8 VLCR STR LF (GAUZE/BANDAGES/DRESSINGS) IMPLANT
BNDG ELASTIC 6X5.8 VLCR STR LF (GAUZE/BANDAGES/DRESSINGS) ×2 IMPLANT
CHLORAPREP W/TINT 26 (MISCELLANEOUS) ×6 IMPLANT
COOLER POLAR GLACIER W/PUMP (MISCELLANEOUS) ×2 IMPLANT
CUFF TOURN SGL QUICK 24 (TOURNIQUET CUFF)
CUFF TOURN SGL QUICK 34 (TOURNIQUET CUFF)
CUFF TRNQT CYL 24X4X16.5-23 (TOURNIQUET CUFF) IMPLANT
CUFF TRNQT CYL 34X4.125X (TOURNIQUET CUFF) IMPLANT
DRAPE 3/4 80X56 (DRAPES) ×4 IMPLANT
DRAPE ARTHRO LIMB 89X125 STRL (DRAPES) ×2 IMPLANT
DRAPE C-ARMOR (DRAPES) IMPLANT
DRSG MEPILEX SACRM 8.7X9.8 (GAUZE/BANDAGES/DRESSINGS) ×2 IMPLANT
ELECT CAUTERY BLADE 6.4 (BLADE) ×2 IMPLANT
ELECT REM PT RETURN 9FT ADLT (ELECTROSURGICAL) ×2
ELECTRODE REM PT RTRN 9FT ADLT (ELECTROSURGICAL) ×1 IMPLANT
GAUZE SPONGE 4X4 12PLY STRL (GAUZE/BANDAGES/DRESSINGS) ×4 IMPLANT
GAUZE XEROFORM 1X8 LF (GAUZE/BANDAGES/DRESSINGS) ×2 IMPLANT
GLOVE SURG ORTHO 8.0 STRL STRW (GLOVE) ×2 IMPLANT
GLOVE SURG SYN 9.0  PF PI (GLOVE) ×2
GLOVE SURG SYN 9.0 PF PI (GLOVE) ×2 IMPLANT
GLOVE SURG UNDER LTX SZ8 (GLOVE) ×2 IMPLANT
GLOVE SURG UNDER POLY LF SZ9 (GLOVE) ×2 IMPLANT
GOWN SRG 2XL LVL 4 RGLN SLV (GOWNS) ×2 IMPLANT
GOWN STRL NON-REIN 2XL LVL4 (GOWNS) ×4
GOWN STRL REUS W/ TWL LRG LVL3 (GOWN DISPOSABLE) ×3 IMPLANT
GOWN STRL REUS W/ TWL XL LVL3 (GOWN DISPOSABLE) ×1 IMPLANT
GOWN STRL REUS W/TWL LRG LVL3 (GOWN DISPOSABLE) ×6
GOWN STRL REUS W/TWL XL LVL3 (GOWN DISPOSABLE) ×2
HOLDER FOLEY CATH W/STRAP (MISCELLANEOUS) ×2 IMPLANT
HOOD PEEL AWAY FLYTE STAYCOOL (MISCELLANEOUS) ×4 IMPLANT
INSERT TIBIAL FIXED SZ5 RT 13M (Insert) ×1 IMPLANT
IV LACTATED RINGER IRRG 3000ML (IV SOLUTION) ×4
IV LR IRRIG 3000ML ARTHROMATIC (IV SOLUTION) ×2 IMPLANT
IV NS IRRIG 3000ML ARTHROMATIC (IV SOLUTION) ×2 IMPLANT
KIT PREVENA INCISION MGT20CM45 (CANNISTER) ×2 IMPLANT
KIT TURNOVER KIT A (KITS) ×4 IMPLANT
MANIFOLD NEPTUNE II (INSTRUMENTS) ×6 IMPLANT
NDL SAFETY ECLIPSE 18X1.5 (NEEDLE) ×1 IMPLANT
NDL SPNL 18GX3.5 QUINCKE PK (NEEDLE) ×1 IMPLANT
NDL SPNL 20GX3.5 QUINCKE YW (NEEDLE) ×1 IMPLANT
NEEDLE HYPO 18GX1.5 SHARP (NEEDLE) ×2
NEEDLE HYPO 22GX1.5 SAFETY (NEEDLE) ×2 IMPLANT
NEEDLE SPNL 18GX3.5 QUINCKE PK (NEEDLE) ×2 IMPLANT
NEEDLE SPNL 20GX3.5 QUINCKE YW (NEEDLE) ×2 IMPLANT
NS IRRIG 1000ML POUR BTL (IV SOLUTION) ×2 IMPLANT
PACK ARTHROSCOPY KNEE (MISCELLANEOUS) ×2 IMPLANT
PACK TOTAL KNEE (MISCELLANEOUS) ×2 IMPLANT
PAD WRAPON POLAR KNEE (MISCELLANEOUS) ×1 IMPLANT
PENCIL SMOKE EVACUATOR COATED (MISCELLANEOUS) ×1 IMPLANT
PULSAVAC PLUS IRRIG FAN TIP (DISPOSABLE) ×2
SCALPEL PROTECTED #10 DISP (BLADE) ×4 IMPLANT
SCALPEL PROTECTED #11 DISP (BLADE) ×2 IMPLANT
SOLUTION IRRIG SURGIPHOR (IV SOLUTION) ×1 IMPLANT
SOLUTION PRONTOSAN WOUND 350ML (IRRIGATION / IRRIGATOR) ×1 IMPLANT
SPONGE T-LAP 18X18 ~~LOC~~+RFID (SPONGE) ×2 IMPLANT
STAPLER SKIN PROX 35W (STAPLE) ×2 IMPLANT
SUCTION FRAZIER HANDLE 10FR (MISCELLANEOUS) ×1
SUCTION TUBE FRAZIER 10FR DISP (MISCELLANEOUS) ×1 IMPLANT
SUT DVC 2 QUILL PDO  T11 36X36 (SUTURE) ×1
SUT DVC 2 QUILL PDO T11 36X36 (SUTURE) ×1 IMPLANT
SUT ETHIBOND 2 V 37 (SUTURE) ×1 IMPLANT
SUT ETHILON 4-0 (SUTURE) ×2
SUT ETHILON 4-0 FS2 18XMFL BLK (SUTURE) ×1
SUT V-LOC 90 ABS DVC 3-0 CL (SUTURE) ×1 IMPLANT
SUTURE ETHLN 4-0 FS2 18XMF BLK (SUTURE) ×1 IMPLANT
SYR 20ML LL LF (SYRINGE) ×2 IMPLANT
SYR 50ML LL SCALE MARK (SYRINGE) ×4 IMPLANT
TIP FAN IRRIG PULSAVAC PLUS (DISPOSABLE) ×1 IMPLANT
TOWEL OR 17X26 4PK STRL BLUE (TOWEL DISPOSABLE) ×2 IMPLANT
TOWER CARTRIDGE SMART MIX (DISPOSABLE) ×1 IMPLANT
TRAY FOLEY MTR SLVR 16FR STAT (SET/KITS/TRAYS/PACK) ×1 IMPLANT
TUBING INFLOW SET DBFLO PUMP (TUBING) ×1 IMPLANT
TUBING OUTFLOW SET DBLFO PUMP (TUBING) ×1 IMPLANT
WAND COBLATION FLOW 50 (SURGICAL WAND) IMPLANT
WATER STERILE IRR 1000ML POUR (IV SOLUTION) ×2 IMPLANT
WATER STERILE IRR 500ML POUR (IV SOLUTION) ×2 IMPLANT
WRAPON POLAR PAD KNEE (MISCELLANEOUS) ×2

## 2021-04-20 NOTE — Discharge Instructions (Addendum)
Keep knee straight as possible when lying in bed work on range of motion and strengthening as tolerated ?Polar Care is much as you would like to keep swelling and pain down ?Call office if you are having any problems ?Expect wound VAC to last until your return visit in 1 week ? ? ? ? ? ? ?AMBULATORY SURGERY  ?DISCHARGE INSTRUCTIONS ? ? ?The drugs that you were given will stay in your system until tomorrow so for the next 24 hours you should not: ? ?Drive an automobile ?Make any legal decisions ?Drink any alcoholic beverage ? ? ?You may resume regular meals tomorrow.  Today it is better to start with liquids and gradually work up to solid foods. ? ?You may eat anything you prefer, but it is better to start with liquids, then soup and crackers, and gradually work up to solid foods. ? ? ?Please notify your doctor immediately if you have any unusual bleeding, trouble breathing, redness and pain at the surgery site, drainage, fever, or pain not relieved by medication. ? ?  ? ?Your post-operative visit with Dr.                     ? ? ?           ?     is: Date:                        Time:   ? ?Please call to schedule your post-operative visit. ? ?Additional Instructions:  ?

## 2021-04-20 NOTE — Anesthesia Procedure Notes (Signed)
Procedure Name: LMA Insertion ?Date/Time: 04/20/2021 12:28 PM ?Performed by: Nelda Marseille, CRNA ?Pre-anesthesia Checklist: Patient identified, Patient being monitored, Timeout performed, Emergency Drugs available and Suction available ?Patient Re-evaluated:Patient Re-evaluated prior to induction ?Oxygen Delivery Method: Circle system utilized ?Preoxygenation: Pre-oxygenation with 100% oxygen ?Induction Type: IV induction ?Ventilation: Mask ventilation without difficulty ?LMA: LMA inserted ?LMA Size: 4.0 ?Tube type: Oral ?Number of attempts: 1 ?Placement Confirmation: positive ETCO2 and breath sounds checked- equal and bilateral ?Tube secured with: Tape ?Dental Injury: Teeth and Oropharynx as per pre-operative assessment  ? ? ? ? ?

## 2021-04-20 NOTE — Anesthesia Preprocedure Evaluation (Signed)
Anesthesia Evaluation  ?Patient identified by MRN, date of birth, ID band ?Patient awake ? ? ? ?Reviewed: ?Allergy & Precautions, NPO status , Patient's Chart, lab work & pertinent test results ? ?History of Anesthesia Complications ?(+) history of anesthetic complications ? ?Airway ?Mallampati: III ? ?TM Distance: >3 FB ?Neck ROM: full ? ? ? Dental ? ?(+) Chipped, Poor Dentition, Missing ?  ?Pulmonary ?neg shortness of breath, former smoker,  ?  ?Pulmonary exam normal ? ? ? ? ? ? ? Cardiovascular ?Exercise Tolerance: Good ?hypertension, + CAD and +CHF  ?Normal cardiovascular exam+ dysrhythmias Atrial Fibrillation  ? ? ?  ?Neuro/Psych ?negative neurological ROS ? negative psych ROS  ? GI/Hepatic ?negative GI ROS, Neg liver ROS,   ?Endo/Other  ?negative endocrine ROS ? Renal/GU ?ARF and DialysisRenal disease  ? ?  ?Musculoskeletal ? ?(+) Arthritis ,  ? Abdominal ?  ?Peds ? Hematology ?negative hematology ROS ?(+)   ?Anesthesia Other Findings ?Past Medical History: ?No date: Aortic atherosclerosis ([place]) ?No date: Arthritis ?No date: Atrial fibrillation and flutter ([place]) ?    Comment:  a.) CHA2DS2-VASc = 5 (age x 2, CHF, HTN, aortic plaque). ?             b.) rate/rhythm maintain on oral carvedilol; chronically  ?             anticoagulated with dose reduced apixaban ?No date: BPH (benign prostatic hyperplasia) ?No date: Cardiac syncope ?No date: Cardiomyopathy [facility]) ?    Comment:  a.) TTE 04/23/2016: EF 40%. b.) TTE 09/03/2016: EF 50%.  ?             c.) TTE 05/04/2019: EF >55%. ?No date: CHF (congestive heart failure) ([place]) ?    Comment:  a.) TTE 04/23/2016: EF 40%; BAE; mild AR/TR, mod MR;  ?             G1DD. b.) TTE 09/03/2016: EF 50%; mild LVH; BAE; triv  ?             AR/PR, mod MR/TR. c.) TTE 05/04/2019: EF >55%; mod LVH,  ?             mild LA enlargement; mild AR/MR/TR ?No date: CKD (chronic kidney disease), stage IV ([place]) ?No date: Complication of anesthesia ?    Comment:   a.) h/o intra/postoperative A.fib ?No date: Coronary artery disease ?No date: Diverticulosis ?No date: Gout ?11/14/2020: History of 2019 novel coronavirus disease (COVID-19) ?No date: History of colon polyps ?No date: Hypercholesteremia ?No date: Hypertension ?No date: Long term current use of anticoagulant ?    Comment:  a.) dose reduced apixaban; dose reduction d/t to age and ?             CKD Dx. 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    Comment:  a.) s/p resection ?No date: MGUS (monoclonal gammopathy of unknown significance) ?No date: Pleural effusion ?No date: Prostatism ?No date: Rectal bleeding ? ?Past Surgical History: ?No date: ANKLE SURGERY ?    Comment:  following volleyball injury ?No date: APPENDECTOMY ?No date: COLONOSCOPY ?01/02/2017: COLONOSCOPY WITH PROPOFOL; N/A ?    Comment:  Procedure: COLONOSCOPY WITH PROPOFOL;  Surgeon: Vira Agar, ?             Gavin Pound, MD;  Location: [facility] ENDOSCOPY;  Service:  ?             Endoscopy;  Laterality: N/A; ?03/27/2021: DIALYSIS/PERMA CATHETER INSERTION; N/A ?    Comment:  Procedure: DIALYSIS/PERMA CATHETER  INSERTION;  Surgeon:  ?             Algernon Huxley, MD;  Location: [place] CV LAB;   ?             Service: Cardiovascular;  Laterality: N/A; ?No date: JOINT REPLACEMENT ?    Comment:  RIGHT TOTAL HIP ?No date: TONSILLECTOMY ?09/2009: TOTAL HIP ARTHROPLASTY ?    Comment:  Right femoral neck fracture ?03/06/2016: TOTAL HIP ARTHROPLASTY; Left ?    Comment:  Procedure: TOTAL HIP ARTHROPLASTY ANTERIOR APPROACH;   ?             Surgeon: Hessie Knows, MD;  Location: [facility] ORS;  Service: ?             Orthopedics;  Laterality: Left; ?03/21/2021: TOTAL KNEE ARTHROPLASTY; Right ?    Comment:  Procedure: TOTAL KNEE ARTHROPLASTY;  Surgeon: Rudene Christians,  ?             Legrand Como, MD;  Location: [facility] ORS;  Service: Orthopedics;  ?             Laterality: Right; ? ?BMI   ? Body Mass Index: 26.82 kg/m?  ?  ? ? Reproductive/Obstetrics ?negative OB ROS ? ?  ? ? ? ? ? ? ? ? ? ? ? ? ? ?  ?   ? ? ? ? ? ? ? ? ?Anesthesia Physical ?Anesthesia Plan ? ?ASA: 3 ? ?Anesthesia Plan: General LMA  ? ?Post-op Pain Management:   ? ?Induction: Intravenous ? ?PONV Risk Score and Plan: Dexamethasone, Ondansetron, Midazolam and Treatment may vary due to age or medical condition ? ?Airway Management Planned: LMA ? ?Additional Equipment:  ? ?Intra-op Plan:  ? ?Post-operative Plan: Extubation in OR ? ?Informed Consent: I have reviewed the patients History and Physical, chart, labs and discussed the procedure including the risks, benefits and alternatives for the proposed anesthesia with the patient or authorized representative who has indicated his/her understanding and acceptance.  ? ? ? ?Dental Advisory Given ? ?Plan Discussed with: Anesthesiologist, CRNA and Surgeon ? ?Anesthesia Plan Comments: (Patient consented for risks of anesthesia including but not limited to:  ?- adverse reactions to medications ?- damage to eyes, teeth, lips or other oral mucosa ?- nerve damage due to positioning  ?- sore throat or hoarseness ?- Damage to heart, brain, nerves, lungs, other parts of body or loss of life ? ?Patient voiced understanding.)  ? ? ? ? ? ? ?Anesthesia Quick Evaluation ? ?

## 2021-04-20 NOTE — H&P (Signed)
Chief Complaint  ?Patient presents with  ? Post Operative Visit  ?Right TKA 03/21/21 was working in yard thinks he over did it.  ? ?Lawrence Perkins. is a 81 y.o. male who presents today status post right total knee arthroplasty with Dr. Hessie Knows on 03/21/2021. Patient had been doing very well up until yesterday, was doing some yard work and developed some severe pain in his right knee, was unable to bear weight. He comes in today for recheck. Patella is dislocated laterally. He has a hard time bearing weight. He denies any pain in the lower leg. Incision site completely healed with no warmth erythema or drainage. ? ?Patient started dialysis in the hospital following his total knee replacement. He is continuing with dialysis. ? ?Past Medical History: ?Past Medical History:  ?Diagnosis Date  ? A-fib (CMS-HCC)  ? Atherosclerosis of abdominal aorta (CMS-HCC) 09/23/2017  ? Atrial fibrillation (CMS-HCC)  ? Atrial fibrillation (CMS-HCC)  ?intermittent with hip fx and GI bleed only at this point  ? BPH (benign prostatic hypertrophy)  ?Followed by [name]  ? Cardiac syncope 03/24/2019  ? Cardiomyopathy, secondary (CMS-HCC)  ? CHF (congestive heart failure) (CMS-HCC) 2018  ? Chronic a-fib (CMS-HCC)  ? Chronic kidney disease  ?Chronic Kidney disease, stage 4  ? Colon polyp 2012  ?adenomatous with high grade dysplasia  ? Coronary artery disease involving native coronary artery of native heart 09/23/2017  ? COVID-19 11/14/2020  ? Essential hypertension, benign  ? Hx of adenomatous polyp of colon 10/01/2016  ? Hx of colonic polyps  ?multiple including dysplasia  ? Hyperplastic polyp of intestine  ?Hx of 8 including 1 transverse colon polyps x3 suggestive of adenomatous change and low grade dysplasia. There is colonic mucosa with prominent lymphoid agragate, deeper sections examined and neg for dysplasia there.  ? Paroxysmal A-fib (CMS-HCC)  ? Pleural effusion  ? Pleural effusion, bilateral  ? Pure hypercholesterolemia   ? ?Past Surgical History: ?Past Surgical History:  ?Procedure Laterality Date  ? COLONOSCOPY 01/30/2010  ?Adenomatous Polyp w/High Grade Dysplasia  ? COLONOSCOPY 09/01/2010  ?Adenomatous Polyps  ? COLONOSCOPY 09/28/2013  ?Adenomatous Polyps: CBF 09/2016; Recall Ltr mailed 08/21/2016 (dw)  ? Removal of deep hardware and right total hip replacement Right 02/18/14  ? Total hip arthroplasty anterior approach Left 03/06/2016  ?Dr.Tracee Mccreery  ? COLONOSCOPY 01/02/2017  ?Adenomatous Polyps: CBF 01/2020  ? APPENDECTOMY  ? Hip Pinning  ?After non displaced traumatic fx 2011  ? TONSILLECTOMY  ? ?Past Family History: ?Family History  ?Problem Relation Age of Onset  ? Cirrhosis Mother  ? Alcohol abuse Mother  ? Liver disease Mother  ? Pulmonary fibrosis Father  ? Lung cancer Father  ? Stroke Brother  ? ?Medications: ?Current Outpatient Medications Ordered in Epic  ?Medication Sig Dispense Refill  ? apixaban (ELIQUIS) 2.5 mg tablet Take 1 tablet (2.5 mg total) by mouth 2 (two) times daily 180 tablet 4  ? calcium carbonate-vitamin D3 (CALTRATE 600+D) 600 mg(1,'500mg'$ ) -200 unit tablet Take 1 tablet by mouth 2 (two) times daily with meals  ? carvediloL (COREG) 3.125 MG tablet TAKE 1 TABLET TWICE DAILY WITH MEALS 180 tablet 1  ? cholecalciferol (VITAMIN D3) 2,000 unit capsule Take 2,000 Units by mouth once daily.  ? multivit-min-folic-vit K-lycop (ONE-A-DAY MEN'S 50 PLUS) 400-20-370 mcg Tab Take 1 tablet by mouth once daily  ? rosuvastatin (CRESTOR) 10 MG tablet TAKE 1 TABLET ONCE DAILY 90 tablet 3  ? tamsulosin (FLOMAX) 0.4 mg capsule Take 0.4 mg by mouth  once daily Take 30 minutes after same meal each day.  ? TORsemide (DEMADEX) 20 MG tablet TAKE 3 TABLETS ('60MG'$  TOTAL)ONCE DAILY (Patient taking differently: 20 mg 2 pills daily=40 mg) 270 tablet 1  ? ?No current Epic-ordered facility-administered medications on file.  ? ?Allergies: ?No Known Allergies  ? ?Review of Systems:  ?A comprehensive 14 point ROS was performed, reviewed by me today,  and the pertinent orthopaedic findings are documented in the HPI. ? ?Exam: ?BP 122/80  Ht 177.8 cm ('5\' 10"'$ )  Wt 89.8 kg (198 lb)  BMI 28.41 kg/m?  ?General:  ?Well developed, well nourished, no apparent distress, normal affect, normal gait with no antalgic component.  ? ?HEENT: ?Head normocephalic, atraumatic, PERRL.  ? ?Abdomen: ?Soft, non tender, non distended, Bowel sounds present. ? ?Heart: ?Examination of the heart reveals regular, rate, and rhythm. There is no murmur noted on ascultation. There is a normal apical pulse. ? ?Lungs: ?Lungs are clear to auscultation. There is no wheeze, rhonchi, or crackles. There is normal expansion of bilateral chest walls.  ? ?Right Lower Extremity: ?Examination of the right knee shows the patella is subluxed laterally and not in the trochlear groove. With the knee placed in extension and medial traction on the patella the patella was reduced. Extensor mechanism remains intact. Knee stable to valgus and varus stress testing. Negative Homans' sign with no swelling or edema throughout the lower leg. Incision site completely healed. ? ?AP lateral sunrise views of the right knee are ordered interpreted by me in the office today. Impression: Patient has underwent right total knee arthroplasty. Tibial and femoral components are intact with no evidence of loosening or subsidence. Patient has dislocated patella laterally. No evidence of periprosthetic fracture. ? ?Impression: ?Status post total right knee replacement [Z96.651] ?Status post total right knee replacement (primary encounter diagnosis) ?Patellar dislocation, right, initial encounter ? ?Plan:  ?66. 81 year old male status post right total knee arthroplasty 03/21/2021. He had been doing well and yesterday he was in the yard doing yard work and felt his patella dislocate laterally and was unable to bear weight. Today, x-rays negative for fracture. No significant effusion but is noted to have patella dislocation. This was  successfully reduced here in the clinic and patient was placed into a lateral J brace. With patella reduced, patient able to bear weight and go up and down steps here in the office. Discussed patella dislocation with patient and wife. Risks, benefits, complications of a right knee arthrotomy with lateral release and medial retinacular repair have been discussed with the patient. Patient has agreed and consented the procedure with Dr. Hessie Knows tomorrow. ?This note was generated in part with voice recognition software and I apologize for any typographical errors that were not detected and corrected. ? ?Feliberto Gottron MPA-C  ? ?Electronically signed by Feliberto Gottron, [place] at 04/19/2021 9:23 AM EDT  ? ? ?Reviewed  H+P. ?No changes noted. ? ? ?

## 2021-04-20 NOTE — Transfer of Care (Signed)
Immediate Anesthesia Transfer of Care Note ? ?Patient: Lawrence Perkins. ? ?Procedure(s) Performed: Right knee medial retinaculum and lateral release with polyethylene exchange (Right: Knee) ? ?Patient Location: PACU ? ?Anesthesia Type:General ? ?Level of Consciousness: sedated ? ?Airway & Oxygen Therapy: Patient Spontanous Breathing and Patient connected to face mask oxygen ? ?Post-op Assessment: Report given to RN and Post -op Vital signs reviewed and stable ? ?Post vital signs: Reviewed and stable ? ?Last Vitals:  ?Vitals Value Taken Time  ?BP    ?Temp    ?Pulse    ?Resp    ?SpO2    ? ? ?Last Pain:  ?Vitals:  ? 04/20/21 1010  ?TempSrc: Temporal  ?PainSc: 0-No pain  ?   ? ?  ? ?Complications: No notable events documented. ?

## 2021-04-20 NOTE — Anesthesia Postprocedure Evaluation (Signed)
Anesthesia Post Note ? ?Patient: Lawrence Perkins. ? ?Procedure(s) Performed: Right knee medial retinaculum and lateral release with polyethylene exchange (Right: Knee) ? ?Patient location during evaluation: PACU ?Anesthesia Type: General ?Level of consciousness: awake and alert ?Pain management: pain level controlled ?Vital Signs Assessment: post-procedure vital signs reviewed and stable ?Respiratory status: spontaneous breathing, nonlabored ventilation, respiratory function stable and patient connected to nasal cannula oxygen ?Cardiovascular status: blood pressure returned to baseline and stable ?Postop Assessment: no apparent nausea or vomiting ?Anesthetic complications: no ? ? ?No notable events documented. ? ? ?Last Vitals:  ?Vitals:  ? 04/20/21 1508 04/20/21 1525  ?BP:  (!) 169/87  ?Pulse: 94 76  ?Resp: 19 16  ?Temp:  36.4 ?C  ?SpO2: 96% 95%  ?  ?Last Pain:  ?Vitals:  ? 04/20/21 1525  ?TempSrc: Temporal  ?PainSc: 0-No pain  ? ? ?  ?  ?  ?  ?  ?  ? ?[name] ? ? ? ? ?

## 2021-04-20 NOTE — Op Note (Signed)
04/20/2021 ? ?1:22 PM ? ?PATIENT:  Lawrence Perkins.  81 y.o. male ? ?PRE-OPERATIVE DIAGNOSIS:  Status post total right knee replacement  Z96.651 ?Patellar dislocation, right, initial encounter S83.004A ? ?POST-OPERATIVE DIAGNOSIS:  Status post total right knee replacement  Z96.651 ?Patellar dislocation, right, initial encounter S83.004A MCL SPRAIN WITH KNEE LAXITY ? ?PROCEDURE:  Procedure(s): ?Right knee medial retinaculum and lateral release with polyethylene exchange (Right) ? ?SURGEON: Laurene Footman, MD ? ?ASSISTANTS: None ? ?ANESTHESIA:   general ? ?EBL:  Total I/O ?In: 900 [I.V.:800; IV Piggyback:100] ?Out: 15 [Blood:15] ? ?BLOOD ADMINISTERED:none ? ?DRAINS:  Incisional wound VAC   ? ?LOCAL MEDICATIONS USED:  NONE ? ?SPECIMEN:  No Specimen ? ?DISPOSITION OF SPECIMEN:  N/A ? ?COUNTS:  YES ? ?TOURNIQUET:  * Missing tourniquet times found for documented tourniquets in log: [id number] * ? ?IMPLANTS: Medacta GM K sphere size 5 tibial insert 13 mm thickness right ? ?DICTATION: .Dragon Dictation patient was brought to the operating room and after adequate anesthesia was obtained the right leg was prepped and draped in usual sterile fashion.  After patient identification and timeout procedure completed prior midline incision was made and a complete rupture of the medial retinaculum was identified.  Additionally there was laxity to the MCL consistent with sprain with opening to valgus stress in extension and mid flexion necessitating revision of polyethylene component.  After clot was removed and thorough irrigation of the knee the prior polyethylene was removed and a 13 mm trial placed having been 11 initially this gave excellent stability.  This was placed and the knee was then irrigated with Betadine wash prior to retinacular repair the retinaculum was repaired with multiple #2 Ethibond figure-of-eight repairs along the patella proximally and distally the there is no instability of the patella following this.  The  entire arthrotomy was then repaired using a heavy Quill with 3 oh V-Loc subcutaneously and skin staples.  Incisional wound VAC applied along with Polar Care. ? ?PLAN OF CARE: Discharge to home after PACU ? ?PATIENT DISPOSITION:  PACU - hemodynamically stable. ?  ? ?

## 2021-04-21 ENCOUNTER — Encounter: Payer: Self-pay | Admitting: Orthopedic Surgery

## 2021-04-24 DIAGNOSIS — N184 Chronic kidney disease, stage 4 (severe): Secondary | ICD-10-CM | POA: Diagnosis not present

## 2021-04-24 DIAGNOSIS — D631 Anemia in chronic kidney disease: Secondary | ICD-10-CM | POA: Diagnosis not present

## 2021-04-24 DIAGNOSIS — I10 Essential (primary) hypertension: Secondary | ICD-10-CM | POA: Diagnosis not present

## 2021-04-24 DIAGNOSIS — N179 Acute kidney failure, unspecified: Secondary | ICD-10-CM | POA: Diagnosis not present

## 2021-04-26 DIAGNOSIS — Z96651 Presence of right artificial knee joint: Secondary | ICD-10-CM | POA: Diagnosis not present

## 2021-04-28 DIAGNOSIS — Z96651 Presence of right artificial knee joint: Secondary | ICD-10-CM | POA: Diagnosis not present

## 2021-05-01 DIAGNOSIS — Z96651 Presence of right artificial knee joint: Secondary | ICD-10-CM | POA: Diagnosis not present

## 2021-05-03 DIAGNOSIS — M1711 Unilateral primary osteoarthritis, right knee: Secondary | ICD-10-CM | POA: Diagnosis not present

## 2021-05-03 DIAGNOSIS — Z96651 Presence of right artificial knee joint: Secondary | ICD-10-CM | POA: Diagnosis not present

## 2021-05-04 ENCOUNTER — Telehealth: Payer: Self-pay | Admitting: Oncology

## 2021-05-04 ENCOUNTER — Telehealth (INDEPENDENT_AMBULATORY_CARE_PROVIDER_SITE_OTHER): Payer: Self-pay

## 2021-05-04 ENCOUNTER — Inpatient Hospital Stay: Payer: Medicare HMO | Attending: Oncology

## 2021-05-04 DIAGNOSIS — D649 Anemia, unspecified: Secondary | ICD-10-CM | POA: Diagnosis not present

## 2021-05-04 DIAGNOSIS — D472 Monoclonal gammopathy: Secondary | ICD-10-CM | POA: Insufficient documentation

## 2021-05-04 LAB — BASIC METABOLIC PANEL
Anion gap: 6 (ref 5–15)
BUN: 43 mg/dL — ABNORMAL HIGH (ref 8–23)
CO2: 26 mmol/L (ref 22–32)
Calcium: 8.5 mg/dL — ABNORMAL LOW (ref 8.9–10.3)
Chloride: 104 mmol/L (ref 98–111)
Creatinine, Ser: 3.68 mg/dL — ABNORMAL HIGH (ref 0.61–1.24)
GFR, Estimated: 16 mL/min — ABNORMAL LOW (ref >60)
Glucose, Bld: 116 mg/dL — ABNORMAL HIGH (ref 70–99)
Potassium: 3.3 mmol/L — ABNORMAL LOW (ref 3.5–5.1)
Sodium: 136 mmol/L (ref 135–145)

## 2021-05-04 LAB — CBC WITH DIFFERENTIAL/PLATELET
Abs Immature Granulocytes: 0.03 10*3/uL (ref 0.00–0.07)
Basophils Absolute: 0 10*3/uL (ref 0.0–0.1)
Basophils Relative: 0 %
Eosinophils Absolute: 0.2 10*3/uL (ref 0.0–0.5)
Eosinophils Relative: 3 %
HCT: 29.1 % — ABNORMAL LOW (ref 39.0–52.0)
Hemoglobin: 9.6 g/dL — ABNORMAL LOW (ref 13.0–17.0)
Immature Granulocytes: 1 %
Lymphocytes Relative: 12 %
Lymphs Abs: 0.8 10*3/uL (ref 0.7–4.0)
MCH: 33.2 pg (ref 26.0–34.0)
MCHC: 33 g/dL (ref 30.0–36.0)
MCV: 100.7 fL — ABNORMAL HIGH (ref 80.0–100.0)
Monocytes Absolute: 0.3 10*3/uL (ref 0.1–1.0)
Monocytes Relative: 5 %
Neutro Abs: 4.9 10*3/uL (ref 1.7–7.7)
Neutrophils Relative %: 79 %
Platelets: 220 10*3/uL (ref 150–400)
RBC: 2.89 MIL/uL — ABNORMAL LOW (ref 4.22–5.81)
RDW: 14.6 % (ref 11.5–15.5)
WBC: 6.2 10*3/uL (ref 4.0–10.5)
nRBC: 0 % (ref 0.0–0.2)

## 2021-05-04 NOTE — Telephone Encounter (Signed)
Patient's spouse called and stated that Dr. Charlean Sanfilippo would like him to be seen earlier (previously scheduled for 1 yr f/u in OCT) due to recent lab work concerning for Anemia.  ? ?Patient has been rescheduled to repeat labs and see Faythe Casa on 5/9.  ? ?Routing to clinical team to make aware--please advise if there need to be any changes to appt, labs, etc.  ? ? ? ? ?

## 2021-05-04 NOTE — Telephone Encounter (Signed)
Spoke with the patient and he is scheduled with Dr. Lucky Cowboy for a permcath removal on 05/08/21 with a 8:15 am arrival to the MM. Pre-procedure instructions were discussed and patient understood. ?

## 2021-05-05 LAB — KAPPA/LAMBDA LIGHT CHAINS
Kappa free light chain: 103.8 mg/L — ABNORMAL HIGH (ref 3.3–19.4)
Kappa, lambda light chain ratio: 0.49 (ref 0.26–1.65)
Lambda free light chains: 213.5 mg/L — ABNORMAL HIGH (ref 5.7–26.3)

## 2021-05-05 LAB — IGG, IGA, IGM
IgA: 390 mg/dL (ref 61–437)
IgG (Immunoglobin G), Serum: 681 mg/dL (ref 603–1613)
IgM (Immunoglobulin M), Srm: 37 mg/dL (ref 15–143)

## 2021-05-08 ENCOUNTER — Encounter
Admission: RE | Disposition: A | Discharge status: Home / Self Care | Payer: Self-pay | Source: Home / Self Care | Attending: Vascular Surgery

## 2021-05-08 ENCOUNTER — Other Ambulatory Visit: Payer: Self-pay

## 2021-05-08 ENCOUNTER — Ambulatory Visit
Admission: RE | Admit: 2021-05-08 | Discharge: 2021-05-08 | Disposition: A | Discharge status: Home / Self Care | Payer: Medicare HMO | Attending: Vascular Surgery | Admitting: Vascular Surgery

## 2021-05-08 ENCOUNTER — Encounter: Payer: Self-pay | Admitting: Vascular Surgery

## 2021-05-08 DIAGNOSIS — Z4901 Encounter for fitting and adjustment of extracorporeal dialysis catheter: Secondary | ICD-10-CM | POA: Insufficient documentation

## 2021-05-08 DIAGNOSIS — Z87891 Personal history of nicotine dependence: Secondary | ICD-10-CM | POA: Insufficient documentation

## 2021-05-08 DIAGNOSIS — N186 End stage renal disease: Secondary | ICD-10-CM | POA: Diagnosis not present

## 2021-05-08 DIAGNOSIS — I132 Hypertensive heart and chronic kidney disease with heart failure and with stage 5 chronic kidney disease, or end stage renal disease: Secondary | ICD-10-CM | POA: Diagnosis not present

## 2021-05-08 DIAGNOSIS — I509 Heart failure, unspecified: Secondary | ICD-10-CM | POA: Insufficient documentation

## 2021-05-08 DIAGNOSIS — N179 Acute kidney failure, unspecified: Secondary | ICD-10-CM

## 2021-05-08 HISTORY — PX: DIALYSIS/PERMA CATHETER REMOVAL: CATH118289

## 2021-05-08 LAB — PROTEIN ELECTROPHORESIS, SERUM
A/G Ratio: 1.3 (ref 0.7–1.7)
Albumin ELP: 3.1 g/dL (ref 2.9–4.4)
Alpha-1-Globulin: 0.3 g/dL (ref 0.0–0.4)
Alpha-2-Globulin: 0.6 g/dL (ref 0.4–1.0)
Beta Globulin: 0.8 g/dL (ref 0.7–1.3)
Gamma Globulin: 0.7 g/dL (ref 0.4–1.8)
Globulin, Total: 2.4 g/dL (ref 2.2–3.9)
M-Spike, %: 0.3 g/dL — ABNORMAL HIGH
Total Protein ELP: 5.5 g/dL — ABNORMAL LOW (ref 6.0–8.5)

## 2021-05-08 SURGERY — DIALYSIS/PERMA CATHETER REMOVAL
Anesthesia: LOCAL

## 2021-05-08 MED ORDER — LIDOCAINE HCL (PF) 1 % IJ SOLN
INTRAMUSCULAR | Status: DC | PRN
  Administered 2021-05-08: 20 mL

## 2021-05-08 SURGICAL SUPPLY — 2 items
FORCEPS HALSTEAD CVD 5IN STRL (INSTRUMENTS) ×1 IMPLANT
TRAY LACERAT/PLASTIC (MISCELLANEOUS) ×1 IMPLANT

## 2021-05-08 NOTE — Op Note (Signed)
Operative Note ? ? ? ? ?Preoperative diagnosis:   1.  Chronic kidney disease with return of renal function no longer requiring dialysis ? ?Postoperative diagnosis:  1. same ? ?Procedure:  Removal of right jugular Permcath ? ?Surgeon:  Leotis Pain, MD ? ?Anesthesia:  Local ? ?EBL:  Minimal ? ?Indication for the Procedure:  The patient has chronic kidney disease but has had enough return of renal function that he no longer needs dialysis and is not using his PermCath.  This can be removed.  Risks and benefits are discussed and informed consent is obtained. ? ?Description of the Procedure:  The patient's right neck, chest and existing catheter were sterilely prepped and draped. The area around the catheter was anesthetized copiously with 1% lidocaine. The catheter was dissected out with curved hemostats until the cuff was freed from the surrounding fibrous sheath. The fiber sheath was transected, and the catheter was then removed in its entirety using gentle traction. Pressure was held and sterile dressings were placed. The patient tolerated the procedure well and was taken to the recovery room in stable condition. ? ? ? ? ?Leotis Pain ? ?05/08/2021, 11:28 AM ?This note was created with Dragon Medical transcription system. Any errors in dictation are purely unintentional.  ?

## 2021-05-08 NOTE — H&P (Signed)
?[facility] ?Admission History & Physical ? ?MRN : [id number] ? ?Lawrence Perkins. is a 81 y.o. ([birth year]) male who presents with chief complaint of No chief complaint on file. ?. ? ?History of Present Illness: I am asked to evaluate the patient by the dialysis center. The patient was sent here because they have a nonfunctioning tunneled catheter and an improvement in his renal function not currently requiring dialysis.   ?Patient denies pain or tenderness overlying the access.  There is no pain with dialysis when it was being performed.  The patient denies hand pain or finger pain consistent with steal syndrome.  No fevers or chills while on dialysis. ?  ? ?No current facility-administered medications for this encounter.  ? ? ?Past Medical History:  ?Diagnosis Date  ? Aortic atherosclerosis ([place])   ? Arthritis   ? Atrial fibrillation and flutter ([place])   ? a.) CHA2DS2-VASc = 5 (age x 2, CHF, HTN, aortic plaque). b.) rate/rhythm maintain on oral carvedilol; chronically anticoagulated with dose reduced apixaban  ? BPH (benign prostatic hyperplasia)   ? Cardiac syncope   ? Cardiomyopathy ([place])   ? a.) TTE 04/23/2016: EF 40%. b.) TTE 09/03/2016: EF 50%. c.) TTE 05/04/2019: EF >55%.  ? CHF (congestive heart failure) ([place])   ? a.) TTE 04/23/2016: EF 40%; BAE; mild AR/TR, mod MR; G1DD. b.) TTE 09/03/2016: EF 50%; mild LVH; BAE; triv AR/PR, mod MR/TR. c.) TTE 05/04/2019: EF >55%; mod LVH, mild LA enlargement; mild AR/MR/TR  ? CKD (chronic kidney disease), stage IV ([place])   ? Complication of anesthesia   ? a.) h/o intra/postoperative A.fib  ? Coronary artery disease   ? Diverticulosis   ? Gout   ? History of 2019 novel coronavirus disease (COVID-19) 11/14/2020  ? History of colon polyps   ? Hypercholesteremia   ? Hypertension   ? Long term current use of anticoagulant   ? a.) dose reduced apixaban; dose reduction d/t to age and CKD Dx.  ? 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 a.) s/p resection  ? MGUS  (monoclonal gammopathy of unknown significance)   ? Pleural effusion   ? Prostatism   ? Rectal bleeding   ? ? ?Past Surgical History:  ?Procedure Laterality Date  ? ANKLE SURGERY    ? following volleyball injury  ? APPENDECTOMY    ? COLONOSCOPY    ? COLONOSCOPY WITH PROPOFOL N/A 01/02/2017  ? Procedure: COLONOSCOPY WITH PROPOFOL;  Surgeon: Manya Silvas, MD;  Location: [facility] ENDOSCOPY;  Service: Endoscopy;  Laterality: N/A;  ? DIALYSIS/PERMA CATHETER INSERTION N/A 03/27/2021  ? Procedure: DIALYSIS/PERMA CATHETER INSERTION;  Surgeon: Algernon Huxley, MD;  Location: [facility];  Service: Cardiovascular;  Laterality: N/A;  ? JOINT REPLACEMENT    ? RIGHT TOTAL HIP  ? KNEE ARTHROSCOPY WITH LATERAL RELEASE Right 04/20/2021  ? Procedure: Right knee medial retinaculum and lateral release with polyethylene exchange;  Surgeon: Hessie Knows, MD;  Location: [facility] ORS;  Service: Orthopedics;  Laterality: Right;  ? TONSILLECTOMY    ? TOTAL HIP ARTHROPLASTY  09/2009  ? Right femoral neck fracture  ? TOTAL HIP ARTHROPLASTY Left 03/06/2016  ? Procedure: TOTAL HIP ARTHROPLASTY ANTERIOR APPROACH;  Surgeon: Hessie Knows, MD;  Location: [facility] ORS;  Service: Orthopedics;  Laterality: Left;  ? TOTAL KNEE ARTHROPLASTY Right 03/21/2021  ? Procedure: TOTAL KNEE ARTHROPLASTY;  Surgeon: Hessie Knows, MD;  Location: [facility] ORS;  Service: Orthopedics;  Laterality: Right;  ? ? ?Social History  ? ?  Tobacco Use  ? Smoking status: Former  ?  Years: 15.00  ?  Types: Cigarettes  ?  Quit date: 01/01/1981  ?  Years since quitting: 40.3  ? Smokeless tobacco: Never  ?Vaping Use  ? Vaping Use: Never used  ?Substance Use Topics  ? Alcohol use: Not Currently  ?  Alcohol/week: 2.0 standard drinks  ?  Types: 2 Cans of beer per week  ? Drug use: No  ? ? ?Family History  ?Problem Relation Age of Onset  ? Cirrhosis Mother   ? Other Father   ?     Lung Fibrosis  ? Prostate cancer Neg Hx   ? Kidney cancer Neg Hx   ? Bladder Cancer Neg Hx   ?  No family history of  bleeding or clotting disorders, autoimmune disease or porphyria ? ?No Known Allergies ? ? ?REVIEW OF SYSTEMS (Negative unless checked) ? ?Constitutional: '[]'$ Weight loss  '[]'$ Fever  '[]'$ Chills ?Cardiac: '[]'$ Chest pain   '[]'$ Chest pressure   '[]'$ Palpitations   '[]'$ Shortness of breath when laying flat   '[]'$ Shortness of breath at rest   '[x]'$ Shortness of breath with exertion. ?Vascular:  '[]'$ Pain in legs with walking   '[]'$ Pain in legs at rest   '[]'$ Pain in legs when laying flat   '[]'$ Claudication   '[]'$ Pain in feet when walking  '[]'$ Pain in feet at rest  '[]'$ Pain in feet when laying flat   '[]'$ History of DVT   '[]'$ Phlebitis   '[]'$ Swelling in legs   '[]'$ Varicose veins   '[]'$ Non-healing ulcers ?Pulmonary:   '[]'$ Uses home oxygen   '[]'$ Productive cough   '[]'$ Hemoptysis   '[]'$ Wheeze  '[]'$ COPD   '[]'$ Asthma ?Neurologic:  '[]'$ Dizziness  '[]'$ Blackouts   '[]'$ Seizures   '[]'$ History of stroke   '[]'$ History of TIA  '[]'$ Aphasia   '[]'$ Temporary blindness   '[]'$ Dysphagia   '[]'$ Weakness or numbness in arms   '[]'$ Weakness or numbness in legs ?Musculoskeletal:  '[]'$ Arthritis   '[]'$ Joint swelling   '[]'$ Joint pain   '[]'$ Low back pain ?Hematologic:  '[]'$ Easy bruising  '[]'$ Easy bleeding   '[]'$ Hypercoagulable state   '[x]'$ Anemic  '[]'$ Hepatitis ?Gastrointestinal:  '[]'$ Blood in stool   '[]'$ Vomiting blood  '[]'$ Gastroesophageal reflux/heartburn   '[]'$ Difficulty swallowing. ?Genitourinary:  '[x]'$ Chronic kidney disease   '[]'$ Difficult urination  '[]'$ Frequent urination  '[]'$ Burning with urination   '[]'$ Blood in urine ?Skin:  '[]'$ Rashes   '[]'$ Ulcers   '[]'$ Wounds ?Psychological:  '[]'$ History of anxiety   '[]'$  History of major depression. ? ?Physical Examination ? ?There were no vitals filed for this visit. ?There is no height or weight on file to calculate BMI. ?Gen: WD/WN, NAD ?Head: [place]/AT, No temporalis wasting. ?Ear/Nose/Throat: Hearing grossly intact, nares w/o erythema or drainage, oropharynx w/o Erythema/Exudate,  ?Eyes: Conjunctiva clear, sclera non-icteric ?Neck: Trachea midline.  No JVD.  ?Pulmonary:  Good air movement, respirations not labored, no use of  accessory muscles.  ?Cardiac: RRR, normal S1, S2. ?Vascular:  ?Vessel Right Left  ?Radial Palpable Palpable  ?Ulnar Not Palpable Not Palpable  ?Brachial Palpable Palpable  ?Carotid Palpable, without bruit Palpable, without bruit  ?Gastrointestinal: soft, non-tender/non-distended. No guarding/reflex.  ?Musculoskeletal: M/S 5/5 throughout.  Extremities without ischemic changes.  No deformity or atrophy.  ?Neurologic: Sensation grossly intact in extremities.  Symmetrical.  Speech is fluent. Motor exam as listed above. ?Psychiatric: Judgment intact, Mood & affect appropriate for pt's clinical situation. ?Dermatologic: No rashes or ulcers noted.  No cellulitis or open wounds. ? ? ? ?CBC ?Lab Results  ?Component Value Date  ? WBC 6.2 05/04/2021  ? HGB 9.6 (L) 05/04/2021  ?  HCT 29.1 (L) 05/04/2021  ? MCV 100.7 (H) 05/04/2021  ? PLT 220 05/04/2021  ? ? ?BMET ?   ?Component Value Date/Time  ? NA 136 05/04/2021 1428  ? NA 139 02/04/2014 1449  ? K 3.3 (L) 05/04/2021 1428  ? K 4.6 02/04/2014 1449  ? CL 104 05/04/2021 1428  ? CL 105 02/04/2014 1449  ? CO2 26 05/04/2021 1428  ? CO2 27 02/04/2014 1449  ? GLUCOSE 116 (H) 05/04/2021 1428  ? GLUCOSE 84 02/04/2014 1449  ? BUN 43 (H) 05/04/2021 1428  ? BUN 23 (H) 02/04/2014 1449  ? CREATININE 3.68 (H) 05/04/2021 1428  ? CREATININE 1.25 02/04/2014 1449  ? CALCIUM 8.5 (L) 05/04/2021 1428  ? CALCIUM 8.7 02/04/2014 1449  ? GFRNONAA 16 (L) 05/04/2021 1428  ? GFRNONAA >60 02/04/2014 1449  ? GFRAA 22 (L) 09/15/2019 0856  ? GFRAA >60 02/04/2014 1449  ? ?CrCl cannot be calculated (Unknown ideal weight.). ? ?COAG ?Lab Results  ?Component Value Date  ? INR 1.4 (H) 03/09/2021  ? INR 1.11 02/22/2016  ? INR 1.12 10/25/2015  ? ? ?Radiology ?DG Knee 1-2 Views Right ? ?Result Date: 04/20/2021 ?CLINICAL DATA:  Postop EXAM: RIGHT KNEE - 1-2 VIEW COMPARISON:  03/21/2021 FINDINGS: Status post right total knee arthroplasty. No evidence of periprosthetic fracture or lucency. Air within the soft tissues, not  unexpected postoperatively. Superficial skin staples. IMPRESSION: Status post right total knee arthroplasty. Electronically Signed   By: Merilyn Baba M.D.   On: 04/20/2021 14:09   ? ?Assessment/Plan ?1.  Complic

## 2021-05-09 ENCOUNTER — Inpatient Hospital Stay: Payer: Medicare HMO | Admitting: Nurse Practitioner

## 2021-05-09 ENCOUNTER — Encounter: Payer: Self-pay | Admitting: Nurse Practitioner

## 2021-05-09 ENCOUNTER — Inpatient Hospital Stay: Payer: Medicare HMO

## 2021-05-09 VITALS — BP 142/71 | HR 61 | Temp 99.6°F | Resp 16 | Wt 184.0 lb

## 2021-05-09 DIAGNOSIS — D631 Anemia in chronic kidney disease: Secondary | ICD-10-CM | POA: Diagnosis not present

## 2021-05-09 DIAGNOSIS — N184 Chronic kidney disease, stage 4 (severe): Secondary | ICD-10-CM

## 2021-05-09 DIAGNOSIS — D472 Monoclonal gammopathy: Secondary | ICD-10-CM

## 2021-05-09 DIAGNOSIS — D509 Iron deficiency anemia, unspecified: Secondary | ICD-10-CM | POA: Diagnosis not present

## 2021-05-09 DIAGNOSIS — D649 Anemia, unspecified: Secondary | ICD-10-CM | POA: Diagnosis not present

## 2021-05-09 DIAGNOSIS — Z96651 Presence of right artificial knee joint: Secondary | ICD-10-CM | POA: Diagnosis not present

## 2021-05-09 LAB — IRON AND TIBC
Iron: 73 ug/dL (ref 45–182)
Saturation Ratios: 25 % (ref 17.9–39.5)
TIBC: 293 ug/dL (ref 250–450)
UIBC: 220 ug/dL

## 2021-05-09 LAB — COMPREHENSIVE METABOLIC PANEL
ALT: 8 U/L (ref 0–44)
AST: 10 U/L — ABNORMAL LOW (ref 15–41)
Albumin: 3.4 g/dL — ABNORMAL LOW (ref 3.5–5.0)
Alkaline Phosphatase: 70 U/L (ref 38–126)
Anion gap: 7 (ref 5–15)
BUN: 50 mg/dL — ABNORMAL HIGH (ref 8–23)
CO2: 25 mmol/L (ref 22–32)
Calcium: 8.6 mg/dL — ABNORMAL LOW (ref 8.9–10.3)
Chloride: 103 mmol/L (ref 98–111)
Creatinine, Ser: 3.93 mg/dL — ABNORMAL HIGH (ref 0.61–1.24)
GFR, Estimated: 15 mL/min — ABNORMAL LOW (ref >60)
Glucose, Bld: 104 mg/dL — ABNORMAL HIGH (ref 70–99)
Potassium: 3.6 mmol/L (ref 3.5–5.1)
Sodium: 135 mmol/L (ref 135–145)
Total Bilirubin: 0.7 mg/dL (ref 0.3–1.2)
Total Protein: 6.4 g/dL — ABNORMAL LOW (ref 6.5–8.1)

## 2021-05-09 LAB — CBC WITH DIFFERENTIAL/PLATELET
Abs Immature Granulocytes: 0.03 10*3/uL (ref 0.00–0.07)
Basophils Absolute: 0 10*3/uL (ref 0.0–0.1)
Basophils Relative: 0 %
Eosinophils Absolute: 0.2 10*3/uL (ref 0.0–0.5)
Eosinophils Relative: 4 %
HCT: 30 % — ABNORMAL LOW (ref 39.0–52.0)
Hemoglobin: 9.8 g/dL — ABNORMAL LOW (ref 13.0–17.0)
Immature Granulocytes: 1 %
Lymphocytes Relative: 14 %
Lymphs Abs: 0.9 10*3/uL (ref 0.7–4.0)
MCH: 33.1 pg (ref 26.0–34.0)
MCHC: 32.7 g/dL (ref 30.0–36.0)
MCV: 101.4 fL — ABNORMAL HIGH (ref 80.0–100.0)
Monocytes Absolute: 0.4 10*3/uL (ref 0.1–1.0)
Monocytes Relative: 6 %
Neutro Abs: 5 10*3/uL (ref 1.7–7.7)
Neutrophils Relative %: 75 %
Platelets: 213 10*3/uL (ref 150–400)
RBC: 2.96 MIL/uL — ABNORMAL LOW (ref 4.22–5.81)
RDW: 14.9 % (ref 11.5–15.5)
WBC: 6.5 10*3/uL (ref 4.0–10.5)
nRBC: 0 % (ref 0.0–0.2)

## 2021-05-09 LAB — VITAMIN B12: Vitamin B-12: 235 pg/mL (ref 180–914)

## 2021-05-09 LAB — FERRITIN: Ferritin: 146 ng/mL (ref 24–336)

## 2021-05-09 NOTE — Progress Notes (Unsigned)
?[place] [facility]  ?[phone] [phone] ? ?ID: Lawrence Perkins. OB: October 09, 1940  MR#: [id number]  DDU#:[id number] ? ?Patient Care Team: ?Kirk Ruths, MD as PCP - General (Internal Medicine) ? ?CHIEF COMPLAINT: MGUS ? ?INTERVAL HISTORY: Patient returns to clinic today for repeat laboratory work and routine yearly evaluation.  He continues to feel well and remains asymptomatic. He has no neurologic complaints.  He denies any pain. He has a good appetite and denies weight loss.  He denies any chest pain, shortness of breath, cough, or hemoptysis.  He denies any nausea, vomiting, constipation, or diarrhea. He has no urinary complaints.  Patient feels at his baseline offers no specific complaints today. ? ?REVIEW OF SYSTEMS:   ?Review of Systems  ?Constitutional: Negative.  Negative for fever, malaise/fatigue and weight loss.  ?Respiratory: Negative.  Negative for cough and shortness of breath.   ?Cardiovascular: Negative.  Negative for chest pain and leg swelling.  ?Gastrointestinal: Negative.  Negative for abdominal pain.  ?Genitourinary: Negative.  Negative for dysuria.  ?Musculoskeletal: Negative.  Negative for back pain.  ?Skin: Negative.  Negative for rash.  ?Neurological: Negative.  Negative for focal weakness, weakness and headaches.  ?Endo/Heme/Allergies:  Does not bruise/bleed easily.  ?Psychiatric/Behavioral: Negative.  The patient is not nervous/anxious.   ? ?As per HPI. Otherwise, a complete review of systems is negative. ? ?PAST MEDICAL HISTORY: ?Past Medical History:  ?Diagnosis Date  ? Aortic atherosclerosis ([place])   ? Arthritis   ? Atrial fibrillation and flutter ([place])   ? a.) CHA2DS2-VASc = 5 (age x 2, CHF, HTN, aortic plaque). b.) rate/rhythm maintain on oral carvedilol; chronically anticoagulated with dose reduced apixaban  ? BPH (benign prostatic hyperplasia)   ? Cardiac syncope   ? Cardiomyopathy ([place])   ? a.) TTE 04/23/2016: EF 40%. b.) TTE  09/03/2016: EF 50%. c.) TTE 05/04/2019: EF >55%.  ? CHF (congestive heart failure) ([place])   ? a.) TTE 04/23/2016: EF 40%; BAE; mild AR/TR, mod MR; G1DD. b.) TTE 09/03/2016: EF 50%; mild LVH; BAE; triv AR/PR, mod MR/TR. c.) TTE 05/04/2019: EF >55%; mod LVH, mild LA enlargement; mild AR/MR/TR  ? CKD (chronic kidney disease), stage IV ([place])   ? Complication of anesthesia   ? a.) h/o intra/postoperative A.fib  ? Coronary artery disease   ? Diverticulosis   ? Gout   ? History of 2019 novel coronavirus disease (COVID-19) 11/14/2020  ? History of colon polyps   ? Hypercholesteremia   ? Hypertension   ? Long term current use of anticoagulant   ? a.) dose reduced apixaban; dose reduction d/t to age and CKD Dx.  ? 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 a.) s/p resection  ? MGUS (monoclonal gammopathy of unknown significance)   ? Pleural effusion   ? Prostatism   ? Rectal bleeding   ? ? ?PAST SURGICAL HISTORY: ?Past Surgical History:  ?Procedure Laterality Date  ? ANKLE SURGERY    ? following volleyball injury  ? APPENDECTOMY    ? COLONOSCOPY    ? COLONOSCOPY WITH PROPOFOL N/A 01/02/2017  ? Procedure: COLONOSCOPY WITH PROPOFOL;  Surgeon: Manya Silvas, MD;  Location: [facility] ENDOSCOPY;  Service: Endoscopy;  Laterality: N/A;  ? DIALYSIS/PERMA CATHETER INSERTION N/A 03/27/2021  ? Procedure: DIALYSIS/PERMA CATHETER INSERTION;  Surgeon: Algernon Huxley, MD;  Location: [facility];  Service: Cardiovascular;  Laterality: N/A;  ? DIALYSIS/PERMA CATHETER REMOVAL N/A 05/08/2021  ? Procedure: DIALYSIS/PERMA CATHETER REMOVAL;  Surgeon: Algernon Huxley, MD;  Location: [facility];  Service: Cardiovascular;  Laterality: N/A;  ? JOINT REPLACEMENT    ? RIGHT TOTAL HIP  ? KNEE ARTHROSCOPY WITH LATERAL RELEASE Right 04/20/2021  ? Procedure: Right knee medial retinaculum and lateral release with polyethylene exchange;  Surgeon: Hessie Knows, MD;  Location: [facility] ORS;  Service: Orthopedics;  Laterality: Right;  ? TONSILLECTOMY    ? TOTAL HIP  ARTHROPLASTY  09/2009  ? Right femoral neck fracture  ? TOTAL HIP ARTHROPLASTY Left 03/06/2016  ? Procedure: TOTAL HIP ARTHROPLASTY ANTERIOR APPROACH;  Surgeon: Hessie Knows, MD;  Location: [facility] ORS;  Service: Orthopedics;  Laterality: Left;  ? TOTAL KNEE ARTHROPLASTY Right 03/21/2021  ? Procedure: TOTAL KNEE ARTHROPLASTY;  Surgeon: Hessie Knows, MD;  Location: [facility] ORS;  Service: Orthopedics;  Laterality: Right;  ? ? ?FAMILY HISTORY: ?Family History  ?Problem Relation Age of Onset  ? Cirrhosis Mother   ? Other Father   ?     Lung Fibrosis  ? Prostate cancer Neg Hx   ? Kidney cancer Neg Hx   ? Bladder Cancer Neg Hx   ? ? ?ADVANCED DIRECTIVES (Y/N):  N ? ?HEALTH MAINTENANCE: ?Social History  ? ?Tobacco Use  ? Smoking status: Former  ?  Years: 15.00  ?  Types: Cigarettes  ?  Quit date: 01/01/1981  ?  Years since quitting: 40.3  ? Smokeless tobacco: Never  ?Vaping Use  ? Vaping Use: Never used  ?Substance Use Topics  ? Alcohol use: Not Currently  ?  Alcohol/week: 2.0 standard drinks  ?  Types: 2 Cans of beer per week  ? Drug use: No  ? ? ? Colonoscopy: ? PAP: ? Bone density: ? Lipid panel: ? ?No Known Allergies ? ?Current Outpatient Medications  ?Medication Sig Dispense Refill  ? amLODipine (NORVASC) 5 MG tablet Take 1 tablet by mouth daily.    ? apixaban (ELIQUIS) 2.5 MG TABS tablet Take 2.5 mg by mouth 2 (two) times daily.    ? Calcium Carbonate-Vitamin D 600-5 MG-MCG TABS Take by mouth.    ? carvedilol (COREG) 3.125 MG tablet Take 3.125 mg by mouth 2 (two) times daily with a meal.     ? cholecalciferol (VITAMIN D3) 25 MCG (1000 UNIT) tablet Take 1,000 Units by mouth daily.    ? Multiple Vitamin (MULTIVITAMIN WITH MINERALS) TABS tablet Take 1 tablet by mouth daily.    ? rosuvastatin (CRESTOR) 10 MG tablet Take 10 mg by mouth at bedtime.    ? tamsulosin (FLOMAX) 0.4 MG CAPS capsule Take 1 capsule (0.4 mg total) by mouth daily. 90 capsule 3  ? torsemide (DEMADEX) 20 MG tablet Take 2 tablets (40 mg total) by mouth daily. 60  tablet 0  ? hydrocortisone cream 1 % Apply 1 application topically 2 (two) times daily as needed for itching. (Patient not taking: Reported on 05/08/2021)    ? ?No current facility-administered medications for this visit.  ? ? ?OBJECTIVE: ?Vitals:  ? 05/09/21 1441  ?BP: (!) 142/71  ?Pulse: 61  ?Resp: 16  ?Temp: 99.6 ?F (37.6 ?C)  ?SpO2: 99%  ?   Body mass index is 26.4 kg/m?[name]    ECOG FS:0 - Asymptomatic ? ?General: Well-developed, well-nourished, no acute distress. ?Eyes: Pink conjunctiva, anicteric sclera. ?HEENT: Normocephalic, moist mucous membranes. ?Lungs: No audible wheezing or coughing. ?Heart: Regular rate and rhythm. ?Abdomen: Soft, nontender, no obvious distention. ?Musculoskeletal: No edema, cyanosis, or clubbing. ?Neuro: Alert, answering all questions appropriately. Cranial nerves grossly intact. ?Skin: No rashes or petechiae  noted. ?Psych: Normal affect. ? ?LAB RESULTS: ? ?Lab Results  ?Component Value Date  ? NA 136 05/04/2021  ? K 3.3 (L) 05/04/2021  ? CL 104 05/04/2021  ? CO2 26 05/04/2021  ? GLUCOSE 116 (H) 05/04/2021  ? BUN 43 (H) 05/04/2021  ? CREATININE 3.68 (H) 05/04/2021  ? CALCIUM 8.5 (L) 05/04/2021  ? PROT 6.4 (L) 03/09/2021  ? ALBUMIN 2.8 (L) 03/25/2021  ? AST 8 (L) 03/09/2021  ? ALT 10 03/09/2021  ? ALKPHOS 57 03/09/2021  ? BILITOT 0.5 03/09/2021  ? GFRNONAA 16 (L) 05/04/2021  ? GFRAA 22 (L) 09/15/2019  ? ? ?Lab Results  ?Component Value Date  ? WBC 6.2 05/04/2021  ? NEUTROABS 4.9 05/04/2021  ? HGB 9.6 (L) 05/04/2021  ? HCT 29.1 (L) 05/04/2021  ? MCV 100.7 (H) 05/04/2021  ? PLT 220 05/04/2021  ? ?Lab Results  ?Component Value Date  ? TOTALPROTELP 5.5 (L) 05/04/2021  ? ALBUMINELP 3.1 05/04/2021  ? A1GS 0.3 05/04/2021  ? A2GS 0.6 05/04/2021  ? BETS 0.8 05/04/2021  ? GAMS 0.7 05/04/2021  ? MSPIKE 0.3 (H) 05/04/2021  ? SPEI Comment 05/04/2021  ? ? ? ?STUDIES: ?DG Knee 1-2 Views Right ? ?Result Date: 04/20/2021 ?CLINICAL DATA:  Postop EXAM: RIGHT KNEE - 1-2 VIEW COMPARISON:  03/21/2021 FINDINGS:  Status post right total knee arthroplasty. No evidence of periprosthetic fracture or lucency. Air within the soft tissues, not unexpected postoperatively. Superficial skin staples. IMPRESSION: Status post right total knee

## 2021-05-10 DIAGNOSIS — N179 Acute kidney failure, unspecified: Secondary | ICD-10-CM | POA: Diagnosis not present

## 2021-05-10 DIAGNOSIS — I10 Essential (primary) hypertension: Secondary | ICD-10-CM | POA: Diagnosis not present

## 2021-05-10 DIAGNOSIS — N2581 Secondary hyperparathyroidism of renal origin: Secondary | ICD-10-CM | POA: Diagnosis not present

## 2021-05-10 DIAGNOSIS — N184 Chronic kidney disease, stage 4 (severe): Secondary | ICD-10-CM | POA: Diagnosis not present

## 2021-05-10 DIAGNOSIS — D631 Anemia in chronic kidney disease: Secondary | ICD-10-CM | POA: Diagnosis not present

## 2021-05-12 DIAGNOSIS — Z96651 Presence of right artificial knee joint: Secondary | ICD-10-CM | POA: Diagnosis not present

## 2021-05-15 ENCOUNTER — Encounter: Payer: Self-pay | Admitting: Nurse Practitioner

## 2021-05-15 DIAGNOSIS — Z96651 Presence of right artificial knee joint: Secondary | ICD-10-CM | POA: Diagnosis not present

## 2021-05-18 DIAGNOSIS — Z96651 Presence of right artificial knee joint: Secondary | ICD-10-CM | POA: Diagnosis not present

## 2021-05-19 ENCOUNTER — Other Ambulatory Visit: Payer: Self-pay

## 2021-05-19 ENCOUNTER — Emergency Department: Payer: PRIVATE HEALTH INSURANCE

## 2021-05-19 ENCOUNTER — Emergency Department
Admission: EM | Admit: 2021-05-19 | Discharge: 2021-05-19 | Disposition: A | Discharge status: Home / Self Care | Payer: PRIVATE HEALTH INSURANCE | Attending: Emergency Medicine | Admitting: Emergency Medicine

## 2021-05-19 DIAGNOSIS — S8991XA Unspecified injury of right lower leg, initial encounter: Secondary | ICD-10-CM | POA: Diagnosis present

## 2021-05-19 DIAGNOSIS — Z7901 Long term (current) use of anticoagulants: Secondary | ICD-10-CM | POA: Insufficient documentation

## 2021-05-19 DIAGNOSIS — S81011A Laceration without foreign body, right knee, initial encounter: Secondary | ICD-10-CM | POA: Insufficient documentation

## 2021-05-19 DIAGNOSIS — W1839XA Other fall on same level, initial encounter: Secondary | ICD-10-CM | POA: Insufficient documentation

## 2021-05-19 DIAGNOSIS — Z043 Encounter for examination and observation following other accident: Secondary | ICD-10-CM | POA: Diagnosis not present

## 2021-05-19 DIAGNOSIS — M25561 Pain in right knee: Secondary | ICD-10-CM | POA: Diagnosis not present

## 2021-05-19 DIAGNOSIS — Y9301 Activity, walking, marching and hiking: Secondary | ICD-10-CM | POA: Insufficient documentation

## 2021-05-19 DIAGNOSIS — S5001XA Contusion of right elbow, initial encounter: Secondary | ICD-10-CM | POA: Insufficient documentation

## 2021-05-19 DIAGNOSIS — S50311A Abrasion of right elbow, initial encounter: Secondary | ICD-10-CM

## 2021-05-19 DIAGNOSIS — I2510 Atherosclerotic heart disease of native coronary artery without angina pectoris: Secondary | ICD-10-CM | POA: Insufficient documentation

## 2021-05-19 DIAGNOSIS — Z96651 Presence of right artificial knee joint: Secondary | ICD-10-CM | POA: Diagnosis not present

## 2021-05-19 DIAGNOSIS — Z96659 Presence of unspecified artificial knee joint: Secondary | ICD-10-CM | POA: Insufficient documentation

## 2021-05-19 DIAGNOSIS — S8001XA Contusion of right knee, initial encounter: Secondary | ICD-10-CM | POA: Diagnosis not present

## 2021-05-19 NOTE — ED Provider Triage Note (Signed)
Emergency Medicine Provider Triage Evaluation Note  Lawrence Perkins. , a 81 y.o. male  was evaluated in triage.  Pt complains of left knee and elbow pain s/p mechanical fall.  Patient apparently tripped on a carpet on the floor, causing him to land on his left knee and elbow.  He is 3 weeks status post TKR on the left, and presents with some mild incision site bleeding, secondary to daily Eliquis.  He also presents with a skin tear to the left elbow.  He denies any head injury or LOC.  Review of Systems  Positive: Left knee contusion, left elbow contusion Negative: Head injury, LOC  Physical Exam  BP (!) 147/89 (BP Location: Right Arm)   Pulse (!) 57   Temp 97.8 F (36.6 C) (Oral)   Resp 16   Ht '5\' 9"'$  (1.753 m)   Wt 83.4 kg   SpO2 99%   BMI 27.15 kg/m  Gen:   Awake, no distress   Resp:  Normal effort  MSK:   Moves extremities without difficulty Left knee scar with minimal dehiscence/bleeding. Skin tear left elbow. Normal ROM LU/LLE Other:    Medical Decision Making  Medically screening exam initiated at 4:11 PM.  Appropriate orders placed.  [name] was informed that the remainder of the evaluation will be completed by another provider, this initial triage assessment does not replace that evaluation, and the importance of remaining in the ED until their evaluation is complete.  Left knee and left elbow injury status post mechanical fall.  Some mild bleeding from the left knee surgical scar noted.  Left elbow skin tear appreciated.   [name], PA-C 05/19/21 1614

## 2021-05-19 NOTE — ED Provider Notes (Signed)
[facility] Provider Note    Event Date/Time   First MD Initiated Contact with Patient 05/19/21 1805     (approximate)   History   Fall and Laceration   HPI  Lawrence Albers. is a 81 y.o. male with history of A-fib on Eliquis, CAD and as listed in EMR presents to the emergency department for treatment and evaluation after a mechanical, nonsyncopal fall.  He is a Psychologist, occupational here at the hospital and was walking inside from the courtesy car when his foot caught on the mat causing him to fall forward.  He landed on his right elbow and right knee.  He is 3 months postop after knee replacement.  He has an abrasion to the right elbow and a small laceration at the incision site of the right knee.  Bleeding is currently well controlled.  He is unsure of his last Tdap.  He denies head injury, loss of consciousness, or headache..      Physical Exam   Triage Vital Signs: ED Triage Vitals  Enc Vitals Group     BP 05/19/21 1609 (!) 147/89     Pulse Rate 05/19/21 1609 (!) 57     Resp 05/19/21 1609 16     Temp 05/19/21 1609 97.8 F (36.6 C)     Temp Source 05/19/21 1609 Oral     SpO2 05/19/21 1609 99 %     Weight 05/19/21 1610 183 lb 13.8 oz (83.4 kg)     Height 05/19/21 1610 '5\' 9"'$  (1.753 m)     Head Circumference --      Peak Flow --      Pain Score 05/19/21 1610 3     Pain Loc --      Pain Edu? --      Excl. in [place]? --     Most recent vital signs: Vitals:   05/19/21 1609 05/19/21 1923  BP: (!) 147/89 (!) 144/83  Pulse: (!) 57 61  Resp: 16 16  Temp: 97.8 F (36.6 C)   SpO2: 99% 99%    General: Awake, no distress.  CV:  Good peripheral perfusion.  Resp:  Normal effort.  Abd:  No distention.  Other:  Abrasion to the right elbow without active bleeding.  Hematoma noted to the right elbow.  Superficial laceration in the incision line of the right knee that measures approximately 1.5 cm.  Scant amount of blood oozing from that site.   ED Results /  Procedures / Treatments   Labs (all labs ordered are listed, but only abnormal results are displayed) Labs Reviewed - No data to display   EKG  Not indicated   RADIOLOGY  Image and radiology report reviewed by me.  Image of the right elbow shows no acute concerns on my interpretation.  Image of the right knee shows no acute concerns on my interpretation.  PROCEDURES:  Critical Care performed: No  Procedures Exam shows a hematoma and skin tear to the right elbow.  This was cleaned with Hibiclens and saline then nonstick dressing and Coban was applied.  Superficial laceration of the right knee was cleaned with Hibiclens as well then derma clips and Dermabond were used for closures.  MEDICATIONS ORDERED IN ED: Medications - No data to display   IMPRESSION / MDM / [place] / ED COURSE   I have reviewed the triage note.  Differential diagnosis includes, but is not limited to: Elbow fracture, patella fracture, disruption of hardware in  the right knee  81 year old male presenting to the emergency department after mechanical, nonsyncopal fall while walking into the hospital earlier.  See HPI for further details.  Exam shows a hematoma and skin tear to the right elbow.  This was cleaned with Hibiclens and saline then nonstick dressing and Coban was applied.  Superficial laceration of the right knee was cleaned with Hibiclens as well then derma clips and Dermabond were used for closures.  Performing a CT of the head and cervical spine was discussed with the patient who declines at this time.  He states that he did not hit his head and he does not have a headache.  He was advised that after a fall when someone is on Eliquis that it is recommended.  He states that if he develops any symptoms of concern he will return to the emergency department.  He is unsure of his last Tdap but would like to check with his primary care provider before receiving another 1 here  today.  Wound care was discussed.  He is to follow-up with primary care in about a week for wound check.  For elbow or knee pain that is not improving with rest, ice, elevation, and time he is to follow-up with his orthopedist.  ER return precautions were discussed and patient discharged home.      FINAL CLINICAL IMPRESSION(S) / ED DIAGNOSES   Final diagnoses:  Knee laceration, right, initial encounter  Traumatic hematoma of elbow, right, initial encounter  Abrasion of right elbow, initial encounter     Rx / DC Orders   ED Discharge Orders     None        Note:  This document was prepared using Dragon voice recognition software and may include unintentional dictation errors.   Lawrence Dike, FNP 05/19/21 1933    Vanessa Olean, MD 05/20/21 [phone]

## 2021-05-19 NOTE — ED Triage Notes (Signed)
Pt is a volunteer here driving the courtesy car, pt states when he came inside his foot caught the carpet and he fell forward landing on his right elbow and right knee that he just had surg on 3 weeks ago, pt is oozing from the scar of the incision site, pt states that he takes eliquis

## 2021-05-25 ENCOUNTER — Telehealth: Payer: Self-pay

## 2021-05-25 NOTE — Telephone Encounter (Signed)
-----   Message from Verlon Au, NP sent at 05/23/2021  4:43 PM EDT ----- Please let patient know that b12 is low. Recommend he start daily otc tablet. Thanks! Lauren  ----- Message ----- From: Buel Ream, Lab In [place] Sent: 05/09/2021   3:25 PM EDT To: Verlon Au, NP

## 2021-06-05 NOTE — Progress Notes (Unsigned)
01/24/2018 1:15 PM   Lawrence Perkins. [birth year] [id number]  Referring provider: Kirk Ruths, MD [facility] [place],   [id number]  Urological history 1. BPH with LU TS - PSA 0.95 in 2016 - discontinued screening due to age -prostate volume 40 grms - CT, 2022 - I PSS *** - PVR *** mL - managed with tamsulosin 0.4 mg daily  2. High risk hematuria -former smoker -non-contrast CT, 2022 (no contrast due to CKD) - no worrisome findings -cysto, 2022 - subtle dilation of ejaculatory duct but no definite papillary tumors -urine cytology, 2022 - NED -no reports of gross heme -UA ***  3. Nephrolithiasis -2 mm right lower pole stone on 2022 CT  4. Renal cyst -Exophytic right lower pole renal cyst which appears to be involuting is slightly decreased in size compared to prior, now measuring 1.8 x 1.1 cm previously 1.8 x 1.5 cm on 2022 CT   No chief complaint on file.    HPI: Lawrence Perkins. is a 81 y.o. male with BPH with LU TS who presents today for an annual exam.  UA ***  PVR ***   Score:  1-7 Mild 8-19 Moderate 20-35 Severe      PMH: Past Medical History:  Diagnosis Date   Aortic atherosclerosis (HCC)    Arthritis    Atrial fibrillation and flutter (HCC)    a.) CHA2DS2-VASc = 5 (age x 2, CHF, HTN, aortic plaque). b.) rate/rhythm maintain on oral carvedilol; chronically anticoagulated with dose reduced apixaban   BPH (benign prostatic hyperplasia)    Cardiac syncope    Cardiomyopathy ([place])    a.) TTE 04/23/2016: EF 40%. b.) TTE 09/03/2016: EF 50%. c.) TTE 05/04/2019: EF >55%.   CHF (congestive heart failure) ([place])    a.) TTE 04/23/2016: EF 40%; BAE; mild AR/TR, mod MR; G1DD. b.) TTE 09/03/2016: EF 50%; mild LVH; BAE; triv AR/PR, mod MR/TR. c.) TTE 05/04/2019: EF >55%; mod LVH, mild LA enlargement; mild AR/MR/TR   CKD (chronic kidney disease), stage IV (HCC)    Complication of anesthesia    a.) h/o  intra/postoperative A.fib   Coronary artery disease    Diverticulosis    Gout    History of 2019 novel coronavirus disease (COVID-19) 11/14/2020   History of colon polyps    Hypercholesteremia    Hypertension    Long term current use of anticoagulant    a.) dose reduced apixaban; dose reduction d/t to age and CKD Dx.   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[place])    a.) s/p resection   MGUS (monoclonal gammopathy of unknown significance)    Pleural effusion    Prostatism    Rectal bleeding     Surgical History: Past Surgical History:  Procedure Laterality Date   ANKLE SURGERY     following volleyball injury   APPENDECTOMY     COLONOSCOPY     COLONOSCOPY WITH PROPOFOL N/A 01/02/2017   Procedure: COLONOSCOPY WITH PROPOFOL;  Surgeon: Manya Silvas, MD;  [facility];  Service: Endoscopy;  Laterality: N/A;   DIALYSIS/PERMA CATHETER INSERTION N/A 03/27/2021   Procedure: DIALYSIS/PERMA CATHETER INSERTION;  Surgeon: Algernon Huxley, MD;  Location: [place] [facility];  Service: Cardiovascular;  Laterality: N/A;   DIALYSIS/PERMA CATHETER REMOVAL N/A 05/08/2021   Procedure: DIALYSIS/PERMA CATHETER REMOVAL;  Surgeon: Algernon Huxley, MD;  Location: [place] [facility];  Service: Cardiovascular;  Laterality: N/A;   JOINT REPLACEMENT  RIGHT TOTAL HIP   KNEE ARTHROSCOPY WITH LATERAL RELEASE Right 04/20/2021   Procedure: Right knee medial retinaculum and lateral release with polyethylene exchange;  Surgeon: Hessie Knows, MD;  Location: [facility] ORS;  Service: Orthopedics;  Laterality: Right;   TONSILLECTOMY     TOTAL HIP ARTHROPLASTY  09/2009   Right femoral neck fracture   TOTAL HIP ARTHROPLASTY Left 03/06/2016   Procedure: TOTAL HIP ARTHROPLASTY ANTERIOR APPROACH;  Surgeon: Hessie Knows, MD;  Location: [facility] ORS;  Service: Orthopedics;  Laterality: Left;   TOTAL KNEE ARTHROPLASTY Right 03/21/2021   Procedure: TOTAL KNEE ARTHROPLASTY;  Surgeon: Hessie Knows, MD;  Location: [facility] ORS;  Service: Orthopedics;   Laterality: Right;    Home Medications:  Allergies as of 06/06/2021   No Known Allergies      Medication List        Accurate as of June 05, 2021  1:15 PM. If you have any questions, ask your nurse or doctor.          amLODipine 5 MG tablet Commonly known as: NORVASC Take 1 tablet by mouth daily.   apixaban 2.5 MG Tabs tablet Commonly known as: ELIQUIS Take 2.5 mg by mouth 2 (two) times daily.   Calcium Carbonate-Vitamin D 600-5 MG-MCG Tabs Take by mouth.   carvedilol 3.125 MG tablet Commonly known as: COREG Take 3.125 mg by mouth 2 (two) times daily with a meal.   cholecalciferol 25 MCG (1000 UNIT) tablet Commonly known as: VITAMIN D3 Take 1,000 Units by mouth daily.   hydrocortisone cream 1 % Apply 1 application topically 2 (two) times daily as needed for itching.   multivitamin with minerals Tabs tablet Take 1 tablet by mouth daily.   rosuvastatin 10 MG tablet Commonly known as: CRESTOR Take 10 mg by mouth at bedtime.   tamsulosin 0.4 MG Caps capsule Commonly known as: FLOMAX Take 1 capsule (0.4 mg total) by mouth daily.   torsemide 20 MG tablet Commonly known as: DEMADEX Take 2 tablets (40 mg total) by mouth daily.        Allergies: No Known Allergies  Family History: Family History  Problem Relation Age of Onset   Cirrhosis Mother    Other Father        Lung Fibrosis   Prostate cancer Neg Hx    Kidney cancer Neg Hx    Bladder Cancer Neg Hx     Social History:  reports that he quit smoking about 40 years ago. His smoking use included cigarettes. He has never used smokeless tobacco. He reports that he does not currently use alcohol after a past usage of about 2.0 standard drinks per week. He reports that he does not use drugs.  For pertinent review of systems please refer to history of present illness  Physical Exam: There were no vitals taken for this visit.  Constitutional:  Well nourished. Alert and oriented, No acute distress. HEENT:  [place] AT, moist mucus membranes.  Trachea midline Cardiovascular: No clubbing, cyanosis, or edema. Respiratory: Normal respiratory effort, no increased work of breathing. GU: No CVA tenderness.  No bladder fullness or masses.  Patient with circumcised/uncircumcised phallus. ***Foreskin easily retracted***  Urethral meatus is patent.  No penile discharge. No penile lesions or rashes. Scrotum without lesions, cysts, rashes and/or edema.  Testicles are located scrotally bilaterally. No masses are appreciated in the testicles. Left and right epididymis are normal. Rectal: Patient with  normal sphincter tone. Anus and perineum without scarring or rashes. No rectal masses are appreciated. Prostate  is approximately *** grams, *** nodules are appreciated. Seminal vesicles are normal. Neurologic: Grossly intact, no focal deficits, moving all 4 extremities. Psychiatric: Normal mood and affect.   Laboratory Data: Lab Results  Component Value Date   WBC 6.5 05/09/2021   HGB 9.8 (L) 05/09/2021   HCT 30.0 (L) 05/09/2021   MCV 101.4 (H) 05/09/2021   PLT 213 05/09/2021    Lab Results  Component Value Date   CREATININE 3.93 (H) 05/09/2021    Urinalysis *** I have reviewed the labs.  Pertinent Imaging ***  Assessment & Plan:    1. High risk hematuria -work up 2022, NED -no reports of gross heme -UA***  2. BPH with LUTS -PSA stable -DRE benign -UA benign -PVR < 300 cc -symptoms - *** -most bothersome symptoms are *** -continue conservative management, avoiding bladder irritants and timed voiding's -Continue tamsulosin 0.4 mg daily  3. Right renal stone -asymptomatic     No follow-ups on file.  These notes generated with voice recognition software. I apologize for typographical errors.  Zara Council, PA-C  [facility] [address] [place], [place] [address] [phone]

## 2021-06-06 ENCOUNTER — Ambulatory Visit: Payer: Medicare HMO | Admitting: Urology

## 2021-06-06 ENCOUNTER — Encounter: Payer: Self-pay | Admitting: Urology

## 2021-06-06 VITALS — BP 149/72 | HR 55 | Ht 69.0 in | Wt 195.0 lb

## 2021-06-06 DIAGNOSIS — N200 Calculus of kidney: Secondary | ICD-10-CM

## 2021-06-06 DIAGNOSIS — N138 Other obstructive and reflux uropathy: Secondary | ICD-10-CM

## 2021-06-06 DIAGNOSIS — N401 Enlarged prostate with lower urinary tract symptoms: Secondary | ICD-10-CM

## 2021-06-06 DIAGNOSIS — R319 Hematuria, unspecified: Secondary | ICD-10-CM

## 2021-06-06 LAB — MICROSCOPIC EXAMINATION: Bacteria, UA: NONE SEEN

## 2021-06-06 LAB — URINALYSIS, COMPLETE
Bilirubin, UA: NEGATIVE
Glucose, UA: NEGATIVE
Ketones, UA: NEGATIVE
Leukocytes,UA: NEGATIVE
Nitrite, UA: NEGATIVE
Specific Gravity, UA: 1.025 (ref 1.005–1.030)
Urobilinogen, Ur: 0.2 mg/dL (ref 0.2–1.0)
pH, UA: 6 (ref 5.0–7.5)

## 2021-06-06 LAB — BLADDER SCAN AMB NON-IMAGING: Scan Result: 179

## 2021-06-06 MED ORDER — TAMSULOSIN HCL 0.4 MG PO CAPS: 0.4000 mg | ORAL_CAPSULE | Freq: Every day | ORAL | 3 refills | Status: DC

## 2021-06-20 DIAGNOSIS — N184 Chronic kidney disease, stage 4 (severe): Secondary | ICD-10-CM | POA: Diagnosis not present

## 2021-06-20 DIAGNOSIS — N179 Acute kidney failure, unspecified: Secondary | ICD-10-CM | POA: Diagnosis not present

## 2021-06-20 DIAGNOSIS — I10 Essential (primary) hypertension: Secondary | ICD-10-CM | POA: Diagnosis not present

## 2021-06-20 DIAGNOSIS — D631 Anemia in chronic kidney disease: Secondary | ICD-10-CM | POA: Diagnosis not present

## 2021-06-20 DIAGNOSIS — N2581 Secondary hyperparathyroidism of renal origin: Secondary | ICD-10-CM | POA: Diagnosis not present

## 2021-06-29 DIAGNOSIS — I129 Hypertensive chronic kidney disease with stage 1 through stage 4 chronic kidney disease, or unspecified chronic kidney disease: Secondary | ICD-10-CM | POA: Diagnosis not present

## 2021-06-29 DIAGNOSIS — I2510 Atherosclerotic heart disease of native coronary artery without angina pectoris: Secondary | ICD-10-CM | POA: Diagnosis not present

## 2021-06-29 DIAGNOSIS — I5022 Chronic systolic (congestive) heart failure: Secondary | ICD-10-CM | POA: Diagnosis not present

## 2021-06-29 DIAGNOSIS — N184 Chronic kidney disease, stage 4 (severe): Secondary | ICD-10-CM | POA: Diagnosis not present

## 2021-06-29 DIAGNOSIS — I4821 Permanent atrial fibrillation: Secondary | ICD-10-CM | POA: Diagnosis not present

## 2021-06-29 DIAGNOSIS — I700 Atherosclerosis of aorta: Secondary | ICD-10-CM | POA: Diagnosis not present

## 2021-06-29 DIAGNOSIS — E7800 Pure hypercholesterolemia, unspecified: Secondary | ICD-10-CM | POA: Diagnosis not present

## 2021-07-05 ENCOUNTER — Ambulatory Visit: Payer: Medicare HMO | Admitting: Urology

## 2021-07-05 DIAGNOSIS — N179 Acute kidney failure, unspecified: Secondary | ICD-10-CM | POA: Diagnosis not present

## 2021-07-05 DIAGNOSIS — N184 Chronic kidney disease, stage 4 (severe): Secondary | ICD-10-CM | POA: Diagnosis not present

## 2021-07-11 DIAGNOSIS — N185 Chronic kidney disease, stage 5: Secondary | ICD-10-CM | POA: Diagnosis not present

## 2021-07-11 DIAGNOSIS — D631 Anemia in chronic kidney disease: Secondary | ICD-10-CM | POA: Diagnosis not present

## 2021-07-11 DIAGNOSIS — N2581 Secondary hyperparathyroidism of renal origin: Secondary | ICD-10-CM | POA: Diagnosis not present

## 2021-07-11 DIAGNOSIS — I10 Essential (primary) hypertension: Secondary | ICD-10-CM | POA: Diagnosis not present

## 2021-07-17 ENCOUNTER — Ambulatory Visit (INDEPENDENT_AMBULATORY_CARE_PROVIDER_SITE_OTHER): Payer: Medicare HMO | Admitting: Surgery

## 2021-07-17 ENCOUNTER — Encounter: Payer: Self-pay | Admitting: Surgery

## 2021-07-17 VITALS — BP 131/72 | HR 67 | Temp 98.7°F | Ht 70.0 in | Wt 179.8 lb

## 2021-07-17 DIAGNOSIS — N186 End stage renal disease: Secondary | ICD-10-CM

## 2021-07-17 DIAGNOSIS — Z992 Dependence on renal dialysis: Secondary | ICD-10-CM | POA: Diagnosis not present

## 2021-07-17 NOTE — Patient Instructions (Signed)
Our surgery scheduler Pamala Hurry will call you within 24-48 hours to get you scheduled. If you have not heard from her after 48 hours, please call our office. Have the blue sheet available when she calls to write down important information.  If you have any concerns or questions, please feel free to call our office.   Peritoneal Dialysis Catheter Placement  Peritoneal dialysis catheter placement is a surgery to insert a thin, flexible tube (catheter) into the abdomen. The catheter will be used for peritoneal dialysis, which is a process for filtering the blood. The catheter is small, soft, and easy to conceal. The catheter placement is usually done at least 2 weeks before peritoneal dialysis is started. During dialysis, wastes, salt, and extra water are removed from the blood. In peritoneal dialysis, these tasks are performed by transferring a fluid (dialysate) to and from the abdomen during each session. The fluid goes through the catheter to enter the abdomen at the start of each dialysis session, and it drains out of the body through the catheter at the end of each session. This procedure is done using one of the following techniques: Open technique. This is when the surgery is performed through one large incision. Laparoscopic technique. This is when smaller incisions are made and a tube with a light and camera (laparoscope) is inserted through one of the incisions to help perform the surgery. The camera sends images to a video screen in the operating room. This lets the surgeon see inside the abdomen during the procedure. Tell a health care provider about: Any allergies you have. All medicines you are taking, including vitamins, herbs, eye drops, creams, and over-the-counter medicines. Any problems you or family members have had with anesthetic medicines. Any blood disorders you have. Any surgeries you have had. Any history of smoking. Any medical conditions you have. Whether you are pregnant or  may be pregnant. What are the risks? Generally, this is a safe procedure. However, problems may occur, including: Infection. Too much bleeding. A collection of blood near the incision (hematoma). Damage to blood vessels, tissues, or organs in the abdomen area. Allergic reactions to medicines. Pain or cramping. Slow healing. Catheter problems after the surgery. The catheter may: Become blocked. Move out of place. Poke or wrap around intestines. Allow fluid to leak around it. Scarring. Skin damage. What happens before the procedure? Staying hydrated Follow instructions from your health care provider about hydration, which may include: Up to 2 hours before the procedure - you may continue to drink clear liquids, such as water, clear fruit juice, black coffee, and plain tea.  Eating and drinking restrictions Follow instructions from your health care provider about eating and drinking, which may include: 8 hours before the procedure - stop eating heavy meals or foods, such as meat, fried foods, or fatty foods. 6 hours before the procedure - stop eating light meals or foods, such as toast or cereal. 6 hours before the procedure - stop drinking milk or drinks that contain milk. 2 hours before the procedure - stop drinking clear liquids. Medicines Ask your health care provider about: Changing or stopping your regular medicines. This is especially important if you are taking diabetes medicines or blood thinners. Taking medicines such as aspirin and ibuprofen. These medicines can thin your blood. Do not take these medicines unless your health care provider tells you to take them. Taking over-the-counter medicines, vitamins, herbs, and supplements. Surgery safety Ask your health care provider: How your surgery site will be marked. What  steps will be taken to help prevent infection. These steps may include: Removing hair at the surgery site. Washing skin with a germ-killing soap. Taking  antibiotic medicine. General instructions You may have a CT scan or ultrasound of your abdomen. You may have a blood sample taken. Plan to have a responsible adult take you home from the hospital or clinic. Your health care provider will discuss the best site for the catheter to be placed. The site will be chosen: To help prevent the catheter from being flattened or damaged. To make it as comfortable as possible for you. What happens during the procedure? An IV will be inserted into one of your veins. You will be given one or both of the following: A medicine to help you relax (sedative). A medicine to numb the area (local anesthetic). If you are having an open surgery, one large incision will be made in the abdomen. If you are having laparoscopic surgery, small incisions will be made in the abdomen. A laparoscope and instruments will be put through the incisions. The catheter will be put in place. A short tunnel will be made under the skin to a location where the catheter exits the abdomen. Stitches (sutures) will be placed around the catheter to hold it in place. Your incisions will be closed with sutures or staples. The procedure may vary among health care providers and hospitals. What happens after the procedure? Your blood pressure, heart rate, breathing rate, and blood oxygen level will be monitored until you leave the hospital or clinic. You may have some pain. You will be given pain medicine as needed. You will be given instructions about how to care for your catheter and how it is used for the dialysis process. Summary Peritoneal dialysis catheter placement is a surgery to insert a thin, flexible tube (catheter) in your abdomen. This surgery must be done before you begin peritoneal dialysis. Before the procedure, your health care provider will discuss the best site for the catheter to be placed. The site will be chosen to help prevent the catheter from being flattened or damaged,  and to make it as comfortable as possible for you. After the procedure, you will be given instructions about how to care for your catheter and how it is used for the dialysis process. This information is not intended to replace advice given to you by your health care provider. Make sure you discuss any questions you have with your health care provider. Document Revised: 08/06/2019 Document Reviewed: 08/06/2019 Elsevier Patient Education  [place].

## 2021-07-18 ENCOUNTER — Telehealth: Payer: Self-pay | Admitting: Surgery

## 2021-07-18 ENCOUNTER — Telehealth: Payer: Self-pay

## 2021-07-18 NOTE — Telephone Encounter (Signed)
Faxed cardiac clearance to Dr. Nehemiah Massed at [phone].

## 2021-07-18 NOTE — Telephone Encounter (Signed)
Spoke with wife, [name] and information is provided to her regarding her husband's surgery.    Surgery Date: 08/10/21 Preadmission Testing Date: 08/01/21 (phone 1p-5p)  Patient has been made aware to call [phone], between 1-3:00pm the day before surgery, to find out what time to arrive for surgery.

## 2021-07-19 ENCOUNTER — Other Ambulatory Visit: Payer: Self-pay | Admitting: Family Medicine

## 2021-07-19 DIAGNOSIS — N138 Other obstructive and reflux uropathy: Secondary | ICD-10-CM

## 2021-07-19 MED ORDER — TAMSULOSIN HCL 0.4 MG PO CAPS: 0.4000 mg | ORAL_CAPSULE | Freq: Every day | ORAL | 3 refills | Status: DC

## 2021-07-19 NOTE — Progress Notes (Signed)
Patient ID: Xander Jutras., male   DOB: 08-03-1940, 81 y.o.   MRN: [id number]  HPI Siddh Vandeventer. is a 81 y.o. male seen in consultation at the request of Dr. Lucrezia Europe for peritoneal dialysis placement.  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  He also has a history of A-fib and cardiomyopathy with an ejection fraction of 40%.  Currently he has good cardiovascular reserve and is able to perform more than 4 METS of activity without any shortness of breath or chest pain.  He has had bilateral knee replacements without any major complications. Did have a recent CT scan of the abdomen pelvis that I personally reviewed showing evidence of moderate-sized inguinal hernias.  There is also evidence of a large prostate. CBC shows anemia with a hemoglobin of 9 platelets are normal, CMP shows a creatinine of 3.9 BUN of 15.  BUN of 3.4. Wife is with him.  She had a lot of questions regarding traveling with.  Catheter and also a lot of questions were evaluated J6.  I was able to answer most of them to my best of my abilities.  Also encouraged her to seek some of the answers from the dialysis center. hE Is currently anticoagulated on Eliquis HPI  Past Medical History:  Diagnosis Date   Aortic atherosclerosis (HCC)    Arthritis    Atrial fibrillation and flutter (HCC)    a.) CHA2DS2-VASc = 5 (age x 2, CHF, HTN, aortic plaque). b.) rate/rhythm maintain on oral carvedilol; chronically anticoagulated with dose reduced apixaban   BPH (benign prostatic hyperplasia)    Cardiac syncope    Cardiomyopathy ([place])    a.) TTE 04/23/2016: EF 40%. b.) TTE 09/03/2016: EF 50%. c.) TTE 05/04/2019: EF >55%.   CHF (congestive heart failure) ([place])    a.) TTE 04/23/2016: EF 40%; BAE; mild AR/TR, mod MR; G1DD. b.) TTE 09/03/2016: EF 50%; mild LVH; BAE; triv AR/PR, mod MR/TR. c.) TTE 05/04/2019: EF >55%; mod LVH, mild LA enlargement; mild AR/MR/TR   CKD (chronic kidney disease), stage IV  (HCC)    Complication of anesthesia    a.) h/o intra/postoperative A.fib   Coronary artery disease    Diverticulosis    Gout    History of 2019 novel coronavirus disease (COVID-19) 11/14/2020   History of colon polyps    Hypercholesteremia    Hypertension    Long term current use of anticoagulant    a.) dose reduced apixaban; dose reduction d/t to age and CKD Dx.   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[place])    a.) s/p resection   MGUS (monoclonal gammopathy of unknown significance)    Pleural effusion    Prostatism    Rectal bleeding     Past Surgical History:  Procedure Laterality Date   ANKLE SURGERY     following volleyball injury   APPENDECTOMY     COLONOSCOPY     COLONOSCOPY WITH PROPOFOL N/A 01/02/2017   Procedure: COLONOSCOPY WITH PROPOFOL;  Surgeon: Manya Silvas, MD;  Location: [facility] ENDOSCOPY;  Service: Endoscopy;  Laterality: N/A;   DIALYSIS/PERMA CATHETER INSERTION N/A 03/27/2021   Procedure: DIALYSIS/PERMA CATHETER INSERTION;  Surgeon: Algernon Huxley, MD;  Location: [place] CV LAB;  Service: Cardiovascular;  Laterality: N/A;   DIALYSIS/PERMA CATHETER REMOVAL N/A 05/08/2021   Procedure: DIALYSIS/PERMA CATHETER REMOVAL;  Surgeon: Algernon Huxley, MD;  Location: [place] [facility] LAB;  Service: Cardiovascular;  Laterality: N/A;   JOINT REPLACEMENT     RIGHT  TOTAL HIP   KNEE ARTHROSCOPY WITH LATERAL RELEASE Right 04/20/2021   Procedure: Right knee medial retinaculum and lateral release with polyethylene exchange;  Surgeon: Hessie Knows, MD;  Location: [facility] ORS;  Service: Orthopedics;  Laterality: Right;   TONSILLECTOMY     TOTAL HIP ARTHROPLASTY  09/2009   Right femoral neck fracture   TOTAL HIP ARTHROPLASTY Left 03/06/2016   Procedure: TOTAL HIP ARTHROPLASTY ANTERIOR APPROACH;  Surgeon: Hessie Knows, MD;  Location: [facility] ORS;  Service: Orthopedics;  Laterality: Left;   TOTAL KNEE ARTHROPLASTY Right 03/21/2021   Procedure: TOTAL KNEE ARTHROPLASTY;  Surgeon: Hessie Knows, MD;  Location: [facility]  ORS;  Service: Orthopedics;  Laterality: Right;    Family History  Problem Relation Age of Onset   Cirrhosis Mother    Other Father        Lung Fibrosis   Prostate cancer Neg Hx    Kidney cancer Neg Hx    Bladder Cancer Neg Hx     Social History Social History   Tobacco Use   Smoking status: Former    Years: 15.00    Types: Cigarettes    Quit date: 01/01/1981    Years since quitting: 40.5   Smokeless tobacco: Never  Vaping Use   Vaping Use: Never used  Substance Use Topics   Alcohol use: Not Currently    Alcohol/week: 2.0 standard drinks of alcohol    Types: 2 Cans of beer per week   Drug use: No    No Known Allergies  Current Outpatient Medications  Medication Sig Dispense Refill   amLODipine (NORVASC) 5 MG tablet Take 1 tablet by mouth daily.     apixaban (ELIQUIS) 2.5 MG TABS tablet Take 2.5 mg by mouth 2 (two) times daily.     Calcium Carbonate-Vitamin D 600-5 MG-MCG TABS Take by mouth.     carvedilol (COREG) 3.125 MG tablet Take 3.125 mg by mouth 2 (two) times daily with a meal.      cholecalciferol (VITAMIN D3) 25 MCG (1000 UNIT) tablet Take 1,000 Units by mouth daily.     hydrocortisone cream 1 % Apply 1 application. topically 2 (two) times daily as needed for itching.     Multiple Vitamin (MULTIVITAMIN WITH MINERALS) TABS tablet Take 1 tablet by mouth daily.     rosuvastatin (CRESTOR) 10 MG tablet Take 10 mg by mouth at bedtime.     torsemide (DEMADEX) 20 MG tablet Take 2 tablets (40 mg total) by mouth daily. 60 tablet 0   tamsulosin (FLOMAX) 0.4 MG CAPS capsule Take 1 capsule (0.4 mg total) by mouth daily. 90 capsule 3   No current facility-administered medications for this visit.     Review of Systems Full ROS  was asked and was negative except for the information on the HPI  Physical Exam Blood pressure 131/72, pulse 67, temperature 98.7 F (37.1 C), temperature source Oral, height '5\' 10"'$  (1.778 m), weight 179 lb 12.8 oz (81.6 kg), SpO2 98  %. CONSTITUTIONAL: NAD. EYES: Pupils are equal, round, Sclera are non-icteric. EARS, NOSE, MOUTH AND THROAT:  The oral mucosa is pink and moist. Hearing is intact to voice. LYMPH NODES:  Lymph nodes in the neck are normal. RESPIRATORY:  Lungs are clear. There is normal respiratory effort, with equal breath sounds bilaterally, and without pathologic use of accessory muscles. CARDIOVASCULAR: Heart is regular without murmurs, gallops, or rubs. GI: The abdomen is  soft, nontender, and nondistended. There are no palpable masses. There is no hepatosplenomegaly. There are normal  bowel sounds .  There is evidence of moderate-sized inguinal hernias bilaterally that are reducible GU: Rectal deferred.   MUSCULOSKELETAL: Normal muscle strength and tone. No cyanosis or edema.   SKIN: Turgor is good and there are no pathologic skin lesions or ulcers. NEUROLOGIC: Motor and sensation is grossly normal. Cranial nerves are grossly intact. PSYCH:  Oriented to person, place and time. Affect is normal.  Data Reviewed  I have personally reviewed the patient's imaging, laboratory findings and medical records.    Assessment/Plan 81 year old male with end-stage renal disease approaching dialysis in addition to bilateral moderate inguinal hernias.  An extensive discussion with patient and the wife regarding his disease process.  I do think that he will be a good candidate for laparoscopic PD catheter placement but also we will need to address the significant inguinal hernias to make sure that the catheter works appropriately.  I do think that we will be able to fix the hernias robotically. Procedure discussed with the patient and the wife in detail.  Risks, benefits and possible medications including but not limited to: Bleeding, infection, hernia recurrences, mesh issues, catheter malfunction and chronic pain.  They understand and wish to proceed. I Spent greater than 60 minutes in this encounter in occluding personally  reviewing imaging studies, coordinating his care, counseling the patient, placing orders and performing appropriate augmentation  Caroleen Hamman, MD [place] Surgeon 07/19/2021, 3:29 PM

## 2021-07-19 NOTE — H&P (View-Only) (Signed)
Patient ID: Lawrence Perkins., male   DOB: 25-Sep-1940, 81 y.o.   MRN: [id number]  HPI Lawrence Perkins. is a 81 y.o. male seen in consultation at the request of Dr. Lucrezia Europe for peritoneal dialysis placement.  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  He also has a history of A-fib and cardiomyopathy with an ejection fraction of 40%.  Currently he has good cardiovascular reserve and is able to perform more than 4 METS of activity without any shortness of breath or chest pain.  He has had bilateral knee replacements without any major complications. Did have a recent CT scan of the abdomen pelvis that I personally reviewed showing evidence of moderate-sized inguinal hernias.  There is also evidence of a large prostate. CBC shows anemia with a hemoglobin of 9 platelets are normal, CMP shows a creatinine of 3.9 BUN of 15.  BUN of 3.4. Wife is with him.  She had a lot of questions regarding traveling with.  Catheter and also a lot of questions were evaluated J6.  I was able to answer most of them to my best of my abilities.  Also encouraged her to seek some of the answers from the dialysis center. hE Is currently anticoagulated on Eliquis HPI  Past Medical History:  Diagnosis Date   Aortic atherosclerosis (HCC)    Arthritis    Atrial fibrillation and flutter (HCC)    a.) CHA2DS2-VASc = 5 (age x 2, CHF, HTN, aortic plaque). b.) rate/rhythm maintain on oral carvedilol; chronically anticoagulated with dose reduced apixaban   BPH (benign prostatic hyperplasia)    Cardiac syncope    Cardiomyopathy ([place])    a.) TTE 04/23/2016: EF 40%. b.) TTE 09/03/2016: EF 50%. c.) TTE 05/04/2019: EF >55%.   CHF (congestive heart failure) ([place])    a.) TTE 04/23/2016: EF 40%; BAE; mild AR/TR, mod MR; G1DD. b.) TTE 09/03/2016: EF 50%; mild LVH; BAE; triv AR/PR, mod MR/TR. c.) TTE 05/04/2019: EF >55%; mod LVH, mild LA enlargement; mild AR/MR/TR   CKD (chronic kidney disease), stage IV  (HCC)    Complication of anesthesia    a.) h/o intra/postoperative A.fib   Coronary artery disease    Diverticulosis    Gout    History of 2019 novel coronavirus disease (COVID-19) 11/14/2020   History of colon polyps    Hypercholesteremia    Hypertension    Long term current use of anticoagulant    a.) dose reduced apixaban; dose reduction d/t to age and CKD Dx.   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[place])    a.) s/p resection   MGUS (monoclonal gammopathy of unknown significance)    Pleural effusion    Prostatism    Rectal bleeding     Past Surgical History:  Procedure Laterality Date   ANKLE SURGERY     following volleyball injury   APPENDECTOMY     COLONOSCOPY     COLONOSCOPY WITH PROPOFOL N/A 01/02/2017   Procedure: COLONOSCOPY WITH PROPOFOL;  Surgeon: Manya Silvas, MD;  Location: [facility] ENDOSCOPY;  Service: Endoscopy;  Laterality: N/A;   DIALYSIS/PERMA CATHETER INSERTION N/A 03/27/2021   Procedure: DIALYSIS/PERMA CATHETER INSERTION;  Surgeon: Algernon Huxley, MD;  Location: [place] CV LAB;  Service: Cardiovascular;  Laterality: N/A;   DIALYSIS/PERMA CATHETER REMOVAL N/A 05/08/2021   Procedure: DIALYSIS/PERMA CATHETER REMOVAL;  Surgeon: Algernon Huxley, MD;  Location: [place] [facility] LAB;  Service: Cardiovascular;  Laterality: N/A;   JOINT REPLACEMENT     RIGHT  TOTAL HIP   KNEE ARTHROSCOPY WITH LATERAL RELEASE Right 04/20/2021   Procedure: Right knee medial retinaculum and lateral release with polyethylene exchange;  Surgeon: Hessie Knows, MD;  Location: [facility] ORS;  Service: Orthopedics;  Laterality: Right;   TONSILLECTOMY     TOTAL HIP ARTHROPLASTY  09/2009   Right femoral neck fracture   TOTAL HIP ARTHROPLASTY Left 03/06/2016   Procedure: TOTAL HIP ARTHROPLASTY ANTERIOR APPROACH;  Surgeon: Hessie Knows, MD;  Location: [facility] ORS;  Service: Orthopedics;  Laterality: Left;   TOTAL KNEE ARTHROPLASTY Right 03/21/2021   Procedure: TOTAL KNEE ARTHROPLASTY;  Surgeon: Hessie Knows, MD;  Location: [facility]  ORS;  Service: Orthopedics;  Laterality: Right;    Family History  Problem Relation Age of Onset   Cirrhosis Mother    Other Father        Lung Fibrosis   Prostate cancer Neg Hx    Kidney cancer Neg Hx    Bladder Cancer Neg Hx     Social History Social History   Tobacco Use   Smoking status: Former    Years: 15.00    Types: Cigarettes    Quit date: 01/01/1981    Years since quitting: 40.5   Smokeless tobacco: Never  Vaping Use   Vaping Use: Never used  Substance Use Topics   Alcohol use: Not Currently    Alcohol/week: 2.0 standard drinks of alcohol    Types: 2 Cans of beer per week   Drug use: No    No Known Allergies  Current Outpatient Medications  Medication Sig Dispense Refill   amLODipine (NORVASC) 5 MG tablet Take 1 tablet by mouth daily.     apixaban (ELIQUIS) 2.5 MG TABS tablet Take 2.5 mg by mouth 2 (two) times daily.     Calcium Carbonate-Vitamin D 600-5 MG-MCG TABS Take by mouth.     carvedilol (COREG) 3.125 MG tablet Take 3.125 mg by mouth 2 (two) times daily with a meal.      cholecalciferol (VITAMIN D3) 25 MCG (1000 UNIT) tablet Take 1,000 Units by mouth daily.     hydrocortisone cream 1 % Apply 1 application. topically 2 (two) times daily as needed for itching.     Multiple Vitamin (MULTIVITAMIN WITH MINERALS) TABS tablet Take 1 tablet by mouth daily.     rosuvastatin (CRESTOR) 10 MG tablet Take 10 mg by mouth at bedtime.     torsemide (DEMADEX) 20 MG tablet Take 2 tablets (40 mg total) by mouth daily. 60 tablet 0   tamsulosin (FLOMAX) 0.4 MG CAPS capsule Take 1 capsule (0.4 mg total) by mouth daily. 90 capsule 3   No current facility-administered medications for this visit.     Review of Systems Full ROS  was asked and was negative except for the information on the HPI  Physical Exam Blood pressure 131/72, pulse 67, temperature 98.7 F (37.1 C), temperature source Oral, height '5\' 10"'$  (1.778 m), weight 179 lb 12.8 oz (81.6 kg), SpO2 98  %. CONSTITUTIONAL: NAD. EYES: Pupils are equal, round, Sclera are non-icteric. EARS, NOSE, MOUTH AND THROAT:  The oral mucosa is pink and moist. Hearing is intact to voice. LYMPH NODES:  Lymph nodes in the neck are normal. RESPIRATORY:  Lungs are clear. There is normal respiratory effort, with equal breath sounds bilaterally, and without pathologic use of accessory muscles. CARDIOVASCULAR: Heart is regular without murmurs, gallops, or rubs. GI: The abdomen is  soft, nontender, and nondistended. There are no palpable masses. There is no hepatosplenomegaly. There are normal  bowel sounds .  There is evidence of moderate-sized inguinal hernias bilaterally that are reducible GU: Rectal deferred.   MUSCULOSKELETAL: Normal muscle strength and tone. No cyanosis or edema.   SKIN: Turgor is good and there are no pathologic skin lesions or ulcers. NEUROLOGIC: Motor and sensation is grossly normal. Cranial nerves are grossly intact. PSYCH:  Oriented to person, place and time. Affect is normal.  Data Reviewed  I have personally reviewed the patient's imaging, laboratory findings and medical records.    Assessment/Plan 81 year old male with end-stage renal disease approaching dialysis in addition to bilateral moderate inguinal hernias.  An extensive discussion with patient and the wife regarding his disease process.  I do think that he will be a good candidate for laparoscopic PD catheter placement but also we will need to address the significant inguinal hernias to make sure that the catheter works appropriately.  I do think that we will be able to fix the hernias robotically. Procedure discussed with the patient and the wife in detail.  Risks, benefits and possible medications including but not limited to: Bleeding, infection, hernia recurrences, mesh issues, catheter malfunction and chronic pain.  They understand and wish to proceed. I Spent greater than 60 minutes in this encounter in occluding personally  reviewing imaging studies, coordinating his care, counseling the patient, placing orders and performing appropriate augmentation  Caroleen Hamman, MD [place] Surgeon 07/19/2021, 3:29 PM

## 2021-07-20 ENCOUNTER — Telehealth: Payer: Self-pay

## 2021-07-20 NOTE — Progress Notes (Unsigned)
Cardiology clearance has been received from Dr Nehemiah Massed. The patient is cleared at Low risk for surgery.

## 2021-07-20 NOTE — Telephone Encounter (Signed)
Patient notified that he will stop his Eliquis 48 hours prior to surgery. His last dose will be on 08/07/21. He is aware.

## 2021-07-26 DIAGNOSIS — N185 Chronic kidney disease, stage 5: Secondary | ICD-10-CM | POA: Diagnosis not present

## 2021-08-01 ENCOUNTER — Encounter
Admission: RE | Admit: 2021-08-01 | Discharge: 2021-08-01 | Disposition: A | Discharge status: Home / Self Care | Payer: Medicare HMO | Source: Ambulatory Visit | Attending: Surgery | Admitting: Surgery

## 2021-08-01 VITALS — Ht 70.0 in | Wt 180.0 lb

## 2021-08-01 DIAGNOSIS — I5022 Chronic systolic (congestive) heart failure: Secondary | ICD-10-CM

## 2021-08-01 DIAGNOSIS — N184 Chronic kidney disease, stage 4 (severe): Secondary | ICD-10-CM

## 2021-08-01 DIAGNOSIS — Z01812 Encounter for preprocedural laboratory examination: Secondary | ICD-10-CM

## 2021-08-01 NOTE — Patient Instructions (Addendum)
Your procedure is scheduled on: Thursday August 10, 2021. Report to Day Surgery inside [facility] 2nd floor, stop by admissions desk before getting on elevator.  To find out your arrival time please call [phone] between 1PM - 3PM on Wednesday August 09, 2021.  Remember: Instructions that are not followed completely may result in serious medical risk,  up to and including death, or upon the discretion of your surgeon and anesthesiologist your  surgery may need to be rescheduled.     _X__ 1. Do not eat food after midnight the night before your procedure.                 No chewing gum or hard candies. You may drink clear liquids up to 2 hours                 before you are scheduled to arrive for your surgery- DO not drink clear                 liquids within 2 hours of the start of your surgery.                 Clear Liquids include:  water, apple juice without pulp, clear Gatorade, G2 or                  Gatorade Zero (avoid Red/Purple/Blue), Black Coffee or Tea (Do not add                 anything to coffee or tea).  __X__2.  On the morning of surgery brush your teeth with toothpaste and water, you                may rinse your mouth with mouthwash if you wish.  Do not swallow any toothpaste or mouthwash.     _X__ 3.  No Alcohol for 24 hours before or after surgery.   _X__ 4.  Do Not Smoke or use e-cigarettes For 24 Hours Prior to Your Surgery.                 Do not use any chewable tobacco products for at least 6 hours prior to                 Surgery.  _X__  5.  Do not use any recreational drugs (marijuana, cocaine, heroin, ecstasy, MDMA or other)                For at least one week prior to your surgery.  Combination of these drugs with anesthesia                May have life threatening results.  ____  6.  Bring all medications with you on the day of surgery if instructed.   __X__  7.  Notify your doctor if there is any change in your medical  condition      (cold, fever, infections).     Do not wear jewelry, make-up, hairpins, clips or nail polish. Do not wear lotions, powders, or perfumes. You may wear deodorant. Do not shave 48 hours prior to surgery. Men may shave face and neck. Do not bring valuables to the hospital.    [facility] is not responsible for any belongings or valuables.  Contacts, dentures or bridgework may not be worn into surgery. Leave your suitcase in the car. After surgery it may be brought to your room. For patients admitted to the hospital, discharge time is  determined by your treatment team.   Patients discharged the day of surgery will not be allowed to drive home.   Make arrangements for someone to be with you for the first 24 hours of your Same Day Discharge.   __X__ Take these medicines the morning of surgery with A SIP OF WATER:    1. amLODipine (NORVASC) 5 MG tablet  2. carvedilol (COREG) 3.125 MG tablet  3. tamsulosin (FLOMAX) 0.4 MG CAPS   4.  5.  6.  ____ Fleet Enema (as directed)   __X__ Use CHG Soap (or wipes) as directed  ____ Use Benzoyl Peroxide Gel as instructed  ____ Use inhalers on the day of surgery  ____ Stop metformin 2 days prior to surgery    ____ Take 1/2 of usual insulin dose the night before surgery. No insulin the morning          of surgery.   __X__ Stop apixaban (ELIQUIS) 2.5 MG TABS tablet 2 days prior to surgery (take last dose 08/07/21).   __X__ One Week prior to surgery- Stop Anti-inflammatories such as Ibuprofen, Aleve, Advil, Motrin, meloxicam (MOBIC), diclofenac, etodolac, ketorolac, Toradol, Daypro, piroxicam, Goody's or BC powders. OK TO USE TYLENOL IF NEEDED   __X__ Stop supplements until after surgery.    ____ Bring C-Pap to the hospital.    If you have any questions regarding your pre-procedure instructions,  Please call Pre-admit Testing at [phone]

## 2021-08-02 DIAGNOSIS — D631 Anemia in chronic kidney disease: Secondary | ICD-10-CM | POA: Diagnosis not present

## 2021-08-02 DIAGNOSIS — I10 Essential (primary) hypertension: Secondary | ICD-10-CM | POA: Diagnosis not present

## 2021-08-02 DIAGNOSIS — N185 Chronic kidney disease, stage 5: Secondary | ICD-10-CM | POA: Diagnosis not present

## 2021-08-02 DIAGNOSIS — N2581 Secondary hyperparathyroidism of renal origin: Secondary | ICD-10-CM | POA: Diagnosis not present

## 2021-08-04 ENCOUNTER — Encounter: Payer: Self-pay | Admitting: Surgery

## 2021-08-04 NOTE — Progress Notes (Addendum)
Perioperative Services  Pre-Admission/Anesthesia Testing Clinical Review  Date: 08/04/21  Patient Demographics:  Name: Lawrence Perkins. DOB:   November 05, 1940 MRN:   [id number]  Planned Surgical Procedure(s):    Case: [id number] Date/Time: 08/10/21 1056   Procedures:  LAPAROSCOPIC INSERTION CONTINUOUS AMBULATORY PERITONEAL DIALYSIS  (CAPD) CATHETER XI ROBOTIC ASSISTED INGUINAL HERNIA (Bilateral)   Anesthesia type: General   Pre-op diagnosis: ESRD, inguinal hernia   Location: [facility] OR ROOM 04 / [facility]   Surgeons: Jules Husbands, MD   NOTE: Available PAT nursing documentation and vital signs have been reviewed. Clinical nursing staff has updated patient's PMH/PSHx, current medication list, and drug allergies/intolerances to ensure comprehensive history available to assist in medical decision making as it pertains to the aforementioned surgical procedure and anticipated anesthetic course. Extensive review of available clinical information performed. Lawrence Perkins PMH and PSHx updated with any diagnoses/procedures that  may have been inadvertently omitted during his intake with the pre-admission testing department's nursing staff.  Clinical Discussion:  Lawrence Perkins. is a 81 y.o. male who is submitted for pre-surgical anesthesia review and clearance prior to him undergoing the above procedure. Patient is a Former Smoker (quit 01/1981). Pertinent PMH includes: CAD, cardiomyopathy, CHF, atrial fibrillation, aortic atherosclerosis, cardiac syncope, CKD-V, HTN, HLD, OA, BPH, BILATERAL inguinal hernias.   Patient is followed by cardiology Nehemiah Massed, MD). He was last seen in the cardiology clinic on 06/29/2021.  At the time of his last clinic visit, patient doing well overall from a cardiovascular perspective.  He denied any episodes of chest pain, shortness breath, PND, orthopnea, palpitations, significant peripheral edema, vertiginous symptoms, or presyncope/syncope.  Patient  has a past medical history significant for cardiovascular diagnoses.  Last TTE was performed on 05/04/2019 revealing a normal left ventricular systolic function with moderate LVH.  LVEF >55%.  Left atrium was mildly enlarged.  There was mild aortic, mitral, and tricuspid valve regurgitation.  There was no significant transvalvular gradient to suggest stenosis.  Myocardial perfusion imaging study performed on 05/05/2019 revealing normal left ventricular systolic function with an EF of 59%.  There was no evidence of stress-induced myocardial ischemia or arrhythmia; no scintigraphic evidence of scar.  Study determined to be normal and low risk.  Patient with an atrial fibrillation diagnosis; CHA2DS2-VASc Score = 5 (age x 2, CHF, HTN, aortic plaque).  Rate and rhythm maintained on oral carvedilol.  Patient is chronically anticoagulated on dose reduced apixaban (d/t age and renal function). He is reported to be compliant with prescribed anticoagulation therapy with no evidence or reports of GI bleeding. Blood pressure elevated at 160/90 on currently prescribed CCB (amlodipine), beta-blocker (carvedilol), and diuretic (torsemide) therapies.  Patient is on rosuvastatin for his HLD diagnosis and ASCVD prevention.  He is not diabetic.  Functional capacity somewhat limited by patient's arthritides, however he is still felt to be able to achieve at least 4 METS of activity without angina/anginal equivalent symptoms.  No changes were made to his medication regimen.  Patient follow-up with outpatient cardiology in 6 months or sooner if needed.  Lawrence Perkins. is scheduled for LAPAROSCOPIC CONTINUOUS AMBULATORY PERITONEAL DIALYSIS CATHETER INSERTION; XI ROBOTIC ASSISTED BILATERAL INGUINAL HERNIA REPAIR on 08/10/2021 with Caroleen Hamman, MD. Given patient's past medical history significant for cardiovascular diagnoses, presurgical cardiac clearance was sought by the PAT team. Per cardiology, "this patient is optimized  for surgery and may proceed with the planned procedural course with a LOW risk of significant perioperative cardiovascular complications". Again,  this patient is on daily anticoagulation therapy.  He has been instructed on recommendations from his cardiologist for holding his apixaban therapy for 2 days prior to his procedure with plans to restart as soon as postoperative bleeding risk felt to be minimized by his primary attending surgeon.  The patient is aware that his last labs of apixaban should be on 08/07/2021.  Patient denies previous perioperative complications with anesthesia in the past.  Patient reporting (+) intra/postoperative exacerbations of his known atrial fibrillation. In review of the available records, it is noted that patient underwent a general anesthetic course here (ASA III) in 04/2021 without documented complications.      08/01/2021    1:10 PM 07/17/2021    3:22 PM 06/06/2021   11:18 AM  Vitals with BMI  Height '5\' 10"'$  '5\' 10"'$  '5\' 9"'$   Weight 180 lbs 179 lbs 13 oz 195 lbs  BMI 25.83 84.6 96.29  Systolic  528 413  Diastolic  72 72  Pulse  67 55    Providers/Specialists:   NOTE: Primary physician provider listed below. Patient may have been seen by APP or partner within same practice.   PROVIDER ROLE / SPECIALTY LAST Suszanne Finch, MD General Surgery (Surgeon) 07/17/2021  Kirk Ruths, MD Primary Care Provider 02/28/2021  Serafina Royals, MD Cardiology 06/25/2021  Delight Hoh, MD Hematology/Oncology 05/09/2021  Anthonette Legato, MD Nephrology 08/02/2021   Allergies:  Patient has no known allergies.  Current Home Medications:   No current facility-administered medications for this encounter.    amLODipine (NORVASC) 5 MG tablet   apixaban (ELIQUIS) 2.5 MG TABS tablet   B Complex Vitamins (B-COMPLEX/B-12 PO)   Calcium Carbonate-Vitamin D 600-5 MG-MCG TABS   carvedilol (COREG) 3.125 MG tablet   cholecalciferol (VITAMIN D3) 25 MCG (1000 UNIT) tablet    hydrocortisone cream 1 %   Multiple Vitamin (MULTIVITAMIN WITH MINERALS) TABS tablet   rosuvastatin (CRESTOR) 10 MG tablet   tamsulosin (FLOMAX) 0.4 MG CAPS capsule   torsemide (DEMADEX) 20 MG tablet   History:   Past Medical History:  Diagnosis Date   Aortic atherosclerosis (HCC)    Arthritis    Atrial fibrillation and flutter (HCC)    a.) CHA2DS2-VASc = 5 (age x 2, CHF, HTN, aortic plaque). b.) rate/rhythm maintain on oral carvedilol; chronically anticoagulated with dose reduced apixaban   Bilateral inguinal hernia    BPH (benign prostatic hyperplasia)    Cardiac syncope    Cardiomyopathy ([place])    a.) TTE 04/23/2016: EF 40%. b.) TTE 09/03/2016: EF 50%. c.) TTE 05/04/2019: EF >55%.   CHF (congestive heart failure) ([place])    a.) TTE 04/23/2016: EF 40%; BAE; mild AR/TR, mod MR; G1DD. b.) TTE 09/03/2016: EF 50%; mild LVH; BAE; triv AR/PR, mod MR/TR. c.) TTE 05/04/2019: EF >55%; mod LVH, mild LA enlargement; mild AR/MR/TR   CKD (chronic kidney disease), stage V (HCC)    Complication of anesthesia    a.) h/o intra/postoperative A.fib   Coronary artery disease    Diverticulosis    Gout    History of 2019 novel coronavirus disease (COVID-19) 11/14/2020   History of colon polyps    Hypercholesteremia    Hypertension    Long term current use of anticoagulant    a.) dose reduced apixaban; dose reduction d/t to age and CKD Dx.   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[facility])    a.) s/p resection   MGUS (monoclonal gammopathy of unknown significance)    Pleural effusion  Rectal bleeding    Secondary hyperparathyroidism of renal origin [facility])    Past Surgical History:  Procedure Laterality Date   ANKLE SURGERY Right 1985   following volleyball injury   APPENDECTOMY     at the of 9 yrs   COLONOSCOPY     COLONOSCOPY WITH PROPOFOL N/A 01/02/2017   Procedure: COLONOSCOPY WITH PROPOFOL;  Surgeon: Manya Silvas, MD;  Location: [facility] ENDOSCOPY;  Service: Endoscopy;  Laterality: N/A;   DIALYSIS/PERMA  CATHETER INSERTION N/A 03/27/2021   Procedure: DIALYSIS/PERMA CATHETER INSERTION;  Surgeon: Algernon Huxley, MD;  Location: [place] CV LAB;  Service: Cardiovascular;  Laterality: N/A;   DIALYSIS/PERMA CATHETER REMOVAL N/A 05/08/2021   Procedure: DIALYSIS/PERMA CATHETER REMOVAL;  Surgeon: Algernon Huxley, MD;  Location: [place] CV LAB;  Service: Cardiovascular;  Laterality: N/A;   JOINT REPLACEMENT     RIGHT TOTAL HIP   KNEE ARTHROSCOPY WITH LATERAL RELEASE Right 04/20/2021   Procedure: Right knee medial retinaculum and lateral release with polyethylene exchange;  Surgeon: Hessie Knows, MD;  Location: [facility] ORS;  Service: Orthopedics;  Laterality: Right;   TONSILLECTOMY     as a child   TOTAL HIP ARTHROPLASTY  09/2009   Right femoral neck fracture   TOTAL HIP ARTHROPLASTY Left 03/06/2016   Procedure: TOTAL HIP ARTHROPLASTY ANTERIOR APPROACH;  Surgeon: Hessie Knows, MD;  Location: [facility] ORS;  Service: Orthopedics;  Laterality: Left;   TOTAL KNEE ARTHROPLASTY Right 03/21/2021   Procedure: TOTAL KNEE ARTHROPLASTY;  Surgeon: Hessie Knows, MD;  Location: [facility] ORS;  Service: Orthopedics;  Laterality: Right;   Family History  Problem Relation Age of Onset   Cirrhosis Mother    Other Father        Lung Fibrosis   Prostate cancer Neg Hx    Kidney cancer Neg Hx    Bladder Cancer Neg Hx    Social History   Tobacco Use   Smoking status: Former    Years: 15.00    Types: Cigarettes    Quit date: 01/01/1981    Years since quitting: 40.6   Smokeless tobacco: Never  Vaping Use   Vaping Use: Never used  Substance Use Topics   Alcohol use: Not Currently   Drug use: No    Pertinent Clinical Results:  LABS: Labs reviewed: Acceptable for surgery. Lab Results  Component Value Date   WBC 6.5 05/09/2021   HGB 9.8 (L) 05/09/2021   HCT 30.0 (L) 05/09/2021   MCV 101.4 (H) 05/09/2021   PLT 213 05/09/2021   Lab Results  Component Value Date   NA 135 05/09/2021   K 3.6 05/09/2021   CO2 25  05/09/2021   GLUCOSE 104 (H) 05/09/2021   BUN 50 (H) 05/09/2021   CREATININE 3.93 (H) 05/09/2021   CALCIUM 8.6 (L) 05/09/2021   GFRNONAA 15 (L) 05/09/2021    ECG: Date: 03/09/2021 Time ECG obtained: 1025 AM Rate: 43 bpm Rhythm:  Atrial flutter with variable AV block Intervals: QRS 86 ms. QTc 419 ms. ST segment and T wave changes: Inferior nonspecific T wave abnormality.  TWIs in lateral leads.  Comparison: Reviewed with primary attending cardiologist Nehemiah Massed, MD).  Tracing compared to last performed in his office.  No significant changes.  Per Dr. Nehemiah Massed, patient okay to proceed with surgery as planned.  IMAGING / PROCEDURES:  MYOCARDIAL PERFUSION IMAGING STUDY (LEXISCAN) performed on 05/05/2019 LVEF 59% Normal myocardial thickening and wall motion No artifact Left ventricular cavity size normal No evidence of stress-induced myocardial ischemia or arrhythmia  TRANSTHORACIC ECHOCARDIOGRAM performed on 05/04/2019 LVEF 55% Normal left ventricular systolic function with moderate LVH Normal right ventricular systolic function Mild AR, MR, TR No PR No evidence of valvular stenosis No pericardial effusion  Impression and Plan:  Saiquan Hands. has been referred for pre-anesthesia review and clearance prior to him undergoing the planned anesthetic and procedural courses. Available labs, pertinent testing, and imaging results were personally reviewed by me. This patient has been appropriately cleared by cardiology with an overall LOW risk of significant perioperative cardiovascular complications.  Based on clinical review performed today (08/04/21), barring any significant acute changes in the patient's overall condition, it is anticipated that he will be able to proceed with the planned surgical intervention. Any acute changes in clinical condition may necessitate his procedure being postponed and/or cancelled. Patient will meet with anesthesia team (MD and/or CRNA) on the day  of his procedure for preoperative evaluation/assessment. Questions regarding anesthetic course will be fielded at that time.   Pre-surgical instructions were reviewed with the patient during his PAT appointment and questions were fielded by PAT clinical staff. Patient was advised that if any questions or concerns arise prior to his procedure then he should return a call to PAT and/or his surgeon's office to discuss.  Honor Loh, MSN, APRN, FNP-C, CEN [facility]  Peri-operative Services Nurse Practitioner Phone: [phone] Fax: [phone] 08/04/21 10:05 AM  NOTE: This note has been prepared using Dragon dictation software. Despite my best ability to proofread, there is always the potential that unintentional transcriptional errors may still occur from this process.

## 2021-08-09 ENCOUNTER — Other Ambulatory Visit: Payer: Self-pay

## 2021-08-09 ENCOUNTER — Inpatient Hospital Stay: Payer: Medicare HMO | Attending: Oncology

## 2021-08-09 DIAGNOSIS — D472 Monoclonal gammopathy: Secondary | ICD-10-CM | POA: Insufficient documentation

## 2021-08-09 DIAGNOSIS — Z87891 Personal history of nicotine dependence: Secondary | ICD-10-CM | POA: Insufficient documentation

## 2021-08-09 DIAGNOSIS — I132 Hypertensive heart and chronic kidney disease with heart failure and with stage 5 chronic kidney disease, or end stage renal disease: Secondary | ICD-10-CM | POA: Diagnosis not present

## 2021-08-09 DIAGNOSIS — D649 Anemia, unspecified: Secondary | ICD-10-CM | POA: Insufficient documentation

## 2021-08-09 DIAGNOSIS — I509 Heart failure, unspecified: Secondary | ICD-10-CM | POA: Insufficient documentation

## 2021-08-09 DIAGNOSIS — N186 End stage renal disease: Secondary | ICD-10-CM | POA: Diagnosis not present

## 2021-08-09 LAB — COMPREHENSIVE METABOLIC PANEL
ALT: 11 U/L (ref 0–44)
AST: 10 U/L — ABNORMAL LOW (ref 15–41)
Albumin: 3.8 g/dL (ref 3.5–5.0)
Alkaline Phosphatase: 68 U/L (ref 38–126)
Anion gap: 12 (ref 5–15)
BUN: 94 mg/dL — ABNORMAL HIGH (ref 8–23)
CO2: 23 mmol/L (ref 22–32)
Calcium: 9.4 mg/dL (ref 8.9–10.3)
Chloride: 102 mmol/L (ref 98–111)
Creatinine, Ser: 4.53 mg/dL — ABNORMAL HIGH (ref 0.61–1.24)
GFR, Estimated: 12 mL/min — ABNORMAL LOW (ref >60)
Glucose, Bld: 91 mg/dL (ref 70–99)
Potassium: 4.6 mmol/L (ref 3.5–5.1)
Sodium: 137 mmol/L (ref 135–145)
Total Bilirubin: 0.4 mg/dL (ref 0.3–1.2)
Total Protein: 6.8 g/dL (ref 6.5–8.1)

## 2021-08-09 LAB — CBC WITH DIFFERENTIAL/PLATELET
Abs Immature Granulocytes: 0.02 10*3/uL (ref 0.00–0.07)
Basophils Absolute: 0 10*3/uL (ref 0.0–0.1)
Basophils Relative: 0 %
Eosinophils Absolute: 0.1 10*3/uL (ref 0.0–0.5)
Eosinophils Relative: 2 %
HCT: 32.2 % — ABNORMAL LOW (ref 39.0–52.0)
Hemoglobin: 10.9 g/dL — ABNORMAL LOW (ref 13.0–17.0)
Immature Granulocytes: 0 %
Lymphocytes Relative: 12 %
Lymphs Abs: 0.8 10*3/uL (ref 0.7–4.0)
MCH: 33.3 pg (ref 26.0–34.0)
MCHC: 33.9 g/dL (ref 30.0–36.0)
MCV: 98.5 fL (ref 80.0–100.0)
Monocytes Absolute: 0.4 10*3/uL (ref 0.1–1.0)
Monocytes Relative: 6 %
Neutro Abs: 5.4 10*3/uL (ref 1.7–7.7)
Neutrophils Relative %: 80 %
Platelets: 228 10*3/uL (ref 150–400)
RBC: 3.27 MIL/uL — ABNORMAL LOW (ref 4.22–5.81)
RDW: 12.6 % (ref 11.5–15.5)
WBC: 6.8 10*3/uL (ref 4.0–10.5)
nRBC: 0 % (ref 0.0–0.2)

## 2021-08-09 LAB — IRON AND TIBC
Iron: 95 ug/dL (ref 45–182)
Saturation Ratios: 31 % (ref 17.9–39.5)
TIBC: 311 ug/dL (ref 250–450)
UIBC: 216 ug/dL

## 2021-08-09 LAB — FERRITIN: Ferritin: 119 ng/mL (ref 24–336)

## 2021-08-09 LAB — VITAMIN B12: Vitamin B-12: 432 pg/mL (ref 180–914)

## 2021-08-10 ENCOUNTER — Other Ambulatory Visit: Payer: Self-pay

## 2021-08-10 ENCOUNTER — Encounter: Payer: Self-pay | Admitting: Surgery

## 2021-08-10 ENCOUNTER — Ambulatory Visit: Payer: Medicare HMO | Admitting: Urgent Care

## 2021-08-10 ENCOUNTER — Ambulatory Visit
Admission: AD | Admit: 2021-08-10 | Discharge: 2021-08-10 | Disposition: A | Discharge status: Home / Self Care | Payer: Medicare HMO | Source: Ambulatory Visit | Attending: Surgery | Admitting: Surgery

## 2021-08-10 ENCOUNTER — Encounter
Admission: AD | Disposition: A | Discharge status: Home / Self Care | Payer: Self-pay | Source: Ambulatory Visit | Attending: Surgery

## 2021-08-10 DIAGNOSIS — Z96653 Presence of artificial knee joint, bilateral: Secondary | ICD-10-CM | POA: Insufficient documentation

## 2021-08-10 DIAGNOSIS — I2510 Atherosclerotic heart disease of native coronary artery without angina pectoris: Secondary | ICD-10-CM | POA: Insufficient documentation

## 2021-08-10 DIAGNOSIS — D631 Anemia in chronic kidney disease: Secondary | ICD-10-CM

## 2021-08-10 DIAGNOSIS — I132 Hypertensive heart and chronic kidney disease with heart failure and with stage 5 chronic kidney disease, or end stage renal disease: Secondary | ICD-10-CM | POA: Insufficient documentation

## 2021-08-10 DIAGNOSIS — I5022 Chronic systolic (congestive) heart failure: Secondary | ICD-10-CM

## 2021-08-10 DIAGNOSIS — I509 Heart failure, unspecified: Secondary | ICD-10-CM | POA: Diagnosis not present

## 2021-08-10 DIAGNOSIS — N186 End stage renal disease: Secondary | ICD-10-CM | POA: Diagnosis not present

## 2021-08-10 DIAGNOSIS — K4020 Bilateral inguinal hernia, without obstruction or gangrene, not specified as recurrent: Secondary | ICD-10-CM | POA: Insufficient documentation

## 2021-08-10 DIAGNOSIS — K420 Umbilical hernia with obstruction, without gangrene: Secondary | ICD-10-CM | POA: Insufficient documentation

## 2021-08-10 DIAGNOSIS — K429 Umbilical hernia without obstruction or gangrene: Secondary | ICD-10-CM | POA: Diagnosis not present

## 2021-08-10 DIAGNOSIS — Z7901 Long term (current) use of anticoagulants: Secondary | ICD-10-CM | POA: Insufficient documentation

## 2021-08-10 DIAGNOSIS — I4891 Unspecified atrial fibrillation: Secondary | ICD-10-CM | POA: Diagnosis not present

## 2021-08-10 DIAGNOSIS — Z01812 Encounter for preprocedural laboratory examination: Secondary | ICD-10-CM

## 2021-08-10 DIAGNOSIS — Z992 Dependence on renal dialysis: Secondary | ICD-10-CM | POA: Diagnosis not present

## 2021-08-10 DIAGNOSIS — I429 Cardiomyopathy, unspecified: Secondary | ICD-10-CM | POA: Insufficient documentation

## 2021-08-10 DIAGNOSIS — Z87891 Personal history of nicotine dependence: Secondary | ICD-10-CM | POA: Diagnosis not present

## 2021-08-10 DIAGNOSIS — K4090 Unilateral inguinal hernia, without obstruction or gangrene, not specified as recurrent: Secondary | ICD-10-CM | POA: Diagnosis not present

## 2021-08-10 HISTORY — DX: Bilateral inguinal hernia, without obstruction or gangrene, not specified as recurrent: K40.20

## 2021-08-10 HISTORY — PX: UMBILICAL HERNIA REPAIR: SHX196

## 2021-08-10 HISTORY — DX: Chronic kidney disease, stage 5: N18.5

## 2021-08-10 HISTORY — DX: Secondary hyperparathyroidism of renal origin: N25.81

## 2021-08-10 HISTORY — PX: INSERTION OF MESH: SHX5868

## 2021-08-10 HISTORY — PX: CAPD INSERTION: SHX5233

## 2021-08-10 LAB — POCT I-STAT, CHEM 8
BUN: 112 mg/dL — ABNORMAL HIGH (ref 8–23)
Calcium, Ion: 1.27 mmol/L (ref 1.15–1.40)
Chloride: 104 mmol/L (ref 98–111)
Creatinine, Ser: 5.2 mg/dL — ABNORMAL HIGH (ref 0.61–1.24)
Glucose, Bld: 97 mg/dL (ref 70–99)
HCT: 32 % — ABNORMAL LOW (ref 39.0–52.0)
Hemoglobin: 10.9 g/dL — ABNORMAL LOW (ref 13.0–17.0)
Potassium: 4.5 mmol/L (ref 3.5–5.1)
Sodium: 138 mmol/L (ref 135–145)
TCO2: 22 mmol/L (ref 22–32)

## 2021-08-10 LAB — KAPPA/LAMBDA LIGHT CHAINS
Kappa free light chain: 96.5 mg/L — ABNORMAL HIGH (ref 3.3–19.4)
Kappa, lambda light chain ratio: 0.36 (ref 0.26–1.65)
Lambda free light chains: 264.4 mg/L — ABNORMAL HIGH (ref 5.7–26.3)

## 2021-08-10 LAB — IGG, IGA, IGM
IgA: 386 mg/dL (ref 61–437)
IgG (Immunoglobin G), Serum: 762 mg/dL (ref 603–1613)
IgM (Immunoglobulin M), Srm: 39 mg/dL (ref 15–143)

## 2021-08-10 SURGERY — LAPAROSCOPIC INSERTION CONTINUOUS AMBULATORY PERITONEAL DIALYSIS  (CAPD) CATHETER
Anesthesia: General

## 2021-08-10 MED ORDER — FENTANYL CITRATE (PF) 100 MCG/2ML IJ SOLN
INTRAMUSCULAR | Status: AC
  Filled 2021-08-10: qty 2

## 2021-08-10 MED ORDER — HYDROCODONE-ACETAMINOPHEN 5-325 MG PO TABS: 1.0000 | ORAL_TABLET | ORAL | 0 refills | Status: DC | PRN

## 2021-08-10 MED ORDER — FENTANYL CITRATE (PF) 100 MCG/2ML IJ SOLN
INTRAMUSCULAR | Status: DC | PRN
  Administered 2021-08-10 (×2): 50 ug via INTRAVENOUS

## 2021-08-10 MED ORDER — FAMOTIDINE 20 MG PO TABS: 20.0000 mg | ORAL_TABLET | Freq: Once | ORAL | Status: AC

## 2021-08-10 MED ORDER — ACETAMINOPHEN 500 MG PO TABS: 1000.0000 mg | ORAL_TABLET | ORAL | Status: AC

## 2021-08-10 MED ORDER — FENTANYL CITRATE (PF) 100 MCG/2ML IJ SOLN: 25.0000 ug | INTRAMUSCULAR | Status: DC | PRN

## 2021-08-10 MED ORDER — PHENYLEPHRINE 80 MCG/ML (10ML) SYRINGE FOR IV PUSH (FOR BLOOD PRESSURE SUPPORT)
PREFILLED_SYRINGE | INTRAVENOUS | Status: DC | PRN
  Administered 2021-08-10: 160 ug via INTRAVENOUS
  Administered 2021-08-10: 80 ug via INTRAVENOUS

## 2021-08-10 MED ORDER — PHENYLEPHRINE HCL-NACL 20-0.9 MG/250ML-% IV SOLN
INTRAVENOUS | Status: AC
  Filled 2021-08-10: qty 250

## 2021-08-10 MED ORDER — DEXAMETHASONE SODIUM PHOSPHATE 10 MG/ML IJ SOLN
INTRAMUSCULAR | Status: DC | PRN
  Administered 2021-08-10: 10 mg via INTRAVENOUS

## 2021-08-10 MED ORDER — PROPOFOL 10 MG/ML IV BOLUS
INTRAVENOUS | Status: DC | PRN
  Administered 2021-08-10: 150 mg via INTRAVENOUS

## 2021-08-10 MED ORDER — CEFAZOLIN SODIUM-DEXTROSE 2-4 GM/100ML-% IV SOLN
2.0000 g | INTRAVENOUS | Status: AC
  Administered 2021-08-10: 2 g via INTRAVENOUS

## 2021-08-10 MED ORDER — ONDANSETRON HCL 4 MG/2ML IJ SOLN
INTRAMUSCULAR | Status: DC | PRN
  Administered 2021-08-10: 4 mg via INTRAVENOUS

## 2021-08-10 MED ORDER — PHENYLEPHRINE HCL-NACL 20-0.9 MG/250ML-% IV SOLN
INTRAVENOUS | Status: DC | PRN
  Administered 2021-08-10: 25 ug/min via INTRAVENOUS

## 2021-08-10 MED ORDER — BUPIVACAINE LIPOSOME 1.3 % IJ SUSP: 20.0000 mL | Freq: Once | INTRAMUSCULAR | Status: DC

## 2021-08-10 MED ORDER — FAMOTIDINE 20 MG PO TABS
ORAL_TABLET | ORAL | Status: AC
  Administered 2021-08-10: 20 mg via ORAL
  Filled 2021-08-10: qty 1

## 2021-08-10 MED ORDER — CHLORHEXIDINE GLUCONATE 0.12 % MT SOLN
OROMUCOSAL | Status: AC
  Administered 2021-08-10: 15 mL via OROMUCOSAL
  Filled 2021-08-10: qty 15

## 2021-08-10 MED ORDER — OXYCODONE HCL 5 MG/5ML PO SOLN: 5.0000 mg | Freq: Once | ORAL | Status: DC | PRN

## 2021-08-10 MED ORDER — ACETAMINOPHEN 500 MG PO TABS
ORAL_TABLET | ORAL | Status: AC
  Administered 2021-08-10: 1000 mg via ORAL
  Filled 2021-08-10: qty 2

## 2021-08-10 MED ORDER — ORAL CARE MOUTH RINSE: 15.0000 mL | Freq: Once | OROMUCOSAL | Status: AC

## 2021-08-10 MED ORDER — BUPIVACAINE-EPINEPHRINE (PF) 0.25% -1:200000 IJ SOLN
INTRAMUSCULAR | Status: AC
  Filled 2021-08-10: qty 30

## 2021-08-10 MED ORDER — ROCURONIUM BROMIDE 100 MG/10ML IV SOLN
INTRAVENOUS | Status: DC | PRN
  Administered 2021-08-10: 50 mg via INTRAVENOUS

## 2021-08-10 MED ORDER — BUPIVACAINE LIPOSOME 1.3 % IJ SUSP
INTRAMUSCULAR | Status: AC
  Filled 2021-08-10: qty 20

## 2021-08-10 MED ORDER — SODIUM CHLORIDE 0.9 % IV SOLN
INTRAVENOUS | Status: DC | PRN
  Administered 2021-08-10: 1000 mL

## 2021-08-10 MED ORDER — GLYCOPYRROLATE 0.2 MG/ML IJ SOLN
INTRAMUSCULAR | Status: DC | PRN
  Administered 2021-08-10: .2 mg via INTRAVENOUS

## 2021-08-10 MED ORDER — HEPARIN SODIUM (PORCINE) 10000 UNIT/ML IJ SOLN
INTRAMUSCULAR | Status: AC
  Filled 2021-08-10: qty 1

## 2021-08-10 MED ORDER — SODIUM CHLORIDE 0.9 % IV SOLN: INTRAVENOUS | Status: DC

## 2021-08-10 MED ORDER — BUPIVACAINE-EPINEPHRINE 0.25% -1:200000 IJ SOLN
INTRAMUSCULAR | Status: DC | PRN
  Administered 2021-08-10: 30 mL

## 2021-08-10 MED ORDER — OXYCODONE HCL 5 MG PO TABS: 5.0000 mg | ORAL_TABLET | Freq: Once | ORAL | Status: DC | PRN

## 2021-08-10 MED ORDER — CHLORHEXIDINE GLUCONATE 0.12 % MT SOLN: 15.0000 mL | Freq: Once | OROMUCOSAL | Status: AC

## 2021-08-10 MED ORDER — SUGAMMADEX SODIUM 200 MG/2ML IV SOLN
INTRAVENOUS | Status: DC | PRN
  Administered 2021-08-10: 300 mg via INTRAVENOUS

## 2021-08-10 MED ORDER — CHLORHEXIDINE GLUCONATE CLOTH 2 % EX PADS: 6.0000 | MEDICATED_PAD | Freq: Once | CUTANEOUS | Status: DC

## 2021-08-10 MED ORDER — LIDOCAINE HCL (CARDIAC) PF 100 MG/5ML IV SOSY
PREFILLED_SYRINGE | INTRAVENOUS | Status: DC | PRN
  Administered 2021-08-10: 100 mg via INTRAVENOUS

## 2021-08-10 MED ORDER — CEFAZOLIN SODIUM-DEXTROSE 2-4 GM/100ML-% IV SOLN
INTRAVENOUS | Status: AC
  Filled 2021-08-10: qty 100

## 2021-08-10 MED ORDER — GABAPENTIN 300 MG PO CAPS: 300.0000 mg | ORAL_CAPSULE | ORAL | Status: AC

## 2021-08-10 MED ORDER — GABAPENTIN 300 MG PO CAPS
ORAL_CAPSULE | ORAL | Status: AC
  Administered 2021-08-10: 300 mg via ORAL
  Filled 2021-08-10: qty 1

## 2021-08-10 MED ORDER — PHENYLEPHRINE HCL (PRESSORS) 10 MG/ML IV SOLN: INTRAVENOUS | Status: DC | PRN

## 2021-08-10 SURGICAL SUPPLY — 84 items
ADAPTER CATH DIALYSIS 4X8 IT L (MISCELLANEOUS) ×1 IMPLANT
ADAPTER TITANIUM MEDIONICS (MISCELLANEOUS) ×4 IMPLANT
BIOPATCH WHT 1IN DISK W/4.0 H (GAUZE/BANDAGES/DRESSINGS) ×1 IMPLANT
BLADE CLIPPER SURG (BLADE) ×1 IMPLANT
CANNULA REDUC XI 12-8 STAPL (CANNULA) ×1
CANNULA REDUCER 12-8 DVNC XI (CANNULA) ×3 IMPLANT
CATH EXTENDED DIALYSIS (CATHETERS) ×4 IMPLANT
COVER TIP SHEARS 8 DVNC (MISCELLANEOUS) ×3 IMPLANT
COVER TIP SHEARS 8MM DA VINCI (MISCELLANEOUS) ×1
COVER WAND RF STERILE (DRAPES) ×4 IMPLANT
DEFOGGER SCOPE WARMER CLEARIFY (MISCELLANEOUS) ×4 IMPLANT
DERMABOND ADVANCED (GAUZE/BANDAGES/DRESSINGS) ×1
DERMABOND ADVANCED .7 DNX12 (GAUZE/BANDAGES/DRESSINGS) ×3 IMPLANT
DRAPE 3/4 80X56 (DRAPES) ×3 IMPLANT
DRAPE ARM DVNC X/XI (DISPOSABLE) ×9 IMPLANT
DRAPE COLUMN DVNC XI (DISPOSABLE) ×3 IMPLANT
DRAPE DA VINCI XI ARM (DISPOSABLE) ×3
DRAPE DA VINCI XI COLUMN (DISPOSABLE) ×1
ELECT CAUTERY BLADE 6.4 (BLADE) ×4 IMPLANT
ELECT REM PT RETURN 9FT ADLT (ELECTROSURGICAL) ×4
ELECTRODE REM PT RTRN 9FT ADLT (ELECTROSURGICAL) ×3 IMPLANT
GLOVE BIO SURGEON STRL SZ7 (GLOVE) ×20 IMPLANT
GOWN STRL REUS W/ TWL LRG LVL3 (GOWN DISPOSABLE) ×12 IMPLANT
GOWN STRL REUS W/TWL LRG LVL3 (GOWN DISPOSABLE) ×4
GRASPER SUT TROCAR 14GX15 (MISCELLANEOUS) ×4 IMPLANT
IRRIGATION STRYKERFLOW (MISCELLANEOUS) ×3 IMPLANT
IRRIGATOR STRYKERFLOW (MISCELLANEOUS)
IV NS 1000ML (IV SOLUTION)
IV NS 1000ML BAXH (IV SOLUTION) ×3 IMPLANT
KIT PINK PAD W/HEAD ARE REST (MISCELLANEOUS) ×4
KIT PINK PAD W/HEAD ARM REST (MISCELLANEOUS) ×3 IMPLANT
KIT TURNOVER KIT A (KITS) ×4 IMPLANT
LABEL OR SOLS (LABEL) ×4 IMPLANT
MANIFOLD NEPTUNE II (INSTRUMENTS) ×4 IMPLANT
MESH 3DMAX 4X6 LT LRG (Mesh General) ×1 IMPLANT
MESH 3DMAX 4X6 RT LRG (Mesh General) ×1 IMPLANT
MINICAP W/POVIDONE IODINE SOL (MISCELLANEOUS) ×4 IMPLANT
NDL INSUFFLATION 14GA 120MM (NEEDLE) ×3 IMPLANT
NDL SAFETY ECLIPSE 18X1.5 (NEEDLE) ×3 IMPLANT
NEEDLE HYPO 18GX1.5 SHARP (NEEDLE) ×1
NEEDLE HYPO 22GX1.5 SAFETY (NEEDLE) ×4 IMPLANT
NEEDLE INSUFFLATION 14GA 120MM (NEEDLE) ×4 IMPLANT
NS IRRIG 500ML POUR BTL (IV SOLUTION) ×4 IMPLANT
OBTURATOR OPTICAL STANDARD 8MM (TROCAR) ×1
OBTURATOR OPTICAL STND 8 DVNC (TROCAR) ×3
OBTURATOR OPTICALSTD 8 DVNC (TROCAR) ×3 IMPLANT
PACK LAP CHOLECYSTECTOMY (MISCELLANEOUS) ×4 IMPLANT
PENCIL SMOKE EVACUATOR (MISCELLANEOUS) ×1 IMPLANT
SEAL CANN UNIV 5-8 DVNC XI (MISCELLANEOUS) ×9 IMPLANT
SEAL XI 5MM-8MM UNIVERSAL (MISCELLANEOUS) ×3
SET CYSTO W/LG BORE CLAMP LF (SET/KITS/TRAYS/PACK) ×4 IMPLANT
SET TRANSFER 6 W/TWIST CLAMP 5 (SET/KITS/TRAYS/PACK) ×4 IMPLANT
SET TUBE SMOKE EVAC HIGH FLOW (TUBING) ×4 IMPLANT
SLEEVE ENDOPATH XCEL 5M (ENDOMECHANICALS) ×4 IMPLANT
SOLUTION ELECTROLUBE (MISCELLANEOUS) ×4 IMPLANT
SPONGE DRAIN TRACH 4X4 STRL 2S (GAUZE/BANDAGES/DRESSINGS) ×4 IMPLANT
SPONGE T-LAP 18X18 ~~LOC~~+RFID (SPONGE) ×4 IMPLANT
STAPLER CANNULA SEAL DVNC XI (STAPLE) ×3 IMPLANT
STAPLER CANNULA SEAL XI (STAPLE) ×1
STYLET FALLER (MISCELLANEOUS) ×1 IMPLANT
SUT ETHIBOND 0 MO6 C/R (SUTURE) ×1 IMPLANT
SUT ETHILON 3-0 FS-10 30 BLK (SUTURE) ×4
SUT MNCRL 4-0 (SUTURE) ×1
SUT MNCRL 4-0 27XMFL (SUTURE) ×3
SUT MNCRL AB 4-0 PS2 18 (SUTURE) ×5 IMPLANT
SUT V-LOC 90 ABS 3-0 VLT  V-20 (SUTURE) ×2
SUT V-LOC 90 ABS 3-0 VLT V-20 (SUTURE) ×6 IMPLANT
SUT VIC AB 2-0 SH 27 (SUTURE) ×1
SUT VIC AB 2-0 SH 27XBRD (SUTURE) IMPLANT
SUT VIC AB 3-0 SH 27 (SUTURE) ×3
SUT VIC AB 3-0 SH 27X BRD (SUTURE) ×3 IMPLANT
SUT VICRYL 0 AB UR-6 (SUTURE) ×9 IMPLANT
SUTURE EHLN 3-0 FS-10 30 BLK (SUTURE) ×3 IMPLANT
SUTURE MNCRL 4-0 27XMF (SUTURE) ×3 IMPLANT
SYR 20ML LL LF (SYRINGE) ×4 IMPLANT
SYR 30ML LL (SYRINGE) ×4 IMPLANT
SYR 3ML LL SCALE MARK (SYRINGE) ×4 IMPLANT
SYR BULB IRRIG 60ML STRL (SYRINGE) ×1 IMPLANT
SYS KII FIOS ACCESS ABD 5X100 (TROCAR) ×4
SYSTEM KII FIOS ACES ABD 5X100 (TROCAR) ×3 IMPLANT
TAPE TRANSPORE STRL 2 31045 (GAUZE/BANDAGES/DRESSINGS) ×4 IMPLANT
TRAP FLUID SMOKE EVACUATOR (MISCELLANEOUS) ×4 IMPLANT
TROCAR XCEL NON-BLD 5MMX100MML (ENDOMECHANICALS) ×4 IMPLANT
WATER STERILE IRR 500ML POUR (IV SOLUTION) ×3 IMPLANT

## 2021-08-10 NOTE — Op Note (Signed)
Robotic assisted laparoscopic bilateral inguinal hernia repair with 3D max mesh Open incarcerted umbilical hernia repair 1.2 cm Laparoscopic placement of peritoneal Dialysis catheter ( Total three cuffs) Laparoscopic Omentopexy   Pre-operative Diagnosis: ESRD, umbilical hernia, bilateral inguinal hernias need to be addressed given peritoneal dialysis.   Post-operative Diagnosis: same     Surgeon: Caroleen Hamman, MD FACS   Anesthesia: Gen. with endotracheal tube      Findings: Bilateral direct inguinal hernias 1.2 cm chronically incarcerated umbilical hernia Catheter within pelvis Good return after infusing Peritoneal cavity   Estimated Blood Loss: 15cc              Complications: none     Procedure Details  The patient was seen again in the Holding Room. The benefits, complications, treatment options, and expected outcomes were discussed with the patient. The risks of bleeding, infection, recurrence of symptoms, failure to resolve symptoms, catheter malfunction bowel injury, any of which could require further surgery were reviewed with the patient. The likelihood of improving the patient's symptoms with return to their baseline status is good.  The patient and/or family concurred with the proposed plan, giving informed consent.  The patient was taken to Operating Room, identified and the procedure verified. A Time Out was held and the above information confirmed.   Prior to the induction of general anesthesia, antibiotic prophylaxis was administered. VTE prophylaxis was in place. General endotracheal anesthesia was then administered and tolerated well. After the induction, the abdomen was prepped with Chloraprep and draped in the sterile fashion. The patient was positioned in the supine position.   Periumbilical incision created, and umbilical defect was found and we dissected free the hernia sac from the umbilical skin.  There was evidence of very devitalized  and thin as well as  devascularized skin within the umbilicus. The hernia sac was excised and sent for permanent pathology.  Fascia elevated and incised and hasson trochar placed. Pneumoperitoneum obtained w/o HD changes. No evidence of bowel injuries.  Two 8 mm placed under direct vision. The laparoscopy revealed large indirect defects. I inserted the needles and the mesh. The robot was brought ot the table and docked in the standard fashion, no collision between arms was observed. Instruments were kept under direct view at all times. We started on the right side were a flap was created. The sac was reduced and dissected free from adjacent structures. We preserved the vas and the vessels. Once dissection was completed a large 3D mesh was placed and secured with two interrupted vicryl attached to the pubic tubercle. There was good coverage of the direct, indirect and femoral spaces. The flap was closed with v lock suture.   Attention then was turned to the left side were a flap was created. The sac was reduced and dissected free from adjacent structures. We preserved the vas and the vessels. Once dissection was completed a large 3D mesh was placed and secured with two interrupted vicryl attached to the pubic tubercle. There was good coverage of the direct, indirect and femoral spaces. The flap was closed with v lock suture. Second look revealed no complications or injuries.  Attention was turned to the placement of PD catheter.   The anterior rectus fascia identified and incised, rectus muscle identified and retracted laterally, using a port We were able to tunnel into the retro rectus space for about 6 cms. Peritoneum was pierced off the midline. Pneumoperitoneum was obtained w/o hemodynamic changes. We placed two additional laparoscopic ports under direct visualization.  We were able to thread the catheter via the laparoscopic port within the retrorectus space in the standard fashion.  Under direct visualization we  made sure that the coil portion of the catheter layed within the pelvis without any kinks.  With some evidence of epiploic appendicitis within the sigmoid colon that can potentially interfere with the patency of the catheter. I was able to also perform a counterincision in the left subcostal area and Tailored an additional extension of the catheter . The  Extension had 2 cuffs and I was able to connect it to parse together with the titanium connector in the standard fashion.  There was no evidence of any kinks.  The exit site was to the left  quadrant. We instilled heparinized saline a liter into the pelvis and we had very good return. He did have some omentum, attention was then turned to the omentum and using a PMI device we were able to perform an omentopexy and tacked the omentum to the abdominal wall using 2 interrupted 2-0 Vicryl sutures in the standard fashion.  The umbilical defect was closed using multiple interrupted 0 ethibond sutures. All skin incisions  were infiltrated with a liposomal Marcaine. 4-0 subcuticular Monocryl was used to close the skin. Dermabond was  applied. Sterile dressing applied to the catheter. The patient was then extubated and brought to the recovery room in stable condition. Sponge, lap, and needle counts were correct at closure and at the conclusion of the case.               Caroleen Hamman, MD, FACS

## 2021-08-10 NOTE — Anesthesia Postprocedure Evaluation (Signed)
Anesthesia Post Note  Patient: Lawrence Perkins.  Procedure(s) Performed: LAPAROSCOPIC INSERTION CONTINUOUS AMBULATORY PERITONEAL DIALYSIS  (CAPD) CATHETER XI ROBOTIC ASSISTED INGUINAL HERNIA (Bilateral) INSERTION OF MESH HERNIA REPAIR UMBILICAL ADULT  Patient location during evaluation: PACU Anesthesia Type: General Level of consciousness: awake and alert Pain management: pain level controlled Vital Signs Assessment: post-procedure vital signs reviewed and stable Respiratory status: spontaneous breathing, nonlabored ventilation, respiratory function stable and patient connected to nasal cannula oxygen Cardiovascular status: blood pressure returned to baseline and stable Postop Assessment: no apparent nausea or vomiting Anesthetic complications: no   No notable events documented.   Last Vitals:  Vitals:   08/10/21 1345 08/10/21 1400  BP: (!) 156/93 (!) 163/86  Pulse: (!) 56 74  Resp: 16 17  Temp:  37.1 C  SpO2: 97% 97%    Last Pain:  Vitals:   08/10/21 1400  TempSrc:   PainSc: 0-No pain                 Precious Haws Keva Darty

## 2021-08-10 NOTE — Transfer of Care (Signed)
Immediate Anesthesia Transfer of Care Note  Patient: Lawrence Perkins.  Procedure(s) Performed: LAPAROSCOPIC INSERTION CONTINUOUS AMBULATORY PERITONEAL DIALYSIS  (CAPD) CATHETER XI ROBOTIC ASSISTED INGUINAL HERNIA (Bilateral) INSERTION OF MESH HERNIA REPAIR UMBILICAL ADULT  Patient Location: PACU  Anesthesia Type:General  Level of Consciousness: awake, drowsy and patient cooperative  Airway & Oxygen Therapy: Patient Spontanous Breathing and Patient connected to face mask oxygen  Post-op Assessment: Report given to RN and Post -op Vital signs reviewed and stable  Post vital signs: Reviewed and stable  Last Vitals:  Vitals Value Taken Time  BP 185/92 08/10/21 1308  Temp    Pulse 58 08/10/21 1314  Resp 17 08/10/21 1314  SpO2 100 % 08/10/21 1314  Vitals shown include unvalidated device data.  Last Pain:  Vitals:   08/10/21 0930  TempSrc: Temporal  PainSc: 0-No pain         Complications: No notable events documented.

## 2021-08-10 NOTE — Anesthesia Preprocedure Evaluation (Signed)
Anesthesia Evaluation  Patient identified by MRN, date of birth, ID band Patient awake    Reviewed: Allergy & Precautions, NPO status , Patient's Chart, lab work & pertinent test results  History of Anesthesia Complications (+) PONV and history of anesthetic complications  Airway Mallampati: III  TM Distance: <3 FB Neck ROM: full    Dental  (+) Chipped, Poor Dentition   Pulmonary neg shortness of breath, former smoker,    Pulmonary exam normal        Cardiovascular Exercise Tolerance: Good hypertension, + CAD and +CHF  Normal cardiovascular exam     Neuro/Psych negative neurological ROS  negative psych ROS   GI/Hepatic negative GI ROS, Neg liver ROS,   Endo/Other  negative endocrine ROS  Renal/GU Renal disease     Musculoskeletal   Abdominal   Peds  Hematology negative hematology ROS (+)   Anesthesia Other Findings Past Medical History: No date: Aortic atherosclerosis (HCC) No date: Arthritis No date: Atrial fibrillation and flutter (HCC)     Comment:  a.) CHA2DS2-VASc = 5 (age x 2, CHF, HTN, aortic plaque).              b.) rate/rhythm maintain on oral carvedilol; chronically               anticoagulated with dose reduced apixaban No date: Bilateral inguinal hernia No date: BPH (benign prostatic hyperplasia) No date: Cardiac syncope No date: Cardiomyopathy [facility])     Comment:  a.) TTE 04/23/2016: EF 40%. b.) TTE 09/03/2016: EF 50%.               c.) TTE 05/04/2019: EF >55%. No date: CHF (congestive heart failure) ([place])     Comment:  a.) TTE 04/23/2016: EF 40%; BAE; mild AR/TR, mod MR;               G1DD. b.) TTE 09/03/2016: EF 50%; mild LVH; BAE; triv               AR/PR, mod MR/TR. c.) TTE 05/04/2019: EF >55%; mod LVH,               mild LA enlargement; mild AR/MR/TR No date: CKD (chronic kidney disease), stage V (HCC) No date: Complication of anesthesia     Comment:  a.) h/o intra/postoperative  A.fib No date: Coronary artery disease No date: Diverticulosis No date: Gout 11/14/2020: History of 2019 novel coronavirus disease (COVID-19) No date: History of colon polyps No date: Hypercholesteremia No date: Hypertension No date: Long term current use of anticoagulant     Comment:  a.) dose reduced apixaban; dose reduction d/t to age and              CKD Dx. 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)  Past Surgical History: 1985: ANKLE SURGERY; Right     Comment:  following volleyball injury No date: APPENDECTOMY     Comment:  at the of 9 yrs No date: COLONOSCOPY 01/02/2017: COLONOSCOPY WITH PROPOFOL; N/A     Comment:  Procedure: COLONOSCOPY WITH PROPOFOL;  Surgeon: Manya Silvas, MD;  Location: [facility] ENDOSCOPY;  Service:               Endoscopy;  Laterality: N/A; 03/27/2021: DIALYSIS/PERMA  CATHETER INSERTION; N/A     Comment:  Procedure: DIALYSIS/PERMA CATHETER INSERTION;  Surgeon:               Algernon Huxley, MD;  Location: [place] [facility];                Service: Cardiovascular;  Laterality: N/A; 05/08/2021: DIALYSIS/PERMA CATHETER REMOVAL; N/A     Comment:  Procedure: DIALYSIS/PERMA CATHETER REMOVAL;  Surgeon:               Algernon Huxley, MD;  Location: [place] [facility];                Service: Cardiovascular;  Laterality: N/A; No date: JOINT REPLACEMENT     Comment:  RIGHT TOTAL HIP 04/20/2021: KNEE ARTHROSCOPY WITH LATERAL RELEASE; Right     Comment:  Procedure: Right knee medial retinaculum and lateral               release with polyethylene exchange;  Surgeon: Hessie Knows, MD;  Location: [facility] ORS;  Service: Orthopedics;               Laterality: Right; No date: TONSILLECTOMY     Comment:  as a child 09/2009: TOTAL HIP  ARTHROPLASTY     Comment:  Right femoral neck fracture 03/06/2016: TOTAL HIP ARTHROPLASTY; Left     Comment:  Procedure: TOTAL HIP ARTHROPLASTY ANTERIOR APPROACH;                Surgeon: Hessie Knows, MD;  Location: [facility] ORS;  Service:              Orthopedics;  Laterality: Left; 03/21/2021: TOTAL KNEE ARTHROPLASTY; Right     Comment:  Procedure: TOTAL KNEE ARTHROPLASTY;  Surgeon: Hessie Knows, MD;  Location: [facility] ORS;  Service: Orthopedics;               Laterality: Right;  BMI    Body Mass Index: 25.83 kg/m      Reproductive/Obstetrics negative OB ROS                             Anesthesia Physical Anesthesia Plan  ASA: 3  Anesthesia Plan: General ETT   Post-op Pain Management:    Induction: Intravenous  PONV Risk Score and Plan: Ondansetron, Dexamethasone, Midazolam and Treatment may vary due to age or medical condition  Airway Management Planned: Oral ETT  Additional Equipment:   Intra-op Plan:   Post-operative Plan: Extubation in OR  Informed Consent: I have reviewed the patients History and Physical, chart, labs and discussed the procedure including the risks, benefits and alternatives for the proposed anesthesia with the patient or authorized representative who has indicated his/her understanding and acceptance.     Dental Advisory Given  Plan Discussed with: Anesthesiologist, CRNA and Surgeon  Anesthesia Plan Comments: (Patient consented for risks of anesthesia including but not limited to:  - adverse reactions to medications - damage to eyes, teeth, lips or other oral mucosa - nerve damage due to positioning  - sore throat or hoarseness - Damage to heart, brain, nerves, lungs, other parts of body or loss of life  Patient voiced understanding.)        Anesthesia Quick Evaluation

## 2021-08-10 NOTE — Interval H&P Note (Signed)
History and Physical Interval Note:  08/10/2021 9:53 AM  Lawrence Perkins.  has presented today for surgery, with the diagnosis of ESRD, inguinal hernia.  The various methods of treatment have been discussed with the patient and family. After consideration of risks, benefits and other options for treatment, the patient has consented to  Procedure(s): [place]  (CAPD) CATHETER (N/A) XI ROBOTIC ASSISTED INGUINAL HERNIA (Bilateral) as a surgical intervention.  The patient's history has been reviewed, patient examined, no change in status, stable for surgery.  I have reviewed the patient's chart and labs.  Questions were answered to the patient's satisfaction.     [place]

## 2021-08-10 NOTE — Anesthesia Procedure Notes (Signed)
Procedure Name: General with mask airway Date/Time: 08/10/2021 10:40 AM  Performed by: Kelton Pillar, CRNAPre-anesthesia Checklist: Patient identified, Emergency Drugs available, Suction available and Patient being monitored Patient Re-evaluated:Patient Re-evaluated prior to induction Oxygen Delivery Method: Circle system utilized Preoxygenation: Pre-oxygenation with 100% oxygen Induction Type: IV induction Ventilation: Mask ventilation without difficulty Laryngoscope Size: McGraph and 3 Grade View: Grade I Tube type: Oral Tube size: 7.0 mm Number of attempts: 1 Airway Equipment and Method: Stylet and Oral airway Placement Confirmation: ETT inserted through vocal cords under direct vision, positive ETCO2, breath sounds checked- equal and bilateral and CO2 detector Secured at: 21 cm Tube secured with: Tape Dental Injury: Teeth and Oropharynx as per pre-operative assessment

## 2021-08-10 NOTE — OR Nursing (Signed)
Bladder scan x 2 = 3 ml - MD aware, advises ok to d/c home without void.  Also advises pt can resume taking eliquis 72 hrs - added to d/c instructions/med section.

## 2021-08-10 NOTE — Discharge Instructions (Addendum)
Peritoneal Dialysis Catheter Placement, Care After The following information offers guidance on how to care for yourself after your procedure. Your health care provider may also give you more specific instructions. If you have problems or questions, contact your health care provider. What can I expect after the procedure? After the procedure, it is common to have some pain or discomfort in your abdomen and your incision area. You may need to wait 2 weeks after your procedure before you can start peritoneal dialysis treatment. If you need dialysis before that time, your health care provider may begin peritoneal dialysis treatment early or offer kidney dialysis treatments (hemodialysis) until you heal. Follow these instructions at home: Incision care  Follow instructions from your health care provider about how to take care of your incision or incisions. Make sure you: Wash your hands with soap and water for at least 20 seconds before and after you change your bandage (dressing). If soap and water are not available, use hand sanitizer. Change your dressing only as told by your health care provider. Your health care provider may tell you not to touch or change your dressing. Leave stitches (sutures), staples, skin glue, or adhesive strips in place. These skin closures may need to stay in place for 2 weeks or longer. If adhesive strip edges start to loosen and curl up, you may trim the loose edges. Do not remove adhesive strips completely unless your health care provider tells you to do that. Check your incision areas every day for signs of infection. If you were instructed not to touch or change your dressing, look at your dressing for signs of infection. Check for: Redness, swelling, or more pain. Fluid or blood. Warmth. Pus or a bad smell. Medicines Take over-the-counter and prescription medicines only as told by your health care provider. If you were prescribed an antibiotic medicine, use it as  told by your health care provider. Do not stop using the antibiotic even if you start to feel better. Ask your health care provider if the medicine prescribed to you requires you to avoid driving or using machinery. Driving Do not drive or ride in a car until your health care provider approves. Your seat belt could move the catheter out of position or cause irritation by rubbing on your incision. Activity  Rest and limit your activity. Do not lift anything that is heavier than 10 lb (4.5 kg), or the limit that you are told, until your health care provider says that it is safe. Return to your normal activities as told by your health care provider. Ask your health care provider what activities are safe for you. Managing constipation Your condition may cause constipation. To prevent or treat constipation, you may need to: Drink enough fluid to keep your urine pale yellow. Take over-the-counter or prescription medicines. Eat foods that are high in fiber, such as beans, whole grains, and fresh fruits and vegetables. Limit foods that are high in fat and processed sugars, such as fried or sweet foods. General instructions Do not use any products that contain nicotine or tobacco. These products include cigarettes, chewing tobacco, and vaping devices, such as e-cigarettes. If you need help quitting, ask your health care provider. Follow instructions from your health care provider about eating or drinking restrictions. Do not take baths, swim, or use a hot tub until your health care provider approves. Ask your health care provider if you may take showers. You may only be allowed to take sponge baths. Wear loose-fitting clothing that keeps  the catheter covered so that it cannot get caught on something. Keep your catheter clean and dry. Keep all follow-up visits. This is important. Contact a health care provider if: You have a fever or chills. You have warmth, redness, swelling, or more pain around an  incision. You have fluid or blood coming from an incision. You have pus or a bad smell coming from an incision. You cannot eat or drink without vomiting. Get help right away if: You have problems breathing. You are confused. You have trouble speaking. You have severe pain in your abdomen that does not get better with treatment. You have bright red blood in your stool (feces), or your stool is dark black and looks like tar. These symptoms may represent a serious problem that is an emergency. Do not wait to see if the symptoms will go away. Get medical help right away. Call your local emergency services (911 in the [place] Do not drive yourself to the hospital. Summary After the procedure, it is common to have some pain or discomfort in your abdomen, your incision area, or both. You may have to wait 2 weeks after your procedure before you can start peritoneal dialysis treatment. Check your incision area every day for signs of infection. Get medical help right away if you have severe pain in your abdomen that does not get better with treatment. This information is not intended to replace advice given to you by your health care provider. Make sure you discuss any questions you have with your health care provider. Document Revised: 08/06/2019 Document Reviewed: 08/06/2019 Elsevier Patient Education  [place].   Laparoscopic Inguinal Hernia Repair, Adult, Care After The following information offers guidance on how to care for yourself after your procedure. Your health care provider may also give you more specific instructions. If you have problems or questions, contact your health care provider. What can I expect after the procedure? After the procedure, it is common to have: Pain. Swelling and bruising around the incision area. Scrotal swelling, in males. Some fluid or blood draining from your incisions. Follow these instructions at home: Medicines Take over-the-counter and  prescription medicines only as told by your health care provider. Ask your health care provider if the medicine prescribed to you: Requires you to avoid driving or using machinery. Can cause constipation. You may need to take these actions to prevent or treat constipation: Drink enough fluid to keep your urine pale yellow. Take over-the-counter or prescription medicines. Eat foods that are high in fiber, such as beans, whole grains, and fresh fruits and vegetables. Limit foods that are high in fat and processed sugars, such as fried or sweet foods. Incision care  Follow instructions from your health care provider about how to take care of your incisions. Make sure you: Wash your hands with soap and water for at least 20 seconds before and after you change your bandage (dressing). If soap and water are not available, use hand sanitizer. Change your dressing as told by your health care provider. Leave stitches (sutures), skin glue, or adhesive strips in place. These skin closures may need to stay in place for 2 weeks or longer. If adhesive strip edges start to loosen and curl up, you may trim the loose edges. Do not remove adhesive strips completely unless your health care provider tells you to do that. Check your incision area every day for signs of infection. Check for: More redness, swelling, or pain. More fluid or blood. Warmth. Pus or a  bad smell. Wear loose, soft clothing while your incisions heal. Managing pain and swelling  If directed, put ice on the painful or swollen areas. To do this: Put ice in a plastic bag. Place a towel between your skin and the bag. Leave the ice on for 20 minutes, 2-3 times a day. Remove the ice if your skin turns bright red. This is very important. If you cannot feel pain, heat, or cold, you have a greater risk of damage to the area.   Activity Do not lift anything that is heavier than 10 lb (4.5 kg), or the limit that you are told, until your health  care provider says that it is safe. Ask your health care provider what activities are safe for you. A lot of activity during the first week after surgery can increase pain and swelling. For 1 week after your procedure: Avoid activities that take a lot of effort, such as exercise or sports. You may walk and climb stairs as needed for daily activity, but avoid long walks or climbing stairs for exercise. General instructions If you were given a sedative during the procedure, it can affect you for several hours. Do not drive or operate machinery until your health care provider says that it is safe. Do not take baths, swim, or use a hot tub until your health care provider approves. Ask your health care provider if you may take showers. You may only be allowed to take sponge baths. Do not use any products that contain nicotine or tobacco. These products include cigarettes, chewing tobacco, and vaping devices, such as e-cigarettes. If you need help quitting, ask your health care provider. Keep all follow-up visits. This is important. Contact a health care provider if: You have any of these signs of infection: More redness, swelling, or pain around your incisions or your groin area. More fluid or blood coming from an incision. Warmth coming from an incision. Pus or a bad smell coming from an incision. A fever or chills. You have more swelling in your scrotum, if you are male. You have severe pain and medicines do not help. You have abdominal pain or swelling. You cannot urinate or have a bowel movement. You faint or feel dizzy. You have nausea and vomiting. Get help right away if: You have redness, warmth, or pain in your leg. You have chest pain. You have problems breathing. These symptoms may represent a serious problem that is an emergency. Do not wait to see if the symptoms will go away. Get medical help right away. Call your local emergency services (911 in the [place] Do not drive yourself to  the hospital. Summary Pain, swelling, and bruising are common after the procedure. Check your incision area every day for signs of infection, such as more redness, swelling, or pain. Put ice on painful or swollen areas for 20 minutes, 2-3 times a day. This information is not intended to replace advice given to you by your health care provider. Make sure you discuss any questions you have with your health care provider. Document Revised: 08/18/2019 Document Reviewed: 08/18/2019 Elsevier Patient Education  [place]   The drugs that you were given will stay in your system until tomorrow so for the next 24 hours you should not:  Drive an automobile Make any legal decisions Drink any alcoholic beverage   You may resume regular meals tomorrow.  Today it is better to start with liquids  and gradually work up to solid foods.  You may eat anything you prefer, but it is better to start with liquids, then soup and crackers, and gradually work up to solid foods.   Please notify your doctor immediately if you have any unusual bleeding, trouble breathing, redness and pain at the surgery site, drainage, fever, or pain not relieved by medication.    Additional Instructions:     Please contact your physician with any problems or Same Day Surgery at [phone], Monday through Friday 6 am to 4 pm, or [facility] number at [phone].

## 2021-08-11 LAB — PROTEIN ELECTROPHORESIS, SERUM
A/G Ratio: 1.6 (ref 0.7–1.7)
Albumin ELP: 3.8 g/dL (ref 2.9–4.4)
Alpha-1-Globulin: 0.2 g/dL (ref 0.0–0.4)
Alpha-2-Globulin: 0.6 g/dL (ref 0.4–1.0)
Beta Globulin: 0.9 g/dL (ref 0.7–1.3)
Gamma Globulin: 0.8 g/dL (ref 0.4–1.8)
Globulin, Total: 2.4 g/dL (ref 2.2–3.9)
M-Spike, %: 0.3 g/dL — ABNORMAL HIGH
Total Protein ELP: 6.2 g/dL (ref 6.0–8.5)

## 2021-08-11 LAB — SURGICAL PATHOLOGY

## 2021-08-12 NOTE — Progress Notes (Unsigned)
[place]  Telephone:([phone] [phone]  ID: Lawrence Perkins. OB: July 16, 1940  MR#: [id number]  QQV#:[id number]  Patient Care Team: Kirk Ruths, MD as PCP - General (Internal Medicine)  CHIEF COMPLAINT: MGUS.  INTERVAL HISTORY: Patient returns to clinic today for repeat laboratory work and routine yearly evaluation.  He continues to feel well and remains asymptomatic. He has no neurologic complaints.  He denies any pain. He has a good appetite and denies weight loss.  He denies any chest pain, shortness of breath, cough, or hemoptysis.  He denies any nausea, vomiting, constipation, or diarrhea. He has no urinary complaints.  Patient feels at his baseline offers no specific complaints today.  REVIEW OF SYSTEMS:   Review of Systems  Constitutional: Negative.  Negative for fever, malaise/fatigue and weight loss.  Respiratory: Negative.  Negative for cough and shortness of breath.   Cardiovascular: Negative.  Negative for chest pain and leg swelling.  Gastrointestinal: Negative.  Negative for abdominal pain.  Genitourinary: Negative.  Negative for dysuria.  Musculoskeletal: Negative.  Negative for back pain.  Skin: Negative.  Negative for rash.  Neurological: Negative.  Negative for focal weakness, weakness and headaches.  Endo/Heme/Allergies:  Does not bruise/bleed easily.  Psychiatric/Behavioral: Negative.  The patient is not nervous/anxious.     As per HPI. Otherwise, a complete review of systems is negative.  PAST MEDICAL HISTORY: Past Medical History:  Diagnosis Date   Aortic atherosclerosis (HCC)    Arthritis    Atrial fibrillation and flutter (HCC)    a.) CHA2DS2-VASc = 5 (age x 2, CHF, HTN, aortic plaque). b.) rate/rhythm maintain on oral carvedilol; chronically anticoagulated with dose reduced apixaban   Bilateral inguinal hernia    BPH (benign prostatic hyperplasia)    Cardiac syncope    Cardiomyopathy ([place])    a.) TTE  04/23/2016: EF 40%. b.) TTE 09/03/2016: EF 50%. c.) TTE 05/04/2019: EF >55%.   CHF (congestive heart failure) ([place])    a.) TTE 04/23/2016: EF 40%; BAE; mild AR/TR, mod MR; G1DD. b.) TTE 09/03/2016: EF 50%; mild LVH; BAE; triv AR/PR, mod MR/TR. c.) TTE 05/04/2019: EF >55%; mod LVH, mild LA enlargement; mild AR/MR/TR   CKD (chronic kidney disease), stage V (HCC)    Complication of anesthesia    a.) h/o intra/postoperative A.fib   Coronary artery disease    Diverticulosis    Gout    History of 2019 novel coronavirus disease (COVID-19) 11/14/2020   History of colon polyps    Hypercholesteremia    Hypertension    Long term current use of anticoagulant    a.) dose reduced apixaban; dose reduction d/t to age and CKD Dx.   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[place])    a.) s/p resection   MGUS (monoclonal gammopathy of unknown significance)    Pleural effusion    Rectal bleeding    Secondary hyperparathyroidism of renal origin ([place])     PAST SURGICAL HISTORY: Past Surgical History:  Procedure Laterality Date   ANKLE SURGERY Right 1985   following volleyball injury   APPENDECTOMY     at the of 9 yrs   COLONOSCOPY     COLONOSCOPY WITH PROPOFOL N/A 01/02/2017   Procedure: COLONOSCOPY WITH PROPOFOL;  Surgeon: Manya Silvas, MD;  Location: [facility] ENDOSCOPY;  Service: Endoscopy;  Laterality: N/A;   DIALYSIS/PERMA CATHETER INSERTION N/A 03/27/2021   Procedure: DIALYSIS/PERMA CATHETER INSERTION;  Surgeon: Algernon Huxley, MD;  Location: [place] CV LAB;  Service: Cardiovascular;  Laterality: N/A;  DIALYSIS/PERMA CATHETER REMOVAL N/A 05/08/2021   Procedure: DIALYSIS/PERMA CATHETER REMOVAL;  Surgeon: Algernon Huxley, MD;  Location: [place] [facility];  Service: Cardiovascular;  Laterality: N/A;   JOINT REPLACEMENT     RIGHT TOTAL HIP   KNEE ARTHROSCOPY WITH LATERAL RELEASE Right 04/20/2021   Procedure: Right knee medial retinaculum and lateral release with polyethylene exchange;  Surgeon: Hessie Knows, MD;   Location: [facility] ORS;  Service: Orthopedics;  Laterality: Right;   TONSILLECTOMY     as a child   TOTAL HIP ARTHROPLASTY  09/2009   Right femoral neck fracture   TOTAL HIP ARTHROPLASTY Left 03/06/2016   Procedure: TOTAL HIP ARTHROPLASTY ANTERIOR APPROACH;  Surgeon: Hessie Knows, MD;  Location: [facility] ORS;  Service: Orthopedics;  Laterality: Left;   TOTAL KNEE ARTHROPLASTY Right 03/21/2021   Procedure: TOTAL KNEE ARTHROPLASTY;  Surgeon: Hessie Knows, MD;  Location: [facility] ORS;  Service: Orthopedics;  Laterality: Right;    FAMILY HISTORY: Family History  Problem Relation Age of Onset   Cirrhosis Mother    Other Father        Lung Fibrosis   Prostate cancer Neg Hx    Kidney cancer Neg Hx    Bladder Cancer Neg Hx     ADVANCED DIRECTIVES (Y/N):  N  HEALTH MAINTENANCE: Social History   Tobacco Use   Smoking status: Former    Years: 15.00    Types: Cigarettes    Quit date: 01/01/1981    Years since quitting: 40.6   Smokeless tobacco: Never  Vaping Use   Vaping Use: Never used  Substance Use Topics   Alcohol use: Not Currently   Drug use: No     Colonoscopy:  PAP:  Bone density:  Lipid panel:  No Known Allergies  Current Outpatient Medications  Medication Sig Dispense Refill   amLODipine (NORVASC) 5 MG tablet Take 1 tablet by mouth daily.     apixaban (ELIQUIS) 2.5 MG TABS tablet Take 2.5 mg by mouth 2 (two) times daily.     B Complex Vitamins (B-COMPLEX/B-12 PO) Take 1,000 mcg by mouth.     Calcium Carbonate-Vitamin D 600-5 MG-MCG TABS Take by mouth.     carvedilol (COREG) 3.125 MG tablet Take 3.125 mg by mouth 2 (two) times daily with a meal.      cholecalciferol (VITAMIN D3) 25 MCG (1000 UNIT) tablet Take 1,000 Units by mouth daily.     HYDROcodone-acetaminophen (NORCO/VICODIN) 5-325 MG tablet Take 1-2 tablets by mouth every 4 (four) hours as needed for moderate pain. 25 tablet 0   hydrocortisone cream 1 % Apply 1 application. topically 2 (two) times daily as needed for  itching.     Multiple Vitamin (MULTIVITAMIN WITH MINERALS) TABS tablet Take 1 tablet by mouth daily.     rosuvastatin (CRESTOR) 10 MG tablet Take 10 mg by mouth at bedtime.     tamsulosin (FLOMAX) 0.4 MG CAPS capsule Take 1 capsule (0.4 mg total) by mouth daily. 90 capsule 3   torsemide (DEMADEX) 20 MG tablet Take 2 tablets (40 mg total) by mouth daily. 60 tablet 0   No current facility-administered medications for this visit.    OBJECTIVE: There were no vitals filed for this visit.    There is no height or weight on file to calculate BMI.    ECOG FS:0 - Asymptomatic  General: Well-developed, well-nourished, no acute distress. Eyes: Pink conjunctiva, anicteric sclera. HEENT: Normocephalic, moist mucous membranes. Lungs: No audible wheezing or coughing. Heart: Regular rate and rhythm. Abdomen:  Soft, nontender, no obvious distention. Musculoskeletal: No edema, cyanosis, or clubbing. Neuro: Alert, answering all questions appropriately. Cranial nerves grossly intact. Skin: No rashes or petechiae noted. Psych: Normal affect.  LAB RESULTS:  Lab Results  Component Value Date   NA 138 08/10/2021   K 4.5 08/10/2021   CL 104 08/10/2021   CO2 23 08/09/2021   GLUCOSE 97 08/10/2021   BUN 112 (H) 08/10/2021   CREATININE 5.20 (H) 08/10/2021   CALCIUM 9.4 08/09/2021   PROT 6.8 08/09/2021   ALBUMIN 3.8 08/09/2021   AST 10 (L) 08/09/2021   ALT 11 08/09/2021   ALKPHOS 68 08/09/2021   BILITOT 0.4 08/09/2021   GFRNONAA 12 (L) 08/09/2021   GFRAA 22 (L) 09/15/2019    Lab Results  Component Value Date   WBC 6.8 08/09/2021   NEUTROABS 5.4 08/09/2021   HGB 10.9 (L) 08/10/2021   HCT 32.0 (L) 08/10/2021   MCV 98.5 08/09/2021   PLT 228 08/09/2021   Lab Results  Component Value Date   TOTALPROTELP 6.2 08/09/2021   ALBUMINELP 3.8 08/09/2021   A1GS 0.2 08/09/2021   A2GS 0.6 08/09/2021   BETS 0.9 08/09/2021   GAMS 0.8 08/09/2021   MSPIKE 0.3 (H) 08/09/2021   SPEI Comment 08/09/2021      STUDIES: No results found.  ASSESSMENT: MGUS  PLAN:    1. MGUS: Patient had a bone marrow biopsy on November 03, 2015 that revealed only a mild increased plasma cell population of approximately 5-10%. Patient was also noted to have cytogenetic abnormality of a monosomy 54 which is an average risk of multiple myeloma.  Patient's M spike has ranged between 0.3 and 0.6 since January 2018.  Today's result is 0.4.  Immunoglobulins continue to be within normal limits.  Both kappa and lambda free light chains are chronically elevated, but his ratio is normal.  Given his bone marrow results, patient is at mild to moderate risk to progress to multiple myeloma. Will consider a metastatic bone survey in the future if there is suspicion of progression of disease.  No intervention is needed at this time.  Return to clinic in 1 year for repeat laboratory work and routine evaluation. 2. Renal insufficiency: Chronic and unchanged.  Unlikely related to underlying MGUS or plasma cell population seen in bone marrow biopsy.  Continue to follow-up with nephrology as scheduled.  Patient reports he has an appointment later this week. 3.  Anemia: Nearly resolved.  Patient's most recent hemoglobin is 11.9.   Patient expressed understanding and was in agreement with this plan. He also understands that He can call clinic at any time with any questions, concerns, or complaints.    Lloyd Huger, MD   08/12/2021 2:06 PM

## 2021-08-14 ENCOUNTER — Encounter: Payer: Self-pay | Admitting: Surgery

## 2021-08-14 DIAGNOSIS — I10 Essential (primary) hypertension: Secondary | ICD-10-CM | POA: Diagnosis not present

## 2021-08-14 DIAGNOSIS — N185 Chronic kidney disease, stage 5: Secondary | ICD-10-CM | POA: Diagnosis not present

## 2021-08-14 DIAGNOSIS — N2581 Secondary hyperparathyroidism of renal origin: Secondary | ICD-10-CM | POA: Diagnosis not present

## 2021-08-14 DIAGNOSIS — D631 Anemia in chronic kidney disease: Secondary | ICD-10-CM | POA: Diagnosis not present

## 2021-08-16 ENCOUNTER — Encounter: Payer: Self-pay | Admitting: Oncology

## 2021-08-16 ENCOUNTER — Inpatient Hospital Stay: Payer: Medicare HMO | Admitting: Oncology

## 2021-08-16 VITALS — BP 119/72 | HR 52 | Temp 97.7°F | Resp 16 | Ht 70.0 in | Wt 177.6 lb

## 2021-08-16 DIAGNOSIS — D472 Monoclonal gammopathy: Secondary | ICD-10-CM | POA: Diagnosis not present

## 2021-08-16 DIAGNOSIS — N186 End stage renal disease: Secondary | ICD-10-CM | POA: Diagnosis not present

## 2021-08-16 DIAGNOSIS — I132 Hypertensive heart and chronic kidney disease with heart failure and with stage 5 chronic kidney disease, or end stage renal disease: Secondary | ICD-10-CM | POA: Diagnosis not present

## 2021-08-16 DIAGNOSIS — D649 Anemia, unspecified: Secondary | ICD-10-CM | POA: Diagnosis not present

## 2021-08-16 DIAGNOSIS — Z87891 Personal history of nicotine dependence: Secondary | ICD-10-CM | POA: Diagnosis not present

## 2021-08-16 DIAGNOSIS — I509 Heart failure, unspecified: Secondary | ICD-10-CM | POA: Diagnosis not present

## 2021-08-23 ENCOUNTER — Encounter: Payer: Self-pay | Admitting: Surgery

## 2021-08-23 ENCOUNTER — Ambulatory Visit (INDEPENDENT_AMBULATORY_CARE_PROVIDER_SITE_OTHER): Payer: Medicare HMO | Admitting: Surgery

## 2021-08-23 VITALS — BP 149/70 | HR 68 | Temp 97.6°F | Wt 176.4 lb

## 2021-08-23 DIAGNOSIS — Z09 Encounter for follow-up examination after completed treatment for conditions other than malignant neoplasm: Secondary | ICD-10-CM

## 2021-08-23 DIAGNOSIS — K4020 Bilateral inguinal hernia, without obstruction or gangrene, not specified as recurrent: Secondary | ICD-10-CM

## 2021-08-23 NOTE — Progress Notes (Signed)
S/p PD cath and [facility] 2 weeks out Doing well Taking PO Minimal pain Started teaching  PE NAD Abd: soft, cath in place no infection, incisions c/d/I  A/P Doing well w/o complications RTC prn

## 2021-08-23 NOTE — Patient Instructions (Addendum)
No lifting, pushing or pulling anything over 25 lbs for another 3 weeks.    If you have any concerns or questions, please feel free to call our office.   Peritoneal Dialysis Catheter Placement, Care After The following information offers guidance on how to care for yourself after your procedure. Your health care provider may also give you more specific instructions. If you have problems or questions, contact your health care provider. What can I expect after the procedure? After the procedure, it is common to have some pain or discomfort in your abdomen and your incision area. You may need to wait 2 weeks after your procedure before you can start peritoneal dialysis treatment. If you need dialysis before that time, your health care provider may begin peritoneal dialysis treatment early or offer kidney dialysis treatments (hemodialysis) until you heal. Follow these instructions at home: Incision care  Follow instructions from your health care provider about how to take care of your incision or incisions. Make sure you: Wash your hands with soap and water for at least 20 seconds before and after you change your bandage (dressing). If soap and water are not available, use hand sanitizer. Change your dressing only as told by your health care provider. Your health care provider may tell you not to touch or change your dressing. Leave stitches (sutures), staples, skin glue, or adhesive strips in place. These skin closures may need to stay in place for 2 weeks or longer. If adhesive strip edges start to loosen and curl up, you may trim the loose edges. Do not remove adhesive strips completely unless your health care provider tells you to do that. Check your incision areas every day for signs of infection. If you were instructed not to touch or change your dressing, look at your dressing for signs of infection. Check for: Redness, swelling, or more pain. Fluid or blood. Warmth. Pus or a bad  smell. Medicines Take over-the-counter and prescription medicines only as told by your health care provider. If you were prescribed an antibiotic medicine, use it as told by your health care provider. Do not stop using the antibiotic even if you start to feel better. Ask your health care provider if the medicine prescribed to you requires you to avoid driving or using machinery. Driving Do not drive or ride in a car until your health care provider approves. Your seat belt could move the catheter out of position or cause irritation by rubbing on your incision. Activity  Rest and limit your activity. Do not lift anything that is heavier than 10 lb (4.5 kg), or the limit that you are told, until your health care provider says that it is safe. Return to your normal activities as told by your health care provider. Ask your health care provider what activities are safe for you. Managing constipation Your condition may cause constipation. To prevent or treat constipation, you may need to: Drink enough fluid to keep your urine pale yellow. Take over-the-counter or prescription medicines. Eat foods that are high in fiber, such as beans, whole grains, and fresh fruits and vegetables. Limit foods that are high in fat and processed sugars, such as fried or sweet foods. General instructions Do not use any products that contain nicotine or tobacco. These products include cigarettes, chewing tobacco, and vaping devices, such as e-cigarettes. If you need help quitting, ask your health care provider. Follow instructions from your health care provider about eating or drinking restrictions. Do not take baths, swim, or use a  hot tub until your health care provider approves. Ask your health care provider if you may take showers. You may only be allowed to take sponge baths. Wear loose-fitting clothing that keeps the catheter covered so that it cannot get caught on something. Keep your catheter clean and dry. Keep  all follow-up visits. This is important. Contact a health care provider if: You have a fever or chills. You have warmth, redness, swelling, or more pain around an incision. You have fluid or blood coming from an incision. You have pus or a bad smell coming from an incision. You cannot eat or drink without vomiting. Get help right away if: You have problems breathing. You are confused. You have trouble speaking. You have severe pain in your abdomen that does not get better with treatment. You have bright red blood in your stool (feces), or your stool is dark black and looks like tar. These symptoms may represent a serious problem that is an emergency. Do not wait to see if the symptoms will go away. Get medical help right away. Call your local emergency services (911 in the [place] Do not drive yourself to the hospital. Summary After the procedure, it is common to have some pain or discomfort in your abdomen, your incision area, or both. You may have to wait 2 weeks after your procedure before you can start peritoneal dialysis treatment. Check your incision area every day for signs of infection. Get medical help right away if you have severe pain in your abdomen that does not get better with treatment. This information is not intended to replace advice given to you by your health care provider. Make sure you discuss any questions you have with your health care provider. Document Revised: 08/06/2019 Document Reviewed: 08/06/2019 Elsevier Patient Education  [place].

## 2021-08-24 DIAGNOSIS — N186 End stage renal disease: Secondary | ICD-10-CM | POA: Diagnosis not present

## 2021-08-24 DIAGNOSIS — Z992 Dependence on renal dialysis: Secondary | ICD-10-CM | POA: Diagnosis not present

## 2021-08-25 DIAGNOSIS — E785 Hyperlipidemia, unspecified: Secondary | ICD-10-CM | POA: Diagnosis not present

## 2021-08-25 DIAGNOSIS — Z5181 Encounter for therapeutic drug level monitoring: Secondary | ICD-10-CM | POA: Diagnosis not present

## 2021-08-25 DIAGNOSIS — Z992 Dependence on renal dialysis: Secondary | ICD-10-CM | POA: Diagnosis not present

## 2021-08-28 DIAGNOSIS — N186 End stage renal disease: Secondary | ICD-10-CM | POA: Diagnosis not present

## 2021-08-28 DIAGNOSIS — Z992 Dependence on renal dialysis: Secondary | ICD-10-CM | POA: Diagnosis not present

## 2021-08-29 DIAGNOSIS — Z992 Dependence on renal dialysis: Secondary | ICD-10-CM | POA: Diagnosis not present

## 2021-08-29 DIAGNOSIS — N186 End stage renal disease: Secondary | ICD-10-CM | POA: Diagnosis not present

## 2021-09-01 DIAGNOSIS — Z992 Dependence on renal dialysis: Secondary | ICD-10-CM | POA: Diagnosis not present

## 2021-09-01 DIAGNOSIS — N186 End stage renal disease: Secondary | ICD-10-CM | POA: Diagnosis not present

## 2021-09-05 DIAGNOSIS — N186 End stage renal disease: Secondary | ICD-10-CM | POA: Diagnosis not present

## 2021-09-05 DIAGNOSIS — Z992 Dependence on renal dialysis: Secondary | ICD-10-CM | POA: Diagnosis not present

## 2021-09-06 DIAGNOSIS — Z992 Dependence on renal dialysis: Secondary | ICD-10-CM | POA: Diagnosis not present

## 2021-09-06 DIAGNOSIS — N186 End stage renal disease: Secondary | ICD-10-CM | POA: Diagnosis not present

## 2021-09-07 DIAGNOSIS — Z992 Dependence on renal dialysis: Secondary | ICD-10-CM | POA: Diagnosis not present

## 2021-09-07 DIAGNOSIS — N186 End stage renal disease: Secondary | ICD-10-CM | POA: Diagnosis not present

## 2021-09-08 DIAGNOSIS — N186 End stage renal disease: Secondary | ICD-10-CM | POA: Diagnosis not present

## 2021-09-08 DIAGNOSIS — Z992 Dependence on renal dialysis: Secondary | ICD-10-CM | POA: Diagnosis not present

## 2021-09-09 DIAGNOSIS — N186 End stage renal disease: Secondary | ICD-10-CM | POA: Diagnosis not present

## 2021-09-09 DIAGNOSIS — Z992 Dependence on renal dialysis: Secondary | ICD-10-CM | POA: Diagnosis not present

## 2021-09-10 DIAGNOSIS — Z992 Dependence on renal dialysis: Secondary | ICD-10-CM | POA: Diagnosis not present

## 2021-09-10 DIAGNOSIS — N186 End stage renal disease: Secondary | ICD-10-CM | POA: Diagnosis not present

## 2021-09-11 DIAGNOSIS — N186 End stage renal disease: Secondary | ICD-10-CM | POA: Diagnosis not present

## 2021-09-11 DIAGNOSIS — Z992 Dependence on renal dialysis: Secondary | ICD-10-CM | POA: Diagnosis not present

## 2021-09-12 DIAGNOSIS — Z992 Dependence on renal dialysis: Secondary | ICD-10-CM | POA: Diagnosis not present

## 2021-09-12 DIAGNOSIS — N186 End stage renal disease: Secondary | ICD-10-CM | POA: Diagnosis not present

## 2021-09-12 DIAGNOSIS — R7309 Other abnormal glucose: Secondary | ICD-10-CM | POA: Diagnosis not present

## 2021-09-12 DIAGNOSIS — N184 Chronic kidney disease, stage 4 (severe): Secondary | ICD-10-CM | POA: Diagnosis not present

## 2021-09-12 DIAGNOSIS — I129 Hypertensive chronic kidney disease with stage 1 through stage 4 chronic kidney disease, or unspecified chronic kidney disease: Secondary | ICD-10-CM | POA: Diagnosis not present

## 2021-09-12 DIAGNOSIS — I5022 Chronic systolic (congestive) heart failure: Secondary | ICD-10-CM | POA: Diagnosis not present

## 2021-09-12 DIAGNOSIS — I2510 Atherosclerotic heart disease of native coronary artery without angina pectoris: Secondary | ICD-10-CM | POA: Diagnosis not present

## 2021-09-13 DIAGNOSIS — Z992 Dependence on renal dialysis: Secondary | ICD-10-CM | POA: Diagnosis not present

## 2021-09-13 DIAGNOSIS — N186 End stage renal disease: Secondary | ICD-10-CM | POA: Diagnosis not present

## 2021-09-14 DIAGNOSIS — Z992 Dependence on renal dialysis: Secondary | ICD-10-CM | POA: Diagnosis not present

## 2021-09-14 DIAGNOSIS — N186 End stage renal disease: Secondary | ICD-10-CM | POA: Diagnosis not present

## 2021-09-15 DIAGNOSIS — N186 End stage renal disease: Secondary | ICD-10-CM | POA: Diagnosis not present

## 2021-09-15 DIAGNOSIS — Z992 Dependence on renal dialysis: Secondary | ICD-10-CM | POA: Diagnosis not present

## 2021-09-16 DIAGNOSIS — N186 End stage renal disease: Secondary | ICD-10-CM | POA: Diagnosis not present

## 2021-09-16 DIAGNOSIS — Z992 Dependence on renal dialysis: Secondary | ICD-10-CM | POA: Diagnosis not present

## 2021-09-17 DIAGNOSIS — N186 End stage renal disease: Secondary | ICD-10-CM | POA: Diagnosis not present

## 2021-09-17 DIAGNOSIS — Z992 Dependence on renal dialysis: Secondary | ICD-10-CM | POA: Diagnosis not present

## 2021-09-18 ENCOUNTER — Ambulatory Visit (INDEPENDENT_AMBULATORY_CARE_PROVIDER_SITE_OTHER): Payer: Medicare HMO | Admitting: Surgery

## 2021-09-18 ENCOUNTER — Encounter: Payer: Self-pay | Admitting: Surgery

## 2021-09-18 VITALS — BP 149/70 | HR 58 | Temp 97.6°F | Wt 173.6 lb

## 2021-09-18 DIAGNOSIS — T85848A Pain due to other internal prosthetic devices, implants and grafts, initial encounter: Secondary | ICD-10-CM

## 2021-09-18 DIAGNOSIS — Z992 Dependence on renal dialysis: Secondary | ICD-10-CM

## 2021-09-18 DIAGNOSIS — N186 End stage renal disease: Secondary | ICD-10-CM

## 2021-09-18 NOTE — Patient Instructions (Signed)
Our surgery scheduler Pamala Hurry will call you within 24-48 hours to get you scheduled. If you have not heard from her after 48 hours, please call our office. Have the blue sheet available when she calls to write down important information.  If you have any concerns or questions, please feel free to call our office.  Peritoneal Dialysis Catheter Placement, Care After The following information offers guidance on how to care for yourself after your procedure. Your health care provider may also give you more specific instructions. If you have problems or questions, contact your health care provider. What can I expect after the procedure? After the procedure, it is common to have some pain or discomfort in your abdomen and your incision area. You may need to wait 2 weeks after your procedure before you can start peritoneal dialysis treatment. If you need dialysis before that time, your health care provider may begin peritoneal dialysis treatment early or offer kidney dialysis treatments (hemodialysis) until you heal. Follow these instructions at home: Incision care  Follow instructions from your health care provider about how to take care of your incision or incisions. Make sure you: Wash your hands with soap and water for at least 20 seconds before and after you change your bandage (dressing). If soap and water are not available, use hand sanitizer. Change your dressing only as told by your health care provider. Your health care provider may tell you not to touch or change your dressing. Leave stitches (sutures), staples, skin glue, or adhesive strips in place. These skin closures may need to stay in place for 2 weeks or longer. If adhesive strip edges start to loosen and curl up, you may trim the loose edges. Do not remove adhesive strips completely unless your health care provider tells you to do that. Check your incision areas every day for signs of infection. If you were instructed not to touch or  change your dressing, look at your dressing for signs of infection. Check for: Redness, swelling, or more pain. Fluid or blood. Warmth. Pus or a bad smell. Medicines Take over-the-counter and prescription medicines only as told by your health care provider. If you were prescribed an antibiotic medicine, use it as told by your health care provider. Do not stop using the antibiotic even if you start to feel better. Ask your health care provider if the medicine prescribed to you requires you to avoid driving or using machinery. Driving Do not drive or ride in a car until your health care provider approves. Your seat belt could move the catheter out of position or cause irritation by rubbing on your incision. Activity  Rest and limit your activity. Do not lift anything that is heavier than 10 lb (4.5 kg), or the limit that you are told, until your health care provider says that it is safe. Return to your normal activities as told by your health care provider. Ask your health care provider what activities are safe for you. Managing constipation Your condition may cause constipation. To prevent or treat constipation, you may need to: Drink enough fluid to keep your urine pale yellow. Take over-the-counter or prescription medicines. Eat foods that are high in fiber, such as beans, whole grains, and fresh fruits and vegetables. Limit foods that are high in fat and processed sugars, such as fried or sweet foods. General instructions Do not use any products that contain nicotine or tobacco. These products include cigarettes, chewing tobacco, and vaping devices, such as e-cigarettes. If you need help quitting,  ask your health care provider. Follow instructions from your health care provider about eating or drinking restrictions. Do not take baths, swim, or use a hot tub until your health care provider approves. Ask your health care provider if you may take showers. You may only be allowed to take  sponge baths. Wear loose-fitting clothing that keeps the catheter covered so that it cannot get caught on something. Keep your catheter clean and dry. Keep all follow-up visits. This is important. Contact a health care provider if: You have a fever or chills. You have warmth, redness, swelling, or more pain around an incision. You have fluid or blood coming from an incision. You have pus or a bad smell coming from an incision. You cannot eat or drink without vomiting. Get help right away if: You have problems breathing. You are confused. You have trouble speaking. You have severe pain in your abdomen that does not get better with treatment. You have bright red blood in your stool (feces), or your stool is dark black and looks like tar. These symptoms may represent a serious problem that is an emergency. Do not wait to see if the symptoms will go away. Get medical help right away. Call your local emergency services (911 in the [place] Do not drive yourself to the hospital. Summary After the procedure, it is common to have some pain or discomfort in your abdomen, your incision area, or both. You may have to wait 2 weeks after your procedure before you can start peritoneal dialysis treatment. Check your incision area every day for signs of infection. Get medical help right away if you have severe pain in your abdomen that does not get better with treatment. This information is not intended to replace advice given to you by your health care provider. Make sure you discuss any questions you have with your health care provider. Document Revised: 08/06/2019 Document Reviewed: 08/06/2019 Elsevier Patient Education  [place].

## 2021-09-18 NOTE — Progress Notes (Signed)
Outpatient Surgical Follow Up  09/18/2021  Lawrence Perkins. is an 81 y.o. male.   Chief Complaint  Patient presents with   Routine Post Op    PD cath 08/10/21    HPI: Lawrence Perkins is a 81 year old male end-stage renal disease I did place PD catheter 5 weeks ago along with repair of inguinal hernias and umbilical hernias.  He did well he is having no issues related to his recent surgery.  Main barrier for him is pain when doing peritoneal dialysis especially towards the end.  When he does manual drainage she does fine but at the end of the cycle he developed severe pain.  There is no issue regarding patency no fevers no chills  Past Medical History:  Diagnosis Date   Aortic atherosclerosis (HCC)    Arthritis    Atrial fibrillation and flutter (HCC)    a.) CHA2DS2-VASc = 5 (age x 2, CHF, HTN, aortic plaque). b.) rate/rhythm maintain on oral carvedilol; chronically anticoagulated with dose reduced apixaban   Bilateral inguinal hernia    BPH (benign prostatic hyperplasia)    Cardiac syncope    Cardiomyopathy ([place])    a.) TTE 04/23/2016: EF 40%. b.) TTE 09/03/2016: EF 50%. c.) TTE 05/04/2019: EF >55%.   CHF (congestive heart failure) ([place])    a.) TTE 04/23/2016: EF 40%; BAE; mild AR/TR, mod MR; G1DD. b.) TTE 09/03/2016: EF 50%; mild LVH; BAE; triv AR/PR, mod MR/TR. c.) TTE 05/04/2019: EF >55%; mod LVH, mild LA enlargement; mild AR/MR/TR   CKD (chronic kidney disease), stage V (HCC)    Complication of anesthesia    a.) h/o intra/postoperative A.fib   Coronary artery disease    Diverticulosis    Gout    History of 2019 novel coronavirus disease (COVID-19) 11/14/2020   History of colon polyps    Hypercholesteremia    Hypertension    Long term current use of anticoagulant    a.) dose reduced apixaban; dose reduction d/t to age and CKD Dx.   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[place])    a.) s/p resection   MGUS (monoclonal gammopathy of unknown significance)    Pleural effusion    Rectal bleeding    Secondary  hyperparathyroidism of renal origin [facility])     Past Surgical History:  Procedure Laterality Date   ANKLE SURGERY Right 1985   following volleyball injury   APPENDECTOMY     at the of 9 yrs   CAPD INSERTION N/A 08/10/2021   Procedure: LAPAROSCOPIC INSERTION CONTINUOUS AMBULATORY PERITONEAL DIALYSIS  (CAPD) CATHETER;  Surgeon: Jules Husbands, MD;  Location: [facility] ORS;  Service: General;  Laterality: N/A;   COLONOSCOPY     COLONOSCOPY WITH PROPOFOL N/A 01/02/2017   Procedure: COLONOSCOPY WITH PROPOFOL;  Surgeon: Manya Silvas, MD;  Location: [facility] ENDOSCOPY;  Service: Endoscopy;  Laterality: N/A;   DIALYSIS/PERMA CATHETER INSERTION N/A 03/27/2021   Procedure: DIALYSIS/PERMA CATHETER INSERTION;  Surgeon: Algernon Huxley, MD;  Location: [place] CV LAB;  Service: Cardiovascular;  Laterality: N/A;   DIALYSIS/PERMA CATHETER REMOVAL N/A 05/08/2021   Procedure: DIALYSIS/PERMA CATHETER REMOVAL;  Surgeon: Algernon Huxley, MD;  Location: [place] CV LAB;  Service: Cardiovascular;  Laterality: N/A;   INSERTION OF MESH  08/10/2021   Procedure: INSERTION OF MESH;  Surgeon: Jules Husbands, MD;  Location: [facility] ORS;  Service: General;;   JOINT REPLACEMENT     RIGHT TOTAL HIP   KNEE ARTHROSCOPY WITH LATERAL RELEASE Right 04/20/2021   Procedure: Right knee medial retinaculum  and lateral release with polyethylene exchange;  Surgeon: Hessie Knows, MD;  Location: [facility] ORS;  Service: Orthopedics;  Laterality: Right;   TONSILLECTOMY     as a child   TOTAL HIP ARTHROPLASTY  09/2009   Right femoral neck fracture   TOTAL HIP ARTHROPLASTY Left 03/06/2016   Procedure: TOTAL HIP ARTHROPLASTY ANTERIOR APPROACH;  Surgeon: Hessie Knows, MD;  Location: [facility] ORS;  Service: Orthopedics;  Laterality: Left;   TOTAL KNEE ARTHROPLASTY Right 03/21/2021   Procedure: TOTAL KNEE ARTHROPLASTY;  Surgeon: Hessie Knows, MD;  Location: [facility] ORS;  Service: Orthopedics;  Laterality: Right;   UMBILICAL HERNIA REPAIR  08/10/2021    Procedure: HERNIA REPAIR UMBILICAL ADULT;  Surgeon: Jules Husbands, MD;  Location: [facility] ORS;  Service: General;;    Family History  Problem Relation Age of Onset   Cirrhosis Mother    Other Father        Lung Fibrosis   Prostate cancer Neg Hx    Kidney cancer Neg Hx    Bladder Cancer Neg Hx     Social History:  reports that he quit smoking about 40 years ago. His smoking use included cigarettes. He has never used smokeless tobacco. He reports that he does not currently use alcohol. He reports that he does not use drugs.  Allergies: No Known Allergies  Medications reviewed.    ROS Full ROS performed and is otherwise negative other than what is stated in HPI   BP (!) 149/70   Pulse (!) 58   Temp 97.6 F (36.4 C) (Oral)   Wt 173 lb 9.6 oz (78.7 kg)   SpO2 98%   BMI 24.91 kg/m   Physical Exam Vitals and nursing note reviewed. Exam conducted with a chaperone present.  Constitutional:      General: He is not in acute distress.    Appearance: Normal appearance. He is not ill-appearing, toxic-appearing or diaphoretic.  Cardiovascular:     Rate and Rhythm: Normal rate and regular rhythm.     Heart sounds: No murmur heard.    No friction rub. No gallop.  Pulmonary:     Effort: Pulmonary effort is normal. No respiratory distress.     Breath sounds: Normal breath sounds. No stridor. No wheezing.  Abdominal:     General: Abdomen is flat. There is no distension.     Palpations: Abdomen is soft. There is no mass.     Tenderness: There is no abdominal tenderness. There is no guarding.     Hernia: No hernia is present.     Comments: Catheter in place no evidence of infection no evidence of peritonitis.  There is no evidence of any recurrent hernias  Musculoskeletal:     Cervical back: Normal range of motion and neck supple. No rigidity or tenderness.  Skin:    General: Skin is warm and dry.     Capillary Refill: Capillary refill takes less than 2 seconds.  Neurological:      General: No focal deficit present.     Mental Status: He is alert and oriented to person, place, and time.  Psychiatric:        Mood and Affect: Mood normal.        Behavior: Behavior normal.        Thought Content: Thought content normal.        Judgment: Judgment normal.     Assessment/Plan: PD drain pain.  Discussed with patient and the family in detail about this issue.  This is certainly  a complex 1.  Divina has already done different things he is still has symptoms.  One of the things we can do is potentially revise it and pexing the catheter more anteriorly.  I was very candid with him regarding the outcomes of this.  There is a good chance that this may not work and he will continues to have pain.  Some folks are more sensitive to peritoneal dialysis and sometimes this is not a good fit.  We will revise the catheter and hopefully pexied this in an attempt to ameliorate pain following peritoneal dialysis.  The Procedure was  discussed with patient detail.  Risk, benefits and possible complications include but not limited to: Bleeding, infection,  injuries.  He understands and wished to proceed  Caroleen Hamman, MD [place] Surgeon

## 2021-09-18 NOTE — H&P (View-Only) (Signed)
Outpatient Surgical Follow Up  09/18/2021  [name] is an 81 y.o. male.   Chief Complaint  Patient presents with   Routine Post Op    PD cath 08/10/21    HPI: [name] is a 81 year old male end-stage renal disease I did place PD catheter 5 weeks ago along with repair of inguinal hernias and umbilical hernias.  He did well he is having no issues related to his recent surgery.  Main barrier for him is pain when doing peritoneal dialysis especially towards the end.  When he does manual drainage she does fine but at the end of the cycle he developed severe pain.  There is no issue regarding patency no fevers no chills  Past Medical History:  Diagnosis Date   Aortic atherosclerosis (HCC)    Arthritis    Atrial fibrillation and flutter (HCC)    a.) CHA2DS2-VASc = 5 (age x 2, CHF, HTN, aortic plaque). b.) rate/rhythm maintain on oral carvedilol; chronically anticoagulated with dose reduced apixaban   Bilateral inguinal hernia    BPH (benign prostatic hyperplasia)    Cardiac syncope    Cardiomyopathy ([place])    a.) TTE 04/23/2016: EF 40%. b.) TTE 09/03/2016: EF 50%. c.) TTE 05/04/2019: EF >55%.   CHF (congestive heart failure) ([place])    a.) TTE 04/23/2016: EF 40%; BAE; mild AR/TR, mod MR; G1DD. b.) TTE 09/03/2016: EF 50%; mild LVH; BAE; triv AR/PR, mod MR/TR. c.) TTE 05/04/2019: EF >55%; mod LVH, mild LA enlargement; mild AR/MR/TR   CKD (chronic kidney disease), stage V (HCC)    Complication of anesthesia    a.) h/o intra/postoperative A.fib   Coronary artery disease    Diverticulosis    Gout    History of 2019 novel coronavirus disease (COVID-19) 11/14/2020   History of colon polyps    Hypercholesteremia    Hypertension    Long term current use of anticoagulant    a.) dose reduced apixaban; dose reduction d/t to age and CKD Dx.   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[place])    a.) s/p resection   MGUS (monoclonal gammopathy of unknown significance)    Pleural effusion    Rectal bleeding    Secondary  hyperparathyroidism of renal origin [facility])     Past Surgical History:  Procedure Laterality Date   ANKLE SURGERY Right 1985   following volleyball injury   APPENDECTOMY     at the of 9 yrs   CAPD INSERTION N/A 08/10/2021   Procedure: LAPAROSCOPIC INSERTION CONTINUOUS AMBULATORY PERITONEAL DIALYSIS  (CAPD) CATHETER;  Surgeon: Jules Husbands, MD;  Location: [facility] ORS;  Service: General;  Laterality: N/A;   COLONOSCOPY     COLONOSCOPY WITH PROPOFOL N/A 01/02/2017   Procedure: COLONOSCOPY WITH PROPOFOL;  Surgeon: Manya Silvas, MD;  Location[facility] ENDOSCOPY;  Service: Endoscopy;  Laterality: N/A;   DIALYSIS/PERMA CATHETER INSERTION N/A 03/27/2021   Procedure: DIALYSIS/PERMA CATHETER INSERTION;  Surgeon: Algernon Huxley, MD;  Location: [place] CV LAB;  Service: Cardiovascular;  Laterality: N/A;   DIALYSIS/PERMA CATHETER REMOVAL N/A 05/08/2021   Procedure: DIALYSIS/PERMA CATHETER REMOVAL;  Surgeon: Algernon Huxley, MD;  Location: [place] CV LAB;  Service: Cardiovascular;  Laterality: N/A;   INSERTION OF MESH  08/10/2021   Procedure: INSERTION OF MESH;  Surgeon: Jules Husbands, MD;  Location: [facility] ORS;  Service: General;;   JOINT REPLACEMENT     RIGHT TOTAL HIP   KNEE ARTHROSCOPY WITH LATERAL RELEASE Right 04/20/2021   Procedure: Right knee medial retinaculum  and lateral release with polyethylene exchange;  Surgeon: Hessie Knows, MD;  Location: [facility] ORS;  Service: Orthopedics;  Laterality: Right;   TONSILLECTOMY     as a child   TOTAL HIP ARTHROPLASTY  09/2009   Right femoral neck fracture   TOTAL HIP ARTHROPLASTY Left 03/06/2016   Procedure: TOTAL HIP ARTHROPLASTY ANTERIOR APPROACH;  Surgeon: Hessie Knows, MD;  Location: [facility] ORS;  Service: Orthopedics;  Laterality: Left;   TOTAL KNEE ARTHROPLASTY Right 03/21/2021   Procedure: TOTAL KNEE ARTHROPLASTY;  Surgeon: Hessie Knows, MD;  Location: [facility] ORS;  Service: Orthopedics;  Laterality: Right;   UMBILICAL HERNIA REPAIR  08/10/2021    Procedure: HERNIA REPAIR UMBILICAL ADULT;  Surgeon: Jules Husbands, MD;  Location: [facility] ORS;  Service: General;;    Family History  Problem Relation Age of Onset   Cirrhosis Mother    Other Father        Lung Fibrosis   Prostate cancer Neg Hx    Kidney cancer Neg Hx    Bladder Cancer Neg Hx     Social History:  reports that he quit smoking about 40 years ago. His smoking use included cigarettes. He has never used smokeless tobacco. He reports that he does not currently use alcohol. He reports that he does not use drugs.  Allergies: No Known Allergies  Medications reviewed.    ROS Full ROS performed and is otherwise negative other than what is stated in HPI   BP (!) 149/70   Pulse (!) 58   Temp 97.6 F (36.4 C) (Oral)   Wt 173 lb 9.6 oz (78.7 kg)   SpO2 98%   BMI 24.91 kg/m   Physical Exam Vitals and nursing note reviewed. Exam conducted with a chaperone present.  Constitutional:      General: He is not in acute distress.    Appearance: Normal appearance. He is not ill-appearing, toxic-appearing or diaphoretic.  Cardiovascular:     Rate and Rhythm: Normal rate and regular rhythm.     Heart sounds: No murmur heard.    No friction rub. No gallop.  Pulmonary:     Effort: Pulmonary effort is normal. No respiratory distress.     Breath sounds: Normal breath sounds. No stridor. No wheezing.  Abdominal:     General: Abdomen is flat. There is no distension.     Palpations: Abdomen is soft. There is no mass.     Tenderness: There is no abdominal tenderness. There is no guarding.     Hernia: No hernia is present.     Comments: Catheter in place no evidence of infection no evidence of peritonitis.  There is no evidence of any recurrent hernias  Musculoskeletal:     Cervical back: Normal range of motion and neck supple. No rigidity or tenderness.  Skin:    General: Skin is warm and dry.     Capillary Refill: Capillary refill takes less than 2 seconds.  Neurological:      General: No focal deficit present.     Mental Status: He is alert and oriented to person, place, and time.  Psychiatric:        Mood and Affect: Mood normal.        Behavior: Behavior normal.        Thought Content: Thought content normal.        Judgment: Judgment normal.     Assessment/Plan: PD drain pain.  Discussed with patient and the family in detail about this issue.  This is certainly  a complex 1.  Divina has already done different things he is still has symptoms.  One of the things we can do is potentially revise it and pexing the catheter more anteriorly.  I was very candid with him regarding the outcomes of this.  There is a good chance that this may not work and he will continues to have pain.  Some folks are more sensitive to peritoneal dialysis and sometimes this is not a good fit.  We will revise the catheter and hopefully pexied this in an attempt to ameliorate pain following peritoneal dialysis.  The Procedure was  discussed with patient detail.  Risk, benefits and possible complications include but not limited to: Bleeding, infection,  injuries.  He understands and wished to proceed  Caroleen Hamman, MD [place] Surgeon

## 2021-09-19 ENCOUNTER — Telehealth: Payer: Self-pay | Admitting: Surgery

## 2021-09-19 ENCOUNTER — Encounter
Admission: RE | Admit: 2021-09-19 | Discharge: 2021-09-19 | Disposition: A | Discharge status: Home / Self Care | Payer: Medicare HMO | Source: Ambulatory Visit | Attending: Surgery | Admitting: Surgery

## 2021-09-19 DIAGNOSIS — N186 End stage renal disease: Secondary | ICD-10-CM | POA: Diagnosis not present

## 2021-09-19 DIAGNOSIS — Z992 Dependence on renal dialysis: Secondary | ICD-10-CM | POA: Diagnosis not present

## 2021-09-19 HISTORY — DX: Anemia, unspecified: D64.9

## 2021-09-19 NOTE — Pre-Procedure Instructions (Signed)
Attempted to call patient x2 regarding upcoming surgery. Message left on patient's home voicemail to follow the same instructions that he was given previously for surgery last month. Call back number was left if he has any questions.

## 2021-09-19 NOTE — Telephone Encounter (Signed)
Patient has been advised of Pre-Admission date/time, and Surgery date at [facility].  Surgery Date: 09/21/21 Preadmission Testing Date: 09/19/21 (phone 1p-5p)  Patient has been made aware to call [phone], between 1-3:00pm the day before surgery, to find out what time to arrive for surgery.   '

## 2021-09-19 NOTE — Pre-Procedure Instructions (Signed)
Spoke with patient, surgical instructions reviewed. Last dose of Eliquis was 09/18/2021.

## 2021-09-20 DIAGNOSIS — N186 End stage renal disease: Secondary | ICD-10-CM | POA: Diagnosis not present

## 2021-09-20 DIAGNOSIS — Z992 Dependence on renal dialysis: Secondary | ICD-10-CM | POA: Diagnosis not present

## 2021-09-21 ENCOUNTER — Other Ambulatory Visit: Payer: Self-pay

## 2021-09-21 ENCOUNTER — Encounter
Admission: RE | Disposition: A | Discharge status: Home / Self Care | Payer: Self-pay | Source: Home / Self Care | Attending: Surgery

## 2021-09-21 ENCOUNTER — Ambulatory Visit
Admission: RE | Admit: 2021-09-21 | Discharge: 2021-09-21 | Disposition: A | Discharge status: Home / Self Care | Payer: Medicare HMO | Attending: Surgery | Admitting: Surgery

## 2021-09-21 ENCOUNTER — Encounter: Payer: Self-pay | Admitting: Surgery

## 2021-09-21 ENCOUNTER — Ambulatory Visit: Payer: Medicare HMO | Admitting: Urgent Care

## 2021-09-21 DIAGNOSIS — I132 Hypertensive heart and chronic kidney disease with heart failure and with stage 5 chronic kidney disease, or end stage renal disease: Secondary | ICD-10-CM | POA: Diagnosis not present

## 2021-09-21 DIAGNOSIS — I4892 Unspecified atrial flutter: Secondary | ICD-10-CM | POA: Diagnosis not present

## 2021-09-21 DIAGNOSIS — I10 Essential (primary) hypertension: Secondary | ICD-10-CM | POA: Diagnosis not present

## 2021-09-21 DIAGNOSIS — D631 Anemia in chronic kidney disease: Secondary | ICD-10-CM

## 2021-09-21 DIAGNOSIS — Z992 Dependence on renal dialysis: Secondary | ICD-10-CM | POA: Diagnosis not present

## 2021-09-21 DIAGNOSIS — T8249XA Other complication of vascular dialysis catheter, initial encounter: Secondary | ICD-10-CM | POA: Diagnosis not present

## 2021-09-21 DIAGNOSIS — N2581 Secondary hyperparathyroidism of renal origin: Secondary | ICD-10-CM | POA: Insufficient documentation

## 2021-09-21 DIAGNOSIS — T85848A Pain due to other internal prosthetic devices, implants and grafts, initial encounter: Secondary | ICD-10-CM | POA: Diagnosis not present

## 2021-09-21 DIAGNOSIS — N186 End stage renal disease: Secondary | ICD-10-CM | POA: Diagnosis not present

## 2021-09-21 DIAGNOSIS — I2510 Atherosclerotic heart disease of native coronary artery without angina pectoris: Secondary | ICD-10-CM | POA: Insufficient documentation

## 2021-09-21 DIAGNOSIS — I700 Atherosclerosis of aorta: Secondary | ICD-10-CM | POA: Diagnosis not present

## 2021-09-21 DIAGNOSIS — Z4902 Encounter for fitting and adjustment of peritoneal dialysis catheter: Secondary | ICD-10-CM | POA: Diagnosis not present

## 2021-09-21 DIAGNOSIS — I4891 Unspecified atrial fibrillation: Secondary | ICD-10-CM | POA: Diagnosis not present

## 2021-09-21 DIAGNOSIS — Z01812 Encounter for preprocedural laboratory examination: Secondary | ICD-10-CM

## 2021-09-21 DIAGNOSIS — N183 Chronic kidney disease, stage 3 unspecified: Secondary | ICD-10-CM

## 2021-09-21 DIAGNOSIS — Z87891 Personal history of nicotine dependence: Secondary | ICD-10-CM | POA: Diagnosis not present

## 2021-09-21 DIAGNOSIS — I509 Heart failure, unspecified: Secondary | ICD-10-CM | POA: Diagnosis not present

## 2021-09-21 HISTORY — PX: CAPD REVISION: SHX5260

## 2021-09-21 LAB — POCT I-STAT, CHEM 8
BUN: 50 mg/dL — ABNORMAL HIGH (ref 8–23)
Calcium, Ion: 1.21 mmol/L (ref 1.15–1.40)
Chloride: 101 mmol/L (ref 98–111)
Creatinine, Ser: 4.7 mg/dL — ABNORMAL HIGH (ref 0.61–1.24)
Glucose, Bld: 95 mg/dL (ref 70–99)
HCT: 32 % — ABNORMAL LOW (ref 39.0–52.0)
Hemoglobin: 10.9 g/dL — ABNORMAL LOW (ref 13.0–17.0)
Potassium: 4.2 mmol/L (ref 3.5–5.1)
Sodium: 139 mmol/L (ref 135–145)
TCO2: 27 mmol/L (ref 22–32)

## 2021-09-21 SURGERY — LAPAROSCOPIC REVISION CONTINUOUS AMBULATORY PERITONEAL DIALYSIS  (CAPD) CATHETER
Anesthesia: General | Site: Abdomen

## 2021-09-21 MED ORDER — CHLORHEXIDINE GLUCONATE 0.12 % MT SOLN: 15.0000 mL | Freq: Once | OROMUCOSAL | Status: AC

## 2021-09-21 MED ORDER — PHENYLEPHRINE HCL (PRESSORS) 10 MG/ML IV SOLN
INTRAVENOUS | Status: DC | PRN
  Administered 2021-09-21: 80 ug via INTRAVENOUS

## 2021-09-21 MED ORDER — ONDANSETRON HCL 4 MG/2ML IJ SOLN
INTRAMUSCULAR | Status: AC
  Filled 2021-09-21: qty 2

## 2021-09-21 MED ORDER — FAMOTIDINE 20 MG PO TABS: 20.0000 mg | ORAL_TABLET | Freq: Once | ORAL | Status: AC

## 2021-09-21 MED ORDER — HYDROCODONE-ACETAMINOPHEN 5-325 MG PO TABS: 1.0000 | ORAL_TABLET | Freq: Four times a day (QID) | ORAL | 0 refills | Status: DC | PRN

## 2021-09-21 MED ORDER — ONDANSETRON HCL 4 MG/2ML IJ SOLN: 4.0000 mg | Freq: Once | INTRAMUSCULAR | Status: DC | PRN

## 2021-09-21 MED ORDER — DEXAMETHASONE SODIUM PHOSPHATE 10 MG/ML IJ SOLN
INTRAMUSCULAR | Status: DC | PRN
  Administered 2021-09-21: 5 mg via INTRAVENOUS

## 2021-09-21 MED ORDER — ROCURONIUM BROMIDE 100 MG/10ML IV SOLN
INTRAVENOUS | Status: DC | PRN
  Administered 2021-09-21: 50 mg via INTRAVENOUS

## 2021-09-21 MED ORDER — SODIUM CHLORIDE 0.9 % IV SOLN: INTRAVENOUS | Status: DC

## 2021-09-21 MED ORDER — ACETAMINOPHEN 500 MG PO TABS: 1000.0000 mg | ORAL_TABLET | ORAL | Status: AC

## 2021-09-21 MED ORDER — BUPIVACAINE-EPINEPHRINE (PF) 0.25% -1:200000 IJ SOLN
INTRAMUSCULAR | Status: AC
  Filled 2021-09-21: qty 30

## 2021-09-21 MED ORDER — LIDOCAINE HCL (PF) 2 % IJ SOLN
INTRAMUSCULAR | Status: AC
  Filled 2021-09-21: qty 5

## 2021-09-21 MED ORDER — FAMOTIDINE 20 MG PO TABS
ORAL_TABLET | ORAL | Status: AC
  Administered 2021-09-21: 20 mg via ORAL
  Filled 2021-09-21: qty 1

## 2021-09-21 MED ORDER — ACETAMINOPHEN 10 MG/ML IV SOLN: 1000.0000 mg | Freq: Once | INTRAVENOUS | Status: DC | PRN

## 2021-09-21 MED ORDER — ROCURONIUM BROMIDE 10 MG/ML (PF) SYRINGE
PREFILLED_SYRINGE | INTRAVENOUS | Status: AC
  Filled 2021-09-21: qty 10

## 2021-09-21 MED ORDER — FENTANYL CITRATE (PF) 100 MCG/2ML IJ SOLN
INTRAMUSCULAR | Status: DC | PRN
  Administered 2021-09-21 (×2): 50 ug via INTRAVENOUS

## 2021-09-21 MED ORDER — CEFAZOLIN SODIUM-DEXTROSE 2-4 GM/100ML-% IV SOLN
INTRAVENOUS | Status: AC
  Filled 2021-09-21: qty 100

## 2021-09-21 MED ORDER — FENTANYL CITRATE (PF) 100 MCG/2ML IJ SOLN
INTRAMUSCULAR | Status: AC
  Filled 2021-09-21: qty 2

## 2021-09-21 MED ORDER — DEXAMETHASONE SODIUM PHOSPHATE 10 MG/ML IJ SOLN
INTRAMUSCULAR | Status: AC
  Filled 2021-09-21: qty 1

## 2021-09-21 MED ORDER — OXYCODONE HCL 5 MG PO TABS: 5.0000 mg | ORAL_TABLET | Freq: Once | ORAL | Status: DC | PRN

## 2021-09-21 MED ORDER — 0.9 % SODIUM CHLORIDE (POUR BTL) OPTIME
TOPICAL | Status: DC | PRN
  Administered 2021-09-21: 500 mL

## 2021-09-21 MED ORDER — ONDANSETRON HCL 4 MG/2ML IJ SOLN
INTRAMUSCULAR | Status: DC | PRN
  Administered 2021-09-21: 4 mg via INTRAVENOUS

## 2021-09-21 MED ORDER — BUPIVACAINE-EPINEPHRINE (PF) 0.25% -1:200000 IJ SOLN
INTRAMUSCULAR | Status: DC | PRN
  Administered 2021-09-21: 50 mL via INTRAMUSCULAR

## 2021-09-21 MED ORDER — PROPOFOL 10 MG/ML IV BOLUS
INTRAVENOUS | Status: AC
  Filled 2021-09-21: qty 20

## 2021-09-21 MED ORDER — PHENYLEPHRINE 80 MCG/ML (10ML) SYRINGE FOR IV PUSH (FOR BLOOD PRESSURE SUPPORT)
PREFILLED_SYRINGE | INTRAVENOUS | Status: AC
  Filled 2021-09-21: qty 10

## 2021-09-21 MED ORDER — CHLORHEXIDINE GLUCONATE 0.12 % MT SOLN
OROMUCOSAL | Status: AC
  Administered 2021-09-21: 15 mL via OROMUCOSAL
  Filled 2021-09-21: qty 15

## 2021-09-21 MED ORDER — OXYCODONE HCL 5 MG/5ML PO SOLN: 5.0000 mg | Freq: Once | ORAL | Status: DC | PRN

## 2021-09-21 MED ORDER — HEPARIN SODIUM (PORCINE) 10000 UNIT/ML IJ SOLN
INTRAMUSCULAR | Status: AC
  Filled 2021-09-21: qty 1

## 2021-09-21 MED ORDER — HEPARIN SODIUM (PORCINE) 10000 UNIT/ML IJ SOLN
INTRAMUSCULAR | Status: DC | PRN
  Administered 2021-09-21: 10000 [IU]

## 2021-09-21 MED ORDER — PROPOFOL 10 MG/ML IV BOLUS
INTRAVENOUS | Status: DC | PRN
  Administered 2021-09-21: 120 mg via INTRAVENOUS

## 2021-09-21 MED ORDER — BUPIVACAINE LIPOSOME 1.3 % IJ SUSP
INTRAMUSCULAR | Status: AC
  Filled 2021-09-21: qty 20

## 2021-09-21 MED ORDER — FENTANYL CITRATE (PF) 100 MCG/2ML IJ SOLN: 25.0000 ug | INTRAMUSCULAR | Status: DC | PRN

## 2021-09-21 MED ORDER — SUGAMMADEX SODIUM 200 MG/2ML IV SOLN
INTRAVENOUS | Status: DC | PRN
  Administered 2021-09-21: 350 mg via INTRAVENOUS

## 2021-09-21 MED ORDER — CHLORHEXIDINE GLUCONATE CLOTH 2 % EX PADS: 6.0000 | MEDICATED_PAD | Freq: Once | CUTANEOUS | Status: DC

## 2021-09-21 MED ORDER — CEFAZOLIN SODIUM-DEXTROSE 2-4 GM/100ML-% IV SOLN
2.0000 g | INTRAVENOUS | Status: AC
  Administered 2021-09-21: 2 g via INTRAVENOUS

## 2021-09-21 MED ORDER — GABAPENTIN 300 MG PO CAPS
ORAL_CAPSULE | ORAL | Status: AC
  Filled 2021-09-21: qty 1

## 2021-09-21 MED ORDER — ORAL CARE MOUTH RINSE: 15.0000 mL | Freq: Once | OROMUCOSAL | Status: AC

## 2021-09-21 MED ORDER — LIDOCAINE HCL (CARDIAC) PF 100 MG/5ML IV SOSY
PREFILLED_SYRINGE | INTRAVENOUS | Status: DC | PRN
  Administered 2021-09-21: 100 mg via INTRAVENOUS

## 2021-09-21 MED ORDER — ACETAMINOPHEN 500 MG PO TABS
ORAL_TABLET | ORAL | Status: AC
  Administered 2021-09-21: 1000 mg via ORAL
  Filled 2021-09-21: qty 2

## 2021-09-21 SURGICAL SUPPLY — 52 items
ADAPTER CATH DIALYSIS 4X8 IT L (MISCELLANEOUS) IMPLANT
ADAPTER TITANIUM MEDIONICS (MISCELLANEOUS) ×1 IMPLANT
ADH SKN CLS APL DERMABOND .7 (GAUZE/BANDAGES/DRESSINGS) ×1
ADPR DLYS CATH STRL LF DISP (MISCELLANEOUS)
BIOPATCH WHT 1IN DISK W/4.0 H (GAUZE/BANDAGES/DRESSINGS) IMPLANT
BLADE CLIPPER SURG (BLADE) IMPLANT
CATH EXTENDED DIALYSIS (CATHETERS) ×1 IMPLANT
DERMABOND ADVANCED .7 DNX12 (GAUZE/BANDAGES/DRESSINGS) ×1 IMPLANT
ELECT CAUTERY BLADE TIP 2.5 (TIP) ×1
ELECT REM PT RETURN 9FT ADLT (ELECTROSURGICAL) ×1
ELECTRODE CAUTERY BLDE TIP 2.5 (TIP) ×1 IMPLANT
ELECTRODE REM PT RTRN 9FT ADLT (ELECTROSURGICAL) ×1 IMPLANT
GLOVE BIO SURGEON STRL SZ7 (GLOVE) ×1 IMPLANT
GOWN STRL REUS W/ TWL LRG LVL3 (GOWN DISPOSABLE) ×2 IMPLANT
GOWN STRL REUS W/TWL LRG LVL3 (GOWN DISPOSABLE) ×2
GRASPER SUT TROCAR 14GX15 (MISCELLANEOUS) ×1 IMPLANT
IV NS 1000ML (IV SOLUTION) ×1
IV NS 1000ML BAXH (IV SOLUTION) ×1 IMPLANT
KIT TURNOVER KIT A (KITS) ×1 IMPLANT
L-HOOK LAP DISP 36CM (ELECTROSURGICAL) ×1
LHOOK LAP DISP 36CM (ELECTROSURGICAL) IMPLANT
MANIFOLD NEPTUNE II (INSTRUMENTS) ×1 IMPLANT
MINICAP W/POVIDONE IODINE SOL (MISCELLANEOUS) ×1 IMPLANT
NDL INSUFFLATION 14GA 120MM (NEEDLE) ×1 IMPLANT
NDL SAFETY ECLIP 18X1.5 (MISCELLANEOUS) ×1 IMPLANT
NEEDLE HYPO 22GX1.5 SAFETY (NEEDLE) ×1 IMPLANT
NEEDLE INSUFFLATION 14GA 120MM (NEEDLE) ×1 IMPLANT
NS IRRIG 500ML POUR BTL (IV SOLUTION) ×1 IMPLANT
PACK LAP CHOLECYSTECTOMY (MISCELLANEOUS) ×1 IMPLANT
PENCIL SMOKE EVACUATOR (MISCELLANEOUS) ×1 IMPLANT
SET CYSTO W/LG BORE CLAMP LF (SET/KITS/TRAYS/PACK) ×1 IMPLANT
SET TRANSFER 6 W/TWIST CLAMP 5 (SET/KITS/TRAYS/PACK) ×1 IMPLANT
SET TUBE SMOKE EVAC HIGH FLOW (TUBING) ×1 IMPLANT
SLEEVE Z-THREAD 5X100MM (TROCAR) ×1 IMPLANT
SPONGE DRAIN TRACH 4X4 STRL 2S (GAUZE/BANDAGES/DRESSINGS) ×1 IMPLANT
SPONGE T-LAP 18X18 ~~LOC~~+RFID (SPONGE) ×1 IMPLANT
STYLET FALLER (MISCELLANEOUS) IMPLANT
SUT ETHILON 3-0 FS-10 30 BLK (SUTURE) ×1
SUT MNCRL 4-0 (SUTURE) ×1
SUT MNCRL 4-0 27XMFL (SUTURE) ×1
SUT VIC AB 3-0 SH 27 (SUTURE)
SUT VIC AB 3-0 SH 27X BRD (SUTURE) ×1 IMPLANT
SUT VICRYL 0 AB UR-6 (SUTURE) ×2 IMPLANT
SUTURE EHLN 3-0 FS-10 30 BLK (SUTURE) ×1 IMPLANT
SUTURE MNCRL 4-0 27XMF (SUTURE) ×1 IMPLANT
SYR 20ML LL LF (SYRINGE) ×1 IMPLANT
SYR 3ML LL SCALE MARK (SYRINGE) ×1 IMPLANT
SYS KII FIOS ACCESS ABD 5X100 (TROCAR) ×1
SYSTEM KII FIOS ACES ABD 5X100 (TROCAR) ×1 IMPLANT
TRAP FLUID SMOKE EVACUATOR (MISCELLANEOUS) ×1 IMPLANT
TROCAR XCEL NON-BLD 5MMX100MML (ENDOMECHANICALS) ×1 IMPLANT
WATER STERILE IRR 500ML POUR (IV SOLUTION) ×1 IMPLANT

## 2021-09-21 NOTE — Anesthesia Procedure Notes (Signed)
Procedure Name: Intubation Date/Time: 09/21/2021 9:32 AM  Performed by: Cammie Sickle, CRNAPre-anesthesia Checklist: Patient identified, Patient being monitored, Timeout performed, Emergency Drugs available and Suction available Patient Re-evaluated:Patient Re-evaluated prior to induction Oxygen Delivery Method: Circle system utilized Preoxygenation: Pre-oxygenation with 100% oxygen Induction Type: IV induction Ventilation: Mask ventilation without difficulty Laryngoscope Size: 3 and McGraph Grade View: Grade I Tube type: Oral Tube size: 7.0 mm Number of attempts: 1 Airway Equipment and Method: Stylet Placement Confirmation: ETT inserted through vocal cords under direct vision, positive ETCO2 and breath sounds checked- equal and bilateral Secured at: 22 cm Tube secured with: Tape Dental Injury: Teeth and Oropharynx as per pre-operative assessment

## 2021-09-21 NOTE — Op Note (Signed)
Laparoscopic revision of peritoneal Dialysis catheter Laparoscopic Omentopexy   Pre-operative Diagnosis: ESRD, drain pain from PD   Post-operative Diagnosis: same     Surgeon: Caroleen Hamman, MD FACS   Anesthesia: Gen. with endotracheal tube      Findings: Catheter within pelvis, evidence of extra tongue of omentum that potentially may have interfere w drainage No evidence of intraluminal debris or clots No evidence of abdominal wall hernias or infection After placing anchoring stitch catheter with excellent return of fluid  after infusing Peritoneal cavity   Estimated Blood Loss: 5cc            Complications: none     Procedure Details  The patient was seen again in the Holding Room. The benefits, complications, treatment options, and expected outcomes were discussed with the patient. The risks of bleeding, infection, recurrence of symptoms, failure to resolve symptoms, catheter malfunction bowel injury, any of which could require further surgery were reviewed with the patient. The likelihood of improving the patient's symptoms with return to their baseline status is good.  The patient and/or family concurred with the proposed plan, giving informed consent.  The patient was taken to Operating Room, identified and the procedure verified. A Time Out was held and the above information confirmed.   Prior to the induction of general anesthesia, antibiotic prophylaxis was administered. VTE prophylaxis was in place. General endotracheal anesthesia was then administered and tolerated well. After the induction, the abdomen was prepped with Chloraprep and draped in the sterile fashion. The patient was positioned in the supine position.   Catheter inspected , I used the catheter and connected insufflation,  Pneumoperitoneum was obtained w/o hemodynamic changes. We placed two additional laparoscopic ports under direct visualization using optiview technique, no evidence of injuries  observed. Catheter was exteriorized and flushed no evidence of kinks or intraluminal debris observed. We saw redundant omentum that would benefit from omentopexy to prevent drainage issues . Using a PMI device we were able to perform an omentopexy and tacked the omentum to the abdominal wall using 2 interrupted -0 Vicryl sutures in the standard fashion. I also placed transfacial 0 vicryl suture around suprapubic area to fixed the catheter to prevent migration. We instilled heparinized saline a liter into the pelvis and we had very good return. All skin incisions  were infiltrated with a liposomal Marcaine. 4-0 subcuticular Monocryl was used to close the skin. Dermabond was  applied. Sterile dressing applied to the catheter. The patient was then extubated and brought to the recovery room in stable condition. Sponge, lap, and needle counts were correct at closure and at the conclusion of the case.              Caroleen Hamman, MD, FACS

## 2021-09-21 NOTE — Transfer of Care (Signed)
Immediate Anesthesia Transfer of Care Note  Patient: Lawrence Perkins.  Procedure(s) Performed: LAPAROSCOPIC REVISION CONTINUOUS AMBULATORY PERITONEAL DIALYSIS  (CAPD) CATHETER (Abdomen)  Patient Location: PACU  Anesthesia Type:General  Level of Consciousness: drowsy  Airway & Oxygen Therapy: Patient Spontanous Breathing and Patient connected to face mask oxygen  Post-op Assessment: Report given to RN and Post -op Vital signs reviewed and stable  Post vital signs: Reviewed and stable  Last Vitals:  Vitals Value Taken Time  BP 164/92 09/21/21 1044  Temp 36 C 09/21/21 1044  Pulse 57 09/21/21 1044  Resp 18 09/21/21 1044  SpO2 100 % 09/21/21 1044  Vitals shown include unvalidated device data.  Last Pain:  Vitals:   09/21/21 0805  TempSrc: Temporal  PainSc: 0-No pain         Complications: No notable events documented.

## 2021-09-21 NOTE — Interval H&P Note (Signed)
History and Physical Interval Note:  09/21/2021 9:01 AM  [name]  has presented today for surgery, with the diagnosis of ESRD.  The various methods of treatment have been discussed with the patient and family. After consideration of risks, benefits and other options for treatment, the patient has consented to  Procedure(s): [place]  (CAPD) CATHETER (N/A) as a surgical intervention.  The patient's history has been reviewed, patient examined, no change in status, stable for surgery.  I have reviewed the patient's chart and labs.  Questions were answered to the patient's satisfaction.     [place]

## 2021-09-21 NOTE — Anesthesia Postprocedure Evaluation (Signed)
Anesthesia Post Note  Patient: Lawrence Perkins.  Procedure(s) Performed: LAPAROSCOPIC REVISION CONTINUOUS AMBULATORY PERITONEAL DIALYSIS  (CAPD) CATHETER (Abdomen)  Patient location during evaluation: PACU Anesthesia Type: General Level of consciousness: awake and alert Pain management: pain level controlled Vital Signs Assessment: post-procedure vital signs reviewed and stable Respiratory status: spontaneous breathing, nonlabored ventilation, respiratory function stable and patient connected to nasal cannula oxygen Cardiovascular status: blood pressure returned to baseline and stable Postop Assessment: no apparent nausea or vomiting Anesthetic complications: no   No notable events documented.   Last Vitals:  Vitals:   09/21/21 1126 09/21/21 1139  BP: (!) 152/83 (!) 144/89  Pulse: (!) 48 (!) 50  Resp: 16 16  Temp: (!) 36.1 C (!) 36.2 C  SpO2: 100% 100%    Last Pain:  Vitals:   09/21/21 1139  TempSrc: Temporal  PainSc: 0-No pain                 Arita Miss

## 2021-09-21 NOTE — OR Nursing (Signed)
MAY RESUME TAKING ELIQUIS TOMORROW PER DR. PABON, SECURE-CHAT.  ADDED TO D/C INSTRUCTIONS; MED SECTION.

## 2021-09-21 NOTE — Discharge Instructions (Addendum)
Peritoneal Dialysis Catheter Placement, Care After The following information offers guidance on how to care for yourself after your procedure. Your health care provider may also give you more specific instructions. If you have problems or questions, contact your health care provider. What can I expect after the procedure? After the procedure, it is common to have some pain or discomfort in your abdomen and your incision area. You may need to wait 2 weeks after your procedure before you can start peritoneal dialysis treatment. If you need dialysis before that time, your health care provider may begin peritoneal dialysis treatment early or offer kidney dialysis treatments (hemodialysis) until you heal. Follow these instructions at home: Incision care  Follow instructions from your health care provider about how to take care of your incision or incisions. Make sure you: Wash your hands with soap and water for at least 20 seconds before and after you change your bandage (dressing). If soap and water are not available, use hand sanitizer. Change your dressing only as told by your health care provider. Your health care provider may tell you not to touch or change your dressing. Leave stitches (sutures), staples, skin glue, or adhesive strips in place. These skin closures may need to stay in place for 2 weeks or longer. If adhesive strip edges start to loosen and curl up, you may trim the loose edges. Do not remove adhesive strips completely unless your health care provider tells you to do that. Check your incision areas every day for signs of infection. If you were instructed not to touch or change your dressing, look at your dressing for signs of infection. Check for: Redness, swelling, or more pain. Fluid or blood. Warmth. Pus or a bad smell. Medicines Take over-the-counter and prescription medicines only as told by your health care provider. If you were prescribed an antibiotic medicine, use it as  told by your health care provider. Do not stop using the antibiotic even if you start to feel better. Ask your health care provider if the medicine prescribed to you requires you to avoid driving or using machinery. Driving Do not drive or ride in a car until your health care provider approves. Your seat belt could move the catheter out of position or cause irritation by rubbing on your incision. Activity  Rest and limit your activity. Do not lift anything that is heavier than 10 lb (4.5 kg), or the limit that you are told, until your health care provider says that it is safe. Return to your normal activities as told by your health care provider. Ask your health care provider what activities are safe for you. Managing constipation Your condition may cause constipation. To prevent or treat constipation, you may need to: Drink enough fluid to keep your urine pale yellow. Take over-the-counter or prescription medicines. Eat foods that are high in fiber, such as beans, whole grains, and fresh fruits and vegetables. Limit foods that are high in fat and processed sugars, such as fried or sweet foods. General instructions Do not use any products that contain nicotine or tobacco. These products include cigarettes, chewing tobacco, and vaping devices, such as e-cigarettes. If you need help quitting, ask your health care provider. Follow instructions from your health care provider about eating or drinking restrictions. Do not take baths, swim, or use a hot tub until your health care provider approves. Ask your health care provider if you may take showers. You may only be allowed to take sponge baths. Wear loose-fitting clothing that keeps  the catheter covered so that it cannot get caught on something. Keep your catheter clean and dry. Keep all follow-up visits. This is important. Contact a health care provider if: You have a fever or chills. You have warmth, redness, swelling, or more pain around an  incision. You have fluid or blood coming from an incision. You have pus or a bad smell coming from an incision. You cannot eat or drink without vomiting. Get help right away if: You have problems breathing. You are confused. You have trouble speaking. You have severe pain in your abdomen that does not get better with treatment. You have bright red blood in your stool (feces), or your stool is dark black and looks like tar. These symptoms may represent a serious problem that is an emergency. Do not wait to see if the symptoms will go away. Get medical help right away. Call your local emergency services (911 in the [place] Do not drive yourself to the hospital. Summary After the procedure, it is common to have some pain or discomfort in your abdomen, your incision area, or both. You may have to wait 2 weeks after your procedure before you can start peritoneal dialysis treatment. Check your incision area every day for signs of infection. Get medical help right away if you have severe pain in your abdomen that does not get better with treatment. This information is not intended to replace advice given to you by your health care provider. Make sure you discuss any questions you have with your health care provider. Document Revised: 08/06/2019 Document Reviewed: 08/06/2019 Elsevier Patient Education  Paint   The drugs that you were given will stay in your system until tomorrow so for the next 24 hours you should not:  Drive an automobile Make any legal decisions Drink any alcoholic beverage   You may resume regular meals tomorrow.  Today it is better to start with liquids and gradually work up to solid foods.  You may eat anything you prefer, but it is better to start with liquids, then soup and crackers, and gradually work up to solid foods.   Please notify your doctor immediately if you have any unusual bleeding,  trouble breathing, redness and pain at the surgery site, drainage, fever, or pain not relieved by medication.     Your post-operative visit with Dr.                                       is: Date:                        Time:    Please call to schedule your post-operative visit.  Additional Instructions:

## 2021-09-21 NOTE — Anesthesia Preprocedure Evaluation (Signed)
Anesthesia Evaluation  Patient identified by MRN, date of birth, ID band Patient awake    Reviewed: Allergy & Precautions, NPO status , Patient's Chart, lab work & pertinent test results  History of Anesthesia Complications (+) PONV and history of anesthetic complications  Airway Mallampati: III  TM Distance: <3 FB Neck ROM: full    Dental  (+) Chipped, Poor Dentition   Pulmonary neg shortness of breath, former smoker,    Pulmonary exam normal        Cardiovascular Exercise Tolerance: Good hypertension, + CAD  (-) Past MI, (-) Cardiac Stents and (-) CHF Normal cardiovascular exam     Neuro/Psych negative neurological ROS  negative psych ROS   GI/Hepatic negative GI ROS, Neg liver ROS,   Endo/Other  negative endocrine ROS  Renal/GU ESRF and DialysisRenal diseaseLast dialyzed through PD cath yesterday, feels well from fluid standpoint today     Musculoskeletal   Abdominal   Peds  Hematology negative hematology ROS (+)   Anesthesia Other Findings Past Medical History: No date: Aortic atherosclerosis (HCC) No date: Arthritis No date: Atrial fibrillation and flutter (HCC)     Comment:  a.) CHA2DS2-VASc = 5 (age x 2, CHF, HTN, aortic plaque).              b.) rate/rhythm maintain on oral carvedilol; chronically               anticoagulated with dose reduced apixaban No date: Bilateral inguinal hernia No date: BPH (benign prostatic hyperplasia) No date: Cardiac syncope No date: Cardiomyopathy [facility])     Comment:  a.) TTE 04/23/2016: EF 40%. b.) TTE 09/03/2016: EF 50%.               c.) TTE 05/04/2019: EF >55%. No date: CHF (congestive heart failure) ([place])     Comment:  a.) TTE 04/23/2016: EF 40%; BAE; mild AR/TR, mod MR;               G1DD. b.) TTE 09/03/2016: EF 50%; mild LVH; BAE; triv               AR/PR, mod MR/TR. c.) TTE 05/04/2019: EF >55%; mod LVH,               mild LA enlargement; mild AR/MR/TR No date:  CKD (chronic kidney disease), stage V (HCC) No date: Complication of anesthesia     Comment:  a.) h/o intra/postoperative A.fib No date: Coronary artery disease No date: Diverticulosis No date: Gout 11/14/2020: History of 2019 novel coronavirus disease (COVID-19) No date: History of colon polyps No date: Hypercholesteremia No date: Hypertension No date: Long term current use of anticoagulant     Comment:  a.) dose reduced apixaban; dose reduction d/t to age and              CKD Dx. 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[place])     Comment:  a.) s/p resection No date: MGUS (monoclonal gammopathy of unknown significance) No date: Pleural effusion No date: Rectal bleeding No date: Secondary hyperparathyroidism of renal origin [facility])  Past Surgical History: 1985: ANKLE SURGERY; Right     Comment:  following volleyball injury No date: APPENDECTOMY     Comment:  at the of 9 yrs No date: COLONOSCOPY 01/02/2017: COLONOSCOPY WITH PROPOFOL; N/A     Comment:  Procedure: COLONOSCOPY WITH PROPOFOL;  Surgeon: Manya Silvas, MD;  Location: [facility] ENDOSCOPY;  Service:  Endoscopy;  Laterality: N/A; 03/27/2021: DIALYSIS/PERMA CATHETER INSERTION; N/A     Comment:  Procedure: DIALYSIS/PERMA CATHETER INSERTION;  Surgeon:               Algernon Huxley, MD;  Location: [place] CV LAB;                Service: Cardiovascular;  Laterality: N/A; 05/08/2021: DIALYSIS/PERMA CATHETER REMOVAL; N/A     Comment:  Procedure: DIALYSIS/PERMA CATHETER REMOVAL;  Surgeon:               Algernon Huxley, MD;  Location: [employer] CV LAB;                Service: Cardiovascular;  Laterality: N/A; No date: JOINT REPLACEMENT     Comment:  RIGHT TOTAL HIP 04/20/2021: KNEE ARTHROSCOPY WITH LATERAL RELEASE; Right     Comment:  Procedure: Right knee medial retinaculum and lateral               release with polyethylene exchange;  Surgeon: Hessie Knows, MD;  Location: [facility] ORS;  Service:  Orthopedics;               Laterality: Right; No date: TONSILLECTOMY     Comment:  as a child 09/2009: TOTAL HIP ARTHROPLASTY     Comment:  Right femoral neck fracture 03/06/2016: TOTAL HIP ARTHROPLASTY; Left     Comment:  Procedure: TOTAL HIP ARTHROPLASTY ANTERIOR APPROACH;                Surgeon: Hessie Knows, MD;  Location: [facility] ORS;  Service:              Orthopedics;  Laterality: Left; 03/21/2021: TOTAL KNEE ARTHROPLASTY; Right     Comment:  Procedure: TOTAL KNEE ARTHROPLASTY;  Surgeon: Hessie Knows, MD;  Location: [facility] ORS;  Service: Orthopedics;               Laterality: Right;  BMI    Body Mass Index: 25.83 kg/m      Reproductive/Obstetrics negative OB ROS                             Anesthesia Physical  Anesthesia Plan  ASA: 4  Anesthesia Plan: General   Post-op Pain Management: Tylenol PO (pre-op)*   Induction: Intravenous  PONV Risk Score and Plan: 4 or greater and Ondansetron, Dexamethasone and Treatment may vary due to age or medical condition  Airway Management Planned: Oral ETT  Additional Equipment: None  Intra-op Plan:   Post-operative Plan: Extubation in OR  Informed Consent: I have reviewed the patients History and Physical, chart, labs and discussed the procedure including the risks, benefits and alternatives for the proposed anesthesia with the patient or authorized representative who has indicated his/her understanding and acceptance.     Dental Advisory Given  Plan Discussed with: Anesthesiologist, CRNA and Surgeon  Anesthesia Plan Comments: (Patient consented for risks of anesthesia including but not limited to:  - adverse reactions to medications - damage to eyes, teeth, lips or other oral mucosa - nerve damage due to positioning  - sore throat or hoarseness - Damage to heart, brain, nerves, lungs, other parts of body or loss of life  Patient voiced understanding.)        Anesthesia  Quick Evaluation

## 2021-09-22 ENCOUNTER — Encounter: Payer: Self-pay | Admitting: Surgery

## 2021-09-22 DIAGNOSIS — N186 End stage renal disease: Secondary | ICD-10-CM | POA: Diagnosis not present

## 2021-09-22 DIAGNOSIS — Z992 Dependence on renal dialysis: Secondary | ICD-10-CM | POA: Diagnosis not present

## 2021-09-23 ENCOUNTER — Other Ambulatory Visit: Payer: Self-pay

## 2021-09-23 ENCOUNTER — Emergency Department
Admission: EM | Admit: 2021-09-23 | Discharge: 2021-09-23 | Disposition: A | Discharge status: Home / Self Care | Payer: Medicare HMO | Attending: Emergency Medicine | Admitting: Emergency Medicine

## 2021-09-23 ENCOUNTER — Emergency Department: Payer: Medicare HMO

## 2021-09-23 DIAGNOSIS — I4891 Unspecified atrial fibrillation: Secondary | ICD-10-CM | POA: Insufficient documentation

## 2021-09-23 DIAGNOSIS — I2510 Atherosclerotic heart disease of native coronary artery without angina pectoris: Secondary | ICD-10-CM | POA: Insufficient documentation

## 2021-09-23 DIAGNOSIS — R1031 Right lower quadrant pain: Secondary | ICD-10-CM | POA: Diagnosis not present

## 2021-09-23 DIAGNOSIS — I509 Heart failure, unspecified: Secondary | ICD-10-CM | POA: Diagnosis not present

## 2021-09-23 DIAGNOSIS — N186 End stage renal disease: Secondary | ICD-10-CM | POA: Diagnosis not present

## 2021-09-23 DIAGNOSIS — N200 Calculus of kidney: Secondary | ICD-10-CM | POA: Diagnosis not present

## 2021-09-23 DIAGNOSIS — I110 Hypertensive heart disease with heart failure: Secondary | ICD-10-CM | POA: Diagnosis not present

## 2021-09-23 DIAGNOSIS — N281 Cyst of kidney, acquired: Secondary | ICD-10-CM | POA: Diagnosis not present

## 2021-09-23 DIAGNOSIS — Z992 Dependence on renal dialysis: Secondary | ICD-10-CM | POA: Diagnosis not present

## 2021-09-23 LAB — URINALYSIS, ROUTINE W REFLEX MICROSCOPIC
Bilirubin Urine: NEGATIVE
Glucose, UA: NEGATIVE mg/dL
Ketones, ur: NEGATIVE mg/dL
Leukocytes,Ua: NEGATIVE
Nitrite: NEGATIVE
Protein, ur: >300 mg/dL — AB
Specific Gravity, Urine: 1.01 (ref 1.005–1.030)
Squamous Epithelial / HPF: NONE SEEN (ref 0–5)
pH: 5.5 (ref 5.0–8.0)

## 2021-09-23 LAB — COMPREHENSIVE METABOLIC PANEL
ALT: 7 U/L (ref 0–44)
AST: 13 U/L — ABNORMAL LOW (ref 15–41)
Albumin: 3.8 g/dL (ref 3.5–5.0)
Alkaline Phosphatase: 63 U/L (ref 38–126)
Anion gap: 10 (ref 5–15)
BUN: 78 mg/dL — ABNORMAL HIGH (ref 8–23)
CO2: 26 mmol/L (ref 22–32)
Calcium: 9.2 mg/dL (ref 8.9–10.3)
Chloride: 104 mmol/L (ref 98–111)
Creatinine, Ser: 4.73 mg/dL — ABNORMAL HIGH (ref 0.61–1.24)
GFR, Estimated: 12 mL/min — ABNORMAL LOW (ref >60)
Glucose, Bld: 91 mg/dL (ref 70–99)
Potassium: 4.4 mmol/L (ref 3.5–5.1)
Sodium: 140 mmol/L (ref 135–145)
Total Bilirubin: 0.6 mg/dL (ref 0.3–1.2)
Total Protein: 6.8 g/dL (ref 6.5–8.1)

## 2021-09-23 LAB — CBC
HCT: 32.7 % — ABNORMAL LOW (ref 39.0–52.0)
Hemoglobin: 10.4 g/dL — ABNORMAL LOW (ref 13.0–17.0)
MCH: 32.8 pg (ref 26.0–34.0)
MCHC: 31.8 g/dL (ref 30.0–36.0)
MCV: 103.2 fL — ABNORMAL HIGH (ref 80.0–100.0)
Platelets: 180 10*3/uL (ref 150–400)
RBC: 3.17 MIL/uL — ABNORMAL LOW (ref 4.22–5.81)
RDW: 13.4 % (ref 11.5–15.5)
WBC: 7.5 10*3/uL (ref 4.0–10.5)
nRBC: 0 % (ref 0.0–0.2)

## 2021-09-23 LAB — LIPASE, BLOOD: Lipase: 43 U/L (ref 11–51)

## 2021-09-23 NOTE — ED Provider Notes (Signed)
[facility] Provider Note    Event Date/Time   First MD Initiated Contact with Patient 09/23/21 1534     (approximate)   History   Chief Complaint Abdominal Pain   HPI  Lawrence Perkins. is a 81 y.o. male with past medical history of hypertension, CAD, atrial fibrillation on Eliquis, CHF, stroke, MGUS, and ESRD on PD who presents to the ED complaining of abdominal pain.  Patient reports that he had been dealing with significant pain whenever he went to drain his peritoneal dialysis fluid.  He had procedure to reposition his PD catheter 2 days ago to hopefully alleviate this pain and patient states he was initially feeling well up until yesterday evening.  Since then, he has been dealing with pain in the right side of his abdomen whenever he moves, but reports minimal pain when he is sitting still.  He has not noticed any distention of his abdomen and denies any fevers, nausea, vomiting, or changes in bowel movements.  He continues to make urine and denies any dysuria or flank pain.  He was told to resume peritoneal dialysis 48 hours after the procedure, but has been afraid to do so because of the pain.  He spoke with nurse on-call for his nephrologist, who recommended he come to the ED for further evaluation.     Physical Exam   Triage Vital Signs: ED Triage Vitals [09/23/21 1500]  Enc Vitals Group     BP (!) 148/87     Pulse Rate (!) 54     Resp 15     Temp 97.7 F (36.5 C)     Temp Source Oral     SpO2 99 %     Weight 176 lb 12.9 oz (80.2 kg)     Height      Head Circumference      Peak Flow      Pain Score 2     Pain Loc      Pain Edu?      Excl. in [place]?     Most recent vital signs: Vitals:   09/23/21 1500  BP: (!) 148/87  Pulse: (!) 54  Resp: 15  Temp: 97.7 F (36.5 C)  SpO2: 99%    Constitutional: Alert and oriented. Eyes: Conjunctivae are normal. Head: Atraumatic. Nose: No congestion/rhinnorhea. Mouth/Throat: Mucous membranes  are moist.  Cardiovascular: Normal rate, regular rhythm. Grossly normal heart sounds.  2+ radial pulses bilaterally. Respiratory: Normal respiratory effort.  No retractions. Lungs CTAB. Gastrointestinal: Soft and tender to palpation in the right lower quadrant with no rebound or guarding. No distention.  Peritoneal dialysis catheter site clean, dry, and intact with no erythema, warmth, or drainage. Musculoskeletal: No lower extremity tenderness nor edema.  Neurologic:  Normal speech and language. No gross focal neurologic deficits are appreciated.    ED Results / Procedures / Treatments   Labs (all labs ordered are listed, but only abnormal results are displayed) Labs Reviewed  COMPREHENSIVE METABOLIC PANEL - Abnormal; Notable for the following components:      Result Value   BUN 78 (*)    Creatinine, Ser 4.73 (*)    AST 13 (*)    GFR, Estimated 12 (*)    All other components within normal limits  CBC - Abnormal; Notable for the following components:   RBC 3.17 (*)    Hemoglobin 10.4 (*)    HCT 32.7 (*)    MCV 103.2 (*)    All other  components within normal limits  URINALYSIS, ROUTINE W REFLEX MICROSCOPIC - Abnormal; Notable for the following components:   Hgb urine dipstick MODERATE (*)    Protein, ur >300 (*)    Bacteria, UA RARE (*)    All other components within normal limits  LIPASE, BLOOD   RADIOLOGY CT abdomen/pelvis reviewed and interpreted by me, shows intra-abdominal free air as well as small amount of subcutaneous air in the right abdominal wall, no dilated bowel loops or inflammatory changes noted.  PROCEDURES:  Critical Care performed: No  Procedures   MEDICATIONS ORDERED IN ED: Medications - No data to display   IMPRESSION / MDM / [place] / ED COURSE  I reviewed the triage vital signs and the nursing notes.                              81 y.o. male with past medical history of hypertension, CAD, atrial fibrillation on Eliquis, CHF,  stroke, MGUS, and ESRD on PD who presents to the ED with about 24 hours of pain in the right side of his abdomen whenever he goes to move, occurring after he had procedure to reposition his PD catheter.  Patient's presentation is most consistent with acute presentation with potential threat to life or bodily function.  Differential diagnosis includes, but is not limited to, bowel obstruction, appendicitis, UTI, catheter site irritation, SBP.  Patient well-appearing and in no acute distress, vital signs are unremarkable.  He does have tenderness to palpation in the right lower quadrant of his abdomen, PD catheter site is on the left side of his abdomen and appears well with no signs of soft tissue infection.  Labs are reassuring with stable anemia, no significant leukocytosis noted.  BMP consistent with known ESRD, no significant electrolyte abnormality noted, LFTs and lipase are also unremarkable.  Urinalysis shows no signs of infection.  We will further assess with CT imaging, patient declines pain medication at this time.  Low suspicion for SBP as pain is only present with movement and is localized to the right lower quadrant, no other symptoms to suggest infection.  CT of abdomen/pelvis shows small amount of free air in the upper abdomen likely related to presence of PD catheter as well as recent laparoscopic procedure.  There is some mild subcutaneous edema surrounding the catheter as it enters the abdominal wall, but no findings to suggest infection.  He does have subcutaneous abdominal wall air in the right abdomen, which seems to localize to his area of tenderness.  Findings were reviewed with Dr. Dahlia Byes of general surgery, who states this would be expected following procedure to manipulate his PD catheter.  He agrees that patient is appropriate for discharge home with outpatient follow-up, may begin to use catheter for peritoneal dialysis.  He was counseled to return to the ED for new or worsening  symptoms, already has pain medication available at home, patient agrees with plan.      FINAL CLINICAL IMPRESSION(S) / ED DIAGNOSES   Final diagnoses:  Right lower quadrant abdominal pain  Peritoneal dialysis catheter in place [facility])     Rx / DC Orders   ED Discharge Orders     None        Note:  This document was prepared using Dragon voice recognition software and may include unintentional dictation errors.   Blake Divine, MD 09/23/21 [phone]

## 2021-09-23 NOTE — ED Triage Notes (Addendum)
Pt arrives with c/o right sided ABD pain that started yesterday. Pt recently had PD catheter repositioned on the left side. Pt denies bleeding. Pt takes eliquis.

## 2021-09-25 DIAGNOSIS — N186 End stage renal disease: Secondary | ICD-10-CM | POA: Diagnosis not present

## 2021-09-25 DIAGNOSIS — Z992 Dependence on renal dialysis: Secondary | ICD-10-CM | POA: Diagnosis not present

## 2021-09-26 DIAGNOSIS — Z992 Dependence on renal dialysis: Secondary | ICD-10-CM | POA: Diagnosis not present

## 2021-09-26 DIAGNOSIS — N186 End stage renal disease: Secondary | ICD-10-CM | POA: Diagnosis not present

## 2021-09-27 ENCOUNTER — Other Ambulatory Visit: Payer: Medicare HMO

## 2021-09-27 ENCOUNTER — Ambulatory Visit (INDEPENDENT_AMBULATORY_CARE_PROVIDER_SITE_OTHER): Payer: Medicare HMO | Admitting: Surgery

## 2021-09-27 ENCOUNTER — Encounter: Payer: Self-pay | Admitting: Surgery

## 2021-09-27 VITALS — BP 131/77 | HR 81 | Temp 98.0°F | Wt 181.4 lb

## 2021-09-27 DIAGNOSIS — Z992 Dependence on renal dialysis: Secondary | ICD-10-CM

## 2021-09-27 DIAGNOSIS — Z09 Encounter for follow-up examination after completed treatment for conditions other than malignant neoplasm: Secondary | ICD-10-CM

## 2021-09-27 DIAGNOSIS — T85848D Pain due to other internal prosthetic devices, implants and grafts, subsequent encounter: Secondary | ICD-10-CM

## 2021-09-27 DIAGNOSIS — N186 End stage renal disease: Secondary | ICD-10-CM

## 2021-09-27 NOTE — Patient Instructions (Addendum)
If you have any concerns or questions, please feel free to call our office. See follow up appointment below. 

## 2021-09-28 DIAGNOSIS — Z992 Dependence on renal dialysis: Secondary | ICD-10-CM | POA: Diagnosis not present

## 2021-09-28 DIAGNOSIS — N186 End stage renal disease: Secondary | ICD-10-CM | POA: Diagnosis not present

## 2021-09-28 NOTE — Progress Notes (Signed)
Lawrence Perkins is an 81 year old male status post revision of PD catheter 1 week ago.  He did okay but he thinks that he is got some swelling on the groin.  He experienced this after restarting peritoneal dialysis.  He went to the ER and I have personally reviewed the CT scan showing no evidence of hernias or complications.  He is otherwise doing okay He has tried dialysis and it has worked well he experiences no pain  PE: He is in no acute distress, ambulates normally Abdomen: Soft nontender incisions healing well without infection.  On my physical exam there is no evidence of inguinal or ventral hernias.  Catheter has no infection is in place.   A/P currently no evidence of complications.  Swelling in the groin area might be just be normal tracking might be he is normal after restarting PD.  Certainly on exam I cannot feel any hernias nor any are seen on the CT.  I will be happy to see him in about a month.

## 2021-09-29 DIAGNOSIS — Z992 Dependence on renal dialysis: Secondary | ICD-10-CM | POA: Diagnosis not present

## 2021-09-29 DIAGNOSIS — N186 End stage renal disease: Secondary | ICD-10-CM | POA: Diagnosis not present

## 2021-09-30 DIAGNOSIS — Z992 Dependence on renal dialysis: Secondary | ICD-10-CM | POA: Diagnosis not present

## 2021-09-30 DIAGNOSIS — N186 End stage renal disease: Secondary | ICD-10-CM | POA: Diagnosis not present

## 2021-10-01 DIAGNOSIS — N186 End stage renal disease: Secondary | ICD-10-CM | POA: Diagnosis not present

## 2021-10-01 DIAGNOSIS — Z992 Dependence on renal dialysis: Secondary | ICD-10-CM | POA: Diagnosis not present

## 2021-10-02 DIAGNOSIS — Z1159 Encounter for screening for other viral diseases: Secondary | ICD-10-CM | POA: Diagnosis not present

## 2021-10-02 DIAGNOSIS — E785 Hyperlipidemia, unspecified: Secondary | ICD-10-CM | POA: Diagnosis not present

## 2021-10-02 DIAGNOSIS — D649 Anemia, unspecified: Secondary | ICD-10-CM | POA: Diagnosis not present

## 2021-10-02 DIAGNOSIS — N186 End stage renal disease: Secondary | ICD-10-CM | POA: Diagnosis not present

## 2021-10-02 DIAGNOSIS — Z992 Dependence on renal dialysis: Secondary | ICD-10-CM | POA: Diagnosis not present

## 2021-10-03 DIAGNOSIS — N184 Chronic kidney disease, stage 4 (severe): Secondary | ICD-10-CM | POA: Diagnosis not present

## 2021-10-03 DIAGNOSIS — I2510 Atherosclerotic heart disease of native coronary artery without angina pectoris: Secondary | ICD-10-CM | POA: Diagnosis not present

## 2021-10-03 DIAGNOSIS — E7800 Pure hypercholesterolemia, unspecified: Secondary | ICD-10-CM | POA: Diagnosis not present

## 2021-10-03 DIAGNOSIS — R7303 Prediabetes: Secondary | ICD-10-CM | POA: Diagnosis not present

## 2021-10-03 DIAGNOSIS — Z992 Dependence on renal dialysis: Secondary | ICD-10-CM | POA: Diagnosis not present

## 2021-10-03 DIAGNOSIS — D472 Monoclonal gammopathy: Secondary | ICD-10-CM | POA: Diagnosis not present

## 2021-10-03 DIAGNOSIS — I5022 Chronic systolic (congestive) heart failure: Secondary | ICD-10-CM | POA: Diagnosis not present

## 2021-10-03 DIAGNOSIS — Z0000 Encounter for general adult medical examination without abnormal findings: Secondary | ICD-10-CM | POA: Diagnosis not present

## 2021-10-03 DIAGNOSIS — I129 Hypertensive chronic kidney disease with stage 1 through stage 4 chronic kidney disease, or unspecified chronic kidney disease: Secondary | ICD-10-CM | POA: Diagnosis not present

## 2021-10-03 DIAGNOSIS — I4821 Permanent atrial fibrillation: Secondary | ICD-10-CM | POA: Diagnosis not present

## 2021-10-03 DIAGNOSIS — I700 Atherosclerosis of aorta: Secondary | ICD-10-CM | POA: Diagnosis not present

## 2021-10-03 DIAGNOSIS — N186 End stage renal disease: Secondary | ICD-10-CM | POA: Diagnosis not present

## 2021-10-04 ENCOUNTER — Ambulatory Visit: Payer: Medicare HMO | Admitting: Oncology

## 2021-10-04 DIAGNOSIS — K4020 Bilateral inguinal hernia, without obstruction or gangrene, not specified as recurrent: Secondary | ICD-10-CM | POA: Diagnosis not present

## 2021-10-04 DIAGNOSIS — I509 Heart failure, unspecified: Secondary | ICD-10-CM | POA: Insufficient documentation

## 2021-10-04 DIAGNOSIS — N186 End stage renal disease: Secondary | ICD-10-CM | POA: Diagnosis not present

## 2021-10-04 DIAGNOSIS — Z992 Dependence on renal dialysis: Secondary | ICD-10-CM | POA: Diagnosis not present

## 2021-10-04 DIAGNOSIS — Z7901 Long term (current) use of anticoagulants: Secondary | ICD-10-CM | POA: Insufficient documentation

## 2021-10-04 DIAGNOSIS — T85611A Breakdown (mechanical) of intraperitoneal dialysis catheter, initial encounter: Secondary | ICD-10-CM | POA: Insufficient documentation

## 2021-10-04 DIAGNOSIS — T85611S Breakdown (mechanical) of intraperitoneal dialysis catheter, sequela: Secondary | ICD-10-CM | POA: Diagnosis not present

## 2021-10-04 DIAGNOSIS — I120 Hypertensive chronic kidney disease with stage 5 chronic kidney disease or end stage renal disease: Secondary | ICD-10-CM | POA: Diagnosis not present

## 2021-10-05 ENCOUNTER — Encounter: Payer: Medicare HMO | Admitting: Physician Assistant

## 2021-10-05 DIAGNOSIS — Z992 Dependence on renal dialysis: Secondary | ICD-10-CM | POA: Diagnosis not present

## 2021-10-05 DIAGNOSIS — N186 End stage renal disease: Secondary | ICD-10-CM | POA: Diagnosis not present

## 2021-10-06 DIAGNOSIS — Z992 Dependence on renal dialysis: Secondary | ICD-10-CM | POA: Diagnosis not present

## 2021-10-06 DIAGNOSIS — N186 End stage renal disease: Secondary | ICD-10-CM | POA: Diagnosis not present

## 2021-10-09 ENCOUNTER — Encounter (INDEPENDENT_AMBULATORY_CARE_PROVIDER_SITE_OTHER): Payer: Self-pay

## 2021-10-09 ENCOUNTER — Encounter (INDEPENDENT_AMBULATORY_CARE_PROVIDER_SITE_OTHER): Payer: Self-pay | Admitting: Nurse Practitioner

## 2021-10-09 ENCOUNTER — Telehealth (INDEPENDENT_AMBULATORY_CARE_PROVIDER_SITE_OTHER): Payer: Self-pay

## 2021-10-09 ENCOUNTER — Ambulatory Visit (INDEPENDENT_AMBULATORY_CARE_PROVIDER_SITE_OTHER): Payer: Medicare HMO | Admitting: Nurse Practitioner

## 2021-10-09 ENCOUNTER — Other Ambulatory Visit (INDEPENDENT_AMBULATORY_CARE_PROVIDER_SITE_OTHER): Payer: Medicare HMO

## 2021-10-09 VITALS — BP 154/81 | HR 68 | Resp 18 | Ht 70.0 in | Wt 190.0 lb

## 2021-10-09 DIAGNOSIS — N183 Chronic kidney disease, stage 3 unspecified: Secondary | ICD-10-CM

## 2021-10-09 DIAGNOSIS — I129 Hypertensive chronic kidney disease with stage 1 through stage 4 chronic kidney disease, or unspecified chronic kidney disease: Secondary | ICD-10-CM | POA: Diagnosis not present

## 2021-10-09 DIAGNOSIS — Z992 Dependence on renal dialysis: Secondary | ICD-10-CM | POA: Diagnosis not present

## 2021-10-09 DIAGNOSIS — N186 End stage renal disease: Secondary | ICD-10-CM | POA: Diagnosis not present

## 2021-10-09 NOTE — Progress Notes (Signed)
Subjective:    Patient ID: Lawrence Perkins., male    DOB: [birth year], 81 y.o.   MRN: [id number] Chief Complaint  Patient presents with  . Establish Care    Referred By Dr Cherylann Ratel    HPI  Review of Systems     Objective:   Physical Exam  BP (!) 154/81 (BP Location: Left Arm)   Pulse 68   Resp 18   Ht 5\' 10"  (1.778 m)   Wt 190 lb (86.2 kg)   BMI 27.26 kg/m   Past Medical History:  Diagnosis Date  . Anemia   . Aortic atherosclerosis (HCC)   . Arthritis   . Atrial fibrillation and flutter (HCC)    a.) CHA2DS2-VASc = 5 (age x 2, CHF, HTN, aortic plaque). b.) rate/rhythm maintain on oral carvedilol; chronically anticoagulated with dose reduced apixaban  . Bilateral inguinal hernia   . BPH (benign prostatic hyperplasia)   . Cardiac syncope   . Cardiomyopathy (HCC)    a.) TTE 04/23/2016: EF 40%. b.) TTE 09/03/2016: EF 50%. c.) TTE 05/04/2019: EF >55%.  . CHF (congestive heart failure) (HCC)    a.) TTE 04/23/2016: EF 40%; BAE; mild AR/TR, mod MR; G1DD. b.) TTE 09/03/2016: EF 50%; mild LVH; BAE; triv AR/PR, mod MR/TR. c.) TTE 05/04/2019: EF >55%; mod LVH, mild LA enlargement; mild AR/MR/TR  . CKD (chronic kidney disease), stage V (HCC)   . Complication of anesthesia    a.) h/o intra/postoperative A.fib  . Coronary artery disease   . Diverticulosis   . Gout   . History of 2019 novel coronavirus disease (COVID-19) 11/14/2020  . History of colon polyps   . Hypercholesteremia   . Hypertension   . Long term current use of anticoagulant    a.) dose reduced apixaban; dose reduction d/t to age and CKD Dx.  . 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 MGUS (monoclonal gammopathy of unknown significance)   . Pleural effusion   . Rectal bleeding   . Secondary hyperparathyroidism of renal origin [facility])     Social History   Socioeconomic History  . Marital status: Married    Spouse name: Eber Jones  . Number of children: Not on file  . Years of education: Not on file   . Highest education level: Not on file  Occupational History  . Occupation: Retired  Tobacco Use  . Smoking status: Former    Years: 15.00    Types: Cigarettes    Quit date: 01/01/1981    Years since quitting: 40.7  . Smokeless tobacco: Never  Vaping Use  . Vaping Use: Never used  Substance and Sexual Activity  . Alcohol use: Not Currently  . Drug use: No  . Sexual activity: Yes    Birth control/protection: None  Other Topics Concern  . Not on file  Social History Narrative   Regular exercise: Yes   Social Determinants of Health   Financial Resource Strain: Not on file  Food Insecurity: Not on file  Transportation Needs: Not on file  Physical Activity: Not on file  Stress: Not on file  Social Connections: Not on file  Intimate Partner Violence: Not on file    Past Surgical History:  Procedure Laterality Date  . ANKLE SURGERY Right 1985   following volleyball injury  . APPENDECTOMY     at the of 9 yrs  . CAPD INSERTION N/A 08/10/2021   Procedure: LAPAROSCOPIC INSERTION CONTINUOUS AMBULATORY PERITONEAL DIALYSIS  (CAPD) CATHETER;  Surgeon: Leafy Ro, MD;  Location: [facility] ORS;  Service: General;  Laterality: N/A;  . CAPD REVISION N/A 09/21/2021   Procedure: LAPAROSCOPIC REVISION CONTINUOUS AMBULATORY PERITONEAL DIALYSIS  (CAPD) CATHETER;  Surgeon: Leafy Ro, MD;  Location: [facility] ORS;  Service: General;  Laterality: N/A;  . COLONOSCOPY    . COLONOSCOPY WITH PROPOFOL N/A 01/02/2017   Procedure: COLONOSCOPY WITH PROPOFOL;  Surgeon: Scot Jun, MD;  Location: [facility] ENDOSCOPY;  Service: Endoscopy;  Laterality: N/A;  . DIALYSIS/PERMA CATHETER INSERTION N/A 03/27/2021   Procedure: DIALYSIS/PERMA CATHETER INSERTION;  Surgeon: Annice Needy, MD;  Location: [facility] INVASIVE CV LAB;  Service: Cardiovascular;  Laterality: N/A;  . DIALYSIS/PERMA CATHETER REMOVAL N/A 05/08/2021   Procedure: DIALYSIS/PERMA CATHETER REMOVAL;  Surgeon: Annice Needy, MD;  Location: [facility] INVASIVE  CV LAB;  Service: Cardiovascular;  Laterality: N/A;  . INSERTION OF MESH  08/10/2021   Procedure: INSERTION OF MESH;  Surgeon: Leafy Ro, MD;  Location: [facility] ORS;  Service: General;;  . JOINT REPLACEMENT     RIGHT TOTAL HIP  . KNEE ARTHROSCOPY WITH LATERAL RELEASE Right 04/20/2021   Procedure: Right knee medial retinaculum and lateral release with polyethylene exchange;  Surgeon: Kennedy Bucker, MD;  Location: [facility] ORS;  Service: Orthopedics;  Laterality: Right;  . TONSILLECTOMY     as a child  . TOTAL HIP ARTHROPLASTY  09/2009   Right femoral neck fracture  . TOTAL HIP ARTHROPLASTY Left 03/06/2016   Procedure: TOTAL HIP ARTHROPLASTY ANTERIOR APPROACH;  Surgeon: Kennedy Bucker, MD;  Location: [facility] ORS;  Service: Orthopedics;  Laterality: Left;  . TOTAL KNEE ARTHROPLASTY Right 03/21/2021   Procedure: TOTAL KNEE ARTHROPLASTY;  Surgeon: Kennedy Bucker, MD;  Location: [facility] ORS;  Service: Orthopedics;  Laterality: Right;  . UMBILICAL HERNIA REPAIR  08/10/2021   Procedure: HERNIA REPAIR UMBILICAL ADULT;  Surgeon: Leafy Ro, MD;  Location: [facility] ORS;  Service: General;;    Family History  Problem Relation Age of Onset  . Cirrhosis Mother   . Other Father        Lung Fibrosis  . Prostate cancer Neg Hx   . Kidney cancer Neg Hx   . Bladder Cancer Neg Hx     No Known Allergies     Latest Ref Rng & Units 09/23/2021    3:02 PM 09/21/2021    8:14 AM 08/10/2021    9:26 AM  CBC  WBC 4.0 - 10.5 K/uL 7.5     Hemoglobin 13.0 - 17.0 g/dL 16.1  09.6  04.5   Hematocrit 39.0 - 52.0 % 32.7  32.0  32.0   Platelets 150 - 400 K/uL 180         CMP     Component Value Date/Time   NA 140 09/23/2021 1502   NA 139 02/04/2014 1449   K 4.4 09/23/2021 1502   K 4.6 02/04/2014 1449   CL 104 09/23/2021 1502   CL 105 02/04/2014 1449   CO2 26 09/23/2021 1502   CO2 27 02/04/2014 1449   GLUCOSE 91 09/23/2021 1502   GLUCOSE 84 02/04/2014 1449   BUN 78 (H) 09/23/2021 1502   BUN 23 (H) 02/04/2014 1449    CREATININE 4.73 (H) 09/23/2021 1502   CREATININE 1.25 02/04/2014 1449   CALCIUM 9.2 09/23/2021 1502   CALCIUM 8.7 02/04/2014 1449   PROT 6.8 09/23/2021 1502   ALBUMIN 3.8 09/23/2021 1502   AST 13 (L) 09/23/2021 1502   ALT 7 09/23/2021 1502   ALKPHOS 63 09/23/2021  1502   BILITOT 0.6 09/23/2021 1502   GFRNONAA 12 (L) 09/23/2021 1502   GFRNONAA >60 02/04/2014 1449   GFRAA 22 (L) 09/15/2019 0856   GFRAA >60 02/04/2014 1449     No results found.     Assessment & Plan:   1. ESRD on dialysis [facility]) The patient will have his PD catheter revised as well as his hernia.  On Wednesday.  Prior to this the patient will need a hemodialysis access for dialysis.  Based on this the patient should have a PermCath placed.  I have discussed the risk benefits and alternatives with the patient.  He is agreeable to proceed.  We also discussed that he may wish to consider a more permanent access such as an upper extremity fistula.  It is not uncommon for those with peritoneal dialysis cath liters to have fistula is in place or time such as these and they are not requiring use of a PD catheter.  Following discussion the patient does not wish to move forward with vein mapping but may consider at a later date and time.  2. Benign hypertension with chronic kidney disease, stage III (HCC) Continue antihypertensive medications as already ordered, these medications have been reviewed and there are no changes at this time.    Current Outpatient Medications on File Prior to Visit  Medication Sig Dispense Refill  . apixaban (ELIQUIS) 2.5 MG TABS tablet Take 2.5 mg by mouth 2 (two) times daily.    . B Complex Vitamins (B-COMPLEX/B-12 PO) Take 1,000 mcg by mouth.    . calcitRIOL (ROCALTROL) 0.25 MCG capsule Take 0.25 mcg by mouth daily.    . carvedilol (COREG) 3.125 MG tablet Take 3.125 mg by mouth 2 (two) times daily with a meal.     . cholecalciferol (VITAMIN D3) 25 MCG (1000 UNIT) tablet Take 1,000 Units by mouth  daily.    . Multiple Vitamin (MULTIVITAMIN WITH MINERALS) TABS tablet Take 1 tablet by mouth daily.    . rosuvastatin (CRESTOR) 10 MG tablet Take 10 mg by mouth at bedtime.    . senna-docusate (SENOKOT-S) 8.6-50 MG tablet Take 1 tablet by mouth daily.    . tamsulosin (FLOMAX) 0.4 MG CAPS capsule Take 1 capsule (0.4 mg total) by mouth daily. 90 capsule 3  . torsemide (DEMADEX) 20 MG tablet Take 2 tablets (40 mg total) by mouth daily. 60 tablet 0  . traZODone (DESYREL) 50 MG tablet Take by mouth.     No current facility-administered medications on file prior to visit.    There are no Patient Instructions on file for this visit. No follow-ups on file.   Georgiana Spinner, NP

## 2021-10-09 NOTE — H&P (View-Only) (Signed)
Subjective:    Patient ID: Lawrence Song., male    DOB: 1940/05/02, 81 y.o.   MRN: [id number] Chief Complaint  Patient presents with   Establish Care    Referred By Dr Franklyn Lor. is an 81 year old male referred by Dr. Zollie Scale for CVC catheter placement.  The patient notes that he is currently maintained via peritoneal dialysis catheter but recently began to have issues and began to experience scrotal swelling.  Evaluation found the patient had a hernia and required repair of this in addition to revision of his peritoneal dialysis catheter.  Because of this the patient will need to be on hemodialysis for 4 to 6 weeks while he heals.  The patient has previously had a right chest PermCath that was removed after short amount of time after it was found he did not need to be on hemodialysis following an acute kidney injury.  However several months later he ultimately required dialysis again and ended up on peritoneal dialysis.    Review of Systems  Genitourinary:  Positive for scrotal swelling.  All other systems reviewed and are negative.      Objective:   Physical Exam Vitals reviewed.  HENT:     Head: Normocephalic.  Cardiovascular:     Rate and Rhythm: Normal rate.  Pulmonary:     Effort: Pulmonary effort is normal.  Skin:    General: Skin is warm and dry.  Neurological:     Mental Status: He is alert and oriented to person, place, and time.  Psychiatric:        Mood and Affect: Mood normal.        Behavior: Behavior normal.        Thought Content: Thought content normal.        Judgment: Judgment normal.     BP (!) 154/81 (BP Location: Left Arm)   Pulse 68   Resp 18   Ht '5\' 10"'$  (1.778 m)   Wt 190 lb (86.2 kg)   BMI 27.26 kg/m   Past Medical History:  Diagnosis Date   Anemia    Aortic atherosclerosis (HCC)    Arthritis    Atrial fibrillation and flutter (HCC)    a.) CHA2DS2-VASc = 5 (age x 2, CHF, HTN, aortic plaque). b.) rate/rhythm maintain  on oral carvedilol; chronically anticoagulated with dose reduced apixaban   Bilateral inguinal hernia    BPH (benign prostatic hyperplasia)    Cardiac syncope    Cardiomyopathy ([place])    a.) TTE 04/23/2016: EF 40%. b.) TTE 09/03/2016: EF 50%. c.) TTE 05/04/2019: EF >55%.   CHF (congestive heart failure) ([place])    a.) TTE 04/23/2016: EF 40%; BAE; mild AR/TR, mod MR; G1DD. b.) TTE 09/03/2016: EF 50%; mild LVH; BAE; triv AR/PR, mod MR/TR. c.) TTE 05/04/2019: EF >55%; mod LVH, mild LA enlargement; mild AR/MR/TR   CKD (chronic kidney disease), stage V (HCC)    Complication of anesthesia    a.) h/o intra/postoperative A.fib   Coronary artery disease    Diverticulosis    Gout    History of 2019 novel coronavirus disease (COVID-19) 11/14/2020   History of colon polyps    Hypercholesteremia    Hypertension    Long term current use of anticoagulant    a.) dose reduced apixaban; dose reduction d/t to age and CKD Dx.   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[facility])    a.) s/p resection   MGUS (monoclonal gammopathy of unknown significance)  Pleural effusion    Rectal bleeding    Secondary hyperparathyroidism of renal origin [facility])     Social History   Socioeconomic History   Marital status: Married    Spouse name: [name]   Number of children: Not on file   Years of education: Not on file   Highest education level: Not on file  Occupational History   Occupation: Retired  Tobacco Use   Smoking status: Former    Years: 15.00    Types: Cigarettes    Quit date: 01/01/1981    Years since quitting: 40.7   Smokeless tobacco: Never  Vaping Use   Vaping Use: Never used  Substance and Sexual Activity   Alcohol use: Not Currently   Drug use: No   Sexual activity: Yes    Birth control/protection: None  Other Topics Concern   Not on file  Social History Narrative   Regular exercise: Yes   Social Determinants of Health   Financial Resource Strain: Not on file  Food Insecurity: Not on file  Transportation  Needs: Not on file  Physical Activity: Not on file  Stress: Not on file  Social Connections: Not on file  Intimate Partner Violence: Not on file    Past Surgical History:  Procedure Laterality Date   ANKLE SURGERY Right 1985   following volleyball injury   APPENDECTOMY     at the of 9 yrs   CAPD INSERTION N/A 08/10/2021   Procedure: LAPAROSCOPIC INSERTION CONTINUOUS AMBULATORY PERITONEAL DIALYSIS  (CAPD) CATHETER;  Surgeon: Jules Husbands, MD;  Location: [facility] ORS;  Service: General;  Laterality: N/A;   CAPD REVISION N/A 09/21/2021   Procedure: LAPAROSCOPIC REVISION CONTINUOUS AMBULATORY PERITONEAL DIALYSIS  (CAPD) CATHETER;  Surgeon: Jules Husbands, MD;  Location: [facility] ORS;  Service: General;  Laterality: N/A;   COLONOSCOPY     COLONOSCOPY WITH PROPOFOL N/A 01/02/2017   Procedure: COLONOSCOPY WITH PROPOFOL;  Surgeon: Manya Silvas, MD;  Location: [facility] ENDOSCOPY;  Service: Endoscopy;  Laterality: N/A;   DIALYSIS/PERMA CATHETER INSERTION N/A 03/27/2021   Procedure: DIALYSIS/PERMA CATHETER INSERTION;  Surgeon: Algernon Huxley, MD;  Location: [place] CV LAB;  Service: Cardiovascular;  Laterality: N/A;   DIALYSIS/PERMA CATHETER REMOVAL N/A 05/08/2021   Procedure: DIALYSIS/PERMA CATHETER REMOVAL;  Surgeon: Algernon Huxley, MD;  Location: [place] CV LAB;  Service: Cardiovascular;  Laterality: N/A;   INSERTION OF MESH  08/10/2021   Procedure: INSERTION OF MESH;  Surgeon: Jules Husbands, MD;  Location: [facility] ORS;  Service: General;;   JOINT REPLACEMENT     RIGHT TOTAL HIP   KNEE ARTHROSCOPY WITH LATERAL RELEASE Right 04/20/2021   Procedure: Right knee medial retinaculum and lateral release with polyethylene exchange;  Surgeon: Hessie Knows, MD;  Location: [facility] ORS;  Service: Orthopedics;  Laterality: Right;   TONSILLECTOMY     as a child   TOTAL HIP ARTHROPLASTY  09/2009   Right femoral neck fracture   TOTAL HIP ARTHROPLASTY Left 03/06/2016   Procedure: TOTAL HIP ARTHROPLASTY  ANTERIOR APPROACH;  Surgeon: Hessie Knows, MD;  Location: [facility] ORS;  Service: Orthopedics;  Laterality: Left;   TOTAL KNEE ARTHROPLASTY Right 03/21/2021   Procedure: TOTAL KNEE ARTHROPLASTY;  Surgeon: Hessie Knows, MD;  Location: [facility] ORS;  Service: Orthopedics;  Laterality: Right;   UMBILICAL HERNIA REPAIR  08/10/2021   Procedure: HERNIA REPAIR UMBILICAL ADULT;  Surgeon: Jules Husbands, MD;  Location: [facility] ORS;  Service: General;;    Family History  Problem Relation Age of Onset  Cirrhosis Mother    Other Father        Lung Fibrosis   Prostate cancer Neg Hx    Kidney cancer Neg Hx    Bladder Cancer Neg Hx     No Known Allergies     Latest Ref Rng & Units 09/23/2021    3:02 PM 09/21/2021    8:14 AM 08/10/2021    9:26 AM  CBC  WBC 4.0 - 10.5 K/uL 7.5     Hemoglobin 13.0 - 17.0 g/dL 10.4  10.9  10.9   Hematocrit 39.0 - 52.0 % 32.7  32.0  32.0   Platelets 150 - 400 K/uL 180         CMP     Component Value Date/Time   NA 140 09/23/2021 1502   NA 139 02/04/2014 1449   K 4.4 09/23/2021 1502   K 4.6 02/04/2014 1449   CL 104 09/23/2021 1502   CL 105 02/04/2014 1449   CO2 26 09/23/2021 1502   CO2 27 02/04/2014 1449   GLUCOSE 91 09/23/2021 1502   GLUCOSE 84 02/04/2014 1449   BUN 78 (H) 09/23/2021 1502   BUN 23 (H) 02/04/2014 1449   CREATININE 4.73 (H) 09/23/2021 1502   CREATININE 1.25 02/04/2014 1449   CALCIUM 9.2 09/23/2021 1502   CALCIUM 8.7 02/04/2014 1449   PROT 6.8 09/23/2021 1502   ALBUMIN 3.8 09/23/2021 1502   AST 13 (L) 09/23/2021 1502   ALT 7 09/23/2021 1502   ALKPHOS 63 09/23/2021 1502   BILITOT 0.6 09/23/2021 1502   GFRNONAA 12 (L) 09/23/2021 1502   GFRNONAA >60 02/04/2014 1449   GFRAA 22 (L) 09/15/2019 0856   GFRAA >60 02/04/2014 1449     No results found.     Assessment & Plan:   1. ESRD on dialysis [facility]) The patient will have his PD catheter revised as well as his hernia.  On Wednesday.  Prior to this the patient will need a hemodialysis access  for dialysis.  Based on this the patient should have a PermCath placed.  I have discussed the risk benefits and alternatives with the patient.  He is agreeable to proceed.  We also discussed that he may wish to consider a more permanent access such as an upper extremity fistula.  It is not uncommon for those with peritoneal dialysis cath liters to have fistula is in place or time such as these and they are not requiring use of a PD catheter.  Following discussion the patient does not wish to move forward with vein mapping but may consider at a later date and time.  2. Benign hypertension with chronic kidney disease, stage III (HCC) Continue antihypertensive medications as already ordered, these medications have been reviewed and there are no changes at this time.    Current Outpatient Medications on File Prior to Visit  Medication Sig Dispense Refill   apixaban (ELIQUIS) 2.5 MG TABS tablet Take 2.5 mg by mouth 2 (two) times daily.     B Complex Vitamins (B-COMPLEX/B-12 PO) Take 1,000 mcg by mouth.     calcitRIOL (ROCALTROL) 0.25 MCG capsule Take 0.25 mcg by mouth daily.     carvedilol (COREG) 3.125 MG tablet Take 3.125 mg by mouth 2 (two) times daily with a meal.      cholecalciferol (VITAMIN D3) 25 MCG (1000 UNIT) tablet Take 1,000 Units by mouth daily.     Multiple Vitamin (MULTIVITAMIN WITH MINERALS) TABS tablet Take 1 tablet by mouth daily.  rosuvastatin (CRESTOR) 10 MG tablet Take 10 mg by mouth at bedtime.     senna-docusate (SENOKOT-S) 8.6-50 MG tablet Take 1 tablet by mouth daily.     tamsulosin (FLOMAX) 0.4 MG CAPS capsule Take 1 capsule (0.4 mg total) by mouth daily. 90 capsule 3   torsemide (DEMADEX) 20 MG tablet Take 2 tablets (40 mg total) by mouth daily. 60 tablet 0   traZODone (DESYREL) 50 MG tablet Take by mouth.     No current facility-administered medications on file prior to visit.    There are no Patient Instructions on file for this visit. No follow-ups on  file.   Kris Hartmann, NP

## 2021-10-09 NOTE — Progress Notes (Incomplete)
Subjective:    Patient ID: Lawrence Perkins., male    DOB: 08/23/1940, 81 y.o.   MRN: [id number] Chief Complaint  Patient presents with  . Establish Care    Referred By Dr Franklyn Lor.     Review of Systems  Genitourinary:  Positive for scrotal swelling.  All other systems reviewed and are negative.      Objective:   Physical Exam Vitals reviewed.  HENT:     Head: Normocephalic.  Cardiovascular:     Rate and Rhythm: Normal rate.  Pulmonary:     Effort: Pulmonary effort is normal.  Skin:    General: Skin is warm and dry.  Neurological:     Mental Status: He is alert and oriented to person, place, and time.  Psychiatric:        Mood and Affect: Mood normal.        Behavior: Behavior normal.        Thought Content: Thought content normal.        Judgment: Judgment normal.     BP (!) 154/81 (BP Location: Left Arm)   Pulse 68   Resp 18   Ht '5\' 10"'$  (1.778 m)   Wt 190 lb (86.2 kg)   BMI 27.26 kg/m   Past Medical History:  Diagnosis Date  . Anemia   . Aortic atherosclerosis ([place])   . Arthritis   . Atrial fibrillation and flutter (HCC)    a.) CHA2DS2-VASc = 5 (age x 2, CHF, HTN, aortic plaque). b.) rate/rhythm maintain on oral carvedilol; chronically anticoagulated with dose reduced apixaban  . Bilateral inguinal hernia   . BPH (benign prostatic hyperplasia)   . Cardiac syncope   . Cardiomyopathy ([place])    a.) TTE 04/23/2016: EF 40%. b.) TTE 09/03/2016: EF 50%. c.) TTE 05/04/2019: EF >55%.  . CHF (congestive heart failure) ([place])    a.) TTE 04/23/2016: EF 40%; BAE; mild AR/TR, mod MR; G1DD. b.) TTE 09/03/2016: EF 50%; mild LVH; BAE; triv AR/PR, mod MR/TR. c.) TTE 05/04/2019: EF >55%; mod LVH, mild LA enlargement; mild AR/MR/TR  . CKD (chronic kidney disease), stage V ([place])   . Complication of anesthesia    a.) h/o intra/postoperative A.fib  . Coronary artery disease   . Diverticulosis   . Gout   . History of 2019 novel coronavirus disease  (COVID-19) 11/14/2020  . History of colon polyps   . Hypercholesteremia   . Hypertension   . Long term current use of anticoagulant    a.) dose reduced apixaban; dose reduction d/t to age and CKD Dx.  . 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 MGUS (monoclonal gammopathy of unknown significance)   . Pleural effusion   . Rectal bleeding   . Secondary hyperparathyroidism of renal origin [facility])     Social History   Socioeconomic History  . Marital status: Married    Spouse name: [name]  . Number of children: Not on file  . Years of education: Not on file  . Highest education level: Not on file  Occupational History  . Occupation: Retired  Tobacco Use  . Smoking status: Former    Years: 15.00    Types: Cigarettes    Quit date: 01/01/1981    Years since quitting: 40.7  . Smokeless tobacco: Never  Vaping Use  . Vaping Use: Never used  Substance and Sexual Activity  . Alcohol use: Not Currently  . Drug use: No  .  Sexual activity: Yes    Birth control/protection: None  Other Topics Concern  . Not on file  Social History Narrative   Regular exercise: Yes   Social Determinants of Health   Financial Resource Strain: Not on file  Food Insecurity: Not on file  Transportation Needs: Not on file  Physical Activity: Not on file  Stress: Not on file  Social Connections: Not on file  Intimate Partner Violence: Not on file    Past Surgical History:  Procedure Laterality Date  . ANKLE SURGERY Right 1985   following volleyball injury  . APPENDECTOMY     at the of 9 yrs  . CAPD INSERTION N/A 08/10/2021   Procedure: LAPAROSCOPIC INSERTION CONTINUOUS AMBULATORY PERITONEAL DIALYSIS  (CAPD) CATHETER;  Surgeon: Jules Husbands, MD;  Location: [facility] ORS;  Service: General;  Laterality: N/A;  . CAPD REVISION N/A 09/21/2021   Procedure: LAPAROSCOPIC REVISION CONTINUOUS AMBULATORY PERITONEAL DIALYSIS  (CAPD) CATHETER;  Surgeon: Jules Husbands, MD;  Location: [facility] ORS;  Service:  General;  Laterality: N/A;  . COLONOSCOPY    . COLONOSCOPY WITH PROPOFOL N/A 01/02/2017   Procedure: COLONOSCOPY WITH PROPOFOL;  Surgeon: Manya Silvas, MD;  Location: [facility] ENDOSCOPY;  Service: Endoscopy;  Laterality: N/A;  . DIALYSIS/PERMA CATHETER INSERTION N/A 03/27/2021   Procedure: DIALYSIS/PERMA CATHETER INSERTION;  Surgeon: Algernon Huxley, MD;  Location: [place] CV LAB;  Service: Cardiovascular;  Laterality: N/A;  . DIALYSIS/PERMA CATHETER REMOVAL N/A 05/08/2021   Procedure: DIALYSIS/PERMA CATHETER REMOVAL;  Surgeon: Algernon Huxley, MD;  Location: [place] [facility];  Service: Cardiovascular;  Laterality: N/A;  . INSERTION OF MESH  08/10/2021   Procedure: INSERTION OF MESH;  Surgeon: Jules Husbands, MD;  Location: [facility] ORS;  Service: General;;  . JOINT REPLACEMENT     RIGHT TOTAL HIP  . KNEE ARTHROSCOPY WITH LATERAL RELEASE Right 04/20/2021   Procedure: Right knee medial retinaculum and lateral release with polyethylene exchange;  Surgeon: Hessie Knows, MD;  Location: [facility] ORS;  Service: Orthopedics;  Laterality: Right;  . TONSILLECTOMY     as a child  . TOTAL HIP ARTHROPLASTY  09/2009   Right femoral neck fracture  . TOTAL HIP ARTHROPLASTY Left 03/06/2016   Procedure: TOTAL HIP ARTHROPLASTY ANTERIOR APPROACH;  Surgeon: Hessie Knows, MD;  Location: [facility] ORS;  Service: Orthopedics;  Laterality: Left;  . TOTAL KNEE ARTHROPLASTY Right 03/21/2021   Procedure: TOTAL KNEE ARTHROPLASTY;  Surgeon: Hessie Knows, MD;  Location: [facility] ORS;  Service: Orthopedics;  Laterality: Right;  . UMBILICAL HERNIA REPAIR  08/10/2021   Procedure: HERNIA REPAIR UMBILICAL ADULT;  Surgeon: Jules Husbands, MD;  Location: [facility] ORS;  Service: General;;    Family History  Problem Relation Age of Onset  . Cirrhosis Mother   . Other Father        Lung Fibrosis  . Prostate cancer Neg Hx   . Kidney cancer Neg Hx   . Bladder Cancer Neg Hx     No Known Allergies     Latest Ref Rng & Units 09/23/2021     3:02 PM 09/21/2021    8:14 AM 08/10/2021    9:26 AM  CBC  WBC 4.0 - 10.5 K/uL 7.5     Hemoglobin 13.0 - 17.0 g/dL 10.4  10.9  10.9   Hematocrit 39.0 - 52.0 % 32.7  32.0  32.0   Platelets 150 - 400 K/uL 180         CMP     Component Value Date/Time  NA 140 09/23/2021 1502   NA 139 02/04/2014 1449   K 4.4 09/23/2021 1502   K 4.6 02/04/2014 1449   CL 104 09/23/2021 1502   CL 105 02/04/2014 1449   CO2 26 09/23/2021 1502   CO2 27 02/04/2014 1449   GLUCOSE 91 09/23/2021 1502   GLUCOSE 84 02/04/2014 1449   BUN 78 (H) 09/23/2021 1502   BUN 23 (H) 02/04/2014 1449   CREATININE 4.73 (H) 09/23/2021 1502   CREATININE 1.25 02/04/2014 1449   CALCIUM 9.2 09/23/2021 1502   CALCIUM 8.7 02/04/2014 1449   PROT 6.8 09/23/2021 1502   ALBUMIN 3.8 09/23/2021 1502   AST 13 (L) 09/23/2021 1502   ALT 7 09/23/2021 1502   ALKPHOS 63 09/23/2021 1502   BILITOT 0.6 09/23/2021 1502   GFRNONAA 12 (L) 09/23/2021 1502   GFRNONAA >60 02/04/2014 1449   GFRAA 22 (L) 09/15/2019 0856   GFRAA >60 02/04/2014 1449     No results found.     Assessment & Plan:   1. ESRD on dialysis [facility]) The patient will have his PD catheter revised as well as his hernia.  On Wednesday.  Prior to this the patient will need a hemodialysis access for dialysis.  Based on this the patient should have a PermCath placed.  I have discussed the risk benefits and alternatives with the patient.  He is agreeable to proceed.  We also discussed that he may wish to consider a more permanent access such as an upper extremity fistula.  It is not uncommon for those with peritoneal dialysis cath liters to have fistula is in place or time such as these and they are not requiring use of a PD catheter.  Following discussion the patient does not wish to move forward with vein mapping but may consider at a later date and time.  2. Benign hypertension with chronic kidney disease, stage III (HCC) Continue antihypertensive medications as already  ordered, these medications have been reviewed and there are no changes at this time.    Current Outpatient Medications on File Prior to Visit  Medication Sig Dispense Refill  . apixaban (ELIQUIS) 2.5 MG TABS tablet Take 2.5 mg by mouth 2 (two) times daily.    . B Complex Vitamins (B-COMPLEX/B-12 PO) Take 1,000 mcg by mouth.    . calcitRIOL (ROCALTROL) 0.25 MCG capsule Take 0.25 mcg by mouth daily.    . carvedilol (COREG) 3.125 MG tablet Take 3.125 mg by mouth 2 (two) times daily with a meal.     . cholecalciferol (VITAMIN D3) 25 MCG (1000 UNIT) tablet Take 1,000 Units by mouth daily.    . Multiple Vitamin (MULTIVITAMIN WITH MINERALS) TABS tablet Take 1 tablet by mouth daily.    . rosuvastatin (CRESTOR) 10 MG tablet Take 10 mg by mouth at bedtime.    . senna-docusate (SENOKOT-S) 8.6-50 MG tablet Take 1 tablet by mouth daily.    . tamsulosin (FLOMAX) 0.4 MG CAPS capsule Take 1 capsule (0.4 mg total) by mouth daily. 90 capsule 3  . torsemide (DEMADEX) 20 MG tablet Take 2 tablets (40 mg total) by mouth daily. 60 tablet 0  . traZODone (DESYREL) 50 MG tablet Take by mouth.     No current facility-administered medications on file prior to visit.    There are no Patient Instructions on file for this visit. No follow-ups on file.   Kris Hartmann, NP

## 2021-10-09 NOTE — Telephone Encounter (Signed)
Patient was seen in office and is scheduled with Dr. Delana Meyer for a permcath placement on 10/10/21 with a 2:00 pm arrival time to the MM. Pre-procedure instructions were discussed and handed to the patient.

## 2021-10-10 ENCOUNTER — Ambulatory Visit
Admission: RE | Admit: 2021-10-10 | Discharge: 2021-10-10 | Disposition: A | Discharge status: Home / Self Care | Payer: Medicare HMO | Source: Ambulatory Visit | Attending: Vascular Surgery | Admitting: Vascular Surgery

## 2021-10-10 ENCOUNTER — Encounter: Payer: Self-pay | Admitting: Vascular Surgery

## 2021-10-10 ENCOUNTER — Other Ambulatory Visit: Payer: Self-pay

## 2021-10-10 ENCOUNTER — Encounter
Admission: RE | Disposition: A | Discharge status: Home / Self Care | Payer: Self-pay | Source: Ambulatory Visit | Attending: Vascular Surgery

## 2021-10-10 DIAGNOSIS — Z87891 Personal history of nicotine dependence: Secondary | ICD-10-CM | POA: Diagnosis not present

## 2021-10-10 DIAGNOSIS — Z992 Dependence on renal dialysis: Secondary | ICD-10-CM | POA: Diagnosis not present

## 2021-10-10 DIAGNOSIS — I120 Hypertensive chronic kidney disease with stage 5 chronic kidney disease or end stage renal disease: Secondary | ICD-10-CM | POA: Insufficient documentation

## 2021-10-10 DIAGNOSIS — N186 End stage renal disease: Secondary | ICD-10-CM | POA: Insufficient documentation

## 2021-10-10 HISTORY — PX: DIALYSIS/PERMA CATHETER INSERTION: CATH118288

## 2021-10-10 LAB — POTASSIUM (ARMC VASCULAR LAB ONLY): Potassium (ARMC vascular lab): 4 mmol/L (ref 3.5–5.1)

## 2021-10-10 SURGERY — DIALYSIS/PERMA CATHETER INSERTION
Anesthesia: Moderate Sedation

## 2021-10-10 MED ORDER — METHYLPREDNISOLONE SODIUM SUCC 125 MG IJ SOLR: 125.0000 mg | Freq: Once | INTRAMUSCULAR | Status: DC | PRN

## 2021-10-10 MED ORDER — CEFAZOLIN SODIUM-DEXTROSE 1-4 GM/50ML-% IV SOLN
INTRAVENOUS | Status: AC
  Filled 2021-10-10: qty 50

## 2021-10-10 MED ORDER — HYDRALAZINE HCL 20 MG/ML IJ SOLN
INTRAMUSCULAR | Status: AC
  Administered 2021-10-10: 10 mg via INTRAVENOUS
  Filled 2021-10-10: qty 1

## 2021-10-10 MED ORDER — SODIUM CHLORIDE 0.9 % IV SOLN: INTRAVENOUS | Status: DC

## 2021-10-10 MED ORDER — CEFAZOLIN SODIUM-DEXTROSE 1-4 GM/50ML-% IV SOLN: 1.0000 g | INTRAVENOUS | Status: DC

## 2021-10-10 MED ORDER — HEPARIN SODIUM (PORCINE) 10000 UNIT/ML IJ SOLN
INTRAMUSCULAR | Status: AC
  Filled 2021-10-10: qty 1

## 2021-10-10 MED ORDER — FENTANYL CITRATE PF 50 MCG/ML IJ SOSY
PREFILLED_SYRINGE | INTRAMUSCULAR | Status: AC
  Filled 2021-10-10: qty 1

## 2021-10-10 MED ORDER — MIDAZOLAM HCL 2 MG/ML PO SYRP: 8.0000 mg | ORAL_SOLUTION | Freq: Once | ORAL | Status: DC | PRN

## 2021-10-10 MED ORDER — HYDROMORPHONE HCL 1 MG/ML IJ SOLN: 1.0000 mg | Freq: Once | INTRAMUSCULAR | Status: DC | PRN

## 2021-10-10 MED ORDER — FAMOTIDINE 20 MG PO TABS: 40.0000 mg | ORAL_TABLET | Freq: Once | ORAL | Status: DC | PRN

## 2021-10-10 MED ORDER — HYDRALAZINE HCL 20 MG/ML IJ SOLN: 10.0000 mg | Freq: Once | INTRAMUSCULAR | Status: AC

## 2021-10-10 MED ORDER — FENTANYL CITRATE (PF) 100 MCG/2ML IJ SOLN
INTRAMUSCULAR | Status: DC | PRN
  Administered 2021-10-10 (×2): 50 ug via INTRAVENOUS

## 2021-10-10 MED ORDER — MIDAZOLAM HCL 5 MG/5ML IJ SOLN
INTRAMUSCULAR | Status: AC
  Filled 2021-10-10: qty 5

## 2021-10-10 MED ORDER — MIDAZOLAM HCL 2 MG/2ML IJ SOLN
INTRAMUSCULAR | Status: DC | PRN
  Administered 2021-10-10: 2 mg via INTRAVENOUS
  Administered 2021-10-10: 1 mg via INTRAVENOUS

## 2021-10-10 MED ORDER — DIPHENHYDRAMINE HCL 50 MG/ML IJ SOLN: 50.0000 mg | Freq: Once | INTRAMUSCULAR | Status: DC | PRN

## 2021-10-10 MED ORDER — ONDANSETRON HCL 4 MG/2ML IJ SOLN: 4.0000 mg | Freq: Four times a day (QID) | INTRAMUSCULAR | Status: DC | PRN

## 2021-10-10 SURGICAL SUPPLY — 14 items
ADH SKN CLS APL DERMABOND .7 (GAUZE/BANDAGES/DRESSINGS) ×1
CATH PALINDROME-P 19CM W/VT (CATHETERS) IMPLANT
CATH PALINDROME-SP 14.5FX23 (CATHETERS) IMPLANT
COVER PROBE ULTRASOUND 5X96 (MISCELLANEOUS) IMPLANT
DERMABOND ADVANCED .7 DNX12 (GAUZE/BANDAGES/DRESSINGS) IMPLANT
DRAPE INCISE 23X17 IOBAN STRL (DRAPES) ×1
DRAPE INCISE 23X17 STRL (DRAPES) IMPLANT
DRAPE INCISE IOBAN 23X17 STRL (DRAPES) ×1 IMPLANT
NDL ENTRY 21GA 7CM ECHOTIP (NEEDLE) IMPLANT
NEEDLE ENTRY 21GA 7CM ECHOTIP (NEEDLE) ×1 IMPLANT
PACK ANGIOGRAPHY (CUSTOM PROCEDURE TRAY) IMPLANT
SET INTRO CAPELLA COAXIAL (SET/KITS/TRAYS/PACK) IMPLANT
SUT MNCRL AB 4-0 PS2 18 (SUTURE) IMPLANT
SUT SILK 0 FSL (SUTURE) IMPLANT

## 2021-10-10 NOTE — Interval H&P Note (Signed)
History and Physical Interval Note:  10/10/2021 5:18 PM  [name]  has presented today for surgery, with the diagnosis of Perm Cath Placment  ESRD.  The various methods of treatment have been discussed with the patient and family. After consideration of risks, benefits and other options for treatment, the patient has consented to  Procedure(s): DIALYSIS/PERMA CATHETER INSERTION (N/A) as a surgical intervention.  The patient's history has been reviewed, patient examined, no change in status, stable for surgery.  I have reviewed the patient's chart and labs.  Questions were answered to the patient's satisfaction.     Lawrence Perkins

## 2021-10-10 NOTE — Op Note (Signed)
[facility]   OPERATIVE NOTE     PROCEDURE: 1. Insertion of a right IJ tunneled dialysis catheter. 2. Catheter placement and cannulation under ultrasound and fluoroscopic guidance  PRE-OPERATIVE DIAGNOSIS: end-stage renal requiring hemodialysis  POST-OPERATIVE DIAGNOSIS: same as above  SURGEON: Hortencia Pilar  ANESTHESIA: Conscious sedation was administered under my direct supervision by the interventional radiology RN. IV Versed plus fentanyl were utilized. Continuous ECG, pulse oximetry and blood pressure was monitored throughout the entire procedure.  Conscious sedation was for a total of 38 minutes.  ESTIMATED BLOOD LOSS: Minimal  FINDING(S): 1.  Tips of the catheter in the right atrium on fluoroscopy 2.  No obvious pneumothorax on fluoroscopy  SPECIMEN(S):  none  INDICATIONS:   Lawrence Perkins. is a 81 y.o. male  presents with end stage renal disease.  Therefore, the patient requires a tunneled dialysis catheter placement.  The patient is informed of  the risks catheter placement include but are not limited to: bleeding, infection, central venous injury, pneumothorax, possible venous stenosis, possible malpositioning in the venous system, and possible infections related to long-term catheter presence.  The patient was aware of these risks and agreed to proceed.  DESCRIPTION: The patient was taken back to Special Procedure suite.  Prior to sedation, the patient was given IV antibiotics.  After obtaining adequate sedation, the patient was prepped and draped in the standard fashion for a right IJ tunneled dialysis catheter placement.  Appropriate Time Out is called.     The right neck and chest wall are then infiltrated with 1% Lidocaine with epinepherine.  A 19 cm tip to cuff unibody catheter is then selected, opened on the back table and prepped.  Once approximated to the chest wall this catheter is too short he was therefore thrown away and a 23 cm  tip to cuff unibody catheter was opened onto the field.  The ultrasound is placed in a sterile sleeve.  Under ultrasound guidance, the right IJ vein is examined and is noted to be echolucent and easily compressible indicating patency.   An image is recorded for the permanent record.  The right IJ vein is cannulated with the microneedle under direct ultrasound vissualization.  A Microwire followed by a micro sheath is then inserted without difficulty.   A J-wire was then advanced under fluoroscopic guidance into the inferior vena cava and the wire was secured.  Small counter incision was then made at the wire insertion site. A small pocket was fashioned with blunt dissection to allow easier passage of the cuff.  The dilator and peel-away sheath are then advanced over the wire under fluoroscopic guidance.  After approximating to the chest wall the exit site is selected.  A small incision is created and the catheter was pulled subcutaneously using the tunneler dilator tool.  The catheter is then advanced through the peel-away sheath and the peel-away sheath removed.  It is then adjusted for length so that the tip was sitting at the atrial caval junction.    Each port was tested by aspirating and flushing.  No resistance was noted.  Each port was then thoroughly flushed with heparinized saline.  The catheter was secured in placed with two interrupted stitches of 0 silk tied to the catheter.  The counter incision was closed with a U-stitch of 4-0 Monocryl.  The insertion site is then cleaned and sterile bandages applied including a Biopatch.  Each port was then packed with concentrated heparin (1000 Units/mL) at the manufacturer  recommended volumes to each port.  Sterile caps were applied to each port.  On completion fluoroscopy, the tips of the catheter were in the right atrium, and there was no evidence of pneumothorax.  COMPLICATIONS: None  CONDITION: Unchanged   Hortencia Pilar [place] [facility] Office: [phone]   10/10/2021, 6:17 PM

## 2021-10-11 ENCOUNTER — Telehealth (INDEPENDENT_AMBULATORY_CARE_PROVIDER_SITE_OTHER): Payer: Self-pay | Admitting: Vascular Surgery

## 2021-10-11 ENCOUNTER — Emergency Department: Payer: Medicare HMO

## 2021-10-11 ENCOUNTER — Encounter: Payer: Self-pay | Admitting: Emergency Medicine

## 2021-10-11 ENCOUNTER — Observation Stay
Admission: EM | Admit: 2021-10-11 | Discharge: 2021-10-12 | Disposition: A | Discharge status: Home / Self Care | Payer: Medicare HMO | Attending: Internal Medicine | Admitting: Internal Medicine

## 2021-10-11 ENCOUNTER — Observation Stay (HOSPITAL_BASED_OUTPATIENT_CLINIC_OR_DEPARTMENT_OTHER)
Admit: 2021-10-11 | Discharge: 2021-10-11 | Disposition: A | Discharge status: Home / Self Care | Payer: Medicare HMO | Attending: Internal Medicine | Admitting: Internal Medicine

## 2021-10-11 DIAGNOSIS — Z7901 Long term (current) use of anticoagulants: Secondary | ICD-10-CM | POA: Insufficient documentation

## 2021-10-11 DIAGNOSIS — I5033 Acute on chronic diastolic (congestive) heart failure: Secondary | ICD-10-CM | POA: Diagnosis not present

## 2021-10-11 DIAGNOSIS — D631 Anemia in chronic kidney disease: Secondary | ICD-10-CM | POA: Diagnosis present

## 2021-10-11 DIAGNOSIS — J90 Pleural effusion, not elsewhere classified: Secondary | ICD-10-CM | POA: Diagnosis not present

## 2021-10-11 DIAGNOSIS — R0602 Shortness of breath: Secondary | ICD-10-CM | POA: Diagnosis not present

## 2021-10-11 DIAGNOSIS — N186 End stage renal disease: Secondary | ICD-10-CM | POA: Diagnosis not present

## 2021-10-11 DIAGNOSIS — I5031 Acute diastolic (congestive) heart failure: Secondary | ICD-10-CM | POA: Diagnosis not present

## 2021-10-11 DIAGNOSIS — I482 Chronic atrial fibrillation, unspecified: Secondary | ICD-10-CM | POA: Diagnosis present

## 2021-10-11 DIAGNOSIS — J810 Acute pulmonary edema: Principal | ICD-10-CM | POA: Insufficient documentation

## 2021-10-11 DIAGNOSIS — Z96651 Presence of right artificial knee joint: Secondary | ICD-10-CM | POA: Insufficient documentation

## 2021-10-11 DIAGNOSIS — I2510 Atherosclerotic heart disease of native coronary artery without angina pectoris: Secondary | ICD-10-CM | POA: Diagnosis not present

## 2021-10-11 DIAGNOSIS — N2581 Secondary hyperparathyroidism of renal origin: Secondary | ICD-10-CM | POA: Diagnosis not present

## 2021-10-11 DIAGNOSIS — Z992 Dependence on renal dialysis: Secondary | ICD-10-CM | POA: Diagnosis not present

## 2021-10-11 DIAGNOSIS — I4892 Unspecified atrial flutter: Secondary | ICD-10-CM | POA: Insufficient documentation

## 2021-10-11 DIAGNOSIS — Z96642 Presence of left artificial hip joint: Secondary | ICD-10-CM | POA: Insufficient documentation

## 2021-10-11 DIAGNOSIS — E8770 Fluid overload, unspecified: Secondary | ICD-10-CM | POA: Diagnosis not present

## 2021-10-11 DIAGNOSIS — Z8616 Personal history of COVID-19: Secondary | ICD-10-CM | POA: Diagnosis not present

## 2021-10-11 DIAGNOSIS — I4891 Unspecified atrial fibrillation: Secondary | ICD-10-CM | POA: Diagnosis present

## 2021-10-11 DIAGNOSIS — Z87891 Personal history of nicotine dependence: Secondary | ICD-10-CM | POA: Diagnosis not present

## 2021-10-11 DIAGNOSIS — J811 Chronic pulmonary edema: Secondary | ICD-10-CM | POA: Diagnosis not present

## 2021-10-11 DIAGNOSIS — I129 Hypertensive chronic kidney disease with stage 1 through stage 4 chronic kidney disease, or unspecified chronic kidney disease: Secondary | ICD-10-CM | POA: Diagnosis not present

## 2021-10-11 DIAGNOSIS — I132 Hypertensive heart and chronic kidney disease with heart failure and with stage 5 chronic kidney disease, or end stage renal disease: Secondary | ICD-10-CM | POA: Insufficient documentation

## 2021-10-11 DIAGNOSIS — N189 Chronic kidney disease, unspecified: Secondary | ICD-10-CM | POA: Diagnosis present

## 2021-10-11 DIAGNOSIS — I120 Hypertensive chronic kidney disease with stage 5 chronic kidney disease or end stage renal disease: Secondary | ICD-10-CM | POA: Diagnosis not present

## 2021-10-11 LAB — BASIC METABOLIC PANEL
Anion gap: 7 (ref 5–15)
BUN: 47 mg/dL — ABNORMAL HIGH (ref 8–23)
CO2: 28 mmol/L (ref 22–32)
Calcium: 9.8 mg/dL (ref 8.9–10.3)
Chloride: 107 mmol/L (ref 98–111)
Creatinine, Ser: 3.95 mg/dL — ABNORMAL HIGH (ref 0.61–1.24)
GFR, Estimated: 15 mL/min — ABNORMAL LOW (ref >60)
Glucose, Bld: 93 mg/dL (ref 70–99)
Potassium: 3.9 mmol/L (ref 3.5–5.1)
Sodium: 142 mmol/L (ref 135–145)

## 2021-10-11 LAB — CBC
HCT: 31.7 % — ABNORMAL LOW (ref 39.0–52.0)
Hemoglobin: 10.3 g/dL — ABNORMAL LOW (ref 13.0–17.0)
MCH: 33.4 pg (ref 26.0–34.0)
MCHC: 32.5 g/dL (ref 30.0–36.0)
MCV: 102.9 fL — ABNORMAL HIGH (ref 80.0–100.0)
Platelets: 216 10*3/uL (ref 150–400)
RBC: 3.08 MIL/uL — ABNORMAL LOW (ref 4.22–5.81)
RDW: 13.3 % (ref 11.5–15.5)
WBC: 4.9 10*3/uL (ref 4.0–10.5)
nRBC: 0 % (ref 0.0–0.2)

## 2021-10-11 LAB — BRAIN NATRIURETIC PEPTIDE: B Natriuretic Peptide: 573.6 pg/mL — ABNORMAL HIGH (ref 0.0–100.0)

## 2021-10-11 LAB — CBG MONITORING, ED: Glucose-Capillary: 84 mg/dL (ref 70–99)

## 2021-10-11 LAB — TROPONIN I (HIGH SENSITIVITY)
Troponin I (High Sensitivity): 52 ng/L — ABNORMAL HIGH (ref <18)
Troponin I (High Sensitivity): 48 ng/L — ABNORMAL HIGH (ref <18)

## 2021-10-11 MED ORDER — TRAZODONE HCL 50 MG PO TABS
50.0000 mg | ORAL_TABLET | Freq: Every day | ORAL | Status: DC
  Administered 2021-10-11: 50 mg via ORAL
  Filled 2021-10-11: qty 1

## 2021-10-11 MED ORDER — ROSUVASTATIN CALCIUM 20 MG PO TABS
10.0000 mg | ORAL_TABLET | Freq: Every day | ORAL | Status: DC
  Administered 2021-10-11: 10 mg via ORAL
  Filled 2021-10-11 (×2): qty 1

## 2021-10-11 MED ORDER — TORSEMIDE 20 MG PO TABS
40.0000 mg | ORAL_TABLET | Freq: Every day | ORAL | Status: DC
  Administered 2021-10-11: 40 mg via ORAL
  Filled 2021-10-11: qty 2

## 2021-10-11 MED ORDER — SODIUM CHLORIDE 0.9% FLUSH
3.0000 mL | Freq: Two times a day (BID) | INTRAVENOUS | Status: DC
  Administered 2021-10-11 (×2): 3 mL via INTRAVENOUS

## 2021-10-11 MED ORDER — SENNOSIDES-DOCUSATE SODIUM 8.6-50 MG PO TABS: 1.0000 | ORAL_TABLET | Freq: Every day | ORAL | Status: DC

## 2021-10-11 MED ORDER — SODIUM CHLORIDE 0.9% FLUSH: 3.0000 mL | INTRAVENOUS | Status: DC | PRN

## 2021-10-11 MED ORDER — LIDOCAINE HCL (PF) 1 % IJ SOLN: 5.0000 mL | INTRAMUSCULAR | Status: DC | PRN

## 2021-10-11 MED ORDER — ANTICOAGULANT SODIUM CITRATE 4% (200MG/5ML) IV SOLN: 5.0000 mL | Status: DC | PRN

## 2021-10-11 MED ORDER — CHLORHEXIDINE GLUCONATE CLOTH 2 % EX PADS
6.0000 | MEDICATED_PAD | Freq: Every day | CUTANEOUS | Status: DC
  Administered 2021-10-11: 6 via TOPICAL
  Filled 2021-10-11 (×2): qty 6

## 2021-10-11 MED ORDER — ACETAMINOPHEN 325 MG PO TABS: 650.0000 mg | ORAL_TABLET | Freq: Four times a day (QID) | ORAL | Status: DC | PRN

## 2021-10-11 MED ORDER — ADULT MULTIVITAMIN W/MINERALS CH
1.0000 | ORAL_TABLET | Freq: Every day | ORAL | Status: DC
  Administered 2021-10-11: 1 via ORAL
  Filled 2021-10-11: qty 1

## 2021-10-11 MED ORDER — LIDOCAINE-PRILOCAINE 2.5-2.5 % EX CREA: 1.0000 | TOPICAL_CREAM | CUTANEOUS | Status: DC | PRN

## 2021-10-11 MED ORDER — CARVEDILOL 3.125 MG PO TABS
3.1250 mg | ORAL_TABLET | Freq: Two times a day (BID) | ORAL | Status: DC
  Administered 2021-10-11: 3.125 mg via ORAL
  Filled 2021-10-11: qty 1

## 2021-10-11 MED ORDER — CALCITRIOL 0.25 MCG PO CAPS
0.2500 ug | ORAL_CAPSULE | Freq: Every day | ORAL | Status: DC
  Administered 2021-10-11: 0.25 ug via ORAL
  Filled 2021-10-11 (×2): qty 1

## 2021-10-11 MED ORDER — VITAMIN D 25 MCG (1000 UNIT) PO TABS
1000.0000 [IU] | ORAL_TABLET | Freq: Every day | ORAL | Status: DC
  Administered 2021-10-11: 1000 [IU] via ORAL
  Filled 2021-10-11: qty 1

## 2021-10-11 MED ORDER — SODIUM CHLORIDE 0.9 % IV SOLN: 250.0000 mL | INTRAVENOUS | Status: DC | PRN

## 2021-10-11 MED ORDER — APIXABAN 2.5 MG PO TABS
2.5000 mg | ORAL_TABLET | Freq: Two times a day (BID) | ORAL | Status: DC
  Administered 2021-10-11 (×2): 2.5 mg via ORAL
  Filled 2021-10-11 (×3): qty 1

## 2021-10-11 MED ORDER — ONDANSETRON HCL 4 MG/2ML IJ SOLN: 4.0000 mg | Freq: Four times a day (QID) | INTRAMUSCULAR | Status: DC | PRN

## 2021-10-11 MED ORDER — PENTAFLUOROPROP-TETRAFLUOROETH EX AERO: 1.0000 | INHALATION_SPRAY | CUTANEOUS | Status: DC | PRN

## 2021-10-11 MED ORDER — ONDANSETRON HCL 4 MG PO TABS: 4.0000 mg | ORAL_TABLET | Freq: Four times a day (QID) | ORAL | Status: DC | PRN

## 2021-10-11 MED ORDER — HEPARIN SODIUM (PORCINE) 1000 UNIT/ML DIALYSIS: 1000.0000 [IU] | INTRAMUSCULAR | Status: DC | PRN

## 2021-10-11 MED ORDER — TAMSULOSIN HCL 0.4 MG PO CAPS
0.4000 mg | ORAL_CAPSULE | Freq: Every day | ORAL | Status: DC
  Administered 2021-10-11: 0.4 mg via ORAL
  Filled 2021-10-11: qty 1

## 2021-10-11 MED ORDER — ALTEPLASE 2 MG IJ SOLR: 2.0000 mg | Freq: Once | INTRAMUSCULAR | Status: DC | PRN

## 2021-10-11 MED ORDER — B COMPLEX-C PO TABS
1.0000 | ORAL_TABLET | Freq: Every day | ORAL | Status: DC
  Administered 2021-10-11: 1 via ORAL
  Filled 2021-10-11 (×2): qty 1

## 2021-10-11 MED ORDER — ACETAMINOPHEN 650 MG RE SUPP: 650.0000 mg | Freq: Four times a day (QID) | RECTAL | Status: DC | PRN

## 2021-10-11 NOTE — Assessment & Plan Note (Signed)
Patient presents for evaluation of worsening shortness of breath associated with orthopnea. Has not had his renal replacement therapy in about 10 days Nephrology consult for renal replacement therapy to optimize volume status Continue torsemide and carvedilol Patient's last 2D echocardiogram from 2021 showed an LVEF of 55% with LVH. We will repeat 2D echocardiogram Optimize blood pressure control

## 2021-10-11 NOTE — Progress Notes (Signed)
Post HD RN assessment 

## 2021-10-11 NOTE — ED Provider Notes (Signed)
[facility] Provider Note    Event Date/Time   First MD Initiated Contact with Patient 10/11/21 [phone]     (approximate)   History   Shortness of Breath   HPI  Lawrence Perkins. is a 81 y.o. male with history of atrial fibrillation, cardiomyopathy, end-stage renal disease on hemodialysis, hypertension, hyperlipidemia who presents to the emergency department his wife with increasing shortness of breath worse with exertion and lying flat today.  States he was on peritoneal dialysis at home for several weeks but was not able to tolerate it due to several abdominal hernias.  He was scheduled to be transition to hemodialysis but has not started it yet as he just had a tunneled catheter placed by Dr. Delana Meyer yesterday.  He still makes some urine.  Does not wear oxygen chronically.  States his last session of peritoneal dialysis at home was 10 days ago.   History provided by patient and wife.    Past Medical History:  Diagnosis Date   Anemia    Aortic atherosclerosis (HCC)    Arthritis    Atrial fibrillation and flutter ([place])    a.) CHA2DS2-VASc = 5 (age x 2, CHF, HTN, aortic plaque). b.) rate/rhythm maintain on oral carvedilol; chronically anticoagulated with dose reduced apixaban   Bilateral inguinal hernia    BPH (benign prostatic hyperplasia)    Cardiac syncope    Cardiomyopathy ([place])    a.) TTE 04/23/2016: EF 40%. b.) TTE 09/03/2016: EF 50%. c.) TTE 05/04/2019: EF >55%.   CHF (congestive heart failure) ([place])    a.) TTE 04/23/2016: EF 40%; BAE; mild AR/TR, mod MR; G1DD. b.) TTE 09/03/2016: EF 50%; mild LVH; BAE; triv AR/PR, mod MR/TR. c.) TTE 05/04/2019: EF >55%; mod LVH, mild LA enlargement; mild AR/MR/TR   CKD (chronic kidney disease), stage V (HCC)    Complication of anesthesia    a.) h/o intra/postoperative A.fib   Coronary artery disease    Diverticulosis    Gout    History of 2019 novel coronavirus disease (COVID-19) 11/14/2020   History of  colon polyps    Hypercholesteremia    Hypertension    Long term current use of anticoagulant    a.) dose reduced apixaban; dose reduction d/t to age and CKD Dx.   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[place])    a.) s/p resection   MGUS (monoclonal gammopathy of unknown significance)    Pleural effusion    Rectal bleeding    Secondary hyperparathyroidism of renal origin [facility])     Past Surgical History:  Procedure Laterality Date   ANKLE SURGERY Right 1985   following volleyball injury   APPENDECTOMY     at the of 9 yrs   CAPD INSERTION N/A 08/10/2021   Procedure: LAPAROSCOPIC INSERTION CONTINUOUS AMBULATORY PERITONEAL DIALYSIS  (CAPD) CATHETER;  Surgeon: Jules Husbands, MD;  Location: [facility] ORS;  Service: General;  Laterality: N/A;   CAPD REVISION N/A 09/21/2021   Procedure: LAPAROSCOPIC REVISION CONTINUOUS AMBULATORY PERITONEAL DIALYSIS  (CAPD) CATHETER;  Surgeon: Jules Husbands, MD;  Location: [facility] ORS;  Service: General;  Laterality: N/A;   COLONOSCOPY     COLONOSCOPY WITH PROPOFOL N/A 01/02/2017   Procedure: COLONOSCOPY WITH PROPOFOL;  Surgeon: Manya Silvas, MD;  Location: [facility] ENDOSCOPY;  Service: Endoscopy;  Laterality: N/A;   DIALYSIS/PERMA CATHETER INSERTION N/A 03/27/2021   Procedure: DIALYSIS/PERMA CATHETER INSERTION;  Surgeon: Algernon Huxley, MD;  Location: [place] CV LAB;  Service: Cardiovascular;  Laterality: N/A;  DIALYSIS/PERMA CATHETER REMOVAL N/A 05/08/2021   Procedure: DIALYSIS/PERMA CATHETER REMOVAL;  Surgeon: Algernon Huxley, MD;  Location: [place] CV LAB;  Service: Cardiovascular;  Laterality: N/A;   INSERTION OF MESH  08/10/2021   Procedure: INSERTION OF MESH;  Surgeon: Jules Husbands, MD;  Location: [facility] ORS;  Service: General;;   JOINT REPLACEMENT     RIGHT TOTAL HIP   KNEE ARTHROSCOPY WITH LATERAL RELEASE Right 04/20/2021   Procedure: Right knee medial retinaculum and lateral release with polyethylene exchange;  Surgeon: Hessie Knows, MD;  Location: [facility] ORS;  Service:  Orthopedics;  Laterality: Right;   TONSILLECTOMY     as a child   TOTAL HIP ARTHROPLASTY  09/2009   Right femoral neck fracture   TOTAL HIP ARTHROPLASTY Left 03/06/2016   Procedure: TOTAL HIP ARTHROPLASTY ANTERIOR APPROACH;  Surgeon: Hessie Knows, MD;  Location: [facility] ORS;  Service: Orthopedics;  Laterality: Left;   TOTAL KNEE ARTHROPLASTY Right 03/21/2021   Procedure: TOTAL KNEE ARTHROPLASTY;  Surgeon: Hessie Knows, MD;  Location: [facility] ORS;  Service: Orthopedics;  Laterality: Right;   UMBILICAL HERNIA REPAIR  08/10/2021   Procedure: HERNIA REPAIR UMBILICAL ADULT;  Surgeon: Jules Husbands, MD;  Location: [facility] ORS;  Service: General;;    MEDICATIONS:  Prior to Admission medications   Medication Sig Start Date End Date Taking? Authorizing Provider  apixaban (ELIQUIS) 2.5 MG TABS tablet Take 2.5 mg by mouth 2 (two) times daily.    [provider]  B Complex Vitamins (B-COMPLEX/B-12 PO) Take 1,000 mcg by mouth.    [provider]  calcitRIOL (ROCALTROL) 0.25 MCG capsule Take 0.25 mcg by mouth daily. 07/11/21   [provider]  carvedilol (COREG) 3.125 MG tablet Take 3.125 mg by mouth 2 (two) times daily with a meal.     [provider]  cholecalciferol (VITAMIN D3) 25 MCG (1000 UNIT) tablet Take 1,000 Units by mouth daily.    [provider]  Multiple Vitamin (MULTIVITAMIN WITH MINERALS) TABS tablet Take 1 tablet by mouth daily.    [provider]  rosuvastatin (CRESTOR) 10 MG tablet Take 10 mg by mouth at bedtime. 09/23/17   [provider]  senna-docusate (SENOKOT-S) 8.6-50 MG tablet Take 1 tablet by mouth daily.    [provider]  tamsulosin (FLOMAX) 0.4 MG CAPS capsule Take 1 capsule (0.4 mg total) by mouth daily. 07/19/21   Zara Council A, PA-C  torsemide (DEMADEX) 20 MG tablet Take 2 tablets (40 mg total) by mouth daily. 02/18/18   Mayo, Pete Pelt, MD  traZODone (DESYREL) 50 MG tablet Take by mouth. 10/03/21    [provider]    Physical Exam   Triage Vital Signs: ED Triage Vitals  Enc Vitals Group     BP 10/11/21 0518 (!) 175/88     Pulse Rate 10/11/21 0518 72     Resp 10/11/21 0518 20     Temp 10/11/21 0518 97.7 F (36.5 C)     Temp Source 10/11/21 0518 Oral     SpO2 10/11/21 0518 95 %     Weight 10/11/21 0512 179 lb 14.4 oz (81.6 kg)     Height 10/11/21 0512 '5\' 10"'$  (1.778 m)     Head Circumference --      Peak Flow --      Pain Score 10/11/21 0522 0     Pain Loc --      Pain Edu? --      Excl. in [place]? --  Most recent vital signs: Vitals:   10/11/21 0518  BP: (!) 175/88  Pulse: 72  Resp: 20  Temp: 97.7 F (36.5 C)  SpO2: 95%    CONSTITUTIONAL: Alert and oriented and responds appropriately to questions. Well-appearing; well-nourished HEAD: Normocephalic, atraumatic EYES: Conjunctivae clear, pupils appear equal, sclera nonicteric ENT: normal nose; moist mucous membranes NECK: Supple, normal ROM CARD: RRR; S1 and S2 appreciated; no murmurs, no clicks, no rubs, no gallops RESP: Does appear slightly dyspneic but is not hypoxic here.  No rhonchi but patient does have rales and diminished aeration at his bases.  No wheezing. ABD/GI: Normal bowel sounds; non-distended; soft, non-tender, no rebound, no guarding, no peritoneal signs BACK: The back appears normal EXT: Normal ROM in all joints; no deformity noted, no edema; no cyanosis, no calf tenderness or calf swelling SKIN: Normal color for age and race; warm; no rash on exposed skin NEURO: Moves all extremities equally, normal speech PSYCH: The patient's mood and manner are appropriate.   ED Results / Procedures / Treatments   LABS: (all labs ordered are listed, but only abnormal results are displayed) Labs Reviewed  BASIC METABOLIC PANEL - Abnormal; Notable for the following components:      Result Value   BUN 47 (*)    Creatinine, Ser 3.95 (*)    GFR, Estimated 15 (*)    All other components within  normal limits  CBC - Abnormal; Notable for the following components:   RBC 3.08 (*)    Hemoglobin 10.3 (*)    HCT 31.7 (*)    MCV 102.9 (*)    All other components within normal limits  BRAIN NATRIURETIC PEPTIDE - Abnormal; Notable for the following components:   B Natriuretic Peptide 573.6 (*)    All other components within normal limits  TROPONIN I (HIGH SENSITIVITY) - Abnormal; Notable for the following components:   Troponin I (High Sensitivity) 52 (*)    All other components within normal limits  TROPONIN I (HIGH SENSITIVITY)     EKG:  EKG Interpretation  Date/Time:  Wednesday October 11 2021 05:13:41 EDT Ventricular Rate:  61 PR Interval:    QRS Duration: 86 QT Interval:  432 QTC Calculation: 434 R Axis:   59 Text Interpretation: Atrial flutter with variable A-V block Nonspecific ST and T wave abnormality Abnormal ECG When compared with ECG of 09-Mar-2021 10:25, Non-specific change in ST segment in Inferior leads T wave inversion more evident in Anterior leads Confirmed by Pryor Curia [phone]) on 10/11/2021 6:50:42 AM         RADIOLOGY: My personal review and interpretation of imaging: Chest x-ray shows bilateral pleural effusions and developing interstitial edema.  I have personally reviewed all radiology reports.   DG Chest 1 View  Result Date: 10/11/2021 CLINICAL DATA:  81 year old male with shortness of breath. Dialysis catheter placement yesterday. EXAM: CHEST  1 VIEW COMPARISON:  Portable chest 03/27/2021 and earlier. FINDINGS: Portable AP upright view at 0537 hours. Right chest dual lumen tunneled type dialysis catheter in place, similar to that in March. Small bilateral pleural effusions are apparent, and new or increased since March. Stable cardiac size and mediastinal contours. Visualized tracheal air column is within normal limits. Similar pulmonary vascular congestion, symmetric. No pneumothorax or consolidation. No acute osseous abnormality identified.  Paucity of bowel gas in the upper abdomen. IMPRESSION: 1. Symmetric pulmonary vascular congestion and small bilateral pleural effusions which are new or increased since March. Consider mild or developing interstitial edema. 2. Right chest  dialysis catheter in place. Electronically Signed   By: Genevie Ann M.D.   On: 10/11/2021 06:16   PERIPHERAL VASCULAR CATHETERIZATION  Result Date: 10/10/2021 See surgical note for result.    PROCEDURES:  Critical Care performed: No     .1-3 Lead EKG Interpretation  Performed by: Halcyon Heck, Delice Bison, DO Authorized by: Massiah Longanecker, Delice Bison, DO     Interpretation: normal     ECG rate:  72   ECG rate assessment: normal     Rhythm: atrial flutter     Ectopy: none     Conduction: normal       IMPRESSION / MDM / ASSESSMENT AND PLAN / ED COURSE  I reviewed the triage vital signs and the nursing notes.    Patient here for increasing shortness of breath, dyspnea and on exertion and orthopnea.  Has not had dialysis in 10 days.  Had a tunneled catheter placed yesterday by vascular surgery.  The patient is on the cardiac monitor to evaluate for evidence of arrhythmia and/or significant heart rate changes.   DIFFERENTIAL DIAGNOSIS (includes but not limited to):   Pulmonary edema, pneumonia, pneumothorax, COVID, PE, ACS   Patient's presentation is most consistent with acute presentation with potential threat to life or bodily function.   PLAN: Labs obtained from triage.  He has normal potassium.  Chronic stable anemia.  Troponin elevated at 52 which may be from demand but also related to his end-stage renal disease.  No old for comparison.  BNP is 573.  Chest x-ray reviewed and interpreted by myself and the radiologist and shows pulmonary vascular congestion, bilateral pleural effusions and developing interstitial edema.  No infiltrate, pneumothorax.  I feel he will need dialysis today.  Will discuss with nephrology on-call and also with hospitalist for admission.   Patient comfortable with this plan.   MEDICATIONS GIVEN IN ED: Medications - No data to display   ED COURSE:  6:55 AM  Spoke with Dr. Holley Raring on-call for nephrology.  Appreciate his help.  He will get patient set up for dialysis today and agrees with hospitalization to the medicine service.   CONSULTS:  Consulted and discussed patient's case with hospitalist, Dr. Francine Graven.  I have recommended admission and consulting physician agrees and will place admission orders.  Patient (and family if present) agree with this plan.   I reviewed all nursing notes, vitals, pertinent previous records.  All labs, EKGs, imaging ordered have been independently reviewed and interpreted by myself.    OUTSIDE RECORDS REVIEWED: Reviewed Dr. Nino Parsley operative note yesterday for insertion of right IJ tunneled dialysis catheter.       FINAL CLINICAL IMPRESSION(S) / ED DIAGNOSES   Final diagnoses:  Acute pulmonary edema ([place])  End-stage renal disease needing dialysis ([place])     Rx / DC Orders   ED Discharge Orders     None        Note:  This document was prepared using Dragon voice recognition software and may include unintentional dictation errors.   Aristides Luckey, Delice Bison, DO 10/11/21 [phone]

## 2021-10-11 NOTE — Assessment & Plan Note (Addendum)
H&H is stable ?Monitor closely during this hospitalization ?

## 2021-10-11 NOTE — Progress Notes (Signed)
[facility]  ROUNDING NOTE   Subjective:   Lawrence Perkins. is a 81 year old male with past medical conditions including atrial fibrillation on Eliquis, anemia, systolic CHF, diverticulitis, hypertension, arthritis, BPH, and end-stage renal disease on peritoneal dialysis.  Patient presents to the emergency department with progressive shortness of breath and has been admitted under observation for Acute CHF (congestive heart failure) ([place]) [I50.9]  Patient is known to our practice and is followed outpatient by Dr. Holley Raring at the [facility] home dialysis clinic.  Patient had PD catheter placed on 08/10/2021 and had completed training and begun home use.  Patient states during his dialysis treatments, he will develop diarrhea.  After 2 episodes of this, he aborted peritoneal dialysis treatments at home.  He had PermCath placed yesterday by vascular surgery and was scheduled to follow-up with surgeon to evaluate peritoneal dialysis catheter today.  He states he has progressively became short of breath over the past 3 to 4 days.  Denies nausea or vomiting.  Continues to have shortness of breath on exertion, room air at this time.  Pertinent labs on ED arrival include BUN 47, creatinine 3.95 with GFR 15.  Troponin 52 with BNP 573.  Chest x-ray shows pulmonary vascular congestion with small bilateral pleural effusions.  We have been consulted to evaluate and provide dialysis during this admission.   Objective:  Vital signs in last 24 hours:  Temp:  [97.7 F (36.5 C)-98 F (36.7 C)] 97.7 F (36.5 C) (10/11 0948) Pulse Rate:  [56-72] 62 (10/11 1150) Resp:  [16-63] 16 (10/11 1150) BP: (150-203)/(76-110) 178/92 (10/11 1150) SpO2:  [93 %-99 %] 97 % (10/11 1150) Weight:  [81.2 kg-81.6 kg] 81.6 kg (10/11 0512)  Weight change:  Filed Weights   10/11/21 0512  Weight: 81.6 kg    Intake/Output: No intake/output data recorded.   Intake/Output this shift:  No intake/output data  recorded.  Physical Exam: General: NAD, resting comfortably  Head: Normocephalic, atraumatic. Moist oral mucosal membranes  Eyes: Anicteric  Lungs:  Diminished in bases, normal effort, room air  Heart: Regular rate and rhythm  Abdomen:  Soft, nontender, PD catheter  Extremities: No peripheral edema.  Neurologic: Nonfocal, moving all four extremities  Skin: No lesions  Access: PD catheter, right chest PermCath    Basic Metabolic Panel: Recent Labs  Lab 10/11/21 0521  NA 142  K 3.9  CL 107  CO2 28  GLUCOSE 93  BUN 47*  CREATININE 3.95*  CALCIUM 9.8    Liver Function Tests: No results for input(s): "AST", "ALT", "ALKPHOS", "BILITOT", "PROT", "ALBUMIN" in the last 168 hours. No results for input(s): "LIPASE", "AMYLASE" in the last 168 hours. No results for input(s): "AMMONIA" in the last 168 hours.  CBC: Recent Labs  Lab 10/11/21 0521  WBC 4.9  HGB 10.3*  HCT 31.7*  MCV 102.9*  PLT 216    Cardiac Enzymes: No results for input(s): "CKTOTAL", "CKMB", "CKMBINDEX", "TROPONINI" in the last 168 hours.  BNP: Invalid input(s): "POCBNP"  CBG: Recent Labs  Lab 10/11/21 0808  GLUCAP 20    Microbiology: Results for orders placed or performed in visit on 06/06/21  Microscopic Examination     Status: Abnormal   Collection Time: 06/06/21 11:24 AM   Urine  Result Value Ref Range Status   WBC, UA 0-5 0 - 5 /hpf Final   RBC, Urine 3-10 (A) 0 - 2 /hpf Final   Epithelial Cells (non renal) 0-10 0 - 10 /hpf Final  Casts Present (A) None seen /lpf Final   Cast Type Granular casts (A) N/A Final   Bacteria, UA None seen None seen/Few Final    Coagulation Studies: No results for input(s): "LABPROT", "INR" in the last 72 hours.  Urinalysis: No results for input(s): "COLORURINE", "LABSPEC", "PHURINE", "GLUCOSEU", "HGBUR", "BILIRUBINUR", "KETONESUR", "PROTEINUR", "UROBILINOGEN", "NITRITE", "LEUKOCYTESUR" in the last 72 hours.  Invalid input(s): "APPERANCEUR"     Imaging: DG Chest 1 View  Result Date: 10/11/2021 CLINICAL DATA:  81 year old male with shortness of breath. Dialysis catheter placement yesterday. EXAM: CHEST  1 VIEW COMPARISON:  Portable chest 03/27/2021 and earlier. FINDINGS: Portable AP upright view at 0537 hours. Right chest dual lumen tunneled type dialysis catheter in place, similar to that in March. Small bilateral pleural effusions are apparent, and new or increased since March. Stable cardiac size and mediastinal contours. Visualized tracheal air column is within normal limits. Similar pulmonary vascular congestion, symmetric. No pneumothorax or consolidation. No acute osseous abnormality identified. Paucity of bowel gas in the upper abdomen. IMPRESSION: 1. Symmetric pulmonary vascular congestion and small bilateral pleural effusions which are new or increased since March. Consider mild or developing interstitial edema. 2. Right chest dialysis catheter in place. Electronically Signed   By: Genevie Ann M.D.   On: 10/11/2021 06:16   PERIPHERAL VASCULAR CATHETERIZATION  Result Date: 10/10/2021 See surgical note for result.    Medications:    sodium chloride     anticoagulant sodium citrate      apixaban  2.5 mg Oral BID   B-complex with vitamin C  1 tablet Oral Daily   calcitRIOL  0.25 mcg Oral Daily   carvedilol  3.125 mg Oral BID WC   Chlorhexidine Gluconate Cloth  6 each Topical Q0600   cholecalciferol  1,000 Units Oral Daily   multivitamin with minerals  1 tablet Oral Daily   rosuvastatin  10 mg Oral QHS   senna-docusate  1 tablet Oral Daily   sodium chloride flush  3 mL Intravenous Q12H   tamsulosin  0.4 mg Oral Daily   torsemide  40 mg Oral Daily   traZODone  50 mg Oral QHS   sodium chloride, acetaminophen **OR** acetaminophen, alteplase, anticoagulant sodium citrate, heparin, lidocaine (PF), lidocaine-prilocaine, ondansetron **OR** ondansetron (ZOFRAN) IV, pentafluoroprop-tetrafluoroeth, sodium chloride  flush  Assessment/ Plan:  Lawrence Perkins. is a 81 y.o.  male with past medical conditions including atrial fibrillation on Eliquis, anemia, systolic CHF, diverticulitis, hypertension, arthritis, BPH, and end-stage renal disease on peritoneal dialysis.  Patient presents to the emergency department with progressive shortness of breath and has been admitted under observation for Acute CHF (congestive heart failure) ([place]) [I50.9]  [facility], back up HD  Fluid volume overload with end-stage renal disease requiring dialysis.  Peritoneal dialysis catheter malfunction prior to admission, evaluation schedule outpatient surgery.  Right chest PermCath placed yesterday by vascular surgery to provide backup hemodialysis.  Last dialysis treatment on 10/01/2021.  Pulmonary congestion noted on chest x-ray.  Will receive urgent dialysis with UF goal 2 to 2.5 L as tolerated.  Will offer additional dialysis of treatment tomorrow to remove additional fluid.  We will continue hemodialysis until peritoneal dialysis catheter revised.   *Patient requested early termination of dialysis treatment, 1 hour early, due to need of restroom.  Patient refused bedpan.  2. Anemia of chronic kidney disease Lab Results  Component Value Date   HGB 10.3 (L) 10/11/2021    Hgb within desired target.  3. Secondary Hyperparathyroidism: with outpatient labs:  PTH 54.0, phosphorus 4.7, calcium 9.3 on 10/02/21.   Lab Results  Component Value Date   CALCIUM 9.8 10/11/2021   CAION 1.21 09/21/2021   PHOS 6.6 (H) 03/25/2021    Calcium at goal however Phosphorus elevated, likely due to missed dialysis treatments.   4.  Hypertension with chronic kidney disease.  Home regimen includes carvedilol and torsemide.  Currently receiving these medications.  Blood pressure currently elevated, 186/97.  This may be reflection of missed dialysis treatments.   LOS: 0 Tenika Keeran 10/11/202312:17 PM

## 2021-10-11 NOTE — Progress Notes (Signed)
Pt signed off HD 1 hour early AMA d/t need to use a bathroom. Received 2.5 hours of HD. Pt refused bedpan. Alert, no c/o, vss, report to ED RN. Start: 0956 End: 4709 2036m fluid removed 58.4L BVP No meds w/ HD

## 2021-10-11 NOTE — Progress Notes (Signed)
Pre hd rn assessment 

## 2021-10-11 NOTE — Assessment & Plan Note (Signed)
Continue carvedilol for rate control Continue apixaban as primary prophylaxis for an acute stroke

## 2021-10-11 NOTE — H&P (Signed)
History and Physical    Patient: Lawrence Perkins. FAO:[id number] DOB: 1940-02-16 DOA: 10/11/2021 DOS: the patient was seen and examined on 10/11/2021 PCP: Kirk Ruths, MD  Patient coming from: Home  Chief Complaint:  Chief Complaint  Patient presents with   Shortness of Breath   HPI: Lawrence Perkins. is a 81 y.o. 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 Patient has end-stage renal disease and was on peritoneal dialysis but unable to tolerate it due to several abdominal hernias that need to be fixed.  He was scheduled to transition to hemodialysis and is status post a tunneled catheter placement on 10/10/21.  Patient's last peritoneal dialysis treatment was on 10/01/21.  He notes shortness of breath mostly with exertion worse on the day of admission associated with orthopnea.  He denies having any lower extremity swelling.  Patient continues to void and is on torsemide 40 mg daily which she has been taking as recommended. He was noted to have significantly elevated blood pressure upon arrival to the ER and chest x-ray showed symmetric pulmonary vascular congestion and small bilateral pleural effusions which are new or increased since March.  He will be referred to observation status for further evaluation.  Review of Systems: As mentioned in the history of present illness. All other systems reviewed and are negative. Past Medical History:  Diagnosis Date   Anemia    Aortic atherosclerosis (HCC)    Arthritis    Atrial fibrillation and flutter ([place])    a.) CHA2DS2-VASc = 5 (age x 2, CHF, HTN, aortic plaque). b.) rate/rhythm maintain on oral carvedilol; chronically anticoagulated with dose  reduced apixaban   Bilateral inguinal hernia    BPH (benign prostatic hyperplasia)    Cardiac syncope    Cardiomyopathy ([place])    a.) TTE 04/23/2016: EF 40%. b.) TTE 09/03/2016: EF 50%. c.) TTE 05/04/2019: EF >55%.   CHF (congestive heart failure) ([place])    a.) TTE 04/23/2016: EF 40%; BAE; mild AR/TR, mod MR; G1DD. b.) TTE 09/03/2016: EF 50%; mild LVH; BAE; triv AR/PR, mod MR/TR. c.) TTE 05/04/2019: EF >55%; mod LVH, mild LA enlargement; mild AR/MR/TR   CKD (chronic kidney disease), stage V (HCC)    Complication of anesthesia    a.) h/o intra/postoperative A.fib   Coronary artery disease    Diverticulosis    Gout    History of 2019 novel coronavirus disease (COVID-19) 11/14/2020   History of colon polyps    Hypercholesteremia    Hypertension    Long term current use of anticoagulant    a.) dose reduced apixaban; dose reduction d/t to age and CKD Dx.   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[place])    a.) s/p resection   MGUS (monoclonal gammopathy of unknown significance)    Pleural effusion    Rectal bleeding    Secondary hyperparathyroidism of renal origin [facility])    Past Surgical History:  Procedure Laterality Date   ANKLE SURGERY Right 1985   following volleyball injury   APPENDECTOMY     at the of 9 yrs   CAPD INSERTION N/A 08/10/2021   Procedure: LAPAROSCOPIC INSERTION CONTINUOUS AMBULATORY PERITONEAL DIALYSIS  (CAPD) CATHETER;  Surgeon: Jules Husbands, MD;  Location: [facility] ORS;  Service: General;  Laterality: N/A;   CAPD REVISION  N/A 09/21/2021   Procedure: LAPAROSCOPIC REVISION CONTINUOUS AMBULATORY PERITONEAL DIALYSIS  (CAPD) CATHETER;  Surgeon: Jules Husbands, MD;  Location: [facility] ORS;  Service: General;  Laterality: N/A;   COLONOSCOPY     COLONOSCOPY WITH PROPOFOL N/A 01/02/2017   Procedure: COLONOSCOPY WITH PROPOFOL;  Surgeon: Manya Silvas, MD;  Location: [facility] ENDOSCOPY;  Service: Endoscopy;  Laterality: N/A;   DIALYSIS/PERMA CATHETER INSERTION N/A 03/27/2021   Procedure: DIALYSIS/PERMA  CATHETER INSERTION;  Surgeon: Algernon Huxley, MD;  Location: [place] CV LAB;  Service: Cardiovascular;  Laterality: N/A;   DIALYSIS/PERMA CATHETER REMOVAL N/A 05/08/2021   Procedure: DIALYSIS/PERMA CATHETER REMOVAL;  Surgeon: Algernon Huxley, MD;  Location: [place] CV LAB;  Service: Cardiovascular;  Laterality: N/A;   INSERTION OF MESH  08/10/2021   Procedure: INSERTION OF MESH;  Surgeon: Jules Husbands, MD;  Location: [facility] ORS;  Service: General;;   JOINT REPLACEMENT     RIGHT TOTAL HIP   KNEE ARTHROSCOPY WITH LATERAL RELEASE Right 04/20/2021   Procedure: Right knee medial retinaculum and lateral release with polyethylene exchange;  Surgeon: Hessie Knows, MD;  Location: [facility] ORS;  Service: Orthopedics;  Laterality: Right;   TONSILLECTOMY     as a child   TOTAL HIP ARTHROPLASTY  09/2009   Right femoral neck fracture   TOTAL HIP ARTHROPLASTY Left 03/06/2016   Procedure: TOTAL HIP ARTHROPLASTY ANTERIOR APPROACH;  Surgeon: Hessie Knows, MD;  Location: [facility] ORS;  Service: Orthopedics;  Laterality: Left;   TOTAL KNEE ARTHROPLASTY Right 03/21/2021   Procedure: TOTAL KNEE ARTHROPLASTY;  Surgeon: Hessie Knows, MD;  Location: [facility] ORS;  Service: Orthopedics;  Laterality: Right;   UMBILICAL HERNIA REPAIR  08/10/2021   Procedure: HERNIA REPAIR UMBILICAL ADULT;  Surgeon: Jules Husbands, MD;  Location: [facility] ORS;  Service: General;;   Social History:  reports that he quit smoking about 40 years ago. His smoking use included cigarettes. He has never used smokeless tobacco. He reports that he does not currently use alcohol. He reports that he does not use drugs.  No Known Allergies  Family History  Problem Relation Age of Onset   Cirrhosis Mother    Other Father        Lung Fibrosis   Prostate cancer Neg Hx    Kidney cancer Neg Hx    Bladder Cancer Neg Hx     Prior to Admission medications   Medication Sig Start Date End Date Taking? Authorizing Provider  apixaban (ELIQUIS) 2.5 MG TABS  tablet Take 2.5 mg by mouth 2 (two) times daily.   Yes [provider]  B Complex Vitamins (B-COMPLEX/B-12 PO) Take 1,000 mcg by mouth.   Yes [provider]  calcitRIOL (ROCALTROL) 0.25 MCG capsule Take 0.25 mcg by mouth daily. 07/11/21  Yes [provider]  carvedilol (COREG) 3.125 MG tablet Take 3.125 mg by mouth 2 (two) times daily with a meal.    Yes [provider]  cholecalciferol (VITAMIN D3) 25 MCG (1000 UNIT) tablet Take 1,000 Units by mouth daily.   Yes [provider]  Multiple Vitamin (MULTIVITAMIN WITH MINERALS) TABS tablet Take 1 tablet by mouth daily.   Yes [provider]  rosuvastatin (CRESTOR) 10 MG tablet Take 10 mg by mouth at bedtime. 09/23/17  Yes [provider]  senna-docusate (SENOKOT-S) 8.6-50 MG tablet Take 1 tablet by mouth daily.   Yes [provider]  tamsulosin (FLOMAX) 0.4 MG CAPS capsule Take 1 capsule (0.4 mg total) by mouth daily. 07/19/21  Yes McGowan, Shannon A, PA-C  torsemide (DEMADEX) 20 MG tablet Take 2 tablets (40 mg total) by mouth daily. 02/18/18  Yes Mayo, Pete Pelt, MD  traZODone (DESYREL) 50 MG tablet Take by mouth. 10/03/21  Yes [provider]    Physical Exam: Vitals:   10/11/21 0956 10/11/21 1020 10/11/21 1050 10/11/21 1120  BP: (!) 187/92 (!) 194/88 (!) 171/110 (!) 176/107  Pulse: (!) 57 61 63 70  Resp: (!) '22 19 19 17  '$ Temp:      TempSrc:      SpO2: 97% 97% 97% 97%  Weight:      Height:       Physical Exam Vitals and nursing note reviewed.  Constitutional:      Appearance: He is well-developed.  HENT:     Head: Normocephalic.     Mouth/Throat:     Mouth: Mucous membranes are moist.  Eyes:     Pupils: Pupils are equal, round, and reactive to light.  Cardiovascular:     Rate and Rhythm: Normal rate and regular rhythm.  Pulmonary:     Effort: Tachypnea present.     Breath sounds: Examination of the right-lower field reveals rales. Examination of the  left-lower field reveals rales. Rales present.  Abdominal:     General: Bowel sounds are normal.     Palpations: Abdomen is soft.  Musculoskeletal:        General: Normal range of motion.  Skin:    General: Skin is warm and dry.  Neurological:     General: No focal deficit present.     Mental Status: He is alert.  Psychiatric:        Mood and Affect: Mood normal.        Behavior: Behavior normal.     Data Reviewed: Relevant notes from primary care and specialist visits, past discharge summaries as available in EHR, including Care Everywhere. Prior diagnostic testing as pertinent to current admission diagnoses Updated medications and problem lists for reconciliation ED course, including vitals, labs, imaging, treatment and response to treatment Triage notes, nursing and pharmacy notes and ED provider's notes Notable results as noted in HPI Labs reviewed.  Glucose 84, troponin 52 >> 48, BNP 573, sodium 142, potassium 3.9, chloride 107, bicarb 28, glucose 93, BUN 47, creatinine 3.95, calcium 9.8, white count 4.9, hemoglobin 10.3, hematocrit 31.7, platelet count 216 Chest x-ray reviewed by me shows symmetric pulmonary vascular congestion and small bilateral pleural effusions which are new or increased since March. Consider mild or developing interstitial edema. Right chest dialysis catheter in place. Twelve-lead EKG shows atrial flutter with nonspecific ST and T wave changes There are no new results to review at this time.  Assessment and Plan: Anemia due to chronic kidney disease H&H is stable Monitor closely during this hospitalization   Acute on chronic diastolic CHF (congestive heart failure) ([place]) Patient presents for evaluation of worsening shortness of breath associated with orthopnea. Has not had his renal replacement therapy in about 10 days Nephrology consult for renal replacement therapy to optimize volume status Continue torsemide and carvedilol Patient's last 2D  echocardiogram from 2021 showed an LVEF of 55% with LVH. We will repeat 2D echocardiogram Optimize blood pressure control  ESRD (end stage renal disease) ([place]) Patient was previously on peritoneal dialysis which he was unable to tolerate due to multiple abdominal hernias with plans to transition to hemodialysis until he has hernia repair. Status post tunneled catheter placement on 10/10/21 We will consult nephrology for renal  replacement therapy    ATRIAL FIBRILLATION Continue carvedilol for rate control Continue apixaban as primary prophylaxis for an acute stroke      Advance Care Planning:   Code Status: Full Code   Consults: Nephrology  Family Communication: Greater than 50% of time was spent discussing patient's condition and plan of care with him and his wife at the bedside.  All questions and concerns have been addressed.  They verbalized understanding and agree with the plan.  Severity of Illness: The appropriate patient status for this patient is OBSERVATION. Observation status is judged to be reasonable and necessary in order to provide the required intensity of service to ensure the patient's safety. The patient's presenting symptoms, physical exam findings, and initial radiographic and laboratory data in the context of their medical condition is felt to place them at decreased risk for further clinical deterioration. Furthermore, it is anticipated that the patient will be medically stable for discharge from the hospital within 2 midnights of admission.   Author: Collier Bullock, MD 10/11/2021 11:22 AM  For on call review [email or web address].

## 2021-10-11 NOTE — ED Notes (Signed)
Patient is in bed with wife at bedside. Patient takes eliquis for Afib.

## 2021-10-11 NOTE — Assessment & Plan Note (Signed)
Patient was previously on peritoneal dialysis which he was unable to tolerate due to multiple abdominal hernias with plans to transition to hemodialysis until he has hernia repair. Status post tunneled catheter placement on 10/10/21 We will consult nephrology for renal replacement therapy

## 2021-10-11 NOTE — ED Triage Notes (Signed)
Pt presents via POV with complaints of SOB - had a dialysis cath insertion yesterday and woke up this AM with increased SOB. Pt stated that he hasn't had dialysis in the last 10 days due to the pending procedure that was scheduled for today. Respirations are equal and unlabored pt endorses pain when taking deep breaths and laying flat. Denies CP, fevers, chills, N/V.

## 2021-10-12 DIAGNOSIS — I482 Chronic atrial fibrillation, unspecified: Secondary | ICD-10-CM | POA: Diagnosis not present

## 2021-10-12 DIAGNOSIS — D631 Anemia in chronic kidney disease: Secondary | ICD-10-CM | POA: Diagnosis not present

## 2021-10-12 DIAGNOSIS — N2581 Secondary hyperparathyroidism of renal origin: Secondary | ICD-10-CM | POA: Diagnosis not present

## 2021-10-12 DIAGNOSIS — Z992 Dependence on renal dialysis: Secondary | ICD-10-CM | POA: Diagnosis not present

## 2021-10-12 DIAGNOSIS — N186 End stage renal disease: Secondary | ICD-10-CM | POA: Diagnosis not present

## 2021-10-12 DIAGNOSIS — E8770 Fluid overload, unspecified: Secondary | ICD-10-CM | POA: Diagnosis not present

## 2021-10-12 DIAGNOSIS — I5033 Acute on chronic diastolic (congestive) heart failure: Secondary | ICD-10-CM | POA: Diagnosis not present

## 2021-10-12 LAB — ECHOCARDIOGRAM COMPLETE
AR max vel: 1.76 cm2
AV Area VTI: 1.97 cm2
AV Area mean vel: 1.78 cm2
AV Mean grad: 7.3 mmHg
AV Peak grad: 13.2 mmHg
Ao pk vel: 1.82 m/s
Height: 70 in
S' Lateral: 3.1 cm
Weight: 2878.4 oz

## 2021-10-12 LAB — CBC
HCT: 28.5 % — ABNORMAL LOW (ref 39.0–52.0)
Hemoglobin: 9.2 g/dL — ABNORMAL LOW (ref 13.0–17.0)
MCH: 33 pg (ref 26.0–34.0)
MCHC: 32.3 g/dL (ref 30.0–36.0)
MCV: 102.2 fL — ABNORMAL HIGH (ref 80.0–100.0)
Platelets: 175 10*3/uL (ref 150–400)
RBC: 2.79 MIL/uL — ABNORMAL LOW (ref 4.22–5.81)
RDW: 13.1 % (ref 11.5–15.5)
WBC: 4.5 10*3/uL (ref 4.0–10.5)
nRBC: 0 % (ref 0.0–0.2)

## 2021-10-12 LAB — BASIC METABOLIC PANEL
Anion gap: 6 (ref 5–15)
BUN: 32 mg/dL — ABNORMAL HIGH (ref 8–23)
CO2: 29 mmol/L (ref 22–32)
Calcium: 8.8 mg/dL — ABNORMAL LOW (ref 8.9–10.3)
Chloride: 105 mmol/L (ref 98–111)
Creatinine, Ser: 3.06 mg/dL — ABNORMAL HIGH (ref 0.61–1.24)
GFR, Estimated: 20 mL/min — ABNORMAL LOW (ref >60)
Glucose, Bld: 89 mg/dL (ref 70–99)
Potassium: 3.6 mmol/L (ref 3.5–5.1)
Sodium: 140 mmol/L (ref 135–145)

## 2021-10-12 LAB — HEPATITIS B SURFACE ANTIBODY, QUANTITATIVE: Hep B S AB Quant (Post): >1000 m[IU]/mL (ref >9.9)

## 2021-10-12 LAB — HEPATITIS B SURFACE ANTIGEN

## 2021-10-12 LAB — HEPATITIS B SURFACE ANTIBODY,QUALITATIVE

## 2021-10-12 LAB — HEPATITIS B CORE ANTIBODY, TOTAL

## 2021-10-12 MED ORDER — HEPARIN SODIUM (PORCINE) 1000 UNIT/ML IJ SOLN
INTRAMUSCULAR | Status: AC
  Filled 2021-10-12: qty 10

## 2021-10-12 NOTE — Progress Notes (Signed)
PT completed 3.5 hrs of HD, tolerated well with no issue  UF = 2519m Report given to floor RN    10/12/21 1154  Vitals  Temp 98.2 F (36.8 C)  Temp Source Oral  BP (!) 153/94  MAP (mmHg) 101  BP Location Right Arm  BP Method Automatic  Patient Position (if appropriate) Lying  Pulse Rate 81  Pulse Rate Source Monitor  ECG Heart Rate 86  Resp (!) 22  Oxygen Therapy  SpO2 100 %  O2 Device Room Air  Patient Activity (if Appropriate) In bed  Pulse Oximetry Type Continuous

## 2021-10-12 NOTE — Discharge Summary (Signed)
Lawrence Perkins. UTM:[id number] DOB: 11-02-1940 DOA: 10/11/2021  PCP: Kirk Ruths, MD  Admit date: 10/11/2021 Discharge date: 10/12/2021 Admitted From: home Disposition:  home  Recommendations for Outpatient Follow-up:  Follow up with PCP in 1 week Please obtain BMP/CBC in one week     Discharge Condition:Stable CODE STATUS:full Diet recommendation: renal   Brief/Interim Summary: Per [facility]:LEXNTZG R Lawrence Perkins. is a 81 y.o. 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 Patient has end-stage renal disease and was on peritoneal dialysis but unable to tolerate it due to several abdominal hernias that need to be fixed.  He was scheduled to transition to hemodialysis and is status post a tunneled catheter placement on 10/10/21.  Patient's last peritoneal dialysis treatment was on 10/01/21.  He notes shortness of breath mostly with exertion worse on the day of admission associated with orthopnea.  He denies having any lower extremity swelling.  Patient continues to void and is on torsemide 40 mg daily which she has been taking as recommended. He was noted to have significantly elevated blood pressure upon arrival to the ER and chest x-ray showed symmetric pulmonary vascular congestion and small bilateral pleural effusions which are new or increased since March.  Nephrology was consulted.  Vascular surgery was also consulted.  Status post permacath was placed by vascular.  He was started on hemodialysis.  Became more euvolemic.  Had a outpatient hemodialysis spot and was cleared by nephrology to be discharged home.  He will follow-up with dialysis TTS at [facility].   Anemia due to chronic kidney  disease H&H is stable Follow-up as outpatient with PCP for close monitoring     Acute on chronic diastolic CHF (congestive heart failure) ([place]) Patient presents for evaluation of worsening shortness of breath associated with orthopnea. Has not had his renal replacement therapy in about 10 days Nephrology consult for renal replacement therapy to optimize volume status Echo revealed normal EF His symptoms improved with hemodialysis he received during his hospitalization     ESRD (end stage renal disease) ([place]) Patient was previously on peritoneal dialysis which he was unable to tolerate due to multiple abdominal hernias with plans to transition to hemodialysis until he has hernia repair. Status post tunneled catheter placement on 10/10/21 Nephrology was consulted.  He received multiple hemodialysis and once improved he was discharged home    TTS dialysis at [facility].     ATRIAL FIBRILLATION Continue with beta-blockers and Eliquis     Discharge Diagnoses:  Active Problems:   ATRIAL FIBRILLATION   ESRD (end stage renal disease) ([facility])   Acute on chronic diastolic CHF (congestive heart failure) ([facility])   Anemia due to chronic kidney disease    Discharge Instructions  Discharge Instructions     Diet - low sodium heart healthy   Complete by: As directed    Discharge instructions   Complete by: As directed    Follow up with dialysis on Saturday as instructed   Discharge wound care:   Complete by: As directed    As above   Increase activity slowly   Complete by: As directed       Allergies as of 10/12/2021   No Known Allergies      Medication List     TAKE these  medications    apixaban 2.5 MG Tabs tablet Commonly known as: ELIQUIS Take 2.5 mg by mouth 2 (two) times daily.   B-COMPLEX/B-12 PO Take 1,000 mcg by mouth.   calcitRIOL 0.25 MCG capsule Commonly known as: ROCALTROL Take 0.25 mcg by mouth daily.   carvedilol 3.125 MG tablet Commonly known  as: COREG Take 3.125 mg by mouth 2 (two) times daily with a meal.   cholecalciferol 25 MCG (1000 UNIT) tablet Commonly known as: VITAMIN D3 Take 1,000 Units by mouth daily.   multivitamin with minerals Tabs tablet Take 1 tablet by mouth daily.   rosuvastatin 10 MG tablet Commonly known as: CRESTOR Take 10 mg by mouth at bedtime.   senna-docusate 8.6-50 MG tablet Commonly known as: Senokot-S Take 1 tablet by mouth daily.   tamsulosin 0.4 MG Caps capsule Commonly known as: FLOMAX Take 1 capsule (0.4 mg total) by mouth daily.   torsemide 20 MG tablet Commonly known as: DEMADEX Take 2 tablets (40 mg total) by mouth daily.   traZODone 50 MG tablet Commonly known as: DESYREL Take by mouth.               Discharge Care Instructions  (From admission, onward)           Start     Ordered   10/12/21 0000  Discharge wound care:       Comments: As above   10/12/21 1319            No Known Allergies  Consultations: Vascular and  nephrology   Procedures/Studies: ECHOCARDIOGRAM COMPLETE  Result Date: 10/12/2021    ECHOCARDIOGRAM REPORT   Patient Name:   Lawrence Perkins. Date of Exam: 10/11/2021 Medical Rec #:  [id number]           Height:       70.0 in Accession #:    [id number]          Weight:       179.9 lb Date of Birth:  12-25-1940          BSA:          1.995 m Patient Age:    81 years            BP:           161/99 mmHg Patient Gender: M                   HR:           73 bpm. Exam Location:  [facility] Procedure: 2D Echo, Cardiac Doppler and Color Doppler Indications:     P71.06 Acute Diastolic CHF  History:         Patient has no prior history of Echocardiogram examinations.                  Cardiomyopathy and CHF, CAD, Arrythmias:Atrial Fibrillation,                  Signs/Symptoms:Syncope; Risk Factors:Hypertension and                  Dyslipidemia. Chronic Kidney Disease. COVID-19. Pleural                  Effusion.  Sonographer:     Cresenciano Lick  RDCS Referring Phys:  [id number] IOEVOJJK AGBATA Diagnosing Phys: Kate Sable MD IMPRESSIONS  1. Left ventricular ejection fraction, by estimation, is 60 to 65%. The left ventricle has normal function. The left ventricle has  no regional wall motion abnormalities. There is mild left ventricular hypertrophy. Left ventricular diastolic parameters are indeterminate.  2. Right ventricular systolic function is normal. The right ventricular size is mildly enlarged. There is moderately elevated pulmonary artery systolic pressure. The estimated right ventricular systolic pressure is 48.2 mmHg.  3. Left atrial size was severely dilated.  4. Right atrial size was mildly dilated.  5. The mitral valve is grossly normal. Mild mitral valve regurgitation.  6. Tricuspid valve regurgitation is mild to moderate.  7. The aortic valve is tricuspid. Aortic valve regurgitation is mild to moderate. Aortic valve sclerosis/calcification is present, without any evidence of aortic stenosis.  8. The inferior vena cava is dilated in size with >50% respiratory variability, suggesting right atrial pressure of 8 mmHg. FINDINGS  Left Ventricle: Left ventricular ejection fraction, by estimation, is 60 to 65%. The left ventricle has normal function. The left ventricle has no regional wall motion abnormalities. The left ventricular internal cavity size was normal in size. There is  mild left ventricular hypertrophy. Left ventricular diastolic parameters are indeterminate. Right Ventricle: The right ventricular size is mildly enlarged. No increase in right ventricular wall thickness. Right ventricular systolic function is normal. There is moderately elevated pulmonary artery systolic pressure. The tricuspid regurgitant velocity is 3.14 m/s, and with an assumed right atrial pressure of 8 mmHg, the estimated right ventricular systolic pressure is 50.0 mmHg. Left Atrium: Left atrial size was severely dilated. Right Atrium: Right atrial size was mildly  dilated. Pericardium: There is no evidence of pericardial effusion. Mitral Valve: The mitral valve is grossly normal. Mild mitral annular calcification. Mild mitral valve regurgitation. Tricuspid Valve: The tricuspid valve is normal in structure. Tricuspid valve regurgitation is mild to moderate. Aortic Valve: The aortic valve is tricuspid. Aortic valve regurgitation is mild to moderate. Aortic valve sclerosis/calcification is present, without any evidence of aortic stenosis. Aortic valve mean gradient measures 7.2 mmHg. Aortic valve peak gradient measures 13.2 mmHg. Aortic valve area, by VTI measures 1.97 cm. Pulmonic Valve: The pulmonic valve was not well visualized. Pulmonic valve regurgitation is not visualized. Aorta: The aortic root and ascending aorta are structurally normal, with no evidence of dilitation. Venous: The inferior vena cava is dilated in size with greater than 50% respiratory variability, suggesting right atrial pressure of 8 mmHg. IAS/Shunts: No atrial level shunt detected by color flow Doppler.  LEFT VENTRICLE PLAX 2D LVIDd:         5.10 cm LVIDs:         3.10 cm LV PW:         1.20 cm LV IVS:        1.10 cm LVOT diam:     2.00 cm LV SV:         71 LV SV Index:   35 LVOT Area:     3.14 cm  RIGHT VENTRICLE             IVC RV Basal diam:  4.90 cm     IVC diam: 2.30 cm RV S prime:     17.80 cm/s TAPSE (M-mode): 2.0 cm LEFT ATRIUM              Index        RIGHT ATRIUM           Index LA diam:        6.00 cm  3.01 cm/m   RA Area:     22.20 cm LA Vol (A2C):   104.0 ml 52.12  ml/m  RA Volume:   73.70 ml  36.93 ml/m LA Vol (A4C):   125.0 ml 62.64 ml/m LA Biplane Vol: 114.0 ml 57.13 ml/m  AORTIC VALVE AV Area (Vmax):    1.76 cm AV Area (Vmean):   1.78 cm AV Area (VTI):     1.97 cm AV Vmax:           181.75 cm/s AV Vmean:          122.250 cm/s AV VTI:            0.358 m AV Peak Grad:      13.2 mmHg AV Mean Grad:      7.2 mmHg LVOT Vmax:         101.70 cm/s LVOT Vmean:        69.300 cm/s LVOT  VTI:          0.225 m LVOT/AV VTI ratio: 0.63  AORTA Ao Root diam: 3.50 cm Ao Asc diam:  3.70 cm MV E velocity: 104.37 cm/s  TRICUSPID VALVE                             TR Peak grad:   39.4 mmHg                             TR Vmax:        314.00 cm/s                              SHUNTS                             Systemic VTI:  0.22 m                             Systemic Diam: 2.00 cm Kate Sable MD Electronically signed by Kate Sable MD Signature Date/Time: 10/12/2021/1:17:26 PM    Final    DG Chest 1 View  Result Date: 10/11/2021 CLINICAL DATA:  81 year old male with shortness of breath. Dialysis catheter placement yesterday. EXAM: CHEST  1 VIEW COMPARISON:  Portable chest 03/27/2021 and earlier. FINDINGS: Portable AP upright view at 0537 hours. Right chest dual lumen tunneled type dialysis catheter in place, similar to that in March. Small bilateral pleural effusions are apparent, and new or increased since March. Stable cardiac size and mediastinal contours. Visualized tracheal air column is within normal limits. Similar pulmonary vascular congestion, symmetric. No pneumothorax or consolidation. No acute osseous abnormality identified. Paucity of bowel gas in the upper abdomen. IMPRESSION: 1. Symmetric pulmonary vascular congestion and small bilateral pleural effusions which are new or increased since March. Consider mild or developing interstitial edema. 2. Right chest dialysis catheter in place. Electronically Signed   By: Genevie Ann M.D.   On: 10/11/2021 06:16   PERIPHERAL VASCULAR CATHETERIZATION  Result Date: 10/10/2021 See surgical note for result.  CT Abdomen Pelvis Wo Contrast  Result Date: 09/23/2021 CLINICAL DATA:  Acute abdominal pain. EXAM: CT ABDOMEN AND PELVIS WITHOUT CONTRAST TECHNIQUE: Multidetector CT imaging of the abdomen and pelvis was performed following the standard protocol without IV contrast. RADIATION DOSE REDUCTION: This exam was performed according to the  departmental dose-optimization program which includes automated exposure control, adjustment of the mA and/or kV according to patient size  and/or use of iterative reconstruction technique. COMPARISON:  None Available. FINDINGS: Lower chest: There is a small right pleural effusion with atelectasis in the right lung base. There is a calcified granuloma in the right lung base. Hepatobiliary: No focal liver abnormality is seen. No gallstones, gallbladder wall thickening, or biliary dilatation. Pancreas: Unremarkable. No pancreatic ductal dilatation or surrounding inflammatory changes. Spleen: Normal in size without focal abnormality. Adrenals/Urinary Tract: Evaluation of distal ureters and bladder limited secondary to streak artifact in the pelvis. The bladder is grossly within normal limits. There is no hydronephrosis or perinephric fluid. Bilateral renal cysts are present measuring up to 17 mm. There is a punctate nonobstructing right renal calculus. The adrenal glands are within normal limits. Stomach/Bowel: Stomach is within normal limits. No evidence of bowel wall thickening, distention, or inflammatory changes. The appendix is not seen. There is colonic diverticulosis. Vascular/Lymphatic: Aortic atherosclerosis. No enlarged abdominal or pelvic lymph nodes. Reproductive: Prostate gland not well evaluated secondary to streak artifact in the pelvis. Prostate is likely enlarged and contains calcifications. Other: Percutaneous abdominal catheter is seen entering the mid anterior abdomen. There is some stranding and fluid in the subcutaneous tissue surrounding this catheter as it enters the peritoneal cavity. The catheter appears intact with distal tip in the right lower quadrant. There is a small amount of free air in the right upper quadrant which may be related to catheter placement. There is no ascites or intra-abdominal fluid collection. There is scattered subcutaneous air in the right anterior abdominal wall.  Musculoskeletal: Multilevel degenerative changes affect the spine. Bilateral hip arthroplasties are present. IMPRESSION: 1. Small amount of free air in the upper abdomen presumably from intraperitoneal catheter. Please correlate clinically. Bowel perforation is also in the differential in the appropriate clinical setting. 2. Mild subcutaneous edema surrounding the catheter as it enters the abdominal wall. 3. Subcutaneous abdominal wall air in the right abdomen, nonspecific. Correlate clinically. 4. Small right pleural effusion. 5. Colonic diverticulosis without evidence for diverticulitis. 6.  Aortic Atherosclerosis (ICD10-I70.0). 7. Nonobstructing right renal calculus. 8. 1.7 cm Bosniak 2 benign cyst. No follow-up imaging is recommended. JACR 2018 Feb; 264-273, Management of the Incidental RenalMass on CT, [employer] 2021; [phone], Bosniak Classification of Cystic Renal Masses, Version 2019. Electronically Signed   By: Ronney Asters M.D.   On: 09/23/2021 16:24      Subjective:  No sob, or cop Discharge Exam: Vitals:   10/12/21 1154 10/12/21 1203  BP: (!) 153/94 (!) 159/94  Pulse: 81 84  Resp: (!) 22 (!) 21  Temp: 98.2 F (36.8 C)   SpO2: 100% 100%   Vitals:   10/12/21 1100 10/12/21 1130 10/12/21 1154 10/12/21 1203  BP: (!) 163/100 (!) 163/89 (!) 153/94 (!) 159/94  Pulse: 64 62 81 84  Resp: 16 (!) 21 (!) 22 (!) 21  Temp:   98.2 F (36.8 C)   TempSrc:   Oral   SpO2: 95% 98% 100% 100%  Weight:    76.7 kg  Height:        General: Pt is alert, awake, not in acute distress Cardiovascular: RRR, S1/S2 +, no rubs, no gallops Respiratory: CTA bilaterally, no wheezing, no rhonchi Abdominal: Soft, NT, ND, bowel sounds + Extremities: no edema, no cyanosis    The results of significant diagnostics from this hospitalization (including imaging, microbiology, ancillary and laboratory) are listed below for reference.     Microbiology: No results found for this or any previous visit (from  the past 240 hour(s)).  Labs: BNP (last 3 results) Recent Labs    10/11/21 0521  BNP 026.3*   Basic Metabolic Panel: Recent Labs  Lab 10/11/21 0521 10/12/21 0428  NA 142 140  K 3.9 3.6  CL 107 105  CO2 28 29  GLUCOSE 93 89  BUN 47* 32*  CREATININE 3.95* 3.06*  CALCIUM 9.8 8.8*   Liver Function Tests: No results for input(s): "AST", "ALT", "ALKPHOS", "BILITOT", "PROT", "ALBUMIN" in the last 168 hours. No results for input(s): "LIPASE", "AMYLASE" in the last 168 hours. No results for input(s): "AMMONIA" in the last 168 hours. CBC: Recent Labs  Lab 10/11/21 0521 10/12/21 0428  WBC 4.9 4.5  HGB 10.3* 9.2*  HCT 31.7* 28.5*  MCV 102.9* 102.2*  PLT 216 175   Cardiac Enzymes: No results for input(s): "CKTOTAL", "CKMB", "CKMBINDEX", "TROPONINI" in the last 168 hours. BNP: Invalid input(s): "POCBNP" CBG: Recent Labs  Lab 10/11/21 0808  GLUCAP 84   D-Dimer No results for input(s): "DDIMER" in the last 72 hours. Hgb A1c No results for input(s): "HGBA1C" in the last 72 hours. Lipid Profile No results for input(s): "CHOL", "HDL", "LDLCALC", "TRIG", "CHOLHDL", "LDLDIRECT" in the last 72 hours. Thyroid function studies No results for input(s): "TSH", "T4TOTAL", "T3FREE", "THYROIDAB" in the last 72 hours.  Invalid input(s): "FREET3" Anemia work up No results for input(s): "VITAMINB12", "FOLATE", "FERRITIN", "TIBC", "IRON", "RETICCTPCT" in the last 72 hours. Urinalysis    Component Value Date/Time   COLORURINE YELLOW 09/23/2021 1502   APPEARANCEUR CLEAR 09/23/2021 1502   APPEARANCEUR Clear 06/06/2021 1124   LABSPEC 1.010 09/23/2021 1502   LABSPEC 1.011 02/04/2014 1449   PHURINE 5.5 09/23/2021 1502   GLUCOSEU NEGATIVE 09/23/2021 1502   GLUCOSEU Negative 02/04/2014 1449   HGBUR MODERATE (A) 09/23/2021 1502   BILIRUBINUR NEGATIVE 09/23/2021 1502   BILIRUBINUR Negative 06/06/2021 1124   BILIRUBINUR Negative 02/04/2014 1449   KETONESUR NEGATIVE 09/23/2021 1502    PROTEINUR >300 (A) 09/23/2021 1502   NITRITE NEGATIVE 09/23/2021 1502   LEUKOCYTESUR NEGATIVE 09/23/2021 1502   LEUKOCYTESUR Negative 02/04/2014 1449   Sepsis Labs Recent Labs  Lab 10/11/21 0521 10/12/21 0428  WBC 4.9 4.5   Microbiology No results found for this or any previous visit (from the past 240 hour(s)).   Time coordinating discharge: Over 30 minutes  SIGNED:   Nolberto Hanlon, MD  [facility] 10/14/2021, 5:34 PM Pager   If 7PM-7AM, please contact night-coverage [email or web address] Password [email or web address]

## 2021-10-12 NOTE — Progress Notes (Signed)
[facility]  ROUNDING NOTE   Subjective:   Lawrence Perkins. is a 81 year old male with past medical conditions including atrial fibrillation on Eliquis, anemia, systolic CHF, diverticulitis, hypertension, arthritis, BPH, and end-stage renal disease on peritoneal dialysis.  Patient presents to the emergency department with progressive shortness of breath and has been admitted under observation for Acute CHF (congestive heart failure) ([place]) [I50.9]  Patient is known to our practice and is followed outpatient by Dr. Holley Raring at the [facility] home dialysis clinic.  Patient had PD catheter placed on 08/10/2021 and had completed training and begun home use.    Patient seen and evaluated during dialysis   HEMODIALYSIS FLOWSHEET:  Blood Flow Rate (mL/min): 400 mL/min Arterial Pressure (mmHg): -190 mmHg Venous Pressure (mmHg): 200 mmHg TMP (mmHg): 8 mmHg Ultrafiltration Rate (mL/min): 937 mL/min Dialysate Flow Rate (mL/min): 300 ml/min Dialysis Fluid Bolus: Normal Saline  Tolerating treatment well.   Objective:  Vital signs in last 24 hours:  Temp:  [98.3 F (36.8 C)-98.6 F (37 C)] 98.3 F (36.8 C) (10/12 0434) Pulse Rate:  [60-81] 62 (10/12 1130) Resp:  [16-24] 21 (10/12 1130) BP: (148-186)/(67-100) 163/89 (10/12 1130) SpO2:  [89 %-100 %] 98 % (10/12 1130)  Weight change:  Filed Weights   10/11/21 0512  Weight: 81.6 kg    Intake/Output: I/O last 3 completed shifts: In: -  Out: 2000 [Other:2000]   Intake/Output this shift:  No intake/output data recorded.  Physical Exam: General: NAD, resting comfortably  Head: Normocephalic, atraumatic. Moist oral mucosal membranes  Eyes: Anicteric  Lungs:  Diminished in bases, normal effort, room air  Heart: Irregular rate and rhythm  Abdomen:  Soft, nontender, PD catheter  Extremities: No peripheral edema.  Neurologic: Nonfocal, moving all four extremities  Skin: No lesions  Access: PD catheter, right chest  PermCath    Basic Metabolic Panel: Recent Labs  Lab 10/11/21 0521 10/12/21 0428  NA 142 140  K 3.9 3.6  CL 107 105  CO2 28 29  GLUCOSE 93 89  BUN 47* 32*  CREATININE 3.95* 3.06*  CALCIUM 9.8 8.8*     Liver Function Tests: No results for input(s): "AST", "ALT", "ALKPHOS", "BILITOT", "PROT", "ALBUMIN" in the last 168 hours. No results for input(s): "LIPASE", "AMYLASE" in the last 168 hours. No results for input(s): "AMMONIA" in the last 168 hours.  CBC: Recent Labs  Lab 10/11/21 0521 10/12/21 0428  WBC 4.9 4.5  HGB 10.3* 9.2*  HCT 31.7* 28.5*  MCV 102.9* 102.2*  PLT 216 175     Cardiac Enzymes: No results for input(s): "CKTOTAL", "CKMB", "CKMBINDEX", "TROPONINI" in the last 168 hours.  BNP: Invalid input(s): "POCBNP"  CBG: Recent Labs  Lab 10/11/21 0808  GLUCAP 77     Microbiology: Results for orders placed or performed in visit on 06/06/21  Microscopic Examination     Status: Abnormal   Collection Time: 06/06/21 11:24 AM   Urine  Result Value Ref Range Status   WBC, UA 0-5 0 - 5 /hpf Final   RBC, Urine 3-10 (A) 0 - 2 /hpf Final   Epithelial Cells (non renal) 0-10 0 - 10 /hpf Final   Casts Present (A) None seen /lpf Final   Cast Type Granular casts (A) N/A Final   Bacteria, UA None seen None seen/Few Final    Coagulation Studies: No results for input(s): "LABPROT", "INR" in the last 72 hours.  Urinalysis: No results for input(s): "COLORURINE", "LABSPEC", "PHURINE", "GLUCOSEU", "HGBUR", "BILIRUBINUR", "KETONESUR", "PROTEINUR", "  UROBILINOGEN", "NITRITE", "LEUKOCYTESUR" in the last 72 hours.  Invalid input(s): "APPERANCEUR"    Imaging: DG Chest 1 View  Result Date: 10/11/2021 CLINICAL DATA:  81 year old male with shortness of breath. Dialysis catheter placement yesterday. EXAM: CHEST  1 VIEW COMPARISON:  Portable chest 03/27/2021 and earlier. FINDINGS: Portable AP upright view at 0537 hours. Right chest dual lumen tunneled type dialysis  catheter in place, similar to that in March. Small bilateral pleural effusions are apparent, and new or increased since March. Stable cardiac size and mediastinal contours. Visualized tracheal air column is within normal limits. Similar pulmonary vascular congestion, symmetric. No pneumothorax or consolidation. No acute osseous abnormality identified. Paucity of bowel gas in the upper abdomen. IMPRESSION: 1. Symmetric pulmonary vascular congestion and small bilateral pleural effusions which are new or increased since March. Consider mild or developing interstitial edema. 2. Right chest dialysis catheter in place. Electronically Signed   By: Genevie Ann M.D.   On: 10/11/2021 06:16   PERIPHERAL VASCULAR CATHETERIZATION  Result Date: 10/10/2021 See surgical note for result.    Medications:    sodium chloride     anticoagulant sodium citrate      apixaban  2.5 mg Oral BID   B-complex with vitamin C  1 tablet Oral Daily   calcitRIOL  0.25 mcg Oral Daily   carvedilol  3.125 mg Oral BID WC   Chlorhexidine Gluconate Cloth  6 each Topical Q0600   cholecalciferol  1,000 Units Oral Daily   multivitamin with minerals  1 tablet Oral Daily   rosuvastatin  10 mg Oral QHS   senna-docusate  1 tablet Oral Daily   sodium chloride flush  3 mL Intravenous Q12H   tamsulosin  0.4 mg Oral Daily   torsemide  40 mg Oral Daily   traZODone  50 mg Oral QHS   sodium chloride, acetaminophen **OR** acetaminophen, alteplase, anticoagulant sodium citrate, heparin, lidocaine (PF), lidocaine-prilocaine, ondansetron **OR** ondansetron (ZOFRAN) IV, pentafluoroprop-tetrafluoroeth, sodium chloride flush  Assessment/ Plan:  Lawrence Perkins. is a 81 y.o.  male with past medical conditions including atrial fibrillation on Eliquis, anemia, systolic CHF, diverticulitis, hypertension, arthritis, BPH, and end-stage renal disease on peritoneal dialysis.  Patient presents to the emergency department with progressive shortness of  breath and has been admitted under observation for Acute CHF (congestive heart failure) ([place]) [I50.9]  [facility], back up HD  Fluid volume overload with end-stage renal disease requiring dialysis.  Peritoneal dialysis catheter malfunction prior to admission, evaluation schedule outpatient surgery.  Right chest PermCath placed yesterday by vascular surgery to provide backup hemodialysis.    Patient received dialysis today, UF goal 2L as tolerated. Appreciate renal navigator confirming outpatient dialysis chair at [facility] on TTS schedule. Cleared to discharge from renal stance.   2. Anemia of chronic kidney disease Lab Results  Component Value Date   HGB 9.2 (L) 10/12/2021    Hgb at goal  3. Secondary Hyperparathyroidism: with outpatient labs: PTH 54.0, phosphorus 4.7, calcium 9.3 on 10/02/21.   Lab Results  Component Value Date   CALCIUM 8.8 (L) 10/12/2021   CAION 1.21 09/21/2021   PHOS 6.6 (H) 03/25/2021    Will continue to monitor bone minerals  4.  Hypertension with chronic kidney disease.  Home regimen includes carvedilol and torsemide.  Currently receiving these medications.  Blood pressure 159/94 during dialysis.    LOS: 0 Hannalee Castor 10/12/202311:55 AM

## 2021-10-14 DIAGNOSIS — N186 End stage renal disease: Secondary | ICD-10-CM | POA: Diagnosis not present

## 2021-10-14 DIAGNOSIS — Z992 Dependence on renal dialysis: Secondary | ICD-10-CM | POA: Diagnosis not present

## 2021-10-14 LAB — HEPATITIS C ANTIBODY: HCV Ab: NONREACTIVE — AB

## 2021-10-16 DIAGNOSIS — H353132 Nonexudative age-related macular degeneration, bilateral, intermediate dry stage: Secondary | ICD-10-CM | POA: Diagnosis not present

## 2021-10-16 DIAGNOSIS — H2513 Age-related nuclear cataract, bilateral: Secondary | ICD-10-CM | POA: Diagnosis not present

## 2021-10-16 DIAGNOSIS — H2512 Age-related nuclear cataract, left eye: Secondary | ICD-10-CM | POA: Diagnosis not present

## 2021-10-16 DIAGNOSIS — H524 Presbyopia: Secondary | ICD-10-CM | POA: Diagnosis not present

## 2021-10-16 DIAGNOSIS — H25013 Cortical age-related cataract, bilateral: Secondary | ICD-10-CM | POA: Diagnosis not present

## 2021-10-16 DIAGNOSIS — H52213 Irregular astigmatism, bilateral: Secondary | ICD-10-CM | POA: Diagnosis not present

## 2021-10-16 DIAGNOSIS — H35033 Hypertensive retinopathy, bilateral: Secondary | ICD-10-CM | POA: Diagnosis not present

## 2021-10-17 DIAGNOSIS — I4821 Permanent atrial fibrillation: Secondary | ICD-10-CM | POA: Diagnosis not present

## 2021-10-17 DIAGNOSIS — D631 Anemia in chronic kidney disease: Secondary | ICD-10-CM | POA: Diagnosis not present

## 2021-10-17 DIAGNOSIS — Z992 Dependence on renal dialysis: Secondary | ICD-10-CM | POA: Diagnosis not present

## 2021-10-17 DIAGNOSIS — N184 Chronic kidney disease, stage 4 (severe): Secondary | ICD-10-CM | POA: Diagnosis not present

## 2021-10-17 DIAGNOSIS — I129 Hypertensive chronic kidney disease with stage 1 through stage 4 chronic kidney disease, or unspecified chronic kidney disease: Secondary | ICD-10-CM | POA: Diagnosis not present

## 2021-10-17 DIAGNOSIS — I2510 Atherosclerotic heart disease of native coronary artery without angina pectoris: Secondary | ICD-10-CM | POA: Diagnosis not present

## 2021-10-17 DIAGNOSIS — N186 End stage renal disease: Secondary | ICD-10-CM | POA: Diagnosis not present

## 2021-10-17 DIAGNOSIS — D509 Iron deficiency anemia, unspecified: Secondary | ICD-10-CM | POA: Diagnosis not present

## 2021-10-19 DIAGNOSIS — Z992 Dependence on renal dialysis: Secondary | ICD-10-CM | POA: Diagnosis not present

## 2021-10-19 DIAGNOSIS — N186 End stage renal disease: Secondary | ICD-10-CM | POA: Diagnosis not present

## 2021-10-21 DIAGNOSIS — N186 End stage renal disease: Secondary | ICD-10-CM | POA: Diagnosis not present

## 2021-10-21 DIAGNOSIS — Z992 Dependence on renal dialysis: Secondary | ICD-10-CM | POA: Diagnosis not present

## 2021-10-24 DIAGNOSIS — N186 End stage renal disease: Secondary | ICD-10-CM | POA: Diagnosis not present

## 2021-10-24 DIAGNOSIS — Z992 Dependence on renal dialysis: Secondary | ICD-10-CM | POA: Diagnosis not present

## 2021-10-26 DIAGNOSIS — N186 End stage renal disease: Secondary | ICD-10-CM | POA: Diagnosis not present

## 2021-10-26 DIAGNOSIS — Z992 Dependence on renal dialysis: Secondary | ICD-10-CM | POA: Diagnosis not present

## 2021-10-28 DIAGNOSIS — Z992 Dependence on renal dialysis: Secondary | ICD-10-CM | POA: Diagnosis not present

## 2021-10-28 DIAGNOSIS — N186 End stage renal disease: Secondary | ICD-10-CM | POA: Diagnosis not present

## 2021-10-30 ENCOUNTER — Other Ambulatory Visit (INDEPENDENT_AMBULATORY_CARE_PROVIDER_SITE_OTHER): Payer: Self-pay | Admitting: Nurse Practitioner

## 2021-10-30 ENCOUNTER — Encounter (INDEPENDENT_AMBULATORY_CARE_PROVIDER_SITE_OTHER): Payer: Self-pay

## 2021-10-30 DIAGNOSIS — Z992 Dependence on renal dialysis: Secondary | ICD-10-CM | POA: Diagnosis not present

## 2021-10-30 DIAGNOSIS — N186 End stage renal disease: Secondary | ICD-10-CM | POA: Diagnosis not present

## 2021-10-31 DIAGNOSIS — H2512 Age-related nuclear cataract, left eye: Secondary | ICD-10-CM | POA: Diagnosis not present

## 2021-10-31 DIAGNOSIS — H25812 Combined forms of age-related cataract, left eye: Secondary | ICD-10-CM | POA: Diagnosis not present

## 2021-10-31 DIAGNOSIS — N186 End stage renal disease: Secondary | ICD-10-CM | POA: Diagnosis not present

## 2021-10-31 DIAGNOSIS — Z992 Dependence on renal dialysis: Secondary | ICD-10-CM | POA: Diagnosis not present

## 2021-11-01 ENCOUNTER — Ambulatory Visit (INDEPENDENT_AMBULATORY_CARE_PROVIDER_SITE_OTHER): Payer: Medicare HMO

## 2021-11-01 ENCOUNTER — Encounter (INDEPENDENT_AMBULATORY_CARE_PROVIDER_SITE_OTHER): Payer: Self-pay | Admitting: Nurse Practitioner

## 2021-11-01 ENCOUNTER — Ambulatory Visit (INDEPENDENT_AMBULATORY_CARE_PROVIDER_SITE_OTHER): Payer: Medicare HMO | Admitting: Nurse Practitioner

## 2021-11-01 VITALS — BP 124/70 | HR 60 | Resp 16 | Wt 181.8 lb

## 2021-11-01 DIAGNOSIS — N183 Chronic kidney disease, stage 3 unspecified: Secondary | ICD-10-CM | POA: Diagnosis not present

## 2021-11-01 DIAGNOSIS — Z992 Dependence on renal dialysis: Secondary | ICD-10-CM

## 2021-11-01 DIAGNOSIS — I129 Hypertensive chronic kidney disease with stage 1 through stage 4 chronic kidney disease, or unspecified chronic kidney disease: Secondary | ICD-10-CM

## 2021-11-01 DIAGNOSIS — N186 End stage renal disease: Secondary | ICD-10-CM | POA: Diagnosis not present

## 2021-11-02 DIAGNOSIS — N186 End stage renal disease: Secondary | ICD-10-CM | POA: Diagnosis not present

## 2021-11-02 DIAGNOSIS — Z992 Dependence on renal dialysis: Secondary | ICD-10-CM | POA: Diagnosis not present

## 2021-11-04 DIAGNOSIS — N186 End stage renal disease: Secondary | ICD-10-CM | POA: Diagnosis not present

## 2021-11-04 DIAGNOSIS — Z992 Dependence on renal dialysis: Secondary | ICD-10-CM | POA: Diagnosis not present

## 2021-11-06 DIAGNOSIS — E7800 Pure hypercholesterolemia, unspecified: Secondary | ICD-10-CM | POA: Diagnosis not present

## 2021-11-06 DIAGNOSIS — Z9089 Acquired absence of other organs: Secondary | ICD-10-CM | POA: Diagnosis not present

## 2021-11-06 DIAGNOSIS — K4020 Bilateral inguinal hernia, without obstruction or gangrene, not specified as recurrent: Secondary | ICD-10-CM | POA: Diagnosis not present

## 2021-11-06 DIAGNOSIS — I4821 Permanent atrial fibrillation: Secondary | ICD-10-CM | POA: Diagnosis not present

## 2021-11-06 DIAGNOSIS — T85691A Other mechanical complication of intraperitoneal dialysis catheter, initial encounter: Secondary | ICD-10-CM | POA: Diagnosis not present

## 2021-11-06 DIAGNOSIS — Z4902 Encounter for fitting and adjustment of peritoneal dialysis catheter: Secondary | ICD-10-CM | POA: Diagnosis not present

## 2021-11-06 DIAGNOSIS — I120 Hypertensive chronic kidney disease with stage 5 chronic kidney disease or end stage renal disease: Secondary | ICD-10-CM | POA: Diagnosis not present

## 2021-11-06 DIAGNOSIS — I509 Heart failure, unspecified: Secondary | ICD-10-CM | POA: Diagnosis not present

## 2021-11-06 DIAGNOSIS — Z79899 Other long term (current) drug therapy: Secondary | ICD-10-CM | POA: Diagnosis not present

## 2021-11-06 DIAGNOSIS — I2510 Atherosclerotic heart disease of native coronary artery without angina pectoris: Secondary | ICD-10-CM | POA: Diagnosis not present

## 2021-11-06 DIAGNOSIS — Z7901 Long term (current) use of anticoagulants: Secondary | ICD-10-CM | POA: Diagnosis not present

## 2021-11-06 DIAGNOSIS — N186 End stage renal disease: Secondary | ICD-10-CM | POA: Diagnosis not present

## 2021-11-06 DIAGNOSIS — Z992 Dependence on renal dialysis: Secondary | ICD-10-CM | POA: Diagnosis not present

## 2021-11-06 DIAGNOSIS — E785 Hyperlipidemia, unspecified: Secondary | ICD-10-CM | POA: Diagnosis not present

## 2021-11-06 DIAGNOSIS — I132 Hypertensive heart and chronic kidney disease with heart failure and with stage 5 chronic kidney disease, or end stage renal disease: Secondary | ICD-10-CM | POA: Diagnosis not present

## 2021-11-07 DIAGNOSIS — N186 End stage renal disease: Secondary | ICD-10-CM | POA: Diagnosis not present

## 2021-11-07 DIAGNOSIS — Z992 Dependence on renal dialysis: Secondary | ICD-10-CM | POA: Diagnosis not present

## 2021-11-08 DIAGNOSIS — Z992 Dependence on renal dialysis: Secondary | ICD-10-CM | POA: Diagnosis not present

## 2021-11-08 DIAGNOSIS — N186 End stage renal disease: Secondary | ICD-10-CM | POA: Diagnosis not present

## 2021-11-09 DIAGNOSIS — Z992 Dependence on renal dialysis: Secondary | ICD-10-CM | POA: Diagnosis not present

## 2021-11-09 DIAGNOSIS — N186 End stage renal disease: Secondary | ICD-10-CM | POA: Diagnosis not present

## 2021-11-09 DIAGNOSIS — H2512 Age-related nuclear cataract, left eye: Secondary | ICD-10-CM | POA: Diagnosis not present

## 2021-11-12 ENCOUNTER — Encounter (INDEPENDENT_AMBULATORY_CARE_PROVIDER_SITE_OTHER): Payer: Self-pay | Admitting: Nurse Practitioner

## 2021-11-12 NOTE — Progress Notes (Signed)
Subjective:    Patient ID: Lawrence Song., male    DOB: [birth year], 81 y.o.   MRN: [id number] Chief Complaint  Patient presents with   Follow-up    Ultrasound follow up    Lawrence Hamad. is an 81 year old male referred by Dr. Zollie Scale for CVC catheter placement.  The patient notes that he is currently maintained via peritoneal dialysis catheter but recently began to have issues and began to experience scrotal swelling.  Evaluation found the patient had a hernia and required repair of this in addition to revision of his peritoneal dialysis catheter.  Because of this the patient will need to be on hemodialysis for 4 to 6 weeks while he heals.  The patient had this placed on 10/10/2021.  It has been working without difficulty.  The patient has been referred again by his nephrologist for consideration of a permanent dialysis access as a backup to peritoneal dialysis.  The patient is debating on switching from he will do peritoneal dialysis but he is not completely sure at this time.  Based on noninvasive studies today the patient has adequate access in either upper extremity for brachiocephalic AV fistula.  The patient is left-hand dominant.     Review of Systems     Objective:   Physical Exam  BP 124/70 (BP Location: Right Arm)   Pulse 60   Resp 16   Wt 181 lb 12.8 oz (82.5 kg)   BMI 26.09 kg/m   Past Medical History:  Diagnosis Date   Anemia    Aortic atherosclerosis (HCC)    Arthritis    Atrial fibrillation and flutter (HCC)    a.) CHA2DS2-VASc = 5 (age x 2, CHF, HTN, aortic plaque). b.) rate/rhythm maintain on oral carvedilol; chronically anticoagulated with dose reduced apixaban   Bilateral inguinal hernia    BPH (benign prostatic hyperplasia)    Cardiac syncope    Cardiomyopathy ([place])    a.) TTE 04/23/2016: EF 40%. b.) TTE 09/03/2016: EF 50%. c.) TTE 05/04/2019: EF >55%.   CHF (congestive heart failure) ([employer])    a.) TTE 04/23/2016: EF 40%; BAE; mild AR/TR, mod MR;  G1DD. b.) TTE 09/03/2016: EF 50%; mild LVH; BAE; triv AR/PR, mod MR/TR. c.) TTE 05/04/2019: EF >55%; mod LVH, mild LA enlargement; mild AR/MR/TR   CKD (chronic kidney disease), stage V (HCC)    Complication of anesthesia    a.) h/o intra/postoperative A.fib   Coronary artery disease    Diverticulosis    Gout    History of 2019 novel coronavirus disease (COVID-19) 11/14/2020   History of colon polyps    Hypercholesteremia    Hypertension    Long term current use of anticoagulant    a.) dose reduced apixaban; dose reduction d/t to age and CKD Dx.   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[place])    a.) s/p resection   MGUS (monoclonal gammopathy of unknown significance)    Pleural effusion    Rectal bleeding    Secondary hyperparathyroidism of renal origin [facility])     Social History   Socioeconomic History   Marital status: Married    Spouse name: [name]   Number of children: Not on file   Years of education: Not on file   Highest education level: Not on file  Occupational History   Occupation: Retired  Tobacco Use   Smoking status: Former    Years: 15.00    Types: Cigarettes    Quit date: 01/01/1981    Years since quitting: 10.8  Smokeless tobacco: Never  Vaping Use   Vaping Use: Never used  Substance and Sexual Activity   Alcohol use: Not Currently   Drug use: No   Sexual activity: Yes    Birth control/protection: None  Other Topics Concern   Not on file  Social History Narrative   Regular exercise: Yes   Social Determinants of Health   Financial Resource Strain: Not on file  Food Insecurity: Not on file  Transportation Needs: Not on file  Physical Activity: Not on file  Stress: Not on file  Social Connections: Not on file  Intimate Partner Violence: Not on file    Past Surgical History:  Procedure Laterality Date   ANKLE SURGERY Right 1985   following volleyball injury   APPENDECTOMY     at the of 9 yrs   CAPD INSERTION N/A 08/10/2021   Procedure: LAPAROSCOPIC INSERTION  CONTINUOUS AMBULATORY PERITONEAL DIALYSIS  (CAPD) CATHETER;  Surgeon: Jules Husbands, MD;  Location: [facility] ORS;  Service: General;  Laterality: N/A;   CAPD REVISION N/A 09/21/2021   Procedure: LAPAROSCOPIC REVISION CONTINUOUS AMBULATORY PERITONEAL DIALYSIS  (CAPD) CATHETER;  Surgeon: Jules Husbands, MD;  Location: [facility] ORS;  Service: General;  Laterality: N/A;   COLONOSCOPY     COLONOSCOPY WITH PROPOFOL N/A 01/02/2017   Procedure: COLONOSCOPY WITH PROPOFOL;  Surgeon: Manya Silvas, MD;  Location: [facility] ENDOSCOPY;  Service: Endoscopy;  Laterality: N/A;   DIALYSIS/PERMA CATHETER INSERTION N/A 03/27/2021   Procedure: DIALYSIS/PERMA CATHETER INSERTION;  Surgeon: Algernon Huxley, MD;  Location: [place] CV LAB;  Service: Cardiovascular;  Laterality: N/A;   DIALYSIS/PERMA CATHETER INSERTION N/A 10/10/2021   Procedure: DIALYSIS/PERMA CATHETER INSERTION;  Surgeon: Katha Cabal, MD;  Location: [place] [facility] LAB;  Service: Cardiovascular;  Laterality: N/A;   DIALYSIS/PERMA CATHETER REMOVAL N/A 05/08/2021   Procedure: DIALYSIS/PERMA CATHETER REMOVAL;  Surgeon: Algernon Huxley, MD;  Location: [place] CV LAB;  Service: Cardiovascular;  Laterality: N/A;   INSERTION OF MESH  08/10/2021   Procedure: INSERTION OF MESH;  Surgeon: Jules Husbands, MD;  Location: [facility] ORS;  Service: General;;   JOINT REPLACEMENT     RIGHT TOTAL HIP   KNEE ARTHROSCOPY WITH LATERAL RELEASE Right 04/20/2021   Procedure: Right knee medial retinaculum and lateral release with polyethylene exchange;  Surgeon: Hessie Knows, MD;  Location: [facility] ORS;  Service: Orthopedics;  Laterality: Right;   TONSILLECTOMY     as a child   TOTAL HIP ARTHROPLASTY  09/2009   Right femoral neck fracture   TOTAL HIP ARTHROPLASTY Left 03/06/2016   Procedure: TOTAL HIP ARTHROPLASTY ANTERIOR APPROACH;  Surgeon: Hessie Knows, MD;  Location: [facility] ORS;  Service: Orthopedics;  Laterality: Left;   TOTAL KNEE ARTHROPLASTY Right 03/21/2021    Procedure: TOTAL KNEE ARTHROPLASTY;  Surgeon: Hessie Knows, MD;  Location: [facility] ORS;  Service: Orthopedics;  Laterality: Right;   UMBILICAL HERNIA REPAIR  08/10/2021   Procedure: HERNIA REPAIR UMBILICAL ADULT;  Surgeon: Jules Husbands, MD;  Location: [facility] ORS;  Service: General;;    Family History  Problem Relation Age of Onset   Cirrhosis Mother    Other Father        Lung Fibrosis   Prostate cancer Neg Hx    Kidney cancer Neg Hx    Bladder Cancer Neg Hx     No Known Allergies     Latest Ref Rng & Units 10/12/2021    4:28 AM 10/11/2021    5:21 AM 09/23/2021  3:02 PM  CBC  WBC 4.0 - 10.5 K/uL 4.5  4.9  7.5   Hemoglobin 13.0 - 17.0 g/dL 9.2  10.3  10.4   Hematocrit 39.0 - 52.0 % 28.5  31.7  32.7   Platelets 150 - 400 K/uL 175  216  180       CMP     Component Value Date/Time   NA 140 10/12/2021 0428   NA 139 02/04/2014 1449   K 3.6 10/12/2021 0428   K 4.6 02/04/2014 1449   CL 105 10/12/2021 0428   CL 105 02/04/2014 1449   CO2 29 10/12/2021 0428   CO2 27 02/04/2014 1449   GLUCOSE 89 10/12/2021 0428   GLUCOSE 84 02/04/2014 1449   BUN 32 (H) 10/12/2021 0428   BUN 23 (H) 02/04/2014 1449   CREATININE 3.06 (H) 10/12/2021 0428   CREATININE 1.25 02/04/2014 1449   CALCIUM 8.8 (L) 10/12/2021 0428   CALCIUM 8.7 02/04/2014 1449   PROT 6.8 09/23/2021 1502   ALBUMIN 3.8 09/23/2021 1502   AST 13 (L) 09/23/2021 1502   ALT 7 09/23/2021 1502   ALKPHOS 63 09/23/2021 1502   BILITOT 0.6 09/23/2021 1502   GFRNONAA 20 (L) 10/12/2021 0428   GFRNONAA >60 02/04/2014 1449   GFRAA 22 (L) 09/15/2019 0856   GFRAA >60 02/04/2014 1449     No results found.     Assessment & Plan:   1. ESRD on dialysis [facility]) Currently the patient is maintained via PermCath while he is waiting for placement of his peritoneal dialysis catheter and healing following his intervention.  He is considering doing hemodialysis.  We also discussed that a fistula would be helpful in cases as these if he is  unable to use his peritoneal dialysis catheter for short time.  The patient would not require placement of a peritoneal dialysis catheter again.  The patient wishes to consider his options.  He will contact us if he elects to move forward with hemodialysis or if he wishes to have a backup dialysis access.  2. Benign hypertension with chronic kidney disease, stage III (HCC) Continue antihypertensive medications as already ordered, these medications have been reviewed and there are no changes at this time.   Current Outpatient Medications on File Prior to Visit  Medication Sig Dispense Refill   apixaban (ELIQUIS) 2.5 MG TABS tablet Take 2.5 mg by mouth 2 (two) times daily.     B Complex Vitamins (B-COMPLEX/B-12 PO) Take 1,000 mcg by mouth.     calcitRIOL (ROCALTROL) 0.25 MCG capsule Take 0.25 mcg by mouth daily.     carvedilol (COREG) 3.125 MG tablet Take 3.125 mg by mouth 2 (two) times daily with a meal.      cholecalciferol (VITAMIN D3) 25 MCG (1000 UNIT) tablet Take 1,000 Units by mouth daily.     Multiple Vitamin (MULTIVITAMIN WITH MINERALS) TABS tablet Take 1 tablet by mouth daily.     rosuvastatin (CRESTOR) 10 MG tablet Take 10 mg by mouth at bedtime.     senna-docusate (SENOKOT-S) 8.6-50 MG tablet Take 1 tablet by mouth daily.     tamsulosin (FLOMAX) 0.4 MG CAPS capsule Take 1 capsule (0.4 mg total) by mouth daily. 90 capsule 3   torsemide (DEMADEX) 20 MG tablet Take 2 tablets (40 mg total) by mouth daily. 60 tablet 0   traZODone (DESYREL) 50 MG tablet Take by mouth.     No current facility-administered medications on file prior to visit.    There are no Patient  Instructions on file for this visit. No follow-ups on file.   Kris Hartmann, NP

## 2021-11-13 DIAGNOSIS — N186 End stage renal disease: Secondary | ICD-10-CM | POA: Diagnosis not present

## 2021-11-13 DIAGNOSIS — Z992 Dependence on renal dialysis: Secondary | ICD-10-CM | POA: Diagnosis not present

## 2021-11-15 DIAGNOSIS — N186 End stage renal disease: Secondary | ICD-10-CM | POA: Diagnosis not present

## 2021-11-15 DIAGNOSIS — Z992 Dependence on renal dialysis: Secondary | ICD-10-CM | POA: Diagnosis not present

## 2021-11-18 DIAGNOSIS — N186 End stage renal disease: Secondary | ICD-10-CM | POA: Diagnosis not present

## 2021-11-18 DIAGNOSIS — Z992 Dependence on renal dialysis: Secondary | ICD-10-CM | POA: Diagnosis not present

## 2021-11-20 DIAGNOSIS — N186 End stage renal disease: Secondary | ICD-10-CM | POA: Diagnosis not present

## 2021-11-20 DIAGNOSIS — Z992 Dependence on renal dialysis: Secondary | ICD-10-CM | POA: Diagnosis not present

## 2021-11-22 DIAGNOSIS — N186 End stage renal disease: Secondary | ICD-10-CM | POA: Diagnosis not present

## 2021-11-22 DIAGNOSIS — Z7901 Long term (current) use of anticoagulants: Secondary | ICD-10-CM | POA: Diagnosis not present

## 2021-11-22 DIAGNOSIS — Z992 Dependence on renal dialysis: Secondary | ICD-10-CM | POA: Diagnosis not present

## 2021-11-22 DIAGNOSIS — I120 Hypertensive chronic kidney disease with stage 5 chronic kidney disease or end stage renal disease: Secondary | ICD-10-CM | POA: Diagnosis not present

## 2021-11-22 DIAGNOSIS — I509 Heart failure, unspecified: Secondary | ICD-10-CM | POA: Diagnosis not present

## 2021-11-25 DIAGNOSIS — N186 End stage renal disease: Secondary | ICD-10-CM | POA: Diagnosis not present

## 2021-11-25 DIAGNOSIS — Z992 Dependence on renal dialysis: Secondary | ICD-10-CM | POA: Diagnosis not present

## 2021-11-27 ENCOUNTER — Encounter: Payer: Medicare HMO | Admitting: Surgery

## 2021-11-27 DIAGNOSIS — Z992 Dependence on renal dialysis: Secondary | ICD-10-CM | POA: Diagnosis not present

## 2021-11-27 DIAGNOSIS — N186 End stage renal disease: Secondary | ICD-10-CM | POA: Diagnosis not present

## 2021-11-27 DIAGNOSIS — I4891 Unspecified atrial fibrillation: Secondary | ICD-10-CM | POA: Diagnosis not present

## 2021-11-27 DIAGNOSIS — Z9049 Acquired absence of other specified parts of digestive tract: Secondary | ICD-10-CM | POA: Diagnosis not present

## 2021-11-27 DIAGNOSIS — E7800 Pure hypercholesterolemia, unspecified: Secondary | ICD-10-CM | POA: Diagnosis not present

## 2021-11-27 DIAGNOSIS — I110 Hypertensive heart disease with heart failure: Secondary | ICD-10-CM | POA: Diagnosis not present

## 2021-11-27 DIAGNOSIS — I120 Hypertensive chronic kidney disease with stage 5 chronic kidney disease or end stage renal disease: Secondary | ICD-10-CM | POA: Diagnosis not present

## 2021-11-27 DIAGNOSIS — Z7901 Long term (current) use of anticoagulants: Secondary | ICD-10-CM | POA: Diagnosis not present

## 2021-11-27 DIAGNOSIS — I509 Heart failure, unspecified: Secondary | ICD-10-CM | POA: Diagnosis not present

## 2021-11-27 DIAGNOSIS — Z79899 Other long term (current) drug therapy: Secondary | ICD-10-CM | POA: Diagnosis not present

## 2021-11-28 DIAGNOSIS — Z992 Dependence on renal dialysis: Secondary | ICD-10-CM | POA: Diagnosis not present

## 2021-11-28 DIAGNOSIS — N186 End stage renal disease: Secondary | ICD-10-CM | POA: Diagnosis not present

## 2021-11-30 DIAGNOSIS — Z992 Dependence on renal dialysis: Secondary | ICD-10-CM | POA: Diagnosis not present

## 2021-11-30 DIAGNOSIS — N186 End stage renal disease: Secondary | ICD-10-CM | POA: Diagnosis not present

## 2021-12-02 DIAGNOSIS — N186 End stage renal disease: Secondary | ICD-10-CM | POA: Diagnosis not present

## 2021-12-02 DIAGNOSIS — Z992 Dependence on renal dialysis: Secondary | ICD-10-CM | POA: Diagnosis not present

## 2021-12-04 DIAGNOSIS — H2511 Age-related nuclear cataract, right eye: Secondary | ICD-10-CM | POA: Diagnosis not present

## 2021-12-04 DIAGNOSIS — H25011 Cortical age-related cataract, right eye: Secondary | ICD-10-CM | POA: Diagnosis not present

## 2021-12-05 DIAGNOSIS — N186 End stage renal disease: Secondary | ICD-10-CM | POA: Diagnosis not present

## 2021-12-05 DIAGNOSIS — Z992 Dependence on renal dialysis: Secondary | ICD-10-CM | POA: Diagnosis not present

## 2021-12-07 DIAGNOSIS — N186 End stage renal disease: Secondary | ICD-10-CM | POA: Diagnosis not present

## 2021-12-07 DIAGNOSIS — Z992 Dependence on renal dialysis: Secondary | ICD-10-CM | POA: Diagnosis not present

## 2021-12-08 ENCOUNTER — Encounter (INDEPENDENT_AMBULATORY_CARE_PROVIDER_SITE_OTHER): Payer: Self-pay | Admitting: Vascular Surgery

## 2021-12-08 ENCOUNTER — Ambulatory Visit (INDEPENDENT_AMBULATORY_CARE_PROVIDER_SITE_OTHER): Payer: Medicare HMO | Admitting: Vascular Surgery

## 2021-12-08 VITALS — BP 133/74 | HR 80 | Resp 16 | Wt 177.0 lb

## 2021-12-08 DIAGNOSIS — E785 Hyperlipidemia, unspecified: Secondary | ICD-10-CM | POA: Diagnosis not present

## 2021-12-08 DIAGNOSIS — I482 Chronic atrial fibrillation, unspecified: Secondary | ICD-10-CM | POA: Diagnosis not present

## 2021-12-08 DIAGNOSIS — N186 End stage renal disease: Secondary | ICD-10-CM | POA: Diagnosis not present

## 2021-12-08 NOTE — H&P (View-Only) (Signed)
MRN : [id number]  Lawrence Perkins. is a 81 y.o. (Oct 17, 1940) male who presents with chief complaint of  Chief Complaint  Patient presents with   Follow-up    Discuss surgery  .  History of Present Illness: Patient returns today in follow up today to discuss his dialysis access options.  He had his peritoneal dialysis catheter taken out in [place] last week.  It sounds like he was having significant fluid filling of the scrotal sac and this became intolerable.  He is currently using his PermCath which she wants to continue using.  He has previously had vein mapping showing adequate cephalic vein in either upper extremity for fistula creation.  He is right-hand dominant.  He does not like the idea of being stuck for dialysis but we discussed that long-term catheter-based dialysis cuts life expectancy significantly, and some studies up to 50%.  Current Outpatient Medications  Medication Sig Dispense Refill   apixaban (ELIQUIS) 2.5 MG TABS tablet Take 2.5 mg by mouth 2 (two) times daily.     B Complex Vitamins (B-COMPLEX/B-12 PO) Take 1,000 mcg by mouth.     calcitRIOL (ROCALTROL) 0.25 MCG capsule Take 0.25 mcg by mouth daily.     carvedilol (COREG) 3.125 MG tablet Take 3.125 mg by mouth 2 (two) times daily with a meal.      cholecalciferol (VITAMIN D3) 25 MCG (1000 UNIT) tablet Take 1,000 Units by mouth daily.     Multiple Vitamin (MULTIVITAMIN WITH MINERALS) TABS tablet Take 1 tablet by mouth daily.     rosuvastatin (CRESTOR) 10 MG tablet Take 10 mg by mouth at bedtime.     senna-docusate (SENOKOT-S) 8.6-50 MG tablet Take 1 tablet by mouth daily.     tamsulosin (FLOMAX) 0.4 MG CAPS capsule Take 1 capsule (0.4 mg total) by mouth daily. 90 capsule 3   torsemide (DEMADEX) 20 MG tablet Take 2 tablets (40 mg total) by mouth daily. 60 tablet 0   traZODone (DESYREL) 50 MG tablet Take by mouth.     No current facility-administered medications for this visit.    Past Medical History:   Diagnosis Date   Anemia    Aortic atherosclerosis (HCC)    Arthritis    Atrial fibrillation and flutter ([place])    a.) CHA2DS2-VASc = 5 (age x 2, CHF, HTN, aortic plaque). b.) rate/rhythm maintain on oral carvedilol; chronically anticoagulated with dose reduced apixaban   Bilateral inguinal hernia    BPH (benign prostatic hyperplasia)    Cardiac syncope    Cardiomyopathy ([place])    a.) TTE 04/23/2016: EF 40%. b.) TTE 09/03/2016: EF 50%. c.) TTE 05/04/2019: EF >55%.   CHF (congestive heart failure) ([place])    a.) TTE 04/23/2016: EF 40%; BAE; mild AR/TR, mod MR; G1DD. b.) TTE 09/03/2016: EF 50%; mild LVH; BAE; triv AR/PR, mod MR/TR. c.) TTE 05/04/2019: EF >55%; mod LVH, mild LA enlargement; mild AR/MR/TR   CKD (chronic kidney disease), stage V (HCC)    Complication of anesthesia    a.) h/o intra/postoperative A.fib   Coronary artery disease    Diverticulosis    Gout    History of 2019 novel coronavirus disease (COVID-19) 11/14/2020   History of colon polyps    Hypercholesteremia    Hypertension    Long term current use of anticoagulant    a.) dose reduced apixaban; dose reduction d/t to age and CKD Dx.   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[facility])    a.) s/p resection   MGUS (monoclonal  gammopathy of unknown significance)    Pleural effusion    Rectal bleeding    Secondary hyperparathyroidism of renal origin [facility])     Past Surgical History:  Procedure Laterality Date   ANKLE SURGERY Right 1985   following volleyball injury   APPENDECTOMY     at the of 9 yrs   CAPD INSERTION N/A 08/10/2021   Procedure: LAPAROSCOPIC INSERTION CONTINUOUS AMBULATORY PERITONEAL DIALYSIS  (CAPD) CATHETER;  Surgeon: Jules Husbands, MD;  Location: [facility] ORS;  Service: General;  Laterality: N/A;   CAPD REVISION N/A 09/21/2021   Procedure: LAPAROSCOPIC REVISION CONTINUOUS AMBULATORY PERITONEAL DIALYSIS  (CAPD) CATHETER;  Surgeon: Jules Husbands, MD;  Location: [facility] ORS;  Service: General;  Laterality: N/A;   COLONOSCOPY      COLONOSCOPY WITH PROPOFOL N/A 01/02/2017   Procedure: COLONOSCOPY WITH PROPOFOL;  Surgeon: Manya Silvas, MD;  Location: [facility] ENDOSCOPY;  Service: Endoscopy;  Laterality: N/A;   DIALYSIS/PERMA CATHETER INSERTION N/A 03/27/2021   Procedure: DIALYSIS/PERMA CATHETER INSERTION;  Surgeon: Algernon Huxley, MD;  Location: [place] CV LAB;  Service: Cardiovascular;  Laterality: N/A;   DIALYSIS/PERMA CATHETER INSERTION N/A 10/10/2021   Procedure: DIALYSIS/PERMA CATHETER INSERTION;  Surgeon: Katha Cabal, MD;  Location: [place] CV LAB;  Service: Cardiovascular;  Laterality: N/A;   DIALYSIS/PERMA CATHETER REMOVAL N/A 05/08/2021   Procedure: DIALYSIS/PERMA CATHETER REMOVAL;  Surgeon: Algernon Huxley, MD;  Location: [place] CV LAB;  Service: Cardiovascular;  Laterality: N/A;   INSERTION OF MESH  08/10/2021   Procedure: INSERTION OF MESH;  Surgeon: Jules Husbands, MD;  Location: [facility] ORS;  Service: General;;   JOINT REPLACEMENT     RIGHT TOTAL HIP   KNEE ARTHROSCOPY WITH LATERAL RELEASE Right 04/20/2021   Procedure: Right knee medial retinaculum and lateral release with polyethylene exchange;  Surgeon: Hessie Knows, MD;  Location: [facility] ORS;  Service: Orthopedics;  Laterality: Right;   TONSILLECTOMY     as a child   TOTAL HIP ARTHROPLASTY  09/2009   Right femoral neck fracture   TOTAL HIP ARTHROPLASTY Left 03/06/2016   Procedure: TOTAL HIP ARTHROPLASTY ANTERIOR APPROACH;  Surgeon: Hessie Knows, MD;  Location: [facility] ORS;  Service: Orthopedics;  Laterality: Left;   TOTAL KNEE ARTHROPLASTY Right 03/21/2021   Procedure: TOTAL KNEE ARTHROPLASTY;  Surgeon: Hessie Knows, MD;  Location: [facility] ORS;  Service: Orthopedics;  Laterality: Right;   UMBILICAL HERNIA REPAIR  08/10/2021   Procedure: HERNIA REPAIR UMBILICAL ADULT;  Surgeon: Jules Husbands, MD;  Location: [facility] ORS;  Service: General;;     Social History   Tobacco Use   Smoking status: Former    Years: 15.00    Types: Cigarettes     Quit date: 01/01/1981    Years since quitting: 40.9   Smokeless tobacco: Never  Vaping Use   Vaping Use: Never used  Substance Use Topics   Alcohol use: Not Currently   Drug use: No       Family History  Problem Relation Age of Onset   Cirrhosis Mother    Other Father        Lung Fibrosis   Prostate cancer Neg Hx    Kidney cancer Neg Hx    Bladder Cancer Neg Hx      No Known Allergies   REVIEW OF SYSTEMS (Negative unless checked)  Constitutional: '[]'$ Weight loss  '[]'$ Fever  '[]'$ Chills Cardiac: '[]'$ Chest pain   '[]'$ Chest pressure   '[x]'$ Palpitations   '[]'$ Shortness of breath when laying flat   '[]'$ Shortness  of breath at rest   '[x]'$ Shortness of breath with exertion. Vascular:  '[]'$ Pain in legs with walking   '[]'$ Pain in legs at rest   '[]'$ Pain in legs when laying flat   '[]'$ Claudication   '[]'$ Pain in feet when walking  '[]'$ Pain in feet at rest  '[]'$ Pain in feet when laying flat   '[]'$ History of DVT   '[]'$ Phlebitis   '[]'$ Swelling in legs   '[]'$ Varicose veins   '[]'$ Non-healing ulcers Pulmonary:   '[]'$ Uses home oxygen   '[]'$ Productive cough   '[]'$ Hemoptysis   '[]'$ Wheeze  '[]'$ COPD   '[]'$ Asthma Neurologic:  '[]'$ Dizziness  '[]'$ Blackouts   '[]'$ Seizures   '[]'$ History of stroke   '[]'$ History of TIA  '[]'$ Aphasia   '[]'$ Temporary blindness   '[]'$ Dysphagia   '[]'$ Weakness or numbness in arms   '[]'$ Weakness or numbness in legs Musculoskeletal:  '[x]'$ Arthritis   '[]'$ Joint swelling   '[]'$ Joint pain   '[]'$ Low back pain Hematologic:  '[]'$ Easy bruising  '[]'$ Easy bleeding   '[]'$ Hypercoagulable state   '[x]'$ Anemic   Gastrointestinal:  '[]'$ Blood in stool   '[]'$ Vomiting blood  '[]'$ Gastroesophageal reflux/heartburn   '[]'$ Abdominal pain Genitourinary:  '[x]'$ Chronic kidney disease   '[]'$ Difficult urination  '[]'$ Frequent urination  '[]'$ Burning with urination   '[]'$ Hematuria Skin:  '[]'$ Rashes   '[]'$ Ulcers   '[]'$ Wounds Psychological:  '[]'$ History of anxiety   '[]'$  History of major depression.  Physical Examination  BP 133/74 (BP Location: Left Arm)   Pulse 80   Resp 16   Wt 177 lb (80.3 kg)   BMI 25.40 kg/m  Gen:   WD/WN, NAD.  Appears younger than stated age Head: [place]/AT, No temporalis wasting. Ear/Nose/Throat: Hearing grossly intact, nares w/o erythema or drainage Eyes: Conjunctiva clear. Sclera non-icteric Neck: Supple.  Trachea midline Pulmonary:  Good air movement, no use of accessory muscles.  Cardiac: RRR, no JVD Vascular: PermCath present in the right subclavicular space without erythema or drainage Vessel Right Left  Radial Palpable Palpable                   Musculoskeletal: M/S 5/5 throughout.  No deformity or atrophy.  Trace lower extremity edema. Neurologic: Sensation grossly intact in extremities.  Symmetrical.  Speech is fluent.  Psychiatric: Judgment intact, Mood & affect appropriate for pt's clinical situation. Dermatologic: No rashes or ulcers noted.  No cellulitis or open wounds.      Labs Recent Results (from the past 2160 hour(s))  I-STAT, chem 8     Status: Abnormal   Collection Time: 09/21/21  8:14 AM  Result Value Ref Range   Sodium 139 135 - 145 mmol/L   Potassium 4.2 3.5 - 5.1 mmol/L   Chloride 101 98 - 111 mmol/L   BUN 50 (H) 8 - 23 mg/dL   Creatinine, Ser 4.70 (H) 0.61 - 1.24 mg/dL   Glucose, Bld 95 70 - 99 mg/dL    Comment: Glucose reference range applies only to samples taken after fasting for at least 8 hours.   Calcium, Ion 1.21 1.15 - 1.40 mmol/L   TCO2 27 22 - 32 mmol/L   Hemoglobin 10.9 (L) 13.0 - 17.0 g/dL   HCT 32.0 (L) 39.0 - 52.0 %  Lipase, blood     Status: None   Collection Time: 09/23/21  3:02 PM  Result Value Ref Range   Lipase 43 11 - 51 U/L    Comment: Performed at [facility] [address], [place], [place] [address]  Comprehensive metabolic panel     Status: Abnormal   Collection Time: 09/23/21  3:02  PM  Result Value Ref Range   Sodium 140 135 - 145 mmol/L   Potassium 4.4 3.5 - 5.1 mmol/L   Chloride 104 98 - 111 mmol/L   CO2 26 22 - 32 mmol/L   Glucose, Bld 91 70 - 99 mg/dL    Comment: Glucose reference range applies  only to samples taken after fasting for at least 8 hours.   BUN 78 (H) 8 - 23 mg/dL   Creatinine, Ser 4.73 (H) 0.61 - 1.24 mg/dL   Calcium 9.2 8.9 - 10.3 mg/dL   Total Protein 6.8 6.5 - 8.1 g/dL   Albumin 3.8 3.5 - 5.0 g/dL   AST 13 (L) 15 - 41 U/L   ALT 7 0 - 44 U/L   Alkaline Phosphatase 63 38 - 126 U/L   Total Bilirubin 0.6 0.3 - 1.2 mg/dL   GFR, Estimated 12 (L) >60 mL/min    Comment: (NOTE) Calculated using the CKD-EPI Creatinine Equation (2021)    Anion gap 10 5 - 15    Comment: Performed at [facility], [address] [place], [place] [address]  CBC     Status: Abnormal   Collection Time: 09/23/21  3:02 PM  Result Value Ref Range   WBC 7.5 4.0 - 10.5 K/uL   RBC 3.17 (L) 4.22 - 5.81 MIL/uL   Hemoglobin 10.4 (L) 13.0 - 17.0 g/dL   HCT 32.7 (L) 39.0 - 52.0 %   MCV 103.2 (H) 80.0 - 100.0 fL   MCH 32.8 26.0 - 34.0 pg   MCHC 31.8 30.0 - 36.0 g/dL   RDW 13.4 11.5 - 15.5 %   Platelets 180 150 - 400 K/uL   nRBC 0.0 0.0 - 0.2 %    Comment: Performed at [facility], [address] [place], [place] [address]  Urinalysis, Routine w reflex microscopic Urine, Clean Catch     Status: Abnormal   Collection Time: 09/23/21  3:02 PM  Result Value Ref Range   Color, Urine YELLOW YELLOW   APPearance CLEAR CLEAR   Specific Gravity, Urine 1.010 1.005 - 1.030   pH 5.5 5.0 - 8.0   Glucose, UA NEGATIVE NEGATIVE mg/dL   Hgb urine dipstick MODERATE (A) NEGATIVE   Bilirubin Urine NEGATIVE NEGATIVE   Ketones, ur NEGATIVE NEGATIVE mg/dL   Protein, ur >300 (A) NEGATIVE mg/dL   Nitrite NEGATIVE NEGATIVE   Leukocytes,Ua NEGATIVE NEGATIVE   RBC / HPF 6-10 0 - 5 RBC/hpf   WBC, UA 0-5 0 - 5 WBC/hpf   Bacteria, UA RARE (A) NONE SEEN   Squamous Epithelial / LPF NONE SEEN 0 - 5   Mucus PRESENT    Hyaline Casts, UA PRESENT     Comment: Performed at [facility], [address] [place], [place] [address]  Potassium [facility] vascular lab only)     Status: None    Collection Time: 10/10/21  4:01 PM  Result Value Ref Range   Potassium [facility] vascular lab) 4 3.5 - 5.1 mmol/L    Comment: Performed at [facility], [address], [place], [place] [address]  Basic metabolic panel     Status: Abnormal   Collection Time: 10/11/21  5:21 AM  Result Value Ref Range   Sodium 142 135 - 145 mmol/L   Potassium 3.9 3.5 - 5.1 mmol/L   Chloride 107 98 - 111 mmol/L   CO2 28 22 - 32 mmol/L   Glucose, Bld 93 70 - 99 mg/dL    Comment:  Glucose reference range applies only to samples taken after fasting for at least 8 hours.   BUN 47 (H) 8 - 23 mg/dL   Creatinine, Ser 3.95 (H) 0.61 - 1.24 mg/dL   Calcium 9.8 8.9 - 10.3 mg/dL   GFR, Estimated 15 (L) >60 mL/min    Comment: (NOTE) Calculated using the CKD-EPI Creatinine Equation (2021)    Anion gap 7 5 - 15    Comment: Performed at [facility], [address] [place], [place] [address]  CBC     Status: Abnormal   Collection Time: 10/11/21  5:21 AM  Result Value Ref Range   WBC 4.9 4.0 - 10.5 K/uL   RBC 3.08 (L) 4.22 - 5.81 MIL/uL   Hemoglobin 10.3 (L) 13.0 - 17.0 g/dL   HCT 31.7 (L) 39.0 - 52.0 %   MCV 102.9 (H) 80.0 - 100.0 fL   MCH 33.4 26.0 - 34.0 pg   MCHC 32.5 30.0 - 36.0 g/dL   RDW 13.3 11.5 - 15.5 %   Platelets 216 150 - 400 K/uL   nRBC 0.0 0.0 - 0.2 %    Comment: Performed at [facility], [place], [place] [address]  Troponin I (High Sensitivity)     Status: Abnormal   Collection Time: 10/11/21  5:21 AM  Result Value Ref Range   Troponin I (High Sensitivity) 52 (H) <18 ng/L    Comment: (NOTE) Elevated high sensitivity troponin I (hsTnI) values and significant  changes across serial measurements may suggest ACS but many other  chronic and acute conditions are known to elevate hsTnI results.  Refer to the "Links" section for chest pain algorithms and additional  guidance. Performed at [facility], [address] [place], [place] [address]    Brain natriuretic peptide     Status: Abnormal   Collection Time: 10/11/21  5:21 AM  Result Value Ref Range   B Natriuretic Peptide 573.6 (H) 0.0 - 100.0 pg/mL    Comment: Performed at [facility], [place], [place] [address]  Troponin I (High Sensitivity)     Status: Abnormal   Collection Time: 10/11/21  6:58 AM  Result Value Ref Range   Troponin I (High Sensitivity) 48 (H) <18 ng/L    Comment: (NOTE) Elevated high sensitivity troponin I (hsTnI) values and significant  changes across serial measurements may suggest ACS but many other  chronic and acute conditions are known to elevate hsTnI results.  Refer to the "Links" section for chest pain algorithms and additional  guidance. Performed at [facility], [address] [place], [place] [address]   CBG monitoring, ED     Status: None   Collection Time: 10/11/21  8:08 AM  Result Value Ref Range   Glucose-Capillary 84 70 - 99 mg/dL    Comment: Glucose reference range applies only to samples taken after fasting for at least 8 hours.  Hepatitis B surface antigen     Status: Abnormal   Collection Time: 10/11/21  9:50 AM  Result Value Ref Range   Hepatitis B Surface Ag See Scanned report in [place] (A) NON REACTIVE    Comment: Performed at [employer] Performed at [facility], [place] [address], [place], [place] [address]   Hepatitis B surface antibody     Status: Abnormal   Collection Time: 10/11/21  9:50 AM  Result Value Ref Range   Hep B S Ab See Scanned report in [facility] (A) NON REACTIVE    Comment: Performed at [employer] Performed at [facility], [place] [address] [place], [place] [address]   Hepatitis B surface antibody,quantitative     Status: None   Collection Time: 10/11/21  9:50 AM  Result Value Ref Range   Hep B S AB Quant (Post) >1,000.0 Immunity>9.9 mIU/mL    Comment: (NOTE)  Status of Immunity                     Anti-HBs Level   ------------------                     -------------- Inconsistent with Immunity                   0.0 - 9.9 Consistent with Immunity                          >9.9 Performed At: [facility] [address] [place], [place] [id number] Rush Farmer MD [id number]   Hepatitis B core antibody, total     Status: Abnormal   Collection Time: 10/11/21  9:50 AM  Result Value Ref Range   Hep B Core Total Ab See Scanned report in [place] (A) NON REACTIVE    Comment: Performed at [employer] Performed at [facility], [place] [address], [place], [place] [address]   Hepatitis C antibody     Status: Abnormal   Collection Time: 10/11/21  9:50 AM  Result Value Ref Range   HCV Ab Non Reactive (A) Non Reactive    Comment: (NOTE) HCV antibody alone does not differentiate between previously resolved infection and active infection. Equivocal and Reactive HCV antibody results should be followed up with an HCV RNA test to support the diagnosis of active HCV infection. Performed At: [facility] [place], [place] [id number] Rush Farmer MD [id number]   ECHOCARDIOGRAM COMPLETE     Status: None   Collection Time: 10/11/21  7:04 PM  Result Value Ref Range   Weight 2,878.4 oz   Height 70 in   BP 161/99 mmHg   Ao pk vel 1.82 m/s   AV Area VTI 1.97 cm2   AR max vel 1.76 cm2   AV Mean grad 7.3 mmHg   AV Peak grad 13.2 mmHg   S' Lateral 3.10 cm   AV Area mean vel 1.78 cm2  Basic metabolic panel     Status: Abnormal   Collection Time: 10/12/21  4:28 AM  Result Value Ref Range   Sodium 140 135 - 145 mmol/L   Potassium 3.6 3.5 - 5.1 mmol/L   Chloride 105 98 - 111 mmol/L   CO2 29 22 - 32 mmol/L   Glucose, Bld 89 70 - 99 mg/dL    Comment: Glucose reference range applies only to samples taken after fasting for at least 8 hours.   BUN 32 (H) 8 - 23 mg/dL   Creatinine, Ser 3.06 (H) 0.61 - 1.24 mg/dL   Calcium 8.8 (L) 8.9 - 10.3 mg/dL   GFR,  Estimated 20 (L) >60 mL/min    Comment: (NOTE) Calculated using the CKD-EPI Creatinine Equation (2021)    Anion gap 6 5 - 15    Comment: Performed at [facility], [address], [place], [place] [address]  CBC     Status: Abnormal   Collection Time: 10/12/21  4:28 AM  Result  Value Ref Range   WBC 4.5 4.0 - 10.5 K/uL   RBC 2.79 (L) 4.22 - 5.81 MIL/uL   Hemoglobin 9.2 (L) 13.0 - 17.0 g/dL   HCT 28.5 (L) 39.0 - 52.0 %   MCV 102.2 (H) 80.0 - 100.0 fL   MCH 33.0 26.0 - 34.0 pg   MCHC 32.3 30.0 - 36.0 g/dL   RDW 13.1 11.5 - 15.5 %   Platelets 175 150 - 400 K/uL   nRBC 0.0 0.0 - 0.2 %    Comment: Performed at [facility], [address], [place], [place] [address]    Radiology No results found.  Assessment/Plan  ESRD (end stage renal disease) ([place]) I had a long discussion today with patient regarding his access options.  I would not recommend long-term catheter-based dialysis in a patient who has good anatomy for fistula creation.  I would recommend a left arm AV fistula either at the radiocephalic or the brachiocephalic location.  I discussed this would generally increase his lifespan significantly.  I discussed the long-term catheter-based dialysis is suboptimal.  Peritoneal dialysis did not work for the patient.  Patient wants to go home and think about his options and said he will call us if he decides to have a fistula created which I have strongly recommended  Hyperlipidemia lipid control important in reducing the progression of atherosclerotic disease. Continue statin therapy   ATRIAL FIBRILLATION On anticoagulation    Leotis Pain, MD  12/08/2021 10:08 AM    This note was created with Dragon medical transcription system.  Any errors from dictation are purely unintentional

## 2021-12-08 NOTE — Assessment & Plan Note (Signed)
I had a long discussion today with patient regarding his access options.  I would not recommend long-term catheter-based dialysis in a patient who has good anatomy for fistula creation.  I would recommend a left arm AV fistula either at the radiocephalic or the brachiocephalic location.  I discussed this would generally increase his lifespan significantly.  I discussed the long-term catheter-based dialysis is suboptimal.  Peritoneal dialysis did not work for the patient.  Patient wants to go home and think about his options and said he will call us if he decides to have a fistula created which I have strongly recommended

## 2021-12-08 NOTE — Assessment & Plan Note (Signed)
On anticoagulation 

## 2021-12-08 NOTE — Progress Notes (Signed)
MRN : [id number]  Lawrence Perkins. is a 81 y.o. ([birth year]) male who presents with chief complaint of  Chief Complaint  Patient presents with   Follow-up    Discuss surgery  .  History of Present Illness: Patient returns today in follow up today to discuss his dialysis access options.  He had his peritoneal dialysis catheter taken out in [place] last week.  It sounds like he was having significant fluid filling of the scrotal sac and this became intolerable.  He is currently using his PermCath which she wants to continue using.  He has previously had vein mapping showing adequate cephalic vein in either upper extremity for fistula creation.  He is right-hand dominant.  He does not like the idea of being stuck for dialysis but we discussed that long-term catheter-based dialysis cuts life expectancy significantly, and some studies up to 50%.  Current Outpatient Medications  Medication Sig Dispense Refill   apixaban (ELIQUIS) 2.5 MG TABS tablet Take 2.5 mg by mouth 2 (two) times daily.     B Complex Vitamins (B-COMPLEX/B-12 PO) Take 1,000 mcg by mouth.     calcitRIOL (ROCALTROL) 0.25 MCG capsule Take 0.25 mcg by mouth daily.     carvedilol (COREG) 3.125 MG tablet Take 3.125 mg by mouth 2 (two) times daily with a meal.      cholecalciferol (VITAMIN D3) 25 MCG (1000 UNIT) tablet Take 1,000 Units by mouth daily.     Multiple Vitamin (MULTIVITAMIN WITH MINERALS) TABS tablet Take 1 tablet by mouth daily.     rosuvastatin (CRESTOR) 10 MG tablet Take 10 mg by mouth at bedtime.     senna-docusate (SENOKOT-S) 8.6-50 MG tablet Take 1 tablet by mouth daily.     tamsulosin (FLOMAX) 0.4 MG CAPS capsule Take 1 capsule (0.4 mg total) by mouth daily. 90 capsule 3   torsemide (DEMADEX) 20 MG tablet Take 2 tablets (40 mg total) by mouth daily. 60 tablet 0   traZODone (DESYREL) 50 MG tablet Take by mouth.     No current facility-administered medications for this visit.    Past Medical History:   Diagnosis Date   Anemia    Aortic atherosclerosis (HCC)    Arthritis    Atrial fibrillation and flutter ([place])    a.) CHA2DS2-VASc = 5 (age x 2, CHF, HTN, aortic plaque). b.) rate/rhythm maintain on oral carvedilol; chronically anticoagulated with dose reduced apixaban   Bilateral inguinal hernia    BPH (benign prostatic hyperplasia)    Cardiac syncope    Cardiomyopathy ([place])    a.) TTE 04/23/2016: EF 40%. b.) TTE 09/03/2016: EF 50%. c.) TTE 05/04/2019: EF >55%.   CHF (congestive heart failure) ([place])    a.) TTE 04/23/2016: EF 40%; BAE; mild AR/TR, mod MR; G1DD. b.) TTE 09/03/2016: EF 50%; mild LVH; BAE; triv AR/PR, mod MR/TR. c.) TTE 05/04/2019: EF >55%; mod LVH, mild LA enlargement; mild AR/MR/TR   CKD (chronic kidney disease), stage V (HCC)    Complication of anesthesia    a.) h/o intra/postoperative A.fib   Coronary artery disease    Diverticulosis    Gout    History of 2019 novel coronavirus disease (COVID-19) 11/14/2020   History of colon polyps    Hypercholesteremia    Hypertension    Long term current use of anticoagulant    a.) dose reduced apixaban; dose reduction d/t to age and CKD Dx.   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[facility])    a.) s/p resection   MGUS (monoclonal  gammopathy of unknown significance)    Pleural effusion    Rectal bleeding    Secondary hyperparathyroidism of renal origin [facility])     Past Surgical History:  Procedure Laterality Date   ANKLE SURGERY Right 1985   following volleyball injury   APPENDECTOMY     at the of 9 yrs   CAPD INSERTION N/A 08/10/2021   Procedure: LAPAROSCOPIC INSERTION CONTINUOUS AMBULATORY PERITONEAL DIALYSIS  (CAPD) CATHETER;  Surgeon: Jules Husbands, MD;  Location: [facility] ORS;  Service: General;  Laterality: N/A;   CAPD REVISION N/A 09/21/2021   Procedure: LAPAROSCOPIC REVISION CONTINUOUS AMBULATORY PERITONEAL DIALYSIS  (CAPD) CATHETER;  Surgeon: Jules Husbands, MD;  Location: [facility] ORS;  Service: General;  Laterality: N/A;   COLONOSCOPY      COLONOSCOPY WITH PROPOFOL N/A 01/02/2017   Procedure: COLONOSCOPY WITH PROPOFOL;  Surgeon: Manya Silvas, MD;  Location: [facility] ENDOSCOPY;  Service: Endoscopy;  Laterality: N/A;   DIALYSIS/PERMA CATHETER INSERTION N/A 03/27/2021   Procedure: DIALYSIS/PERMA CATHETER INSERTION;  Surgeon: Algernon Huxley, MD;  Location: [place] CV LAB;  Service: Cardiovascular;  Laterality: N/A;   DIALYSIS/PERMA CATHETER INSERTION N/A 10/10/2021   Procedure: DIALYSIS/PERMA CATHETER INSERTION;  Surgeon: Katha Cabal, MD;  Location: [place] CV LAB;  Service: Cardiovascular;  Laterality: N/A;   DIALYSIS/PERMA CATHETER REMOVAL N/A 05/08/2021   Procedure: DIALYSIS/PERMA CATHETER REMOVAL;  Surgeon: Algernon Huxley, MD;  Location: [place] CV LAB;  Service: Cardiovascular;  Laterality: N/A;   INSERTION OF MESH  08/10/2021   Procedure: INSERTION OF MESH;  Surgeon: Jules Husbands, MD;  Location: [facility] ORS;  Service: General;;   JOINT REPLACEMENT     RIGHT TOTAL HIP   KNEE ARTHROSCOPY WITH LATERAL RELEASE Right 04/20/2021   Procedure: Right knee medial retinaculum and lateral release with polyethylene exchange;  Surgeon: Hessie Knows, MD;  Location: [facility] ORS;  Service: Orthopedics;  Laterality: Right;   TONSILLECTOMY     as a child   TOTAL HIP ARTHROPLASTY  09/2009   Right femoral neck fracture   TOTAL HIP ARTHROPLASTY Left 03/06/2016   Procedure: TOTAL HIP ARTHROPLASTY ANTERIOR APPROACH;  Surgeon: Hessie Knows, MD;  Location: [facility] ORS;  Service: Orthopedics;  Laterality: Left;   TOTAL KNEE ARTHROPLASTY Right 03/21/2021   Procedure: TOTAL KNEE ARTHROPLASTY;  Surgeon: Hessie Knows, MD;  Location: [facility] ORS;  Service: Orthopedics;  Laterality: Right;   UMBILICAL HERNIA REPAIR  08/10/2021   Procedure: HERNIA REPAIR UMBILICAL ADULT;  Surgeon: Jules Husbands, MD;  Location: [facility] ORS;  Service: General;;     Social History   Tobacco Use   Smoking status: Former    Years: 15.00    Types: Cigarettes     Quit date: 01/01/1981    Years since quitting: 40.9   Smokeless tobacco: Never  Vaping Use   Vaping Use: Never used  Substance Use Topics   Alcohol use: Not Currently   Drug use: No       Family History  Problem Relation Age of Onset   Cirrhosis Mother    Other Father        Lung Fibrosis   Prostate cancer Neg Hx    Kidney cancer Neg Hx    Bladder Cancer Neg Hx      No Known Allergies   REVIEW OF SYSTEMS (Negative unless checked)  Constitutional: '[]'$ Weight loss  '[]'$ Fever  '[]'$ Chills Cardiac: '[]'$ Chest pain   '[]'$ Chest pressure   '[x]'$ Palpitations   '[]'$ Shortness of breath when laying flat   '[]'$ Shortness  of breath at rest   '[x]'$ Shortness of breath with exertion. Vascular:  '[]'$ Pain in legs with walking   '[]'$ Pain in legs at rest   '[]'$ Pain in legs when laying flat   '[]'$ Claudication   '[]'$ Pain in feet when walking  '[]'$ Pain in feet at rest  '[]'$ Pain in feet when laying flat   '[]'$ History of DVT   '[]'$ Phlebitis   '[]'$ Swelling in legs   '[]'$ Varicose veins   '[]'$ Non-healing ulcers Pulmonary:   '[]'$ Uses home oxygen   '[]'$ Productive cough   '[]'$ Hemoptysis   '[]'$ Wheeze  '[]'$ COPD   '[]'$ Asthma Neurologic:  '[]'$ Dizziness  '[]'$ Blackouts   '[]'$ Seizures   '[]'$ History of stroke   '[]'$ History of TIA  '[]'$ Aphasia   '[]'$ Temporary blindness   '[]'$ Dysphagia   '[]'$ Weakness or numbness in arms   '[]'$ Weakness or numbness in legs Musculoskeletal:  '[x]'$ Arthritis   '[]'$ Joint swelling   '[]'$ Joint pain   '[]'$ Low back pain Hematologic:  '[]'$ Easy bruising  '[]'$ Easy bleeding   '[]'$ Hypercoagulable state   '[x]'$ Anemic   Gastrointestinal:  '[]'$ Blood in stool   '[]'$ Vomiting blood  '[]'$ Gastroesophageal reflux/heartburn   '[]'$ Abdominal pain Genitourinary:  '[x]'$ Chronic kidney disease   '[]'$ Difficult urination  '[]'$ Frequent urination  '[]'$ Burning with urination   '[]'$ Hematuria Skin:  '[]'$ Rashes   '[]'$ Ulcers   '[]'$ Wounds Psychological:  '[]'$ History of anxiety   '[]'$  History of major depression.  Physical Examination  BP 133/74 (BP Location: Left Arm)   Pulse 80   Resp 16   Wt 177 lb (80.3 kg)   BMI 25.40 kg/m  Gen:   WD/WN, NAD.  Appears younger than stated age Head: [place]/AT, No temporalis wasting. Ear/Nose/Throat: Hearing grossly intact, nares w/o erythema or drainage Eyes: Conjunctiva clear. Sclera non-icteric Neck: Supple.  Trachea midline Pulmonary:  Good air movement, no use of accessory muscles.  Cardiac: RRR, no JVD Vascular: PermCath present in the right subclavicular space without erythema or drainage Vessel Right Left  Radial Palpable Palpable                   Musculoskeletal: M/S 5/5 throughout.  No deformity or atrophy.  Trace lower extremity edema. Neurologic: Sensation grossly intact in extremities.  Symmetrical.  Speech is fluent.  Psychiatric: Judgment intact, Mood & affect appropriate for pt's clinical situation. Dermatologic: No rashes or ulcers noted.  No cellulitis or open wounds.      Labs Recent Results (from the past 2160 hour(s))  I-STAT, chem 8     Status: Abnormal   Collection Time: 09/21/21  8:14 AM  Result Value Ref Range   Sodium 139 135 - 145 mmol/L   Potassium 4.2 3.5 - 5.1 mmol/L   Chloride 101 98 - 111 mmol/L   BUN 50 (H) 8 - 23 mg/dL   Creatinine, Ser 4.70 (H) 0.61 - 1.24 mg/dL   Glucose, Bld 95 70 - 99 mg/dL    Comment: Glucose reference range applies only to samples taken after fasting for at least 8 hours.   Calcium, Ion 1.21 1.15 - 1.40 mmol/L   TCO2 27 22 - 32 mmol/L   Hemoglobin 10.9 (L) 13.0 - 17.0 g/dL   HCT 32.0 (L) 39.0 - 52.0 %  Lipase, blood     Status: None   Collection Time: 09/23/21  3:02 PM  Result Value Ref Range   Lipase 43 11 - 51 U/L    Comment: Performed at [facility], [address] [place], [place] [address]  Comprehensive metabolic panel     Status: Abnormal   Collection Time: 09/23/21  3:02  PM  Result Value Ref Range   Sodium 140 135 - 145 mmol/L   Potassium 4.4 3.5 - 5.1 mmol/L   Chloride 104 98 - 111 mmol/L   CO2 26 22 - 32 mmol/L   Glucose, Bld 91 70 - 99 mg/dL    Comment: Glucose reference range applies  only to samples taken after fasting for at least 8 hours.   BUN 78 (H) 8 - 23 mg/dL   Creatinine, Ser 4.73 (H) 0.61 - 1.24 mg/dL   Calcium 9.2 8.9 - 10.3 mg/dL   Total Protein 6.8 6.5 - 8.1 g/dL   Albumin 3.8 3.5 - 5.0 g/dL   AST 13 (L) 15 - 41 U/L   ALT 7 0 - 44 U/L   Alkaline Phosphatase 63 38 - 126 U/L   Total Bilirubin 0.6 0.3 - 1.2 mg/dL   GFR, Estimated 12 (L) >60 mL/min    Comment: (NOTE) Calculated using the CKD-EPI Creatinine Equation (2021)    Anion gap 10 5 - 15    Comment: Performed at [facility], [address] [place], [place] [address]  CBC     Status: Abnormal   Collection Time: 09/23/21  3:02 PM  Result Value Ref Range   WBC 7.5 4.0 - 10.5 K/uL   RBC 3.17 (L) 4.22 - 5.81 MIL/uL   Hemoglobin 10.4 (L) 13.0 - 17.0 g/dL   HCT 32.7 (L) 39.0 - 52.0 %   MCV 103.2 (H) 80.0 - 100.0 fL   MCH 32.8 26.0 - 34.0 pg   MCHC 31.8 30.0 - 36.0 g/dL   RDW 13.4 11.5 - 15.5 %   Platelets 180 150 - 400 K/uL   nRBC 0.0 0.0 - 0.2 %    Comment: Performed at [facility], [address] [place], [place] [address]  Urinalysis, Routine w reflex microscopic Urine, Clean Catch     Status: Abnormal   Collection Time: 09/23/21  3:02 PM  Result Value Ref Range   Color, Urine YELLOW YELLOW   APPearance CLEAR CLEAR   Specific Gravity, Urine 1.010 1.005 - 1.030   pH 5.5 5.0 - 8.0   Glucose, UA NEGATIVE NEGATIVE mg/dL   Hgb urine dipstick MODERATE (A) NEGATIVE   Bilirubin Urine NEGATIVE NEGATIVE   Ketones, ur NEGATIVE NEGATIVE mg/dL   Protein, ur >300 (A) NEGATIVE mg/dL   Nitrite NEGATIVE NEGATIVE   Leukocytes,Ua NEGATIVE NEGATIVE   RBC / HPF 6-10 0 - 5 RBC/hpf   WBC, UA 0-5 0 - 5 WBC/hpf   Bacteria, UA RARE (A) NONE SEEN   Squamous Epithelial / LPF NONE SEEN 0 - 5   Mucus PRESENT    Hyaline Casts, UA PRESENT     Comment: Performed at [facility], [address], [place], [place] [address]  Potassium [facility] vascular lab only)     Status: None    Collection Time: 10/10/21  4:01 PM  Result Value Ref Range   Potassium [facility] vascular lab) 4 3.5 - 5.1 mmol/L    Comment: Performed at [facility], [address], [place], [place] [address]  Basic metabolic panel     Status: Abnormal   Collection Time: 10/11/21  5:21 AM  Result Value Ref Range   Sodium 142 135 - 145 mmol/L   Potassium 3.9 3.5 - 5.1 mmol/L   Chloride 107 98 - 111 mmol/L   CO2 28 22 - 32 mmol/L   Glucose, Bld 93 70 - 99 mg/dL    Comment:  Glucose reference range applies only to samples taken after fasting for at least 8 hours.   BUN 47 (H) 8 - 23 mg/dL   Creatinine, Ser 3.95 (H) 0.61 - 1.24 mg/dL   Calcium 9.8 8.9 - 10.3 mg/dL   GFR, Estimated 15 (L) >60 mL/min    Comment: (NOTE) Calculated using the CKD-EPI Creatinine Equation (2021)    Anion gap 7 5 - 15    Comment: Performed at [facility], [address] [place], [place] [address]  CBC     Status: Abnormal   Collection Time: 10/11/21  5:21 AM  Result Value Ref Range   WBC 4.9 4.0 - 10.5 K/uL   RBC 3.08 (L) 4.22 - 5.81 MIL/uL   Hemoglobin 10.3 (L) 13.0 - 17.0 g/dL   HCT 31.7 (L) 39.0 - 52.0 %   MCV 102.9 (H) 80.0 - 100.0 fL   MCH 33.4 26.0 - 34.0 pg   MCHC 32.5 30.0 - 36.0 g/dL   RDW 13.3 11.5 - 15.5 %   Platelets 216 150 - 400 K/uL   nRBC 0.0 0.0 - 0.2 %    Comment: Performed at [facility], [place], [place] [address]  Troponin I (High Sensitivity)     Status: Abnormal   Collection Time: 10/11/21  5:21 AM  Result Value Ref Range   Troponin I (High Sensitivity) 52 (H) <18 ng/L    Comment: (NOTE) Elevated high sensitivity troponin I (hsTnI) values and significant  changes across serial measurements may suggest ACS but many other  chronic and acute conditions are known to elevate hsTnI results.  Refer to the "Links" section for chest pain algorithms and additional  guidance. Performed at [facility], [address] [place], [place] [address]    Brain natriuretic peptide     Status: Abnormal   Collection Time: 10/11/21  5:21 AM  Result Value Ref Range   B Natriuretic Peptide 573.6 (H) 0.0 - 100.0 pg/mL    Comment: Performed at [facility], [place], [place] [address]  Troponin I (High Sensitivity)     Status: Abnormal   Collection Time: 10/11/21  6:58 AM  Result Value Ref Range   Troponin I (High Sensitivity) 48 (H) <18 ng/L    Comment: (NOTE) Elevated high sensitivity troponin I (hsTnI) values and significant  changes across serial measurements may suggest ACS but many other  chronic and acute conditions are known to elevate hsTnI results.  Refer to the "Links" section for chest pain algorithms and additional  guidance. Performed at [facility], [address] [place], [place] [address]   CBG monitoring, ED     Status: None   Collection Time: 10/11/21  8:08 AM  Result Value Ref Range   Glucose-Capillary 84 70 - 99 mg/dL    Comment: Glucose reference range applies only to samples taken after fasting for at least 8 hours.  Hepatitis B surface antigen     Status: Abnormal   Collection Time: 10/11/21  9:50 AM  Result Value Ref Range   Hepatitis B Surface Ag See Scanned report in Max (A) NON REACTIVE    Comment: Performed at [employer] Performed at [facility], [place] [address], [place],  [address]   Hepatitis B surface antibody     Status: Abnormal   Collection Time: 10/11/21  9:50 AM  Result Value Ref Range   Hep B S Ab See Scanned report in [facility] (A) NON REACTIVE    Comment: Performed at [employer] Performed at [facility], [place] [address], [place], [place] [address]   Hepatitis B surface antibody,quantitative     Status: None   Collection Time: 10/11/21  9:50 AM  Result Value Ref Range   Hep B S AB Quant (Post) >1,000.0 Immunity>9.9 mIU/mL    Comment: (NOTE)  Status of Immunity                     Anti-HBs Level   ------------------                     -------------- Inconsistent with Immunity                   0.0 - 9.9 Consistent with Immunity                          >9.9 Performed At: [facility] [address] [place], [place] [id number] Rush Farmer MD [id number]   Hepatitis B core antibody, total     Status: Abnormal   Collection Time: 10/11/21  9:50 AM  Result Value Ref Range   Hep B Core Total Ab See Scanned report in [place] (A) NON REACTIVE    Comment: Performed at [employer] Performed at [facility], [place] [address], [place], [place] [address]   Hepatitis C antibody     Status: Abnormal   Collection Time: 10/11/21  9:50 AM  Result Value Ref Range   HCV Ab Non Reactive (A) Non Reactive    Comment: (NOTE) HCV antibody alone does not differentiate between previously resolved infection and active infection. Equivocal and Reactive HCV antibody results should be followed up with an HCV RNA test to support the diagnosis of active HCV infection. Performed At: [facility] [place], [place] [id number] Rush Farmer MD [id number]   ECHOCARDIOGRAM COMPLETE     Status: None   Collection Time: 10/11/21  7:04 PM  Result Value Ref Range   Weight 2,878.4 oz   Height 70 in   BP 161/99 mmHg   Ao pk vel 1.82 m/s   AV Area VTI 1.97 cm2   AR max vel 1.76 cm2   AV Mean grad 7.3 mmHg   AV Peak grad 13.2 mmHg   S' Lateral 3.10 cm   AV Area mean vel 1.78 cm2  Basic metabolic panel     Status: Abnormal   Collection Time: 10/12/21  4:28 AM  Result Value Ref Range   Sodium 140 135 - 145 mmol/L   Potassium 3.6 3.5 - 5.1 mmol/L   Chloride 105 98 - 111 mmol/L   CO2 29 22 - 32 mmol/L   Glucose, Bld 89 70 - 99 mg/dL    Comment: Glucose reference range applies only to samples taken after fasting for at least 8 hours.   BUN 32 (H) 8 - 23 mg/dL   Creatinine, Ser 3.06 (H) 0.61 - 1.24 mg/dL   Calcium 8.8 (L) 8.9 - 10.3 mg/dL   GFR,  Estimated 20 (L) >60 mL/min    Comment: (NOTE) Calculated using the CKD-EPI Creatinine Equation (2021)    Anion gap 6 5 - 15    Comment: Performed at [facility], [address] [place], [place] [address]  CBC     Status: Abnormal   Collection Time: 10/12/21  4:28 AM  Result  Value Ref Range   WBC 4.5 4.0 - 10.5 K/uL   RBC 2.79 (L) 4.22 - 5.81 MIL/uL   Hemoglobin 9.2 (L) 13.0 - 17.0 g/dL   HCT 28.5 (L) 39.0 - 52.0 %   MCV 102.2 (H) 80.0 - 100.0 fL   MCH 33.0 26.0 - 34.0 pg   MCHC 32.3 30.0 - 36.0 g/dL   RDW 13.1 11.5 - 15.5 %   Platelets 175 150 - 400 K/uL   nRBC 0.0 0.0 - 0.2 %    Comment: Performed at [facility], [address], [place], [place] [address]    Radiology No results found.  Assessment/Plan  ESRD (end stage renal disease) ([place]) I had a long discussion today with patient regarding his access options.  I would not recommend long-term catheter-based dialysis in a patient who has good anatomy for fistula creation.  I would recommend a left arm AV fistula either at the radiocephalic or the brachiocephalic location.  I discussed this would generally increase his lifespan significantly.  I discussed the long-term catheter-based dialysis is suboptimal.  Peritoneal dialysis did not work for the patient.  Patient wants to go home and think about his options and said he will call us if he decides to have a fistula created which I have strongly recommended  Hyperlipidemia lipid control important in reducing the progression of atherosclerotic disease. Continue statin therapy   ATRIAL FIBRILLATION On anticoagulation    Leotis Pain, MD  12/08/2021 10:08 AM    This note was created with Dragon medical transcription system.  Any errors from dictation are purely unintentional

## 2021-12-08 NOTE — Patient Instructions (Signed)
AV Fistula Placement  Arteriovenous (AV) fistula placement is a surgical procedure to create a connection between a blood vessel that carries blood away from the heart (artery) and a blood vessel that returns blood to the heart (vein). This connection is called a fistula. It is often made in the forearm or upper arm. You may need this procedure if you are getting hemodialysis treatments for kidney disease. An AV fistula makes your vein larger and stronger over several months. This makes the vein a safe and easy spot to insert the needles that are used for hemodialysis. Tell a health care provider about: Any allergies you have. All medicines you are taking, including vitamins, herbs, eye drops, creams, and over-the-counter medicines. Any problems you or family members have had with anesthetic medicines. Any blood disorders you have. Any surgeries you have had. Any medical conditions you have or have had in the past. Whether you are pregnant or may be pregnant. What are the risks? Generally, this is a safe procedure. However, problems may occur, including: Infection. Blood clot. Reduced blood flow (stenosis). Weakening or ballooning out of the fistula (aneurysm). Bleeding. Allergic reactions to medicines. Nerve damage. Swelling near the fistula. Failure of the procedure. What happens before the procedure? Staying hydrated Follow instructions from your health care provider about hydration, which may include: Up to 2 hours before the procedure - you may continue to drink clear liquids, such as water, clear fruit juice, black coffee, and plain tea.  Eating and drinking restrictions Follow instructions from your health care provider about eating and drinking, which may include: 8 hours before the procedure - stop eating heavy meals or foods, such as meat, fried foods, or fatty foods. 6 hours before the procedure - stop eating light meals or foods, such as toast or cereal. 6 hours before the  procedure - stop drinking milk or drinks that contain milk. 2 hours before the procedure - stop drinking clear liquids. Medicines Ask your health care provider about: Changing or stopping your regular medicines. This is especially important if you are taking diabetes medicines or blood thinners. Taking medicines such as aspirin and ibuprofen. These medicines can thin your blood. Do not take these medicines unless your health care provider tells you to take them. Taking over-the-counter medicines, vitamins, herbs, and supplements. General instructions Do not use any products that contain nicotine or tobacco for at least 4 weeks before the procedure. These products include cigarettes, chewing tobacco, and vaping devices, such as e-cigarettes. If you need help quitting, ask your health care provider. Imaging tests of your arm may be done to find the best place for the fistula. Plan to have a responsible adult take you home from the hospital or clinic. Ask your health care provider: How your surgery site will be marked. What steps will be taken to help prevent infection. These steps may include: Removing hair at the surgery site. Washing skin with a germ-killing soap. Taking antibiotic medicine. What happens during the procedure? An IV will be inserted into one of your veins. You will be given one or more of the following: A medicine to help you relax (sedative). A medicine to numb the area (local anesthetic). A medicine to make you fall asleep (general anesthetic). A medicine that is injected into an area of your body to numb everything below the injection site (regional anesthetic). An incision will be made on the inner side of your arm. A vein and an artery will be opened and connected with stitches (  sutures). The incision will be closed with sutures or clips. A bandage (dressing) will be placed over the area. The procedure may vary among health care providers and hospitals. What happens  after the procedure? Your blood pressure, heart rate, breathing rate, and blood oxygen level may be monitored until you leave the hospital or clinic. Your fistula site will be checked for bleeding or swelling. You will be given pain medicine as needed. If you were given a sedative during the procedure, it can affect you for several hours. Do not drive or operate machinery until your health care provider says that it is safe. Summary Arteriovenous (AV) fistula placement is a surgical procedure to create a connection between a blood vessel that carries blood away from your heart (artery) and a blood vessel that returns blood to your heart (vein). This connection is called a fistula. Follow instructions from your health care provider about eating and drinking before the procedure. Ask your health care provider about changing or stopping your regular medicines before the procedure. This is especially important if you are taking diabetes medicines or blood thinners. Plan to have a responsible adult take you home from the hospital or clinic. This information is not intended to replace advice given to you by your health care provider. Make sure you discuss any questions you have with your health care provider. Document Revised: 07/29/2019 Document Reviewed: 07/29/2019 Elsevier Patient Education  2023 [employer] Inc.  

## 2021-12-08 NOTE — Assessment & Plan Note (Signed)
lipid control important in reducing the progression of atherosclerotic disease. Continue statin therapy  

## 2021-12-09 DIAGNOSIS — N186 End stage renal disease: Secondary | ICD-10-CM | POA: Diagnosis not present

## 2021-12-09 DIAGNOSIS — Z992 Dependence on renal dialysis: Secondary | ICD-10-CM | POA: Diagnosis not present

## 2021-12-11 DIAGNOSIS — Z992 Dependence on renal dialysis: Secondary | ICD-10-CM | POA: Diagnosis not present

## 2021-12-11 DIAGNOSIS — I120 Hypertensive chronic kidney disease with stage 5 chronic kidney disease or end stage renal disease: Secondary | ICD-10-CM | POA: Diagnosis not present

## 2021-12-11 DIAGNOSIS — N186 End stage renal disease: Secondary | ICD-10-CM | POA: Diagnosis not present

## 2021-12-12 DIAGNOSIS — N186 End stage renal disease: Secondary | ICD-10-CM | POA: Diagnosis not present

## 2021-12-12 DIAGNOSIS — Z992 Dependence on renal dialysis: Secondary | ICD-10-CM | POA: Diagnosis not present

## 2021-12-13 ENCOUNTER — Encounter (INDEPENDENT_AMBULATORY_CARE_PROVIDER_SITE_OTHER): Payer: Self-pay | Admitting: Vascular Surgery

## 2021-12-14 DIAGNOSIS — Z992 Dependence on renal dialysis: Secondary | ICD-10-CM | POA: Diagnosis not present

## 2021-12-14 DIAGNOSIS — N186 End stage renal disease: Secondary | ICD-10-CM | POA: Diagnosis not present

## 2021-12-14 NOTE — Telephone Encounter (Signed)
Message has been address

## 2021-12-16 DIAGNOSIS — N186 End stage renal disease: Secondary | ICD-10-CM | POA: Diagnosis not present

## 2021-12-16 DIAGNOSIS — Z992 Dependence on renal dialysis: Secondary | ICD-10-CM | POA: Diagnosis not present

## 2021-12-19 ENCOUNTER — Telehealth (INDEPENDENT_AMBULATORY_CARE_PROVIDER_SITE_OTHER): Payer: Self-pay

## 2021-12-19 DIAGNOSIS — N186 End stage renal disease: Secondary | ICD-10-CM | POA: Diagnosis not present

## 2021-12-19 DIAGNOSIS — Z992 Dependence on renal dialysis: Secondary | ICD-10-CM | POA: Diagnosis not present

## 2021-12-20 NOTE — Telephone Encounter (Signed)
Spoke with the patient and his spouse, the patient is scheduled with Dr. Lucky Cowboy on 01/04/22 at the [facility] for a left arm AV fistula. Pre-op phone call is son 12/27/21 between 8-1 pm. Pre-surgical instructions were discussed and will be mailed.

## 2021-12-23 DIAGNOSIS — Z992 Dependence on renal dialysis: Secondary | ICD-10-CM | POA: Diagnosis not present

## 2021-12-23 DIAGNOSIS — N186 End stage renal disease: Secondary | ICD-10-CM | POA: Diagnosis not present

## 2021-12-26 ENCOUNTER — Inpatient Hospital Stay: Admission: RE | Admit: 2021-12-26 | Payer: Medicare HMO | Source: Ambulatory Visit

## 2021-12-26 ENCOUNTER — Other Ambulatory Visit (INDEPENDENT_AMBULATORY_CARE_PROVIDER_SITE_OTHER): Payer: Self-pay | Admitting: Nurse Practitioner

## 2021-12-26 DIAGNOSIS — Z992 Dependence on renal dialysis: Secondary | ICD-10-CM | POA: Diagnosis not present

## 2021-12-26 DIAGNOSIS — N186 End stage renal disease: Secondary | ICD-10-CM | POA: Diagnosis not present

## 2021-12-27 ENCOUNTER — Other Ambulatory Visit: Payer: Self-pay

## 2021-12-27 ENCOUNTER — Encounter
Admission: RE | Admit: 2021-12-27 | Discharge: 2021-12-27 | Disposition: A | Discharge status: Home / Self Care | Payer: Medicare HMO | Source: Ambulatory Visit | Attending: Vascular Surgery | Admitting: Vascular Surgery

## 2021-12-27 ENCOUNTER — Encounter: Payer: Self-pay | Admitting: Anesthesiology

## 2021-12-27 VITALS — Ht 70.0 in | Wt 175.0 lb

## 2021-12-27 DIAGNOSIS — Z01818 Encounter for other preprocedural examination: Secondary | ICD-10-CM | POA: Diagnosis present

## 2021-12-27 DIAGNOSIS — Z01810 Encounter for preprocedural cardiovascular examination: Secondary | ICD-10-CM | POA: Diagnosis not present

## 2021-12-27 DIAGNOSIS — N186 End stage renal disease: Secondary | ICD-10-CM

## 2021-12-27 LAB — CBC WITH DIFFERENTIAL/PLATELET
Abs Immature Granulocytes: 0.02 10*3/uL (ref 0.00–0.07)
Basophils Absolute: 0 10*3/uL (ref 0.0–0.1)
Basophils Relative: 0 %
Eosinophils Absolute: 0.2 10*3/uL (ref 0.0–0.5)
Eosinophils Relative: 4 %
HCT: 29.6 % — ABNORMAL LOW (ref 39.0–52.0)
Hemoglobin: 10.2 g/dL — ABNORMAL LOW (ref 13.0–17.0)
Immature Granulocytes: 0 %
Lymphocytes Relative: 18 %
Lymphs Abs: 1 10*3/uL (ref 0.7–4.0)
MCH: 35.1 pg — ABNORMAL HIGH (ref 26.0–34.0)
MCHC: 34.5 g/dL (ref 30.0–36.0)
MCV: 101.7 fL — ABNORMAL HIGH (ref 80.0–100.0)
Monocytes Absolute: 0.4 10*3/uL (ref 0.1–1.0)
Monocytes Relative: 7 %
Neutro Abs: 3.9 10*3/uL (ref 1.7–7.7)
Neutrophils Relative %: 71 %
Platelets: 166 10*3/uL (ref 150–400)
RBC: 2.91 MIL/uL — ABNORMAL LOW (ref 4.22–5.81)
RDW: 12.8 % (ref 11.5–15.5)
WBC: 5.6 10*3/uL (ref 4.0–10.5)
nRBC: 0 % (ref 0.0–0.2)

## 2021-12-27 LAB — TYPE AND SCREEN
ABO/RH(D): O POS
Antibody Screen: NEGATIVE

## 2021-12-27 LAB — BASIC METABOLIC PANEL
Anion gap: 12 (ref 5–15)
BUN: 35 mg/dL — ABNORMAL HIGH (ref 8–23)
CO2: 29 mmol/L (ref 22–32)
Calcium: 8.9 mg/dL (ref 8.9–10.3)
Chloride: 98 mmol/L (ref 98–111)
Creatinine, Ser: 3.36 mg/dL — ABNORMAL HIGH (ref 0.61–1.24)
GFR, Estimated: 18 mL/min — ABNORMAL LOW (ref >60)
Glucose, Bld: 100 mg/dL — ABNORMAL HIGH (ref 70–99)
Potassium: 3.5 mmol/L (ref 3.5–5.1)
Sodium: 139 mmol/L (ref 135–145)

## 2021-12-27 NOTE — Progress Notes (Signed)
Today's EKG showing atrial flutter; reviewed by Dr. Andree Elk (anesthesia). Cleared by cardiology (Dr. Nehemiah Massed) as well. Ok to proceed with upcoming surgery.

## 2021-12-27 NOTE — Patient Instructions (Signed)
Your procedure is scheduled on: 01/04/22 Report to [facility]. To find out your arrival time please call [phone] between 1PM - 3PM on 01/03/22.  Remember: Instructions that are not followed completely may result in serious medical risk, up to and including death, or upon the discretion of your surgeon and anesthesiologist your surgery may need to be rescheduled.     _X__ 1. Do not eat food or drink any liquids after midnight the night before your procedure.                 No gum chewing or hard candies.   __X__2.  On the morning of surgery brush your teeth with toothpaste and water, you                 may rinse your mouth with mouthwash if you wish.  Do not swallow any              toothpaste of mouthwash.     _X__ 3.  No Alcohol for 24 hours before or after surgery.   _X__ 4.  Do Not Smoke or use e-cigarettes For 24 Hours Prior to Your Surgery.                 Do not use any chewable tobacco products for at least 6 hours prior to                 surgery.  ____  5.  Bring all medications with you on the day of surgery if instructed.   __X__  6.  Notify your doctor if there is any change in your medical condition      (cold, fever, infections).     Do not wear jewelry, make-up, hairpins, clips or nail polish. Do not wear lotions, powders, or perfumes. Do not wear deodorant Do not shave body hair 48 hours prior to surgery. Men may shave face and neck. Do not bring valuables to the hospital.    [facility] is not responsible for any belongings or valuables.  Contacts, dentures/partials or body piercings may not be worn into surgery. Bring a case for your contacts, glasses or hearing aids, a denture cup will be supplied. Leave your suitcase in the car. After surgery it may be brought to your room. For patients admitted to the hospital, discharge time is determined by your treatment team.   Patients discharged the day of surgery  will not be allowed to drive home.   Please read over the following fact sheets that you were given:   MRSA Information, Sage wipes  __X__ Take these medicines the morning of surgery with A SIP OF WATER:    1. carvedilol (COREG) 3.125 MG tablet   2. tamsulosin (FLOMAX) 0.4 MG CAPS capsule   3.   4.  5.  6.  ____ Fleet Enema (as directed)   __X__ Use CHG Soap/SAGE wipes as directed  ____ Use inhalers on the day of surgery  ____ Stop metformin/Janumet/Farxiga 2 days prior to surgery    ____ Take 1/2 of usual insulin dose the night before surgery. No insulin the morning          of surgery.   __X__ You may hold your Eliquis for 2 days prior to your procedure.  __X__ Stop Anti-inflammatories 7 days before surgery such as Advil, Ibuprofen, Motrin,  BC or Goodies Powder, Naprosyn, Naproxen, Aleve, Aspirin    __X__ Stop all herbals  and supplements, fish oil or vitamins E until after surgery.    ____ Bring C-Pap to the hospital.

## 2021-12-28 DIAGNOSIS — Z992 Dependence on renal dialysis: Secondary | ICD-10-CM | POA: Diagnosis not present

## 2021-12-28 DIAGNOSIS — N186 End stage renal disease: Secondary | ICD-10-CM | POA: Diagnosis not present

## 2021-12-30 DIAGNOSIS — N186 End stage renal disease: Secondary | ICD-10-CM | POA: Diagnosis not present

## 2021-12-30 DIAGNOSIS — Z992 Dependence on renal dialysis: Secondary | ICD-10-CM | POA: Diagnosis not present

## 2021-12-31 DIAGNOSIS — Z992 Dependence on renal dialysis: Secondary | ICD-10-CM | POA: Diagnosis not present

## 2021-12-31 DIAGNOSIS — N186 End stage renal disease: Secondary | ICD-10-CM | POA: Diagnosis not present

## 2022-01-02 DIAGNOSIS — Z992 Dependence on renal dialysis: Secondary | ICD-10-CM | POA: Diagnosis not present

## 2022-01-02 DIAGNOSIS — N186 End stage renal disease: Secondary | ICD-10-CM | POA: Diagnosis not present

## 2022-01-03 DIAGNOSIS — I129 Hypertensive chronic kidney disease with stage 1 through stage 4 chronic kidney disease, or unspecified chronic kidney disease: Secondary | ICD-10-CM | POA: Diagnosis not present

## 2022-01-03 DIAGNOSIS — N186 End stage renal disease: Secondary | ICD-10-CM | POA: Diagnosis not present

## 2022-01-03 DIAGNOSIS — I2510 Atherosclerotic heart disease of native coronary artery without angina pectoris: Secondary | ICD-10-CM | POA: Diagnosis not present

## 2022-01-03 DIAGNOSIS — J810 Acute pulmonary edema: Secondary | ICD-10-CM | POA: Diagnosis not present

## 2022-01-03 DIAGNOSIS — I5022 Chronic systolic (congestive) heart failure: Secondary | ICD-10-CM | POA: Diagnosis not present

## 2022-01-03 DIAGNOSIS — D631 Anemia in chronic kidney disease: Secondary | ICD-10-CM | POA: Diagnosis not present

## 2022-01-03 DIAGNOSIS — N184 Chronic kidney disease, stage 4 (severe): Secondary | ICD-10-CM | POA: Diagnosis not present

## 2022-01-03 DIAGNOSIS — Z992 Dependence on renal dialysis: Secondary | ICD-10-CM | POA: Diagnosis not present

## 2022-01-03 DIAGNOSIS — I700 Atherosclerosis of aorta: Secondary | ICD-10-CM | POA: Diagnosis not present

## 2022-01-03 DIAGNOSIS — I4821 Permanent atrial fibrillation: Secondary | ICD-10-CM | POA: Diagnosis not present

## 2022-01-03 MED ORDER — CHLORHEXIDINE GLUCONATE CLOTH 2 % EX PADS: 6.0000 | MEDICATED_PAD | Freq: Once | CUTANEOUS | Status: DC

## 2022-01-03 MED ORDER — CHLORHEXIDINE GLUCONATE 0.12 % MT SOLN: 15.0000 mL | Freq: Once | OROMUCOSAL | Status: AC

## 2022-01-03 MED ORDER — CEFAZOLIN SODIUM-DEXTROSE 2-4 GM/100ML-% IV SOLN
2.0000 g | INTRAVENOUS | Status: AC
  Administered 2022-01-04: 2 g via INTRAVENOUS

## 2022-01-03 MED ORDER — FAMOTIDINE 20 MG PO TABS: 20.0000 mg | ORAL_TABLET | Freq: Once | ORAL | Status: AC

## 2022-01-03 MED ORDER — ORAL CARE MOUTH RINSE: 15.0000 mL | Freq: Once | OROMUCOSAL | Status: AC

## 2022-01-03 MED ORDER — SODIUM CHLORIDE 0.9 % IV SOLN: INTRAVENOUS | Status: DC

## 2022-01-04 ENCOUNTER — Ambulatory Visit: Payer: Medicare HMO | Admitting: Certified Registered"

## 2022-01-04 ENCOUNTER — Other Ambulatory Visit: Payer: Self-pay

## 2022-01-04 ENCOUNTER — Encounter: Payer: Self-pay | Admitting: Vascular Surgery

## 2022-01-04 ENCOUNTER — Ambulatory Visit
Admission: RE | Admit: 2022-01-04 | Discharge: 2022-01-04 | Disposition: A | Discharge status: Home / Self Care | Payer: Medicare HMO | Source: Ambulatory Visit | Attending: Vascular Surgery | Admitting: Vascular Surgery

## 2022-01-04 ENCOUNTER — Encounter
Admission: RE | Disposition: A | Discharge status: Home / Self Care | Payer: Self-pay | Source: Ambulatory Visit | Attending: Vascular Surgery

## 2022-01-04 DIAGNOSIS — Z7901 Long term (current) use of anticoagulants: Secondary | ICD-10-CM | POA: Insufficient documentation

## 2022-01-04 DIAGNOSIS — I2510 Atherosclerotic heart disease of native coronary artery without angina pectoris: Secondary | ICD-10-CM | POA: Insufficient documentation

## 2022-01-04 DIAGNOSIS — Z87891 Personal history of nicotine dependence: Secondary | ICD-10-CM | POA: Insufficient documentation

## 2022-01-04 DIAGNOSIS — N186 End stage renal disease: Secondary | ICD-10-CM | POA: Diagnosis not present

## 2022-01-04 DIAGNOSIS — E785 Hyperlipidemia, unspecified: Secondary | ICD-10-CM | POA: Insufficient documentation

## 2022-01-04 DIAGNOSIS — I4891 Unspecified atrial fibrillation: Secondary | ICD-10-CM | POA: Diagnosis not present

## 2022-01-04 DIAGNOSIS — J449 Chronic obstructive pulmonary disease, unspecified: Secondary | ICD-10-CM | POA: Diagnosis not present

## 2022-01-04 DIAGNOSIS — I120 Hypertensive chronic kidney disease with stage 5 chronic kidney disease or end stage renal disease: Secondary | ICD-10-CM | POA: Diagnosis not present

## 2022-01-04 DIAGNOSIS — Z992 Dependence on renal dialysis: Secondary | ICD-10-CM | POA: Diagnosis not present

## 2022-01-04 HISTORY — PX: AV FISTULA PLACEMENT: SHX1204

## 2022-01-04 LAB — POCT I-STAT, CHEM 8
BUN: 27 mg/dL — ABNORMAL HIGH (ref 8–23)
Calcium, Ion: 1.16 mmol/L (ref 1.15–1.40)
Chloride: 96 mmol/L — ABNORMAL LOW (ref 98–111)
Creatinine, Ser: 3.8 mg/dL — ABNORMAL HIGH (ref 0.61–1.24)
Glucose, Bld: 91 mg/dL (ref 70–99)
HCT: 33 % — ABNORMAL LOW (ref 39.0–52.0)
Hemoglobin: 11.2 g/dL — ABNORMAL LOW (ref 13.0–17.0)
Potassium: 3.7 mmol/L (ref 3.5–5.1)
Sodium: 135 mmol/L (ref 135–145)
TCO2: 29 mmol/L (ref 22–32)

## 2022-01-04 SURGERY — ARTERIOVENOUS (AV) FISTULA CREATION
Anesthesia: General | Laterality: Left

## 2022-01-04 MED ORDER — ONDANSETRON HCL 4 MG/2ML IJ SOLN: 4.0000 mg | Freq: Once | INTRAMUSCULAR | Status: DC | PRN

## 2022-01-04 MED ORDER — PHENYLEPHRINE HCL-NACL 20-0.9 MG/250ML-% IV SOLN
INTRAVENOUS | Status: DC | PRN
  Administered 2022-01-04: 50 ug/min via INTRAVENOUS

## 2022-01-04 MED ORDER — PAPAVERINE HCL 30 MG/ML IJ SOLN
INTRAMUSCULAR | Status: AC
  Filled 2022-01-04: qty 2

## 2022-01-04 MED ORDER — ONDANSETRON HCL 4 MG/2ML IJ SOLN
INTRAMUSCULAR | Status: AC
  Filled 2022-01-04: qty 2

## 2022-01-04 MED ORDER — HYDROCODONE-ACETAMINOPHEN 5-325 MG PO TABS
1.0000 | ORAL_TABLET | ORAL | Status: DC | PRN
  Administered 2022-01-04: 1 via ORAL

## 2022-01-04 MED ORDER — FENTANYL CITRATE (PF) 100 MCG/2ML IJ SOLN: 25.0000 ug | INTRAMUSCULAR | Status: DC | PRN

## 2022-01-04 MED ORDER — HYDROCODONE-ACETAMINOPHEN 5-325 MG PO TABS
ORAL_TABLET | ORAL | Status: AC
  Filled 2022-01-04: qty 1

## 2022-01-04 MED ORDER — PHENYLEPHRINE HCL (PRESSORS) 10 MG/ML IV SOLN
INTRAVENOUS | Status: AC
  Filled 2022-01-04: qty 1

## 2022-01-04 MED ORDER — FENTANYL CITRATE (PF) 100 MCG/2ML IJ SOLN
INTRAMUSCULAR | Status: AC
  Filled 2022-01-04: qty 2

## 2022-01-04 MED ORDER — LIDOCAINE HCL (CARDIAC) PF 100 MG/5ML IV SOSY
PREFILLED_SYRINGE | INTRAVENOUS | Status: DC | PRN
  Administered 2022-01-04: 60 mg via INTRAVENOUS

## 2022-01-04 MED ORDER — HEPARIN SODIUM (PORCINE) 5000 UNIT/ML IJ SOLN
INTRAMUSCULAR | Status: AC
  Filled 2022-01-04: qty 1

## 2022-01-04 MED ORDER — ACETAMINOPHEN 10 MG/ML IV SOLN
INTRAVENOUS | Status: AC
  Filled 2022-01-04: qty 100

## 2022-01-04 MED ORDER — ONDANSETRON HCL 4 MG/2ML IJ SOLN: 4.0000 mg | Freq: Four times a day (QID) | INTRAMUSCULAR | Status: DC | PRN

## 2022-01-04 MED ORDER — PROPOFOL 10 MG/ML IV BOLUS
INTRAVENOUS | Status: DC | PRN
  Administered 2022-01-04 (×3): 30 mg via INTRAVENOUS
  Administered 2022-01-04: 110 mg via INTRAVENOUS

## 2022-01-04 MED ORDER — FAMOTIDINE 20 MG PO TABS
ORAL_TABLET | ORAL | Status: AC
  Administered 2022-01-04: 20 mg via ORAL
  Filled 2022-01-04: qty 1

## 2022-01-04 MED ORDER — HEPARIN 5000 UNITS IN NS 1000 ML (FLUSH)
INTRAMUSCULAR | Status: DC | PRN
  Administered 2022-01-04: 1 via INTRAMUSCULAR

## 2022-01-04 MED ORDER — CHLORHEXIDINE GLUCONATE 0.12 % MT SOLN
OROMUCOSAL | Status: AC
  Administered 2022-01-04: 15 mL via OROMUCOSAL
  Filled 2022-01-04: qty 15

## 2022-01-04 MED ORDER — HEPARIN SODIUM (PORCINE) 1000 UNIT/ML IJ SOLN
INTRAMUSCULAR | Status: DC | PRN
  Administered 2022-01-04: 3000 [IU] via INTRAVENOUS

## 2022-01-04 MED ORDER — HYDROCODONE-ACETAMINOPHEN 5-325 MG PO TABS: 2.0000 | ORAL_TABLET | Freq: Four times a day (QID) | ORAL | 0 refills | Status: DC | PRN

## 2022-01-04 MED ORDER — CEFAZOLIN SODIUM-DEXTROSE 2-4 GM/100ML-% IV SOLN
INTRAVENOUS | Status: AC
  Filled 2022-01-04: qty 100

## 2022-01-04 MED ORDER — FENTANYL CITRATE (PF) 100 MCG/2ML IJ SOLN
INTRAMUSCULAR | Status: DC | PRN
  Administered 2022-01-04 (×4): 25 ug via INTRAVENOUS

## 2022-01-04 MED ORDER — BUPIVACAINE-EPINEPHRINE (PF) 0.5% -1:200000 IJ SOLN
INTRAMUSCULAR | Status: AC
  Filled 2022-01-04: qty 30

## 2022-01-04 MED ORDER — HYDROMORPHONE HCL 1 MG/ML IJ SOLN: 1.0000 mg | Freq: Once | INTRAMUSCULAR | Status: DC | PRN

## 2022-01-04 MED ORDER — LIDOCAINE HCL (PF) 2 % IJ SOLN
INTRAMUSCULAR | Status: AC
  Filled 2022-01-04: qty 5

## 2022-01-04 SURGICAL SUPPLY — 53 items
ADH SKN CLS APL DERMABOND .7 (GAUZE/BANDAGES/DRESSINGS) ×1
APL PRP STRL LF DISP 70% ISPRP (MISCELLANEOUS) ×1
BAG DECANTER FOR FLEXI CONT (MISCELLANEOUS) ×1 IMPLANT
BLADE SURG SZ11 CARB STEEL (BLADE) ×1 IMPLANT
BOOT SUTURE AID YELLOW STND (SUTURE) ×1 IMPLANT
BRUSH SCRUB EZ  4% CHG (MISCELLANEOUS) ×1
BRUSH SCRUB EZ 4% CHG (MISCELLANEOUS) ×1 IMPLANT
CHLORAPREP W/TINT 26 (MISCELLANEOUS) ×1 IMPLANT
CLIP SPRNG 6 S-JAW DBL (CLIP) ×1 IMPLANT
CLIP SPRNG 6MM S-JAW DBL (CLIP) ×1
DERMABOND ADVANCED .7 DNX12 (GAUZE/BANDAGES/DRESSINGS) ×1 IMPLANT
ELECT CAUTERY BLADE 6.4 (BLADE) ×1 IMPLANT
ELECT REM PT RETURN 9FT ADLT (ELECTROSURGICAL) ×1
ELECTRODE REM PT RTRN 9FT ADLT (ELECTROSURGICAL) ×1 IMPLANT
GEL ULTRASOUND 20GR AQUASONIC (MISCELLANEOUS) IMPLANT
GLOVE BIO SURGEON STRL SZ7 (GLOVE) ×2 IMPLANT
GOWN STRL REUS W/ TWL LRG LVL3 (GOWN DISPOSABLE) ×2 IMPLANT
GOWN STRL REUS W/ TWL XL LVL3 (GOWN DISPOSABLE) ×2 IMPLANT
GOWN STRL REUS W/TWL LRG LVL3 (GOWN DISPOSABLE) ×2
GOWN STRL REUS W/TWL XL LVL3 (GOWN DISPOSABLE) ×2
HEMOSTAT SURGICEL 2X3 (HEMOSTASIS) ×1 IMPLANT
IV NS 500ML (IV SOLUTION) ×1
IV NS 500ML BAXH (IV SOLUTION) ×1 IMPLANT
KIT TURNOVER KIT A (KITS) ×1 IMPLANT
LABEL OR SOLS (LABEL) ×1 IMPLANT
LOOP RED MAXI  1X406MM (MISCELLANEOUS) ×2
LOOP VESSEL MAXI 1X406 RED (MISCELLANEOUS) ×1 IMPLANT
LOOP VESSEL MINI 0.8X406 BLUE (MISCELLANEOUS) ×1 IMPLANT
LOOPS BLUE MINI 0.8X406MM (MISCELLANEOUS) ×1
MANIFOLD NEPTUNE II (INSTRUMENTS) ×1 IMPLANT
NDL FILTER BLUNT 18X1 1/2 (NEEDLE) ×1 IMPLANT
NEEDLE FILTER BLUNT 18X1 1/2 (NEEDLE) ×1 IMPLANT
NS IRRIG 500ML POUR BTL (IV SOLUTION) ×1 IMPLANT
PACK EXTREMITY ARMC (MISCELLANEOUS) ×1 IMPLANT
PAD PREP 24X41 OB/GYN DISP (PERSONAL CARE ITEMS) ×1 IMPLANT
SOLUTION CELL SAVER (CLIP) ×1 IMPLANT
STOCKINETTE 48X4 2 PLY STRL (GAUZE/BANDAGES/DRESSINGS) ×1 IMPLANT
STOCKINETTE STRL 4IN 9604848 (GAUZE/BANDAGES/DRESSINGS) ×1 IMPLANT
SUT MNCRL AB 4-0 PS2 18 (SUTURE) ×1 IMPLANT
SUT PROLENE 6 0 BV (SUTURE) ×4 IMPLANT
SUT SILK 2 0 (SUTURE) ×1
SUT SILK 2-0 18XBRD TIE 12 (SUTURE) ×1 IMPLANT
SUT SILK 3 0 (SUTURE) ×1
SUT SILK 3-0 18XBRD TIE 12 (SUTURE) ×1 IMPLANT
SUT SILK 4 0 (SUTURE) ×1
SUT SILK 4-0 18XBRD TIE 12 (SUTURE) ×1 IMPLANT
SUT VIC AB 3-0 SH 27 (SUTURE) ×1
SUT VIC AB 3-0 SH 27X BRD (SUTURE) ×1 IMPLANT
SYR 20ML LL LF (SYRINGE) ×1 IMPLANT
SYR 3ML LL SCALE MARK (SYRINGE) ×1 IMPLANT
SYR TB 1ML 27GX1/2 LL (SYRINGE) IMPLANT
TRAP FLUID SMOKE EVACUATOR (MISCELLANEOUS) ×1 IMPLANT
WATER STERILE IRR 500ML POUR (IV SOLUTION) ×1 IMPLANT

## 2022-01-04 NOTE — Transfer of Care (Signed)
Immediate Anesthesia Transfer of Care Note  Patient: Lawrence Perkins.  Procedure(s) Performed: ARTERIOVENOUS (AV) FISTULA CREATION (Left)  Patient Location: PACU  Anesthesia Type:General  Level of Consciousness: awake, alert , and oriented  Airway & Oxygen Therapy: Patient Spontanous Breathing and Patient connected to face mask oxygen  Post-op Assessment: Report given to RN and Post -op Vital signs reviewed and stable  Post vital signs: Reviewed and stable  Last Vitals:  Vitals Value Taken Time  BP 113/65 01/04/22 1438  Temp 36.4 C 01/04/22 1438  Pulse 60 01/04/22 1440  Resp 15 01/04/22 1440  SpO2 100 % 01/04/22 1440  Vitals shown include unvalidated device data.  Last Pain:  Vitals:   01/04/22 1113  TempSrc: Temporal  PainSc: 0-No pain         Complications: No notable events documented.

## 2022-01-04 NOTE — Op Note (Signed)
[facility]   OPERATIVE NOTE   PROCEDURE: Left brachiocephalic arteriovenous fistula placement  PRE-OPERATIVE DIAGNOSIS: 1.  ESRD        POST-OPERATIVE DIAGNOSIS: 1. ESRD       SURGEON: Leotis Pain, MD  ASSISTANT(S): Annalee Genta, NP  ANESTHESIA: general  ESTIMATED BLOOD LOSS: 10 cc  FINDING(S): Adequate cephalic vein for fistula creation  SPECIMEN(S):  none  INDICATIONS:   Lawrence Perkins. is a 82 y.o. male who presents with renal failure in need of pemanent dialysis acces.  The patient is scheduled for left arm AVF placement.  The patient is aware the risks include but are not limited to: bleeding, infection, steal syndrome, nerve damage, ischemic monomelic neuropathy, failure to mature, and need for additional procedures.  The patient is aware of the risks of the procedure and elects to proceed forward.  An assistant was present during the procedure to help facilitate the exposure and expedite the procedure.   DESCRIPTION: After full informed written consent was obtained from the patient, the patient was brought back to the operating room and placed supine upon the operating table.  Prior to induction, the patient received IV antibiotics.  The assistant provided retraction and mobilization to help facilitate exposure and expedite the procedure throughout the entire procedure.  This included following suture, using retractors, and optimizing lighting.  After obtaining adequate anesthesia, the patient was then prepped and draped in the standard fashion for a left arm access procedure.  I made a curvilinear incision at the level of the antecubital fossa and dissected through the subcutaneous tissue and fascia to gain exposure of the brachial artery.  This was noted to be patent and adequate in size for fistula creation.  This was dissected out proximally and distally and prepared for control with vessel loops .  I then dissected out the cephalic vein.  This was  noted to be patent and adequate in size for fistula creation.  I then gave the patient 3000 units of intravenous heparin.  The vein was marked for orientation and the distal segment of the vein was ligated with a  2-0 silk, and the vein was transected.  I then instilled the heparinized saline into the vein and clamped it.  At this point, I reset my exposure of the brachial artery and pulled up control on the vessel loops.  I made an arteriotomy with a #11 blade, and then I extended the arteriotomy with a Potts scissor.  I injected heparinized saline proximal and distal to this arteriotomy.  The vein was then sewn to the artery in an end-to-side configuration with a running stitch of 6-0 Prolene.  Prior to completing this anastomosis, I allowed the vein and artery to backbleed.  There was no evidence of clot from any vessels.  I completed the anastomosis in the usual fashion and then released all vessel loops and clamps.  There was a palpable  thrill in the venous outflow, and there was a palpable pulse in the artery distal to the anastomosis.  At this point, I irrigated out the surgical wound.  Surgicel was placed. There was no further active bleeding.  The subcutaneous tissue was reapproximated with a running stitch of 3-0 Vicryl.  The skin was then closed with a 4-0 Monocryl suture.  The skin was then cleaned, dried, and reinforced with Dermabond.  The patient tolerated this procedure well and was taken to the recovery room in stable condition  COMPLICATIONS: None  CONDITION: Stable  Leotis Pain    01/04/2022, 2:20 PM  This note was created with Dragon Medical transcription system. Any errors in dictation are purely unintentional.

## 2022-01-04 NOTE — Progress Notes (Signed)
Per Dr. Lucky Cowboy, patient can restart home medications tomorrow (Jan 05 2022) including eliquis.

## 2022-01-04 NOTE — Anesthesia Preprocedure Evaluation (Signed)
Anesthesia Evaluation  Patient identified by MRN, date of birth, ID band Patient awake    Reviewed: Allergy & Precautions, NPO status , Patient's Chart, lab work & pertinent test results  History of Anesthesia Complications (+) PONV and history of anesthetic complications  Airway Mallampati: III  TM Distance: <3 FB Neck ROM: full    Dental  (+) Chipped, Poor Dentition, Dental Advidsory Given   Pulmonary neg shortness of breath, neg COPD, Recent URI , Residual Cough, former smoker   Pulmonary exam normal        Cardiovascular Exercise Tolerance: Good hypertension, (-) angina + CAD  (-) Past MI, (-) Cardiac Stents and (-) CHF Normal cardiovascular exam(-) dysrhythmias (-) Valvular Problems/Murmurs     Neuro/Psych negative neurological ROS  negative psych ROS   GI/Hepatic negative GI ROS, Neg liver ROS,,,  Endo/Other  negative endocrine ROS    Renal/GU ESRF and DialysisRenal diseaseLast dialyzed through PD cath yesterday, feels well from fluid standpoint today     Musculoskeletal   Abdominal   Peds  Hematology negative hematology ROS (+)   Anesthesia Other Findings Past Medical History: No date: Aortic atherosclerosis (HCC) No date: Arthritis No date: Atrial fibrillation and flutter (HCC)     Comment:  a.) CHA2DS2-VASc = 5 (age x 2, CHF, HTN, aortic plaque).              b.) rate/rhythm maintain on oral carvedilol; chronically               anticoagulated with dose reduced apixaban No date: Bilateral inguinal hernia No date: BPH (benign prostatic hyperplasia) No date: Cardiac syncope No date: Cardiomyopathy [facility])     Comment:  a.) TTE 04/23/2016: EF 40%. b.) TTE 09/03/2016: EF 50%.               c.) TTE 05/04/2019: EF >55%. No date: CHF (congestive heart failure) ([place])     Comment:  a.) TTE 04/23/2016: EF 40%; BAE; mild AR/TR, mod MR;               G1DD. b.) TTE 09/03/2016: EF 50%; mild LVH; BAE; triv                AR/PR, mod MR/TR. c.) TTE 05/04/2019: EF >55%; mod LVH,               mild LA enlargement; mild AR/MR/TR No date: CKD (chronic kidney disease), stage V (HCC) No date: Complication of anesthesia     Comment:  a.) h/o intra/postoperative A.fib No date: Coronary artery disease No date: Diverticulosis No date: Gout 11/14/2020: History of 2019 novel coronavirus disease (COVID-19) No date: History of colon polyps No date: Hypercholesteremia No date: Hypertension No date: Long term current use of anticoagulant     Comment:  a.) dose reduced apixaban; dose reduction d/t to age and              CKD Dx. 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[place])     Comment:  a.) s/p resection No date: MGUS (monoclonal gammopathy of unknown significance) No date: Pleural effusion No date: Rectal bleeding No date: Secondary hyperparathyroidism of renal origin [facility])  Past Surgical History: 1985: ANKLE SURGERY; Right     Comment:  following volleyball injury No date: APPENDECTOMY     Comment:  at the of 9 yrs No date: COLONOSCOPY 01/02/2017: COLONOSCOPY WITH PROPOFOL; N/A     Comment:  Procedure: COLONOSCOPY WITH PROPOFOL;  Surgeon: Vira Agar,  Gavin Pound, MD;  Location: [facility] ENDOSCOPY;  Service:               Endoscopy;  Laterality: N/A; 03/27/2021: DIALYSIS/PERMA CATHETER INSERTION; N/A     Comment:  Procedure: DIALYSIS/PERMA CATHETER INSERTION;  Surgeon:               Algernon Huxley, MD;  Location: [place] [facility];                Service: Cardiovascular;  Laterality: N/A; 05/08/2021: DIALYSIS/PERMA CATHETER REMOVAL; N/A     Comment:  Procedure: DIALYSIS/PERMA CATHETER REMOVAL;  Surgeon:               Algernon Huxley, MD;  Location: [place] [facility];                Service: Cardiovascular;  Laterality: N/A; No date: JOINT REPLACEMENT     Comment:  RIGHT TOTAL HIP 04/20/2021: KNEE ARTHROSCOPY WITH LATERAL RELEASE; Right     Comment:  Procedure: Right knee medial retinaculum and lateral                release with polyethylene exchange;  Surgeon: Hessie Knows, MD;  Location: [facility] ORS;  Service: Orthopedics;               Laterality: Right; No date: TONSILLECTOMY     Comment:  as a child 09/2009: TOTAL HIP ARTHROPLASTY     Comment:  Right femoral neck fracture 03/06/2016: TOTAL HIP ARTHROPLASTY; Left     Comment:  Procedure: TOTAL HIP ARTHROPLASTY ANTERIOR APPROACH;                Surgeon: Hessie Knows, MD;  Location: [facility] ORS;  Service:              Orthopedics;  Laterality: Left; 03/21/2021: TOTAL KNEE ARTHROPLASTY; Right     Comment:  Procedure: TOTAL KNEE ARTHROPLASTY;  Surgeon: Hessie Knows, MD;  Location: [facility] ORS;  Service: Orthopedics;               Laterality: Right;  BMI    Body Mass Index: 25.83 kg/m      Reproductive/Obstetrics negative OB ROS                             Anesthesia Physical Anesthesia Plan  ASA: 4  Anesthesia Plan: General   Post-op Pain Management: Tylenol PO (pre-op)*   Induction: Intravenous  PONV Risk Score and Plan: 4 or greater and Ondansetron, Dexamethasone and Treatment may vary due to age or medical condition  Airway Management Planned: LMA  Additional Equipment: None  Intra-op Plan:   Post-operative Plan: Extubation in OR  Informed Consent: I have reviewed the patients History and Physical, chart, labs and discussed the procedure including the risks, benefits and alternatives for the proposed anesthesia with the patient or authorized representative who has indicated his/her understanding and acceptance.     Dental Advisory Given  Plan Discussed with: Anesthesiologist, CRNA and Surgeon  Anesthesia Plan Comments: (Patient consented for risks of anesthesia including but not limited to:  - adverse reactions to medications - damage to eyes, teeth, lips or other oral mucosa - nerve damage due to positioning  - sore throat or hoarseness - Damage to heart, brain,  nerves, lungs, other parts of body or loss of life  Patient voiced understanding.)        Anesthesia Quick Evaluation

## 2022-01-04 NOTE — Discharge Instructions (Signed)
AMBULATORY SURGERY  DISCHARGE INSTRUCTIONS   The drugs that you were given will stay in your system until tomorrow so for the next 24 hours you should not:  Drive an automobile Make any legal decisions Drink any alcoholic beverage   You may resume regular meals tomorrow.  Today it is better to start with liquids and gradually work up to solid foods.  You may eat anything you prefer, but it is better to start with liquids, then soup and crackers, and gradually work up to solid foods.   Please notify your doctor immediately if you have any unusual bleeding, trouble breathing, redness and pain at the surgery site, drainage, fever, or pain not relieved by medication.    Additional Instructions:   AMBULATORY SURGERY  DISCHARGE INSTRUCTIONS   The drugs that you were given will stay in your system until tomorrow so for the next 24 hours you should not:  Drive an automobile Make any legal decisions Drink any alcoholic beverage   You may resume regular meals tomorrow.  Today it is better to start with liquids and gradually work up to solid foods.  You may eat anything you prefer, but it is better to start with liquids, then soup and crackers, and gradually work up to solid foods.   Please notify your doctor immediately if you have any unusual bleeding, trouble breathing, redness and pain at the surgery site, drainage, fever, or pain not relieved by medication.    Additional Instructions:   Please contact your physician with any problems or Same Day Surgery at [phone], Monday through Friday 6 am to 4 pm, or [place] at [place] Main number at [phone].  Please contact your physician with any problems or Same Day Surgery at [phone], Monday through Friday 6 am to 4 pm, or [place] at [place] Main number at [phone].  

## 2022-01-04 NOTE — Anesthesia Procedure Notes (Signed)
Procedure Name: LMA Insertion Date/Time: 01/04/2022 1:02 PM  Performed by: Abanoub Hanken, CRNAPre-anesthesia Checklist: Patient identified, Patient being monitored, Timeout performed, Emergency Drugs available and Suction available Patient Re-evaluated:Patient Re-evaluated prior to induction Oxygen Delivery Method: Circle system utilized Preoxygenation: Pre-oxygenation with 100% oxygen Induction Type: IV induction Ventilation: Mask ventilation without difficulty LMA: LMA inserted LMA Size: 4.0 Tube type: Oral Number of attempts: 1 Placement Confirmation: positive ETCO2 and breath sounds checked- equal and bilateral Tube secured with: Tape Dental Injury: Teeth and Oropharynx as per pre-operative assessment

## 2022-01-04 NOTE — Interval H&P Note (Signed)
History and Physical Interval Note:  01/04/2022 11:31 AM  Lawrence Perkins.  has presented today for surgery, with the diagnosis of ESRD.  The various methods of treatment have been discussed with the patient and family. After consideration of risks, benefits and other options for treatment, the patient has consented to  Procedure(s): ARTERIOVENOUS (AV) FISTULA CREATION (Left) as a surgical intervention.  The patient's history has been reviewed, patient examined, no change in status, stable for surgery.  I have reviewed the patient's chart and labs.  Questions were answered to the patient's satisfaction.     Leotis Pain

## 2022-01-05 ENCOUNTER — Encounter: Payer: Self-pay | Admitting: Vascular Surgery

## 2022-01-06 DIAGNOSIS — Z992 Dependence on renal dialysis: Secondary | ICD-10-CM | POA: Diagnosis not present

## 2022-01-06 DIAGNOSIS — N186 End stage renal disease: Secondary | ICD-10-CM | POA: Diagnosis not present

## 2022-01-08 DIAGNOSIS — Z992 Dependence on renal dialysis: Secondary | ICD-10-CM | POA: Diagnosis not present

## 2022-01-08 DIAGNOSIS — N186 End stage renal disease: Secondary | ICD-10-CM | POA: Diagnosis not present

## 2022-01-11 DIAGNOSIS — N186 End stage renal disease: Secondary | ICD-10-CM | POA: Diagnosis not present

## 2022-01-11 DIAGNOSIS — Z992 Dependence on renal dialysis: Secondary | ICD-10-CM | POA: Diagnosis not present

## 2022-01-11 NOTE — Anesthesia Postprocedure Evaluation (Signed)
Anesthesia Post Note  Patient: Lawrence Perkins.  Procedure(s) Performed: ARTERIOVENOUS (AV) FISTULA CREATION (Left)  Patient location during evaluation: PACU Anesthesia Type: General Level of consciousness: awake and alert Pain management: pain level controlled Vital Signs Assessment: post-procedure vital signs reviewed and stable Respiratory status: spontaneous breathing, nonlabored ventilation, respiratory function stable and patient connected to nasal cannula oxygen Cardiovascular status: blood pressure returned to baseline and stable Postop Assessment: no apparent nausea or vomiting Anesthetic complications: no   No notable events documented.   Last Vitals:  Vitals:   01/04/22 1515 01/04/22 1518  BP:  125/76  Pulse: (!) 48 66  Resp: 14 15  Temp:  (!) 36.4 C  SpO2: 99% 99%    Last Pain:  Vitals:   01/05/22 0830  TempSrc:   PainSc: 0-No pain                 Martha Clan

## 2022-01-13 DIAGNOSIS — Z992 Dependence on renal dialysis: Secondary | ICD-10-CM | POA: Diagnosis not present

## 2022-01-13 DIAGNOSIS — N186 End stage renal disease: Secondary | ICD-10-CM | POA: Diagnosis not present

## 2022-01-15 DIAGNOSIS — H353132 Nonexudative age-related macular degeneration, bilateral, intermediate dry stage: Secondary | ICD-10-CM | POA: Diagnosis not present

## 2022-01-15 DIAGNOSIS — H25811 Combined forms of age-related cataract, right eye: Secondary | ICD-10-CM | POA: Diagnosis not present

## 2022-01-16 DIAGNOSIS — N186 End stage renal disease: Secondary | ICD-10-CM | POA: Diagnosis not present

## 2022-01-16 DIAGNOSIS — Z992 Dependence on renal dialysis: Secondary | ICD-10-CM | POA: Diagnosis not present

## 2022-01-18 DIAGNOSIS — N186 End stage renal disease: Secondary | ICD-10-CM | POA: Diagnosis not present

## 2022-01-18 DIAGNOSIS — Z992 Dependence on renal dialysis: Secondary | ICD-10-CM | POA: Diagnosis not present

## 2022-01-20 DIAGNOSIS — Z992 Dependence on renal dialysis: Secondary | ICD-10-CM | POA: Diagnosis not present

## 2022-01-20 DIAGNOSIS — N186 End stage renal disease: Secondary | ICD-10-CM | POA: Diagnosis not present

## 2022-01-23 DIAGNOSIS — Z992 Dependence on renal dialysis: Secondary | ICD-10-CM | POA: Diagnosis not present

## 2022-01-23 DIAGNOSIS — N186 End stage renal disease: Secondary | ICD-10-CM | POA: Diagnosis not present

## 2022-01-25 DIAGNOSIS — N186 End stage renal disease: Secondary | ICD-10-CM | POA: Diagnosis not present

## 2022-01-25 DIAGNOSIS — Z992 Dependence on renal dialysis: Secondary | ICD-10-CM | POA: Diagnosis not present

## 2022-01-26 DIAGNOSIS — D225 Melanocytic nevi of trunk: Secondary | ICD-10-CM | POA: Diagnosis not present

## 2022-01-26 DIAGNOSIS — D2272 Melanocytic nevi of left lower limb, including hip: Secondary | ICD-10-CM | POA: Diagnosis not present

## 2022-01-26 DIAGNOSIS — Z85820 Personal history of malignant melanoma of skin: Secondary | ICD-10-CM | POA: Diagnosis not present

## 2022-01-26 DIAGNOSIS — D2262 Melanocytic nevi of left upper limb, including shoulder: Secondary | ICD-10-CM | POA: Diagnosis not present

## 2022-01-26 DIAGNOSIS — Z85828 Personal history of other malignant neoplasm of skin: Secondary | ICD-10-CM | POA: Diagnosis not present

## 2022-01-26 DIAGNOSIS — D2261 Melanocytic nevi of right upper limb, including shoulder: Secondary | ICD-10-CM | POA: Diagnosis not present

## 2022-01-27 DIAGNOSIS — Z992 Dependence on renal dialysis: Secondary | ICD-10-CM | POA: Diagnosis not present

## 2022-01-27 DIAGNOSIS — N186 End stage renal disease: Secondary | ICD-10-CM | POA: Diagnosis not present

## 2022-01-29 DIAGNOSIS — N186 End stage renal disease: Secondary | ICD-10-CM | POA: Diagnosis not present

## 2022-01-29 DIAGNOSIS — Z992 Dependence on renal dialysis: Secondary | ICD-10-CM | POA: Diagnosis not present

## 2022-01-31 ENCOUNTER — Other Ambulatory Visit: Payer: Self-pay

## 2022-01-31 ENCOUNTER — Emergency Department: Payer: Medicare HMO

## 2022-01-31 ENCOUNTER — Emergency Department
Admission: EM | Admit: 2022-01-31 | Discharge: 2022-01-31 | Disposition: A | Discharge status: Home / Self Care | Payer: Medicare HMO | Attending: Student in an Organized Health Care Education/Training Program | Admitting: Student in an Organized Health Care Education/Training Program

## 2022-01-31 DIAGNOSIS — Z1152 Encounter for screening for COVID-19: Secondary | ICD-10-CM | POA: Diagnosis not present

## 2022-01-31 DIAGNOSIS — R0902 Hypoxemia: Secondary | ICD-10-CM | POA: Diagnosis not present

## 2022-01-31 DIAGNOSIS — N186 End stage renal disease: Secondary | ICD-10-CM | POA: Diagnosis not present

## 2022-01-31 DIAGNOSIS — R58 Hemorrhage, not elsewhere classified: Secondary | ICD-10-CM | POA: Diagnosis not present

## 2022-01-31 DIAGNOSIS — Z7901 Long term (current) use of anticoagulants: Secondary | ICD-10-CM | POA: Insufficient documentation

## 2022-01-31 DIAGNOSIS — R531 Weakness: Secondary | ICD-10-CM | POA: Insufficient documentation

## 2022-01-31 DIAGNOSIS — N3001 Acute cystitis with hematuria: Secondary | ICD-10-CM | POA: Insufficient documentation

## 2022-01-31 DIAGNOSIS — Z992 Dependence on renal dialysis: Secondary | ICD-10-CM | POA: Diagnosis not present

## 2022-01-31 DIAGNOSIS — R5383 Other fatigue: Secondary | ICD-10-CM | POA: Diagnosis not present

## 2022-01-31 DIAGNOSIS — R509 Fever, unspecified: Secondary | ICD-10-CM | POA: Diagnosis not present

## 2022-01-31 LAB — COMPREHENSIVE METABOLIC PANEL
ALT: 12 U/L (ref 0–44)
AST: 14 U/L — ABNORMAL LOW (ref 15–41)
Albumin: 3.8 g/dL (ref 3.5–5.0)
Alkaline Phosphatase: 77 U/L (ref 38–126)
Anion gap: 15 (ref 5–15)
BUN: 37 mg/dL — ABNORMAL HIGH (ref 8–23)
CO2: 26 mmol/L (ref 22–32)
Calcium: 8.5 mg/dL — ABNORMAL LOW (ref 8.9–10.3)
Chloride: 92 mmol/L — ABNORMAL LOW (ref 98–111)
Creatinine, Ser: 4.21 mg/dL — ABNORMAL HIGH (ref 0.61–1.24)
GFR, Estimated: 13 mL/min — ABNORMAL LOW (ref >60)
Glucose, Bld: 137 mg/dL — ABNORMAL HIGH (ref 70–99)
Potassium: 3.7 mmol/L (ref 3.5–5.1)
Sodium: 133 mmol/L — ABNORMAL LOW (ref 135–145)
Total Bilirubin: 1.4 mg/dL — ABNORMAL HIGH (ref 0.3–1.2)
Total Protein: 7.4 g/dL (ref 6.5–8.1)

## 2022-01-31 LAB — PROTIME-INR
INR: 1.9 — ABNORMAL HIGH (ref 0.8–1.2)
Prothrombin Time: 21.5 seconds — ABNORMAL HIGH (ref 11.4–15.2)

## 2022-01-31 LAB — CBC WITH DIFFERENTIAL/PLATELET
Abs Immature Granulocytes: 0.03 10*3/uL (ref 0.00–0.07)
Basophils Absolute: 0 10*3/uL (ref 0.0–0.1)
Basophils Relative: 0 %
Eosinophils Absolute: 0 10*3/uL (ref 0.0–0.5)
Eosinophils Relative: 0 %
HCT: 30.8 % — ABNORMAL LOW (ref 39.0–52.0)
Hemoglobin: 10.7 g/dL — ABNORMAL LOW (ref 13.0–17.0)
Immature Granulocytes: 0 %
Lymphocytes Relative: 3 %
Lymphs Abs: 0.2 10*3/uL — ABNORMAL LOW (ref 0.7–4.0)
MCH: 35.1 pg — ABNORMAL HIGH (ref 26.0–34.0)
MCHC: 34.7 g/dL (ref 30.0–36.0)
MCV: 101 fL — ABNORMAL HIGH (ref 80.0–100.0)
Monocytes Absolute: 0.4 10*3/uL (ref 0.1–1.0)
Monocytes Relative: 6 %
Neutro Abs: 6.7 10*3/uL (ref 1.7–7.7)
Neutrophils Relative %: 91 %
Platelets: 139 10*3/uL — ABNORMAL LOW (ref 150–400)
RBC: 3.05 MIL/uL — ABNORMAL LOW (ref 4.22–5.81)
RDW: 13.4 % (ref 11.5–15.5)
WBC: 7.4 10*3/uL (ref 4.0–10.5)
nRBC: 0 % (ref 0.0–0.2)

## 2022-01-31 LAB — URINALYSIS, ROUTINE W REFLEX MICROSCOPIC
Bilirubin Urine: NEGATIVE
Glucose, UA: NEGATIVE mg/dL
Ketones, ur: NEGATIVE mg/dL
Nitrite: NEGATIVE
Protein, ur: >=300 mg/dL — AB
RBC / HPF: >50 RBC/hpf (ref 0–5)
Specific Gravity, Urine: 1.014 (ref 1.005–1.030)
WBC, UA: >50 WBC/hpf (ref 0–5)
pH: 5 (ref 5.0–8.0)

## 2022-01-31 LAB — RESP PANEL BY RT-PCR (RSV, FLU A&B, COVID)  RVPGX2
Influenza A by PCR: NEGATIVE
Influenza B by PCR: NEGATIVE
Resp Syncytial Virus by PCR: NEGATIVE
SARS Coronavirus 2 by RT PCR: NEGATIVE

## 2022-01-31 LAB — LACTIC ACID, PLASMA: Lactic Acid, Venous: 1.4 mmol/L (ref 0.5–1.9)

## 2022-01-31 MED ORDER — CEPHALEXIN 500 MG PO CAPS: 500.0000 mg | ORAL_CAPSULE | Freq: Once | ORAL | 0 refills | Status: AC

## 2022-01-31 MED ORDER — PROBIOTIC 250 MG PO CAPS: 1.0000 | ORAL_CAPSULE | Freq: Two times a day (BID) | ORAL | 0 refills | Status: AC

## 2022-01-31 MED ORDER — SODIUM CHLORIDE 0.9 % IV BOLUS
250.0000 mL | Freq: Once | INTRAVENOUS | Status: AC
  Administered 2022-01-31: 250 mL via INTRAVENOUS

## 2022-01-31 MED ORDER — SODIUM CHLORIDE 0.9 % IV SOLN
2.0000 g | INTRAVENOUS | Status: DC
  Administered 2022-01-31: 2 g via INTRAVENOUS
  Filled 2022-01-31: qty 20

## 2022-01-31 NOTE — ED Provider Notes (Signed)
[facility] Provider Note    Event Date/Time   First MD Initiated Contact with Patient 01/31/22 1752     (approximate)   History   Weakness (Patient reports 4 days of weakness and fatigue; EMS reported an axillary temp of 102; He has also been having some bleeding from his penis for 4 days as well (is on anticoagulants); HD patient, still makes urine; Sepsis order set initiated in triage)   HPI  Lawrence Perkins. is a 82 y.o. male presents to the ER for generalized weakness and fatigue over the past few days.  Reportedly had fever over the past few days.  Noted some bleeding from his penis and discharge.  Does still make urine.  States that he got up today go use the restroom and then while on the commode was unable to get up due to weakness.  He denies any chest pain no shortness of breath.  Has been compliant with his dialysis.  He denies any flank pain or abdominal pain.     Physical Exam   Triage Vital Signs: ED Triage Vitals [01/31/22 1745]  Enc Vitals Group     BP 103/60     Pulse Rate 67     Resp 15     Temp 98.1 F (36.7 C)     Temp Source Oral     SpO2 91 %     Weight 183 lb (83 kg)     Height '5\' 10"'$  (1.778 m)     Head Circumference      Peak Flow      Pain Score      Pain Loc      Pain Edu?      Excl. in [place]?     Most recent vital signs: Vitals:   01/31/22 2039 01/31/22 2044  BP: 110/69 125/66  Pulse: 84 87  Resp: 20   Temp:    SpO2: 99% 94%     Constitutional: Alert  Eyes: Conjunctivae are normal.  Head: Atraumatic. Nose: No congestion/rhinnorhea. Mouth/Throat: Mucous membranes are moist.   Neck: Painless ROM.  Cardiovascular:   Good peripheral circulation. Respiratory: Normal respiratory effort.  No retractions.  Gastrointestinal: Soft and nontender in all four quadrants Musculoskeletal:  no deformity Neurologic:  MAE spontaneously. No gross focal neurologic deficits are appreciated.  Skin:  Skin is warm, dry and  intact. No rash noted. Psychiatric: Mood and affect are normal. Speech and behavior are normal.    ED Results / Procedures / Treatments   Labs (all labs ordered are listed, but only abnormal results are displayed) Labs Reviewed  COMPREHENSIVE METABOLIC PANEL - Abnormal; Notable for the following components:      Result Value   Sodium 133 (*)    Chloride 92 (*)    Glucose, Bld 137 (*)    BUN 37 (*)    Creatinine, Ser 4.21 (*)    Calcium 8.5 (*)    AST 14 (*)    Total Bilirubin 1.4 (*)    GFR, Estimated 13 (*)    All other components within normal limits  CBC WITH DIFFERENTIAL/PLATELET - Abnormal; Notable for the following components:   RBC 3.05 (*)    Hemoglobin 10.7 (*)    HCT 30.8 (*)    MCV 101.0 (*)    MCH 35.1 (*)    Platelets 139 (*)    Lymphs Abs 0.2 (*)    All other components within normal limits  PROTIME-INR - Abnormal; Notable  for the following components:   Prothrombin Time 21.5 (*)    INR 1.9 (*)    All other components within normal limits  URINALYSIS, ROUTINE W REFLEX MICROSCOPIC - Abnormal; Notable for the following components:   Color, Urine YELLOW (*)    APPearance TURBID (*)    Hgb urine dipstick MODERATE (*)    Protein, ur >=300 (*)    Leukocytes,Ua LARGE (*)    Bacteria, UA MANY (*)    All other components within normal limits  RESP PANEL BY RT-PCR (RSV, FLU A&B, COVID)  [id number]  CULTURE, BLOOD (ROUTINE X 2)  CULTURE, BLOOD (ROUTINE X 2)  LACTIC ACID, PLASMA     EKG   RADIOLOGY Please see ED Course for my review and interpretation.  I personally reviewed all radiographic images ordered to evaluate for the above acute complaints and reviewed radiology reports and findings.  These findings were personally discussed with the patient.  Please see medical record for radiology report.    PROCEDURES:  Critical Care performed:   Procedures   MEDICATIONS ORDERED IN ED: Medications  cefTRIAXone (ROCEPHIN) 2 g in sodium chloride 0.9 % 100  mL IVPB (0 g Intravenous Stopped 01/31/22 1952)  sodium chloride 0.9 % bolus 250 mL (0 mLs Intravenous Stopped 01/31/22 2036)     IMPRESSION / MDM / ASSESSMENT AND PLAN / ED COURSE  I reviewed the triage vital signs and the nursing notes.                              Differential diagnosis includes, but is not limited to, Dehydration, sepsis, pna, uti, hypoglycemia, cva, drug effect, withdrawal, encephalitis  Patient presenting to the ER for evaluation of symptoms as described above.  Based on symptoms, risk factors and considered above differential, this presenting complaint could reflect a potentially life-threatening illness therefore the patient will be placed on continuous pulse oximetry and telemetry for monitoring.  Laboratory evaluation will be sent to evaluate for the above complaints.      Clinical Course as of 01/31/22 2107  Wed Jan 31, 2022  1848 Urinalysis is consistent with UTI.  Patient's temperature is rising we will check core temp.  Lactate normal.  No leukocytosis. [PR]  2105 Patient able to ambulate with steady gait.  No unsteadiness or discomfort.  Tolerating p.o.  Do suspect symptoms secondary to mild UTI not meeting criteria for sepsis.  Discussed option for admission to hospital for additional observation IV antibiotics and monitoring patient feels well and feels comfortable discharge home which I think is reasonable given his otherwise reassuring workup.  Discussed strict return precautions. [PR]    Clinical Course User Index [PR] Merlyn Lot, MD      FINAL CLINICAL IMPRESSION(S) / ED DIAGNOSES   Final diagnoses:  Weakness  Acute cystitis with hematuria     Rx / DC Orders   ED Discharge Orders          Ordered    cephALEXin (KEFLEX) 500 MG capsule   Once        01/31/22 2105    Saccharomyces boulardii (PROBIOTIC) 250 MG CAPS  2 times daily        01/31/22 2105             Note:  This document was prepared using Dragon voice recognition  software and may include unintentional dictation errors.    Merlyn Lot, MD 01/31/22 2107

## 2022-01-31 NOTE — ED Notes (Signed)
Pt ambulated in rm and down hall back to room without difficulty or complaints. VS stable. Walker used in case of need but patient did not need it.

## 2022-01-31 NOTE — ED Triage Notes (Signed)
Patient reports 4 days of weakness and fatigue; EMS reported an axillary temp of 102; He has also been having some bleeding from his penis for 4 days as well (is on anticoagulants); HD patient, still makes urine; Sepsis order set initiated in triage

## 2022-01-31 NOTE — ED Notes (Signed)
Pt arrived to room. Xray at bedside

## 2022-02-01 LAB — BLOOD CULTURE ID PANEL (REFLEXED) - BCID2

## 2022-02-02 ENCOUNTER — Telehealth: Payer: Self-pay | Admitting: Emergency Medicine

## 2022-02-02 ENCOUNTER — Observation Stay
Admission: EM | Admit: 2022-02-02 | Discharge: 2022-02-03 | Disposition: A | Discharge status: Home / Self Care | Payer: Medicare HMO | Attending: Student | Admitting: Student

## 2022-02-02 ENCOUNTER — Other Ambulatory Visit: Payer: Self-pay

## 2022-02-02 DIAGNOSIS — R7881 Bacteremia: Principal | ICD-10-CM | POA: Insufficient documentation

## 2022-02-02 DIAGNOSIS — Z992 Dependence on renal dialysis: Secondary | ICD-10-CM | POA: Insufficient documentation

## 2022-02-02 DIAGNOSIS — Z7901 Long term (current) use of anticoagulants: Secondary | ICD-10-CM | POA: Insufficient documentation

## 2022-02-02 DIAGNOSIS — N390 Urinary tract infection, site not specified: Secondary | ICD-10-CM

## 2022-02-02 DIAGNOSIS — I480 Paroxysmal atrial fibrillation: Secondary | ICD-10-CM | POA: Insufficient documentation

## 2022-02-02 DIAGNOSIS — I2510 Atherosclerotic heart disease of native coronary artery without angina pectoris: Secondary | ICD-10-CM | POA: Diagnosis not present

## 2022-02-02 DIAGNOSIS — Z79899 Other long term (current) drug therapy: Secondary | ICD-10-CM | POA: Insufficient documentation

## 2022-02-02 DIAGNOSIS — Z8616 Personal history of COVID-19: Secondary | ICD-10-CM | POA: Insufficient documentation

## 2022-02-02 DIAGNOSIS — I120 Hypertensive chronic kidney disease with stage 5 chronic kidney disease or end stage renal disease: Secondary | ICD-10-CM | POA: Diagnosis not present

## 2022-02-02 DIAGNOSIS — E871 Hypo-osmolality and hyponatremia: Secondary | ICD-10-CM | POA: Diagnosis not present

## 2022-02-02 DIAGNOSIS — Z96643 Presence of artificial hip joint, bilateral: Secondary | ICD-10-CM | POA: Insufficient documentation

## 2022-02-02 DIAGNOSIS — Z96651 Presence of right artificial knee joint: Secondary | ICD-10-CM | POA: Insufficient documentation

## 2022-02-02 DIAGNOSIS — N186 End stage renal disease: Secondary | ICD-10-CM | POA: Diagnosis not present

## 2022-02-02 DIAGNOSIS — Z87891 Personal history of nicotine dependence: Secondary | ICD-10-CM | POA: Diagnosis not present

## 2022-02-02 DIAGNOSIS — B9620 Unspecified Escherichia coli [E. coli] as the cause of diseases classified elsewhere: Secondary | ICD-10-CM | POA: Diagnosis not present

## 2022-02-02 LAB — PROTIME-INR
INR: 1.7 — ABNORMAL HIGH (ref 0.8–1.2)
Prothrombin Time: 20.2 seconds — ABNORMAL HIGH (ref 11.4–15.2)

## 2022-02-02 LAB — CBC WITH DIFFERENTIAL/PLATELET
Abs Immature Granulocytes: 0.03 10*3/uL (ref 0.00–0.07)
Basophils Absolute: 0 10*3/uL (ref 0.0–0.1)
Basophils Relative: 0 %
Eosinophils Absolute: 0.1 10*3/uL (ref 0.0–0.5)
Eosinophils Relative: 1 %
HCT: 26.9 % — ABNORMAL LOW (ref 39.0–52.0)
Hemoglobin: 9.3 g/dL — ABNORMAL LOW (ref 13.0–17.0)
Immature Granulocytes: 1 %
Lymphocytes Relative: 5 %
Lymphs Abs: 0.3 10*3/uL — ABNORMAL LOW (ref 0.7–4.0)
MCH: 34.8 pg — ABNORMAL HIGH (ref 26.0–34.0)
MCHC: 34.6 g/dL (ref 30.0–36.0)
MCV: 100.7 fL — ABNORMAL HIGH (ref 80.0–100.0)
Monocytes Absolute: 0.8 10*3/uL (ref 0.1–1.0)
Monocytes Relative: 12 %
Neutro Abs: 5.1 10*3/uL (ref 1.7–7.7)
Neutrophils Relative %: 81 %
Platelets: 128 10*3/uL — ABNORMAL LOW (ref 150–400)
RBC: 2.67 MIL/uL — ABNORMAL LOW (ref 4.22–5.81)
RDW: 13.4 % (ref 11.5–15.5)
WBC: 6.3 10*3/uL (ref 4.0–10.5)
nRBC: 0 % (ref 0.0–0.2)

## 2022-02-02 LAB — BASIC METABOLIC PANEL
Anion gap: 18 — ABNORMAL HIGH (ref 5–15)
BUN: 75 mg/dL — ABNORMAL HIGH (ref 8–23)
CO2: 21 mmol/L — ABNORMAL LOW (ref 22–32)
Calcium: 8.6 mg/dL — ABNORMAL LOW (ref 8.9–10.3)
Chloride: 90 mmol/L — ABNORMAL LOW (ref 98–111)
Creatinine, Ser: 6.49 mg/dL — ABNORMAL HIGH (ref 0.61–1.24)
GFR, Estimated: 8 mL/min — ABNORMAL LOW (ref >60)
Glucose, Bld: 107 mg/dL — ABNORMAL HIGH (ref 70–99)
Potassium: 3.5 mmol/L (ref 3.5–5.1)
Sodium: 129 mmol/L — ABNORMAL LOW (ref 135–145)

## 2022-02-02 LAB — MRSA NEXT GEN BY PCR, NASAL: MRSA by PCR Next Gen: NOT DETECTED

## 2022-02-02 LAB — LACTIC ACID, PLASMA: Lactic Acid, Venous: 1.7 mmol/L (ref 0.5–1.9)

## 2022-02-02 LAB — APTT: aPTT: 36 seconds (ref 24–36)

## 2022-02-02 MED ORDER — SODIUM CHLORIDE 0.9 % IV SOLN: 1.0000 g | INTRAVENOUS | Status: DC

## 2022-02-02 MED ORDER — PENTAFLUOROPROP-TETRAFLUOROETH EX AERO: 1.0000 | INHALATION_SPRAY | CUTANEOUS | Status: DC | PRN

## 2022-02-02 MED ORDER — CHLORHEXIDINE GLUCONATE CLOTH 2 % EX PADS
6.0000 | MEDICATED_PAD | Freq: Every day | CUTANEOUS | Status: DC
  Administered 2022-02-03: 6 via TOPICAL

## 2022-02-02 MED ORDER — SODIUM CHLORIDE 0.9 % IV SOLN
2.0000 g | INTRAVENOUS | Status: DC
  Filled 2022-02-02: qty 20

## 2022-02-02 MED ORDER — ANTICOAGULANT SODIUM CITRATE 4% (200MG/5ML) IV SOLN: 5.0000 mL | Status: DC | PRN

## 2022-02-02 MED ORDER — LIDOCAINE HCL (PF) 1 % IJ SOLN: 5.0000 mL | INTRAMUSCULAR | Status: DC | PRN

## 2022-02-02 MED ORDER — LIDOCAINE-PRILOCAINE 2.5-2.5 % EX CREA: 1.0000 | TOPICAL_CREAM | CUTANEOUS | Status: DC | PRN

## 2022-02-02 MED ORDER — CARVEDILOL 6.25 MG PO TABS
3.1250 mg | ORAL_TABLET | Freq: Two times a day (BID) | ORAL | Status: DC
  Administered 2022-02-02: 3.125 mg via ORAL
  Filled 2022-02-02: qty 1

## 2022-02-02 MED ORDER — SENNOSIDES-DOCUSATE SODIUM 8.6-50 MG PO TABS: 1.0000 | ORAL_TABLET | Freq: Every day | ORAL | Status: DC | PRN

## 2022-02-02 MED ORDER — ROSUVASTATIN CALCIUM 10 MG PO TABS
10.0000 mg | ORAL_TABLET | Freq: Every day | ORAL | Status: DC
  Administered 2022-02-02: 10 mg via ORAL
  Filled 2022-02-02: qty 1

## 2022-02-02 MED ORDER — SODIUM CHLORIDE 0.9 % IV SOLN
1.0000 g | Freq: Once | INTRAVENOUS | Status: AC
  Administered 2022-02-02: 1 g via INTRAVENOUS
  Filled 2022-02-02: qty 1

## 2022-02-02 MED ORDER — TRAZODONE HCL 50 MG PO TABS: 50.0000 mg | ORAL_TABLET | Freq: Every evening | ORAL | Status: DC | PRN

## 2022-02-02 MED ORDER — TAMSULOSIN HCL 0.4 MG PO CAPS
0.4000 mg | ORAL_CAPSULE | Freq: Every day | ORAL | Status: DC
  Administered 2022-02-02: 0.4 mg via ORAL
  Filled 2022-02-02: qty 1

## 2022-02-02 MED ORDER — TORSEMIDE 20 MG PO TABS: 40.0000 mg | ORAL_TABLET | Freq: Every day | ORAL | Status: DC

## 2022-02-02 MED ORDER — SODIUM CHLORIDE 0.9 % IV SOLN
1.0000 g | Freq: Once | INTRAVENOUS | Status: AC
  Administered 2022-02-02: 1 g via INTRAVENOUS
  Filled 2022-02-02: qty 10

## 2022-02-02 MED ORDER — APIXABAN 2.5 MG PO TABS
2.5000 mg | ORAL_TABLET | Freq: Two times a day (BID) | ORAL | Status: DC
  Administered 2022-02-02: 2.5 mg via ORAL
  Filled 2022-02-02: qty 1

## 2022-02-02 MED ORDER — HEPARIN SODIUM (PORCINE) 1000 UNIT/ML DIALYSIS: 1000.0000 [IU] | INTRAMUSCULAR | Status: DC | PRN

## 2022-02-02 MED ORDER — HYDROCODONE-ACETAMINOPHEN 5-325 MG PO TABS: 2.0000 | ORAL_TABLET | Freq: Four times a day (QID) | ORAL | Status: DC | PRN

## 2022-02-02 MED ORDER — CALCITRIOL 0.25 MCG PO CAPS
0.2500 ug | ORAL_CAPSULE | Freq: Every day | ORAL | Status: DC
  Filled 2022-02-02: qty 1

## 2022-02-02 MED ORDER — ALTEPLASE 2 MG IJ SOLR: 2.0000 mg | Freq: Once | INTRAMUSCULAR | Status: DC | PRN

## 2022-02-02 NOTE — Progress Notes (Signed)
PHARMACY NOTE:  ANTIMICROBIAL DOSAGE ADJUSTMENT  As per policy approved by the Pharmacy & Therapeutics and Medical Executive Committees, the antimicrobial dosage will be adjusted accordingly for bacteremia indication.  Current antimicrobial dosage:   Ceftriaxone 1 gram IV q24h  Indication: Bacteremia     Antimicrobial dosage has been changed to:   Ceftriaxone 2 gm IV q24h  Additional comments:   Thank you for allowing pharmacy to be a part of this patient's care.  Erving Sassano A, [facility] 02/02/2022 2:47 PM

## 2022-02-02 NOTE — ED Triage Notes (Signed)
Pt to ED via POV from home. Pt was seen on 1/31 for weakness, fatigue and bleeding from penis. Pt was dx with UTI. Pt was called to come back in today due to blood culture being e coli +. Pt ambulatory to triage.

## 2022-02-02 NOTE — Progress Notes (Signed)
PHARMACY - PHYSICIAN COMMUNICATION CRITICAL VALUE ALERT - BLOOD CULTURE IDENTIFICATION (BCID)  Lawrence Perkins. is an 82 y.o. male who presented to [facility] on 01/31/2022 with a chief complaint of  weakness, fatigue, bleeding from penis  Assessment:  1/31 blood cultures with E coli,  suspect urinary source.  Patient discharged from ED 1/31 PM.   Name of physician (or Provider) Contacted: Dr Jacelyn Grip  Current antibiotics: Discharged on keflex '500mg'$  3x/week with HD x 4 doses.  Ceftriaxone 2gm IV x 1 given in ED  Changes to prescribed antibiotics recommended:  Called to come back to ED   Results for orders placed or performed during the hospital encounter of 01/31/22  Blood Culture ID Panel (Reflexed) (Collected: 01/31/2022  5:48 PM)  Result Value Ref Range   Enterococcus faecalis NOT DETECTED NOT DETECTED   Enterococcus Faecium NOT DETECTED NOT DETECTED   Listeria monocytogenes NOT DETECTED NOT DETECTED   Staphylococcus species NOT DETECTED NOT DETECTED   Staphylococcus aureus (BCID) NOT DETECTED NOT DETECTED   Staphylococcus epidermidis NOT DETECTED NOT DETECTED   Staphylococcus lugdunensis NOT DETECTED NOT DETECTED   Streptococcus species NOT DETECTED NOT DETECTED   Streptococcus agalactiae NOT DETECTED NOT DETECTED   Streptococcus pneumoniae NOT DETECTED NOT DETECTED   Streptococcus pyogenes NOT DETECTED NOT DETECTED   A.calcoaceticus-baumannii NOT DETECTED NOT DETECTED   Bacteroides fragilis NOT DETECTED NOT DETECTED   Enterobacterales DETECTED (A) NOT DETECTED   Enterobacter cloacae complex NOT DETECTED NOT DETECTED   Escherichia coli DETECTED (A) NOT DETECTED   Klebsiella aerogenes NOT DETECTED NOT DETECTED   Klebsiella oxytoca NOT DETECTED NOT DETECTED   Klebsiella pneumoniae NOT DETECTED NOT DETECTED   Proteus species NOT DETECTED NOT DETECTED   Salmonella species NOT DETECTED NOT DETECTED   Serratia marcescens NOT DETECTED NOT DETECTED   Haemophilus influenzae NOT  DETECTED NOT DETECTED   Neisseria meningitidis NOT DETECTED NOT DETECTED   Pseudomonas aeruginosa NOT DETECTED NOT DETECTED   Stenotrophomonas maltophilia NOT DETECTED NOT DETECTED   Candida albicans NOT DETECTED NOT DETECTED   Candida auris NOT DETECTED NOT DETECTED   Candida glabrata NOT DETECTED NOT DETECTED   Candida krusei NOT DETECTED NOT DETECTED   Candida parapsilosis NOT DETECTED NOT DETECTED   Candida tropicalis NOT DETECTED NOT DETECTED   Cryptococcus neoformans/gattii NOT DETECTED NOT DETECTED   CTX-M ESBL NOT DETECTED NOT DETECTED   Carbapenem resistance IMP NOT DETECTED NOT DETECTED   Carbapenem resistance KPC NOT DETECTED NOT DETECTED   Carbapenem resistance NDM NOT DETECTED NOT DETECTED   Carbapenem resist OXA 48 LIKE NOT DETECTED NOT DETECTED   Carbapenem resistance VIM NOT DETECTED NOT DETECTED    Doreene Eland, PharmD, BCPS, BCIDP Work Cell: [phone] 02/02/2022 8:35 AM

## 2022-02-02 NOTE — ED Triage Notes (Signed)
Pt called to return to ED for positive blood cultures. Pt denies pain.

## 2022-02-02 NOTE — H&P (Addendum)
History and Physical    Lawrence Perkins. CVE:[id number] DOB: Jan 03, 1940 DOA: 02/02/2022  PCP: Kirk Ruths, MD (Confirm with patient/family/NH records and if not entered, this has to be entered at [facility] point of entry) Patient coming from: Home  I have personally briefly reviewed patient's old medical records in [place]  Chief Complaint: Feeling ok  HPI: Lawrence Perkins. is a 82 y.o. 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  Patient developed hematuria and generalized weakness about 7 days ago, and 2 days ago he also developed fever 102 and came to ED.  ED workup showed patient had a microscopic hematuria suspicious for UTI and also concerned about urosepsis and patient was asked to be admitted however patient signed out himself.  On ED discharge patient was prescribed with 7 days course of Keflex renal dosed.  Last 2 days, patient reported improvement of hematuria, denies any urinary frequency or abdominal pain or back pain.  And no more fever.  This morning, 1/2 bottles grow E. coli and patient was called back to ED for further analysis.  In the ED, patient is afebrile, none tachycardia no hypotensive.  Patient was given 1 dose of IV ceftriaxone.   Review of Systems: As per HPI otherwise 14 point review of systems negative.    Past Medical History:  Diagnosis Date   Anemia    Aortic atherosclerosis (HCC)    Arthritis    Atrial fibrillation and flutter ([place])    a.) CHA2DS2-VASc = 5 (age x 2, CHF, HTN, aortic plaque). b.) rate/rhythm maintain on oral carvedilol; chronically anticoagulated with dose reduced apixaban   Bilateral inguinal hernia    BPH (benign prostatic hyperplasia)    Cardiac syncope    Cardiomyopathy ([place])    a.) TTE 04/23/2016: EF 40%. b.) TTE 09/03/2016: EF 50%. c.) TTE 05/04/2019: EF  >55%.   CHF (congestive heart failure) ([place])    a.) TTE 04/23/2016: EF 40%; BAE; mild AR/TR, mod MR; G1DD. b.) TTE 09/03/2016: EF 50%; mild LVH; BAE; triv AR/PR, mod MR/TR. c.) TTE 05/04/2019: EF >55%; mod LVH, mild LA enlargement; mild AR/MR/TR   CKD (chronic kidney disease), stage V (HCC)    Complication of anesthesia    a.) h/o intra/postoperative A.fib   Coronary artery disease    Diverticulosis    Gout    History of 2019 novel coronavirus disease (COVID-19) 11/14/2020   History of colon polyps    Hypercholesteremia    Hypertension    Long term current use of anticoagulant    a.) dose reduced apixaban; dose reduction d/t to age and CKD Dx.   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[place])    a.) s/p resection   MGUS (monoclonal gammopathy of unknown significance)    Pleural effusion    Rectal bleeding    Secondary hyperparathyroidism of renal origin [facility])     Past Surgical History:  Procedure Laterality Date   ANKLE SURGERY Right 1985   following volleyball injury   APPENDECTOMY     at the of 9 yrs   AV FISTULA PLACEMENT Left 01/04/2022   Procedure: ARTERIOVENOUS (AV) FISTULA CREATION;  Surgeon: Algernon Huxley, MD;  Location: [facility] ORS;  Service: Vascular;  Laterality: Left;   CAPD INSERTION N/A 08/10/2021   Procedure: LAPAROSCOPIC INSERTION CONTINUOUS AMBULATORY PERITONEAL DIALYSIS  (CAPD) CATHETER;  Surgeon: Caroleen Hamman  F, MD;  Location: [facility] ORS;  Service: General;  Laterality: N/A;   CAPD REVISION N/A 09/21/2021   Procedure: LAPAROSCOPIC REVISION CONTINUOUS AMBULATORY PERITONEAL DIALYSIS  (CAPD) CATHETER;  Surgeon: Jules Husbands, MD;  Location: [facility] ORS;  Service: General;  Laterality: N/A;   CATARACT EXTRACTION Left    COLONOSCOPY     COLONOSCOPY WITH PROPOFOL N/A 01/02/2017   Procedure: COLONOSCOPY WITH PROPOFOL;  Surgeon: Manya Silvas, MD;  Location: [facility] ENDOSCOPY;  Service: Endoscopy;  Laterality: N/A;   DIALYSIS/PERMA CATHETER INSERTION N/A 03/27/2021   Procedure: DIALYSIS/PERMA  CATHETER INSERTION;  Surgeon: Algernon Huxley, MD;  Location: [place] CV LAB;  Service: Cardiovascular;  Laterality: N/A;   DIALYSIS/PERMA CATHETER INSERTION N/A 10/10/2021   Procedure: DIALYSIS/PERMA CATHETER INSERTION;  Surgeon: Katha Cabal, MD;  Location: [place] CV LAB;  Service: Cardiovascular;  Laterality: N/A;   DIALYSIS/PERMA CATHETER REMOVAL N/A 05/08/2021   Procedure: DIALYSIS/PERMA CATHETER REMOVAL;  Surgeon: Algernon Huxley, MD;  Location: [place] CV LAB;  Service: Cardiovascular;  Laterality: N/A;   INSERTION OF MESH  08/10/2021   Procedure: INSERTION OF MESH;  Surgeon: Jules Husbands, MD;  Location: [facility] ORS;  Service: General;;   JOINT REPLACEMENT     RIGHT TOTAL HIP   KNEE ARTHROSCOPY WITH LATERAL RELEASE Right 04/20/2021   Procedure: Right knee medial retinaculum and lateral release with polyethylene exchange;  Surgeon: Hessie Knows, MD;  Location: [facility] ORS;  Service: Orthopedics;  Laterality: Right;   TONSILLECTOMY     as a child   TOTAL HIP ARTHROPLASTY  09/2009   Right femoral neck fracture   TOTAL HIP ARTHROPLASTY Left 03/06/2016   Procedure: TOTAL HIP ARTHROPLASTY ANTERIOR APPROACH;  Surgeon: Hessie Knows, MD;  Location: [facility] ORS;  Service: Orthopedics;  Laterality: Left;   TOTAL KNEE ARTHROPLASTY Right 03/21/2021   Procedure: TOTAL KNEE ARTHROPLASTY;  Surgeon: Hessie Knows, MD;  Location: [facility] ORS;  Service: Orthopedics;  Laterality: Right;   UMBILICAL HERNIA REPAIR  08/10/2021   Procedure: HERNIA REPAIR UMBILICAL ADULT;  Surgeon: Jules Husbands, MD;  Location: [facility] ORS;  Service: General;;     reports that he quit smoking about 41 years ago. His smoking use included cigarettes. He has never used smokeless tobacco. He reports that he does not currently use alcohol. He reports that he does not use drugs.  No Known Allergies  Family History  Problem Relation Age of Onset   Cirrhosis Mother    Other Father        Lung Fibrosis   Prostate cancer  Neg Hx    Kidney cancer Neg Hx    Bladder Cancer Neg Hx      Prior to Admission medications   Medication Sig Start Date End Date Taking? Authorizing Provider  apixaban (ELIQUIS) 2.5 MG TABS tablet Take 2.5 mg by mouth 2 (two) times daily.    [provider]  B Complex Vitamins (B-COMPLEX/B-12 PO) Take 1,000 mcg by mouth.    [provider]  calcitRIOL (ROCALTROL) 0.25 MCG capsule Take 0.25 mcg by mouth daily. 07/11/21   [provider]  carvedilol (COREG) 3.125 MG tablet Take 3.125 mg by mouth 2 (two) times daily with a meal.     [provider]  cholecalciferol (VITAMIN D3) 25 MCG (1000 UNIT) tablet Take 1,000 Units by mouth daily.    [provider]  HYDROcodone-acetaminophen (NORCO/VICODIN) 5-325 MG tablet Take 2 tablets by mouth every 6 (six) hours as needed for moderate pain. 01/04/22 01/04/23  Algernon Huxley, MD  Multiple Vitamin (MULTIVITAMIN WITH MINERALS) TABS tablet Take 1 tablet by mouth daily.    [provider]  rosuvastatin (CRESTOR) 10 MG tablet Take 10 mg by mouth at bedtime. 09/23/17   [provider]  Saccharomyces boulardii (PROBIOTIC) 250 MG CAPS Take 1 capsule by mouth in the morning and at bedtime. 01/31/22   Merlyn Lot, MD  senna-docusate (SENOKOT-S) 8.6-50 MG tablet Take 1 tablet by mouth daily as needed.    [provider]  tamsulosin (FLOMAX) 0.4 MG CAPS capsule Take 1 capsule (0.4 mg total) by mouth daily. 07/19/21   Zara Council A, PA-C  torsemide (DEMADEX) 20 MG tablet Take 2 tablets (40 mg total) by mouth daily. 02/18/18   Mayo, Pete Pelt, MD  traZODone (DESYREL) 50 MG tablet Take 50 mg by mouth at bedtime as needed for sleep. 10/03/21   [provider]    Physical Exam: Vitals:   02/02/22 0944 02/02/22 0945  BP:  139/80  Pulse:  76  Resp:  18  Temp:  (!) 97.5 F (36.4 C)  TempSrc:  Oral  SpO2:  100%  Weight: 83 kg   Height: '5\' 10"'$  (1.778 m)     Constitutional: NAD, calm,  comfortable Vitals:   02/02/22 0944 02/02/22 0945  BP:  139/80  Pulse:  76  Resp:  18  Temp:  (!) 97.5 F (36.4 C)  TempSrc:  Oral  SpO2:  100%  Weight: 83 kg   Height: '5\' 10"'$  (1.778 m)    Eyes: PERRL, lids and conjunctivae normal ENMT: Mucous membranes are moist. Posterior pharynx clear of any exudate or lesions.Normal dentition.  Neck: normal, supple, no masses, no thyromegaly Respiratory: clear to auscultation bilaterally, no wheezing, no crackles. Normal respiratory effort. No accessory muscle use.  Cardiovascular: Regular rate and rhythm, no murmurs / rubs / gallops. No extremity edema. 2+ pedal pulses. No carotid bruits.  Abdomen: no tenderness, no masses palpated. No hepatosplenomegaly. Bowel sounds positive.  Musculoskeletal: no clubbing / cyanosis. No joint deformity upper and lower extremities. Good ROM, no contractures. Normal muscle tone.  Skin: no rashes, lesions, ulcers. No induration. Right chest HD cath site clean, no signs of infection Neurologic: CN 2-12 grossly intact. Sensation intact, DTR normal. Strength 5/5 in all 4.  Psychiatric: Normal judgment and insight. Alert and oriented x 3. Normal mood.     Labs on Admission: I have personally reviewed following labs and imaging studies  CBC: Recent Labs  Lab 01/31/22 1723 02/02/22 1006  WBC 7.4 6.3  NEUTROABS 6.7 5.1  HGB 10.7* 9.3*  HCT 30.8* 26.9*  MCV 101.0* 100.7*  PLT 139* 448*   Basic Metabolic Panel: Recent Labs  Lab 01/31/22 1723 02/02/22 1006  NA 133* 129*  K 3.7 3.5  CL 92* 90*  CO2 26 21*  GLUCOSE 137* 107*  BUN 37* 75*  CREATININE 4.21* 6.49*  CALCIUM 8.5* 8.6*   GFR: Estimated Creatinine Clearance: 9.1 mL/min (A) (by C-G formula based on SCr of 6.49 mg/dL (H)). Liver Function Tests: Recent Labs  Lab 01/31/22 1723  AST 14*  ALT 12  ALKPHOS 77  BILITOT 1.4*  PROT 7.4  ALBUMIN 3.8   No results for input(s): "LIPASE", "AMYLASE" in the last 168 hours. No results for input(s):  "AMMONIA" in the last 168 hours. Coagulation Profile: Recent Labs  Lab 01/31/22 1723 02/02/22 1006  INR 1.9* 1.7*   Cardiac Enzymes: No results for input(s): "CKTOTAL", "CKMB", "CKMBINDEX", "TROPONINI" in the last  168 hours. BNP (last 3 results) No results for input(s): "PROBNP" in the last 8760 hours. HbA1C: No results for input(s): "HGBA1C" in the last 72 hours. CBG: No results for input(s): "GLUCAP" in the last 168 hours. Lipid Profile: No results for input(s): "CHOL", "HDL", "LDLCALC", "TRIG", "CHOLHDL", "LDLDIRECT" in the last 72 hours. Thyroid Function Tests: No results for input(s): "TSH", "T4TOTAL", "FREET4", "T3FREE", "THYROIDAB" in the last 72 hours. Anemia Panel: No results for input(s): "VITAMINB12", "FOLATE", "FERRITIN", "TIBC", "IRON", "RETICCTPCT" in the last 72 hours. Urine analysis:    Component Value Date/Time   COLORURINE YELLOW (A) 01/31/2022 1748   APPEARANCEUR TURBID (A) 01/31/2022 1748   APPEARANCEUR Clear 06/06/2021 1124   LABSPEC 1.014 01/31/2022 1748   LABSPEC 1.011 02/04/2014 1449   PHURINE 5.0 01/31/2022 1748   GLUCOSEU NEGATIVE 01/31/2022 1748   GLUCOSEU Negative 02/04/2014 1449   HGBUR MODERATE (A) 01/31/2022 1748   BILIRUBINUR NEGATIVE 01/31/2022 1748   BILIRUBINUR Negative 06/06/2021 1124   BILIRUBINUR Negative 02/04/2014 1449   KETONESUR NEGATIVE 01/31/2022 1748   PROTEINUR >=300 (A) 01/31/2022 1748   NITRITE NEGATIVE 01/31/2022 1748   LEUKOCYTESUR LARGE (A) 01/31/2022 1748   LEUKOCYTESUR Negative 02/04/2014 1449    Radiological Exams on Admission: CT HEAD WO CONTRAST (5MM)  Result Date: 01/31/2022 CLINICAL DATA:  Weakness and fatigue, initial encounter EXAM: CT HEAD WITHOUT CONTRAST TECHNIQUE: Contiguous axial images were obtained from the base of the skull through the vertex without intravenous contrast. RADIATION DOSE REDUCTION: This exam was performed according to the departmental dose-optimization program which includes automated  exposure control, adjustment of the mA and/or kV according to patient size and/or use of iterative reconstruction technique. COMPARISON:  01/05/2014 FINDINGS: Brain: No evidence of acute infarction, hemorrhage, hydrocephalus, extra-axial collection or mass lesion/mass effect. Mild atrophic changes and chronic white matter ischemic changes are seen. Vascular: No hyperdense vessel or unexpected calcification. Skull: Normal. Negative for fracture or focal lesion. Sinuses/Orbits: No acute finding. Other: None. IMPRESSION: Chronic atrophic and ischemic changes without acute abnormality. Electronically Signed   By: Inez Catalina M.D.   On: 01/31/2022 18:55   DG Chest Port 1 View  Result Date: 01/31/2022 CLINICAL DATA:  Weakness and fatigue for several days, initial encounter EXAM: PORTABLE CHEST 1 VIEW COMPARISON:  10/11/2021 FINDINGS: Cardiac shadow is within normal limits. The lungs are well aerated bilaterally. Nipple shadows are noted bilaterally. No focal infiltrate is seen. Old calcified granulomas are noted stable from the prior exam. No new focal abnormality is seen. Right dialysis catheter is noted in satisfactory position. IMPRESSION: No acute abnormality noted. Electronically Signed   By: Inez Catalina M.D.   On: 01/31/2022 18:00    EKG: Independently reviewed. Sinus,  no acute ST changes.  Assessment/Plan Principal Problem:   Bacteremia  (please populate well all problems here in Problem List. (For example, if patient is on BP meds at home and you resume or decide to hold them, it is a problem that needs to be her. Same for CAD, COPD, HLD and so on)  E. coli bacteremia -Likely secondary to UTI.  Unfortunately there is no urine culture from last ED visit 2 days ago.  Case was discussed with infection disease Dr. Juleen China, who recommended that given the patient has HD catheter recommend that the patient switched to IV antibiotics until culture clears and recommended inpatient to have his normal  Saturday HD session tomorrow and if blood culture sent today remains negative tomorrow, patient likely can go home with p.o. antibiotics and  to have HD exchanged before his next HD session on next Tuesday.  I explained to patient and his wife over the phone, both expressed understanding and agreed.   -Secure chat texted Dr. Maryclare Bean coli UTI -As above  Hyponatremia -Likely related to ESRD, fluid restriction -HD as per nephro  ESRD on HD -Euvolemic, nephro consulted for routine HD tomorrow.  HTN Stable, continue home meds  BPH -Stable, continue Flomax -Recommend outpatient urology follow up for urine dynamic study  Afib -In sinus rhythm, continue Eliquis  DVT prophylaxis: Eliquis Code Status: Full code Family Communication: Wife over phone Disposition Plan: Expect less than 2 midnight hospital stay Consults called: Curbside consult with ID and texted Dr. Holley Raring Admission status: Medsurg OBs   Lequita Halt MD [facility] Pager [phone]  02/02/2022, 12:31 PM

## 2022-02-02 NOTE — Telephone Encounter (Signed)
Called patient due to positive blood culture with recommendation to return to the ED.  Patient will return now.

## 2022-02-02 NOTE — ED Provider Notes (Signed)
[facility] Provider Note    Event Date/Time   First MD Initiated Contact with Patient 02/02/22 [phone]     (approximate)   History   UTI (+ blood culture for e coli )   HPI  Lawrence Perkins. is a 82 y.o. male   Past medical history of end-stage renal disease on hemodialysis Tuesday Thursday Saturday who returns to the emergency department after being diagnosed with urinary tract infection on 01/31/2022 due to E. coli positive blood culture.  He originally presented to the hospital emergency department on 01/31/2022 with fever and profound weakness as well as hematuria, he still makes minimal urine despite his end-stage renal disease.  He opted for outpatient management of his urinary tract infection at that time but pharmacist informed us of positive E. coli blood culture today so patient was called back to the emergency department.  He has been recovering well since his discharge and denies dysuria, fevers, chills.  His weakness has been gradually improving.  Dialysis session was Wednesday as they had to reconfigure his schedule to accommodate other medical appointments this week.  He denies any other medical complaints at this time.   External Medical Documents Reviewed: Emergency department visit dated 01/31/2022 for UTI      Physical Exam   Triage Vital Signs: ED Triage Vitals  Enc Vitals Group     BP 02/02/22 0945 139/80     Pulse Rate 02/02/22 0945 76     Resp 02/02/22 0945 18     Temp 02/02/22 0945 (!) 97.5 F (36.4 C)     Temp Source 02/02/22 0945 Oral     SpO2 02/02/22 0945 100 %     Weight 02/02/22 0944 182 lb 15.7 oz (83 kg)     Height 02/02/22 0944 '5\' 10"'$  (1.778 m)     Head Circumference --      Peak Flow --      Pain Score 02/02/22 0944 0     Pain Loc --      Pain Edu? --      Excl. in [place]? --     Most recent vital signs: Vitals:   02/02/22 0945  BP: 139/80  Pulse: 76  Resp: 18  Temp: (!) 97.5 F (36.4 C)  SpO2: 100%     General: Awake, no distress.  CV:  Good peripheral perfusion.  Resp:  Normal effort.  Abd:  No distention.  Other:  Hemodynamics appropriate and reassuring his temperature is slightly low at 97 5 Fahrenheit, abdomen is soft and nontender and he has awake alert nontoxic-appearing.   ED Results / Procedures / Treatments   Labs (all labs ordered are listed, but only abnormal results are displayed) Labs Reviewed  CBC WITH DIFFERENTIAL/PLATELET - Abnormal; Notable for the following components:      Result Value   RBC 2.67 (*)    Hemoglobin 9.3 (*)    HCT 26.9 (*)    MCV 100.7 (*)    MCH 34.8 (*)    Platelets 128 (*)    Lymphs Abs 0.3 (*)    All other components within normal limits  URINE CULTURE  CULTURE, BLOOD (ROUTINE X 2)  CULTURE, BLOOD (ROUTINE X 2)  LACTIC ACID, PLASMA  BASIC METABOLIC PANEL  LACTIC ACID, PLASMA  PROTIME-INR  APTT     I ordered and reviewed the above labs they are notable for globin is at baseline 9.3 with a normal white blood cell count  PROCEDURES:  Critical Care performed: No  Procedures   MEDICATIONS ORDERED IN ED: Medications  cefTRIAXone (ROCEPHIN) 1 g in sodium chloride 0.9 % 100 mL IVPB (1 g Intravenous New Bag/Given 02/02/22 1024)    External physician / consultants:  I spoke with hospitalist for admission & regarding care plan for this patient.   IMPRESSION / MDM / ASSESSMENT AND PLAN / ED COURSE  I reviewed the triage vital signs and the nursing notes.                                Patient's presentation is most consistent with acute presentation with potential threat to life or bodily function.  Differential diagnosis includes, but is not limited to, urinary tract infection, E. coli bacteremia, sepsis   The patient is on the cardiac monitor to evaluate for evidence of arrhythmia and/or significant heart rate changes.  MDM: This is a patient with urinary tract infection diagnosed several days ago and was started on  Keflex with some improvement in his symptoms but blood cultures resulted with E. coli and so the patient was called back.  He was recovering well at home but eventually presented with fever and profound weakness.  He has multiple comorbidities including end-stage renal disease on dialysis as well as atrial fibrillation on anticoagulation Eliquis, hypertension, hyperlipidemia, cardiomyopathy.  Our pharmacist also informed her that outpatient urinary tract infection antibiotics is complicated by his hemodialysis sessions and that they have previously prescribed regimen may not be sufficient for his E. coli bacteremia.  Pharmacist also informed that IV Rocephin should provide adequate coverage for E. coli bacteremia and so I initiated another dose of IV Rocephin upon his presentation to the emergency department.  I will admit him.         FINAL CLINICAL IMPRESSION(S) / ED DIAGNOSES   Final diagnoses:  Urinary tract infection without hematuria, site unspecified  E coli bacteremia     Rx / DC Orders   ED Discharge Orders     None        Note:  This document was prepared using Dragon voice recognition software and may include unintentional dictation errors.    Lucillie Garfinkel, MD 02/02/22 [phone]

## 2022-02-03 DIAGNOSIS — D631 Anemia in chronic kidney disease: Secondary | ICD-10-CM | POA: Diagnosis not present

## 2022-02-03 DIAGNOSIS — R7881 Bacteremia: Secondary | ICD-10-CM | POA: Diagnosis not present

## 2022-02-03 DIAGNOSIS — N186 End stage renal disease: Secondary | ICD-10-CM | POA: Diagnosis not present

## 2022-02-03 DIAGNOSIS — N390 Urinary tract infection, site not specified: Secondary | ICD-10-CM | POA: Diagnosis not present

## 2022-02-03 LAB — IRON AND TIBC
Iron: 37 ug/dL — ABNORMAL LOW (ref 45–182)
Saturation Ratios: 18 % (ref 17.9–39.5)
TIBC: 210 ug/dL — ABNORMAL LOW (ref 250–450)
UIBC: 173 ug/dL

## 2022-02-03 LAB — MAGNESIUM: Magnesium: 2.2 mg/dL (ref 1.7–2.4)

## 2022-02-03 LAB — URINE CULTURE: Culture: NO GROWTH

## 2022-02-03 LAB — CBC
HCT: 25.8 % — ABNORMAL LOW (ref 39.0–52.0)
Hemoglobin: 9 g/dL — ABNORMAL LOW (ref 13.0–17.0)
MCH: 34.5 pg — ABNORMAL HIGH (ref 26.0–34.0)
MCHC: 34.9 g/dL (ref 30.0–36.0)
MCV: 98.9 fL (ref 80.0–100.0)
Platelets: 132 10*3/uL — ABNORMAL LOW (ref 150–400)
RBC: 2.61 MIL/uL — ABNORMAL LOW (ref 4.22–5.81)
RDW: 13.4 % (ref 11.5–15.5)
WBC: 6.1 10*3/uL (ref 4.0–10.5)
nRBC: 0 % (ref 0.0–0.2)

## 2022-02-03 LAB — BASIC METABOLIC PANEL
Anion gap: 15 (ref 5–15)
BUN: 91 mg/dL — ABNORMAL HIGH (ref 8–23)
CO2: 25 mmol/L (ref 22–32)
Calcium: 8.3 mg/dL — ABNORMAL LOW (ref 8.9–10.3)
Chloride: 91 mmol/L — ABNORMAL LOW (ref 98–111)
Creatinine, Ser: 6.64 mg/dL — ABNORMAL HIGH (ref 0.61–1.24)
GFR, Estimated: 8 mL/min — ABNORMAL LOW (ref >60)
Glucose, Bld: 95 mg/dL (ref 70–99)
Potassium: 3.1 mmol/L — ABNORMAL LOW (ref 3.5–5.1)
Sodium: 131 mmol/L — ABNORMAL LOW (ref 135–145)

## 2022-02-03 LAB — PHOSPHORUS: Phosphorus: 5.1 mg/dL — ABNORMAL HIGH (ref 2.5–4.6)

## 2022-02-03 LAB — FOLATE: Folate: >40 ng/mL (ref >5.9)

## 2022-02-03 LAB — VITAMIN B12: Vitamin B-12: 7118 pg/mL — ABNORMAL HIGH (ref 180–914)

## 2022-02-03 LAB — HEPATITIS B SURFACE ANTIGEN: Hepatitis B Surface Ag: NONREACTIVE

## 2022-02-03 MED ORDER — POTASSIUM CHLORIDE CRYS ER 20 MEQ PO TBCR: 40.0000 meq | EXTENDED_RELEASE_TABLET | Freq: Once | ORAL | Status: DC

## 2022-02-03 MED ORDER — HEPARIN SODIUM (PORCINE) 1000 UNIT/ML IJ SOLN
INTRAMUSCULAR | Status: AC
  Filled 2022-02-03: qty 10

## 2022-02-03 NOTE — Progress Notes (Signed)
Pt completed 3.5 hrs of HD, tolerated well with no signs of distress  UF = 2000 ml    02/03/22 1316  Vitals  Temp 98.3 F (36.8 C)  Temp Source Oral  BP 121/60  MAP (mmHg) 78  BP Location Right Arm  BP Method Automatic  Patient Position (if appropriate) Lying  Pulse Rate 63  Pulse Rate Source Monitor  ECG Heart Rate 61  Resp (!) 22  Oxygen Therapy  SpO2 100 %  O2 Device Room Air  Patient Activity (if Appropriate) In bed  Pulse Oximetry Type Continuous  Post Treatment  Dialyzer Clearance Lightly streaked  Duration of HD Treatment -hour(s) 3.5 hour(s)  Hemodialysis Intake (mL) 0 mL  Liters Processed 83.4  Fluid Removed (mL) 2000 mL  Tolerated HD Treatment Yes  Post-Hemodialysis Comments No signs of distress  Note  Observations No issues  Hemodialysis Catheter Right Internal jugular Double lumen Permanent (Tunneled)  Placement Date/Time: 10/10/21 1735   Serial / Lot #: [id number]  Expiration Date: 12/11/25  Time Out: Correct patient;Correct site;Correct procedure  Maximum sterile barrier precautions: Hand hygiene;Cap;Mask;Sterile gown;Sterile gloves;Large sterile ...  Site Condition No complications  Blue Lumen Status Heparin locked  Red Lumen Status Heparin locked  Purple Lumen Status N/A  Catheter fill solution Heparin 1000 units/ml  Catheter fill volume (Arterial) 1.9 cc  Catheter fill volume (Venous) 1.9  Dressing Type Transparent  Dressing Status Antimicrobial disc in place;Clean, Dry, Intact  Drainage Description None  Dressing Change Due 02/10/22  Post treatment catheter status Capped and Clamped

## 2022-02-03 NOTE — Progress Notes (Signed)
[facility]  ROUNDING NOTE   Subjective:   Lawrence Perkins. is a 82 year old male with past medical conditions including atrial fibrillation on Eliquis, anemia, systolic CHF, diverticulitis, hypertension, arthritis, BPH, and end-stage renal disease on peritoneal dialysis. Patient presents to the emergency department with weakness, fatigue and fever and has been admitted under observation for Bacteremia [R78.81] E coli bacteremia [R78.81, B96.20] Urinary tract infection without hematuria, site unspecified [N39.0]  Patient is known to our practice and receives outpatient dialysis treatments at [facility] on a TTS schedule.  Last treatment received on Thursday.  Patient states he began having weakness with some hematuria about a week ago.  Patient states he has had intermittent fevers during that same time.  Patient denies missing any recent dialysis treatments.  Endorses poor oral intake.  Denies chest or abdominal pain.  Remains on room air.  Labs on ED arrival include sodium 129, CO2 21, BUN 75, creatinine 6.49 and GFR 8, Hgb 9.3.  Urine appears turbid with hematuria, proteinuria, leukocytes, and bacteria.    We have been consulted to manage dialysis needs during this admission.   Objective:  Vital signs in last 24 hours:  Temp:  [97.6 F (36.4 C)-98.7 F (37.1 C)] 98.3 F (36.8 C) (02/03 1316) Pulse Rate:  [60-87] 63 (02/03 1316) Resp:  [16-27] 22 (02/03 1316) BP: (105-129)/(51-74) 121/60 (02/03 1316) SpO2:  [93 %-100 %] 100 % (02/03 1316) Weight:  [78.1 kg-80.1 kg] 78.1 kg (02/03 1320)  Weight change:  Filed Weights   02/02/22 0944 02/03/22 0906 02/03/22 1320  Weight: 83 kg 80.1 kg 78.1 kg    Intake/Output: I/O last 3 completed shifts: In: 220 [P.O.:120; IV Piggyback:100] Out: -    Intake/Output this shift:  Total I/O In: -  Out: 2025 [Urine:25; Other:2000]  Physical Exam: General: NAD  Head: Normocephalic, atraumatic. Moist oral mucosal  membranes  Eyes: Anicteric  Lungs:  Clear to auscultation, normal effort  Heart: Regular rate and rhythm  Abdomen:  Soft, nontender  Extremities: No peripheral edema.  Neurologic: Alert and oriented, moving all four extremities  Skin: No lesions  Access: Right chest PermCath    Basic Metabolic Panel: Recent Labs  Lab 01/31/22 1723 02/02/22 1006 02/03/22 0459  NA 133* 129* 131*  K 3.7 3.5 3.1*  CL 92* 90* 91*  CO2 26 21* 25  GLUCOSE 137* 107* 95  BUN 37* 75* 91*  CREATININE 4.21* 6.49* 6.64*  CALCIUM 8.5* 8.6* 8.3*  MG  --   --  2.2  PHOS  --   --  5.1*    Liver Function Tests: Recent Labs  Lab 01/31/22 1723  AST 14*  ALT 12  ALKPHOS 77  BILITOT 1.4*  PROT 7.4  ALBUMIN 3.8   No results for input(s): "LIPASE", "AMYLASE" in the last 168 hours. No results for input(s): "AMMONIA" in the last 168 hours.  CBC: Recent Labs  Lab 01/31/22 1723 02/02/22 1006 02/03/22 0929  WBC 7.4 6.3 6.1  NEUTROABS 6.7 5.1  --   HGB 10.7* 9.3* 9.0*  HCT 30.8* 26.9* 25.8*  MCV 101.0* 100.7* 98.9  PLT 139* 128* 132*    Cardiac Enzymes: No results for input(s): "CKTOTAL", "CKMB", "CKMBINDEX", "TROPONINI" in the last 168 hours.  BNP: Invalid input(s): "POCBNP"  CBG: No results for input(s): "GLUCAP" in the last 168 hours.  Microbiology: Results for orders placed or performed during the hospital encounter of 02/02/22  Culture, blood (routine x 2)     Status: None (  Preliminary result)   Collection Time: 02/02/22 10:06 AM   Specimen: BLOOD RIGHT ARM  Result Value Ref Range Status   Specimen Description BLOOD RIGHT ARM  Final   Special Requests   Final    BOTTLES DRAWN AEROBIC AND ANAEROBIC Blood Culture adequate volume   Culture   Final    NO GROWTH < 24 HOURS Performed at [facility], [address], [place], [place] [address]    Report Status PENDING  Incomplete  Culture, blood (routine x 2)     Status: None (Preliminary result)   Collection Time: 02/02/22  10:06 AM   Specimen: Right Antecubital; Blood  Result Value Ref Range Status   Specimen Description RIGHT ANTECUBITAL  Final   Special Requests   Final    BOTTLES DRAWN AEROBIC AND ANAEROBIC Blood Culture adequate volume   Culture   Final    NO GROWTH < 24 HOURS Performed at [facility], [address], [place], [place] [address]    Report Status PENDING  Incomplete  Urine Culture (for pregnant, neutropenic or urologic patients or patients with an indwelling urinary catheter)     Status: None   Collection Time: 02/02/22 11:46 AM   Specimen: Urine, Clean Catch  Result Value Ref Range Status   Specimen Description   Final    URINE, CLEAN CATCH Performed at [facility], [address] [place], [place] [address]    Special Requests   Final    NONE Performed at [facility], [address], [place], [place] [address]    Culture   Final    NO GROWTH Performed at [facility], [place] [address] [place], [place] [address]    Report Status 02/03/2022 FINAL  Final  MRSA Next Gen by PCR, Nasal     Status: None   Collection Time: 02/02/22  8:45 PM   Specimen: Nasal Mucosa; Nasal Swab  Result Value Ref Range Status   MRSA by PCR Next Gen NOT DETECTED NOT DETECTED Final    Comment: (NOTE) The GeneXpert MRSA Assay (FDA approved for NASAL specimens only), is one component of a comprehensive MRSA colonization surveillance program. It is not intended to diagnose MRSA infection nor to guide or monitor treatment for MRSA infections. Test performance is not FDA approved in patients less than 59 years old. Performed at [facility], [address] [place], [place] [address]     Coagulation Studies: Recent Labs    01/31/22 1723 02/02/22 1006  LABPROT 21.5* 20.2*  INR 1.9* 1.7*    Urinalysis: Recent Labs    01/31/22 1748  COLORURINE YELLOW*  LABSPEC 1.014  PHURINE 5.0  GLUCOSEU NEGATIVE  HGBUR MODERATE*  BILIRUBINUR NEGATIVE   KETONESUR NEGATIVE  PROTEINUR >=300*  NITRITE NEGATIVE  LEUKOCYTESUR LARGE*      Imaging: No results found.   Medications:    anticoagulant sodium citrate     cefTRIAXone (ROCEPHIN)  IV      apixaban  2.5 mg Oral BID   calcitRIOL  0.25 mcg Oral Daily   carvedilol  3.125 mg Oral BID WC   Chlorhexidine Gluconate Cloth  6 each Topical Q0600   heparin sodium (porcine)       potassium chloride  40 mEq Oral Once   rosuvastatin  10 mg Oral QHS   tamsulosin  0.4 mg Oral Daily   torsemide  40 mg Oral Daily   alteplase, anticoagulant sodium citrate, heparin, heparin sodium (porcine), HYDROcodone-acetaminophen, lidocaine (PF), lidocaine-prilocaine,  pentafluoroprop-tetrafluoroeth, senna-docusate, traZODone  Assessment/ Plan:  Mr. Clerance Umland. is a 82 y.o.  male with past medical conditions including atrial fibrillation on Eliquis, anemia, systolic CHF, diverticulitis, hypertension, arthritis, BPH, and end-stage renal disease on peritoneal dialysis. Patient presents to the emergency department with weakness, fatigue and fever and has been admitted under observation for Bacteremia [R78.81] E coli bacteremia [R78.81, B96.20] Urinary tract infection without hematuria, site unspecified [N39.0]   Urinary tract infection, suspected E Coli bacteremia. UA shows large number of leukocytes and bacteria. Ordered Ceftriaxone. Will discharge on Ceftazidime 1g/1g/1g with dialysis for 6 doses.   2.  End-stage renal disease on hemodialysis.  Patient received treatment today, UF goal 2 L as tolerated.  Next treatment scheduled for Tuesday.  Outpatient dialysis clinic has been notified of antibiotic schedule for dialysis.  3. Anemia of chronic kidney disease Lab Results  Component Value Date   HGB 9.0 (L) 02/03/2022    Patient receives [facility] outpatient.  Hemoglobin currently within desired range.  4.  Hypertension with chronic kidney disease.  Currently receiving carvedilol and  torsemide.   LOS: 0 Fredda Clarida 2/3/20241:28 PM

## 2022-02-03 NOTE — Discharge Summary (Signed)
[facility] Discharge Summary   Patient: Lawrence Perkins. ZOX:[id number]  PCP: Kirk Ruths, MD  Date of admission: 02/02/2022   Date of discharge:  02/03/2022     Discharge Diagnoses:  Principal Problem: E. coli bacteremia   Admitted From: Home Disposition:  Home   Recommendations for Outpatient Follow-up:  Follow-up with PCP in 1 week Follow-up with nephrologist, continue hemodialysis TTS schedule and continue antibiotics during hemodialysis for 2 weeks.  (ceftazidime 1 gm iv each dialysis x 6 doses) Follow up LABS/TEST:     Diet recommendation: Renal diet  Activity: The patient is advised to gradually reintroduce usual activities, as tolerated  Discharge Condition: stable  Code Status: Full code   History of present illness: As per the H and P dictated on admission  Hospital Course:  Lawrence Perkins. is a 82 y.o. 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  Patient developed hematuria and generalized weakness about 7 days ago, and 2 days ago he also developed fever 102 and came to ED.  ED workup showed patient had a microscopic hematuria suspicious for UTI and also concerned about urosepsis and patient was asked to be admitted however patient signed out himself.  On ED discharge patient was prescribed with 7 days course of Keflex renal dosed.  Last 2 days, patient reported improvement of hematuria, denies any urinary frequency or abdominal pain or back pain. And no more fever.  This morning, 1/2 bottles grow E. coli and patient was called back to ED for further analysis.  In the ED, patient is afebrile, none tachycardia no hypotensive.  Patient was given 1 dose of IV ceftriaxone.  Assessment/Plan  # E. coli bacteremia, most likely source is UTI.  Blood culture positive so patient was called back for IV treatment.  Patient was on IV  ceftriaxone during hospital stay.  Repeat blood culture NGTD.  Discussed with nephrology, recommended ceftazidime 1 g IV each dialysis session for 6 doses.  Nephrology will take care of IV antibiotic during hemodialysis and follow blood cultures.  Patient was cleared by nephrology to discharge home to be followed up as an outpatient.  Patient agreed with the discharge planning. # E. coli UTI, continue IV antibiotics as above. # Hyponatremia, Likely related to ESRD, fluid restriction. HD as per nephro # ESRD on HD, Euvolemic, nephro consulted, patient received hemodialysis before discharge. # HTN, Stable, continue home meds # BPH, Stable, continue Flomax, Recommend outpatient urology follow up for urine dynamic study # Paroxysmal Afib, In sinus rhythm, continue Eliquis  Body mass index is 25.34 kg/m.  Nutrition Interventions:  Patient was ambulatory without any assistance. On the day of the discharge the patient's vitals were stable, and no other acute medical condition were reported by patient. the patient was felt safe to be discharge at Home.  Consultants: Nephrology Procedures: Hemodialysis  Discharge Exam: General: Appear in no distress, no Rash; Oral Mucosa Clear, moist. Cardiovascular: S1 and S2 Present, no Murmur, Respiratory: normal respiratory effort, Bilateral Air entry present and no Crackles, no wheezes Abdomen: Bowel Sound present, Soft and no tenderness, no hernia Extremities: no Pedal edema, no calf tenderness Neurology: alert and oriented to time, place, and person affect appropriate.  Filed Weights   02/02/22 0944 02/03/22 0906  Weight: 83 kg 80.1 kg   Vitals:   02/03/22 1000 02/03/22 1030  BP:  119/64   Pulse: 71 78  Resp: 17 18  Temp:    SpO2: 99% 99%    DISCHARGE MEDICATION: Allergies as of 02/03/2022   No Known Allergies      Medication List     STOP taking these medications    HYDROcodone-acetaminophen 5-325 MG tablet Commonly known as:  NORCO/VICODIN       TAKE these medications    apixaban 2.5 MG Tabs tablet Commonly known as: ELIQUIS Take 2.5 mg by mouth 2 (two) times daily.   B-COMPLEX/B-12 PO Take 1,000 mcg by mouth.   calcitRIOL 0.25 MCG capsule Commonly known as: ROCALTROL Take 0.25 mcg by mouth daily.   carvedilol 3.125 MG tablet Commonly known as: COREG Take 3.125 mg by mouth 2 (two) times daily with a meal.   cholecalciferol 25 MCG (1000 UNIT) tablet Commonly known as: VITAMIN D3 Take 1,000 Units by mouth daily.   cyanocobalamin 1000 MCG tablet Commonly known as: VITAMIN B12 Take 2,500 mcg by mouth daily.   multivitamin with minerals Tabs tablet Take 1 tablet by mouth daily.   Probiotic 250 MG Caps Take 1 capsule by mouth in the morning and at bedtime.   rosuvastatin 10 MG tablet Commonly known as: CRESTOR Take 10 mg by mouth at bedtime.   senna-docusate 8.6-50 MG tablet Commonly known as: Senokot-S Take 1 tablet by mouth daily as needed.   tamsulosin 0.4 MG Caps capsule Commonly known as: FLOMAX Take 1 capsule (0.4 mg total) by mouth daily.   torsemide 20 MG tablet Commonly known as: DEMADEX Take 2 tablets (40 mg total) by mouth daily.   traZODone 50 MG tablet Commonly known as: DESYREL Take 50 mg by mouth at bedtime as needed for sleep.       No Known Allergies Discharge Instructions     Call MD for:  difficulty breathing, headache or visual disturbances   Complete by: As directed    Call MD for:  extreme fatigue   Complete by: As directed    Call MD for:  persistant dizziness or light-headedness   Complete by: As directed    Call MD for:  persistant nausea and vomiting   Complete by: As directed    Call MD for:  severe uncontrolled pain   Complete by: As directed    Call MD for:  temperature >100.4   Complete by: As directed    Diet - low sodium heart healthy   Complete by: As directed    Discharge instructions   Complete by: As directed    Follow-up with PCP  in 1 week Follow-up with nephrologist, continue hemodialysis TTS schedule and continue antibiotics during hemodialysis for 2 weeks.  (ceftazidime 1 gm iv each dialysis x 6 doses)   Increase activity slowly   Complete by: As directed    No wound care   Complete by: As directed        The results of significant diagnostics from this hospitalization (including imaging, microbiology, ancillary and laboratory) are listed below for reference.    Significant Diagnostic Studies: CT HEAD WO CONTRAST (5MM)  Result Date: 01/31/2022 CLINICAL DATA:  Weakness and fatigue, initial encounter EXAM: CT HEAD WITHOUT CONTRAST TECHNIQUE: Contiguous axial images were obtained from the base of the skull through the vertex without intravenous contrast. RADIATION DOSE REDUCTION: This exam was performed according to the departmental dose-optimization program which includes automated exposure control, adjustment of the mA and/or kV according to patient size and/or use of iterative reconstruction technique. COMPARISON:  01/05/2014 FINDINGS: Brain: No evidence of acute infarction, hemorrhage, hydrocephalus, extra-axial collection or mass lesion/mass effect. Mild atrophic changes and chronic white matter ischemic changes are seen. Vascular: No hyperdense vessel or unexpected calcification. Skull: Normal. Negative for fracture or focal lesion. Sinuses/Orbits: No acute finding. Other: None. IMPRESSION: Chronic atrophic and ischemic changes without acute abnormality. Electronically Signed   By: Inez Catalina M.D.   On: 01/31/2022 18:55   DG Chest Port 1 View  Result Date: 01/31/2022 CLINICAL DATA:  Weakness and fatigue for several days, initial encounter EXAM: PORTABLE CHEST 1 VIEW COMPARISON:  10/11/2021 FINDINGS: Cardiac shadow is within normal limits. The lungs are well aerated bilaterally. Nipple shadows are noted bilaterally. No focal infiltrate is seen. Old calcified granulomas are noted stable from the prior exam. No new  focal abnormality is seen. Right dialysis catheter is noted in satisfactory position. IMPRESSION: No acute abnormality noted. Electronically Signed   By: Inez Catalina M.D.   On: 01/31/2022 18:00    Microbiology: Recent Results (from the past 240 hour(s))  Culture, blood (Routine x 2)     Status: None (Preliminary result)   Collection Time: 01/31/22  5:48 PM   Specimen: BLOOD  Result Value Ref Range Status   Specimen Description BLOOD BLOOD RIGHT WRIST  Final   Special Requests   Final    BOTTLES DRAWN AEROBIC AND ANAEROBIC Blood Culture results may not be optimal due to an excessive volume of blood received in culture bottles   Culture   Final    NO GROWTH 3 DAYS Performed at [facility], [address], [place], [place] [address]    Report Status PENDING  Incomplete  Culture, blood (Routine x 2)     Status: Abnormal (Preliminary result)   Collection Time: 01/31/22  5:48 PM   Specimen: BLOOD  Result Value Ref Range Status   Specimen Description   Final    BLOOD BLOOD RIGHT FOREARM Performed at [facility], [address], [place], [place] [address]    Special Requests   Final    BOTTLES DRAWN AEROBIC AND ANAEROBIC Blood Culture results may not be optimal due to an excessive volume of blood received in culture bottles Performed at [facility], [address] [place], [place] [address]    Culture  Setup Time   Final    GRAM NEGATIVE RODS GRAM POSITIVE RODS AEROBIC BOTTLE ONLY CRITICAL RESULT CALLED TO, READ BACK BY AND VERIFIED WITH: STEVEN JONES '@2350'$  ON 02/01/22 SKL    Culture (A)  Final    ESCHERICHIA COLI SUSCEPTIBILITIES TO FOLLOW Performed at [facility], [place] [address], [place],  [address]    Report Status PENDING  Incomplete  Resp panel by RT-PCR (RSV, Flu A&B, Covid) Anterior Nasal Swab     Status: None   Collection Time: 01/31/22  5:48 PM   Specimen: Anterior Nasal Swab  Result Value Ref Range Status   SARS Coronavirus  2 by RT PCR NEGATIVE NEGATIVE Final    Comment: (NOTE) SARS-CoV-2 target nucleic acids are NOT DETECTED.  The SARS-CoV-2 RNA is generally detectable in upper respiratory specimens during the acute phase of infection. The lowest concentration of SARS-CoV-2 viral copies this assay can detect is 138 copies/mL. A negative result does not preclude SARS-Cov-2 infection and should not be used as the sole basis for treatment or other patient management decisions. A negative result may occur with  improper specimen collection/handling, submission of specimen other than nasopharyngeal swab, presence  of viral mutation(s) within the areas targeted by this assay, and inadequate number of viral copies(<138 copies/mL). A negative result must be combined with clinical observations, patient history, and epidemiological information. The expected result is Negative.  Fact Sheet for Patients:  EntrepreneurPulse.com.au  Fact Sheet for Healthcare Providers:  IncredibleEmployment.be  This test is no t yet approved or cleared by the [place] FDA and  has been authorized for detection and/or diagnosis of SARS-CoV-2 by FDA under an Emergency Use Authorization (EUA). This EUA will remain  in effect (meaning this test can be used) for the duration of the COVID-19 declaration under Section 564(b)(1) of the Act, 21 U.S.C.section 360bbb-3(b)(1), unless the authorization is terminated  or revoked sooner.       Influenza A by PCR NEGATIVE NEGATIVE Final   Influenza B by PCR NEGATIVE NEGATIVE Final    Comment: (NOTE) The Xpert Xpress SARS-CoV-2/FLU/RSV plus assay is intended as an aid in the diagnosis of influenza from Nasopharyngeal swab specimens and should not be used as a sole basis for treatment. Nasal washings and aspirates are unacceptable for Xpert Xpress SARS-CoV-2/FLU/RSV testing.  Fact Sheet for Patients: EntrepreneurPulse.com.au  Fact  Sheet for Healthcare Providers: IncredibleEmployment.be  This test is not yet approved or cleared by the [place] FDA and has been authorized for detection and/or diagnosis of SARS-CoV-2 by FDA under an Emergency Use Authorization (EUA). This EUA will remain in effect (meaning this test can be used) for the duration of the COVID-19 declaration under Section 564(b)(1) of the Act, 21 U.S.C. section 360bbb-3(b)(1), unless the authorization is terminated or revoked.     Resp Syncytial Virus by PCR NEGATIVE NEGATIVE Final    Comment: (NOTE) Fact Sheet for Patients: EntrepreneurPulse.com.au  Fact Sheet for Healthcare Providers: IncredibleEmployment.be  This test is not yet approved or cleared by the [place] FDA and has been authorized for detection and/or diagnosis of SARS-CoV-2 by FDA under an Emergency Use Authorization (EUA). This EUA will remain in effect (meaning this test can be used) for the duration of the COVID-19 declaration under Section 564(b)(1) of the Act, 21 U.S.C. section 360bbb-3(b)(1), unless the authorization is terminated or revoked.  Performed at [facility], [address] [place], [place] [address]   Blood Culture ID Panel (Reflexed)     Status: Abnormal   Collection Time: 01/31/22  5:48 PM  Result Value Ref Range Status   Enterococcus faecalis NOT DETECTED NOT DETECTED Final   Enterococcus Faecium NOT DETECTED NOT DETECTED Final   Listeria monocytogenes NOT DETECTED NOT DETECTED Final   Staphylococcus species NOT DETECTED NOT DETECTED Final   Staphylococcus aureus (BCID) NOT DETECTED NOT DETECTED Final   Staphylococcus epidermidis NOT DETECTED NOT DETECTED Final   Staphylococcus lugdunensis NOT DETECTED NOT DETECTED Final   Streptococcus species NOT DETECTED NOT DETECTED Final   Streptococcus agalactiae NOT DETECTED NOT DETECTED Final   Streptococcus pneumoniae NOT DETECTED NOT  DETECTED Final   Streptococcus pyogenes NOT DETECTED NOT DETECTED Final   A.calcoaceticus-baumannii NOT DETECTED NOT DETECTED Final   Bacteroides fragilis NOT DETECTED NOT DETECTED Final   Enterobacterales DETECTED (A) NOT DETECTED Final    Comment: Enterobacterales represent a large order of gram negative bacteria, not a single organism. CRITICAL RESULT CALLED TO, READ BACK BY AND VERIFIED WITH: STEVEN JONES '@2350'$  ON 02/01/22 SKL    Enterobacter cloacae complex NOT DETECTED NOT DETECTED Final   Escherichia coli DETECTED (A) NOT DETECTED Final    Comment: CRITICAL RESULT CALLED TO, READ BACK  BY AND VERIFIED WITH: STEVEN JONES '@2350'$  ON 02/01/22 SKL    Klebsiella aerogenes NOT DETECTED NOT DETECTED Final   Klebsiella oxytoca NOT DETECTED NOT DETECTED Final   Klebsiella pneumoniae NOT DETECTED NOT DETECTED Final   Proteus species NOT DETECTED NOT DETECTED Final   Salmonella species NOT DETECTED NOT DETECTED Final   Serratia marcescens NOT DETECTED NOT DETECTED Final   Haemophilus influenzae NOT DETECTED NOT DETECTED Final   Neisseria meningitidis NOT DETECTED NOT DETECTED Final   Pseudomonas aeruginosa NOT DETECTED NOT DETECTED Final   Stenotrophomonas maltophilia NOT DETECTED NOT DETECTED Final   Candida albicans NOT DETECTED NOT DETECTED Final   Candida auris NOT DETECTED NOT DETECTED Final   Candida glabrata NOT DETECTED NOT DETECTED Final   Candida krusei NOT DETECTED NOT DETECTED Final   Candida parapsilosis NOT DETECTED NOT DETECTED Final   Candida tropicalis NOT DETECTED NOT DETECTED Final   Cryptococcus neoformans/gattii NOT DETECTED NOT DETECTED Final   CTX-M ESBL NOT DETECTED NOT DETECTED Final   Carbapenem resistance IMP NOT DETECTED NOT DETECTED Final   Carbapenem resistance KPC NOT DETECTED NOT DETECTED Final   Carbapenem resistance NDM NOT DETECTED NOT DETECTED Final   Carbapenem resist OXA 48 LIKE NOT DETECTED NOT DETECTED Final   Carbapenem resistance VIM NOT DETECTED NOT  DETECTED Final    Comment: Performed at [facility], [address] [place], [place] [address]  Culture, blood (routine x 2)     Status: None (Preliminary result)   Collection Time: 02/02/22 10:06 AM   Specimen: BLOOD RIGHT ARM  Result Value Ref Range Status   Specimen Description BLOOD RIGHT ARM  Final   Special Requests   Final    BOTTLES DRAWN AEROBIC AND ANAEROBIC Blood Culture adequate volume   Culture   Final    NO GROWTH < 24 HOURS Performed at [facility], [address] [place], [place] [address]    Report Status PENDING  Incomplete  Culture, blood (routine x 2)     Status: None (Preliminary result)   Collection Time: 02/02/22 10:06 AM   Specimen: Right Antecubital; Blood  Result Value Ref Range Status   Specimen Description RIGHT ANTECUBITAL  Final   Special Requests   Final    BOTTLES DRAWN AEROBIC AND ANAEROBIC Blood Culture adequate volume   Culture   Final    NO GROWTH < 24 HOURS Performed at [facility], [address] [place], [place] [address]    Report Status PENDING  Incomplete  Urine Culture (for pregnant, neutropenic or urologic patients or patients with an indwelling urinary catheter)     Status: None   Collection Time: 02/02/22 11:46 AM   Specimen: Urine, Clean Catch  Result Value Ref Range Status   Specimen Description   Final    URINE, CLEAN CATCH Performed at [facility], [address], [place], [place] [address]    Special Requests   Final    NONE Performed at [facility], [address] [place], [place] [address]    Culture   Final    NO GROWTH Performed at [facility], [address] [place],  [address]    Report Status 02/03/2022 FINAL  Final  MRSA Next Gen by PCR, Nasal     Status: None   Collection Time: 02/02/22  8:45 PM   Specimen: Nasal Mucosa; Nasal Swab  Result Value Ref Range Status   MRSA by PCR Next Gen NOT DETECTED NOT DETECTED  Final    Comment:  (NOTE) The GeneXpert MRSA Assay (FDA approved for NASAL specimens only), is one component of a comprehensive MRSA colonization surveillance program. It is not intended to diagnose MRSA infection nor to guide or monitor treatment for MRSA infections. Test performance is not FDA approved in patients less than 5 years old. Performed at [facility], [address] [place], [place] [address]      Labs: CBC: Recent Labs  Lab 01/31/22 1723 02/02/22 1006 02/03/22 0929  WBC 7.4 6.3 6.1  NEUTROABS 6.7 5.1  --   HGB 10.7* 9.3* 9.0*  HCT 30.8* 26.9* 25.8*  MCV 101.0* 100.7* 98.9  PLT 139* 128* 979*   Basic Metabolic Panel: Recent Labs  Lab 01/31/22 1723 02/02/22 1006 02/03/22 0459  NA 133* 129* 131*  K 3.7 3.5 3.1*  CL 92* 90* 91*  CO2 26 21* 25  GLUCOSE 137* 107* 95  BUN 37* 75* 91*  CREATININE 4.21* 6.49* 6.64*  CALCIUM 8.5* 8.6* 8.3*  MG  --   --  2.2  PHOS  --   --  5.1*   Liver Function Tests: Recent Labs  Lab 01/31/22 1723  AST 14*  ALT 12  ALKPHOS 77  BILITOT 1.4*  PROT 7.4  ALBUMIN 3.8   No results for input(s): "LIPASE", "AMYLASE" in the last 168 hours. No results for input(s): "AMMONIA" in the last 168 hours. Cardiac Enzymes: No results for input(s): "CKTOTAL", "CKMB", "CKMBINDEX", "TROPONINI" in the last 168 hours. BNP (last 3 results) Recent Labs    10/11/21 0521  BNP 573.6*   CBG: No results for input(s): "GLUCAP" in the last 168 hours.  Time spent: 35 minutes  Signed:  Val Riles  [facility] 02/03/2022 11:02 AM

## 2022-02-04 LAB — CULTURE, BLOOD (ROUTINE X 2)

## 2022-02-04 LAB — HEPATITIS B SURFACE ANTIBODY, QUANTITATIVE: Hep B S AB Quant (Post): <3.1 m[IU]/mL — ABNORMAL LOW (ref >9.9)

## 2022-02-05 ENCOUNTER — Other Ambulatory Visit (INDEPENDENT_AMBULATORY_CARE_PROVIDER_SITE_OTHER): Payer: Self-pay | Admitting: Vascular Surgery

## 2022-02-05 DIAGNOSIS — Z87891 Personal history of nicotine dependence: Secondary | ICD-10-CM | POA: Diagnosis not present

## 2022-02-05 DIAGNOSIS — N184 Chronic kidney disease, stage 4 (severe): Secondary | ICD-10-CM | POA: Diagnosis not present

## 2022-02-05 DIAGNOSIS — N186 End stage renal disease: Secondary | ICD-10-CM

## 2022-02-05 DIAGNOSIS — I129 Hypertensive chronic kidney disease with stage 1 through stage 4 chronic kidney disease, or unspecified chronic kidney disease: Secondary | ICD-10-CM | POA: Diagnosis not present

## 2022-02-05 DIAGNOSIS — D631 Anemia in chronic kidney disease: Secondary | ICD-10-CM | POA: Diagnosis not present

## 2022-02-05 DIAGNOSIS — N390 Urinary tract infection, site not specified: Secondary | ICD-10-CM | POA: Diagnosis not present

## 2022-02-05 DIAGNOSIS — R319 Hematuria, unspecified: Secondary | ICD-10-CM | POA: Diagnosis not present

## 2022-02-05 LAB — MISC LABCORP TEST (SEND OUT): Labcorp test code: 81950

## 2022-02-05 LAB — CULTURE, BLOOD (ROUTINE X 2): Culture: NO GROWTH

## 2022-02-05 LAB — HEPATITIS B E ANTIBODY: Hep B E Ab: NEGATIVE

## 2022-02-06 DIAGNOSIS — Z992 Dependence on renal dialysis: Secondary | ICD-10-CM | POA: Diagnosis not present

## 2022-02-06 DIAGNOSIS — N186 End stage renal disease: Secondary | ICD-10-CM | POA: Diagnosis not present

## 2022-02-07 ENCOUNTER — Ambulatory Visit (INDEPENDENT_AMBULATORY_CARE_PROVIDER_SITE_OTHER): Payer: Medicare HMO | Admitting: Nurse Practitioner

## 2022-02-07 ENCOUNTER — Encounter (INDEPENDENT_AMBULATORY_CARE_PROVIDER_SITE_OTHER): Payer: Self-pay | Admitting: Nurse Practitioner

## 2022-02-07 ENCOUNTER — Ambulatory Visit (INDEPENDENT_AMBULATORY_CARE_PROVIDER_SITE_OTHER): Payer: Medicare HMO

## 2022-02-07 VITALS — BP 123/69 | HR 78 | Resp 18 | Ht 70.0 in | Wt 178.4 lb

## 2022-02-07 DIAGNOSIS — N183 Chronic kidney disease, stage 3 unspecified: Secondary | ICD-10-CM

## 2022-02-07 DIAGNOSIS — E785 Hyperlipidemia, unspecified: Secondary | ICD-10-CM

## 2022-02-07 DIAGNOSIS — N186 End stage renal disease: Secondary | ICD-10-CM

## 2022-02-07 DIAGNOSIS — I129 Hypertensive chronic kidney disease with stage 1 through stage 4 chronic kidney disease, or unspecified chronic kidney disease: Secondary | ICD-10-CM

## 2022-02-07 LAB — CULTURE, BLOOD (ROUTINE X 2)
Culture: NO GROWTH
Culture: NO GROWTH
Special Requests: ADEQUATE
Special Requests: ADEQUATE

## 2022-02-08 ENCOUNTER — Ambulatory Visit (INDEPENDENT_AMBULATORY_CARE_PROVIDER_SITE_OTHER): Payer: Medicare HMO | Admitting: Nurse Practitioner

## 2022-02-08 ENCOUNTER — Encounter (INDEPENDENT_AMBULATORY_CARE_PROVIDER_SITE_OTHER): Payer: Medicare HMO

## 2022-02-08 DIAGNOSIS — N186 End stage renal disease: Secondary | ICD-10-CM | POA: Diagnosis not present

## 2022-02-08 DIAGNOSIS — Z992 Dependence on renal dialysis: Secondary | ICD-10-CM | POA: Diagnosis not present

## 2022-02-10 DIAGNOSIS — Z992 Dependence on renal dialysis: Secondary | ICD-10-CM | POA: Diagnosis not present

## 2022-02-10 DIAGNOSIS — N186 End stage renal disease: Secondary | ICD-10-CM | POA: Diagnosis not present

## 2022-02-13 DIAGNOSIS — Z992 Dependence on renal dialysis: Secondary | ICD-10-CM | POA: Diagnosis not present

## 2022-02-13 DIAGNOSIS — N186 End stage renal disease: Secondary | ICD-10-CM | POA: Diagnosis not present

## 2022-02-14 DIAGNOSIS — N186 End stage renal disease: Secondary | ICD-10-CM | POA: Diagnosis not present

## 2022-02-14 DIAGNOSIS — Z992 Dependence on renal dialysis: Secondary | ICD-10-CM | POA: Diagnosis not present

## 2022-02-15 DIAGNOSIS — H269 Unspecified cataract: Secondary | ICD-10-CM | POA: Diagnosis not present

## 2022-02-15 DIAGNOSIS — H25811 Combined forms of age-related cataract, right eye: Secondary | ICD-10-CM | POA: Diagnosis not present

## 2022-02-17 DIAGNOSIS — Z992 Dependence on renal dialysis: Secondary | ICD-10-CM | POA: Diagnosis not present

## 2022-02-17 DIAGNOSIS — N186 End stage renal disease: Secondary | ICD-10-CM | POA: Diagnosis not present

## 2022-02-20 DIAGNOSIS — N390 Urinary tract infection, site not specified: Secondary | ICD-10-CM | POA: Diagnosis not present

## 2022-02-20 DIAGNOSIS — N186 End stage renal disease: Secondary | ICD-10-CM | POA: Diagnosis not present

## 2022-02-20 DIAGNOSIS — Z992 Dependence on renal dialysis: Secondary | ICD-10-CM | POA: Diagnosis not present

## 2022-02-20 DIAGNOSIS — B9620 Unspecified Escherichia coli [E. coli] as the cause of diseases classified elsewhere: Secondary | ICD-10-CM | POA: Diagnosis not present

## 2022-02-22 DIAGNOSIS — N186 End stage renal disease: Secondary | ICD-10-CM | POA: Diagnosis not present

## 2022-02-22 DIAGNOSIS — Z992 Dependence on renal dialysis: Secondary | ICD-10-CM | POA: Diagnosis not present

## 2022-02-23 DIAGNOSIS — H2511 Age-related nuclear cataract, right eye: Secondary | ICD-10-CM | POA: Diagnosis not present

## 2022-02-24 DIAGNOSIS — N186 End stage renal disease: Secondary | ICD-10-CM | POA: Diagnosis not present

## 2022-02-24 DIAGNOSIS — Z992 Dependence on renal dialysis: Secondary | ICD-10-CM | POA: Diagnosis not present

## 2022-02-25 ENCOUNTER — Encounter (INDEPENDENT_AMBULATORY_CARE_PROVIDER_SITE_OTHER): Payer: Self-pay | Admitting: Nurse Practitioner

## 2022-02-25 NOTE — Progress Notes (Signed)
Subjective:    Patient ID: Lawrence Perkins., male    DOB: Mar 08, 1940, 82 y.o.   MRN: [id number] Chief Complaint  Patient presents with   Routine Post Op    [facility] 5 week with HDA    The patient returns to the office for followup of their dialysis access.  This is a first look at his left brachiocephalic AV fistula created on 01/04/2022.   The patient denies hand pain or other symptoms consistent with steal phenomena.  No significant arm swelling.   The patient denies redness or swelling at the access site. The patient denies fever or chills at home or while on dialysis.  No recent shortening of the patient's walking distance or new symptoms consistent with claudication.  No history of rest pain symptoms. No new ulcers or wounds of the lower extremities have occurred.  The patient denies amaurosis fugax or recent TIA symptoms. There are no recent neurological changes noted. There is no history of DVT, PE or superficial thrombophlebitis. No recent episodes of angina or shortness of breath documented.   Duplex ultrasound of the AV access shows a patent access.  The flow volume is 2228      Review of Systems  All other systems reviewed and are negative.      Objective:   Physical Exam Vitals reviewed.  HENT:     Head: Normocephalic.  Cardiovascular:     Rate and Rhythm: Normal rate.     Arteriovenous access: Left arteriovenous access is present.    Comments: Good thrill and bruit Skin:    General: Skin is warm and dry.  Neurological:     Mental Status: He is alert and oriented to person, place, and time.  Psychiatric:        Mood and Affect: Mood normal.        Behavior: Behavior normal.        Thought Content: Thought content normal.        Judgment: Judgment normal.     BP 123/69 (BP Location: Right Arm)   Pulse 78   Resp 18   Ht '5\' 10"'$  (1.778 m)   Wt 178 lb 6.4 oz (80.9 kg)   BMI 25.60 kg/m   Past Medical History:  Diagnosis Date   Anemia    Aortic  atherosclerosis (HCC)    Arthritis    Atrial fibrillation and flutter (HCC)    a.) CHA2DS2-VASc = 5 (age x 2, CHF, HTN, aortic plaque). b.) rate/rhythm maintain on oral carvedilol; chronically anticoagulated with dose reduced apixaban   Bilateral inguinal hernia    BPH (benign prostatic hyperplasia)    Cardiac syncope    Cardiomyopathy ([place])    a.) TTE 04/23/2016: EF 40%. b.) TTE 09/03/2016: EF 50%. c.) TTE 05/04/2019: EF >55%.   CHF (congestive heart failure) ([place])    a.) TTE 04/23/2016: EF 40%; BAE; mild AR/TR, mod MR; G1DD. b.) TTE 09/03/2016: EF 50%; mild LVH; BAE; triv AR/PR, mod MR/TR. c.) TTE 05/04/2019: EF >55%; mod LVH, mild LA enlargement; mild AR/MR/TR   CKD (chronic kidney disease), stage V (HCC)    Complication of anesthesia    a.) h/o intra/postoperative A.fib   Coronary artery disease    Diverticulosis    Gout    History of 2019 novel coronavirus disease (COVID-19) 11/14/2020   History of colon polyps    Hypercholesteremia    Hypertension    Long term current use of anticoagulant    a.) dose reduced apixaban; dose  reduction d/t to age and CKD Dx.   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[place])    a.) s/p resection   MGUS (monoclonal gammopathy of unknown significance)    Pleural effusion    Rectal bleeding    Secondary hyperparathyroidism of renal origin ([place])     Social History   Socioeconomic History   Marital status: Married    Spouse name: Lawrence Perkins   Number of children: Not on file   Years of education: Not on file   Highest education level: Not on file  Occupational History   Occupation: Retired  Tobacco Use   Smoking status: Former    Years: 15.00    Types: Cigarettes    Quit date: 01/01/1981    Years since quitting: 41.1   Smokeless tobacco: Never  Vaping Use   Vaping Use: Never used  Substance and Sexual Activity   Alcohol use: Not Currently   Drug use: No   Sexual activity: Yes    Birth control/protection: None  Other Topics Concern   Not on file  Social History  Narrative   Regular exercise: Yes   Social Determinants of Health   Financial Resource Strain: Not on file  Food Insecurity: No Food Insecurity (02/02/2022)   Hunger Vital Sign    Worried About Running Out of Food in the Last Year: Never true    [place] in the Last Year: Never true  Transportation Needs: No Transportation Needs (02/02/2022)   PRAPARE - Hydrologist (Medical): No    Lack of Transportation (Non-Medical): No  Physical Activity: Not on file  Stress: Not on file  Social Connections: Not on file  Intimate Partner Violence: Not At Risk (02/02/2022)   Humiliation, Afraid, Rape, and Kick questionnaire    Fear of Current or Ex-Partner: No    Emotionally Abused: No    Physically Abused: No    Sexually Abused: No    Past Surgical History:  Procedure Laterality Date   ANKLE SURGERY Right 1985   following volleyball injury   APPENDECTOMY     at the of 9 yrs   AV FISTULA PLACEMENT Left 01/04/2022   Procedure: ARTERIOVENOUS (AV) FISTULA CREATION;  Surgeon: Algernon Huxley, MD;  Location: [facility];  Service: Vascular;  Laterality: Left;   CAPD INSERTION N/A 08/10/2021   Procedure: LAPAROSCOPIC INSERTION CONTINUOUS AMBULATORY PERITONEAL DIALYSIS  (CAPD) CATHETER;  Surgeon: Jules Husbands, MD;  Location: [facility];  Service: General;  Laterality: N/A;   CAPD REVISION N/A 09/21/2021   Procedure: LAPAROSCOPIC REVISION CONTINUOUS AMBULATORY PERITONEAL DIALYSIS  (CAPD) CATHETER;  Surgeon: Jules Husbands, MD;  Location: [facility];  Service: General;  Laterality: N/A;   CATARACT EXTRACTION Left    COLONOSCOPY     COLONOSCOPY WITH PROPOFOL N/A 01/02/2017   Procedure: COLONOSCOPY WITH PROPOFOL;  Surgeon: Manya Silvas, MD;  Location: [facility] ENDOSCOPY;  Service: Endoscopy;  Laterality: N/A;   DIALYSIS/PERMA CATHETER INSERTION N/A 03/27/2021   Procedure: DIALYSIS/PERMA CATHETER INSERTION;  Surgeon: Algernon Huxley, MD;  Location: [place] [facility];  Service:  Cardiovascular;  Laterality: N/A;   DIALYSIS/PERMA CATHETER INSERTION N/A 10/10/2021   Procedure: DIALYSIS/PERMA CATHETER INSERTION;  Surgeon: Katha Cabal, MD;  Location: [place] [facility];  Service: Cardiovascular;  Laterality: N/A;   DIALYSIS/PERMA CATHETER REMOVAL N/A 05/08/2021   Procedure: DIALYSIS/PERMA CATHETER REMOVAL;  Surgeon: Algernon Huxley, MD;  Location: [place] [facility];  Service: Cardiovascular;  Laterality: N/A;   INSERTION OF  MESH  08/10/2021   Procedure: INSERTION OF MESH;  Surgeon: Jules Husbands, MD;  Location: [facility];  Service: General;;   JOINT REPLACEMENT     RIGHT TOTAL HIP   KNEE ARTHROSCOPY WITH LATERAL RELEASE Right 04/20/2021   Procedure: Right knee medial retinaculum and lateral release with polyethylene exchange;  Surgeon: Hessie Knows, MD;  Location: [facility];  Service: Orthopedics;  Laterality: Right;   TONSILLECTOMY     as a child   TOTAL HIP ARTHROPLASTY  09/2009   Right femoral neck fracture   TOTAL HIP ARTHROPLASTY Left 03/06/2016   Procedure: TOTAL HIP ARTHROPLASTY ANTERIOR APPROACH;  Surgeon: Hessie Knows, MD;  Location: [facility];  Service: Orthopedics;  Laterality: Left;   TOTAL KNEE ARTHROPLASTY Right 03/21/2021   Procedure: TOTAL KNEE ARTHROPLASTY;  Surgeon: Hessie Knows, MD;  Location: [facility];  Service: Orthopedics;  Laterality: Right;   UMBILICAL HERNIA REPAIR  08/10/2021   Procedure: HERNIA REPAIR UMBILICAL ADULT;  Surgeon: Jules Husbands, MD;  Location: [facility];  Service: General;;    Family History  Problem Relation Age of Onset   Cirrhosis Mother    Other Father        Lung Fibrosis   Prostate cancer Neg Hx    Kidney cancer Neg Hx    Bladder Cancer Neg Hx     No Known Allergies     Latest Ref Rng & Units 02/03/2022    9:29 AM 02/02/2022   10:06 AM 01/31/2022    5:23 PM  CBC  WBC 4.0 - 10.5 K/uL 6.1  6.3  7.4   Hemoglobin 13.0 - 17.0 g/dL 9.0  9.3  10.7   Hematocrit 39.0 - 52.0 % 25.8  26.9  30.8   Platelets 150 -  400 K/uL 132  128  139       CMP     Component Value Date/Time   NA 131 (L) 02/03/2022 0459   NA 139 02/04/2014 1449   K 3.1 (L) 02/03/2022 0459   K 4.6 02/04/2014 1449   CL 91 (L) 02/03/2022 0459   CL 105 02/04/2014 1449   CO2 25 02/03/2022 0459   CO2 27 02/04/2014 1449   GLUCOSE 95 02/03/2022 0459   GLUCOSE 84 02/04/2014 1449   BUN 91 (H) 02/03/2022 0459   BUN 23 (H) 02/04/2014 1449   CREATININE 6.64 (H) 02/03/2022 0459   CREATININE 1.25 02/04/2014 1449   CALCIUM 8.3 (L) 02/03/2022 0459   CALCIUM 8.7 02/04/2014 1449   PROT 7.4 01/31/2022 1723   ALBUMIN 3.8 01/31/2022 1723   AST 14 (L) 01/31/2022 1723   ALT 12 01/31/2022 1723   ALKPHOS 77 01/31/2022 1723   BILITOT 1.4 (H) 01/31/2022 1723   GFRNONAA 8 (L) 02/03/2022 0459   GFRNONAA >60 02/04/2014 1449   GFRAA 22 (L) 09/15/2019 0856   GFRAA >60 02/04/2014 1449     No results found.     Assessment & Plan:   1. ESRD (end stage renal disease) ([place]) Based on physical exam patient's noninvasive studies he has adequate access to being using his left brachiocephalic AV fistula at dialysis.  Based on this we will send a letter to his dialysis center at [facility] on [address].  Will plan on having the patient resume with normal follow-up every 6 months or sooner if the patient begins to have noted issues during dialysis. - VAS US DUPLEX DIALYSIS ACCESS (AVF, AVG)  2. Hyperlipidemia, unspecified hyperlipidemia type Continue statin as ordered and reviewed, no  changes at this time  3. Benign hypertension with chronic kidney disease, stage III (HCC) Continue antihypertensive medications as already ordered, these medications have been reviewed and there are no changes at this time.   Current Outpatient Medications on File Prior to Visit  Medication Sig Dispense Refill   apixaban (ELIQUIS) 2.5 MG TABS tablet Take 2.5 mg by mouth 2 (two) times daily.     B Complex Vitamins (B-COMPLEX/B-12 PO) Take 1,000 mcg by mouth.      calcitRIOL (ROCALTROL) 0.25 MCG capsule Take 0.25 mcg by mouth daily.     carvedilol (COREG) 3.125 MG tablet Take 3.125 mg by mouth 2 (two) times daily with a meal.      cholecalciferol (VITAMIN D3) 25 MCG (1000 UNIT) tablet Take 1,000 Units by mouth daily.     cyanocobalamin (VITAMIN B12) 1000 MCG tablet Take 2,500 mcg by mouth daily.     Multiple Vitamin (MULTIVITAMIN WITH MINERALS) TABS tablet Take 1 tablet by mouth daily.     rosuvastatin (CRESTOR) 10 MG tablet Take 10 mg by mouth at bedtime.     Saccharomyces boulardii (PROBIOTIC) 250 MG CAPS Take 1 capsule by mouth in the morning and at bedtime. 12 capsule 0   senna-docusate (SENOKOT-S) 8.6-50 MG tablet Take 1 tablet by mouth daily as needed.     tamsulosin (FLOMAX) 0.4 MG CAPS capsule Take 1 capsule (0.4 mg total) by mouth daily. 90 capsule 3   torsemide (DEMADEX) 20 MG tablet Take 2 tablets (40 mg total) by mouth daily. 60 tablet 0   traZODone (DESYREL) 50 MG tablet Take 50 mg by mouth at bedtime as needed for sleep.     No current facility-administered medications on file prior to visit.    There are no Patient Instructions on file for this visit. No follow-ups on file.   Kris Hartmann, NP

## 2022-02-27 DIAGNOSIS — Z992 Dependence on renal dialysis: Secondary | ICD-10-CM | POA: Diagnosis not present

## 2022-02-27 DIAGNOSIS — N186 End stage renal disease: Secondary | ICD-10-CM | POA: Diagnosis not present

## 2022-03-01 DIAGNOSIS — N186 End stage renal disease: Secondary | ICD-10-CM | POA: Diagnosis not present

## 2022-03-01 DIAGNOSIS — Z992 Dependence on renal dialysis: Secondary | ICD-10-CM | POA: Diagnosis not present

## 2022-03-03 DIAGNOSIS — Z992 Dependence on renal dialysis: Secondary | ICD-10-CM | POA: Diagnosis not present

## 2022-03-03 DIAGNOSIS — N186 End stage renal disease: Secondary | ICD-10-CM | POA: Diagnosis not present

## 2022-03-05 ENCOUNTER — Other Ambulatory Visit (INDEPENDENT_AMBULATORY_CARE_PROVIDER_SITE_OTHER): Payer: Self-pay | Admitting: Nurse Practitioner

## 2022-03-05 ENCOUNTER — Ambulatory Visit (INDEPENDENT_AMBULATORY_CARE_PROVIDER_SITE_OTHER): Payer: Medicare HMO

## 2022-03-05 DIAGNOSIS — N186 End stage renal disease: Secondary | ICD-10-CM

## 2022-03-05 DIAGNOSIS — T829XXS Unspecified complication of cardiac and vascular prosthetic device, implant and graft, sequela: Secondary | ICD-10-CM

## 2022-03-06 DIAGNOSIS — N186 End stage renal disease: Secondary | ICD-10-CM | POA: Diagnosis not present

## 2022-03-06 DIAGNOSIS — Z992 Dependence on renal dialysis: Secondary | ICD-10-CM | POA: Diagnosis not present

## 2022-03-08 DIAGNOSIS — Z992 Dependence on renal dialysis: Secondary | ICD-10-CM | POA: Diagnosis not present

## 2022-03-08 DIAGNOSIS — N186 End stage renal disease: Secondary | ICD-10-CM | POA: Diagnosis not present

## 2022-03-10 DIAGNOSIS — Z992 Dependence on renal dialysis: Secondary | ICD-10-CM | POA: Diagnosis not present

## 2022-03-10 DIAGNOSIS — N186 End stage renal disease: Secondary | ICD-10-CM | POA: Diagnosis not present

## 2022-03-13 ENCOUNTER — Ambulatory Visit (INDEPENDENT_AMBULATORY_CARE_PROVIDER_SITE_OTHER): Payer: Medicare HMO | Admitting: Vascular Surgery

## 2022-03-13 DIAGNOSIS — Z992 Dependence on renal dialysis: Secondary | ICD-10-CM | POA: Diagnosis not present

## 2022-03-13 DIAGNOSIS — N186 End stage renal disease: Secondary | ICD-10-CM | POA: Diagnosis not present

## 2022-03-15 DIAGNOSIS — Z992 Dependence on renal dialysis: Secondary | ICD-10-CM | POA: Diagnosis not present

## 2022-03-15 DIAGNOSIS — N186 End stage renal disease: Secondary | ICD-10-CM | POA: Diagnosis not present

## 2022-03-17 DIAGNOSIS — Z992 Dependence on renal dialysis: Secondary | ICD-10-CM | POA: Diagnosis not present

## 2022-03-17 DIAGNOSIS — N186 End stage renal disease: Secondary | ICD-10-CM | POA: Diagnosis not present

## 2022-03-20 ENCOUNTER — Ambulatory Visit (INDEPENDENT_AMBULATORY_CARE_PROVIDER_SITE_OTHER): Payer: Medicare HMO | Admitting: Vascular Surgery

## 2022-03-20 ENCOUNTER — Encounter (INDEPENDENT_AMBULATORY_CARE_PROVIDER_SITE_OTHER): Payer: Self-pay | Admitting: Vascular Surgery

## 2022-03-20 VITALS — BP 137/58 | HR 45 | Resp 18 | Ht 70.0 in | Wt 178.2 lb

## 2022-03-20 DIAGNOSIS — N186 End stage renal disease: Secondary | ICD-10-CM

## 2022-03-20 DIAGNOSIS — Z992 Dependence on renal dialysis: Secondary | ICD-10-CM | POA: Diagnosis not present

## 2022-03-20 DIAGNOSIS — E785 Hyperlipidemia, unspecified: Secondary | ICD-10-CM

## 2022-03-20 NOTE — Assessment & Plan Note (Signed)
lipid control important in reducing the progression of atherosclerotic disease. Continue statin therapy  

## 2022-03-20 NOTE — Assessment & Plan Note (Signed)
A duplex performed about 2 weeks ago showed a patent left brachiocephalic AV fistula without significant stenosis.  There was a medium size competing branch present.  This fistula should be a good durable access for some time.  Will recheck this in 6 months with duplex.  Once he is using this completely, he can have his catheter removed.  This will be done as an outpatient.

## 2022-03-20 NOTE — Progress Notes (Signed)
MRN : [id number]  Lawrence Perkins. is a 82 y.o. (Jan 25, 1940) male who presents with chief complaint of  Chief Complaint  Patient presents with   Follow-up    f/u ultrasound  .  History of Present Illness: Patient returns today in follow up of his AV fistula.  He is about 10 weeks status post left brachiocephalic AV fistula creation.  He tried to use this initially about 3 weeks ago but infiltrated him significantly.  He is now using 1 needle in the arm and his catheter for the other access and that is working well.  He stopped after 2 treatments.  A duplex performed about 2 weeks ago showed a patent left brachiocephalic AV fistula without significant stenosis.  There was a medium size competing branch present.  Current Outpatient Medications  Medication Sig Dispense Refill   apixaban (ELIQUIS) 2.5 MG TABS tablet Take 2.5 mg by mouth 2 (two) times daily.     B Complex Vitamins (B-COMPLEX/B-12 PO) Take 1,000 mcg by mouth.     calcitRIOL (ROCALTROL) 0.25 MCG capsule Take 0.25 mcg by mouth daily.     carvedilol (COREG) 3.125 MG tablet Take 3.125 mg by mouth 2 (two) times daily with a meal.      cholecalciferol (VITAMIN D3) 25 MCG (1000 UNIT) tablet Take 1,000 Units by mouth daily.     cyanocobalamin (VITAMIN B12) 1000 MCG tablet Take 2,500 mcg by mouth daily.     Multiple Vitamin (MULTIVITAMIN WITH MINERALS) TABS tablet Take 1 tablet by mouth daily.     rosuvastatin (CRESTOR) 10 MG tablet Take 10 mg by mouth at bedtime.     Saccharomyces boulardii (PROBIOTIC) 250 MG CAPS Take 1 capsule by mouth in the morning and at bedtime. 12 capsule 0   senna-docusate (SENOKOT-S) 8.6-50 MG tablet Take 1 tablet by mouth daily as needed.     tamsulosin (FLOMAX) 0.4 MG CAPS capsule Take 1 capsule (0.4 mg total) by mouth daily. 90 capsule 3   torsemide (DEMADEX) 20 MG tablet Take 2 tablets (40 mg total) by mouth daily. 60 tablet 0   traZODone (DESYREL) 50 MG tablet Take 50 mg by mouth at bedtime as needed  for sleep.     No current facility-administered medications for this visit.    Past Medical History:  Diagnosis Date   Anemia    Aortic atherosclerosis (HCC)    Arthritis    Atrial fibrillation and flutter ([place])    a.) CHA2DS2-VASc = 5 (age x 2, CHF, HTN, aortic plaque). b.) rate/rhythm maintain on oral carvedilol; chronically anticoagulated with dose reduced apixaban   Bilateral inguinal hernia    BPH (benign prostatic hyperplasia)    Cardiac syncope    Cardiomyopathy ([place])    a.) TTE 04/23/2016: EF 40%. b.) TTE 09/03/2016: EF 50%. c.) TTE 05/04/2019: EF >55%.   CHF (congestive heart failure) ([place])    a.) TTE 04/23/2016: EF 40%; BAE; mild AR/TR, mod MR; G1DD. b.) TTE 09/03/2016: EF 50%; mild LVH; BAE; triv AR/PR, mod MR/TR. c.) TTE 05/04/2019: EF >55%; mod LVH, mild LA enlargement; mild AR/MR/TR   CKD (chronic kidney disease), stage V (HCC)    Complication of anesthesia    a.) h/o intra/postoperative A.fib   Coronary artery disease    Diverticulosis    Gout    History of 2019 novel coronavirus disease (COVID-19) 11/14/2020   History of colon polyps    Hypercholesteremia    Hypertension    Long term current use of  anticoagulant    a.) dose reduced apixaban; dose reduction d/t to age and CKD Dx.   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[place])    a.) s/p resection   MGUS (monoclonal gammopathy of unknown significance)    Pleural effusion    Rectal bleeding    Secondary hyperparathyroidism of renal origin [facility])     Past Surgical History:  Procedure Laterality Date   ANKLE SURGERY Right 1985   following volleyball injury   APPENDECTOMY     at the of 9 yrs   AV FISTULA PLACEMENT Left 01/04/2022   Procedure: ARTERIOVENOUS (AV) FISTULA CREATION;  Surgeon: Algernon Huxley, MD;  Location: [facility] ORS;  Service: Vascular;  Laterality: Left;   CAPD INSERTION N/A 08/10/2021   Procedure: LAPAROSCOPIC INSERTION CONTINUOUS AMBULATORY PERITONEAL DIALYSIS  (CAPD) CATHETER;  Surgeon: Jules Husbands, MD;  Location:  [facility] ORS;  Service: General;  Laterality: N/A;   CAPD REVISION N/A 09/21/2021   Procedure: LAPAROSCOPIC REVISION CONTINUOUS AMBULATORY PERITONEAL DIALYSIS  (CAPD) CATHETER;  Surgeon: Jules Husbands, MD;  Location: [facility] ORS;  Service: General;  Laterality: N/A;   CATARACT EXTRACTION Left    COLONOSCOPY     COLONOSCOPY WITH PROPOFOL N/A 01/02/2017   Procedure: COLONOSCOPY WITH PROPOFOL;  Surgeon: Manya Silvas, MD;  Location[facility] END[facility];  Service: Endoscopy;  Laterality: N/A;   DIALYSIS/PERMA CATHETER INSERTION N/A 03/27/2021   Procedure: DIALYSIS/PERMA CATHETER INSERTION;  Surgeon: Algernon Huxley, MD;  Location: [place] CV LAB;  Service: Cardiovascular;  Laterality: N/A;   DIALYSIS/PERMA CATHETER INSERTION N/A 10/10/2021   Procedure: DIALYSIS/PERMA CATHETER INSERTION;  Surgeon: Katha Cabal, MD;  Location: [place] CV LAB;  Service: Cardiovascular;  Laterality: N/A;   DIALYSIS/PERMA CATHETER REMOVAL N/A 05/08/2021   Procedure: DIALYSIS/PERMA CATHETER REMOVAL;  Surgeon: Algernon Huxley, MD;  Location: [place] CV LAB;  Service: Cardiovascular;  Laterality: N/A;   INSERTION OF MESH  08/10/2021   Procedure: INSERTION OF MESH;  Surgeon: Jules Husbands, MD;  Location: [facility] ORS;  Service: General;;   JOINT REPLACEMENT     RIGHT TOTAL HIP   KNEE ARTHROSCOPY WITH LATERAL RELEASE Right 04/20/2021   Procedure: Right knee medial retinaculum and lateral release with polyethylene exchange;  Surgeon: Hessie Knows, MD;  Location: [facility] ORS;  Service: Orthopedics;  Laterality: Right;   TONSILLECTOMY     as a child   TOTAL HIP ARTHROPLASTY  09/2009   Right femoral neck fracture   TOTAL HIP ARTHROPLASTY Left 03/06/2016   Procedure: TOTAL HIP ARTHROPLASTY ANTERIOR APPROACH;  Surgeon: Hessie Knows, MD;  Location: [facility] ORS;  Service: Orthopedics;  Laterality: Left;   TOTAL KNEE ARTHROPLASTY Right 03/21/2021   Procedure: TOTAL KNEE ARTHROPLASTY;  Surgeon: Hessie Knows, MD;  Location:  [facility] ORS;  Service: Orthopedics;  Laterality: Right;   UMBILICAL HERNIA REPAIR  08/10/2021   Procedure: HERNIA REPAIR UMBILICAL ADULT;  Surgeon: Jules Husbands, MD;  Location: [facility] ORS;  Service: General;;     Social History   Tobacco Use   Smoking status: Former    Years: 15    Types: Cigarettes    Quit date: 01/01/1981    Years since quitting: 41.2   Smokeless tobacco: Never  Vaping Use   Vaping Use: Never used  Substance Use Topics   Alcohol use: Not Currently   Drug use: No       Family History  Problem Relation Age of Onset   Cirrhosis Mother    Other Father  Lung Fibrosis   Prostate cancer Neg Hx    Kidney cancer Neg Hx    Bladder Cancer Neg Hx      No Known Allergies   REVIEW OF SYSTEMS (Negative unless checked)  Constitutional: [] Weight loss  [] Fever  [] Chills Cardiac: [] Chest pain   [] Chest pressure   [] Palpitations   [] Shortness of breath when laying flat   [] Shortness of breath at rest   [x] Shortness of breath with exertion. Vascular:  [] Pain in legs with walking   [] Pain in legs at rest   [] Pain in legs when laying flat   [] Claudication   [] Pain in feet when walking  [] Pain in feet at rest  [] Pain in feet when laying flat   [] History of DVT   [] Phlebitis   [] Swelling in legs   [] Varicose veins   [] Non-healing ulcers Pulmonary:   [] Uses home oxygen   [] Productive cough   [] Hemoptysis   [] Wheeze  [] COPD   [] Asthma Neurologic:  [] Dizziness  [] Blackouts   [] Seizures   [] History of stroke   [] History of TIA  [] Aphasia   [] Temporary blindness   [] Dysphagia   [] Weakness or numbness in arms   [] Weakness or numbness in legs Musculoskeletal:  [x] Arthritis   [] Joint swelling   [] Joint pain   [] Low back pain Hematologic:  [] Easy bruising  [] Easy bleeding   [] Hypercoagulable state   [x] Anemic   Gastrointestinal:  [] Blood in stool   [] Vomiting blood  [] Gastroesophageal reflux/heartburn   [] Abdominal pain Genitourinary:  [x] Chronic kidney disease   [] Difficult  urination  [] Frequent urination  [] Burning with urination   [] Hematuria Skin:  [] Rashes   [] Ulcers   [] Wounds Psychological:  [] History of anxiety   []  History of major depression.  Physical Examination  BP (!) 137/58 (BP Location: Right Arm)   Pulse (!) 45   Resp 18   Ht 5\' 10"  (1.778 m)   Wt 178 lb 3.2 oz (80.8 kg)   BMI 25.57 kg/m  Gen:  WD/WN, NAD. Appears younger than stated age. Head: [place]/AT, No temporalis wasting. Ear/Nose/Throat: Hearing grossly intact, nares w/o erythema or drainage Eyes: Conjunctiva clear. Sclera non-icteric Neck: Supple.  Trachea midline Pulmonary:  Good air movement, no use of accessory muscles.  Cardiac: RRR, no JVD Vascular:  Vessel Right Left  Radial Palpable Palpable               Musculoskeletal: M/S 5/5 throughout.  No deformity or atrophy. Good thrill in left brachiocephalic AVF.  Trace LE edema. Neurologic: Sensation grossly intact in extremities.  Symmetrical.  Speech is fluent.  Psychiatric: Judgment intact, Mood & affect appropriate for pt's clinical situation. Dermatologic: No rashes or ulcers noted.  No cellulitis or open wounds.      Labs Recent Results (from the past 2160 hour(s))  Type and screen     Status: None   Collection Time: 12/27/21  1:10 PM  Result Value Ref Range   ABO/RH(D) O POS    Antibody Screen NEG    Sample Expiration 01/10/2022,2359    Extend sample reason      NO TRANSFUSIONS OR PREGNANCY IN THE PAST 3 MONTHS Performed at [facility], [address] [place], [place] [address]   CBC with Differential/Platelet     Status: Abnormal   Collection Time: 12/27/21  1:10 PM  Result Value Ref Range   WBC 5.6 4.0 - 10.5 K/uL   RBC 2.91 (L) 4.22 - 5.81 MIL/uL   Hemoglobin 10.2 (L) 13.0 - 17.0 g/dL  HCT 29.6 (L) 39.0 - 52.0 %   MCV 101.7 (H) 80.0 - 100.0 fL   MCH 35.1 (H) 26.0 - 34.0 pg   MCHC 34.5 30.0 - 36.0 g/dL   RDW 12.8 11.5 - 15.5 %   Platelets 166 150 - 400 K/uL   nRBC 0.0 0.0 - 0.2 %    Neutrophils Relative % 71 %   Neutro Abs 3.9 1.7 - 7.7 K/uL   Lymphocytes Relative 18 %   Lymphs Abs 1.0 0.7 - 4.0 K/uL   Monocytes Relative 7 %   Monocytes Absolute 0.4 0.1 - 1.0 K/uL   Eosinophils Relative 4 %   Eosinophils Absolute 0.2 0.0 - 0.5 K/uL   Basophils Relative 0 %   Basophils Absolute 0.0 0.0 - 0.1 K/uL   Immature Granulocytes 0 %   Abs Immature Granulocytes 0.02 0.00 - 0.07 K/uL    Comment: Performed at [facility], [address], [place], [place] [id number]  Basic metabolic panel     Status: Abnormal   Collection Time: 12/27/21  1:10 PM  Result Value Ref Range   Sodium 139 135 - 145 mmol/L   Potassium 3.5 3.5 - 5.1 mmol/L   Chloride 98 98 - 111 mmol/L   CO2 29 22 - 32 mmol/L   Glucose, Bld 100 (H) 70 - 99 mg/dL    Comment: Glucose reference range applies only to samples taken after fasting for at least 8 hours.   BUN 35 (H) 8 - 23 mg/dL   Creatinine, Ser 3.36 (H) 0.61 - 1.24 mg/dL   Calcium 8.9 8.9 - 10.3 mg/dL   GFR, Estimated 18 (L) >60 mL/min    Comment: (NOTE) Calculated using the CKD-EPI Creatinine Equation (2021)    Anion gap 12 5 - 15    Comment: Performed at [facility], [address] [place], [place] [address]  I-STAT, [place] 8     Status: Abnormal   Collection Time: 01/04/22 10:59 AM  Result Value Ref Range   Sodium 135 135 - 145 mmol/L   Potassium 3.7 3.5 - 5.1 mmol/L   Chloride 96 (L) 98 - 111 mmol/L   BUN 27 (H) 8 - 23 mg/dL   Creatinine, Ser 3.80 (H) 0.61 - 1.24 mg/dL   Glucose, Bld 91 70 - 99 mg/dL    Comment: Glucose reference range applies only to samples taken after fasting for at least 8 hours.   Calcium, Ion 1.16 1.15 - 1.40 mmol/L   TCO2 29 22 - 32 mmol/L   Hemoglobin 11.2 (L) 13.0 - 17.0 g/dL   HCT 33.0 (L) 39.0 - 52.0 %  Comprehensive metabolic panel     Status: Abnormal   Collection Time: 01/31/22  5:23 PM  Result Value Ref Range   Sodium 133 (L) 135 - 145 mmol/L   Potassium 3.7 3.5 - 5.1 mmol/L   Chloride  92 (L) 98 - 111 mmol/L   CO2 26 22 - 32 mmol/L   Glucose, Bld 137 (H) 70 - 99 mg/dL    Comment: Glucose reference range applies only to samples taken after fasting for at least 8 hours.   BUN 37 (H) 8 - 23 mg/dL   Creatinine, Ser 4.21 (H) 0.61 - 1.24 mg/dL   Calcium 8.5 (L) 8.9 - 10.3 mg/dL   Total Protein 7.4 6.5 - 8.1 g/dL   Albumin 3.8 3.5 - 5.0 g/dL   AST 14 (L) 15 - 41 U/L   ALT 12 0 - 44 U/L  Alkaline Phosphatase 77 38 - 126 U/L   Total Bilirubin 1.4 (H) 0.3 - 1.2 mg/dL   GFR, Estimated 13 (L) >60 mL/min    Comment: (NOTE) Calculated using the CKD-EPI Creatinine Equation (2021)    Anion gap 15 5 - 15    Comment: Performed at [facility], [address] [place], [place] [address]  CBC with Differential     Status: Abnormal   Collection Time: 01/31/22  5:23 PM  Result Value Ref Range   WBC 7.4 4.0 - 10.5 K/uL   RBC 3.05 (L) 4.22 - 5.81 MIL/uL   Hemoglobin 10.7 (L) 13.0 - 17.0 g/dL   HCT 30.8 (L) 39.0 - 52.0 %   MCV 101.0 (H) 80.0 - 100.0 fL   MCH 35.1 (H) 26.0 - 34.0 pg   MCHC 34.7 30.0 - 36.0 g/dL   RDW 13.4 11.5 - 15.5 %   Platelets 139 (L) 150 - 400 K/uL   nRBC 0.0 0.0 - 0.2 %   Neutrophils Relative % 91 %   Neutro Abs 6.7 1.7 - 7.7 K/uL   Lymphocytes Relative 3 %   Lymphs Abs 0.2 (L) 0.7 - 4.0 K/uL   Monocytes Relative 6 %   Monocytes Absolute 0.4 0.1 - 1.0 K/uL   Eosinophils Relative 0 %   Eosinophils Absolute 0.0 0.0 - 0.5 K/uL   Basophils Relative 0 %   Basophils Absolute 0.0 0.0 - 0.1 K/uL   Immature Granulocytes 0 %   Abs Immature Granulocytes 0.03 0.00 - 0.07 K/uL    Comment: Performed at [facility], [address] [place], [place] [address]  Protime-INR     Status: Abnormal   Collection Time: 01/31/22  5:23 PM  Result Value Ref Range   Prothrombin Time 21.5 (H) 11.4 - 15.2 seconds   INR 1.9 (H) 0.8 - 1.2    Comment: (NOTE) INR goal varies based on device and disease states. Performed at [facility], [address] [place], [place] [address]   Lactic acid, plasma     Status: None   Collection Time: 01/31/22  5:48 PM  Result Value Ref Range   Lactic Acid, Venous 1.4 0.5 - 1.9 mmol/L    Comment: Performed at [facility], [address] [place], [place] [address]  Culture, blood (Routine x 2)     Status: None   Collection Time: 01/31/22  5:48 PM   Specimen: BLOOD  Result Value Ref Range   Specimen Description BLOOD BLOOD RIGHT WRIST    Special Requests      BOTTLES DRAWN AEROBIC AND ANAEROBIC Blood Culture results may not be optimal due to an excessive volume of blood received in culture bottles   Culture      NO GROWTH 5 DAYS Performed at [facility], [address] [place], [place] [address]    Report Status 02/05/2022 FINAL   Culture, blood (Routine x 2)     Status: Abnormal   Collection Time: 01/31/22  5:48 PM   Specimen: BLOOD  Result Value Ref Range   Specimen Description      BLOOD BLOOD RIGHT FOREARM Performed at [facility], [address], [place], [place] [address]    Special Requests      BOTTLES DRAWN AEROBIC AND ANAEROBIC Blood Culture results may not be optimal due to an excessive volume of blood received in culture bottles Performed at [facility], [address], [place], [place] [address]  Culture  Setup Time      GRAM NEGATIVE RODS GRAM POSITIVE RODS AEROBIC BOTTLE ONLY CRITICAL RESULT CALLED TO, READ BACK BY AND VERIFIED WITH: STEVEN JONES @2350  ON 02/01/22 SKL Performed at [facility], [place] [address], [place], [place] [address]    Culture ESCHERICHIA COLI (A)    Report Status 02/04/2022 FINAL    Organism ID, Bacteria ESCHERICHIA COLI       Susceptibility   Escherichia coli - MIC*    AMPICILLIN >=32 RESISTANT Resistant     CEFAZOLIN <=4 SENSITIVE Sensitive     CEFEPIME <=0.12 SENSITIVE Sensitive     CEFTAZIDIME <=1 SENSITIVE Sensitive     CEFTRIAXONE <=0.25 SENSITIVE Sensitive     CIPROFLOXACIN <=0.25  SENSITIVE Sensitive     GENTAMICIN <=1 SENSITIVE Sensitive     IMIPENEM <=0.25 SENSITIVE Sensitive     TRIMETH/SULFA <=20 SENSITIVE Sensitive     AMPICILLIN/SULBACTAM 16 INTERMEDIATE Intermediate     PIP/TAZO <=4 SENSITIVE Sensitive     * ESCHERICHIA COLI  Urinalysis, Routine w reflex microscopic -Urine, Clean Catch     Status: Abnormal   Collection Time: 01/31/22  5:48 PM  Result Value Ref Range   Color, Urine YELLOW (A) YELLOW   APPearance TURBID (A) CLEAR   Specific Gravity, Urine 1.014 1.005 - 1.030   pH 5.0 5.0 - 8.0   Glucose, UA NEGATIVE NEGATIVE mg/dL   Hgb urine dipstick MODERATE (A) NEGATIVE   Bilirubin Urine NEGATIVE NEGATIVE   Ketones, ur NEGATIVE NEGATIVE mg/dL   Protein, ur >=300 (A) NEGATIVE mg/dL   Nitrite NEGATIVE NEGATIVE   Leukocytes,Ua LARGE (A) NEGATIVE   RBC / HPF >50 0 - 5 RBC/hpf   WBC, UA >50 0 - 5 WBC/hpf   Bacteria, UA MANY (A) NONE SEEN   Squamous Epithelial / HPF 0-5 0 - 5 /HPF   WBC Clumps PRESENT    Mucus PRESENT     Comment: Performed at [facility], [address] [place], [place] [address]  Resp panel by RT-PCR (RSV, Flu A&B, Covid) Anterior Nasal Swab     Status: None   Collection Time: 01/31/22  5:48 PM   Specimen: Anterior Nasal Swab  Result Value Ref Range   SARS Coronavirus 2 by RT PCR NEGATIVE NEGATIVE    Comment: (NOTE) SARS-CoV-2 target nucleic acids are NOT DETECTED.  The SARS-CoV-2 RNA is generally detectable in upper respiratory specimens during the acute phase of infection. The lowest concentration of SARS-CoV-2 viral copies this assay can detect is 138 copies/mL. A negative result does not preclude SARS-Cov-2 infection and should not be used as the sole basis for treatment or other patient management decisions. A negative result may occur with  improper specimen collection/handling, submission of specimen other than nasopharyngeal swab, presence of viral mutation(s) within the areas targeted by this assay, and  inadequate number of viral copies(<138 copies/mL). A negative result must be combined with clinical observations, patient history, and epidemiological information. The expected result is Negative.  Fact Sheet for Patients:  EntrepreneurPulse.com.au  Fact Sheet for Healthcare Providers:  IncredibleEmployment.be  This test is no t yet approved or cleared by the [place] FDA and  has been authorized for detection and/or diagnosis of SARS-CoV-2 by FDA under an Emergency Use Authorization (EUA). This EUA will remain  in effect (meaning this test can be used) for the duration of the COVID-19 declaration under Section 564(b)(1) of the Act, 21 U.S.C.section 360bbb-3(b)(1), unless the authorization is terminated  or  revoked sooner.       Influenza A by PCR NEGATIVE NEGATIVE   Influenza B by PCR NEGATIVE NEGATIVE    Comment: (NOTE) The Xpert Xpress SARS-CoV-2/FLU/RSV plus assay is intended as an aid in the diagnosis of influenza from Nasopharyngeal swab specimens and should not be used as a sole basis for treatment. Nasal washings and aspirates are unacceptable for Xpert Xpress SARS-CoV-2/FLU/RSV testing.  Fact Sheet for Patients: EntrepreneurPulse.com.au  Fact Sheet for Healthcare Providers: IncredibleEmployment.be  This test is not yet approved or cleared by the [place] FDA and has been authorized for detection and/or diagnosis of SARS-CoV-2 by FDA under an Emergency Use Authorization (EUA). This EUA will remain in effect (meaning this test can be used) for the duration of the COVID-19 declaration under Section 564(b)(1) of the Act, 21 U.S.C. section 360bbb-3(b)(1), unless the authorization is terminated or revoked.     Resp Syncytial Virus by PCR NEGATIVE NEGATIVE    Comment: (NOTE) Fact Sheet for Patients: EntrepreneurPulse.com.au  Fact Sheet for Healthcare  Providers: IncredibleEmployment.be  This test is not yet approved or cleared by the [place] FDA and has been authorized for detection and/or diagnosis of SARS-CoV-2 by FDA under an Emergency Use Authorization (EUA). This EUA will remain in effect (meaning this test can be used) for the duration of the COVID-19 declaration under Section 564(b)(1) of the Act, 21 U.S.C. section 360bbb-3(b)(1), unless the authorization is terminated or revoked.  Performed at [facility], [address] [place], [place] [address]   Blood Culture ID Panel (Reflexed)     Status: Abnormal   Collection Time: 01/31/22  5:48 PM  Result Value Ref Range   Enterococcus faecalis NOT DETECTED NOT DETECTED   Enterococcus Faecium NOT DETECTED NOT DETECTED   Listeria monocytogenes NOT DETECTED NOT DETECTED   Staphylococcus species NOT DETECTED NOT DETECTED   Staphylococcus aureus (BCID) NOT DETECTED NOT DETECTED   Staphylococcus epidermidis NOT DETECTED NOT DETECTED   Staphylococcus lugdunensis NOT DETECTED NOT DETECTED   Streptococcus species NOT DETECTED NOT DETECTED   Streptococcus agalactiae NOT DETECTED NOT DETECTED   Streptococcus pneumoniae NOT DETECTED NOT DETECTED   Streptococcus pyogenes NOT DETECTED NOT DETECTED   A.calcoaceticus-baumannii NOT DETECTED NOT DETECTED   Bacteroides fragilis NOT DETECTED NOT DETECTED   Enterobacterales DETECTED (A) NOT DETECTED    Comment: Enterobacterales represent a large order of gram negative bacteria, not a single organism. CRITICAL RESULT CALLED TO, READ BACK BY AND VERIFIED WITH: STEVEN JONES @2350  ON 02/01/22 SKL    Enterobacter cloacae complex NOT DETECTED NOT DETECTED   Escherichia coli DETECTED (A) NOT DETECTED    Comment: CRITICAL RESULT CALLED TO, READ BACK BY AND VERIFIED WITH: STEVEN JONES @2350  ON 02/01/22 SKL    Klebsiella aerogenes NOT DETECTED NOT DETECTED   Klebsiella oxytoca NOT DETECTED NOT DETECTED   Klebsiella  pneumoniae NOT DETECTED NOT DETECTED   Proteus species NOT DETECTED NOT DETECTED   Salmonella species NOT DETECTED NOT DETECTED   Serratia marcescens NOT DETECTED NOT DETECTED   Haemophilus influenzae NOT DETECTED NOT DETECTED   Neisseria meningitidis NOT DETECTED NOT DETECTED   Pseudomonas aeruginosa NOT DETECTED NOT DETECTED   Stenotrophomonas maltophilia NOT DETECTED NOT DETECTED   Candida albicans NOT DETECTED NOT DETECTED   Candida auris NOT DETECTED NOT DETECTED   Candida glabrata NOT DETECTED NOT DETECTED   Candida krusei NOT DETECTED NOT DETECTED   Candida parapsilosis NOT DETECTED NOT DETECTED   Candida tropicalis NOT DETECTED NOT DETECTED  Cryptococcus neoformans/gattii NOT DETECTED NOT DETECTED   CTX-M ESBL NOT DETECTED NOT DETECTED   Carbapenem resistance IMP NOT DETECTED NOT DETECTED   Carbapenem resistance KPC NOT DETECTED NOT DETECTED   Carbapenem resistance NDM NOT DETECTED NOT DETECTED   Carbapenem resist OXA 48 LIKE NOT DETECTED NOT DETECTED   Carbapenem resistance VIM NOT DETECTED NOT DETECTED    Comment: Performed at [facility], [address] [place], [place] [id number]  Basic metabolic panel     Status: Abnormal   Collection Time: 02/02/22 10:06 AM  Result Value Ref Range   Sodium 129 (L) 135 - 145 mmol/L   Potassium 3.5 3.5 - 5.1 mmol/L   Chloride 90 (L) 98 - 111 mmol/L   CO2 21 (L) 22 - 32 mmol/L   Glucose, Bld 107 (H) 70 - 99 mg/dL    Comment: Glucose reference range applies only to samples taken after fasting for at least 8 hours.   BUN 75 (H) 8 - 23 mg/dL   Creatinine, Ser 6.49 (H) 0.61 - 1.24 mg/dL   Calcium 8.6 (L) 8.9 - 10.3 mg/dL   GFR, Estimated 8 (L) >60 mL/min    Comment: (NOTE) Calculated using the CKD-EPI Creatinine Equation (2021)    Anion gap 18 (H) 5 - 15    Comment: Performed at [facility], ., [place], [place] [address]  CBC with Differential     Status: Abnormal   Collection Time: 02/02/22 10:06 AM   Result Value Ref Range   WBC 6.3 4.0 - 10.5 K/uL   RBC 2.67 (L) 4.22 - 5.81 MIL/uL   Hemoglobin 9.3 (L) 13.0 - 17.0 g/dL   HCT 26.9 (L) 39.0 - 52.0 %   MCV 100.7 (H) 80.0 - 100.0 fL   MCH 34.8 (H) 26.0 - 34.0 pg   MCHC 34.6 30.0 - 36.0 g/dL   RDW 13.4 11.5 - 15.5 %   Platelets 128 (L) 150 - 400 K/uL   nRBC 0.0 0.0 - 0.2 %   Neutrophils Relative % 81 %   Neutro Abs 5.1 1.7 - 7.7 K/uL   Lymphocytes Relative 5 %   Lymphs Abs 0.3 (L) 0.7 - 4.0 K/uL   Monocytes Relative 12 %   Monocytes Absolute 0.8 0.1 - 1.0 K/uL   Eosinophils Relative 1 %   Eosinophils Absolute 0.1 0.0 - 0.5 K/uL   Basophils Relative 0 %   Basophils Absolute 0.0 0.0 - 0.1 K/uL   Immature Granulocytes 1 %   Abs Immature Granulocytes 0.03 0.00 - 0.07 K/uL    Comment: Performed at [facility], [address] [place], [place] [address]  Culture, blood (routine x 2)     Status: None   Collection Time: 02/02/22 10:06 AM   Specimen: BLOOD RIGHT ARM  Result Value Ref Range   Specimen Description BLOOD RIGHT ARM    Special Requests      BOTTLES DRAWN AEROBIC AND ANAEROBIC Blood Culture adequate volume   Culture      NO GROWTH 5 DAYS Performed at [facility], [address] [place], [place] [address]    Report Status 02/07/2022 FINAL   Culture, blood (routine x 2)     Status: None   Collection Time: 02/02/22 10:06 AM   Specimen: Right Antecubital; Blood  Result Value Ref Range   Specimen Description RIGHT ANTECUBITAL    Special Requests      BOTTLES DRAWN AEROBIC AND ANAEROBIC Blood Culture adequate volume  Culture      NO GROWTH 5 DAYS Performed at [facility], [address] [place], [place] [address]    Report Status 02/07/2022 FINAL   Lactic acid, plasma     Status: None   Collection Time: 02/02/22 10:06 AM  Result Value Ref Range   Lactic Acid, Venous 1.7 0.5 - 1.9 mmol/L    Comment: Performed at [facility], [address] [place], [place] [address]   Protime-INR     Status: Abnormal   Collection Time: 02/02/22 10:06 AM  Result Value Ref Range   Prothrombin Time 20.2 (H) 11.4 - 15.2 seconds   INR 1.7 (H) 0.8 - 1.2    Comment: (NOTE) INR goal varies based on device and disease states. Performed at [facility], [address] [place], [place] [address]   APTT     Status: None   Collection Time: 02/02/22 10:06 AM  Result Value Ref Range   aPTT 36 24 - 36 seconds    Comment: Performed at [facility], [name] [place], [place] [address]  Urine Culture (for pregnant, neutropenic or urologic patients or patients with an indwelling urinary catheter)     Status: None   Collection Time: 02/02/22 11:46 AM   Specimen: Urine, Clean Catch  Result Value Ref Range   Specimen Description      URINE, CLEAN CATCH Performed at [facility], [address], [place], [place] [address]    Special Requests      NONE Performed at [facility], [address], [place], [place] [address]    Culture      NO GROWTH Performed at [facility], [place] [address], [place], [place] [address]    Report Status 02/03/2022 FINAL   MRSA Next Gen by PCR, Nasal     Status: None   Collection Time: 02/02/22  8:45 PM   Specimen: Nasal Mucosa; Nasal Swab  Result Value Ref Range   MRSA by PCR Next Gen NOT DETECTED NOT DETECTED    Comment: (NOTE) The GeneXpert MRSA Assay (FDA approved for NASAL specimens only), is one component of a comprehensive MRSA colonization surveillance program. It is not intended to diagnose MRSA infection nor to guide or monitor treatment for MRSA infections. Test performance is not FDA approved in patients less than 26 years old. Performed at [facility], [address] [place], [place] [address]   Hepatitis B surface antigen     Status: None   Collection Time: 02/03/22  4:59 AM  Result Value Ref Range   Hepatitis B Surface Ag NON REACTIVE NON REACTIVE    Comment:  Performed at [facility] [address], [place], [place] [address]  Hepatitis B surface antibody,quantitative     Status: Abnormal   Collection Time: 02/03/22  4:59 AM  Result Value Ref Range   Hep B S AB Quant (Post) <3.1 (L) Immunity>9.9 mIU/mL    Comment: (NOTE)  Status of Immunity                     Anti-HBs Level  ------------------                     -------------- Inconsistent with Immunity                   0.0 - 9.9 Consistent with Immunity                          >  9.9 Performed At: [facility] [place], [place] [id number] Rush Farmer MD [id number]   Hepatitis B e antibody     Status: None   Collection Time: 02/03/22  4:59 AM  Result Value Ref Range   Hep B E Ab Negative Negative    Comment: (NOTE) Performed At: [facility] [place], [place] [id number] Rush Farmer MD [id number]   Basic metabolic panel     Status: Abnormal   Collection Time: 02/03/22  4:59 AM  Result Value Ref Range   Sodium 131 (L) 135 - 145 mmol/L   Potassium 3.1 (L) 3.5 - 5.1 mmol/L   Chloride 91 (L) 98 - 111 mmol/L   CO2 25 22 - 32 mmol/L   Glucose, Bld 95 70 - 99 mg/dL    Comment: Glucose reference range applies only to samples taken after fasting for at least 8 hours.   BUN 91 (H) 8 - 23 mg/dL   Creatinine, Ser 6.64 (H) 0.61 - 1.24 mg/dL   Calcium 8.3 (L) 8.9 - 10.3 mg/dL   GFR, Estimated 8 (L) >60 mL/min    Comment: (NOTE) Calculated using the CKD-EPI Creatinine Equation (2021)    Anion gap 15 5 - 15    Comment: Performed at [facility], [address] [place], [place] [address]  Iron and TIBC     Status: Abnormal   Collection Time: 02/03/22  4:59 AM  Result Value Ref Range   Iron 37 (L) 45 - 182 ug/dL   TIBC 210 (L) 250 - 450 ug/dL   Saturation Ratios 18 17.9 - 39.5 %   UIBC 173 ug/dL    Comment: Performed at [facility], [address], [place], [place] [address]  Folate     Status: None    Collection Time: 02/03/22  4:59 AM  Result Value Ref Range   Folate >40.0 >5.9 ng/mL    Comment: Performed at [facility], [address] [place], [place] [address]  Magnesium     Status: None   Collection Time: 02/03/22  4:59 AM  Result Value Ref Range   Magnesium 2.2 1.7 - 2.4 mg/dL    Comment: Performed at [facility], [address] [place], [place] [address]  Phosphorus     Status: Abnormal   Collection Time: 02/03/22  4:59 AM  Result Value Ref Range   Phosphorus 5.1 (H) 2.5 - 4.6 mg/dL    Comment: Performed at [facility], [address] [place], [place] [address]  Vitamin B12     Status: Abnormal   Collection Time: 02/03/22  8:40 AM  Result Value Ref Range   Vitamin B-12 7,118 (H) 180 - 914 pg/mL    Comment: RESULT CONFIRMED BY AUTOMATED DILUTION (NOTE) This assay is not validated for testing neonatal or myeloproliferative syndrome specimens for Vitamin B12 levels. Performed at [facility], [place] [address] [place], [place] [address]   Miscellaneous [facility] test (send-out)     Status: None   Collection Time: 02/03/22  8:40 AM  Result Value Ref Range   [facility] test code [id number]    [facility] test name VITAMIN D 25 HYDROXY    Source ([facility]) SERUM     Comment: Performed at [facility], [place] [address], [place],  [address]   Misc [facility] result COMMENT     Comment: (NOTE) Test Ordered: [id number] Vitamin D, 25-Hydroxy Vitamin D, 25-Hydroxy  63.2             ng/mL    BN     Reference Range: 30.0-100.0                            Vitamin D deficiency has been defined by the [place] practice guideline as a level of serum 25-OH vitamin D less than 20 ng/mL (1,2). The Endocrine Society went on to further define vitamin D insufficiency as a level between 21 and 29 ng/mL (2). 1. IOM (Institute of Medicine). 2010. Dietary reference   intakes for calcium and D. [place]: The    [employer]. 2. Holick MF, [place], Bischoff-Ferrari HA, et al.   Evaluation, treatment, and prevention of vitamin D   deficiency: an Endocrine Society clinical practice   guideline. JCEM. 2011 Jul; 96(7):1911-30. Performed At: [facility] [place], [place] [id number] Rush Farmer MD [id number]   CBC     Status: Abnormal   Collection Time: 02/03/22  9:29 AM  Result Value Ref Range   WBC 6.1 4.0 - 10.5 K/uL   RBC 2.61 (L) 4.22 - 5.81 MIL/uL   Hemoglobin 9.0 (L) 13.0 - 17.0 g/dL   HCT 25.8 (L) 39.0 - 52.0 %   MCV 98.9 80.0 - 100.0 fL   MCH 34.5 (H) 26.0 - 34.0 pg   MCHC 34.9 30.0 - 36.0 g/dL   RDW 13.4 11.5 - 15.5 %   Platelets 132 (L) 150 - 400 K/uL   nRBC 0.0 0.0 - 0.2 %    Comment: Performed at [facility], [address], [place], [place] [address]    Radiology VAS [place] (AVF,AVG)  Result Date: 03/07/2022 DIALYSIS ACCESS Patient Name:  Adedayo Stoessel.  Date of Exam:   03/05/2022 Medical Rec #: [id number]            Accession #:    [id number] Date of Birth: Sep 16, 1940           Patient Gender: M Patient Age:   85 years Exam Location:  [facility] Procedure:      VAS US DUPLEX DIALYSIS ACCESS (AVF, AVG) Referring Phys: Eulogio Ditch --------------------------------------------------------------------------------  Reason for Exam: Unable to dialyze through AVF/AVG. Access Site: Left Upper Extremity. Access Type: Brachial-cephalic AVF. History: Created 01/04/2022. Comparison Study: 02/04/2022 Performing Technologist: Concha Norway RVT  Examination Guidelines: A complete evaluation includes B-mode imaging, spectral Doppler, color Doppler, and power Doppler as needed of all accessible portions of each vessel. Unilateral testing is considered an integral part of a complete examination. Limited examinations for reoccurring indications may be performed as noted.  Findings:  +--------------------+----------+-----------------+--------+ AVF                 PSV (cm/s)Flow Vol (mL/min)Comments +--------------------+----------+-----------------+--------+ Native artery inflow   285          2613                +--------------------+----------+-----------------+--------+ AVF Anastomosis        566                              +--------------------+----------+-----------------+--------+  +---------------+----------+-------------+----------+--------------------------+ OUTFLOW VEIN   PSV (cm/s)Diameter (cm)Depth (cm)         Describe          +---------------+----------+-------------+----------+--------------------------+ Subclavian vein  387                                                      +---------------+----------+-------------+----------+--------------------------+ Confluence        369                                                      +---------------+----------+-------------+----------+--------------------------+ Prox UA           517        0.36                                          +---------------+----------+-------------+----------+--------------------------+ Mid UA            283                                                      +---------------+----------+-------------+----------+--------------------------+ Dist UA           165                           competing branch and .27cm +---------------+----------+-------------+----------+--------------------------+  +---------------+------------+----------+---------+--------+------------------+                  Diameter  Depth (cm)Branching  PSV      Flow Volume                        (cm)                        (cm/s)      (ml/min)      +---------------+------------+----------+---------+--------+------------------+ Left Rad Art                                     58                      Dis                                                                       +---------------+------------+----------+---------+--------+------------------+ antegrade                                                                +---------------+------------+----------+---------+--------+------------------+  Summary: Patent left brachiocephalic AVF with no evidence of stenosis. There is a small competing branch (,  27cm diam) at the distal upper arm with flow.  *See table(s) above for measurements and observations.  Diagnosing physician: Leotis Pain MD Electronically signed by Leotis Pain MD on 03/07/2022 at 7:24:43 AM.   --------------------------------------------------------------------------------   Final     Assessment/Plan  ESRD (end stage renal disease) ([place]) A duplex performed about 2 weeks ago showed a patent left brachiocephalic AV fistula without significant stenosis.  There was a medium size competing branch present.  This fistula should be a good durable access for some time.  Will recheck this in 6 months with duplex.  Once he is using this completely, he can have his catheter removed.  This will be done as an outpatient.  Hyperlipidemia lipid control important in reducing the progression of atherosclerotic disease. Continue statin therapy    Leotis Pain, MD  03/20/2022 2:21 PM    This note was created with Dragon medical transcription system.  Any errors from dictation are purely unintentional

## 2022-03-22 ENCOUNTER — Telehealth (INDEPENDENT_AMBULATORY_CARE_PROVIDER_SITE_OTHER): Payer: Self-pay

## 2022-03-22 DIAGNOSIS — N186 End stage renal disease: Secondary | ICD-10-CM | POA: Diagnosis not present

## 2022-03-22 DIAGNOSIS — Z992 Dependence on renal dialysis: Secondary | ICD-10-CM | POA: Diagnosis not present

## 2022-03-24 DIAGNOSIS — N186 End stage renal disease: Secondary | ICD-10-CM | POA: Diagnosis not present

## 2022-03-24 DIAGNOSIS — Z992 Dependence on renal dialysis: Secondary | ICD-10-CM | POA: Diagnosis not present

## 2022-03-27 DIAGNOSIS — N186 End stage renal disease: Secondary | ICD-10-CM | POA: Diagnosis not present

## 2022-03-27 DIAGNOSIS — Z992 Dependence on renal dialysis: Secondary | ICD-10-CM | POA: Diagnosis not present

## 2022-03-28 DIAGNOSIS — I4821 Permanent atrial fibrillation: Secondary | ICD-10-CM | POA: Diagnosis not present

## 2022-03-28 DIAGNOSIS — N184 Chronic kidney disease, stage 4 (severe): Secondary | ICD-10-CM | POA: Diagnosis not present

## 2022-03-28 DIAGNOSIS — I2510 Atherosclerotic heart disease of native coronary artery without angina pectoris: Secondary | ICD-10-CM | POA: Diagnosis not present

## 2022-03-28 DIAGNOSIS — I129 Hypertensive chronic kidney disease with stage 1 through stage 4 chronic kidney disease, or unspecified chronic kidney disease: Secondary | ICD-10-CM | POA: Diagnosis not present

## 2022-03-28 DIAGNOSIS — R7309 Other abnormal glucose: Secondary | ICD-10-CM | POA: Diagnosis not present

## 2022-03-29 DIAGNOSIS — N186 End stage renal disease: Secondary | ICD-10-CM | POA: Diagnosis not present

## 2022-03-29 DIAGNOSIS — Z992 Dependence on renal dialysis: Secondary | ICD-10-CM | POA: Diagnosis not present

## 2022-03-31 DIAGNOSIS — Z992 Dependence on renal dialysis: Secondary | ICD-10-CM | POA: Diagnosis not present

## 2022-03-31 DIAGNOSIS — N186 End stage renal disease: Secondary | ICD-10-CM | POA: Diagnosis not present

## 2022-04-01 DIAGNOSIS — Z992 Dependence on renal dialysis: Secondary | ICD-10-CM | POA: Diagnosis not present

## 2022-04-01 DIAGNOSIS — N186 End stage renal disease: Secondary | ICD-10-CM | POA: Diagnosis not present

## 2022-04-03 DIAGNOSIS — N186 End stage renal disease: Secondary | ICD-10-CM | POA: Diagnosis not present

## 2022-04-03 DIAGNOSIS — Z992 Dependence on renal dialysis: Secondary | ICD-10-CM | POA: Diagnosis not present

## 2022-04-04 DIAGNOSIS — I4821 Permanent atrial fibrillation: Secondary | ICD-10-CM | POA: Diagnosis not present

## 2022-04-04 DIAGNOSIS — N185 Chronic kidney disease, stage 5: Secondary | ICD-10-CM | POA: Diagnosis not present

## 2022-04-04 DIAGNOSIS — R7303 Prediabetes: Secondary | ICD-10-CM | POA: Diagnosis not present

## 2022-04-04 DIAGNOSIS — I700 Atherosclerosis of aorta: Secondary | ICD-10-CM | POA: Diagnosis not present

## 2022-04-04 DIAGNOSIS — I2510 Atherosclerotic heart disease of native coronary artery without angina pectoris: Secondary | ICD-10-CM | POA: Diagnosis not present

## 2022-04-04 DIAGNOSIS — I132 Hypertensive heart and chronic kidney disease with heart failure and with stage 5 chronic kidney disease, or end stage renal disease: Secondary | ICD-10-CM | POA: Diagnosis not present

## 2022-04-04 DIAGNOSIS — I5022 Chronic systolic (congestive) heart failure: Secondary | ICD-10-CM | POA: Diagnosis not present

## 2022-04-04 DIAGNOSIS — Z87891 Personal history of nicotine dependence: Secondary | ICD-10-CM | POA: Diagnosis not present

## 2022-04-05 DIAGNOSIS — N186 End stage renal disease: Secondary | ICD-10-CM | POA: Diagnosis not present

## 2022-04-05 DIAGNOSIS — Z992 Dependence on renal dialysis: Secondary | ICD-10-CM | POA: Diagnosis not present

## 2022-04-09 ENCOUNTER — Telehealth (INDEPENDENT_AMBULATORY_CARE_PROVIDER_SITE_OTHER): Payer: Self-pay

## 2022-04-09 DIAGNOSIS — N186 End stage renal disease: Secondary | ICD-10-CM | POA: Diagnosis not present

## 2022-04-09 DIAGNOSIS — Z992 Dependence on renal dialysis: Secondary | ICD-10-CM | POA: Diagnosis not present

## 2022-04-09 NOTE — Telephone Encounter (Signed)
Spoke with the patient's spouse and the patient is scheduled with Dr. Gilda Crease on 04/17/22 with a 1:00 pm arrival time to the [facility] for a permcath removal. Pre-procedure instructions were discussed and will be sent to Mychart.

## 2022-04-10 DIAGNOSIS — Z0100 Encounter for examination of eyes and vision without abnormal findings: Secondary | ICD-10-CM | POA: Diagnosis not present

## 2022-04-12 DIAGNOSIS — N186 End stage renal disease: Secondary | ICD-10-CM | POA: Diagnosis not present

## 2022-04-12 DIAGNOSIS — Z992 Dependence on renal dialysis: Secondary | ICD-10-CM | POA: Diagnosis not present

## 2022-04-14 DIAGNOSIS — Z992 Dependence on renal dialysis: Secondary | ICD-10-CM | POA: Diagnosis not present

## 2022-04-14 DIAGNOSIS — N186 End stage renal disease: Secondary | ICD-10-CM | POA: Diagnosis not present

## 2022-04-17 ENCOUNTER — Encounter
Admission: RE | Disposition: A | Discharge status: Home / Self Care | Payer: Self-pay | Source: Home / Self Care | Attending: Vascular Surgery

## 2022-04-17 ENCOUNTER — Ambulatory Visit
Admission: RE | Admit: 2022-04-17 | Discharge: 2022-04-17 | Disposition: A | Discharge status: Home / Self Care | Payer: Medicare HMO | Attending: Vascular Surgery | Admitting: Vascular Surgery

## 2022-04-17 DIAGNOSIS — Z95828 Presence of other vascular implants and grafts: Secondary | ICD-10-CM

## 2022-04-17 DIAGNOSIS — Z992 Dependence on renal dialysis: Secondary | ICD-10-CM | POA: Diagnosis not present

## 2022-04-17 DIAGNOSIS — Z4901 Encounter for fitting and adjustment of extracorporeal dialysis catheter: Secondary | ICD-10-CM | POA: Diagnosis not present

## 2022-04-17 DIAGNOSIS — Z9889 Other specified postprocedural states: Secondary | ICD-10-CM

## 2022-04-17 DIAGNOSIS — I120 Hypertensive chronic kidney disease with stage 5 chronic kidney disease or end stage renal disease: Secondary | ICD-10-CM | POA: Insufficient documentation

## 2022-04-17 DIAGNOSIS — N186 End stage renal disease: Secondary | ICD-10-CM | POA: Diagnosis not present

## 2022-04-17 DIAGNOSIS — E785 Hyperlipidemia, unspecified: Secondary | ICD-10-CM | POA: Insufficient documentation

## 2022-04-17 HISTORY — PX: DIALYSIS/PERMA CATHETER REMOVAL: CATH118289

## 2022-04-17 SURGERY — DIALYSIS/PERMA CATHETER REMOVAL
Anesthesia: LOCAL

## 2022-04-17 MED ORDER — LIDOCAINE-EPINEPHRINE (PF) 1 %-1:200000 IJ SOLN
INTRAMUSCULAR | Status: DC | PRN
  Administered 2022-04-17: 20 mL via INTRADERMAL

## 2022-04-17 SURGICAL SUPPLY — 4 items
APL PRP STRL LF DISP 70% ISPRP (MISCELLANEOUS) ×2
CHLORAPREP W/TINT 26 (MISCELLANEOUS) IMPLANT
FORCEPS HALSTEAD CVD 5IN STRL (INSTRUMENTS) IMPLANT
TRAY LACERAT/PLASTIC (MISCELLANEOUS) IMPLANT

## 2022-04-17 NOTE — Discharge Instructions (Signed)
Tunneled Catheter Removal, Care After Refer to this sheet in the next few weeks. These instructions provide you with information about caring for yourself after your procedure. Your health care provider may also give you more specific instructions. Your treatment has been planned according to current medical practices, but problems sometimes occur. Call your health care provider if you have any problems or questions after your procedure. What can I expect after the procedure? After the procedure, it is common to have: Some mild redness, swelling, and pain around your catheter site.   Follow these instructions at home: Incision care  Check your removal site  every day for signs of infection. Check for: More redness, swelling, or pain. More fluid or blood. Warmth. Pus or a bad smell. Remove your dressing in 48hrs leave open to air  Activity  Return to your normal activities as told by your health care provider. Ask your health care provider what activities are safe for you. Do not lift anything that is heavier than 10 lb (4.5 kg) for 3 days  You may shower tomorrow  Contact a health care provider if: You have more fluid or blood coming from your removal site You have more redness, swelling, or pain at your incisions or around the area where your catheter was removed Your removal site feel warm to the touch. You feel unusually weak. You feel nauseous.. Get help right away if You have swelling in your arm, shoulder, neck, or face. You develop chest pain. You have difficulty breathing. You feel dizzy or light-headed. You have pus or a bad smell coming from your removal site You have a fever. You develop bleeding from your removal site, and your bleeding does not stop. This information is not intended to replace advice given to you by your health care provider. Make sure you discuss any questions you have with your health care provider. Document Released: 12/05/2011 Document Revised:  08/21/2015 Document Reviewed: 09/13/2014 Elsevier Interactive Patient Education  2017 Elsevier Inc. 

## 2022-04-17 NOTE — Op Note (Signed)
  OPERATIVE NOTE   PROCEDURE: Removal of a right IJ tunneled dialysis catheter  PRE-OPERATIVE DIAGNOSIS: Complication of dialysis catheter, End stage renal disease  POST-OPERATIVE DIAGNOSIS: Same  SURGEON: [name], M.D.  Assistant:  Rolla Plate, NP  ANESTHESIA: Local anesthetic with 1% lidocaine with epinephrine   ESTIMATED BLOOD LOSS: Minimal   FINDING(S): 1. Catheter intact   SPECIMEN(S):  Catheter  INDICATIONS:   Lawrence Perkins. is a 82 y.o. male who presents with no further need for the tunneled dialysis catheter.  The patient has undergone placement of an extremity access which is working and this has been successfully cannulated without difficulty.  therefore is undergoing removal of his tunneled catheter which is no longer needed to avoid septic complications.   DESCRIPTION: After obtaining full informed written consent, the patient was positioned supine. The right IJ catheter and surrounding area is prepped and draped in a sterile fashion. The cuff was localized by palpation and noted to be less than 3 cm from the exit site. After appropriate timeout is called, 1% lidocaine with epinephrine is infiltrated into the surrounding tissues around the cuff. Small transverse incision is created at the exit site with an 11 blade scalpel and the dissection was carried up along the catheter to expose the cuff of the tunneled catheter.  The catheter cuff is then freed from the surrounding attachments and adhesions. Once the catheter has been freed circumferentially it is removed in 1 piece. Light pressure was held at the base of the neck.   Antibiotic ointment and a sterile dressing is applied to the exit site. Patient tolerated procedure well and there were no complications.  COMPLICATIONS: None  CONDITION: Unchanged  [name], M.D. [facility] Office: [phone]  04/17/2022,4:22 PM

## 2022-04-18 ENCOUNTER — Encounter: Payer: Self-pay | Admitting: Vascular Surgery

## 2022-04-19 DIAGNOSIS — Z992 Dependence on renal dialysis: Secondary | ICD-10-CM | POA: Diagnosis not present

## 2022-04-19 DIAGNOSIS — N186 End stage renal disease: Secondary | ICD-10-CM | POA: Diagnosis not present

## 2022-04-21 DIAGNOSIS — Z992 Dependence on renal dialysis: Secondary | ICD-10-CM | POA: Diagnosis not present

## 2022-04-21 DIAGNOSIS — N186 End stage renal disease: Secondary | ICD-10-CM | POA: Diagnosis not present

## 2022-04-23 NOTE — Interval H&P Note (Signed)
History and Physical Interval Note:  04/23/2022 4:04 PM  Lawrence Perkins.  has presented today for surgery, with the diagnosis of Perma Cath Removal l  End Stage Renal.  The various methods of treatment have been discussed with the patient and family. After consideration of risks, benefits and other options for treatment, the patient has consented to  Procedure(s): DIALYSIS/PERMA CATHETER REMOVAL (N/A) as a surgical intervention.  The patient's history has been reviewed, patient examined, no change in status, stable for surgery.  I have reviewed the patient's chart and labs.  Questions were answered to the patient's satisfaction.     [name]

## 2022-04-23 NOTE — H&P (Signed)
MRN : [id number]  Lawrence Perkins. is a 82 y.o. (22-Nov-1940) male who presents with chief complaint of check access.  History of Present Illness:   The patient returns to the office for followup of their dialysis access.   The patient reports the function of the access has been stable. Patient denies difficulty with cannulation. The patient denies increased bleeding time after removing the needles. The patient denies hand pain or other symptoms consistent with steal phenomena.  No significant arm swelling.  The patient denies any complaints from the dialysis center or their nephrologist.  The patient denies redness or swelling at the access site. The patient denies fever or chills at home or while on dialysis.  No recent shortening of the patient's walking distance or new symptoms consistent with claudication.  No history of rest pain symptoms. No new ulcers or wounds of the lower extremities have occurred.  The patient denies amaurosis fugax or recent TIA symptoms. There are no recent neurological changes noted. There is no history of DVT, PE or superficial thrombophlebitis. No recent episodes of angina or shortness of breath documented.     Current Meds  Medication Sig   apixaban (ELIQUIS) 2.5 MG TABS tablet Take 2.5 mg by mouth 2 (two) times daily.   B Complex Vitamins (B-COMPLEX/B-12 PO) Take 1,000 mcg by mouth.   calcitRIOL (ROCALTROL) 0.25 MCG capsule Take 0.25 mcg by mouth daily.   carvedilol (COREG) 3.125 MG tablet Take 3.125 mg by mouth 2 (two) times daily with a meal.    cholecalciferol (VITAMIN D3) 25 MCG (1000 UNIT) tablet Take 1,000 Units by mouth daily.   cyanocobalamin (VITAMIN B12) 1000 MCG tablet Take 2,500 mcg by mouth daily.   Multiple Vitamin (MULTIVITAMIN WITH MINERALS) TABS tablet Take 1 tablet by mouth daily.   rosuvastatin (CRESTOR) 10 MG tablet Take 10 mg by mouth at bedtime.   Saccharomyces boulardii (PROBIOTIC) 250 MG CAPS  Take 1 capsule by mouth in the morning and at bedtime.   senna-docusate (SENOKOT-S) 8.6-50 MG tablet Take 1 tablet by mouth daily as needed.   tamsulosin (FLOMAX) 0.4 MG CAPS capsule Take 1 capsule (0.4 mg total) by mouth daily.   torsemide (DEMADEX) 20 MG tablet Take 2 tablets (40 mg total) by mouth daily.   traZODone (DESYREL) 50 MG tablet Take 50 mg by mouth at bedtime as needed for sleep.    Past Medical History:  Diagnosis Date   Anemia    Aortic atherosclerosis    Arthritis    Atrial fibrillation and flutter    a.) CHA2DS2-VASc = 5 (age x 2, CHF, HTN, aortic plaque). b.) rate/rhythm maintain on oral carvedilol; chronically anticoagulated with dose reduced apixaban   Bilateral inguinal hernia    BPH (benign prostatic hyperplasia)    Cardiac syncope    Cardiomyopathy    a.) TTE 04/23/2016: EF 40%. b.) TTE 09/03/2016: EF 50%. c.) TTE 05/04/2019: EF >55%.   CHF (congestive heart failure)    a.) TTE 04/23/2016: EF 40%; BAE; mild AR/TR, mod MR; G1DD. b.) TTE 09/03/2016: EF 50%; mild LVH; BAE; triv AR/PR, mod MR/TR. c.) TTE 05/04/2019: EF >55%; mod LVH, mild LA enlargement; mild AR/MR/TR   CKD (chronic kidney disease), stage V    Complication of anesthesia    a.) h/o intra/postoperative A.fib   Coronary artery disease    Diverticulosis  Gout    History of 2019 novel coronavirus disease (COVID-19) 11/14/2020   History of colon polyps    Hypercholesteremia    Hypertension    Long term current use of anticoagulant    a.) dose reduced apixaban; dose reduction d/t to age and CKD Dx.   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a.) s/p resection   MGUS (monoclonal gammopathy of unknown significance)    Pleural effusion    Rectal bleeding    Secondary hyperparathyroidism of renal origin     Past Surgical History:  Procedure Laterality Date   ANKLE SURGERY Right 1985   following volleyball injury   APPENDECTOMY     at the of 9 yrs   AV FISTULA PLACEMENT Left 01/04/2022   Procedure: ARTERIOVENOUS  (AV) FISTULA CREATION;  Surgeon: Annice Needy, MD;  Location: [facility];  Service: Vascular;  Laterality: Left;   CAPD INSERTION N/A 08/10/2021   Procedure: LAPAROSCOPIC INSERTION CONTINUOUS AMBULATORY PERITONEAL DIALYSIS  (CAPD) CATHETER;  Surgeon: Leafy Ro, MD;  Location: [facility];  Service: General;  Laterality: N/A;   CAPD REVISION N/A 09/21/2021   Procedure: LAPAROSCOPIC REVISION CONTINUOUS AMBULATORY PERITONEAL DIALYSIS  (CAPD) CATHETER;  Surgeon: Leafy Ro, MD;  Location: [facility];  Service: General;  Laterality: N/A;   CATARACT EXTRACTION Left    COLONOSCOPY     COLONOSCOPY WITH PROPOFOL N/A 01/02/2017   Procedure: COLONOSCOPY WITH PROPOFOL;  Surgeon: Scot Jun, MD;  Location: [facility] ENDOSCOPY;  Service: Endoscopy;  Laterality: N/A;   DIALYSIS/PERMA CATHETER INSERTION N/A 03/27/2021   Procedure: DIALYSIS/PERMA CATHETER INSERTION;  Surgeon: Annice Needy, MD;  Location: [facility];  Service: Cardiovascular;  Laterality: N/A;   DIALYSIS/PERMA CATHETER INSERTION N/A 10/10/2021   Procedure: DIALYSIS/PERMA CATHETER INSERTION;  Surgeon: Renford Dills, MD;  Location: [facility];  Service: Cardiovascular;  Laterality: N/A;   DIALYSIS/PERMA CATHETER REMOVAL N/A 05/08/2021   Procedure: DIALYSIS/PERMA CATHETER REMOVAL;  Surgeon: Annice Needy, MD;  Location: [facility];  Service: Cardiovascular;  Laterality: N/A;   DIALYSIS/PERMA CATHETER REMOVAL N/A 04/17/2022   Procedure: DIALYSIS/PERMA CATHETER REMOVAL;  Surgeon: Renford Dills, MD;  Location: [facility];  Service: Cardiovascular;  Laterality: N/A;   INSERTION OF MESH  08/10/2021   Procedure: INSERTION OF MESH;  Surgeon: Leafy Ro, MD;  Location: [facility];  Service: General;;   JOINT REPLACEMENT     RIGHT TOTAL HIP   KNEE ARTHROSCOPY WITH LATERAL RELEASE Right 04/20/2021   Procedure: Right knee medial retinaculum and lateral release with polyethylene exchange;  Surgeon: Kennedy Bucker, MD;  Location: [facility];  Service: Orthopedics;  Laterality: Right;   TONSILLECTOMY     as a child   TOTAL HIP ARTHROPLASTY  09/2009   Right femoral neck fracture   TOTAL HIP ARTHROPLASTY Left 03/06/2016   Procedure: TOTAL HIP ARTHROPLASTY ANTERIOR APPROACH;  Surgeon: Kennedy Bucker, MD;  Location: [facility];  Service: Orthopedics;  Laterality: Left;   TOTAL KNEE ARTHROPLASTY Right 03/21/2021   Procedure: TOTAL KNEE ARTHROPLASTY;  Surgeon: Kennedy Bucker, MD;  Location: [facility];  Service: Orthopedics;  Laterality: Right;   UMBILICAL HERNIA REPAIR  08/10/2021   Procedure: HERNIA REPAIR UMBILICAL ADULT;  Surgeon: Leafy Ro, MD;  Location: [facility];  Service: General;;    Social History Social History   Tobacco Use   Smoking status: Former    Years: 15    Types: Cigarettes    Quit date: 01/01/1981  Years since quitting: 41.3   Smokeless tobacco: Never  Vaping Use   Vaping Use: Never used  Substance Use Topics   Alcohol use: Not Currently   Drug use: No    Family History Family History  Problem Relation Age of Onset   Cirrhosis Mother    Other Father        Lung Fibrosis   Prostate cancer Neg Hx    Kidney cancer Neg Hx    Bladder Cancer Neg Hx     No Known Allergies   REVIEW OF SYSTEMS (Negative unless checked)  Constitutional: Weight loss  Fever  Chills Cardiac: Chest pain   Chest pressure   Palpitations   Shortness of breath when laying flat   Shortness of breath with exertion. Vascular:  Pain in legs with walking   Pain in legs at rest  History of DVT   Phlebitis   Swelling in legs   Varicose veins   Non-healing ulcers Pulmonary:   Uses home oxygen   Productive cough   Hemoptysis   Wheeze  COPD   Asthma Neurologic:  Dizziness   Seizures   History of stroke   History of TIA  Aphasia   Vissual changes   Weakness or numbness in arm   Weakness or numbness in leg Musculoskeletal:   Joint swelling    Joint pain   Low back pain Hematologic:  Easy bruising  Easy bleeding   Hypercoagulable state   Anemic Gastrointestinal:  Diarrhea   Vomiting  Gastroesophageal reflux/heartburn   Difficulty swallowing. Genitourinary:  Chronic kidney disease   Difficult urination  Frequent urination   Blood in urine Skin:  Rashes   Ulcers  Psychological:  History of anxiety    History of major depression.  Physical Examination  Vitals:   04/17/22 1425 04/17/22 1500  BP: (!) 157/84 139/68  Pulse:  84  Resp: 18 20  Temp: 98 F (36.7 C) 97.9 F (36.6 C)  TempSrc:  Oral  SpO2:  97%   There is no height or weight on file to calculate BMI. Gen: WD/WN, NAD Head: Rains/AT, No temporalis wasting.  Ear/Nose/Throat: Hearing grossly intact, nares w/o erythema or drainage Eyes: PER, EOMI, sclera nonicteric.  Neck: Supple, no gross masses or lesions.  No JVD.  Pulmonary:  Good air movement, no audible wheezing, no use of accessory muscles.  Cardiac: RRR, precordium non-hyperdynamic. Vascular:   Left brachiocephalic AV fistula good thrill good bruit; right IJ tunnel catheter clean dry and intact Vessel Right Left  Radial Palpable Palpable  Brachial Palpable Palpable  Gastrointestinal: soft, non-distended. No guarding/no peritoneal signs.  Musculoskeletal: M/S 5/5 throughout.  No deformity.  Neurologic: CN 2-12 intact. Pain and light touch intact in extremities.  Symmetrical.  Speech is fluent. Motor exam as listed above. Psychiatric: Judgment intact, Mood & affect appropriate for pt's clinical situation. Dermatologic: No rashes or ulcers noted.  No changes consistent with cellulitis.   CBC Lab Results  Component Value Date   WBC 6.1 02/03/2022   HGB 9.0 (L) 02/03/2022   HCT 25.8 (L) 02/03/2022   MCV 98.9 02/03/2022   PLT 132 (L) 02/03/2022    BMET    Component Value Date/Time   NA 131 (L) 02/03/2022 0459   NA 139 02/04/2014 1449   K 3.1 (L) 02/03/2022 0459   K  4.6 02/04/2014 1449   CL 91 (L) 02/03/2022 0459   CL 105 02/04/2014 1449   CO2 25 02/03/2022 0459   CO2 27 02/04/2014 1449  GLUCOSE 95 02/03/2022 0459   GLUCOSE 84 02/04/2014 1449   BUN 91 (H) 02/03/2022 0459   BUN 23 (H) 02/04/2014 1449   CREATININE 6.64 (H) 02/03/2022 0459   CREATININE 1.25 02/04/2014 1449   CALCIUM 8.3 (L) 02/03/2022 0459   CALCIUM 8.7 02/04/2014 1449   GFRNONAA 8 (L) 02/03/2022 0459   GFRNONAA >60 02/04/2014 1449   GFRAA 22 (L) 09/15/2019 0856   GFRAA >60 02/04/2014 1449   CrCl cannot be calculated (Patient's most recent lab result is older than the maximum 21 days allowed.).  COAG Lab Results  Component Value Date   INR 1.7 (H) 02/02/2022   INR 1.9 (H) 01/31/2022   INR 1.4 (H) 03/09/2021    Radiology PERIPHERAL VASCULAR CATHETERIZATION  Result Date: 04/17/2022 See surgical note for result.    Assessment/Plan ESRD (end stage renal disease) (HCC) The patient is currently utilizing his brachiocephalic fistula.  He no longer needs his tunneled catheter and therefore the tunnel catheter will be removed to prevent complications such as sepsis.  The risks and benefits of been reviewed all questions answered patient agrees to proceed..   Hyperlipidemia lipid control important in reducing the progression of atherosclerotic disease. Continue statin therapy   [name], MD  04/23/2022 3:59 PM

## 2022-04-24 DIAGNOSIS — N186 End stage renal disease: Secondary | ICD-10-CM | POA: Diagnosis not present

## 2022-04-24 DIAGNOSIS — Z992 Dependence on renal dialysis: Secondary | ICD-10-CM | POA: Diagnosis not present

## 2022-04-26 DIAGNOSIS — Z992 Dependence on renal dialysis: Secondary | ICD-10-CM | POA: Diagnosis not present

## 2022-04-26 DIAGNOSIS — N186 End stage renal disease: Secondary | ICD-10-CM | POA: Diagnosis not present

## 2022-04-28 DIAGNOSIS — Z992 Dependence on renal dialysis: Secondary | ICD-10-CM | POA: Diagnosis not present

## 2022-04-28 DIAGNOSIS — N186 End stage renal disease: Secondary | ICD-10-CM | POA: Diagnosis not present

## 2022-04-30 DIAGNOSIS — G4700 Insomnia, unspecified: Secondary | ICD-10-CM | POA: Insufficient documentation

## 2022-04-30 DIAGNOSIS — D6869 Other thrombophilia: Secondary | ICD-10-CM | POA: Insufficient documentation

## 2022-04-30 DIAGNOSIS — N2581 Secondary hyperparathyroidism of renal origin: Secondary | ICD-10-CM | POA: Insufficient documentation

## 2022-04-30 DIAGNOSIS — N186 End stage renal disease: Secondary | ICD-10-CM

## 2022-04-30 DIAGNOSIS — D631 Anemia in chronic kidney disease: Secondary | ICD-10-CM | POA: Insufficient documentation

## 2022-04-30 DIAGNOSIS — I679 Cerebrovascular disease, unspecified: Secondary | ICD-10-CM | POA: Insufficient documentation

## 2022-04-30 DIAGNOSIS — I272 Pulmonary hypertension, unspecified: Secondary | ICD-10-CM | POA: Insufficient documentation

## 2022-04-30 DIAGNOSIS — D8481 Immunodeficiency due to conditions classified elsewhere: Secondary | ICD-10-CM | POA: Insufficient documentation

## 2022-05-01 DIAGNOSIS — Z992 Dependence on renal dialysis: Secondary | ICD-10-CM | POA: Diagnosis not present

## 2022-05-01 DIAGNOSIS — N186 End stage renal disease: Secondary | ICD-10-CM | POA: Diagnosis not present

## 2022-05-02 NOTE — Telephone Encounter (Signed)
Error

## 2022-05-03 DIAGNOSIS — Z992 Dependence on renal dialysis: Secondary | ICD-10-CM | POA: Diagnosis not present

## 2022-05-03 DIAGNOSIS — N186 End stage renal disease: Secondary | ICD-10-CM | POA: Diagnosis not present

## 2022-05-05 DIAGNOSIS — Z992 Dependence on renal dialysis: Secondary | ICD-10-CM | POA: Diagnosis not present

## 2022-05-05 DIAGNOSIS — N186 End stage renal disease: Secondary | ICD-10-CM | POA: Diagnosis not present

## 2022-05-08 DIAGNOSIS — Z992 Dependence on renal dialysis: Secondary | ICD-10-CM | POA: Diagnosis not present

## 2022-05-08 DIAGNOSIS — N186 End stage renal disease: Secondary | ICD-10-CM | POA: Diagnosis not present

## 2022-05-10 DIAGNOSIS — Z992 Dependence on renal dialysis: Secondary | ICD-10-CM | POA: Diagnosis not present

## 2022-05-10 DIAGNOSIS — N186 End stage renal disease: Secondary | ICD-10-CM | POA: Diagnosis not present

## 2022-05-12 DIAGNOSIS — N186 End stage renal disease: Secondary | ICD-10-CM | POA: Diagnosis not present

## 2022-05-12 DIAGNOSIS — Z992 Dependence on renal dialysis: Secondary | ICD-10-CM | POA: Diagnosis not present

## 2022-05-15 DIAGNOSIS — N186 End stage renal disease: Secondary | ICD-10-CM | POA: Diagnosis not present

## 2022-05-15 DIAGNOSIS — Z992 Dependence on renal dialysis: Secondary | ICD-10-CM | POA: Diagnosis not present

## 2022-05-17 ENCOUNTER — Ambulatory Visit (INDEPENDENT_AMBULATORY_CARE_PROVIDER_SITE_OTHER): Payer: Medicare HMO | Admitting: Nurse Practitioner

## 2022-05-17 ENCOUNTER — Ambulatory Visit (INDEPENDENT_AMBULATORY_CARE_PROVIDER_SITE_OTHER): Payer: Medicare HMO

## 2022-05-17 VITALS — BP 123/67 | HR 68 | Resp 18 | Ht 70.0 in | Wt 173.8 lb

## 2022-05-17 DIAGNOSIS — Z992 Dependence on renal dialysis: Secondary | ICD-10-CM | POA: Diagnosis not present

## 2022-05-17 DIAGNOSIS — N186 End stage renal disease: Secondary | ICD-10-CM

## 2022-05-17 DIAGNOSIS — E785 Hyperlipidemia, unspecified: Secondary | ICD-10-CM

## 2022-05-18 ENCOUNTER — Encounter (INDEPENDENT_AMBULATORY_CARE_PROVIDER_SITE_OTHER): Payer: Self-pay | Admitting: Nurse Practitioner

## 2022-05-18 NOTE — Progress Notes (Signed)
Subjective:    Patient ID: Lawrence Perkins., male    DOB: [birth year], 82 y.o.   MRN: [id number] Chief Complaint  Patient presents with   Follow-up    The patient returns to the office for followup status post intervention of the dialysis access left brachiocephalic fistula   The patient is experiencing increased bleeding times following decannulation and increased recirculation with diminished efficiency of their dialysis. The patient denies an increase in arm swelling. At the present time the patient denies hand pain.  No recent shortening of the patient's walking distance or new symptoms consistent with claudication.  No history of rest pain symptoms. No new ulcers or wounds of the lower extremities have occurred.  The patient denies amaurosis fugax or recent TIA symptoms. There are no recent neurological changes noted. There is no history of DVT, PE or superficial thrombophlebitis. No recent episodes of angina or shortness of breath documented.   Duplex ultrasound of the AV access shows a patent access.  The previously noted stenosis is not improved compared to last study.  Flow volume today is 642 cc/min (previous flow volume was 2613 cc/min)    Review of Systems  Hematological:  Bruises/bleeds easily.  All other systems reviewed and are negative.      Objective:   Physical Exam Vitals reviewed.  HENT:     Head: Normocephalic.  Cardiovascular:     Rate and Rhythm: Normal rate.     Pulses:          Radial pulses are 2+ on the left side.     Arteriovenous access: Left arteriovenous access is present.    Comments: Good thrill but weak bruit Pulmonary:     Effort: Pulmonary effort is normal.  Skin:    General: Skin is warm and dry.  Neurological:     Mental Status: He is alert and oriented to person, place, and time.  Psychiatric:        Mood and Affect: Mood normal.        Behavior: Behavior normal.        Thought Content: Thought content normal.         Judgment: Judgment normal.     BP 123/67 (BP Location: Right Arm)   Pulse 68   Resp 18   Ht 5\' 10"  (1.778 m)   Wt 173 lb 12.8 oz (78.8 kg)   BMI 24.94 kg/m   Past Medical History:  Diagnosis Date   Anemia    Aortic atherosclerosis (HCC)    Arthritis    Atrial fibrillation and flutter (HCC)    a.) CHA2DS2-VASc = 5 (age x 2, CHF, HTN, aortic plaque). b.) rate/rhythm maintain on oral carvedilol; chronically anticoagulated with dose reduced apixaban   Bilateral inguinal hernia    BPH (benign prostatic hyperplasia)    Cardiac syncope    Cardiomyopathy (HCC)    a.) TTE 04/23/2016: EF 40%. b.) TTE 09/03/2016: EF 50%. c.) TTE 05/04/2019: EF >55%.   CHF (congestive heart failure) (HCC)    a.) TTE 04/23/2016: EF 40%; BAE; mild AR/TR, mod MR; G1DD. b.) TTE 09/03/2016: EF 50%; mild LVH; BAE; triv AR/PR, mod MR/TR. c.) TTE 05/04/2019: EF >55%; mod LVH, mild LA enlargement; mild AR/MR/TR   CKD (chronic kidney disease), stage V (HCC)    Complication of anesthesia    a.) h/o intra/postoperative A.fib   Coronary artery disease    Diverticulosis    Gout    History of 2019 novel coronavirus disease (COVID-19) 11/14/2020  History of colon polyps    Hypercholesteremia    Hypertension    Long term current use of anticoagulant    a.) dose reduced apixaban; dose reduction d/t to age and CKD Dx.   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HCC)    a.) s/p resection   MGUS (monoclonal gammopathy of unknown significance)    Pleural effusion    Rectal bleeding    Secondary hyperparathyroidism of renal origin (HCC)     Social History   Socioeconomic History   Marital status: Married    Spouse name: Eber Jones   Number of children: Not on file   Years of education: Not on file   Highest education level: Not on file  Occupational History   Occupation: Retired  Tobacco Use   Smoking status: Former    Years: 15    Types: Cigarettes    Quit date: 01/01/1981    Years since quitting: 41.4   Smokeless tobacco: Never   Vaping Use   Vaping Use: Never used  Substance and Sexual Activity   Alcohol use: Not Currently   Drug use: No   Sexual activity: Yes    Birth control/protection: None  Other Topics Concern   Not on file  Social History Narrative   Regular exercise: Yes   Social Determinants of Health   Financial Resource Strain: Not on file  Food Insecurity: No Food Insecurity (02/02/2022)   Hunger Vital Sign    Worried About Running Out of Food in the Last Year: Never true    Ran Out of Food in the Last Year: Never true  Transportation Needs: No Transportation Needs (02/02/2022)   PRAPARE - Administrator, Civil Service (Medical): No    Lack of Transportation (Non-Medical): No  Physical Activity: Not on file  Stress: Not on file  Social Connections: Not on file  Intimate Partner Violence: Not At Risk (02/02/2022)   Humiliation, Afraid, Rape, and Kick questionnaire    Fear of Current or Ex-Partner: No    Emotionally Abused: No    Physically Abused: No    Sexually Abused: No    Past Surgical History:  Procedure Laterality Date   ANKLE SURGERY Right 1985   following volleyball injury   APPENDECTOMY     at the of 9 yrs   AV FISTULA PLACEMENT Left 01/04/2022   Procedure: ARTERIOVENOUS (AV) FISTULA CREATION;  Surgeon: Annice Needy, MD;  Location: [facility];  Service: Vascular;  Laterality: Left;   CAPD INSERTION N/A 08/10/2021   Procedure: LAPAROSCOPIC INSERTION CONTINUOUS AMBULATORY PERITONEAL DIALYSIS  (CAPD) CATHETER;  Surgeon: Leafy Ro, MD;  Location: [facility];  Service: General;  Laterality: N/A;   CAPD REVISION N/A 09/21/2021   Procedure: LAPAROSCOPIC REVISION CONTINUOUS AMBULATORY PERITONEAL DIALYSIS  (CAPD) CATHETER;  Surgeon: Leafy Ro, MD;  Location: [facility];  Service: General;  Laterality: N/A;   CATARACT EXTRACTION Left    COLONOSCOPY     COLONOSCOPY WITH PROPOFOL N/A 01/02/2017   Procedure: COLONOSCOPY WITH PROPOFOL;  Surgeon: Scot Jun, MD;   Location[facility] END[facility]COPY;  Service: Endoscopy;  Laterality: N/A;   DIALYSIS/PERMA CATHETER INSERTION N/A 03/27/2021   Procedure: DIALYSIS/PERMA CATHETER INSERTION;  Surgeon: Annice Needy, MD;  Location: [facility];  Service: Cardiovascular;  Laterality: N/A;   DIALYSIS/PERMA CATHETER INSERTION N/A 10/10/2021   Procedure: DIALYSIS/PERMA CATHETER INSERTION;  Surgeon: Renford Dills, MD;  Location: [facility];  Service: Cardiovascular;  Laterality: N/A;   DIALYSIS/PERMA CATHETER REMOVAL N/A  05/08/2021   Procedure: DIALYSIS/PERMA CATHETER REMOVAL;  Surgeon: Annice Needy, MD;  Location: [facility];  Service: Cardiovascular;  Laterality: N/A;   DIALYSIS/PERMA CATHETER REMOVAL N/A 04/17/2022   Procedure: DIALYSIS/PERMA CATHETER REMOVAL;  Surgeon: Renford Dills, MD;  Location: [facility];  Service: Cardiovascular;  Laterality: N/A;   INSERTION OF MESH  08/10/2021   Procedure: INSERTION OF MESH;  Surgeon: Leafy Ro, MD;  Location: [facility];  Service: General;;   JOINT REPLACEMENT     RIGHT TOTAL HIP   KNEE ARTHROSCOPY WITH LATERAL RELEASE Right 04/20/2021   Procedure: Right knee medial retinaculum and lateral release with polyethylene exchange;  Surgeon: Kennedy Bucker, MD;  Location: [facility];  Service: Orthopedics;  Laterality: Right;   TONSILLECTOMY     as a child   TOTAL HIP ARTHROPLASTY  09/2009   Right femoral neck fracture   TOTAL HIP ARTHROPLASTY Left 03/06/2016   Procedure: TOTAL HIP ARTHROPLASTY ANTERIOR APPROACH;  Surgeon: Kennedy Bucker, MD;  Location: [facility];  Service: Orthopedics;  Laterality: Left;   TOTAL KNEE ARTHROPLASTY Right 03/21/2021   Procedure: TOTAL KNEE ARTHROPLASTY;  Surgeon: Kennedy Bucker, MD;  Location: [facility];  Service: Orthopedics;  Laterality: Right;   UMBILICAL HERNIA REPAIR  08/10/2021   Procedure: HERNIA REPAIR UMBILICAL ADULT;  Surgeon: Leafy Ro, MD;  Location: [facility];  Service: General;;    Family  History  Problem Relation Age of Onset   Cirrhosis Mother    Other Father        Lung Fibrosis   Prostate cancer Neg Hx    Kidney cancer Neg Hx    Bladder Cancer Neg Hx     No Known Allergies     Latest Ref Rng & Units 02/03/2022    9:29 AM 02/02/2022   10:06 AM 01/31/2022    5:23 PM  CBC  WBC 4.0 - 10.5 K/uL 6.1  6.3  7.4   Hemoglobin 13.0 - 17.0 g/dL 9.0  9.3  16.1   Hematocrit 39.0 - 52.0 % 25.8  26.9  30.8   Platelets 150 - 400 K/uL 132  128  139       CMP     Component Value Date/Time   NA 131 (L) 02/03/2022 0459   NA 139 02/04/2014 1449   K 3.1 (L) 02/03/2022 0459   K 4.6 02/04/2014 1449   CL 91 (L) 02/03/2022 0459   CL 105 02/04/2014 1449   CO2 25 02/03/2022 0459   CO2 27 02/04/2014 1449   GLUCOSE 95 02/03/2022 0459   GLUCOSE 84 02/04/2014 1449   BUN 91 (H) 02/03/2022 0459   BUN 23 (H) 02/04/2014 1449   CREATININE 6.64 (H) 02/03/2022 0459   CREATININE 1.25 02/04/2014 1449   CALCIUM 8.3 (L) 02/03/2022 0459   CALCIUM 8.7 02/04/2014 1449   PROT 7.4 01/31/2022 1723   ALBUMIN 3.8 01/31/2022 1723   AST 14 (L) 01/31/2022 1723   ALT 12 01/31/2022 1723   ALKPHOS 77 01/31/2022 1723   BILITOT 1.4 (H) 01/31/2022 1723   GFRNONAA 8 (L) 02/03/2022 0459   GFRNONAA >60 02/04/2014 1449   GFRAA 22 (L) 09/15/2019 0856   GFRAA >60 02/04/2014 1449     No results found.     Assessment & Plan:   1. ESRD (end stage renal disease) (HCC) Recommend:  The patient is experiencing increasing problems with their dialysis access.  Patient should have a fistulagram with the intention for intervention.  The intention  for intervention is to restore appropriate flow and prevent thrombosis and possible loss of the access.  As well as improve the quality of dialysis therapy.  The risks, benefits and alternative therapies were reviewed in detail with the patient.  All questions were answered.  The patient agrees to proceed with angio/intervention.    The patient will follow up with  me in the office after the procedure.   2. Hyperlipidemia, unspecified hyperlipidemia type Continue statin as ordered and reviewed, no changes at this time   Current Outpatient Medications on File Prior to Visit  Medication Sig Dispense Refill   apixaban (ELIQUIS) 2.5 MG TABS tablet Take 2.5 mg by mouth 2 (two) times daily.     B Complex Vitamins (B-COMPLEX/B-12 PO) Take 1,000 mcg by mouth.     calcitRIOL (ROCALTROL) 0.25 MCG capsule Take 0.25 mcg by mouth daily.     carvedilol (COREG) 3.125 MG tablet Take 3.125 mg by mouth 2 (two) times daily with a meal.      cholecalciferol (VITAMIN D3) 25 MCG (1000 UNIT) tablet Take 1,000 Units by mouth daily.     cyanocobalamin (VITAMIN B12) 1000 MCG tablet Take 2,500 mcg by mouth daily.     Multiple Vitamin (MULTIVITAMIN WITH MINERALS) TABS tablet Take 1 tablet by mouth daily.     rosuvastatin (CRESTOR) 10 MG tablet Take 10 mg by mouth at bedtime.     Saccharomyces boulardii (PROBIOTIC) 250 MG CAPS Take 1 capsule by mouth in the morning and at bedtime. 12 capsule 0   tamsulosin (FLOMAX) 0.4 MG CAPS capsule Take 1 capsule (0.4 mg total) by mouth daily. 90 capsule 3   torsemide (DEMADEX) 20 MG tablet Take 2 tablets (40 mg total) by mouth daily. 60 tablet 0   traZODone (DESYREL) 50 MG tablet Take 50 mg by mouth at bedtime as needed for sleep.     No current facility-administered medications on file prior to visit.    There are no Patient Instructions on file for this visit. No follow-ups on file.   Georgiana Spinner, NP

## 2022-05-18 NOTE — H&P (View-Only) (Signed)
 Subjective:    Patient ID: Lawrence R Oleary Jr., male    DOB: 03/10/1940, 81 y.o.   MRN: [id number] Chief Complaint  Patient presents with   Follow-up    The patient returns to the office for followup status post intervention of the dialysis access left brachiocephalic fistula   The patient is experiencing increased bleeding times following decannulation and increased recirculation with diminished efficiency of their dialysis. The patient denies an increase in arm swelling. At the present time the patient denies hand pain.  No recent shortening of the patient's walking distance or new symptoms consistent with claudication.  No history of rest pain symptoms. No new ulcers or wounds of the lower extremities have occurred.  The patient denies amaurosis fugax or recent TIA symptoms. There are no recent neurological changes noted. There is no history of DVT, PE or superficial thrombophlebitis. No recent episodes of angina or shortness of breath documented.   Duplex ultrasound of the AV access shows a patent access.  The previously noted stenosis is not improved compared to last study.  Flow volume today is 642 cc/min (previous flow volume was 2613 cc/min)    Review of Systems  Hematological:  Bruises/bleeds easily.  All other systems reviewed and are negative.      Objective:   Physical Exam Vitals reviewed.  HENT:     Head: Normocephalic.  Cardiovascular:     Rate and Rhythm: Normal rate.     Pulses:          Radial pulses are 2+ on the left side.     Arteriovenous access: Left arteriovenous access is present.    Comments: Good thrill but weak bruit Pulmonary:     Effort: Pulmonary effort is normal.  Skin:    General: Skin is warm and dry.  Neurological:     Mental Status: He is alert and oriented to person, place, and time.  Psychiatric:        Mood and Affect: Mood normal.        Behavior: Behavior normal.        Thought Content: Thought content normal.         Judgment: Judgment normal.     BP 123/67 (BP Location: Right Arm)   Pulse 68   Resp 18   Ht 5' 10" (1.778 m)   Wt 173 lb 12.8 oz (78.8 kg)   BMI 24.94 kg/m   Past Medical History:  Diagnosis Date   Anemia    Aortic atherosclerosis (HCC)    Arthritis    Atrial fibrillation and flutter (HCC)    a.) CHA2DS2-VASc = 5 (age x 2, CHF, HTN, aortic plaque). b.) rate/rhythm maintain on oral carvedilol; chronically anticoagulated with dose reduced apixaban   Bilateral inguinal hernia    BPH (benign prostatic hyperplasia)    Cardiac syncope    Cardiomyopathy (HCC)    a.) TTE 04/23/2016: EF 40%. b.) TTE 09/03/2016: EF 50%. c.) TTE 05/04/2019: EF >55%.   CHF (congestive heart failure) (HCC)    a.) TTE 04/23/2016: EF 40%; BAE; mild AR/TR, mod MR; G1DD. b.) TTE 09/03/2016: EF 50%; mild LVH; BAE; triv AR/PR, mod MR/TR. c.) TTE 05/04/2019: EF >55%; mod LVH, mild LA enlargement; mild AR/MR/TR   CKD (chronic kidney disease), stage V (HCC)    Complication of anesthesia    a.) h/o intra/postoperative A.fib   Coronary artery disease    Diverticulosis    Gout    History of 2019 novel coronavirus disease (COVID-19) 11/14/2020     History of colon polyps    Hypercholesteremia    Hypertension    Long term current use of anticoagulant    a.) dose reduced apixaban; dose reduction d/t to age and CKD Dx.   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HCC)    a.) s/p resection   MGUS (monoclonal gammopathy of unknown significance)    Pleural effusion    Rectal bleeding    Secondary hyperparathyroidism of renal origin (HCC)     Social History   Socioeconomic History   Marital status: Married    Spouse name: [name]   Number of children: Not on file   Years of education: Not on file   Highest education level: Not on file  Occupational History   Occupation: Retired  Tobacco Use   Smoking status: Former    Years: 15    Types: Cigarettes    Quit date: 01/01/1981    Years since quitting: 41.4   Smokeless tobacco: Never   Vaping Use   Vaping Use: Never used  Substance and Sexual Activity   Alcohol use: Not Currently   Drug use: No   Sexual activity: Yes    Birth control/protection: None  Other Topics Concern   Not on file  Social History Narrative   Regular exercise: Yes   Social Determinants of Health   Financial Resource Strain: Not on file  Food Insecurity: No Food Insecurity (02/02/2022)   Hunger Vital Sign    Worried About Running Out of Food in the Last Year: Never true    Ran Out of Food in the Last Year: Never true  Transportation Needs: No Transportation Needs (02/02/2022)   PRAPARE - Transportation    Lack of Transportation (Medical): No    Lack of Transportation (Non-Medical): No  Physical Activity: Not on file  Stress: Not on file  Social Connections: Not on file  Intimate Partner Violence: Not At Risk (02/02/2022)   Humiliation, Afraid, Rape, and Kick questionnaire    Fear of Current or Ex-Partner: No    Emotionally Abused: No    Physically Abused: No    Sexually Abused: No    Past Surgical History:  Procedure Laterality Date   ANKLE SURGERY Right 1985   following volleyball injury   APPENDECTOMY     at the of 9 yrs   AV FISTULA PLACEMENT Left 01/04/2022   Procedure: ARTERIOVENOUS (AV) FISTULA CREATION;  Surgeon: Dew, Jason S, MD;  Location: [facility];  Service: Vascular;  Laterality: Left;   CAPD INSERTION N/A 08/10/2021   Procedure: LAPAROSCOPIC INSERTION CONTINUOUS AMBULATORY PERITONEAL DIALYSIS  (CAPD) CATHETER;  Surgeon: Pabon, Diego F, MD;  Location: [facility];  Service: General;  Laterality: N/A;   CAPD REVISION N/A 09/21/2021   Procedure: LAPAROSCOPIC REVISION CONTINUOUS AMBULATORY PERITONEAL DIALYSIS  (CAPD) CATHETER;  Surgeon: Pabon, Diego F, MD;  Location: [facility];  Service: General;  Laterality: N/A;   CATARACT EXTRACTION Left    COLONOSCOPY     COLONOSCOPY WITH PROPOFOL N/A 01/02/2017   Procedure: COLONOSCOPY WITH PROPOFOL;  Surgeon: Elliott, Robert T, MD;   Location: [facility];  Service: Endoscopy;  Laterality: N/A;   DIALYSIS/PERMA CATHETER INSERTION N/A 03/27/2021   Procedure: DIALYSIS/PERMA CATHETER INSERTION;  Surgeon: Dew, Jason S, MD;  Location: [facility];  Service: Cardiovascular;  Laterality: N/A;   DIALYSIS/PERMA CATHETER INSERTION N/A 10/10/2021   Procedure: DIALYSIS/PERMA CATHETER INSERTION;  Surgeon: Schnier, Gregory G, MD;  Location: [facility];  Service: Cardiovascular;  Laterality: N/A;   DIALYSIS/PERMA CATHETER REMOVAL N/A   05/08/2021   Procedure: DIALYSIS/PERMA CATHETER REMOVAL;  Surgeon: Dew, Jason S, MD;  Location: [facility];  Service: Cardiovascular;  Laterality: N/A;   DIALYSIS/PERMA CATHETER REMOVAL N/A 04/17/2022   Procedure: DIALYSIS/PERMA CATHETER REMOVAL;  Surgeon: Schnier, Gregory G, MD;  Location: [facility];  Service: Cardiovascular;  Laterality: N/A;   INSERTION OF MESH  08/10/2021   Procedure: INSERTION OF MESH;  Surgeon: Pabon, Diego F, MD;  Location: [facility];  Service: General;;   JOINT REPLACEMENT     RIGHT TOTAL HIP   KNEE ARTHROSCOPY WITH LATERAL RELEASE Right 04/20/2021   Procedure: Right knee medial retinaculum and lateral release with polyethylene exchange;  Surgeon: Menz, Rolin, MD;  Location: [facility];  Service: Orthopedics;  Laterality: Right;   TONSILLECTOMY     as a child   TOTAL HIP ARTHROPLASTY  09/2009   Right femoral neck fracture   TOTAL HIP ARTHROPLASTY Left 03/06/2016   Procedure: TOTAL HIP ARTHROPLASTY ANTERIOR APPROACH;  Surgeon: Reynolds Menz, MD;  Location: [facility];  Service: Orthopedics;  Laterality: Left;   TOTAL KNEE ARTHROPLASTY Right 03/21/2021   Procedure: TOTAL KNEE ARTHROPLASTY;  Surgeon: Menz, Eitan, MD;  Location: [facility];  Service: Orthopedics;  Laterality: Right;   UMBILICAL HERNIA REPAIR  08/10/2021   Procedure: HERNIA REPAIR UMBILICAL ADULT;  Surgeon: Pabon, Diego F, MD;  Location: [facility];  Service: General;;    Family  History  Problem Relation Age of Onset   Cirrhosis Mother    Other Father        Lung Fibrosis   Prostate cancer Neg Hx    Kidney cancer Neg Hx    Bladder Cancer Neg Hx     No Known Allergies     Latest Ref Rng & Units 02/03/2022    9:29 AM 02/02/2022   10:06 AM 01/31/2022    5:23 PM  CBC  WBC 4.0 - 10.5 K/uL 6.1  6.3  7.4   Hemoglobin 13.0 - 17.0 g/dL 9.0  9.3  10.7   Hematocrit 39.0 - 52.0 % 25.8  26.9  30.8   Platelets 150 - 400 K/uL 132  128  139       CMP     Component Value Date/Time   NA 131 (L) 02/03/2022 0459   NA 139 02/04/2014 1449   K 3.1 (L) 02/03/2022 0459   K 4.6 02/04/2014 1449   CL 91 (L) 02/03/2022 0459   CL 105 02/04/2014 1449   CO2 25 02/03/2022 0459   CO2 27 02/04/2014 1449   GLUCOSE 95 02/03/2022 0459   GLUCOSE 84 02/04/2014 1449   BUN 91 (H) 02/03/2022 0459   BUN 23 (H) 02/04/2014 1449   CREATININE 6.64 (H) 02/03/2022 0459   CREATININE 1.25 02/04/2014 1449   CALCIUM 8.3 (L) 02/03/2022 0459   CALCIUM 8.7 02/04/2014 1449   PROT 7.4 01/31/2022 1723   ALBUMIN 3.8 01/31/2022 1723   AST 14 (L) 01/31/2022 1723   ALT 12 01/31/2022 1723   ALKPHOS 77 01/31/2022 1723   BILITOT 1.4 (H) 01/31/2022 1723   GFRNONAA 8 (L) 02/03/2022 0459   GFRNONAA >60 02/04/2014 1449   GFRAA 22 (L) 09/15/2019 0856   GFRAA >60 02/04/2014 1449     No results found.     Assessment & Plan:   1. ESRD (end stage renal disease) (HCC) Recommend:  The patient is experiencing increasing problems with their dialysis access.  Patient should have a fistulagram with the intention for intervention.  The intention   for intervention is to restore appropriate flow and prevent thrombosis and possible loss of the access.  As well as improve the quality of dialysis therapy.  The risks, benefits and alternative therapies were reviewed in detail with the patient.  All questions were answered.  The patient agrees to proceed with angio/intervention.    The patient will follow up with  me in the office after the procedure.   2. Hyperlipidemia, unspecified hyperlipidemia type Continue statin as ordered and reviewed, no changes at this time   Current Outpatient Medications on File Prior to Visit  Medication Sig Dispense Refill   apixaban (ELIQUIS) 2.5 MG TABS tablet Take 2.5 mg by mouth 2 (two) times daily.     B Complex Vitamins (B-COMPLEX/B-12 PO) Take 1,000 mcg by mouth.     calcitRIOL (ROCALTROL) 0.25 MCG capsule Take 0.25 mcg by mouth daily.     carvedilol (COREG) 3.125 MG tablet Take 3.125 mg by mouth 2 (two) times daily with a meal.      cholecalciferol (VITAMIN D3) 25 MCG (1000 UNIT) tablet Take 1,000 Units by mouth daily.     cyanocobalamin (VITAMIN B12) 1000 MCG tablet Take 2,500 mcg by mouth daily.     Multiple Vitamin (MULTIVITAMIN WITH MINERALS) TABS tablet Take 1 tablet by mouth daily.     rosuvastatin (CRESTOR) 10 MG tablet Take 10 mg by mouth at bedtime.     Saccharomyces boulardii (PROBIOTIC) 250 MG CAPS Take 1 capsule by mouth in the morning and at bedtime. 12 capsule 0   tamsulosin (FLOMAX) 0.4 MG CAPS capsule Take 1 capsule (0.4 mg total) by mouth daily. 90 capsule 3   torsemide (DEMADEX) 20 MG tablet Take 2 tablets (40 mg total) by mouth daily. 60 tablet 0   traZODone (DESYREL) 50 MG tablet Take 50 mg by mouth at bedtime as needed for sleep.     No current facility-administered medications on file prior to visit.    There are no Patient Instructions on file for this visit. No follow-ups on file.   Fed Ceci E Cherylann Hobday, NP   

## 2022-05-19 DIAGNOSIS — N186 End stage renal disease: Secondary | ICD-10-CM | POA: Diagnosis not present

## 2022-05-19 DIAGNOSIS — Z992 Dependence on renal dialysis: Secondary | ICD-10-CM | POA: Diagnosis not present

## 2022-05-22 DIAGNOSIS — Z992 Dependence on renal dialysis: Secondary | ICD-10-CM | POA: Diagnosis not present

## 2022-05-22 DIAGNOSIS — N186 End stage renal disease: Secondary | ICD-10-CM | POA: Diagnosis not present

## 2022-05-24 ENCOUNTER — Telehealth (INDEPENDENT_AMBULATORY_CARE_PROVIDER_SITE_OTHER): Payer: Self-pay

## 2022-05-24 DIAGNOSIS — N186 End stage renal disease: Secondary | ICD-10-CM | POA: Diagnosis not present

## 2022-05-24 DIAGNOSIS — Z992 Dependence on renal dialysis: Secondary | ICD-10-CM | POA: Diagnosis not present

## 2022-05-24 NOTE — Telephone Encounter (Signed)
Spoke with the patient and he is scheduled with Dr. Wyn Quaker on 05/31/22 with a 1:30 pm arrival time to the [facility]. Pre-procedure instructions were discussed and will be sent to Mychart and mailed.

## 2022-05-24 NOTE — Telephone Encounter (Signed)
I attempted to contact the patient to schedule a left arm fistulagram with Dr. Dew. A message was left for a return call. 

## 2022-05-26 DIAGNOSIS — Z992 Dependence on renal dialysis: Secondary | ICD-10-CM | POA: Diagnosis not present

## 2022-05-26 DIAGNOSIS — N186 End stage renal disease: Secondary | ICD-10-CM | POA: Diagnosis not present

## 2022-05-29 DIAGNOSIS — N186 End stage renal disease: Secondary | ICD-10-CM | POA: Diagnosis not present

## 2022-05-29 DIAGNOSIS — Z992 Dependence on renal dialysis: Secondary | ICD-10-CM | POA: Diagnosis not present

## 2022-05-31 ENCOUNTER — Encounter
Admission: RE | Disposition: A | Discharge status: Home / Self Care | Payer: Self-pay | Source: Home / Self Care | Attending: Vascular Surgery

## 2022-05-31 ENCOUNTER — Ambulatory Visit
Admission: RE | Admit: 2022-05-31 | Discharge: 2022-05-31 | Disposition: A | Discharge status: Home / Self Care | Payer: Medicare HMO | Attending: Vascular Surgery | Admitting: Vascular Surgery

## 2022-05-31 ENCOUNTER — Other Ambulatory Visit: Payer: Self-pay

## 2022-05-31 ENCOUNTER — Encounter: Payer: Self-pay | Admitting: Vascular Surgery

## 2022-05-31 DIAGNOSIS — Z539 Procedure and treatment not carried out, unspecified reason: Secondary | ICD-10-CM | POA: Diagnosis not present

## 2022-05-31 DIAGNOSIS — N186 End stage renal disease: Secondary | ICD-10-CM | POA: Insufficient documentation

## 2022-05-31 DIAGNOSIS — Z992 Dependence on renal dialysis: Secondary | ICD-10-CM | POA: Diagnosis not present

## 2022-05-31 LAB — POTASSIUM (ARMC VASCULAR LAB ONLY): Potassium (ARMC vascular lab): 4.5 mmol/L (ref 3.5–5.1)

## 2022-05-31 SURGERY — A/V FISTULAGRAM
Anesthesia: Moderate Sedation | Laterality: Left

## 2022-05-31 MED ORDER — FAMOTIDINE 20 MG PO TABS: 40.0000 mg | ORAL_TABLET | Freq: Once | ORAL | Status: DC | PRN

## 2022-05-31 MED ORDER — MIDAZOLAM HCL 2 MG/ML PO SYRP: 8.0000 mg | ORAL_SOLUTION | Freq: Once | ORAL | Status: DC | PRN

## 2022-05-31 MED ORDER — CEFAZOLIN SODIUM-DEXTROSE 2-4 GM/100ML-% IV SOLN: 2.0000 g | INTRAVENOUS | Status: DC

## 2022-05-31 MED ORDER — CEFAZOLIN SODIUM-DEXTROSE 1-4 GM/50ML-% IV SOLN
INTRAVENOUS | Status: AC
  Filled 2022-05-31: qty 50

## 2022-05-31 MED ORDER — ONDANSETRON HCL 4 MG/2ML IJ SOLN: 4.0000 mg | Freq: Four times a day (QID) | INTRAMUSCULAR | Status: DC | PRN

## 2022-05-31 MED ORDER — DIPHENHYDRAMINE HCL 50 MG/ML IJ SOLN: 50.0000 mg | Freq: Once | INTRAMUSCULAR | Status: DC | PRN

## 2022-05-31 MED ORDER — HYDROMORPHONE HCL 1 MG/ML IJ SOLN: 1.0000 mg | Freq: Once | INTRAMUSCULAR | Status: DC | PRN

## 2022-05-31 MED ORDER — SODIUM CHLORIDE 0.9 % IV SOLN: INTRAVENOUS | Status: DC

## 2022-05-31 MED ORDER — METHYLPREDNISOLONE SODIUM SUCC 125 MG IJ SOLR: 125.0000 mg | Freq: Once | INTRAMUSCULAR | Status: DC | PRN

## 2022-05-31 MED ORDER — CEFAZOLIN SODIUM-DEXTROSE 1-4 GM/50ML-% IV SOLN: 1.0000 g | INTRAVENOUS | Status: DC

## 2022-06-01 ENCOUNTER — Telehealth (INDEPENDENT_AMBULATORY_CARE_PROVIDER_SITE_OTHER): Payer: Self-pay

## 2022-06-01 DIAGNOSIS — N186 End stage renal disease: Secondary | ICD-10-CM | POA: Diagnosis not present

## 2022-06-01 DIAGNOSIS — Z992 Dependence on renal dialysis: Secondary | ICD-10-CM | POA: Diagnosis not present

## 2022-06-01 NOTE — Telephone Encounter (Signed)
Patient called stating his procedure needed to be rescheduled from 05/31/22 with Dr. Wyn Quaker. Patient has  been rescheduled to 06/11/22 with a 6:45 am arrival time to the [facility]. Pre-procedure instructions were discussed and will be sent to Mychart and mailed.

## 2022-06-02 DIAGNOSIS — Z992 Dependence on renal dialysis: Secondary | ICD-10-CM | POA: Diagnosis not present

## 2022-06-02 DIAGNOSIS — N186 End stage renal disease: Secondary | ICD-10-CM | POA: Diagnosis not present

## 2022-06-05 DIAGNOSIS — N186 End stage renal disease: Secondary | ICD-10-CM | POA: Diagnosis not present

## 2022-06-05 DIAGNOSIS — Z992 Dependence on renal dialysis: Secondary | ICD-10-CM | POA: Diagnosis not present

## 2022-06-06 NOTE — Progress Notes (Unsigned)
01/24/2018 2:29 PM   Lawrence Perkins. Oct 27, 1940 [id number]  Referring provider: Lauro Regulus, MD [address] [facility] [place],  [place] [address]  Urological history 1. BPH with LU TS - PSA 0.95 in 2016 - discontinued screening due to age -prostate volume 40 grms - CT, 2022 - managed with tamsulosin 0.4 mg daily  2. High risk hematuria -former smoker -non-contrast CT, 2022 (no contrast due to CKD) - no worrisome findings -cysto, 2022 - subtle dilation of ejaculatory duct but no definite papillary tumors -urine cytology, 2022 - NED  3. Nephrolithiasis -2 mm right lower pole stone on 2023 CT  4. Renal cyst -Exophytic right lower pole renal cyst which appears to be involuting is slightly decreased in size compared to prior, now measuring 1.8 x 1.1 cm previously 1.8 x 1.5 cm on 2022 CT -non contrast CT 2023 Bilateral renal cysts are present measuring up to 17 mm   No chief complaint on file.    HPI: Lawrence Perkins. is a 82 y.o. male with BPH with LU TS who presents today for an annual exam.  Urinalysis in February 2024 noted RBC of 39 with negative cultures from his PCPs office  I PSS ***    Score:  1-7 Mild 8-19 Moderate 20-35 Severe      PMH: Past Medical History:  Diagnosis Date   Anemia    Aortic atherosclerosis (HCC)    Arthritis    Atrial fibrillation and flutter (HCC)    a.) CHA2DS2-VASc = 5 (age x 2, CHF, HTN, aortic plaque). b.) rate/rhythm maintain on oral carvedilol; chronically anticoagulated with dose reduced apixaban   Bilateral inguinal hernia    BPH (benign prostatic hyperplasia)    Cardiac syncope    Cardiomyopathy (HCC)    a.) TTE 04/23/2016: EF 40%. b.) TTE 09/03/2016: EF 50%. c.) TTE 05/04/2019: EF >55%.   CHF (congestive heart failure) (HCC)    a.) TTE 04/23/2016: EF 40%; BAE; mild AR/TR, mod MR; G1DD. b.) TTE 09/03/2016: EF 50%; mild LVH; BAE; triv AR/PR, mod MR/TR. c.) TTE 05/04/2019: EF >55%;  mod LVH, mild LA enlargement; mild AR/MR/TR   CKD (chronic kidney disease), stage V (HCC)    Complication of anesthesia    a.) h/o intra/postoperative A.fib   Coronary artery disease    Diverticulosis    Gout    History of 2019 novel coronavirus disease (COVID-19) 11/14/2020   History of colon polyps    Hypercholesteremia    Hypertension    Long term current use of anticoagulant    a.) dose reduced apixaban; dose reduction d/t to age and CKD Dx.   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HCC)    a.) s/p resection   MGUS (monoclonal gammopathy of unknown significance)    Pleural effusion    Rectal bleeding    Secondary hyperparathyroidism of renal origin [facility])     Surgical History: Past Surgical History:  Procedure Laterality Date   ANKLE SURGERY Right 1985   following volleyball injury   APPENDECTOMY     at the of 9 yrs   AV FISTULA PLACEMENT Left 01/04/2022   Procedure: ARTERIOVENOUS (AV) FISTULA CREATION;  Surgeon: Annice Needy, MD;  Location: [facility];  Service: Vascular;  Laterality: Left;   CAPD INSERTION N/A 08/10/2021   Procedure: LAPAROSCOPIC INSERTION CONTINUOUS AMBULATORY PERITONEAL DIALYSIS  (CAPD) CATHETER;  Surgeon: Leafy Ro, MD;  Location: [facility];  Service: General;  Laterality: N/A;   CAPD  REVISION N/A 09/21/2021   Procedure: LAPAROSCOPIC REVISION CONTINUOUS AMBULATORY PERITONEAL DIALYSIS  (CAPD) CATHETER;  Surgeon: Leafy Ro, MD;  Location: [facility];  Service: General;  Laterality: N/A;   CATARACT EXTRACTION Left    COLONOSCOPY     COLONOSCOPY WITH PROPOFOL N/A 01/02/2017   Procedure: COLONOSCOPY WITH PROPOFOL;  Surgeon: Scot Jun, MD;  Location: [facility];  Service: Endoscopy;  Laterality: N/A;   DIALYSIS/PERMA CATHETER INSERTION N/A 03/27/2021   Procedure: DIALYSIS/PERMA CATHETER INSERTION;  Surgeon: Annice Needy, MD;  Location: [facility];  Service: Cardiovascular;  Laterality: N/A;   DIALYSIS/PERMA CATHETER INSERTION N/A 10/10/2021   Procedure:  DIALYSIS/PERMA CATHETER INSERTION;  Surgeon: Renford Dills, MD;  Location: [facility];  Service: Cardiovascular;  Laterality: N/A;   DIALYSIS/PERMA CATHETER REMOVAL N/A 05/08/2021   Procedure: DIALYSIS/PERMA CATHETER REMOVAL;  Surgeon: Annice Needy, MD;  Location: [facility];  Service: Cardiovascular;  Laterality: N/A;   DIALYSIS/PERMA CATHETER REMOVAL N/A 04/17/2022   Procedure: DIALYSIS/PERMA CATHETER REMOVAL;  Surgeon: Renford Dills, MD;  Location: [facility];  Service: Cardiovascular;  Laterality: N/A;   INSERTION OF MESH  08/10/2021   Procedure: INSERTION OF MESH;  Surgeon: Leafy Ro, MD;  Location: [facility];  Service: General;;   JOINT REPLACEMENT     RIGHT TOTAL HIP   KNEE ARTHROSCOPY WITH LATERAL RELEASE Right 04/20/2021   Procedure: Right knee medial retinaculum and lateral release with polyethylene exchange;  Surgeon: Kennedy Bucker, MD;  Location: [facility];  Service: Orthopedics;  Laterality: Right;   TONSILLECTOMY     as a child   TOTAL HIP ARTHROPLASTY  09/2009   Right femoral neck fracture   TOTAL HIP ARTHROPLASTY Left 03/06/2016   Procedure: TOTAL HIP ARTHROPLASTY ANTERIOR APPROACH;  Surgeon: Kennedy Bucker, MD;  Location: [facility];  Service: Orthopedics;  Laterality: Left;   TOTAL KNEE ARTHROPLASTY Right 03/21/2021   Procedure: TOTAL KNEE ARTHROPLASTY;  Surgeon: Kennedy Bucker, MD;  Location: [facility];  Service: Orthopedics;  Laterality: Right;   UMBILICAL HERNIA REPAIR  08/10/2021   Procedure: HERNIA REPAIR UMBILICAL ADULT;  Surgeon: Leafy Ro, MD;  Location: [facility];  Service: General;;    Home Medications:  Allergies as of 06/07/2022   No Known Allergies      Medication List        Accurate as of June 06, 2022  2:29 PM. If you have any questions, ask your nurse or doctor.          apixaban 2.5 MG Tabs tablet Commonly known as: ELIQUIS Take 2.5 mg by mouth 2 (two) times daily.   B-COMPLEX/B-12 PO Take 1,000 mcg  by mouth.   calcitRIOL 0.25 MCG capsule Commonly known as: ROCALTROL Take 0.25 mcg by mouth daily.   carvedilol 3.125 MG tablet Commonly known as: COREG Take 3.125 mg by mouth 2 (two) times daily with a meal.   cholecalciferol 25 MCG (1000 UNIT) tablet Commonly known as: VITAMIN D3 Take 1,000 Units by mouth daily.   cyanocobalamin 1000 MCG tablet Commonly known as: VITAMIN B12 Take 2,500 mcg by mouth daily.   multivitamin with minerals Tabs tablet Take 1 tablet by mouth daily.   Probiotic 250 MG Caps Take 1 capsule by mouth in the morning and at bedtime.   rosuvastatin 10 MG tablet Commonly known as: CRESTOR Take 10 mg by mouth at bedtime.   tamsulosin 0.4 MG Caps capsule Commonly known as: FLOMAX Take 1 capsule (0.4 mg  total) by mouth daily.   torsemide 20 MG tablet Commonly known as: DEMADEX Take 2 tablets (40 mg total) by mouth daily.   traZODone 50 MG tablet Commonly known as: DESYREL Take 50 mg by mouth at bedtime as needed for sleep.        Allergies: No Known Allergies  Family History: Family History  Problem Relation Age of Onset   Cirrhosis Mother    Other Father        Lung Fibrosis   Prostate cancer Neg Hx    Kidney cancer Neg Hx    Bladder Cancer Neg Hx     Social History:  reports that he quit smoking about 41 years ago. His smoking use included cigarettes. He has never used smokeless tobacco. He reports that he does not currently use alcohol. He reports that he does not use drugs.  For pertinent review of systems please refer to history of present illness  Physical Exam: There were no vitals taken for this visit.  Constitutional:  Well nourished. Alert and oriented, No acute distress. HEENT: [place] AT, moist mucus membranes.  Trachea midline Cardiovascular: No clubbing, cyanosis, or edema. Respiratory: Normal respiratory effort, no increased work of breathing. Neurologic: Grossly intact, no focal deficits, moving all 4  extremities. Psychiatric: Normal mood and affect.   Laboratory Data: Lab Results  Component Value Date   WBC 6.1 02/03/2022   HGB 9.0 (L) 02/03/2022   HCT 25.8 (L) 02/03/2022   MCV 98.9 02/03/2022   PLT 132 (L) 02/03/2022    Lab Results  Component Value Date   CREATININE 6.64 (H) 02/03/2022   Urinalysis See EPIC and HPI I have reviewed the labs.  Pertinent Imaging ***  Assessment & Plan:    1. High risk hematuria -work up 2022, NED -no reports of gross heme -UA ***   2. BPH with LUTS -UA with micro heme *** -PVR < 300 cc -continue conservative management, avoiding bladder irritants and timed voiding's -Continue tamsulosin 0.4 mg daily  3. Right renal stone -asymptomatic   No follow-ups on file.  These notes generated with voice recognition software. I apologize for typographical errors.  Cloretta Ned  [facility] [address], [address] [place], [place] [address] [phone]

## 2022-06-07 ENCOUNTER — Ambulatory Visit (INDEPENDENT_AMBULATORY_CARE_PROVIDER_SITE_OTHER): Payer: Medicare HMO | Admitting: Urology

## 2022-06-07 ENCOUNTER — Encounter: Payer: Self-pay | Admitting: Urology

## 2022-06-07 ENCOUNTER — Ambulatory Visit: Payer: Medicare HMO | Admitting: Urology

## 2022-06-07 VITALS — BP 117/75 | HR 57 | Ht 70.0 in | Wt 174.2 lb

## 2022-06-07 DIAGNOSIS — N401 Enlarged prostate with lower urinary tract symptoms: Secondary | ICD-10-CM

## 2022-06-07 DIAGNOSIS — N200 Calculus of kidney: Secondary | ICD-10-CM

## 2022-06-07 DIAGNOSIS — N186 End stage renal disease: Secondary | ICD-10-CM | POA: Diagnosis not present

## 2022-06-07 DIAGNOSIS — Z992 Dependence on renal dialysis: Secondary | ICD-10-CM | POA: Diagnosis not present

## 2022-06-07 DIAGNOSIS — R319 Hematuria, unspecified: Secondary | ICD-10-CM

## 2022-06-07 DIAGNOSIS — R3129 Other microscopic hematuria: Secondary | ICD-10-CM | POA: Diagnosis not present

## 2022-06-07 DIAGNOSIS — N138 Other obstructive and reflux uropathy: Secondary | ICD-10-CM

## 2022-06-07 LAB — BLADDER SCAN AMB NON-IMAGING: Scan Result: 23

## 2022-06-08 LAB — URINALYSIS, COMPLETE
Bilirubin, UA: NEGATIVE
Glucose, UA: NEGATIVE
Ketones, UA: NEGATIVE
Nitrite, UA: NEGATIVE
Specific Gravity, UA: 1.02 (ref 1.005–1.030)
Urobilinogen, Ur: 0.2 mg/dL (ref 0.2–1.0)
pH, UA: 5.5 (ref 5.0–7.5)

## 2022-06-08 LAB — MICROSCOPIC EXAMINATION: Bacteria, UA: NONE SEEN

## 2022-06-09 DIAGNOSIS — N186 End stage renal disease: Secondary | ICD-10-CM | POA: Diagnosis not present

## 2022-06-09 DIAGNOSIS — Z992 Dependence on renal dialysis: Secondary | ICD-10-CM | POA: Diagnosis not present

## 2022-06-11 ENCOUNTER — Ambulatory Visit
Admission: RE | Admit: 2022-06-11 | Discharge: 2022-06-11 | Disposition: A | Discharge status: Home / Self Care | Payer: Medicare HMO | Attending: Vascular Surgery | Admitting: Vascular Surgery

## 2022-06-11 ENCOUNTER — Encounter
Admission: RE | Disposition: A | Discharge status: Home / Self Care | Payer: Self-pay | Source: Home / Self Care | Attending: Vascular Surgery

## 2022-06-11 ENCOUNTER — Other Ambulatory Visit: Payer: Self-pay

## 2022-06-11 ENCOUNTER — Encounter: Payer: Self-pay | Admitting: Vascular Surgery

## 2022-06-11 DIAGNOSIS — I509 Heart failure, unspecified: Secondary | ICD-10-CM | POA: Diagnosis not present

## 2022-06-11 DIAGNOSIS — E785 Hyperlipidemia, unspecified: Secondary | ICD-10-CM | POA: Diagnosis not present

## 2022-06-11 DIAGNOSIS — N186 End stage renal disease: Secondary | ICD-10-CM | POA: Diagnosis not present

## 2022-06-11 DIAGNOSIS — I132 Hypertensive heart and chronic kidney disease with heart failure and with stage 5 chronic kidney disease, or end stage renal disease: Secondary | ICD-10-CM | POA: Diagnosis not present

## 2022-06-11 DIAGNOSIS — T82858A Stenosis of vascular prosthetic devices, implants and grafts, initial encounter: Secondary | ICD-10-CM | POA: Diagnosis not present

## 2022-06-11 DIAGNOSIS — Z992 Dependence on renal dialysis: Secondary | ICD-10-CM | POA: Diagnosis not present

## 2022-06-11 DIAGNOSIS — Z87891 Personal history of nicotine dependence: Secondary | ICD-10-CM | POA: Insufficient documentation

## 2022-06-11 DIAGNOSIS — Y841 Kidney dialysis as the cause of abnormal reaction of the patient, or of later complication, without mention of misadventure at the time of the procedure: Secondary | ICD-10-CM | POA: Diagnosis not present

## 2022-06-11 HISTORY — PX: A/V FISTULAGRAM: CATH118298

## 2022-06-11 LAB — POTASSIUM (ARMC VASCULAR LAB ONLY): Potassium (ARMC vascular lab): 4.3 mmol/L (ref 3.5–5.1)

## 2022-06-11 SURGERY — A/V FISTULAGRAM
Anesthesia: Moderate Sedation | Laterality: Left

## 2022-06-11 MED ORDER — HYDROMORPHONE HCL 1 MG/ML IJ SOLN: 1.0000 mg | Freq: Once | INTRAMUSCULAR | Status: DC | PRN

## 2022-06-11 MED ORDER — ONDANSETRON HCL 4 MG/2ML IJ SOLN: 4.0000 mg | Freq: Four times a day (QID) | INTRAMUSCULAR | Status: DC | PRN

## 2022-06-11 MED ORDER — MIDAZOLAM HCL 2 MG/2ML IJ SOLN
INTRAMUSCULAR | Status: AC
  Filled 2022-06-11: qty 2

## 2022-06-11 MED ORDER — SODIUM CHLORIDE 0.9 % IV SOLN: INTRAVENOUS | Status: DC

## 2022-06-11 MED ORDER — FENTANYL CITRATE (PF) 100 MCG/2ML IJ SOLN
INTRAMUSCULAR | Status: DC | PRN
  Administered 2022-06-11 (×2): 50 ug via INTRAVENOUS

## 2022-06-11 MED ORDER — HEPARIN SODIUM (PORCINE) 1000 UNIT/ML IJ SOLN
INTRAMUSCULAR | Status: AC
  Filled 2022-06-11: qty 10

## 2022-06-11 MED ORDER — DIPHENHYDRAMINE HCL 50 MG/ML IJ SOLN: 50.0000 mg | Freq: Once | INTRAMUSCULAR | Status: DC | PRN

## 2022-06-11 MED ORDER — CEFAZOLIN SODIUM-DEXTROSE 1-4 GM/50ML-% IV SOLN
1.0000 g | INTRAVENOUS | Status: AC
  Administered 2022-06-11: 1 g via INTRAVENOUS

## 2022-06-11 MED ORDER — METHYLPREDNISOLONE SODIUM SUCC 125 MG IJ SOLR: 125.0000 mg | Freq: Once | INTRAMUSCULAR | Status: DC | PRN

## 2022-06-11 MED ORDER — FENTANYL CITRATE (PF) 100 MCG/2ML IJ SOLN
INTRAMUSCULAR | Status: AC
  Filled 2022-06-11: qty 2

## 2022-06-11 MED ORDER — FAMOTIDINE 20 MG PO TABS: 40.0000 mg | ORAL_TABLET | Freq: Once | ORAL | Status: DC | PRN

## 2022-06-11 MED ORDER — MIDAZOLAM HCL 2 MG/2ML IJ SOLN
INTRAMUSCULAR | Status: DC | PRN
  Administered 2022-06-11: 2 mg via INTRAVENOUS

## 2022-06-11 MED ORDER — HEPARIN SODIUM (PORCINE) 1000 UNIT/ML IJ SOLN
INTRAMUSCULAR | Status: DC | PRN
  Administered 2022-06-11: 3000 [IU] via INTRAVENOUS

## 2022-06-11 MED ORDER — CEFAZOLIN SODIUM-DEXTROSE 1-4 GM/50ML-% IV SOLN
INTRAVENOUS | Status: AC
  Filled 2022-06-11: qty 50

## 2022-06-11 MED ORDER — IODIXANOL 320 MG/ML IV SOLN
INTRAVENOUS | Status: DC | PRN
  Administered 2022-06-11: 20 mL

## 2022-06-11 MED ORDER — MIDAZOLAM HCL 2 MG/ML PO SYRP: 8.0000 mg | ORAL_SOLUTION | Freq: Once | ORAL | Status: DC | PRN

## 2022-06-11 SURGICAL SUPPLY — 16 items
BALLN LUTONIX 7X100X130 (BALLOONS) ×1
BALLN LUTONIX DCB 6X100X130 (BALLOONS) ×1
BALLOON LUTONIX 7X100X130 (BALLOONS) IMPLANT
BALLOON LUTONIX DCB 6X100X130 (BALLOONS) IMPLANT
COVER PROBE ULTRASOUND 5X96 (MISCELLANEOUS) IMPLANT
DRAPE BRACHIAL (DRAPES) IMPLANT
KIT ENCORE 26 ADVANTAGE (KITS) IMPLANT
KIT MICROPUNCTURE NIT STIFF (SHEATH) IMPLANT
PACK ANGIOGRAPHY (CUSTOM PROCEDURE TRAY) ×1 IMPLANT
SHEATH BRITE TIP 6FRX5.5 (SHEATH) IMPLANT
SHEATH BRITE TIP 7FRX5.5 (SHEATH) IMPLANT
STENT VIABAHN 8X100X120 (Permanent Stent) ×1 IMPLANT
STENT VIABAHN 8X10X120 (Permanent Stent) IMPLANT
SUT MNCRL AB 4-0 PS2 18 (SUTURE) IMPLANT
WIRE G 018X200 V18 (WIRE) IMPLANT
WIRE SUPRACORE 190CM (WIRE) IMPLANT

## 2022-06-11 NOTE — Interval H&P Note (Signed)
History and Physical Interval Note:  06/11/2022 7:47 AM  Lawrence Perkins.  has presented today for surgery, with the diagnosis of L arm fistulagram   End Stage Renal.  The various methods of treatment have been discussed with the patient and family. After consideration of risks, benefits and other options for treatment, the patient has consented to  Procedure(s): A/V Fistulagram (Left) as a surgical intervention.  The patient's history has been reviewed, patient examined, no change in status, stable for surgery.  I have reviewed the patient's chart and labs.  Questions were answered to the patient's satisfaction.     [name]

## 2022-06-11 NOTE — Op Note (Signed)
[facility]    OPERATIVE NOTE   PROCEDURE: 1.   Brachiocephalic arteriovenous fistula cannulation under ultrasound guidance 2.   Left arm fistulagram including central venogram 3.   Percutaneous transluminal angioplasty of the cephalic vein stenosis with a 6 mm x 10 cm Lutonix drug-coated angioplasty balloon 4.   Stent placement to the right mid upper arm cephalic vein with 8 mm diam 10 cm length Viabahn stent  PRE-OPERATIVE DIAGNOSIS: 1. ESRD 2. Poorly functional left brachiocephalic AVF  POST-OPERATIVE DIAGNOSIS: same as above   SURGEON: [name], MD  ANESTHESIA: local with MCS  ESTIMATED BLOOD LOSS: 5 cc  FINDING(S): 90% stenosis of the mid upper arm cephalic.  The remainder of the fistula and the central venous circulation.  SPECIMEN(S):  None  CONTRAST: 20 cc  FLUORO TIME: 1.2 minutes  MODERATE CONSCIOUS SEDATION TIME: Approximately 22 minutes with 2 mg of Versed and 100 mcg of Fentanyl   INDICATIONS: Lawrence Perkins. is a 82 y.o. male who presents with malfunctioning left brachiocephalic arteriovenous fistula.  The patient is scheduled for left arm fistulagram.  The patient is aware the risks include but are not limited to: bleeding, infection, thrombosis of the cannulated access, and possible anaphylactic reaction to the contrast.  The patient is aware of the risks of the procedure and elects to proceed forward.  DESCRIPTION: After full informed written consent was obtained, the patient was brought back to the angiography suite and placed supine upon the angiography table.  The patient was connected to monitoring equipment. Moderate conscious sedation was administered with a face to face encounter with the patient throughout the procedure with my supervision of the RN administering medicines and monitoring the patient's vital signs and mental status throughout from the start of the procedure until the patient was taken to the recovery room. The  left arm was prepped and draped in the standard fashion for a percutaneous access intervention.  Under ultrasound guidance, the left brachiocephalic arteriovenous fistula was cannulated with a micropuncture needle under direct ultrasound guidance where it was patent and a permanent image was performed.  The microwire was advanced into the fistula and the needle was exchanged for the a microsheath.  I then upsized to a 6 Fr Sheath and imaging was performed.  Hand injections were completed to image the access including the central venous system. This demonstrated 90% stenosis of the mid upper arm cephalic.  The remainder of the fistula and the central venous circulation.  Based on the images, this patient will need intervention to the stenosis. I then gave the patient 3000 units of intravenous heparin.  I then crossed the stenosis with a Supercore wire.  Based on the imaging, a 6 mm x 10 cm Lutonix drug-coated angioplasty balloon was selected.  The balloon was centered around the mid upper arm cephalic vein stenosis and inflated to 10 ATM for 1 minute(s).  On completion imaging, a greater than 50% residual stenosis was present.  I elected to stent this area.  Upsized to a 7 [place] sheath.  Exchanged for a 018 wire.  I then selected an 8 mm diameter by 10 cm via bond stent and deployed this across the lesion postdilated with a 7 mm diameter Lutonix drug-coated balloon.  Completion imaging showed less than 20% residual stenosis.   Based on the completion imaging, no further intervention is necessary.  The wire and balloon were removed from the sheath.  A 4-0 Monocryl purse-string suture was sewn around  the sheath.  The sheath was removed while tying down the suture.  A sterile bandage was applied to the puncture site.  COMPLICATIONS: None  CONDITION: Stable   [name]  06/11/2022 9:03 AM   This note was created with Dragon Medical transcription system. Any errors in dictation are purely unintentional.

## 2022-06-12 DIAGNOSIS — Z992 Dependence on renal dialysis: Secondary | ICD-10-CM | POA: Diagnosis not present

## 2022-06-12 DIAGNOSIS — N186 End stage renal disease: Secondary | ICD-10-CM | POA: Diagnosis not present

## 2022-06-14 DIAGNOSIS — Z992 Dependence on renal dialysis: Secondary | ICD-10-CM | POA: Diagnosis not present

## 2022-06-14 DIAGNOSIS — N186 End stage renal disease: Secondary | ICD-10-CM | POA: Diagnosis not present

## 2022-06-16 DIAGNOSIS — Z992 Dependence on renal dialysis: Secondary | ICD-10-CM | POA: Diagnosis not present

## 2022-06-16 DIAGNOSIS — N186 End stage renal disease: Secondary | ICD-10-CM | POA: Diagnosis not present

## 2022-06-18 ENCOUNTER — Ambulatory Visit
Admission: RE | Admit: 2022-06-18 | Discharge: 2022-06-18 | Disposition: A | Discharge status: Home / Self Care | Payer: Medicare HMO | Source: Ambulatory Visit | Attending: Urology | Admitting: Urology

## 2022-06-18 DIAGNOSIS — R3129 Other microscopic hematuria: Secondary | ICD-10-CM | POA: Diagnosis not present

## 2022-06-19 DIAGNOSIS — I679 Cerebrovascular disease, unspecified: Secondary | ICD-10-CM | POA: Diagnosis not present

## 2022-06-19 DIAGNOSIS — D6869 Other thrombophilia: Secondary | ICD-10-CM | POA: Diagnosis not present

## 2022-06-19 DIAGNOSIS — Z992 Dependence on renal dialysis: Secondary | ICD-10-CM | POA: Diagnosis not present

## 2022-06-19 DIAGNOSIS — D8481 Immunodeficiency due to conditions classified elsewhere: Secondary | ICD-10-CM | POA: Diagnosis not present

## 2022-06-19 DIAGNOSIS — I700 Atherosclerosis of aorta: Secondary | ICD-10-CM | POA: Diagnosis not present

## 2022-06-19 DIAGNOSIS — D631 Anemia in chronic kidney disease: Secondary | ICD-10-CM | POA: Diagnosis not present

## 2022-06-19 DIAGNOSIS — Z7901 Long term (current) use of anticoagulants: Secondary | ICD-10-CM | POA: Diagnosis not present

## 2022-06-19 DIAGNOSIS — I132 Hypertensive heart and chronic kidney disease with heart failure and with stage 5 chronic kidney disease, or end stage renal disease: Secondary | ICD-10-CM | POA: Diagnosis not present

## 2022-06-19 DIAGNOSIS — N186 End stage renal disease: Secondary | ICD-10-CM | POA: Diagnosis not present

## 2022-06-19 DIAGNOSIS — G4700 Insomnia, unspecified: Secondary | ICD-10-CM | POA: Diagnosis not present

## 2022-06-19 DIAGNOSIS — I5032 Chronic diastolic (congestive) heart failure: Secondary | ICD-10-CM | POA: Diagnosis not present

## 2022-06-19 DIAGNOSIS — N2581 Secondary hyperparathyroidism of renal origin: Secondary | ICD-10-CM | POA: Diagnosis not present

## 2022-06-19 DIAGNOSIS — Z79899 Other long term (current) drug therapy: Secondary | ICD-10-CM | POA: Diagnosis not present

## 2022-06-19 DIAGNOSIS — I272 Pulmonary hypertension, unspecified: Secondary | ICD-10-CM | POA: Diagnosis not present

## 2022-06-19 DIAGNOSIS — I2510 Atherosclerotic heart disease of native coronary artery without angina pectoris: Secondary | ICD-10-CM | POA: Diagnosis not present

## 2022-06-21 DIAGNOSIS — Z992 Dependence on renal dialysis: Secondary | ICD-10-CM | POA: Diagnosis not present

## 2022-06-21 DIAGNOSIS — N186 End stage renal disease: Secondary | ICD-10-CM | POA: Diagnosis not present

## 2022-06-23 DIAGNOSIS — N186 End stage renal disease: Secondary | ICD-10-CM | POA: Diagnosis not present

## 2022-06-23 DIAGNOSIS — Z992 Dependence on renal dialysis: Secondary | ICD-10-CM | POA: Diagnosis not present

## 2022-06-25 ENCOUNTER — Other Ambulatory Visit (INDEPENDENT_AMBULATORY_CARE_PROVIDER_SITE_OTHER): Payer: Self-pay | Admitting: Vascular Surgery

## 2022-06-25 ENCOUNTER — Ambulatory Visit (INDEPENDENT_AMBULATORY_CARE_PROVIDER_SITE_OTHER): Payer: Medicare HMO | Admitting: Urology

## 2022-06-25 ENCOUNTER — Encounter: Payer: Self-pay | Admitting: Urology

## 2022-06-25 VITALS — BP 121/68 | HR 60 | Ht 70.0 in | Wt 175.0 lb

## 2022-06-25 DIAGNOSIS — R3129 Other microscopic hematuria: Secondary | ICD-10-CM | POA: Diagnosis not present

## 2022-06-25 DIAGNOSIS — N186 End stage renal disease: Secondary | ICD-10-CM

## 2022-06-25 LAB — URINALYSIS, COMPLETE
Bilirubin, UA: NEGATIVE
Glucose, UA: NEGATIVE
Ketones, UA: NEGATIVE
Nitrite, UA: NEGATIVE
Specific Gravity, UA: 1.015 (ref 1.005–1.030)
Urobilinogen, Ur: 0.2 mg/dL (ref 0.2–1.0)
pH, UA: 6 (ref 5.0–7.5)

## 2022-06-25 LAB — MICROSCOPIC EXAMINATION

## 2022-06-25 NOTE — Addendum Note (Signed)
Addended by: Levada Schilling on: 06/25/2022 09:40 AM   Modules accepted: Orders

## 2022-06-25 NOTE — Progress Notes (Signed)
   06/25/22  CC:  Chief Complaint  Patient presents with   Cysto    HPI: Refer to [facility] note 06/07/2022.  RUS with bilateral renal cysts.  Blood pressure 121/68, pulse 60, height 5\' 10"  (1.778 m), weight 175 lb (79.4 kg). NED. A&Ox3.   No respiratory distress   Abd soft, NT, ND Normal phallus with bilateral descended testicles  Cystoscopy Procedure Note  Patient identification was confirmed, informed consent was obtained, and patient was prepped using Betadine solution.  Lidocaine jelly was administered per urethral meatus.     Pre-Procedure: - Inspection reveals a normal caliber urethral meatus.  Procedure: The flexible cystoscope was introduced without difficulty - No urethral strictures/lesions are present. -Moderate lateral lobe enlargement prostate with hypervascularity - Normal bladder neck - Bilateral ureteral orifices identified - Bladder mucosa  reveals no ulcers, tumors, or lesions - No bladder stones -Moderate trabeculation  Retroflexion shows hypervascularity bladder neck, no intravesical median lobe   Post-Procedure: - Patient tolerated the procedure well  Assessment/ Plan: No significant lower tract abnormalities identified on cystoscopy 1 year follow-up with Onalee Hua, MD

## 2022-06-26 DIAGNOSIS — N186 End stage renal disease: Secondary | ICD-10-CM | POA: Diagnosis not present

## 2022-06-26 DIAGNOSIS — Z992 Dependence on renal dialysis: Secondary | ICD-10-CM | POA: Diagnosis not present

## 2022-06-27 LAB — CULTURE, URINE COMPREHENSIVE

## 2022-06-28 DIAGNOSIS — Z992 Dependence on renal dialysis: Secondary | ICD-10-CM | POA: Diagnosis not present

## 2022-06-28 DIAGNOSIS — N186 End stage renal disease: Secondary | ICD-10-CM | POA: Diagnosis not present

## 2022-06-28 LAB — CULTURE, URINE COMPREHENSIVE

## 2022-06-29 ENCOUNTER — Telehealth: Payer: Self-pay | Admitting: *Deleted

## 2022-06-29 NOTE — Telephone Encounter (Signed)
Pt calling asking for a rx for bacteria, pt states he had a urine culture and the results are back. I advised pt that culture was negative and he stated it wasn't due to bacteria. I tried explaining it could be how he collected the urine and that he needs to add more water. Pt also states he had a cysto with stoioff and wanted to know about that, I explained that it was negative as well, per office note.  Please advise

## 2022-06-30 DIAGNOSIS — N186 End stage renal disease: Secondary | ICD-10-CM | POA: Diagnosis not present

## 2022-06-30 DIAGNOSIS — Z992 Dependence on renal dialysis: Secondary | ICD-10-CM | POA: Diagnosis not present

## 2022-07-01 DIAGNOSIS — Z992 Dependence on renal dialysis: Secondary | ICD-10-CM | POA: Diagnosis not present

## 2022-07-01 DIAGNOSIS — N186 End stage renal disease: Secondary | ICD-10-CM | POA: Diagnosis not present

## 2022-07-03 DIAGNOSIS — Z992 Dependence on renal dialysis: Secondary | ICD-10-CM | POA: Diagnosis not present

## 2022-07-03 DIAGNOSIS — N186 End stage renal disease: Secondary | ICD-10-CM | POA: Diagnosis not present

## 2022-07-04 ENCOUNTER — Other Ambulatory Visit: Payer: Self-pay | Admitting: Urology

## 2022-07-04 DIAGNOSIS — N138 Other obstructive and reflux uropathy: Secondary | ICD-10-CM

## 2022-07-05 DIAGNOSIS — N186 End stage renal disease: Secondary | ICD-10-CM | POA: Diagnosis not present

## 2022-07-05 DIAGNOSIS — Z992 Dependence on renal dialysis: Secondary | ICD-10-CM | POA: Diagnosis not present

## 2022-07-07 DIAGNOSIS — Z992 Dependence on renal dialysis: Secondary | ICD-10-CM | POA: Diagnosis not present

## 2022-07-07 DIAGNOSIS — N186 End stage renal disease: Secondary | ICD-10-CM | POA: Diagnosis not present

## 2022-07-09 ENCOUNTER — Ambulatory Visit (INDEPENDENT_AMBULATORY_CARE_PROVIDER_SITE_OTHER): Payer: Medicare HMO | Admitting: Nurse Practitioner

## 2022-07-09 ENCOUNTER — Encounter (INDEPENDENT_AMBULATORY_CARE_PROVIDER_SITE_OTHER): Payer: Self-pay | Admitting: Nurse Practitioner

## 2022-07-09 ENCOUNTER — Ambulatory Visit (INDEPENDENT_AMBULATORY_CARE_PROVIDER_SITE_OTHER): Payer: Medicare HMO

## 2022-07-09 DIAGNOSIS — N186 End stage renal disease: Secondary | ICD-10-CM

## 2022-07-09 DIAGNOSIS — N184 Chronic kidney disease, stage 4 (severe): Secondary | ICD-10-CM | POA: Diagnosis not present

## 2022-07-09 DIAGNOSIS — E785 Hyperlipidemia, unspecified: Secondary | ICD-10-CM

## 2022-07-09 DIAGNOSIS — N183 Chronic kidney disease, stage 3 unspecified: Secondary | ICD-10-CM | POA: Diagnosis not present

## 2022-07-09 DIAGNOSIS — I129 Hypertensive chronic kidney disease with stage 1 through stage 4 chronic kidney disease, or unspecified chronic kidney disease: Secondary | ICD-10-CM

## 2022-07-09 DIAGNOSIS — I700 Atherosclerosis of aorta: Secondary | ICD-10-CM | POA: Diagnosis not present

## 2022-07-09 DIAGNOSIS — I10 Essential (primary) hypertension: Secondary | ICD-10-CM | POA: Diagnosis not present

## 2022-07-09 DIAGNOSIS — D631 Anemia in chronic kidney disease: Secondary | ICD-10-CM | POA: Diagnosis not present

## 2022-07-09 DIAGNOSIS — I2510 Atherosclerotic heart disease of native coronary artery without angina pectoris: Secondary | ICD-10-CM | POA: Diagnosis not present

## 2022-07-09 DIAGNOSIS — I4821 Permanent atrial fibrillation: Secondary | ICD-10-CM | POA: Diagnosis not present

## 2022-07-09 DIAGNOSIS — E782 Mixed hyperlipidemia: Secondary | ICD-10-CM | POA: Diagnosis not present

## 2022-07-09 DIAGNOSIS — I5022 Chronic systolic (congestive) heart failure: Secondary | ICD-10-CM | POA: Diagnosis not present

## 2022-07-09 DIAGNOSIS — I4891 Unspecified atrial fibrillation: Secondary | ICD-10-CM | POA: Diagnosis not present

## 2022-07-10 DIAGNOSIS — N186 End stage renal disease: Secondary | ICD-10-CM | POA: Diagnosis not present

## 2022-07-10 DIAGNOSIS — Z992 Dependence on renal dialysis: Secondary | ICD-10-CM | POA: Diagnosis not present

## 2022-07-10 NOTE — Progress Notes (Signed)
Subjective:    Patient ID: Lawrence Acres., male    DOB: Dec 14, 1940, 82 y.o.   MRN: [id number] Chief Complaint  Patient presents with   Follow-up    Follow up 4 week     The patient returns to the office for followup status post intervention of their dialysis access the left brachiocephalic AV fistula on 06/11/2022.   Following the intervention the access function has significantly improved, with better flow rates and improved KT/V. The patient has not been experiencing increased bleeding times following decannulation and the patient denies increased recirculation. The patient denies an increase in arm swelling. At the present time the patient denies hand pain.  No recent shortening of the patient's walking distance or new symptoms consistent with claudication.  No history of rest pain symptoms. No new ulcers or wounds of the lower extremities have occurred.  The patient denies amaurosis fugax or recent TIA symptoms. There are no recent neurological changes noted. There is no history of DVT, PE or superficial thrombophlebitis. No recent episodes of angina or shortness of breath documented.   Duplex ultrasound of the AV access shows a patent access.  The previously noted stenosis is improved compared to last study.  Flow volume today is 1021 cc/min (previous flow volume was 642 cc/min)       Review of Systems  Hematological:  Does not bruise/bleed easily.  All other systems reviewed and are negative.      Objective:   Physical Exam Vitals reviewed.  HENT:     Head: Normocephalic.  Cardiovascular:     Rate and Rhythm: Normal rate.     Pulses:          Radial pulses are 2+ on the left side.     Arteriovenous access: Left arteriovenous access is present.    Comments: Good thrill and bruit Pulmonary:     Effort: Pulmonary effort is normal.  Skin:    General: Skin is warm and dry.  Neurological:     Mental Status: He is alert and oriented to person, place, and time.   Psychiatric:        Mood and Affect: Mood normal.        Behavior: Behavior normal.        Thought Content: Thought content normal.        Judgment: Judgment normal.     BP (!) 146/71 (BP Location: Right Arm)   Pulse 65   Resp 18   Ht 5\' 10"  (1.778 m)   Wt 179 lb 9.6 oz (81.5 kg)   BMI 25.77 kg/m   Past Medical History:  Diagnosis Date   Anemia    Aortic atherosclerosis (HCC)    Arthritis    Atrial fibrillation and flutter (HCC)    a.) CHA2DS2-VASc = 5 (age x 2, CHF, HTN, aortic plaque). b.) rate/rhythm maintain on oral carvedilol; chronically anticoagulated with dose reduced apixaban   Bilateral inguinal hernia    BPH (benign prostatic hyperplasia)    Cardiac syncope    Cardiomyopathy (HCC)    a.) TTE 04/23/2016: EF 40%. b.) TTE 09/03/2016: EF 50%. c.) TTE 05/04/2019: EF >55%.   CHF (congestive heart failure) (HCC)    a.) TTE 04/23/2016: EF 40%; BAE; mild AR/TR, mod MR; G1DD. b.) TTE 09/03/2016: EF 50%; mild LVH; BAE; triv AR/PR, mod MR/TR. c.) TTE 05/04/2019: EF >55%; mod LVH, mild LA enlargement; mild AR/MR/TR   CKD (chronic kidney disease), stage V (HCC)    Complication of anesthesia  a.) h/o intra/postoperative A.fib   Coronary artery disease    Diverticulosis    Gout    History of 2019 novel coronavirus disease (COVID-19) 11/14/2020   History of colon polyps    Hypercholesteremia    Hypertension    Long term current use of anticoagulant    a.) dose reduced apixaban; dose reduction d/t to age and CKD Dx.   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HCC)    a.) s/p resection   MGUS (monoclonal gammopathy of unknown significance)    Pleural effusion    Rectal bleeding    Secondary hyperparathyroidism of renal origin (HCC)     Social History   Socioeconomic History   Marital status: Married    Spouse name: Eber Jones   Number of children: Not on file   Years of education: Not on file   Highest education level: Not on file  Occupational History   Occupation: Retired  Tobacco Use    Smoking status: Former    Years: 15    Types: Cigarettes    Quit date: 01/01/1981    Years since quitting: 41.5   Smokeless tobacco: Never  Vaping Use   Vaping Use: Never used  Substance and Sexual Activity   Alcohol use: Not Currently   Drug use: No   Sexual activity: Yes    Birth control/protection: None  Other Topics Concern   Not on file  Social History Narrative   Regular exercise: Yes   Social Determinants of Health   Financial Resource Strain: Not on file  Food Insecurity: No Food Insecurity (02/02/2022)   Hunger Vital Sign    Worried About Running Out of Food in the Last Year: Never true    Ran Out of Food in the Last Year: Never true  Transportation Needs: No Transportation Needs (02/02/2022)   PRAPARE - Administrator, Civil Service (Medical): No    Lack of Transportation (Non-Medical): No  Physical Activity: Not on file  Stress: Not on file  Social Connections: Not on file  Intimate Partner Violence: Not At Risk (02/02/2022)   Humiliation, Afraid, Rape, and Kick questionnaire    Fear of Current or Ex-Partner: No    Emotionally Abused: No    Physically Abused: No    Sexually Abused: No    Past Surgical History:  Procedure Laterality Date   A/V FISTULAGRAM Left 06/11/2022   Procedure: A/V Fistulagram;  Surgeon: Annice Needy, MD;  Location: [facility];  Service: Cardiovascular;  Laterality: Left;   ANKLE SURGERY Right 1985   following volleyball injury   APPENDECTOMY     at the of 9 yrs   AV FISTULA PLACEMENT Left 01/04/2022   Procedure: ARTERIOVENOUS (AV) FISTULA CREATION;  Surgeon: Annice Needy, MD;  Location: [facility];  Service: Vascular;  Laterality: Left;   CAPD INSERTION N/A 08/10/2021   Procedure: LAPAROSCOPIC INSERTION CONTINUOUS AMBULATORY PERITONEAL DIALYSIS  (CAPD) CATHETER;  Surgeon: Leafy Ro, MD;  Location: [facility];  Service: General;  Laterality: N/A;   CAPD REVISION N/A 09/21/2021   Procedure: LAPAROSCOPIC REVISION  CONTINUOUS AMBULATORY PERITONEAL DIALYSIS  (CAPD) CATHETER;  Surgeon: Leafy Ro, MD;  Location: [facility];  Service: General;  Laterality: N/A;   CATARACT EXTRACTION Left    COLONOSCOPY     COLONOSCOPY WITH PROPOFOL N/A 01/02/2017   Procedure: COLONOSCOPY WITH PROPOFOL;  Surgeon: Scot Jun, MD;  Location: [facility];  Service: Endoscopy;  Laterality: N/A;   DIALYSIS/PERMA CATHETER INSERTION N/A 03/27/2021  Procedure: DIALYSIS/PERMA CATHETER INSERTION;  Surgeon: Annice Needy, MD;  Location: [facility];  Service: Cardiovascular;  Laterality: N/A;   DIALYSIS/PERMA CATHETER INSERTION N/A 10/10/2021   Procedure: DIALYSIS/PERMA CATHETER INSERTION;  Surgeon: Renford Dills, MD;  Location: [facility];  Service: Cardiovascular;  Laterality: N/A;   DIALYSIS/PERMA CATHETER REMOVAL N/A 05/08/2021   Procedure: DIALYSIS/PERMA CATHETER REMOVAL;  Surgeon: Annice Needy, MD;  Location: [facility];  Service: Cardiovascular;  Laterality: N/A;   DIALYSIS/PERMA CATHETER REMOVAL N/A 04/17/2022   Procedure: DIALYSIS/PERMA CATHETER REMOVAL;  Surgeon: Renford Dills, MD;  Location: [facility];  Service: Cardiovascular;  Laterality: N/A;   INSERTION OF MESH  08/10/2021   Procedure: INSERTION OF MESH;  Surgeon: Leafy Ro, MD;  Location: [facility];  Service: General;;   JOINT REPLACEMENT     RIGHT TOTAL HIP   KNEE ARTHROSCOPY WITH LATERAL RELEASE Right 04/20/2021   Procedure: Right knee medial retinaculum and lateral release with polyethylene exchange;  Surgeon: Kennedy Bucker, MD;  Location: [facility];  Service: Orthopedics;  Laterality: Right;   TONSILLECTOMY     as a child   TOTAL HIP ARTHROPLASTY  09/2009   Right femoral neck fracture   TOTAL HIP ARTHROPLASTY Left 03/06/2016   Procedure: TOTAL HIP ARTHROPLASTY ANTERIOR APPROACH;  Surgeon: Kennedy Bucker, MD;  Location: [facility];  Service: Orthopedics;  Laterality: Left;   TOTAL KNEE ARTHROPLASTY Right  03/21/2021   Procedure: TOTAL KNEE ARTHROPLASTY;  Surgeon: Kennedy Bucker, MD;  Location: [facility];  Service: Orthopedics;  Laterality: Right;   UMBILICAL HERNIA REPAIR  08/10/2021   Procedure: HERNIA REPAIR UMBILICAL ADULT;  Surgeon: Leafy Ro, MD;  Location: [facility];  Service: General;;    Family History  Problem Relation Age of Onset   Cirrhosis Mother    Other Father        Lung Fibrosis   Prostate cancer Neg Hx    Kidney cancer Neg Hx    Bladder Cancer Neg Hx     No Known Allergies     Latest Ref Rng & Units 02/03/2022    9:29 AM 02/02/2022   10:06 AM 01/31/2022    5:23 PM  CBC  WBC 4.0 - 10.5 K/uL 6.1  6.3  7.4   Hemoglobin 13.0 - 17.0 g/dL 9.0  9.3  19.1   Hematocrit 39.0 - 52.0 % 25.8  26.9  30.8   Platelets 150 - 400 K/uL 132  128  139       CMP     Component Value Date/Time   NA 131 (L) 02/03/2022 0459   NA 139 02/04/2014 1449   K 3.1 (L) 02/03/2022 0459   K 4.6 02/04/2014 1449   CL 91 (L) 02/03/2022 0459   CL 105 02/04/2014 1449   CO2 25 02/03/2022 0459   CO2 27 02/04/2014 1449   GLUCOSE 95 02/03/2022 0459   GLUCOSE 84 02/04/2014 1449   BUN 91 (H) 02/03/2022 0459   BUN 23 (H) 02/04/2014 1449   CREATININE 6.64 (H) 02/03/2022 0459   CREATININE 1.25 02/04/2014 1449   CALCIUM 8.3 (L) 02/03/2022 0459   CALCIUM 8.7 02/04/2014 1449   PROT 7.4 01/31/2022 1723   ALBUMIN 3.8 01/31/2022 1723   AST 14 (L) 01/31/2022 1723   ALT 12 01/31/2022 1723   ALKPHOS 77 01/31/2022 1723   BILITOT 1.4 (H) 01/31/2022 1723   GFRNONAA 8 (L) 02/03/2022 0459   GFRNONAA >60 02/04/2014 1449  No results found.     Assessment & Plan:   1. ESRD (end stage renal disease) (HCC) Recommend:  The patient is doing well and currently has adequate dialysis access. The patient's dialysis center is not reporting any access issues. Flow pattern is stable when compared to the prior ultrasound.  The patient should have a duplex ultrasound of the dialysis access in 6  months. The patient will follow-up with me in the office after each ultrasound    2. Hyperlipidemia, unspecified hyperlipidemia type Continue statin as ordered and reviewed, no changes at this time  3. Benign hypertension with chronic kidney disease, stage III (HCC) Continue antihypertensive medications as already ordered, these medications have been reviewed and there are no changes at this time.   Current Outpatient Medications on File Prior to Visit  Medication Sig Dispense Refill   apixaban (ELIQUIS) 2.5 MG TABS tablet Take 2.5 mg by mouth 2 (two) times daily.     B Complex Vitamins (B-COMPLEX/B-12 PO) Take 1,000 mcg by mouth.     calcitRIOL (ROCALTROL) 0.25 MCG capsule Take 0.25 mcg by mouth daily.     carvedilol (COREG) 3.125 MG tablet Take 6.25 mg by mouth daily.     cholecalciferol (VITAMIN D3) 25 MCG (1000 UNIT) tablet Take 1,000 Units by mouth daily.     cyanocobalamin (VITAMIN B12) 1000 MCG tablet Take 2,500 mcg by mouth daily.     Multiple Vitamin (MULTIVITAMIN WITH MINERALS) TABS tablet Take 1 tablet by mouth daily.     rosuvastatin (CRESTOR) 10 MG tablet Take 10 mg by mouth at bedtime.     Saccharomyces boulardii (PROBIOTIC) 250 MG CAPS Take 1 capsule by mouth in the morning and at bedtime. 12 capsule 0   sevelamer carbonate (RENVELA) 800 MG tablet Take 1,600 mg by mouth 3 (three) times daily.     torsemide (DEMADEX) 20 MG tablet Take 2 tablets (40 mg total) by mouth daily. 60 tablet 0   traZODone (DESYREL) 50 MG tablet Take 50 mg by mouth at bedtime as needed for sleep.     No current facility-administered medications on file prior to visit.    There are no Patient Instructions on file for this visit. No follow-ups on file.   Georgiana Spinner, NP

## 2022-07-12 ENCOUNTER — Other Ambulatory Visit: Payer: Medicare HMO | Admitting: Urology

## 2022-07-12 DIAGNOSIS — N186 End stage renal disease: Secondary | ICD-10-CM | POA: Diagnosis not present

## 2022-07-12 DIAGNOSIS — Z992 Dependence on renal dialysis: Secondary | ICD-10-CM | POA: Diagnosis not present

## 2022-07-14 DIAGNOSIS — Z992 Dependence on renal dialysis: Secondary | ICD-10-CM | POA: Diagnosis not present

## 2022-07-14 DIAGNOSIS — N186 End stage renal disease: Secondary | ICD-10-CM | POA: Diagnosis not present

## 2022-07-17 DIAGNOSIS — Z992 Dependence on renal dialysis: Secondary | ICD-10-CM | POA: Diagnosis not present

## 2022-07-17 DIAGNOSIS — N186 End stage renal disease: Secondary | ICD-10-CM | POA: Diagnosis not present

## 2022-07-19 DIAGNOSIS — Z992 Dependence on renal dialysis: Secondary | ICD-10-CM | POA: Diagnosis not present

## 2022-07-19 DIAGNOSIS — N186 End stage renal disease: Secondary | ICD-10-CM | POA: Diagnosis not present

## 2022-07-21 DIAGNOSIS — Z992 Dependence on renal dialysis: Secondary | ICD-10-CM | POA: Diagnosis not present

## 2022-07-21 DIAGNOSIS — N186 End stage renal disease: Secondary | ICD-10-CM | POA: Diagnosis not present

## 2022-07-24 DIAGNOSIS — Z992 Dependence on renal dialysis: Secondary | ICD-10-CM | POA: Diagnosis not present

## 2022-07-24 DIAGNOSIS — N186 End stage renal disease: Secondary | ICD-10-CM | POA: Diagnosis not present

## 2022-07-26 DIAGNOSIS — N186 End stage renal disease: Secondary | ICD-10-CM | POA: Diagnosis not present

## 2022-07-26 DIAGNOSIS — Z992 Dependence on renal dialysis: Secondary | ICD-10-CM | POA: Diagnosis not present

## 2022-07-28 DIAGNOSIS — N186 End stage renal disease: Secondary | ICD-10-CM | POA: Diagnosis not present

## 2022-07-28 DIAGNOSIS — Z992 Dependence on renal dialysis: Secondary | ICD-10-CM | POA: Diagnosis not present

## 2022-07-31 DIAGNOSIS — Z992 Dependence on renal dialysis: Secondary | ICD-10-CM | POA: Diagnosis not present

## 2022-07-31 DIAGNOSIS — N186 End stage renal disease: Secondary | ICD-10-CM | POA: Diagnosis not present

## 2022-08-01 DIAGNOSIS — Z992 Dependence on renal dialysis: Secondary | ICD-10-CM | POA: Diagnosis not present

## 2022-08-01 DIAGNOSIS — N186 End stage renal disease: Secondary | ICD-10-CM | POA: Diagnosis not present

## 2022-08-02 DIAGNOSIS — Z992 Dependence on renal dialysis: Secondary | ICD-10-CM | POA: Diagnosis not present

## 2022-08-02 DIAGNOSIS — N186 End stage renal disease: Secondary | ICD-10-CM | POA: Diagnosis not present

## 2022-08-04 DIAGNOSIS — Z992 Dependence on renal dialysis: Secondary | ICD-10-CM | POA: Diagnosis not present

## 2022-08-04 DIAGNOSIS — N186 End stage renal disease: Secondary | ICD-10-CM | POA: Diagnosis not present

## 2022-08-07 DIAGNOSIS — N186 End stage renal disease: Secondary | ICD-10-CM | POA: Diagnosis not present

## 2022-08-07 DIAGNOSIS — Z992 Dependence on renal dialysis: Secondary | ICD-10-CM | POA: Diagnosis not present

## 2022-08-09 DIAGNOSIS — N186 End stage renal disease: Secondary | ICD-10-CM | POA: Diagnosis not present

## 2022-08-09 DIAGNOSIS — Z992 Dependence on renal dialysis: Secondary | ICD-10-CM | POA: Diagnosis not present

## 2022-08-10 ENCOUNTER — Encounter (INDEPENDENT_AMBULATORY_CARE_PROVIDER_SITE_OTHER): Payer: Medicare HMO

## 2022-08-10 ENCOUNTER — Ambulatory Visit (INDEPENDENT_AMBULATORY_CARE_PROVIDER_SITE_OTHER): Payer: Medicare HMO | Admitting: Vascular Surgery

## 2022-08-11 DIAGNOSIS — Z992 Dependence on renal dialysis: Secondary | ICD-10-CM | POA: Diagnosis not present

## 2022-08-11 DIAGNOSIS — N186 End stage renal disease: Secondary | ICD-10-CM | POA: Diagnosis not present

## 2022-08-14 DIAGNOSIS — Z992 Dependence on renal dialysis: Secondary | ICD-10-CM | POA: Diagnosis not present

## 2022-08-14 DIAGNOSIS — N186 End stage renal disease: Secondary | ICD-10-CM | POA: Diagnosis not present

## 2022-08-16 DIAGNOSIS — Z992 Dependence on renal dialysis: Secondary | ICD-10-CM | POA: Diagnosis not present

## 2022-08-16 DIAGNOSIS — N186 End stage renal disease: Secondary | ICD-10-CM | POA: Diagnosis not present

## 2022-08-17 ENCOUNTER — Ambulatory Visit: Payer: Medicare HMO | Admitting: Medical Oncology

## 2022-08-17 ENCOUNTER — Other Ambulatory Visit: Payer: Medicare HMO

## 2022-08-18 DIAGNOSIS — Z992 Dependence on renal dialysis: Secondary | ICD-10-CM | POA: Diagnosis not present

## 2022-08-18 DIAGNOSIS — N186 End stage renal disease: Secondary | ICD-10-CM | POA: Diagnosis not present

## 2022-08-20 ENCOUNTER — Inpatient Hospital Stay: Payer: Medicare HMO | Attending: Oncology

## 2022-08-20 ENCOUNTER — Inpatient Hospital Stay: Payer: Medicare HMO | Admitting: Nurse Practitioner

## 2022-08-20 ENCOUNTER — Ambulatory Visit: Payer: Medicare HMO | Admitting: Medical Oncology

## 2022-08-20 VITALS — BP 123/73 | HR 50 | Temp 97.8°F | Wt 181.0 lb

## 2022-08-20 DIAGNOSIS — N186 End stage renal disease: Secondary | ICD-10-CM | POA: Diagnosis not present

## 2022-08-20 DIAGNOSIS — D472 Monoclonal gammopathy: Secondary | ICD-10-CM | POA: Insufficient documentation

## 2022-08-20 DIAGNOSIS — Z87891 Personal history of nicotine dependence: Secondary | ICD-10-CM | POA: Diagnosis not present

## 2022-08-20 DIAGNOSIS — Z992 Dependence on renal dialysis: Secondary | ICD-10-CM | POA: Diagnosis not present

## 2022-08-20 DIAGNOSIS — D649 Anemia, unspecified: Secondary | ICD-10-CM | POA: Diagnosis not present

## 2022-08-20 LAB — CBC WITH DIFFERENTIAL/PLATELET
Abs Immature Granulocytes: 0.01 10*3/uL (ref 0.00–0.07)
Basophils Absolute: 0 10*3/uL (ref 0.0–0.1)
Basophils Relative: 0 %
Eosinophils Absolute: 0.2 10*3/uL (ref 0.0–0.5)
Eosinophils Relative: 3 %
HCT: 31.3 % — ABNORMAL LOW (ref 39.0–52.0)
Hemoglobin: 10.6 g/dL — ABNORMAL LOW (ref 13.0–17.0)
Immature Granulocytes: 0 %
Lymphocytes Relative: 12 %
Lymphs Abs: 0.7 10*3/uL (ref 0.7–4.0)
MCH: 35 pg — ABNORMAL HIGH (ref 26.0–34.0)
MCHC: 33.9 g/dL (ref 30.0–36.0)
MCV: 103.3 fL — ABNORMAL HIGH (ref 80.0–100.0)
Monocytes Absolute: 0.4 10*3/uL (ref 0.1–1.0)
Monocytes Relative: 7 %
Neutro Abs: 4.6 10*3/uL (ref 1.7–7.7)
Neutrophils Relative %: 78 %
Platelets: 188 10*3/uL (ref 150–400)
RBC: 3.03 MIL/uL — ABNORMAL LOW (ref 4.22–5.81)
RDW: 13.8 % (ref 11.5–15.5)
WBC: 5.9 10*3/uL (ref 4.0–10.5)
nRBC: 0 % (ref 0.0–0.2)

## 2022-08-20 LAB — BASIC METABOLIC PANEL
Anion gap: 10 (ref 5–15)
BUN: 52 mg/dL — ABNORMAL HIGH (ref 8–23)
CO2: 30 mmol/L (ref 22–32)
Calcium: 9.4 mg/dL (ref 8.9–10.3)
Chloride: 91 mmol/L — ABNORMAL LOW (ref 98–111)
Creatinine, Ser: 6.06 mg/dL — ABNORMAL HIGH (ref 0.61–1.24)
GFR, Estimated: 9 mL/min — ABNORMAL LOW (ref >60)
Glucose, Bld: 99 mg/dL (ref 70–99)
Potassium: 4.4 mmol/L (ref 3.5–5.1)
Sodium: 131 mmol/L — ABNORMAL LOW (ref 135–145)

## 2022-08-20 NOTE — Progress Notes (Unsigned)
[facility]  Telephone:([phone] [phone]  ID: [name]. OB: October 31, 1940  MR#: [id number]  QIO#:[id number]  Patient Care Team: Lauro Regulus, MD as PCP - General (Internal Medicine)  CHIEF COMPLAINT: MGUS.  INTERVAL HISTORY: Patient returns to clinic for repeat laboratory work and further evaluation. 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 He feels well and otherwise denies complaints. He has no neurologic complaints.  He denies any pain. He has a good appetite and denies weight loss.  He denies any chest pain, shortness of breath, cough, or hemoptysis.  He denies any nausea, vomiting, constipation, or diarrhea. He has no urinary complaints.  Patient offers no further specific complaints today.  REVIEW OF SYSTEMS:   Review of Systems  Constitutional: Negative.  Negative for fever, malaise/fatigue and weight loss.  Respiratory: Negative.  Negative for cough and shortness of breath.   Cardiovascular: Negative.  Negative for chest pain and leg swelling.  Gastrointestinal: Negative.  Negative for abdominal pain.  Genitourinary: Negative.  Negative for dysuria.  Musculoskeletal: Negative.  Negative for back pain.  Skin: Negative.  Negative for rash.  Neurological: Negative.  Negative for focal weakness, weakness and headaches.  Endo/Heme/Allergies:  Does not bruise/bleed easily.  Psychiatric/Behavioral: Negative.  The patient is not nervous/anxious.   As per HPI. Otherwise, a complete review of systems is negative.  PAST MEDICAL HISTORY: Past Medical History:  Diagnosis Date   Anemia    Aortic atherosclerosis (HCC)    Arthritis    Atrial fibrillation and flutter (HCC)    a.) CHA2DS2-VASc = 5 (age x 2, CHF, HTN, aortic plaque). b.) rate/rhythm maintain on oral carvedilol; chronically anticoagulated with dose reduced apixaban   Bilateral inguinal hernia    BPH (benign prostatic hyperplasia)    Cardiac syncope     Cardiomyopathy (HCC)    a.) TTE 04/23/2016: EF 40%. b.) TTE 09/03/2016: EF 50%. c.) TTE 05/04/2019: EF >55%.   CHF (congestive heart failure) (HCC)    a.) TTE 04/23/2016: EF 40%; BAE; mild AR/TR, mod MR; G1DD. b.) TTE 09/03/2016: EF 50%; mild LVH; BAE; triv AR/PR, mod MR/TR. c.) TTE 05/04/2019: EF >55%; mod LVH, mild LA enlargement; mild AR/MR/TR   CKD (chronic kidney disease), stage V (HCC)    Complication of anesthesia    a.) h/o intra/postoperative A.fib   Coronary artery disease    Diverticulosis    Gout    History of 2019 novel coronavirus disease (COVID-19) 11/14/2020   History of colon polyps    Hypercholesteremia    Hypertension    Long term current use of anticoagulant    a.) dose reduced apixaban; dose reduction d/t to age and CKD Dx.   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HCC)    a.) s/p resection   MGUS (monoclonal gammopathy of unknown significance)    Pleural effusion    Rectal bleeding    Secondary hyperparathyroidism of renal origin [facility])     PAST SURGICAL HISTORY: Past Surgical History:  Procedure Laterality Date   A/V FISTULAGRAM Left 06/11/2022   Procedure: A/V Fistulagram;  Surgeon: Annice Needy, MD;  Location: [facility] INVASIVE CV LAB;  Service: Cardiovascular;  Laterality: Left;   ANKLE SURGERY Right 1985   following volleyball injury   APPENDECTOMY     at the of 9 yrs   AV FISTULA PLACEMENT Left 01/04/2022   Procedure: ARTERIOVENOUS (AV) FISTULA CREATION;  Surgeon: Annice Needy, MD;  Location: [facility] ORS;  Service: Vascular;  Laterality: Left;   CAPD  INSERTION N/A 08/10/2021   Procedure: LAPAROSCOPIC INSERTION CONTINUOUS AMBULATORY PERITONEAL DIALYSIS  (CAPD) CATHETER;  Surgeon: Leafy Ro, MD;  Location: [facility] ORS;  Service: General;  Laterality: N/A;   CAPD REVISION N/A 09/21/2021   Procedure: LAPAROSCOPIC REVISION CONTINUOUS AMBULATORY PERITONEAL DIALYSIS  (CAPD) CATHETER;  Surgeon: Leafy Ro, MD;  Location: [facility] ORS;  Service: General;  Laterality: N/A;   CATARACT  EXTRACTION Left    COLONOSCOPY     COLONOSCOPY WITH PROPOFOL N/A 01/02/2017   Procedure: COLONOSCOPY WITH PROPOFOL;  Surgeon: Scot Jun, MD;  Location: [facility] ENDOSCOPY;  Service: Endoscopy;  Laterality: N/A;   DIALYSIS/PERMA CATHETER INSERTION N/A 03/27/2021   Procedure: DIALYSIS/PERMA CATHETER INSERTION;  Surgeon: Annice Needy, MD;  Location: [facility];  Service: Cardiovascular;  Laterality: N/A;   DIALYSIS/PERMA CATHETER INSERTION N/A 10/10/2021   Procedure: DIALYSIS/PERMA CATHETER INSERTION;  Surgeon: Renford Dills, MD;  Location: [facility];  Service: Cardiovascular;  Laterality: N/A;   DIALYSIS/PERMA CATHETER REMOVAL N/A 05/08/2021   Procedure: DIALYSIS/PERMA CATHETER REMOVAL;  Surgeon: Annice Needy, MD;  Location: [facility];  Service: Cardiovascular;  Laterality: N/A;   DIALYSIS/PERMA CATHETER REMOVAL N/A 04/17/2022   Procedure: DIALYSIS/PERMA CATHETER REMOVAL;  Surgeon: Renford Dills, MD;  Location: [facility];  Service: Cardiovascular;  Laterality: N/A;   INSERTION OF MESH  08/10/2021   Procedure: INSERTION OF MESH;  Surgeon: Leafy Ro, MD;  Location: [facility] ORS;  Service: General;;   JOINT REPLACEMENT     RIGHT TOTAL HIP   KNEE ARTHROSCOPY WITH LATERAL RELEASE Right 04/20/2021   Procedure: Right knee medial retinaculum and lateral release with polyethylene exchange;  Surgeon: Kennedy Bucker, MD;  Location: [facility] ORS;  Service: Orthopedics;  Laterality: Right;   TONSILLECTOMY     as a child   TOTAL HIP ARTHROPLASTY  09/2009   Right femoral neck fracture   TOTAL HIP ARTHROPLASTY Left 03/06/2016   Procedure: TOTAL HIP ARTHROPLASTY ANTERIOR APPROACH;  Surgeon: Kennedy Bucker, MD;  Location: [facility] ORS;  Service: Orthopedics;  Laterality: Left;   TOTAL KNEE ARTHROPLASTY Right 03/21/2021   Procedure: TOTAL KNEE ARTHROPLASTY;  Surgeon: Kennedy Bucker, MD;  Location: [facility] ORS;  Service: Orthopedics;  Laterality: Right;   UMBILICAL  HERNIA REPAIR  08/10/2021   Procedure: HERNIA REPAIR UMBILICAL ADULT;  Surgeon: Leafy Ro, MD;  Location: [facility] ORS;  Service: General;;    FAMILY HISTORY: Family History  Problem Relation Age of Onset   Cirrhosis Mother    Other Father        Lung Fibrosis   Prostate cancer Neg Hx    Kidney cancer Neg Hx    Bladder Cancer Neg Hx     ADVANCED DIRECTIVES (Y/N):  N  HEALTH MAINTENANCE: Social History   Tobacco Use   Smoking status: Former    Current packs/day: 0.00    Types: Cigarettes    Start date: 01/01/1966    Quit date: 01/01/1981    Years since quitting: 41.6   Smokeless tobacco: Never  Vaping Use   Vaping status: Never Used  Substance Use Topics   Alcohol use: Not Currently   Drug use: No    Colonoscopy:  Bone density:  Lipid panel:  No Known Allergies  Current Outpatient Medications  Medication Sig Dispense Refill   apixaban (ELIQUIS) 2.5 MG TABS tablet Take 2.5 mg by mouth 2 (two) times daily.     B Complex Vitamins (B-COMPLEX/B-12 PO) Take 1,000 mcg by mouth.  calcitRIOL (ROCALTROL) 0.25 MCG capsule Take 0.25 mcg by mouth daily.     carvedilol (COREG) 3.125 MG tablet Take 6.25 mg by mouth daily.     cholecalciferol (VITAMIN D3) 25 MCG (1000 UNIT) tablet Take 1,000 Units by mouth daily.     cyanocobalamin (VITAMIN B12) 1000 MCG tablet Take 2,500 mcg by mouth daily.     Multiple Vitamin (MULTIVITAMIN WITH MINERALS) TABS tablet Take 1 tablet by mouth daily.     rosuvastatin (CRESTOR) 10 MG tablet Take 10 mg by mouth at bedtime.     Saccharomyces boulardii (PROBIOTIC) 250 MG CAPS Take 1 capsule by mouth in the morning and at bedtime. 12 capsule 0   sevelamer carbonate (RENVELA) 800 MG tablet Take 1,600 mg by mouth 3 (three) times daily.     torsemide (DEMADEX) 20 MG tablet Take 2 tablets (40 mg total) by mouth daily. 60 tablet 0   traZODone (DESYREL) 50 MG tablet Take 50 mg by mouth at bedtime as needed for sleep.     No current facility-administered  medications for this visit.    OBJECTIVE: Vitals:   08/20/22 1010  BP: 123/73  Pulse: (!) 50  Temp: 97.8 F (36.6 C)     Body mass index is 25.97 kg/m.    ECOG FS:0 - Asymptomatic  General: Well-developed, well-nourished, no acute distress. Eyes: Pink conjunctiva, anicteric sclera. Lungs:  No audible wheezing or coughing Abdomen: nondistended Musculoskeletal: No edema, cyanosis, or clubbing. Neuro: Alert, answering all questions appropriately.  Skin: No rashes or petechiae noted. Psych: Normal affect.   LAB RESULTS: Lab Results  Component Value Date   NA 131 (L) 08/20/2022   K 4.4 08/20/2022   CL 91 (L) 08/20/2022   CO2 30 08/20/2022   GLUCOSE 99 08/20/2022   BUN 52 (H) 08/20/2022   CREATININE 6.06 (H) 08/20/2022   CALCIUM 9.4 08/20/2022   PROT 7.4 01/31/2022   ALBUMIN 3.8 01/31/2022   AST 14 (L) 01/31/2022   ALT 12 01/31/2022   ALKPHOS 77 01/31/2022   BILITOT 1.4 (H) 01/31/2022   GFRNONAA 9 (L) 08/20/2022   GFRAA 22 (L) 09/15/2019   Lab Results  Component Value Date   WBC 5.9 08/20/2022   NEUTROABS 4.6 08/20/2022   HGB 10.6 (L) 08/20/2022   HCT 31.3 (L) 08/20/2022   MCV 103.3 (H) 08/20/2022   PLT 188 08/20/2022   Lab Results  Component Value Date   TOTALPROTELP 6.2 08/09/2021   ALBUMINELP 3.8 08/09/2021   A1GS 0.2 08/09/2021   A2GS 0.6 08/09/2021   BETS 0.9 08/09/2021   GAMS 0.8 08/09/2021   MSPIKE 0.3 (H) 08/09/2021   SPEI Comment 08/09/2021   STUDIES: No results found.  ASSESSMENT: MGUS  PLAN:    1. MGUS: Patient had a bone marrow biopsy on November 03, 2015 that revealed only a mild increased plasma cell population of approximately 5-10%. Patient was also noted to have cytogenetic abnormality of a monosomy 33 which is an average risk of multiple myeloma. Patient's M spike has ranged between 0.3 and 0.6 since January 2018. Today's result is pending. His IgA is elevated but mildly at 449. IgG and IgM are normal. Kappa and lambda free light chains  are elevated but ratio remains normal. He is clinically asymptomatic of progression to multiple myeloma. I suspect anemia is related to his ESRD and not his MGUS. Would consider bone survey in the future if symptomatic or otherwise suspicion of progression of disease. No intervention needed at this time. Return to  clinic in 1 year with repeat laboratory work and routine evaluation.    2.  End-stage renal disease: Now on dialysis.  Unrelated to underlying MGUS. He continues dialysis T-Th-Sa.   3.  Anemia: Hemoglobin is stable at 10.6. Now managed by dialysis. Again reviewed that most likely related to his kidney disease and less likely his MGUS.   Disposition:  1 year- labs (cbc, cmp, spep, immunoglobulins, kappa lambda light chains) 2 weeks later- see me or Dr Orlie Dakin for follow up- la  Patient expressed understanding and was in agreement with this plan. He also understands that He can call clinic at any time with any questions, concerns, or complaints.   Alinda Dooms, NP  08/20/2022

## 2022-08-20 NOTE — Progress Notes (Unsigned)
Patient says that he is doing well.

## 2022-08-21 DIAGNOSIS — N186 End stage renal disease: Secondary | ICD-10-CM | POA: Diagnosis not present

## 2022-08-21 DIAGNOSIS — Z992 Dependence on renal dialysis: Secondary | ICD-10-CM | POA: Diagnosis not present

## 2022-08-21 LAB — IGG, IGA, IGM
IgA: 449 mg/dL — ABNORMAL HIGH (ref 61–437)
IgG (Immunoglobin G), Serum: 917 mg/dL (ref 603–1613)
IgM (Immunoglobulin M), Srm: 37 mg/dL (ref 15–143)

## 2022-08-21 LAB — KAPPA/LAMBDA LIGHT CHAINS
Kappa free light chain: 102 mg/L — ABNORMAL HIGH (ref 3.3–19.4)
Kappa, lambda light chain ratio: 0.31 (ref 0.26–1.65)
Lambda free light chains: 331.8 mg/L — ABNORMAL HIGH (ref 5.7–26.3)

## 2022-08-23 DIAGNOSIS — Z992 Dependence on renal dialysis: Secondary | ICD-10-CM | POA: Diagnosis not present

## 2022-08-23 DIAGNOSIS — N186 End stage renal disease: Secondary | ICD-10-CM | POA: Diagnosis not present

## 2022-08-23 LAB — PROTEIN ELECTROPHORESIS, SERUM
A/G Ratio: 1.4 (ref 0.7–1.7)
Albumin ELP: 3.7 g/dL (ref 2.9–4.4)
Alpha-1-Globulin: 0.2 g/dL (ref 0.0–0.4)
Alpha-2-Globulin: 0.6 g/dL (ref 0.4–1.0)
Beta Globulin: 0.9 g/dL (ref 0.7–1.3)
Gamma Globulin: 0.9 g/dL (ref 0.4–1.8)
Globulin, Total: 2.7 g/dL (ref 2.2–3.9)
M-Spike, %: 0.5 g/dL — ABNORMAL HIGH
Total Protein ELP: 6.4 g/dL (ref 6.0–8.5)

## 2022-08-27 DIAGNOSIS — N186 End stage renal disease: Secondary | ICD-10-CM | POA: Diagnosis not present

## 2022-08-27 DIAGNOSIS — Z992 Dependence on renal dialysis: Secondary | ICD-10-CM | POA: Diagnosis not present

## 2022-08-30 DIAGNOSIS — Z992 Dependence on renal dialysis: Secondary | ICD-10-CM | POA: Diagnosis not present

## 2022-08-30 DIAGNOSIS — N186 End stage renal disease: Secondary | ICD-10-CM | POA: Diagnosis not present

## 2022-09-01 DIAGNOSIS — Z992 Dependence on renal dialysis: Secondary | ICD-10-CM | POA: Diagnosis not present

## 2022-09-01 DIAGNOSIS — N186 End stage renal disease: Secondary | ICD-10-CM | POA: Diagnosis not present

## 2022-09-04 DIAGNOSIS — N186 End stage renal disease: Secondary | ICD-10-CM | POA: Diagnosis not present

## 2022-09-04 DIAGNOSIS — Z992 Dependence on renal dialysis: Secondary | ICD-10-CM | POA: Diagnosis not present

## 2022-09-06 DIAGNOSIS — N186 End stage renal disease: Secondary | ICD-10-CM | POA: Diagnosis not present

## 2022-09-06 DIAGNOSIS — Z992 Dependence on renal dialysis: Secondary | ICD-10-CM | POA: Diagnosis not present

## 2022-09-08 DIAGNOSIS — Z992 Dependence on renal dialysis: Secondary | ICD-10-CM | POA: Diagnosis not present

## 2022-09-08 DIAGNOSIS — N186 End stage renal disease: Secondary | ICD-10-CM | POA: Diagnosis not present

## 2022-09-11 DIAGNOSIS — Z992 Dependence on renal dialysis: Secondary | ICD-10-CM | POA: Diagnosis not present

## 2022-09-11 DIAGNOSIS — N186 End stage renal disease: Secondary | ICD-10-CM | POA: Diagnosis not present

## 2022-09-13 DIAGNOSIS — Z992 Dependence on renal dialysis: Secondary | ICD-10-CM | POA: Diagnosis not present

## 2022-09-13 DIAGNOSIS — N186 End stage renal disease: Secondary | ICD-10-CM | POA: Diagnosis not present

## 2022-09-15 DIAGNOSIS — N186 End stage renal disease: Secondary | ICD-10-CM | POA: Diagnosis not present

## 2022-09-15 DIAGNOSIS — Z992 Dependence on renal dialysis: Secondary | ICD-10-CM | POA: Diagnosis not present

## 2022-09-18 DIAGNOSIS — N186 End stage renal disease: Secondary | ICD-10-CM | POA: Diagnosis not present

## 2022-09-18 DIAGNOSIS — Z992 Dependence on renal dialysis: Secondary | ICD-10-CM | POA: Diagnosis not present

## 2022-09-20 DIAGNOSIS — N186 End stage renal disease: Secondary | ICD-10-CM | POA: Diagnosis not present

## 2022-09-20 DIAGNOSIS — Z992 Dependence on renal dialysis: Secondary | ICD-10-CM | POA: Diagnosis not present

## 2022-09-22 DIAGNOSIS — Z992 Dependence on renal dialysis: Secondary | ICD-10-CM | POA: Diagnosis not present

## 2022-09-22 DIAGNOSIS — N186 End stage renal disease: Secondary | ICD-10-CM | POA: Diagnosis not present

## 2022-09-24 ENCOUNTER — Other Ambulatory Visit (INDEPENDENT_AMBULATORY_CARE_PROVIDER_SITE_OTHER): Payer: Self-pay | Admitting: Nurse Practitioner

## 2022-09-24 DIAGNOSIS — T829XXS Unspecified complication of cardiac and vascular prosthetic device, implant and graft, sequela: Secondary | ICD-10-CM

## 2022-09-24 DIAGNOSIS — N186 End stage renal disease: Secondary | ICD-10-CM

## 2022-09-25 DIAGNOSIS — N186 End stage renal disease: Secondary | ICD-10-CM | POA: Diagnosis not present

## 2022-09-25 DIAGNOSIS — Z992 Dependence on renal dialysis: Secondary | ICD-10-CM | POA: Diagnosis not present

## 2022-09-26 ENCOUNTER — Ambulatory Visit (INDEPENDENT_AMBULATORY_CARE_PROVIDER_SITE_OTHER): Payer: Medicare HMO

## 2022-09-26 DIAGNOSIS — N186 End stage renal disease: Secondary | ICD-10-CM | POA: Diagnosis not present

## 2022-09-26 DIAGNOSIS — T829XXS Unspecified complication of cardiac and vascular prosthetic device, implant and graft, sequela: Secondary | ICD-10-CM

## 2022-09-27 DIAGNOSIS — N186 End stage renal disease: Secondary | ICD-10-CM | POA: Diagnosis not present

## 2022-09-27 DIAGNOSIS — Z992 Dependence on renal dialysis: Secondary | ICD-10-CM | POA: Diagnosis not present

## 2022-09-28 ENCOUNTER — Encounter (INDEPENDENT_AMBULATORY_CARE_PROVIDER_SITE_OTHER): Payer: Self-pay | Admitting: Nurse Practitioner

## 2022-09-28 ENCOUNTER — Ambulatory Visit (INDEPENDENT_AMBULATORY_CARE_PROVIDER_SITE_OTHER): Payer: Medicare HMO | Admitting: Nurse Practitioner

## 2022-09-28 ENCOUNTER — Telehealth (INDEPENDENT_AMBULATORY_CARE_PROVIDER_SITE_OTHER): Payer: Self-pay

## 2022-09-28 VITALS — BP 133/72 | HR 51 | Resp 16 | Wt 181.0 lb

## 2022-09-28 DIAGNOSIS — E785 Hyperlipidemia, unspecified: Secondary | ICD-10-CM

## 2022-09-28 DIAGNOSIS — N186 End stage renal disease: Secondary | ICD-10-CM

## 2022-09-28 NOTE — H&P (View-Only) (Signed)
Subjective:    Patient ID: Lawrence Acres., male    DOB: 02/01/1940, 82 y.o.   MRN: [id number] Chief Complaint  Patient presents with   Follow-up    Ref [facility] consult bleeding/ fistula    The patient returns to the office for follow up regarding a problem with their dialysis access.   The patient notes a significant increase in bleeding time after decannulation, although it does not happen consistently.  The patient has also been informed that there is increased recirculation.    The patient denies hand pain or other symptoms consistent with steal phenomena.  No significant arm swelling.  The patient denies redness or swelling at the access site. The patient denies fever or chills at home or while on dialysis.  No recent shortening of the patient's walking distance or new symptoms consistent with claudication.  No history of rest pain symptoms. No new ulcers or wounds of the lower extremities have occurred.  The patient denies amaurosis fugax or recent TIA symptoms. There are no recent neurological changes noted. There is no history of DVT, PE or superficial thrombophlebitis. No recent episodes of angina or shortness of breath documented.   Duplex ultrasound of the AV access shows a patent access.  The previously noted stenosis is significantly increased compared to last study.  Flow volume today is 544 cc/min (previous flow volume was 1021 cc/min) there is a noted stenosis at the proximal upper arm as well as a significant stenosis at the confluence    Review of Systems  Hematological:  Bruises/bleeds easily.  All other systems reviewed and are negative.      Objective:   Physical Exam Vitals reviewed.  HENT:     Head: Normocephalic.  Cardiovascular:     Rate and Rhythm: Normal rate.     Pulses:          Radial pulses are 2+ on the right side and 2+ on the left side.     Arteriovenous access: Left arteriovenous access is present.    Comments: Barely perceptible  thrill with very faint bruit at the distal portion but whistling bruit at the proximal portion Pulmonary:     Effort: Pulmonary effort is normal.  Skin:    General: Skin is warm and dry.  Neurological:     Mental Status: He is alert and oriented to person, place, and time.  Psychiatric:        Mood and Affect: Mood normal.        Behavior: Behavior normal.        Thought Content: Thought content normal.        Judgment: Judgment normal.     There were no vitals taken for this visit.  Past Medical History:  Diagnosis Date   Anemia    Aortic atherosclerosis (HCC)    Arthritis    Atrial fibrillation and flutter (HCC)    a.) CHA2DS2-VASc = 5 (age x 2, CHF, HTN, aortic plaque). b.) rate/rhythm maintain on oral carvedilol; chronically anticoagulated with dose reduced apixaban   Bilateral inguinal hernia    BPH (benign prostatic hyperplasia)    Cardiac syncope    Cardiomyopathy (HCC)    a.) TTE 04/23/2016: EF 40%. b.) TTE 09/03/2016: EF 50%. c.) TTE 05/04/2019: EF >55%.   CHF (congestive heart failure) (HCC)    a.) TTE 04/23/2016: EF 40%; BAE; mild AR/TR, mod MR; G1DD. b.) TTE 09/03/2016: EF 50%; mild LVH; BAE; triv AR/PR, mod MR/TR. c.) TTE 05/04/2019: EF >55%;  mod LVH, mild LA enlargement; mild AR/MR/TR   CKD (chronic kidney disease), stage V (HCC)    Complication of anesthesia    a.) h/o intra/postoperative A.fib   Coronary artery disease    Diverticulosis    Gout    History of 2019 novel coronavirus disease (COVID-19) 11/14/2020   History of colon polyps    Hypercholesteremia    Hypertension    Long term current use of anticoagulant    a.) dose reduced apixaban; dose reduction d/t to age and CKD Dx.   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HCC)    a.) s/p resection   MGUS (monoclonal gammopathy of unknown significance)    Pleural effusion    Rectal bleeding    Secondary hyperparathyroidism of renal origin (HCC)     Social History   Socioeconomic History   Marital status: Married     Spouse name: Lawrence Perkins   Number of children: Not on file   Years of education: Not on file   Highest education level: Not on file  Occupational History   Occupation: Retired  Tobacco Use   Smoking status: Former    Current packs/day: 0.00    Types: Cigarettes    Start date: 01/01/1966    Quit date: 01/01/1981    Years since quitting: 41.7   Smokeless tobacco: Never  Vaping Use   Vaping status: Never Used  Substance and Sexual Activity   Alcohol use: Not Currently   Drug use: No   Sexual activity: Yes    Birth control/protection: None  Other Topics Concern   Not on file  Social History Narrative   Regular exercise: Yes   Social Determinants of Health   Financial Resource Strain: Not on file  Food Insecurity: No Food Insecurity (02/02/2022)   Hunger Vital Sign    Worried About Running Out of Food in the Last Year: Never true    Ran Out of Food in the Last Year: Never true  Transportation Needs: No Transportation Needs (02/02/2022)   PRAPARE - Administrator, Civil Service (Medical): No    Lack of Transportation (Non-Medical): No  Physical Activity: Not on file  Stress: Not on file  Social Connections: Unknown (10/02/2021)   Received from [facility], [facility]   Social Network    Social Network: Not on file  Intimate Partner Violence: Not At Risk (02/02/2022)   Humiliation, Afraid, Rape, and Kick questionnaire    Fear of Current or Ex-Partner: No    Emotionally Abused: No    Physically Abused: No    Sexually Abused: No    Past Surgical History:  Procedure Laterality Date   A/V FISTULAGRAM Left 06/11/2022   Procedure: A/V Fistulagram;  Surgeon: Annice Needy, MD;  Location: [facility];  Service: Cardiovascular;  Laterality: Left;   ANKLE SURGERY Right 1985   following volleyball injury   APPENDECTOMY     at the of 9 yrs   AV FISTULA PLACEMENT Left 01/04/2022   Procedure: ARTERIOVENOUS (AV) FISTULA CREATION;  Surgeon: Annice Needy, MD;  Location:  [facility] ORS;  Service: Vascular;  Laterality: Left;   CAPD INSERTION N/A 08/10/2021   Procedure: LAPAROSCOPIC INSERTION CONTINUOUS AMBULATORY PERITONEAL DIALYSIS  (CAPD) CATHETER;  Surgeon: Leafy Ro, MD;  Location: [facility] ORS;  Service: General;  Laterality: N/A;   CAPD REVISION N/A 09/21/2021   Procedure: LAPAROSCOPIC REVISION CONTINUOUS AMBULATORY PERITONEAL DIALYSIS  (CAPD) CATHETER;  Surgeon: Leafy Ro, MD;  Location: [facility] ORS;  Service: General;  Laterality: N/A;   CATARACT EXTRACTION Left    COLONOSCOPY     COLONOSCOPY WITH PROPOFOL N/A 01/02/2017   Procedure: COLONOSCOPY WITH PROPOFOL;  Surgeon: Scot Jun, MD;  Location: [facility] ENDOSCOPY;  Service: Endoscopy;  Laterality: N/A;   DIALYSIS/PERMA CATHETER INSERTION N/A 03/27/2021   Procedure: DIALYSIS/PERMA CATHETER INSERTION;  Surgeon: Annice Needy, MD;  Location: [facility];  Service: Cardiovascular;  Laterality: N/A;   DIALYSIS/PERMA CATHETER INSERTION N/A 10/10/2021   Procedure: DIALYSIS/PERMA CATHETER INSERTION;  Surgeon: Renford Dills, MD;  Location: [facility];  Service: Cardiovascular;  Laterality: N/A;   DIALYSIS/PERMA CATHETER REMOVAL N/A 05/08/2021   Procedure: DIALYSIS/PERMA CATHETER REMOVAL;  Surgeon: Annice Needy, MD;  Location: [facility];  Service: Cardiovascular;  Laterality: N/A;   DIALYSIS/PERMA CATHETER REMOVAL N/A 04/17/2022   Procedure: DIALYSIS/PERMA CATHETER REMOVAL;  Surgeon: Renford Dills, MD;  Location: [facility];  Service: Cardiovascular;  Laterality: N/A;   INSERTION OF MESH  08/10/2021   Procedure: INSERTION OF MESH;  Surgeon: Leafy Ro, MD;  Location: [facility] ORS;  Service: General;;   JOINT REPLACEMENT     RIGHT TOTAL HIP   KNEE ARTHROSCOPY WITH LATERAL RELEASE Right 04/20/2021   Procedure: Right knee medial retinaculum and lateral release with polyethylene exchange;  Surgeon: Kennedy Bucker, MD;  Location: [facility] ORS;  Service: Orthopedics;   Laterality: Right;   TONSILLECTOMY     as a child   TOTAL HIP ARTHROPLASTY  09/2009   Right femoral neck fracture   TOTAL HIP ARTHROPLASTY Left 03/06/2016   Procedure: TOTAL HIP ARTHROPLASTY ANTERIOR APPROACH;  Surgeon: Kennedy Bucker, MD;  Location: [facility] ORS;  Service: Orthopedics;  Laterality: Left;   TOTAL KNEE ARTHROPLASTY Right 03/21/2021   Procedure: TOTAL KNEE ARTHROPLASTY;  Surgeon: Kennedy Bucker, MD;  Location: [facility] ORS;  Service: Orthopedics;  Laterality: Right;   UMBILICAL HERNIA REPAIR  08/10/2021   Procedure: HERNIA REPAIR UMBILICAL ADULT;  Surgeon: Leafy Ro, MD;  Location: [facility] ORS;  Service: General;;    Family History  Problem Relation Age of Onset   Cirrhosis Mother    Other Father        Lung Fibrosis   Prostate cancer Neg Hx    Kidney cancer Neg Hx    Bladder Cancer Neg Hx     No Known Allergies     Latest Ref Rng & Units 08/20/2022    9:53 AM 02/03/2022    9:29 AM 02/02/2022   10:06 AM  CBC  WBC 4.0 - 10.5 K/uL 5.9  6.1  6.3   Hemoglobin 13.0 - 17.0 g/dL 21.3  9.0  9.3   Hematocrit 39.0 - 52.0 % 31.3  25.8  26.9   Platelets 150 - 400 K/uL 188  132  128       CMP     Component Value Date/Time   NA 131 (L) 08/20/2022 0953   NA 139 02/04/2014 1449   K 4.4 08/20/2022 0953   K 4.6 02/04/2014 1449   CL 91 (L) 08/20/2022 0953   CL 105 02/04/2014 1449   CO2 30 08/20/2022 0953   CO2 27 02/04/2014 1449   GLUCOSE 99 08/20/2022 0953   GLUCOSE 84 02/04/2014 1449   BUN 52 (H) 08/20/2022 0953   BUN 23 (H) 02/04/2014 1449   CREATININE 6.06 (H) 08/20/2022 0953   CREATININE 1.25 02/04/2014 1449   CALCIUM 9.4 08/20/2022 0953   CALCIUM 8.7 02/04/2014 1449   PROT 7.4 01/31/2022  1723   ALBUMIN 3.8 01/31/2022 1723   AST 14 (L) 01/31/2022 1723   ALT 12 01/31/2022 1723   ALKPHOS 77 01/31/2022 1723   BILITOT 1.4 (H) 01/31/2022 1723   GFRNONAA 9 (L) 08/20/2022 0953   GFRNONAA >60 02/04/2014 1449     No results found.     Assessment & Plan:   1. ESRD  (end stage renal disease) (HCC) Recommend:  The patient is experiencing increasing problems with their dialysis access.  Patient should have a fistulagram with the intention for intervention.  The intention for intervention is to restore appropriate flow and prevent thrombosis and possible loss of the access.  As well as improve the quality of dialysis therapy.  The risks, benefits and alternative therapies were reviewed in detail with the patient.  All questions were answered.  The patient agrees to proceed with angio/intervention.    The patient will follow up with me in the office after the procedure.   2. Hyperlipidemia, unspecified hyperlipidemia type Continue statin as ordered and reviewed, no changes at this time   Current Outpatient Medications on File Prior to Visit  Medication Sig Dispense Refill   apixaban (ELIQUIS) 2.5 MG TABS tablet Take 2.5 mg by mouth 2 (two) times daily.     B Complex Vitamins (B-COMPLEX/B-12 PO) Take 1,000 mcg by mouth.     calcitRIOL (ROCALTROL) 0.25 MCG capsule Take 0.25 mcg by mouth daily.     carvedilol (COREG) 3.125 MG tablet Take 6.25 mg by mouth daily.     cholecalciferol (VITAMIN D3) 25 MCG (1000 UNIT) tablet Take 1,000 Units by mouth daily.     cyanocobalamin (VITAMIN B12) 1000 MCG tablet Take 2,500 mcg by mouth daily.     Multiple Vitamin (MULTIVITAMIN WITH MINERALS) TABS tablet Take 1 tablet by mouth daily.     rosuvastatin (CRESTOR) 10 MG tablet Take 10 mg by mouth at bedtime.     Saccharomyces boulardii (PROBIOTIC) 250 MG CAPS Take 1 capsule by mouth in the morning and at bedtime. 12 capsule 0   sevelamer carbonate (RENVELA) 800 MG tablet Take 1,600 mg by mouth 3 (three) times daily.     torsemide (DEMADEX) 20 MG tablet Take 2 tablets (40 mg total) by mouth daily. 60 tablet 0   traZODone (DESYREL) 50 MG tablet Take 50 mg by mouth at bedtime as needed for sleep.     No current facility-administered medications on file prior to visit.     There are no Patient Instructions on file for this visit. No follow-ups on file.   Georgiana Spinner, NP

## 2022-09-28 NOTE — Telephone Encounter (Signed)
Spoke with the patient's spouse and he is scheduled with Dr. Wyn Quaker on 10/02/22 with a 12:30 pm arrival time to the [facility] for a left arm fistulagram. Pre-procedure instructions were discussed and will be sent to Mychart.

## 2022-09-28 NOTE — Progress Notes (Signed)
Subjective:    Patient ID: Lawrence Acres., male    DOB: 02/01/1940, 82 y.o.   MRN: [id number] Chief Complaint  Patient presents with   Follow-up    Ref [facility] consult bleeding/ fistula    The patient returns to the office for follow up regarding a problem with their dialysis access.   The patient notes a significant increase in bleeding time after decannulation, although it does not happen consistently.  The patient has also been informed that there is increased recirculation.    The patient denies hand pain or other symptoms consistent with steal phenomena.  No significant arm swelling.  The patient denies redness or swelling at the access site. The patient denies fever or chills at home or while on dialysis.  No recent shortening of the patient's walking distance or new symptoms consistent with claudication.  No history of rest pain symptoms. No new ulcers or wounds of the lower extremities have occurred.  The patient denies amaurosis fugax or recent TIA symptoms. There are no recent neurological changes noted. There is no history of DVT, PE or superficial thrombophlebitis. No recent episodes of angina or shortness of breath documented.   Duplex ultrasound of the AV access shows a patent access.  The previously noted stenosis is significantly increased compared to last study.  Flow volume today is 544 cc/min (previous flow volume was 1021 cc/min) there is a noted stenosis at the proximal upper arm as well as a significant stenosis at the confluence    Review of Systems  Hematological:  Bruises/bleeds easily.  All other systems reviewed and are negative.      Objective:   Physical Exam Vitals reviewed.  HENT:     Head: Normocephalic.  Cardiovascular:     Rate and Rhythm: Normal rate.     Pulses:          Radial pulses are 2+ on the right side and 2+ on the left side.     Arteriovenous access: Left arteriovenous access is present.    Comments: Barely perceptible  thrill with very faint bruit at the distal portion but whistling bruit at the proximal portion Pulmonary:     Effort: Pulmonary effort is normal.  Skin:    General: Skin is warm and dry.  Neurological:     Mental Status: He is alert and oriented to person, place, and time.  Psychiatric:        Mood and Affect: Mood normal.        Behavior: Behavior normal.        Thought Content: Thought content normal.        Judgment: Judgment normal.     There were no vitals taken for this visit.  Past Medical History:  Diagnosis Date   Anemia    Aortic atherosclerosis (HCC)    Arthritis    Atrial fibrillation and flutter (HCC)    a.) CHA2DS2-VASc = 5 (age x 2, CHF, HTN, aortic plaque). b.) rate/rhythm maintain on oral carvedilol; chronically anticoagulated with dose reduced apixaban   Bilateral inguinal hernia    BPH (benign prostatic hyperplasia)    Cardiac syncope    Cardiomyopathy (HCC)    a.) TTE 04/23/2016: EF 40%. b.) TTE 09/03/2016: EF 50%. c.) TTE 05/04/2019: EF >55%.   CHF (congestive heart failure) (HCC)    a.) TTE 04/23/2016: EF 40%; BAE; mild AR/TR, mod MR; G1DD. b.) TTE 09/03/2016: EF 50%; mild LVH; BAE; triv AR/PR, mod MR/TR. c.) TTE 05/04/2019: EF >55%;  mod LVH, mild LA enlargement; mild AR/MR/TR   CKD (chronic kidney disease), stage V (HCC)    Complication of anesthesia    a.) h/o intra/postoperative A.fib   Coronary artery disease    Diverticulosis    Gout    History of 2019 novel coronavirus disease (COVID-19) 11/14/2020   History of colon polyps    Hypercholesteremia    Hypertension    Long term current use of anticoagulant    a.) dose reduced apixaban; dose reduction d/t to age and CKD Dx.   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HCC)    a.) s/p resection   MGUS (monoclonal gammopathy of unknown significance)    Pleural effusion    Rectal bleeding    Secondary hyperparathyroidism of renal origin (HCC)     Social History   Socioeconomic History   Marital status: Married     Spouse name: Eber Jones   Number of children: Not on file   Years of education: Not on file   Highest education level: Not on file  Occupational History   Occupation: Retired  Tobacco Use   Smoking status: Former    Current packs/day: 0.00    Types: Cigarettes    Start date: 01/01/1966    Quit date: 01/01/1981    Years since quitting: 41.7   Smokeless tobacco: Never  Vaping Use   Vaping status: Never Used  Substance and Sexual Activity   Alcohol use: Not Currently   Drug use: No   Sexual activity: Yes    Birth control/protection: None  Other Topics Concern   Not on file  Social History Narrative   Regular exercise: Yes   Social Determinants of Health   Financial Resource Strain: Not on file  Food Insecurity: No Food Insecurity (02/02/2022)   Hunger Vital Sign    Worried About Running Out of Food in the Last Year: Never true    Ran Out of Food in the Last Year: Never true  Transportation Needs: No Transportation Needs (02/02/2022)   PRAPARE - Administrator, Civil Service (Medical): No    Lack of Transportation (Non-Medical): No  Physical Activity: Not on file  Stress: Not on file  Social Connections: Unknown (10/02/2021)   Received from [facility], [facility]   Social Network    Social Network: Not on file  Intimate Partner Violence: Not At Risk (02/02/2022)   Humiliation, Afraid, Rape, and Kick questionnaire    Fear of Current or Ex-Partner: No    Emotionally Abused: No    Physically Abused: No    Sexually Abused: No    Past Surgical History:  Procedure Laterality Date   A/V FISTULAGRAM Left 06/11/2022   Procedure: A/V Fistulagram;  Surgeon: Annice Needy, MD;  Location: [facility];  Service: Cardiovascular;  Laterality: Left;   ANKLE SURGERY Right 1985   following volleyball injury   APPENDECTOMY     at the of 9 yrs   AV FISTULA PLACEMENT Left 01/04/2022   Procedure: ARTERIOVENOUS (AV) FISTULA CREATION;  Surgeon: Annice Needy, MD;  Location:  [facility] ORS;  Service: Vascular;  Laterality: Left;   CAPD INSERTION N/A 08/10/2021   Procedure: LAPAROSCOPIC INSERTION CONTINUOUS AMBULATORY PERITONEAL DIALYSIS  (CAPD) CATHETER;  Surgeon: Leafy Ro, MD;  Location: [facility] ORS;  Service: General;  Laterality: N/A;   CAPD REVISION N/A 09/21/2021   Procedure: LAPAROSCOPIC REVISION CONTINUOUS AMBULATORY PERITONEAL DIALYSIS  (CAPD) CATHETER;  Surgeon: Leafy Ro, MD;  Location: [facility] OR[facility];  Service: General;  Laterality: N/A;   CATARACT EXTRACTION Left    COLONOSCOPY     COLONOSCOPY WITH PROPOFOL N/A 01/02/2017   Procedure: COLONOSCOPY WITH PROPOFOL;  Surgeon: Scot Jun, MD;  Location: [facility] ENDOSCOPY;  Service: Endoscopy;  Laterality: N/A;   DIALYSIS/PERMA CATHETER INSERTION N/A 03/27/2021   Procedure: DIALYSIS/PERMA CATHETER INSERTION;  Surgeon: Annice Needy, MD;  Location: [facility];  Service: Cardiovascular;  Laterality: N/A;   DIALYSIS/PERMA CATHETER INSERTION N/A 10/10/2021   Procedure: DIALYSIS/PERMA CATHETER INSERTION;  Surgeon: Renford Dills, MD;  Location: [facility];  Service: Cardiovascular;  Laterality: N/A;   DIALYSIS/PERMA CATHETER REMOVAL N/A 05/08/2021   Procedure: DIALYSIS/PERMA CATHETER REMOVAL;  Surgeon: Annice Needy, MD;  Location: [facility];  Service: Cardiovascular;  Laterality: N/A;   DIALYSIS/PERMA CATHETER REMOVAL N/A 04/17/2022   Procedure: DIALYSIS/PERMA CATHETER REMOVAL;  Surgeon: Renford Dills, MD;  Location: [facility];  Service: Cardiovascular;  Laterality: N/A;   INSERTION OF MESH  08/10/2021   Procedure: INSERTION OF MESH;  Surgeon: Leafy Ro, MD;  Location: [facility] OR[facility];  Service: General;;   JOINT REPLACEMENT     RIGHT TOTAL HIP   KNEE ARTHROSCOPY WITH LATERAL RELEASE Right 04/20/2021   Procedure: Right knee medial retinaculum and lateral release with polyethylene exchange;  Surgeon: Kennedy Bucker, MD;  Location: [facility];  Service: Orthopedics;   Laterality: Right;   TONSILLECTOMY     as a child   TOTAL HIP ARTHROPLASTY  09/2009   Right femoral neck fracture   TOTAL HIP ARTHROPLASTY Left 03/06/2016   Procedure: TOTAL HIP ARTHROPLASTY ANTERIOR APPROACH;  Surgeon: Kennedy Bucker, MD;  Location: [facility];  Service: Orthopedics;  Laterality: Left;   TOTAL KNEE ARTHROPLASTY Right 03/21/2021   Procedure: TOTAL KNEE ARTHROPLASTY;  Surgeon: Kennedy Bucker, MD;  Location: [facility];  Service: Orthopedics;  Laterality: Right;   UMBILICAL HERNIA REPAIR  08/10/2021   Procedure: HERNIA REPAIR UMBILICAL ADULT;  Surgeon: Leafy Ro, MD;  Location: [facility] OR[facility];  Service: General;;    Family History  Problem Relation Age of Onset   Cirrhosis Mother    Other Father        Lung Fibrosis   Prostate cancer Neg Hx    Kidney cancer Neg Hx    Bladder Cancer Neg Hx     No Known Allergies     Latest Ref Rng & Units 08/20/2022    9:53 AM 02/03/2022    9:29 AM 02/02/2022   10:06 AM  CBC  WBC 4.0 - 10.5 K/uL 5.9  6.1  6.3   Hemoglobin 13.0 - 17.0 g/dL 21.3  9.0  9.3   Hematocrit 39.0 - 52.0 % 31.3  25.8  26.9   Platelets 150 - 400 K/uL 188  132  128       CMP     Component Value Date/Time   NA 131 (L) 08/20/2022 0953   NA 139 02/04/2014 1449   K 4.4 08/20/2022 0953   K 4.6 02/04/2014 1449   CL 91 (L) 08/20/2022 0953   CL 105 02/04/2014 1449   CO2 30 08/20/2022 0953   CO2 27 02/04/2014 1449   GLUCOSE 99 08/20/2022 0953   GLUCOSE 84 02/04/2014 1449   BUN 52 (H) 08/20/2022 0953   BUN 23 (H) 02/04/2014 1449   CREATININE 6.06 (H) 08/20/2022 0953   CREATININE 1.25 02/04/2014 1449   CALCIUM 9.4 08/20/2022 0953   CALCIUM 8.7 02/04/2014 1449   PROT 7.4 01/31/2022  1723   ALBUMIN 3.8 01/31/2022 1723   AST 14 (L) 01/31/2022 1723   ALT 12 01/31/2022 1723   ALKPHOS 77 01/31/2022 1723   BILITOT 1.4 (H) 01/31/2022 1723   GFRNONAA 9 (L) 08/20/2022 0953   GFRNONAA >60 02/04/2014 1449     No results found.     Assessment & Plan:   1. ESRD  (end stage renal disease) (HCC) Recommend:  The patient is experiencing increasing problems with their dialysis access.  Patient should have a fistulagram with the intention for intervention.  The intention for intervention is to restore appropriate flow and prevent thrombosis and possible loss of the access.  As well as improve the quality of dialysis therapy.  The risks, benefits and alternative therapies were reviewed in detail with the patient.  All questions were answered.  The patient agrees to proceed with angio/intervention.    The patient will follow up with me in the office after the procedure.   2. Hyperlipidemia, unspecified hyperlipidemia type Continue statin as ordered and reviewed, no changes at this time   Current Outpatient Medications on File Prior to Visit  Medication Sig Dispense Refill   apixaban (ELIQUIS) 2.5 MG TABS tablet Take 2.5 mg by mouth 2 (two) times daily.     B Complex Vitamins (B-COMPLEX/B-12 PO) Take 1,000 mcg by mouth.     calcitRIOL (ROCALTROL) 0.25 MCG capsule Take 0.25 mcg by mouth daily.     carvedilol (COREG) 3.125 MG tablet Take 6.25 mg by mouth daily.     cholecalciferol (VITAMIN D3) 25 MCG (1000 UNIT) tablet Take 1,000 Units by mouth daily.     cyanocobalamin (VITAMIN B12) 1000 MCG tablet Take 2,500 mcg by mouth daily.     Multiple Vitamin (MULTIVITAMIN WITH MINERALS) TABS tablet Take 1 tablet by mouth daily.     rosuvastatin (CRESTOR) 10 MG tablet Take 10 mg by mouth at bedtime.     Saccharomyces boulardii (PROBIOTIC) 250 MG CAPS Take 1 capsule by mouth in the morning and at bedtime. 12 capsule 0   sevelamer carbonate (RENVELA) 800 MG tablet Take 1,600 mg by mouth 3 (three) times daily.     torsemide (DEMADEX) 20 MG tablet Take 2 tablets (40 mg total) by mouth daily. 60 tablet 0   traZODone (DESYREL) 50 MG tablet Take 50 mg by mouth at bedtime as needed for sleep.     No current facility-administered medications on file prior to visit.     There are no Patient Instructions on file for this visit. No follow-ups on file.   Georgiana Spinner, NP

## 2022-09-29 DIAGNOSIS — Z992 Dependence on renal dialysis: Secondary | ICD-10-CM | POA: Diagnosis not present

## 2022-09-29 DIAGNOSIS — N186 End stage renal disease: Secondary | ICD-10-CM | POA: Diagnosis not present

## 2022-10-01 DIAGNOSIS — N186 End stage renal disease: Secondary | ICD-10-CM | POA: Diagnosis not present

## 2022-10-01 DIAGNOSIS — Z992 Dependence on renal dialysis: Secondary | ICD-10-CM | POA: Diagnosis not present

## 2022-10-02 ENCOUNTER — Ambulatory Visit
Admission: RE | Admit: 2022-10-02 | Discharge: 2022-10-02 | Disposition: A | Discharge status: Home / Self Care | Payer: Medicare HMO | Attending: Vascular Surgery | Admitting: Vascular Surgery

## 2022-10-02 ENCOUNTER — Encounter
Admission: RE | Disposition: A | Discharge status: Home / Self Care | Payer: Self-pay | Source: Home / Self Care | Attending: Vascular Surgery

## 2022-10-02 ENCOUNTER — Encounter: Payer: Self-pay | Admitting: Vascular Surgery

## 2022-10-02 DIAGNOSIS — Z87891 Personal history of nicotine dependence: Secondary | ICD-10-CM | POA: Insufficient documentation

## 2022-10-02 DIAGNOSIS — I132 Hypertensive heart and chronic kidney disease with heart failure and with stage 5 chronic kidney disease, or end stage renal disease: Secondary | ICD-10-CM | POA: Diagnosis not present

## 2022-10-02 DIAGNOSIS — E785 Hyperlipidemia, unspecified: Secondary | ICD-10-CM | POA: Insufficient documentation

## 2022-10-02 DIAGNOSIS — Z992 Dependence on renal dialysis: Secondary | ICD-10-CM | POA: Insufficient documentation

## 2022-10-02 DIAGNOSIS — N186 End stage renal disease: Secondary | ICD-10-CM | POA: Diagnosis not present

## 2022-10-02 DIAGNOSIS — I509 Heart failure, unspecified: Secondary | ICD-10-CM | POA: Diagnosis not present

## 2022-10-02 DIAGNOSIS — T82858A Stenosis of vascular prosthetic devices, implants and grafts, initial encounter: Secondary | ICD-10-CM | POA: Insufficient documentation

## 2022-10-02 DIAGNOSIS — Y841 Kidney dialysis as the cause of abnormal reaction of the patient, or of later complication, without mention of misadventure at the time of the procedure: Secondary | ICD-10-CM | POA: Diagnosis not present

## 2022-10-02 HISTORY — PX: A/V FISTULAGRAM: CATH118298

## 2022-10-02 LAB — POTASSIUM (ARMC VASCULAR LAB ONLY): Potassium (ARMC vascular lab): 3.9 mmol/L (ref 3.5–5.1)

## 2022-10-02 SURGERY — A/V FISTULAGRAM
Anesthesia: Moderate Sedation | Laterality: Left

## 2022-10-02 MED ORDER — DIPHENHYDRAMINE HCL 50 MG/ML IJ SOLN: 50.0000 mg | Freq: Once | INTRAMUSCULAR | Status: DC | PRN

## 2022-10-02 MED ORDER — HEPARIN SODIUM (PORCINE) 1000 UNIT/ML IJ SOLN
INTRAMUSCULAR | Status: DC | PRN
  Administered 2022-10-02: 3000 [IU] via INTRAVENOUS

## 2022-10-02 MED ORDER — HEPARIN (PORCINE) IN NACL 1000-0.9 UT/500ML-% IV SOLN
INTRAVENOUS | Status: DC | PRN
  Administered 2022-10-02: 500 mL

## 2022-10-02 MED ORDER — IODIXANOL 320 MG/ML IV SOLN
INTRAVENOUS | Status: DC | PRN
  Administered 2022-10-02: 25 mL

## 2022-10-02 MED ORDER — MIDAZOLAM HCL 2 MG/2ML IJ SOLN
INTRAMUSCULAR | Status: DC | PRN
  Administered 2022-10-02: 2 mg via INTRAVENOUS

## 2022-10-02 MED ORDER — SODIUM CHLORIDE 0.9 % IV SOLN: INTRAVENOUS | Status: DC

## 2022-10-02 MED ORDER — MIDAZOLAM HCL 5 MG/5ML IJ SOLN
INTRAMUSCULAR | Status: AC
  Filled 2022-10-02: qty 5

## 2022-10-02 MED ORDER — HEPARIN SODIUM (PORCINE) 1000 UNIT/ML IJ SOLN
INTRAMUSCULAR | Status: AC
  Filled 2022-10-02: qty 10

## 2022-10-02 MED ORDER — HYDROMORPHONE HCL 1 MG/ML IJ SOLN: 1.0000 mg | Freq: Once | INTRAMUSCULAR | Status: DC | PRN

## 2022-10-02 MED ORDER — FENTANYL CITRATE (PF) 100 MCG/2ML IJ SOLN
INTRAMUSCULAR | Status: AC
  Filled 2022-10-02: qty 2

## 2022-10-02 MED ORDER — LIDOCAINE-EPINEPHRINE (PF) 1 %-1:200000 IJ SOLN
INTRAMUSCULAR | Status: DC | PRN
  Administered 2022-10-02: 10 mL

## 2022-10-02 MED ORDER — ONDANSETRON HCL 4 MG/2ML IJ SOLN: 4.0000 mg | Freq: Four times a day (QID) | INTRAMUSCULAR | Status: DC | PRN

## 2022-10-02 MED ORDER — METHYLPREDNISOLONE SODIUM SUCC 125 MG IJ SOLR: 125.0000 mg | Freq: Once | INTRAMUSCULAR | Status: DC | PRN

## 2022-10-02 MED ORDER — CEFAZOLIN SODIUM-DEXTROSE 1-4 GM/50ML-% IV SOLN
INTRAVENOUS | Status: AC
  Filled 2022-10-02: qty 50

## 2022-10-02 MED ORDER — FAMOTIDINE 20 MG PO TABS: 40.0000 mg | ORAL_TABLET | Freq: Once | ORAL | Status: DC | PRN

## 2022-10-02 MED ORDER — FENTANYL CITRATE (PF) 100 MCG/2ML IJ SOLN
INTRAMUSCULAR | Status: DC | PRN
  Administered 2022-10-02: 50 ug via INTRAVENOUS

## 2022-10-02 MED ORDER — MIDAZOLAM HCL 2 MG/ML PO SYRP: 8.0000 mg | ORAL_SOLUTION | Freq: Once | ORAL | Status: DC | PRN

## 2022-10-02 MED ORDER — CEFAZOLIN SODIUM-DEXTROSE 1-4 GM/50ML-% IV SOLN
1.0000 g | INTRAVENOUS | Status: AC
  Administered 2022-10-02: 1 g via INTRAVENOUS

## 2022-10-02 SURGICAL SUPPLY — 15 items
BALLN LUTONIX 7X80X130 (BALLOONS) ×1
BALLN LUTONIX DCB 6X80X130 (BALLOONS) ×1
BALLOON LUTONIX 7X80X130 (BALLOONS) IMPLANT
BALLOON LUTONIX DCB 6X80X130 (BALLOONS) IMPLANT
CANNULA 5F STIFF (CANNULA) IMPLANT
COVER PROBE ULTRASOUND 5X96 (MISCELLANEOUS) IMPLANT
DRAPE BRACHIAL (DRAPES) IMPLANT
GLIDEWIRE ADV .035X180CM (WIRE) IMPLANT
KIT ENCORE 26 ADVANTAGE (KITS) IMPLANT
PACK ANGIOGRAPHY (CUSTOM PROCEDURE TRAY) ×1 IMPLANT
SHEATH BRITE TIP 6FRX5.5 (SHEATH) IMPLANT
SHEATH BRITE TIP 7FRX5.5 (SHEATH) IMPLANT
STENT VIABAHN 8X7.5X120 (Permanent Stent) IMPLANT
SUT MNCRL AB 4-0 PS2 18 (SUTURE) IMPLANT
WIRE G 018X200 V18 (WIRE) IMPLANT

## 2022-10-02 NOTE — Interval H&P Note (Signed)
History and Physical Interval Note:  10/02/2022 12:39 PM  Lawrence Perkins.  has presented today for surgery, with the diagnosis of L arm fistulagram    End Stage Renal.  The various methods of treatment have been discussed with the patient and family. After consideration of risks, benefits and other options for treatment, the patient has consented to  Procedure(s): A/V Fistulagram (Left) as a surgical intervention.  The patient's history has been reviewed, patient examined, no change in status, stable for surgery.  I have reviewed the patient's chart and labs.  Questions were answered to the patient's satisfaction.     [name]

## 2022-10-02 NOTE — Op Note (Signed)
[facility]    OPERATIVE NOTE   PROCEDURE: 1.   Left brachiocephalic arteriovenous fistula cannulation under ultrasound guidance 2.   Left arm fistulagram including central venogram 3.   Percutaneous transluminal angioplasty of proximal left upper arm cephalic vein stenosis with 6 mm diameter by 8 cm length Lutonix drug-coated angioplasty balloon 4.   Stent placement to proximal upper arm cephalic vein stenosis for greater than 50% residual stenosis after angioplasty using an 8 mm diameter by 7.5 cm length Viabahn stent  PRE-OPERATIVE DIAGNOSIS: 1. ESRD 2. Poorly functional left brachiocephalic AVF  POST-OPERATIVE DIAGNOSIS: same as above   SURGEON: [name], MD  ANESTHESIA: local with MCS  ESTIMATED BLOOD LOSS: 5 cc  FINDING(S): 90 to 95% stenosis of the proximal upper arm cephalic vein just proximal to the previously placed stents.  The remainder of the left brachiocephalic AV fistula including the cephalic vein subclavian vein confluence and central venous circulation was patent without focal stenosis requiring intervention.  SPECIMEN(S):  None  CONTRAST: 25 cc  FLUORO TIME: 1.6 minutes  MODERATE CONSCIOUS SEDATION TIME: Approximately 25 minutes with 2 mg of Versed and 50 mcg of Fentanyl   INDICATIONS: Lawrence Perkins. is a 82 y.o. male who presents with malfunctioning left brachiocephalic arteriovenous fistula.  The patient is scheduled for left arm fistulagram.  The patient is aware the risks include but are not limited to: bleeding, infection, thrombosis of the cannulated access, and possible anaphylactic reaction to the contrast.  The patient is aware of the risks of the procedure and elects to proceed forward.  DESCRIPTION: After full informed written consent was obtained, the patient was brought back to the angiography suite and placed supine upon the angiography table.  The patient was connected to monitoring equipment. Moderate conscious  sedation was administered with a face to face encounter with the patient throughout the procedure with my supervision of the RN administering medicines and monitoring the patient's vital signs and mental status throughout from the start of the procedure until the patient was taken to the recovery room. The left arm was prepped and draped in the standard fashion for a percutaneous access intervention.  Under ultrasound guidance, the left brachiocephalic arteriovenous fistula was cannulated with a micropuncture needle under direct ultrasound guidance where it was patent and a permanent image was performed.  The microwire was advanced into the fistula and the needle was exchanged for the a microsheath.  I then upsized to a 6 Fr Sheath and imaging was performed.  Hand injections were completed to image the access including the central venous system. This demonstrated 90 to 95% stenosis of the proximal upper arm cephalic vein just proximal to the previously placed stents.  The remainder of the left brachiocephalic AV fistula including the cephalic vein subclavian vein confluence and central venous circulation was patent without focal stenosis requiring intervention.  Based on the images, this patient will need intervention to this proximal upper arm cephalic vein stenosis. I then gave the patient 3000 units of intravenous heparin.  I then crossed the stenosis with a Supracore wire.  Based on the imaging, a 6 mm x 8 cm  Lutonix drug coated angioplasty balloon was selected.  The balloon was centered around the proximal upper arm cephalic vein stenosis and inflated to 12 ATM for 1 minute(s).  On completion imaging, a 70-80% residual stenosis was present.  I elected to stent the lesion.  I upsized to a 7 [place] sheath and a 0.018  wire.  An 8 mm diameter by 7.5 cm length Viabahn stent was then selected and deployed across the lesion.  This was postdilated with a 7 mm diameter Lutonix drug-coated angioplasty balloon with  excellent angiographic completion result and less than 10% residual stenosis   Based on the completion imaging, no further intervention is necessary.  The wire and balloon were removed from the sheath.  A 4-0 Monocryl purse-string suture was sewn around the sheath.  The sheath was removed while tying down the suture.  A sterile bandage was applied to the puncture site.  COMPLICATIONS: None  CONDITION: Stable   [name]  10/02/2022 2:02 PM   This note was created with Dragon Medical transcription system. Any errors in dictation are purely unintentional.

## 2022-10-03 ENCOUNTER — Encounter: Payer: Self-pay | Admitting: Vascular Surgery

## 2022-10-08 DIAGNOSIS — Z87891 Personal history of nicotine dependence: Secondary | ICD-10-CM | POA: Diagnosis not present

## 2022-10-08 DIAGNOSIS — I5022 Chronic systolic (congestive) heart failure: Secondary | ICD-10-CM | POA: Diagnosis not present

## 2022-10-08 DIAGNOSIS — I700 Atherosclerosis of aorta: Secondary | ICD-10-CM | POA: Diagnosis not present

## 2022-10-08 DIAGNOSIS — D3131 Benign neoplasm of right choroid: Secondary | ICD-10-CM | POA: Diagnosis not present

## 2022-10-08 DIAGNOSIS — H353131 Nonexudative age-related macular degeneration, bilateral, early dry stage: Secondary | ICD-10-CM | POA: Diagnosis not present

## 2022-10-08 DIAGNOSIS — Z992 Dependence on renal dialysis: Secondary | ICD-10-CM | POA: Diagnosis not present

## 2022-10-08 DIAGNOSIS — I4891 Unspecified atrial fibrillation: Secondary | ICD-10-CM | POA: Diagnosis not present

## 2022-10-08 DIAGNOSIS — N2581 Secondary hyperparathyroidism of renal origin: Secondary | ICD-10-CM | POA: Diagnosis not present

## 2022-10-08 DIAGNOSIS — I132 Hypertensive heart and chronic kidney disease with heart failure and with stage 5 chronic kidney disease, or end stage renal disease: Secondary | ICD-10-CM | POA: Diagnosis not present

## 2022-10-08 DIAGNOSIS — R7303 Prediabetes: Secondary | ICD-10-CM | POA: Diagnosis not present

## 2022-10-08 DIAGNOSIS — Z0000 Encounter for general adult medical examination without abnormal findings: Secondary | ICD-10-CM | POA: Diagnosis not present

## 2022-10-08 DIAGNOSIS — N186 End stage renal disease: Secondary | ICD-10-CM | POA: Diagnosis not present

## 2022-10-08 DIAGNOSIS — Z1331 Encounter for screening for depression: Secondary | ICD-10-CM | POA: Diagnosis not present

## 2022-10-08 DIAGNOSIS — I2510 Atherosclerotic heart disease of native coronary artery without angina pectoris: Secondary | ICD-10-CM | POA: Diagnosis not present

## 2022-10-11 DIAGNOSIS — Z992 Dependence on renal dialysis: Secondary | ICD-10-CM | POA: Diagnosis not present

## 2022-10-11 DIAGNOSIS — N186 End stage renal disease: Secondary | ICD-10-CM | POA: Diagnosis not present

## 2022-10-18 DIAGNOSIS — Z992 Dependence on renal dialysis: Secondary | ICD-10-CM | POA: Diagnosis not present

## 2022-10-18 DIAGNOSIS — N186 End stage renal disease: Secondary | ICD-10-CM | POA: Diagnosis not present

## 2022-11-01 DIAGNOSIS — N186 End stage renal disease: Secondary | ICD-10-CM | POA: Diagnosis not present

## 2022-11-01 DIAGNOSIS — Z992 Dependence on renal dialysis: Secondary | ICD-10-CM | POA: Diagnosis not present

## 2022-11-03 DIAGNOSIS — N186 End stage renal disease: Secondary | ICD-10-CM | POA: Diagnosis not present

## 2022-11-03 DIAGNOSIS — Z992 Dependence on renal dialysis: Secondary | ICD-10-CM | POA: Diagnosis not present

## 2022-11-06 DIAGNOSIS — Z992 Dependence on renal dialysis: Secondary | ICD-10-CM | POA: Diagnosis not present

## 2022-11-06 DIAGNOSIS — N186 End stage renal disease: Secondary | ICD-10-CM | POA: Diagnosis not present

## 2022-11-08 DIAGNOSIS — Z992 Dependence on renal dialysis: Secondary | ICD-10-CM | POA: Diagnosis not present

## 2022-11-08 DIAGNOSIS — N186 End stage renal disease: Secondary | ICD-10-CM | POA: Diagnosis not present

## 2022-11-10 DIAGNOSIS — Z992 Dependence on renal dialysis: Secondary | ICD-10-CM | POA: Diagnosis not present

## 2022-11-10 DIAGNOSIS — N186 End stage renal disease: Secondary | ICD-10-CM | POA: Diagnosis not present

## 2022-11-13 DIAGNOSIS — N186 End stage renal disease: Secondary | ICD-10-CM | POA: Diagnosis not present

## 2022-11-13 DIAGNOSIS — Z992 Dependence on renal dialysis: Secondary | ICD-10-CM | POA: Diagnosis not present

## 2022-11-14 ENCOUNTER — Encounter (INDEPENDENT_AMBULATORY_CARE_PROVIDER_SITE_OTHER): Payer: Medicare HMO

## 2022-11-14 ENCOUNTER — Ambulatory Visit (INDEPENDENT_AMBULATORY_CARE_PROVIDER_SITE_OTHER): Payer: Medicare HMO | Admitting: Nurse Practitioner

## 2022-11-14 ENCOUNTER — Other Ambulatory Visit (INDEPENDENT_AMBULATORY_CARE_PROVIDER_SITE_OTHER): Payer: Self-pay | Admitting: Vascular Surgery

## 2022-11-14 DIAGNOSIS — N186 End stage renal disease: Secondary | ICD-10-CM

## 2022-11-15 DIAGNOSIS — Z992 Dependence on renal dialysis: Secondary | ICD-10-CM | POA: Diagnosis not present

## 2022-11-15 DIAGNOSIS — N186 End stage renal disease: Secondary | ICD-10-CM | POA: Diagnosis not present

## 2022-11-17 DIAGNOSIS — N186 End stage renal disease: Secondary | ICD-10-CM | POA: Diagnosis not present

## 2022-11-17 DIAGNOSIS — Z992 Dependence on renal dialysis: Secondary | ICD-10-CM | POA: Diagnosis not present

## 2022-11-19 ENCOUNTER — Ambulatory Visit (INDEPENDENT_AMBULATORY_CARE_PROVIDER_SITE_OTHER): Payer: Medicare HMO | Admitting: Nurse Practitioner

## 2022-11-19 ENCOUNTER — Encounter (INDEPENDENT_AMBULATORY_CARE_PROVIDER_SITE_OTHER): Payer: Self-pay | Admitting: Nurse Practitioner

## 2022-11-19 ENCOUNTER — Ambulatory Visit (INDEPENDENT_AMBULATORY_CARE_PROVIDER_SITE_OTHER): Payer: Medicare HMO

## 2022-11-19 VITALS — BP 134/76 | HR 47 | Resp 18 | Ht 70.0 in | Wt 183.2 lb

## 2022-11-19 DIAGNOSIS — N183 Chronic kidney disease, stage 3 unspecified: Secondary | ICD-10-CM | POA: Diagnosis not present

## 2022-11-19 DIAGNOSIS — E785 Hyperlipidemia, unspecified: Secondary | ICD-10-CM

## 2022-11-19 DIAGNOSIS — I129 Hypertensive chronic kidney disease with stage 1 through stage 4 chronic kidney disease, or unspecified chronic kidney disease: Secondary | ICD-10-CM

## 2022-11-19 DIAGNOSIS — N186 End stage renal disease: Secondary | ICD-10-CM | POA: Diagnosis not present

## 2022-11-19 NOTE — Progress Notes (Signed)
Subjective:    Patient ID: Lawrence Perkins., male    DOB: 1940/04/02, 82 y.o.   MRN: [id number] Chief Complaint  Patient presents with   Follow-up    6 week follow uo with HDA     The patient returns to the office for followup status post intervention of their dialysis access left brachiocephalic AV fistula on 10/02/2022.   Following the intervention the access function has significantly improved, with better flow rates and improved KT/V. The patient has not been experiencing increased bleeding times following decannulation and the patient denies increased recirculation. The patient denies an increase in arm swelling. At the present time the patient denies hand pain.  No recent shortening of the patient's walking distance or new symptoms consistent with claudication.  No history of rest pain symptoms. No new ulcers or wounds of the lower extremities have occurred.  The patient denies amaurosis fugax or recent TIA symptoms. There are no recent neurological changes noted. There is no history of DVT, PE or superficial thrombophlebitis. No recent episodes of angina or shortness of breath documented.   Duplex ultrasound of the AV access shows a patent access.  The previously noted stenosis is improved compared to last study.  Flow volume today is 1096 cc/min (previous flow volume was 544 cc/min)       Review of Systems  Hematological:  Does not bruise/bleed easily.  All other systems reviewed and are negative.      Objective:   Physical Exam Vitals reviewed.  HENT:     Head: Normocephalic.  Cardiovascular:     Rate and Rhythm: Normal rate.  Pulmonary:     Effort: Pulmonary effort is normal.  Skin:    General: Skin is warm and dry.  Neurological:     Mental Status: He is alert and oriented to person, place, and time.  Psychiatric:        Mood and Affect: Mood normal.        Behavior: Behavior normal.        Thought Content: Thought content normal.        Judgment:  Judgment normal.     BP 134/76 (BP Location: Right Arm)   Pulse (!) 47   Resp 18   Ht 5\' 10"  (1.778 m)   Wt 183 lb 3.2 oz (83.1 kg)   BMI 26.29 kg/m   Past Medical History:  Diagnosis Date   Anemia    Aortic atherosclerosis (HCC)    Arthritis    Atrial fibrillation and flutter (HCC)    a.) CHA2DS2-VASc = 5 (age x 2, CHF, HTN, aortic plaque). b.) rate/rhythm maintain on oral carvedilol; chronically anticoagulated with dose reduced apixaban   Bilateral inguinal hernia    BPH (benign prostatic hyperplasia)    Cardiac syncope    Cardiomyopathy (HCC)    a.) TTE 04/23/2016: EF 40%. b.) TTE 09/03/2016: EF 50%. c.) TTE 05/04/2019: EF >55%.   CHF (congestive heart failure) (HCC)    a.) TTE 04/23/2016: EF 40%; BAE; mild AR/TR, mod MR; G1DD. b.) TTE 09/03/2016: EF 50%; mild LVH; BAE; triv AR/PR, mod MR/TR. c.) TTE 05/04/2019: EF >55%; mod LVH, mild LA enlargement; mild AR/MR/TR   CKD (chronic kidney disease), stage V (HCC)    Complication of anesthesia    a.) h/o intra/postoperative A.fib   Coronary artery disease    Diverticulosis    Gout    History of 2019 novel coronavirus disease (COVID-19) 11/14/2020   History of colon polyps  Hypercholesteremia    Hypertension    Long term current use of anticoagulant    a.) dose reduced apixaban; dose reduction d/t to age and CKD Dx.   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HCC)    a.) s/p resection   MGUS (monoclonal gammopathy of unknown significance)    Pleural effusion    Rectal bleeding    Secondary hyperparathyroidism of renal origin (HCC)     Social History   Socioeconomic History   Marital status: Married    Spouse name: Eber Jones   Number of children: Not on file   Years of education: Not on file   Highest education level: Not on file  Occupational History   Occupation: Retired  Tobacco Use   Smoking status: Former    Current packs/day: 0.00    Types: Cigarettes    Start date: 01/01/1966    Quit date: 01/01/1981    Years since quitting: 41.9    Smokeless tobacco: Never  Vaping Use   Vaping status: Never Used  Substance and Sexual Activity   Alcohol use: Not Currently   Drug use: No   Sexual activity: Yes    Birth control/protection: None  Other Topics Concern   Not on file  Social History Narrative   Regular exercise: Yes   Social Determinants of Health   Financial Resource Strain: Low Risk  (10/08/2022)   Received from [facility]   Overall Financial Resource Strain (CARDIA)    Difficulty of Paying Living Expenses: Not hard at all  Food Insecurity: No Food Insecurity (10/08/2022)   Received from [facility]   Hunger Vital Sign    Worried About Running Out of Food in the Last Year: Never true    Ran Out of Food in the Last Year: Never true  Transportation Needs: No Transportation Needs (10/08/2022)   Received from [facility] - Transportation    In the past 12 months, has lack of transportation kept you from medical appointments or from getting medications?: No    Lack of Transportation (Non-Medical): No  Physical Activity: Not on file  Stress: Not on file  Social Connections: Unknown (10/02/2021)   Received from [facility]   Social Network    Social Network: Not on file  Intimate Partner Violence: Not At Risk (02/02/2022)   Humiliation, Afraid, Rape, and Kick questionnaire    Fear of Current or Ex-Partner: No    Emotionally Abused: No    Physically Abused: No    Sexually Abused: No    Past Surgical History:  Procedure Laterality Date   A/V FISTULAGRAM Left 06/11/2022   Procedure: A/V Fistulagram;  Surgeon: Annice Needy, MD;  Location: [facility];  Service: Cardiovascular;  Laterality: Left;   A/V FISTULAGRAM Left 10/02/2022   Procedure: A/V Fistulagram;  Surgeon: Annice Needy, MD;  Location: [facility];  Service: Cardiovascular;  Laterality: Left;   ANKLE SURGERY Right 1985   following volleyball injury    APPENDECTOMY     at the of 9 yrs   AV FISTULA PLACEMENT Left 01/04/2022   Procedure: ARTERIOVENOUS (AV) FISTULA CREATION;  Surgeon: Annice Needy, MD;  Location: [facility];  Service: Vascular;  Laterality: Left;   CAPD INSERTION N/A 08/10/2021   Procedure: LAPAROSCOPIC INSERTION CONTINUOUS AMBULATORY PERITONEAL DIALYSIS  (CAPD) CATHETER;  Surgeon: Leafy Ro, MD;  Location: [facility];  Service: General;  Laterality: N/A;   CAPD REVISION N/A 09/21/2021  Procedure: LAPAROSCOPIC REVISION CONTINUOUS AMBULATORY PERITONEAL DIALYSIS  (CAPD) CATHETER;  Surgeon: Leafy Ro, MD;  Location: [facility];  Service: General;  Laterality: N/A;   CATARACT EXTRACTION Left    COLONOSCOPY     COLONOSCOPY WITH PROPOFOL N/A 01/02/2017   Procedure: COLONOSCOPY WITH PROPOFOL;  Surgeon: Scot Jun, MD;  Location[facility];  Service: Endoscopy;  Laterality: N/A;   DIALYSIS/PERMA CATHETER INSERTION N/A 03/27/2021   Procedure: DIALYSIS/PERMA CATHETER INSERTION;  Surgeon: Annice Needy, MD;  Location: [facility];  Service: Cardiovascular;  Laterality: N/A;   DIALYSIS/PERMA CATHETER INSERTION N/A 10/10/2021   Procedure: DIALYSIS/PERMA CATHETER INSERTION;  Surgeon: Renford Dills, MD;  Location: [facility];  Service: Cardiovascular;  Laterality: N/A;   DIALYSIS/PERMA CATHETER REMOVAL N/A 05/08/2021   Procedure: DIALYSIS/PERMA CATHETER REMOVAL;  Surgeon: Annice Needy, MD;  Location: [facility];  Service: Cardiovascular;  Laterality: N/A;   DIALYSIS/PERMA CATHETER REMOVAL N/A 04/17/2022   Procedure: DIALYSIS/PERMA CATHETER REMOVAL;  Surgeon: Renford Dills, MD;  Location: [facility];  Service: Cardiovascular;  Laterality: N/A;   INSERTION OF MESH  08/10/2021   Procedure: INSERTION OF MESH;  Surgeon: Leafy Ro, MD;  Location: [facility];  Service: General;;   JOINT REPLACEMENT     RIGHT TOTAL HIP   KNEE ARTHROSCOPY WITH LATERAL RELEASE Right 04/20/2021    Procedure: Right knee medial retinaculum and lateral release with polyethylene exchange;  Surgeon: Kennedy Bucker, MD;  Location: [facility];  Service: Orthopedics;  Laterality: Right;   TONSILLECTOMY     as a child   TOTAL HIP ARTHROPLASTY  09/2009   Right femoral neck fracture   TOTAL HIP ARTHROPLASTY Left 03/06/2016   Procedure: TOTAL HIP ARTHROPLASTY ANTERIOR APPROACH;  Surgeon: Kennedy Bucker, MD;  Location: [facility];  Service: Orthopedics;  Laterality: Left;   TOTAL KNEE ARTHROPLASTY Right 03/21/2021   Procedure: TOTAL KNEE ARTHROPLASTY;  Surgeon: Kennedy Bucker, MD;  Location: [facility];  Service: Orthopedics;  Laterality: Right;   UMBILICAL HERNIA REPAIR  08/10/2021   Procedure: HERNIA REPAIR UMBILICAL ADULT;  Surgeon: Leafy Ro, MD;  Location: [facility];  Service: General;;    Family History  Problem Relation Age of Onset   Cirrhosis Mother    Other Father        Lung Fibrosis   Prostate cancer Neg Hx    Kidney cancer Neg Hx    Bladder Cancer Neg Hx     No Known Allergies     Latest Ref Rng & Units 08/20/2022    9:53 AM 02/03/2022    9:29 AM 02/02/2022   10:06 AM  CBC  WBC 4.0 - 10.5 K/uL 5.9  6.1  6.3   Hemoglobin 13.0 - 17.0 g/dL 08.6  9.0  9.3   Hematocrit 39.0 - 52.0 % 31.3  25.8  26.9   Platelets 150 - 400 K/uL 188  132  128       CMP     Component Value Date/Time   NA 131 (L) 08/20/2022 0953   NA 139 02/04/2014 1449   K 4.4 08/20/2022 0953   K 4.6 02/04/2014 1449   CL 91 (L) 08/20/2022 0953   CL 105 02/04/2014 1449   CO2 30 08/20/2022 0953   CO2 27 02/04/2014 1449   GLUCOSE 99 08/20/2022 0953   GLUCOSE 84 02/04/2014 1449   BUN 52 (H) 08/20/2022 0953   BUN 23 (H) 02/04/2014 1449   CREATININE 6.06 (H) 08/20/2022  1610   CREATININE 1.25 02/04/2014 1449   CALCIUM 9.4 08/20/2022 0953   CALCIUM 8.7 02/04/2014 1449   PROT 7.4 01/31/2022 1723   ALBUMIN 3.8 01/31/2022 1723   AST 14 (L) 01/31/2022 1723   ALT 12 01/31/2022 1723   ALKPHOS 77 01/31/2022 1723    BILITOT 1.4 (H) 01/31/2022 1723   GFRNONAA 9 (L) 08/20/2022 0953   GFRNONAA >60 02/04/2014 1449     No results found.     Assessment & Plan:   1. ESRD (end stage renal disease) (HCC) Recommend:  The patient is doing well and currently has adequate dialysis access. The patient's dialysis center is not reporting any access issues. Flow pattern is stable when compared to the prior ultrasound.  The patient should have a duplex ultrasound of the dialysis access in 6 months. The patient will follow-up with me in the office after each ultrasound    2. Hyperlipidemia, unspecified hyperlipidemia type Continue statin as ordered and reviewed, no changes at this time  3. Benign hypertension with chronic kidney disease, stage III (HCC) Continue antihypertensive medications as already ordered, these medications have been reviewed and there are no changes at this time.   Current Outpatient Medications on File Prior to Visit  Medication Sig Dispense Refill   apixaban (ELIQUIS) 2.5 MG TABS tablet Take 2.5 mg by mouth 2 (two) times daily.     B Complex Vitamins (B-COMPLEX/B-12 PO) Take 1,000 mcg by mouth.     calcitRIOL (ROCALTROL) 0.25 MCG capsule Take 0.25 mcg by mouth daily.     carvedilol (COREG) 3.125 MG tablet Take 6.25 mg by mouth daily.     cholecalciferol (VITAMIN D3) 25 MCG (1000 UNIT) tablet Take 1,000 Units by mouth daily.     cyanocobalamin (VITAMIN B12) 1000 MCG tablet Take 2,500 mcg by mouth daily.     Multiple Vitamin (MULTIVITAMIN WITH MINERALS) TABS tablet Take 1 tablet by mouth daily.     rosuvastatin (CRESTOR) 10 MG tablet Take 10 mg by mouth at bedtime.     Saccharomyces boulardii (PROBIOTIC) 250 MG CAPS Take 1 capsule by mouth in the morning and at bedtime. 12 capsule 0   sevelamer carbonate (RENVELA) 800 MG tablet Take 1,600 mg by mouth 3 (three) times daily.     torsemide (DEMADEX) 20 MG tablet Take 2 tablets (40 mg total) by mouth daily. 60 tablet 0   traZODone  (DESYREL) 50 MG tablet Take 50 mg by mouth at bedtime as needed for sleep.     No current facility-administered medications on file prior to visit.    There are no Patient Instructions on file for this visit. No follow-ups on file.   Georgiana Spinner, NP

## 2022-11-20 DIAGNOSIS — Z992 Dependence on renal dialysis: Secondary | ICD-10-CM | POA: Diagnosis not present

## 2022-11-20 DIAGNOSIS — N186 End stage renal disease: Secondary | ICD-10-CM | POA: Diagnosis not present

## 2022-11-22 DIAGNOSIS — N186 End stage renal disease: Secondary | ICD-10-CM | POA: Diagnosis not present

## 2022-11-22 DIAGNOSIS — Z992 Dependence on renal dialysis: Secondary | ICD-10-CM | POA: Diagnosis not present

## 2022-11-24 DIAGNOSIS — Z992 Dependence on renal dialysis: Secondary | ICD-10-CM | POA: Diagnosis not present

## 2022-11-24 DIAGNOSIS — N186 End stage renal disease: Secondary | ICD-10-CM | POA: Diagnosis not present

## 2022-11-26 DIAGNOSIS — Z992 Dependence on renal dialysis: Secondary | ICD-10-CM | POA: Diagnosis not present

## 2022-11-26 DIAGNOSIS — N186 End stage renal disease: Secondary | ICD-10-CM | POA: Diagnosis not present

## 2022-11-28 DIAGNOSIS — N186 End stage renal disease: Secondary | ICD-10-CM | POA: Diagnosis not present

## 2022-11-28 DIAGNOSIS — Z992 Dependence on renal dialysis: Secondary | ICD-10-CM | POA: Diagnosis not present

## 2022-12-01 DIAGNOSIS — Z992 Dependence on renal dialysis: Secondary | ICD-10-CM | POA: Diagnosis not present

## 2022-12-01 DIAGNOSIS — N186 End stage renal disease: Secondary | ICD-10-CM | POA: Diagnosis not present

## 2022-12-04 DIAGNOSIS — Z992 Dependence on renal dialysis: Secondary | ICD-10-CM | POA: Diagnosis not present

## 2022-12-04 DIAGNOSIS — N186 End stage renal disease: Secondary | ICD-10-CM | POA: Diagnosis not present

## 2022-12-06 DIAGNOSIS — Z992 Dependence on renal dialysis: Secondary | ICD-10-CM | POA: Diagnosis not present

## 2022-12-06 DIAGNOSIS — N186 End stage renal disease: Secondary | ICD-10-CM | POA: Diagnosis not present

## 2022-12-10 DIAGNOSIS — Z992 Dependence on renal dialysis: Secondary | ICD-10-CM | POA: Diagnosis not present

## 2022-12-10 DIAGNOSIS — N186 End stage renal disease: Secondary | ICD-10-CM | POA: Diagnosis not present

## 2022-12-13 DIAGNOSIS — N186 End stage renal disease: Secondary | ICD-10-CM | POA: Diagnosis not present

## 2022-12-13 DIAGNOSIS — Z992 Dependence on renal dialysis: Secondary | ICD-10-CM | POA: Diagnosis not present

## 2022-12-15 DIAGNOSIS — N186 End stage renal disease: Secondary | ICD-10-CM | POA: Diagnosis not present

## 2022-12-15 DIAGNOSIS — Z992 Dependence on renal dialysis: Secondary | ICD-10-CM | POA: Diagnosis not present

## 2022-12-17 DIAGNOSIS — M19011 Primary osteoarthritis, right shoulder: Secondary | ICD-10-CM | POA: Diagnosis not present

## 2022-12-17 DIAGNOSIS — M7581 Other shoulder lesions, right shoulder: Secondary | ICD-10-CM | POA: Diagnosis not present

## 2022-12-17 DIAGNOSIS — M25511 Pain in right shoulder: Secondary | ICD-10-CM | POA: Diagnosis not present

## 2022-12-17 DIAGNOSIS — M778 Other enthesopathies, not elsewhere classified: Secondary | ICD-10-CM | POA: Diagnosis not present

## 2022-12-18 DIAGNOSIS — Z992 Dependence on renal dialysis: Secondary | ICD-10-CM | POA: Diagnosis not present

## 2022-12-18 DIAGNOSIS — N186 End stage renal disease: Secondary | ICD-10-CM | POA: Diagnosis not present

## 2022-12-20 DIAGNOSIS — N186 End stage renal disease: Secondary | ICD-10-CM | POA: Diagnosis not present

## 2022-12-20 DIAGNOSIS — Z992 Dependence on renal dialysis: Secondary | ICD-10-CM | POA: Diagnosis not present

## 2022-12-22 DIAGNOSIS — Z992 Dependence on renal dialysis: Secondary | ICD-10-CM | POA: Diagnosis not present

## 2022-12-22 DIAGNOSIS — N186 End stage renal disease: Secondary | ICD-10-CM | POA: Diagnosis not present

## 2022-12-24 DIAGNOSIS — Z992 Dependence on renal dialysis: Secondary | ICD-10-CM | POA: Diagnosis not present

## 2022-12-24 DIAGNOSIS — N186 End stage renal disease: Secondary | ICD-10-CM | POA: Diagnosis not present

## 2022-12-27 DIAGNOSIS — N186 End stage renal disease: Secondary | ICD-10-CM | POA: Diagnosis not present

## 2022-12-27 DIAGNOSIS — Z992 Dependence on renal dialysis: Secondary | ICD-10-CM | POA: Diagnosis not present

## 2022-12-29 DIAGNOSIS — N186 End stage renal disease: Secondary | ICD-10-CM | POA: Diagnosis not present

## 2022-12-29 DIAGNOSIS — Z992 Dependence on renal dialysis: Secondary | ICD-10-CM | POA: Diagnosis not present

## 2023-01-01 DIAGNOSIS — Z992 Dependence on renal dialysis: Secondary | ICD-10-CM | POA: Diagnosis not present

## 2023-01-01 DIAGNOSIS — N186 End stage renal disease: Secondary | ICD-10-CM | POA: Diagnosis not present

## 2023-01-03 ENCOUNTER — Ambulatory Visit: Payer: Medicare HMO | Admitting: Urology

## 2023-01-03 DIAGNOSIS — Z992 Dependence on renal dialysis: Secondary | ICD-10-CM | POA: Diagnosis not present

## 2023-01-03 DIAGNOSIS — N186 End stage renal disease: Secondary | ICD-10-CM | POA: Diagnosis not present

## 2023-01-03 DIAGNOSIS — D509 Iron deficiency anemia, unspecified: Secondary | ICD-10-CM | POA: Diagnosis not present

## 2023-01-03 DIAGNOSIS — D631 Anemia in chronic kidney disease: Secondary | ICD-10-CM | POA: Diagnosis not present

## 2023-01-07 ENCOUNTER — Other Ambulatory Visit: Payer: Self-pay

## 2023-01-07 ENCOUNTER — Other Ambulatory Visit (HOSPITAL_COMMUNITY): Payer: Self-pay

## 2023-01-07 NOTE — Progress Notes (Signed)
 01/24/2018 12:34 PM   Lawrence Perkins. 08-09-1940 [id number]  Referring provider: Lenon Layman ORN, MD [address] [facility] [place] [facility] [place],  [place] [address]  Urological history 1. BPH with LU TS - PSA 0.95 in 2016 - discontinued screening due to age -cysto (03/2020) -moderate size prostate with obstructing lateral lobes with moderate bladder trabeculations -prostate volume 40 grms - CT, 2022 - managed with tamsulosin  0.4 mg daily  2. High risk hematuria -former smoker -non-contrast CT, 2022 (no contrast due to CKD) - no worrisome findings -cysto, 2022 - subtle dilation of ejaculatory duct but no definite papillary tumors -urine cytology, 2022 - NED -RUS (06/2022) -bilateral renal cysts -cysto (06/2022) - NED   3. Nephrolithiasis -2 mm right lower pole stone on 2023 CT  4. Renal cyst -Exophytic right lower pole renal cyst which appears to be involuting is slightly decreased in size compared to prior, now measuring 1.8 x 1.1 cm previously 1.8 x 1.5 cm on 2022 CT -non contrast CT 2023 Bilateral renal cysts are present measuring up to 17 mm  Chief Complaint  Patient presents with   Follow-up    follow-up    HPI: Lawrence Perkins. is a 83 y.o. male who presents today for 106-month follow-up.  Previous records reviewed.     He underwent a hematuria workup in June with renal ultrasound and cystoscopy.  Findings were positive for bilateral renal cysts.  He is having dialysis Tuesday Thursday and Saturdays.  He states that is going well for him.  He is still producing urine, but it is in decreasing amounts.  Patient denies any modifying or aggravating factors.  Patient denies any recent UTI's, gross hematuria, dysuria or suprapubic/flank pain.  Patient denies any fevers, chills, nausea or vomiting.    I PSS 5/0  PVR 42 mL   He has not noted any worsening of his urinary symptoms since the discontinuation of the tamsulosin  0.4 mg daily.  UA  nitrite negative, greater than 30 WBCs, 3-10 RBCs, hyaline cast present and a few bacteria   IPSS     Row Name 01/09/23 1100         International Prostate Symptom Score   How often have you had the sensation of not emptying your bladder? Not at All     How often have you had to urinate less than every two hours? Not at All     How often have you found you stopped and started again several times when you urinated? Not at All     How often have you found it difficult to postpone urination? Not at All     How often have you had a weak urinary stream? About half the time     How often have you had to strain to start urination? Not at All     How many times did you typically get up at night to urinate? 2 Times     Total IPSS Score 5       Quality of Life due to urinary symptoms   If you were to spend the rest of your life with your urinary condition just the way it is now how would you feel about that? Pleased              Score:  1-7 Mild 8-19 Moderate 20-35 Severe    PMH: Past Medical History:  Diagnosis Date   Anemia    Aortic atherosclerosis (HCC)  Arthritis    Atrial fibrillation and flutter (HCC)    a.) CHA2DS2-VASc = 5 (age x 2, CHF, HTN, aortic plaque). b.) rate/rhythm maintain on oral carvedilol ; chronically anticoagulated with dose reduced apixaban    Bilateral inguinal hernia    BPH (benign prostatic hyperplasia)    Cardiac syncope    Cardiomyopathy (HCC)    a.) TTE 04/23/2016: EF 40%. b.) TTE 09/03/2016: EF 50%. c.) TTE 05/04/2019: EF >55%.   CHF (congestive heart failure) (HCC)    a.) TTE 04/23/2016: EF 40%; BAE; mild AR/TR, mod MR; G1DD. b.) TTE 09/03/2016: EF 50%; mild LVH; BAE; triv AR/PR, mod MR/TR. c.) TTE 05/04/2019: EF >55%; mod LVH, mild LA enlargement; mild AR/MR/TR   CKD (chronic kidney disease), stage V (HCC)    Complication of anesthesia    a.) h/o intra/postoperative A.fib   Coronary artery disease    Diverticulosis    Gout    History of  2019 novel coronavirus disease (COVID-19) 11/14/2020   History of colon polyps    Hypercholesteremia    Hypertension    Long term current use of anticoagulant    a.) dose reduced apixaban ; dose reduction d/t to age and CKD Dx.   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[facility])    a.) s/p resection   MGUS (monoclonal gammopathy of unknown significance)    Pleural effusion    Rectal bleeding    Secondary hyperparathyroidism of renal origin [facility])     Surgical History: Past Surgical History:  Procedure Laterality Date   A/V FISTULAGRAM Left 06/11/2022   Procedure: A/V Fistulagram;  Surgeon: Marea Selinda RAMAN, MD;  Location: [facility] INVASIVE CV LAB;  Service: Cardiovascular;  Laterality: Left;   A/V FISTULAGRAM Left 10/02/2022   Procedure: A/V Fistulagram;  Surgeon: Marea Selinda RAMAN, MD;  Location: [facility] INVAS[facility] CV LAB;  Service: Cardiovascular;  Laterality: Left;   ANKLE SURGERY Right 1985   following volleyball injury   APPENDECTOMY     at the of 9 yrs   AV FISTULA PLACEMENT Left 01/04/2022   Procedure: ARTERIOVENOUS (AV) FISTULA CREATION;  Surgeon: Marea Selinda RAMAN, MD;  Location: [facility] ORS;  Service: Vascular;  Laterality: Left;   CAPD INSERTION N/A 08/10/2021   Procedure: LAPAROSCOPIC INSERTION CONTINUOUS AMBULATORY PERITONEAL DIALYSIS  (CAPD) CATHETER;  Surgeon: Jordis Laneta FALCON, MD;  Location: [facility] ORS;  Service: General;  Laterality: N/A;   CAPD REVISION N/A 09/21/2021   Procedure: LAPAROSCOPIC REVISION CONTINUOUS AMBULATORY PERITONEAL DIALYSIS  (CAPD) CATHETER;  Surgeon: Jordis Laneta FALCON, MD;  Location: [facility] ORS;  Service: General;  Laterality: N/A;   CATARACT EXTRACTION Left    COLONOSCOPY     COLONOSCOPY WITH PROPOFOL  N/A 01/02/2017   Procedure: COLONOSCOPY WITH PROPOFOL ;  Surgeon: Viktoria Lamar DASEN, MD;  Location: [facility] ENDOSCOPY;  Service: Endoscopy;  Laterality: N/A;   DIALYSIS/PERMA CATHETER INSERTION N/A 03/27/2021   Procedure: DIALYSIS/PERMA CATHETER INSERTION;  Surgeon: Marea Selinda RAMAN, MD;  Location: [facility] INVAS[facility] CV  LAB;  Service: Cardiovascular;  Laterality: N/A;   DIALYSIS/PERMA CATHETER INSERTION N/A 10/10/2021   Procedure: DIALYSIS/PERMA CATHETER INSERTION;  Surgeon: Jama Cordella MATSU, MD;  Location: [facility] CV [facility];  Service: Cardiovascular;  Laterality: N/A;   DIALYSIS/PERMA CATHETER REMOVAL N/A 05/08/2021   Procedure: DIALYSIS/PERMA CATHETER REMOVAL;  Surgeon: Marea Selinda RAMAN, MD;  Location: [facility] CV LAB;  Service: Cardiovascular;  Laterality: N/A;   DIALYSIS/PERMA CATHETER REMOVAL N/A 04/17/2022   Procedure: DIALYSIS/PERMA CATHETER REMOVAL;  Surgeon: Jama Cordella MATSU, MD;  Location: [facility] CV [facility];  Service: Cardiovascular;  Laterality:  N/A;   INSERTION OF MESH  08/10/2021   Procedure: INSERTION OF MESH;  Surgeon: Jordis Laneta FALCON, MD;  Location: [facility] ORS;  Service: General;;   JOINT REPLACEMENT     RIGHT TOTAL HIP   KNEE ARTHROSCOPY WITH LATERAL RELEASE Right 04/20/2021   Procedure: Right knee medial retinaculum and lateral release with polyethylene exchange;  Surgeon: Kathlynn Sharper, MD;  Location: [facility] ORS;  Service: Orthopedics;  Laterality: Right;   TONSILLECTOMY     as a child   TOTAL HIP ARTHROPLASTY  09/2009   Right femoral neck fracture   TOTAL HIP ARTHROPLASTY Left 03/06/2016   Procedure: TOTAL HIP ARTHROPLASTY ANTERIOR APPROACH;  Surgeon: Sharper Kathlynn, MD;  Location: [facility] ORS;  Service: Orthopedics;  Laterality: Left;   TOTAL KNEE ARTHROPLASTY Right 03/21/2021   Procedure: TOTAL KNEE ARTHROPLASTY;  Surgeon: Kathlynn Sharper, MD;  Location: [facility] ORS;  Service: Orthopedics;  Laterality: Right;   UMBILICAL HERNIA REPAIR  08/10/2021   Procedure: HERNIA REPAIR UMBILICAL ADULT;  Surgeon: Jordis Laneta FALCON, MD;  Location: [facility] ORS;  Service: General;;    Home Medications:  Allergies as of 01/09/2023   No Known Allergies      Medication List        Accurate as of January 09, 2023 12:34 PM. If you have any questions, ask your nurse or doctor.          apixaban  2.5 MG Tabs  tablet Commonly known as: ELIQUIS  Take 2.5 mg by mouth 2 (two) times daily.   B-COMPLEX/B-12 PO Take 1,000 mcg by mouth.   calcitRIOL  0.25 MCG capsule Commonly known as: ROCALTROL  Take 0.25 mcg by mouth daily.   carvedilol  3.125 MG tablet Commonly known as: COREG  Take 6.25 mg by mouth daily.   cholecalciferol  25 MCG (1000 UNIT) tablet Commonly known as: VITAMIN D3 Take 1,000 Units by mouth daily.   cyanocobalamin  1000 MCG tablet Commonly known as: VITAMIN B12 Take 2,500 mcg by mouth daily.   multivitamin with minerals Tabs tablet Take 1 tablet by mouth daily.   Probiotic 250 MG Caps Take 1 capsule by mouth in the morning and at bedtime.   rosuvastatin  10 MG tablet Commonly known as: CRESTOR  Take 10 mg by mouth at bedtime.   sevelamer  carbonate 800 MG tablet Commonly known as: RENVELA  Take 1,600 mg by mouth 3 (three) times daily.   torsemide  20 MG tablet Commonly known as: DEMADEX  Take 2 tablets (40 mg total) by mouth daily.   traZODone  50 MG tablet Commonly known as: DESYREL  Take 50 mg by mouth at bedtime as needed for sleep.        Allergies: No Known Allergies  Family History: Family History  Problem Relation Age of Onset   Cirrhosis Mother    Other Father        Lung Fibrosis   Prostate cancer Neg Hx    Kidney cancer Neg Hx    Bladder Cancer Neg Hx     Social History:  reports that he quit smoking about 42 years ago. His smoking use included cigarettes. He started smoking about 57 years ago. He has been exposed to tobacco smoke. He has never used smokeless tobacco. He reports that he does not currently use alcohol. He reports that he does not use drugs.  For pertinent review of systems please refer to history of present illness  Physical Exam: BP (!) 143/78   Pulse 65   Ht 5' 10 (1.778 m)   Wt 182 lb (82.6 kg)   BMI  26.11 kg/m   Constitutional:  Well nourished. Alert and oriented, No acute distress. HEENT: [place] AT, moist mucus membranes.   Trachea midline Cardiovascular: No clubbing, cyanosis, or edema. Respiratory: Normal respiratory effort, no increased work of breathing. Neurologic: Grossly intact, no focal deficits, moving all 4 extremities. Psychiatric: Normal mood and affect.   Laboratory Data: Comprehensive Metabolic Panel (CMP) Order: [id number] Component Ref Range & Units 3 mo ago  Glucose 70 - 110 mg/dL 92  Sodium 863 - 854 mmol/L 141  Potassium 3.6 - 5.1 mmol/L 4.1  Chloride 97 - 109 mmol/L 99  Carbon Dioxide (CO2) 22.0 - 32.0 mmol/L 33.3 High   Urea  Nitrogen (BUN) 7 - 25 mg/dL 36 High   Creatinine 0.7 - 1.3 mg/dL 5.2 High   Glomerular Filtration Rate (eGFR) >60 mL/min/1.73sq m 10 Low   Comment: CKD-EPI (2021) does not include patient's race in the calculation of eGFR.  Monitoring changes of plasma creatinine and eGFR over time is useful for monitoring kidney function.  Interpretive Ranges for eGFR (CKD-EPI 2021):  eGFR:       >60 mL/min/1.73 sq. m - Normal eGFR:       30-59 mL/min/1.73 sq. m - Moderately Decreased eGFR:       15-29 mL/min/1.73 sq. m  - Severely Decreased eGFR:       < 15 mL/min/1.73 sq. m  - Kidney Failure   Note: These eGFR calculations do not apply in acute situations when eGFR is changing rapidly or patients on dialysis.  Calcium  8.7 - 10.3 mg/dL 9.6  AST 8 - 39 U/L 8  ALT 6 - 57 U/L 8  Alk Phos (alkaline Phosphatase) 34 - 104 U/L 82  Albumin 3.5 - 4.8 g/dL 4.1  Bilirubin, Total 0.3 - 1.2 mg/dL 0.7  Protein, Total 6.1 - 7.9 g/dL 6.5  A/G Ratio 1.0 - 5.0 gm/dL 1.7  Resulting Agency [facility]   Specimen Collected: 10/01/22 07:40   Performed by: [facility] Last Resulted: 10/01/22 12:03  Received From: [facility]  Result Received: 10/01/22 14:13   Hemoglobin A1C Order: [id number] Component Ref Range & Units 3 mo ago  Hemoglobin A1C 4.2 - 5.6 % 5.1  Average Blood Glucose (Calc) mg/dL 899  Resulting Agency  [facility]  Narrative Performed by [facility] Normal Range:    4.2 - 5.6% Increased Risk:  5.7 - 6.4% Diabetes:        >= 6.5% Glycemic Control for adults with diabetes:  <7%    Specimen Collected: 10/01/22 07:40   Performed by: [facility] Resulted: 10/01/22 10:54  Received From: [facility]  Result Received: 10/01/22 14:13   Lipid Panel w/calc LDL Order: [id number] Component Ref Range & Units 3 mo ago  Cholesterol, Total 100 - 200 mg/dL 89 Low   Triglyceride 35 - 199 mg/dL 895  HDL (High Density Lipoprotein) Cholesterol 29.0 - 71.0 mg/dL 51.9  LDL Calculated 0 - 130 mg/dL 20  VLDL Cholesterol mg/dL 21  Cholesterol/HDL Ratio 1.9  Resulting Agency [facility]   Specimen Collected: 10/01/22 07:40   Performed by: [facility] Resulted: 10/01/22 12:03  Received From: [facility]  Result Received: 10/01/22 14:13   Thyroid  Stimulating-Hormone (TSH) Order: [id number] Component Ref Range & Units 3 mo ago  Thyroid  Stimulating Hormone (TSH) 0.450-5.330 uIU/ml uIU/mL 2.755  Resulting Agency [facility]   Specimen Collected: 10/01/22 07:40   Performed by: [facility] Last Resulted: 10/01/22 11:26  Received From: [facility]  Result Received: 10/01/22 14:13  I have reviewed the labs.  Pertinent Imaging  01/09/23 11:31  Scan Result 42ml     Assessment & Plan:    1. High risk hematuria -work up 2022, NED -work up 2024, NED -no reports of gross heme -UA w/ micro heme, but very recent workup was negative -I did not send for culture as he is not having any UTI symptoms  2. BPH with LUTS -continue conservative management, avoiding bladder irritants and timed voiding's -He is not undergoing dialysis 3 days weekly and is not producing as much urine  3. Right renal stone -asymptomatic   Return in 1 year  (on 01/09/2024) for follow up in one year for ua, ipss, pvr.  These notes generated with voice recognition software. I apologize for typographical errors.  CLOTILDA HELON RIGGERS  [facility] [address], [address] [place], [place] [address] [phone]

## 2023-01-09 ENCOUNTER — Ambulatory Visit (INDEPENDENT_AMBULATORY_CARE_PROVIDER_SITE_OTHER): Payer: PPO | Admitting: Urology

## 2023-01-09 ENCOUNTER — Other Ambulatory Visit (HOSPITAL_COMMUNITY): Payer: Self-pay

## 2023-01-09 ENCOUNTER — Encounter: Payer: Self-pay | Admitting: Urology

## 2023-01-09 ENCOUNTER — Encounter (INDEPENDENT_AMBULATORY_CARE_PROVIDER_SITE_OTHER): Payer: Medicare HMO

## 2023-01-09 ENCOUNTER — Ambulatory Visit (INDEPENDENT_AMBULATORY_CARE_PROVIDER_SITE_OTHER): Payer: Medicare HMO | Admitting: Nurse Practitioner

## 2023-01-09 VITALS — BP 143/78 | HR 65 | Ht 70.0 in | Wt 182.0 lb

## 2023-01-09 DIAGNOSIS — N138 Other obstructive and reflux uropathy: Secondary | ICD-10-CM | POA: Diagnosis not present

## 2023-01-09 DIAGNOSIS — N200 Calculus of kidney: Secondary | ICD-10-CM | POA: Diagnosis not present

## 2023-01-09 DIAGNOSIS — N401 Enlarged prostate with lower urinary tract symptoms: Secondary | ICD-10-CM | POA: Diagnosis not present

## 2023-01-09 DIAGNOSIS — R319 Hematuria, unspecified: Secondary | ICD-10-CM | POA: Diagnosis not present

## 2023-01-09 LAB — BLADDER SCAN AMB NON-IMAGING

## 2023-01-09 MED ORDER — APIXABAN 2.5 MG PO TABS
2.5000 mg | ORAL_TABLET | Freq: Two times a day (BID) | ORAL | 3 refills | Status: AC
Start: 2022-04-18 — End: ?
  Filled 2023-01-21: qty 180, 90d supply, fill #0

## 2023-01-09 MED ORDER — ROSUVASTATIN CALCIUM 5 MG PO TABS
5.0000 mg | ORAL_TABLET | Freq: Every day | ORAL | 3 refills | Status: AC
Start: 2022-10-08 — End: ?

## 2023-01-10 ENCOUNTER — Other Ambulatory Visit (HOSPITAL_COMMUNITY): Payer: Self-pay

## 2023-01-10 LAB — URINALYSIS, COMPLETE
Bilirubin, UA: NEGATIVE
Glucose, UA: NEGATIVE
Ketones, UA: NEGATIVE
Nitrite, UA: NEGATIVE
Specific Gravity, UA: 1.02 (ref 1.005–1.030)
Urobilinogen, Ur: 0.2 mg/dL (ref 0.2–1.0)
pH, UA: 7.5 (ref 5.0–7.5)

## 2023-01-10 LAB — MICROSCOPIC EXAMINATION: WBC, UA: >30 /[HPF] — AB (ref 0–5)

## 2023-01-10 NOTE — Progress Notes (Addendum)
 Established Patient Visit   Chief Complaint: Chief Complaint  Patient presents with  . Follow-up    6 months   Date of Service: 01/14/2023 Date of Birth: July 31, 1940 PCP: Lawrence Perkins [name], MD  History of Present Illness: Lawrence Perkins is a 83 y.o.male patient who presents for a 6 month follow up. PMH significant for end-stage renal disease, a-fib, cardiomyopathy, hypertension, coronary artery disease, hypercholesterolemia.  Today, pt presents with no new cardiac concerns. Denies having any problems with breathing or chest pains. Currently on dialysis due to ESRD. Will order calcium  score in the future to check on any possible build up around the heart. No new changes were made.     Visit Summaries: (07/09/2022) Patient was seen by me for a follow up and presented without any cardiac complaints. No changes were made   Past Medical and Surgical History  Past Medical History Past Medical History:  Diagnosis Date  . A-fib (CMS/HHS-HCC)   . Anemia in stage 4 chronic kidney disease  (CMS/HHS-HCC) 05/09/2021  . Atherosclerosis of abdominal aorta (CMS-HCC) 09/23/2017  . Atrial fibrillation (CMS/HHS-HCC)   . Atrial fibrillation (CMS/HHS-HCC)    intermittent with hip fx and GI bleed only at this point  . BPH (benign prostatic hypertrophy)    Followed by Dr. Ike  . Cardiac syncope 03/24/2019  . Cardiomyopathy, secondary (CMS/HHS-HCC)   . CHF (congestive heart failure) (CMS/HHS-HCC) 2018  . Chronic a-fib (CMS/HHS-HCC)   . Chronic kidney disease    Chronic Kidney disease, stage 4  . Colon polyp 2012   adenomatous with high grade dysplasia  . Coronary artery disease involving native coronary artery of native heart 09/23/2017  . COVID-19 11/14/2020  . Essential hypertension, benign   . Hx of adenomatous polyp of colon 10/01/2016  . Hx of colonic polyps    multiple including dysplasia  . Hyperplastic polyp of intestine    Hx of 8 including 1 transverse colon polyps x3  suggestive of adenomatous change and low grade dysplasia. There is colonic mucosa with prominent lymphoid agragate, deeper sections examined and neg for dysplasia there.  . Osteoporosis   . Paroxysmal A-fib (CMS/HHS-HCC)   . Pleural effusion   . Pleural effusion, bilateral   . Pure hypercholesterolemia     Past Surgical History He has a past surgical history that includes Appendectomy; Tonsillectomy; Hip Pinning; Colonoscopy (01/30/2010); Colonoscopy (09/01/2010); Colonoscopy (09/28/2013); Removal of deep hardware and right total hip replacement (Right, 02/18/2014); Total hip arthroplasty anterior approach (Left, 03/06/2016); Colonoscopy (01/02/2017); Fracture surgery; Cataract extraction; Hernia repair; Joint replacement; and Knee arthroscopy.   Medications and Allergies  Current Medications  Current Outpatient Medications  Medication Sig Dispense Refill  . acetaminophen  (TYLENOL ) 325 MG tablet Take 2 tablets by mouth as needed    . apixaban  (ELIQUIS ) 2.5 mg tablet Take 1 tablet (2.5 mg total) by mouth 2 (two) times daily 180 tablet 3  . calcitRIOL  (ROCALTROL ) 0.25 MCG capsule Take 0.25 mcg by mouth once daily    . calcium  carbonate-vitamin D3 (CALTRATE 600+D) 600 mg(1,500mg ) -200 unit tablet Take 1 tablet by mouth 2 (two) times daily with meals    . cholecalciferol  (VITAMIN D3) 2,000 unit capsule Take 2,000 Units by mouth once daily.    . cyanocobalamin  (VITAMIN B12) 1000 MCG tablet Take 1,000 mcg by mouth once daily    . docusate (COLACE) 100 MG capsule Take 1 capsule by mouth 2 (two) times daily    . multivit-min-folic-vit K-lycop (ONE-A-DAY MEN'S 50 PLUS) 400-20-370 mcg Tab Take  1 tablet by mouth once daily    . rosuvastatin  (CRESTOR ) 5 MG tablet Take 1 tablet (5 mg total) by mouth once daily 90 tablet 3  . Saccharomyces boulardii (FLORASTOR) 250 mg capsule Take 250 mg by mouth 2 (two) times daily    . sevelamer  carbonate (RENVELA ) 800 mg tablet Take 800 mg by mouth 2 (two) times daily  at lunch and dinner    . tamsulosin  (FLOMAX ) 0.4 mg capsule Take 0.4 mg by mouth once daily Take 30 minutes after same meal each day.    . TORsemide  (DEMADEX ) 20 MG tablet TAKE 3 TABLETS (60MG  TOTAL)ONCE DAILY 270 tablet 1  . traZODone  (DESYREL ) 50 MG tablet Take 1 tablet (50 mg total) by mouth at bedtime 90 tablet 3  . vit A/vit C/vit E/zinc /copper (PRESERVISION AREDS ORAL) Take 1 capsule by mouth 2 (two) times daily     No current facility-administered medications for this visit.    Allergies: Patient has no known allergies.  Social and Family History  Social History  reports that he quit smoking about 41 years ago. His smoking use included cigarettes. He has a 27 pack-year smoking history. He has never used smokeless tobacco. He reports that he does not currently use alcohol after a past usage of about 2.0 - 3.0 standard drinks of alcohol per week. He reports that he does not use drugs.  Family History Family History  Problem Relation Name Age of Onset  . Cirrhosis Mother Lawrence Perkins   . Alcohol abuse Mother Lawrence Perkins        died of Cirrhosis of the liver  . Liver disease Mother Lawrence Perkins   . Pulmonary fibrosis Father    . Lung cancer Father    . Stroke Brother Lawrence Perkins   . Breast cancer Daughter Lawrence Perkins        stage 1- ok now    Review of Systems   Pertinent positives and negatives are mentioned above in HPI and all other systems are negative.  Physical Examination   Vitals:BP (!) 148/64 (BP Location: Left upper arm, Patient Position: Sitting, BP Cuff Size: Adult)   Pulse 65   Resp 14   Ht 177.8 cm (5' 10)   Wt 81.1 kg (178 lb 12.8 oz)   SpO2 98%   BMI 25.66 kg/m  Ht:177.8 cm (5' 10) Wt:81.1 kg (178 lb 12.8 oz) ADJ:Anib surface area is 2 meters squared. Body mass index is 25.66 kg/m.  HEENT: Pupils equally reactive to light and accomodation  Neck: Supple without thyromegaly, carotid pulses 2+ Lungs: clear to auscultation bilaterally;  no wheezes, rales, rhonchi Heart: Regular rate and rhythm.  No gallops, murmurs or rub Abdomen: soft nontender, nondistended, with normal bowel sounds Extremities: no cyanosis, clubbing, or edema Peripheral Pulses: 2+ in all extremities, 2+ femoral pulses bilaterally Neurologic: Alert and oriented X3; speech intact; face symmetrical; moves all extremities well  Cardiovascular Studies:    Echocardiogram 2D complete: (10/11/2021) IMPRESSIONS   1. Left ventricular ejection fraction, by estimation, is 60 to 65%. The left ventricle has normal function. The left ventricle has no regional wall motion abnormalities. There is mild left ventricular hypertrophy. Left ventricular diastolic parameters  are indeterminate.   2. Right ventricular systolic function is normal. The right ventricular size is mildly enlarged. There is moderately elevated pulmonary artery systolic pressure. The estimated right ventricular systolic pressure is 47.4 mmHg.   3. Left atrial size was severely dilated.   4. Right atrial  size was mildly dilated.   5. The mitral valve is grossly normal. Mild mitral valve regurgitation.   6. Tricuspid valve regurgitation is mild to moderate.   7. The aortic valve is tricuspid. Aortic valve regurgitation is mild to moderate. Aortic valve sclerosis/calcification is present, without any evidence of aortic stenosis.   8. The inferior vena cava is dilated in size with >50% respiratory variability, suggesting right atrial pressure of 8 mmHg.   NM Myocardial Perfusion SPECT multiple (stress and rest): (05/04/2019) IMPRESSION: Treadmill EKG without evidence of ischemia or arrhythmia Normal myocardial perfusion without evidence of myocardial ischemia  Cardiac Catheterization:   Holter:  Cardiac CT Scan:  Cardiac MRI:   Assessment   83 y.o. male with   1. Coronary artery disease involving native coronary artery of native heart without angina pectoris   2. Atrial fibrillation,  unspecified type (CMS/HHS-HCC)   3. Essential hypertension   4. Mixed hyperlipidemia   5. ESRD (end stage renal disease) (CMS/HHS-HCC)     Plan   CAD, stable, continue current medical therapy with carvedilol , Eliquis , and rosuvastatin . Atrial fibrillation, continue Eliquis  for anticoagulation and carvedilol  for rate Hypertension, BP today is 148/64, continue current medical therapy with carvedilol  ESRD, on dialysis, follow up with nephrology Hyperlipidemia, continue Crestor  therapy for lipid management Chronic systolic congestive heart failure continue current therapy torsemide  consider ACE or ARB consider beta-blocker MGUS followed by hematology and primary physician Have the patient follow-up in 1 year    Return in about 1 year (around 01/14/2024).  This note is partially written by Leita Ellen, in the presence of and acting as the scribe of Dr. Cara Lovelace.      Leita Ellen  I have reviewed, edited and added to the note to reflect my best personal medical judgment.  Attestation Statement:   I personally performed the service. (TP)  DWAYNE JONETTA LOVELACE, MD  [facility] [place], [place] Ph:  [phone] Fax:  [phone] This note was generated in part with voice recognition software, Dragon.  I apologize for any typographical errors that were not detected and corrected from this process.  They are unintentional.

## 2023-01-14 ENCOUNTER — Other Ambulatory Visit (HOSPITAL_COMMUNITY): Payer: Self-pay

## 2023-01-14 ENCOUNTER — Other Ambulatory Visit: Payer: Self-pay | Admitting: Internal Medicine

## 2023-01-14 ENCOUNTER — Other Ambulatory Visit: Payer: Self-pay

## 2023-01-14 DIAGNOSIS — N184 Chronic kidney disease, stage 4 (severe): Secondary | ICD-10-CM | POA: Diagnosis not present

## 2023-01-14 DIAGNOSIS — I700 Atherosclerosis of aorta: Secondary | ICD-10-CM | POA: Diagnosis not present

## 2023-01-14 DIAGNOSIS — I4891 Unspecified atrial fibrillation: Secondary | ICD-10-CM

## 2023-01-14 DIAGNOSIS — N186 End stage renal disease: Secondary | ICD-10-CM | POA: Diagnosis not present

## 2023-01-14 DIAGNOSIS — I4821 Permanent atrial fibrillation: Secondary | ICD-10-CM | POA: Diagnosis not present

## 2023-01-14 DIAGNOSIS — I2510 Atherosclerotic heart disease of native coronary artery without angina pectoris: Secondary | ICD-10-CM | POA: Diagnosis not present

## 2023-01-14 DIAGNOSIS — I5022 Chronic systolic (congestive) heart failure: Secondary | ICD-10-CM | POA: Diagnosis not present

## 2023-01-14 DIAGNOSIS — I10 Essential (primary) hypertension: Secondary | ICD-10-CM | POA: Diagnosis not present

## 2023-01-14 DIAGNOSIS — D631 Anemia in chronic kidney disease: Secondary | ICD-10-CM | POA: Diagnosis not present

## 2023-01-14 DIAGNOSIS — I129 Hypertensive chronic kidney disease with stage 1 through stage 4 chronic kidney disease, or unspecified chronic kidney disease: Secondary | ICD-10-CM | POA: Diagnosis not present

## 2023-01-14 DIAGNOSIS — E782 Mixed hyperlipidemia: Secondary | ICD-10-CM | POA: Diagnosis not present

## 2023-01-14 MED ORDER — SEVELAMER CARBONATE 800 MG PO TABS
1600.0000 mg | ORAL_TABLET | Freq: Three times a day (TID) | ORAL | 11 refills | Status: AC
  Filled 2023-01-14: qty 180, 30d supply, fill #0

## 2023-01-15 ENCOUNTER — Other Ambulatory Visit: Payer: Self-pay

## 2023-01-16 ENCOUNTER — Ambulatory Visit
Admission: RE | Admit: 2023-01-16 | Discharge: 2023-01-16 | Disposition: A | Discharge status: Home / Self Care | Payer: Self-pay | Source: Ambulatory Visit | Attending: Internal Medicine | Admitting: Internal Medicine

## 2023-01-16 ENCOUNTER — Other Ambulatory Visit (HOSPITAL_COMMUNITY): Payer: Self-pay

## 2023-01-16 DIAGNOSIS — M47812 Spondylosis without myelopathy or radiculopathy, cervical region: Secondary | ICD-10-CM | POA: Diagnosis not present

## 2023-01-16 DIAGNOSIS — I4891 Unspecified atrial fibrillation: Secondary | ICD-10-CM | POA: Insufficient documentation

## 2023-01-16 DIAGNOSIS — M542 Cervicalgia: Secondary | ICD-10-CM | POA: Diagnosis not present

## 2023-01-16 DIAGNOSIS — M5030 Other cervical disc degeneration, unspecified cervical region: Secondary | ICD-10-CM | POA: Diagnosis not present

## 2023-01-18 DIAGNOSIS — M5030 Other cervical disc degeneration, unspecified cervical region: Secondary | ICD-10-CM | POA: Diagnosis not present

## 2023-01-21 ENCOUNTER — Other Ambulatory Visit (HOSPITAL_COMMUNITY): Payer: Self-pay

## 2023-01-21 ENCOUNTER — Other Ambulatory Visit: Payer: Self-pay

## 2023-01-25 DIAGNOSIS — M5030 Other cervical disc degeneration, unspecified cervical region: Secondary | ICD-10-CM | POA: Diagnosis not present

## 2023-01-29 ENCOUNTER — Other Ambulatory Visit: Payer: Self-pay | Admitting: Internal Medicine

## 2023-01-29 DIAGNOSIS — R931 Abnormal findings on diagnostic imaging of heart and coronary circulation: Secondary | ICD-10-CM

## 2023-01-30 DIAGNOSIS — M5030 Other cervical disc degeneration, unspecified cervical region: Secondary | ICD-10-CM | POA: Diagnosis not present

## 2023-02-01 DIAGNOSIS — N186 End stage renal disease: Secondary | ICD-10-CM | POA: Diagnosis not present

## 2023-02-01 DIAGNOSIS — L821 Other seborrheic keratosis: Secondary | ICD-10-CM | POA: Diagnosis not present

## 2023-02-01 DIAGNOSIS — D2272 Melanocytic nevi of left lower limb, including hip: Secondary | ICD-10-CM | POA: Diagnosis not present

## 2023-02-01 DIAGNOSIS — L814 Other melanin hyperpigmentation: Secondary | ICD-10-CM | POA: Diagnosis not present

## 2023-02-01 DIAGNOSIS — Z992 Dependence on renal dialysis: Secondary | ICD-10-CM | POA: Diagnosis not present

## 2023-02-01 DIAGNOSIS — Z85828 Personal history of other malignant neoplasm of skin: Secondary | ICD-10-CM | POA: Diagnosis not present

## 2023-02-01 DIAGNOSIS — D2261 Melanocytic nevi of right upper limb, including shoulder: Secondary | ICD-10-CM | POA: Diagnosis not present

## 2023-02-01 DIAGNOSIS — D2271 Melanocytic nevi of right lower limb, including hip: Secondary | ICD-10-CM | POA: Diagnosis not present

## 2023-02-01 DIAGNOSIS — Z85820 Personal history of malignant melanoma of skin: Secondary | ICD-10-CM | POA: Diagnosis not present

## 2023-02-01 DIAGNOSIS — D225 Melanocytic nevi of trunk: Secondary | ICD-10-CM | POA: Diagnosis not present

## 2023-02-01 DIAGNOSIS — Z08 Encounter for follow-up examination after completed treatment for malignant neoplasm: Secondary | ICD-10-CM | POA: Diagnosis not present

## 2023-02-01 DIAGNOSIS — D2262 Melanocytic nevi of left upper limb, including shoulder: Secondary | ICD-10-CM | POA: Diagnosis not present

## 2023-02-02 DIAGNOSIS — D509 Iron deficiency anemia, unspecified: Secondary | ICD-10-CM | POA: Diagnosis not present

## 2023-02-02 DIAGNOSIS — N186 End stage renal disease: Secondary | ICD-10-CM | POA: Diagnosis not present

## 2023-02-02 DIAGNOSIS — Z992 Dependence on renal dialysis: Secondary | ICD-10-CM | POA: Diagnosis not present

## 2023-02-02 DIAGNOSIS — D631 Anemia in chronic kidney disease: Secondary | ICD-10-CM | POA: Diagnosis not present

## 2023-02-06 DIAGNOSIS — M5030 Other cervical disc degeneration, unspecified cervical region: Secondary | ICD-10-CM | POA: Diagnosis not present

## 2023-02-07 ENCOUNTER — Ambulatory Visit: Admission: RE | Admit: 2023-02-07 | Payer: PPO | Source: Ambulatory Visit

## 2023-02-25 DIAGNOSIS — D3131 Benign neoplasm of right choroid: Secondary | ICD-10-CM | POA: Diagnosis not present

## 2023-02-25 DIAGNOSIS — H1045 Other chronic allergic conjunctivitis: Secondary | ICD-10-CM | POA: Diagnosis not present

## 2023-02-25 DIAGNOSIS — H353131 Nonexudative age-related macular degeneration, bilateral, early dry stage: Secondary | ICD-10-CM | POA: Diagnosis not present

## 2023-03-01 DIAGNOSIS — Z992 Dependence on renal dialysis: Secondary | ICD-10-CM | POA: Diagnosis not present

## 2023-03-01 DIAGNOSIS — N186 End stage renal disease: Secondary | ICD-10-CM | POA: Diagnosis not present

## 2023-03-02 DIAGNOSIS — Z992 Dependence on renal dialysis: Secondary | ICD-10-CM | POA: Diagnosis not present

## 2023-03-02 DIAGNOSIS — N186 End stage renal disease: Secondary | ICD-10-CM | POA: Diagnosis not present

## 2023-03-02 DIAGNOSIS — D631 Anemia in chronic kidney disease: Secondary | ICD-10-CM | POA: Diagnosis not present

## 2023-03-02 DIAGNOSIS — D509 Iron deficiency anemia, unspecified: Secondary | ICD-10-CM | POA: Diagnosis not present

## 2023-04-01 DIAGNOSIS — I120 Hypertensive chronic kidney disease with stage 5 chronic kidney disease or end stage renal disease: Secondary | ICD-10-CM | POA: Diagnosis not present

## 2023-04-01 DIAGNOSIS — N186 End stage renal disease: Secondary | ICD-10-CM | POA: Diagnosis not present

## 2023-04-01 DIAGNOSIS — Z992 Dependence on renal dialysis: Secondary | ICD-10-CM | POA: Diagnosis not present

## 2023-04-01 DIAGNOSIS — N185 Chronic kidney disease, stage 5: Secondary | ICD-10-CM | POA: Diagnosis not present

## 2023-04-01 DIAGNOSIS — R7303 Prediabetes: Secondary | ICD-10-CM | POA: Diagnosis not present

## 2023-04-01 DIAGNOSIS — I5022 Chronic systolic (congestive) heart failure: Secondary | ICD-10-CM | POA: Diagnosis not present

## 2023-04-01 DIAGNOSIS — I2510 Atherosclerotic heart disease of native coronary artery without angina pectoris: Secondary | ICD-10-CM | POA: Diagnosis not present

## 2023-04-02 DIAGNOSIS — Z992 Dependence on renal dialysis: Secondary | ICD-10-CM | POA: Diagnosis not present

## 2023-04-02 DIAGNOSIS — D631 Anemia in chronic kidney disease: Secondary | ICD-10-CM | POA: Diagnosis not present

## 2023-04-02 DIAGNOSIS — N186 End stage renal disease: Secondary | ICD-10-CM | POA: Diagnosis not present

## 2023-04-02 DIAGNOSIS — D509 Iron deficiency anemia, unspecified: Secondary | ICD-10-CM | POA: Diagnosis not present

## 2023-04-08 DIAGNOSIS — D472 Monoclonal gammopathy: Secondary | ICD-10-CM | POA: Diagnosis not present

## 2023-04-08 DIAGNOSIS — I5022 Chronic systolic (congestive) heart failure: Secondary | ICD-10-CM | POA: Diagnosis not present

## 2023-04-08 DIAGNOSIS — R7303 Prediabetes: Secondary | ICD-10-CM | POA: Diagnosis not present

## 2023-04-08 DIAGNOSIS — N185 Chronic kidney disease, stage 5: Secondary | ICD-10-CM | POA: Diagnosis not present

## 2023-04-08 DIAGNOSIS — I2510 Atherosclerotic heart disease of native coronary artery without angina pectoris: Secondary | ICD-10-CM | POA: Diagnosis not present

## 2023-04-08 DIAGNOSIS — I700 Atherosclerosis of aorta: Secondary | ICD-10-CM | POA: Diagnosis not present

## 2023-04-08 DIAGNOSIS — I120 Hypertensive chronic kidney disease with stage 5 chronic kidney disease or end stage renal disease: Secondary | ICD-10-CM | POA: Diagnosis not present

## 2023-04-08 DIAGNOSIS — I4891 Unspecified atrial fibrillation: Secondary | ICD-10-CM | POA: Diagnosis not present

## 2023-04-10 ENCOUNTER — Other Ambulatory Visit (INDEPENDENT_AMBULATORY_CARE_PROVIDER_SITE_OTHER): Payer: Self-pay | Admitting: Vascular Surgery

## 2023-04-10 DIAGNOSIS — N186 End stage renal disease: Secondary | ICD-10-CM

## 2023-04-12 ENCOUNTER — Ambulatory Visit (INDEPENDENT_AMBULATORY_CARE_PROVIDER_SITE_OTHER): Payer: Self-pay

## 2023-04-12 ENCOUNTER — Ambulatory Visit (INDEPENDENT_AMBULATORY_CARE_PROVIDER_SITE_OTHER): Payer: Self-pay | Admitting: Nurse Practitioner

## 2023-04-12 VITALS — BP 124/73 | HR 75 | Resp 17 | Ht 70.0 in | Wt 180.0 lb

## 2023-04-12 DIAGNOSIS — N186 End stage renal disease: Secondary | ICD-10-CM | POA: Diagnosis not present

## 2023-04-12 DIAGNOSIS — E785 Hyperlipidemia, unspecified: Secondary | ICD-10-CM

## 2023-04-15 ENCOUNTER — Encounter (INDEPENDENT_AMBULATORY_CARE_PROVIDER_SITE_OTHER): Payer: Self-pay | Admitting: Nurse Practitioner

## 2023-04-15 NOTE — Progress Notes (Signed)
 Subjective:    Patient ID: Lawrence Rinks., male    DOB: 05-05-1940, 83 y.o.   MRN: [id number] Chief Complaint  Patient presents with   Follow-up    Ultrasound    The patient returns today for evaluation of his left upper extremity AV fistula.  His dialysis center notes that he has been having some recent bleeding issues.  The patient notes that he has had some bleeding but it sporadic.  He notes that it may happen every other week wants.  He notes that he is not having any issues with actually running on dialysis.  He also notes that the bleeding tends to be more common with certain technicians versus others.  Today he has a low volume of 1287.  His previous flow volume was 1096.  No evidence of any significant stenosis noted.    Review of Systems  Hematological:  Bruises/bleeds easily.  All other systems reviewed and are negative.      Objective:   Physical Exam Vitals reviewed.  HENT:     Head: Normocephalic.  Cardiovascular:     Rate and Rhythm: Normal rate.     Pulses:          Radial pulses are 2+ on the right side and 2+ on the left side.     Arteriovenous access: Left arteriovenous access is present.    Comments: Good thrill and bruit Pulmonary:     Effort: Pulmonary effort is normal.  Skin:    General: Skin is warm and dry.  Neurological:     Mental Status: He is alert and oriented to person, place, and time.  Psychiatric:        Mood and Affect: Mood normal.        Thought Content: Thought content normal.        Judgment: Judgment normal.     BP 124/73 (BP Location: Right Arm, Cuff Size: Large)   Pulse 75   Resp 17   Ht 5\' 10"  (1.778 m)   Wt 180 lb (81.6 kg)   BMI 25.83 kg/m   Past Medical History:  Diagnosis Date   Anemia    Aortic atherosclerosis (HCC)    Arthritis    Atrial fibrillation and flutter (HCC)    a.) CHA2DS2-VASc = 5 (age x 2, CHF, HTN, aortic plaque). b.) rate/rhythm maintain on oral carvedilol; chronically anticoagulated with  dose reduced apixaban   Bilateral inguinal hernia    BPH (benign prostatic hyperplasia)    Cardiac syncope    Cardiomyopathy (HCC)    a.) TTE 04/23/2016: EF 40%. b.) TTE 09/03/2016: EF 50%. c.) TTE 05/04/2019: EF >55%.   CHF (congestive heart failure) (HCC)    a.) TTE 04/23/2016: EF 40%; BAE; mild AR/TR, mod MR; G1DD. b.) TTE 09/03/2016: EF 50%; mild LVH; BAE; triv AR/PR, mod MR/TR. c.) TTE 05/04/2019: EF >55%; mod LVH, mild LA enlargement; mild AR/MR/TR   CKD (chronic kidney disease), stage V (HCC)    Complication of anesthesia    a.) h/o intra/postoperative A.fib   Coronary artery disease    Diverticulosis    Gout    History of 2019 novel coronavirus disease (COVID-19) 11/14/2020   History of colon polyps    Hypercholesteremia    Hypertension    Long term current use of anticoagulant    a.) dose reduced apixaban; dose reduction d/t to age and CKD Dx.   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[facility])    a.) s/p resection   MGUS (monoclonal gammopathy  of unknown significance)    Pleural effusion    Rectal bleeding    Secondary hyperparathyroidism of renal origin [facility])     Social History   Socioeconomic History   Marital status: Married    Spouse name: Eber Jones   Number of children: Not on file   Years of education: Not on file   Highest education level: Not on file  Occupational History   Occupation: Retired  Tobacco Use   Smoking status: Former    Current packs/day: 0.00    Types: Cigarettes    Start date: 01/01/1966    Quit date: 01/01/1981    Years since quitting: 42.3    Passive exposure: Past   Smokeless tobacco: Never  Vaping Use   Vaping status: Never Used  Substance and Sexual Activity   Alcohol use: Not Currently   Drug use: No   Sexual activity: Yes    Birth control/protection: None  Other Topics Concern   Not on file  Social History Narrative   Regular exercise: Yes   Social Drivers of Health   Financial Resource Strain: Low Risk  (10/08/2022)   Received from [facility]   Overall Financial Resource Strain (CARDIA)    Difficulty of Paying Living Expenses: Not hard at all  Food Insecurity: No Food Insecurity (10/08/2022)   Received from [facility]   Hunger Vital Sign    Worried About Running Out of Food in the Last Year: Never true    Ran Out of Food in the Last Year: Never true  Transportation Needs: No Transportation Needs (10/08/2022)   [facility]    In the past 12 months, has lack of transportation kept you from medical appointments or from getting medications?: No    Lack of Transportation (Non-Medical): No  Physical Activity: Not on file  Stress: Not on file  Social Connections: Unknown (10/02/2021)   Received from [facility]   Social Network    Social Network: Not on file  Intimate Partner Violence: Not At Risk (02/02/2022)   Humiliation, Afraid, Rape, and Kick questionnaire    Fear of Current or Ex-Partner: No    Emotionally Abused: No    Physically Abused: No    Sexually Abused: No    Past Surgical History:  Procedure Laterality Date   A/V FISTULAGRAM Left 06/11/2022   Procedure: A/V Fistulagram;  Surgeon: Annice Needy, MD;  Location: [facility] INVASIVE CV LAB;  Service: Cardiovascular;  Laterality: Left;   A/V FISTULAGRAM Left 10/02/2022   Procedure: A/V Fistulagram;  Surgeon: Annice Needy, MD;  Location: [facility];  Service: Cardiovascular;  Laterality: Left;   ANKLE SURGERY Right 1985   following volleyball injury   APPENDECTOMY     at the of 9 yrs   AV FISTULA PLACEMENT Left 01/04/2022   Procedure: ARTERIOVENOUS (AV) FISTULA CREATION;  Surgeon: Annice Needy, MD;  Location: [facility] ORS;  Service: Vascular;  Laterality: Left;   CAPD INSERTION N/A 08/10/2021   Procedure: LAPAROSCOPIC INSERTION CONTINUOUS AMBULATORY PERITONEAL DIALYSIS  (CAPD) CATHETER;  Surgeon: Leafy Ro, MD;  Location: [facility] ORS;  Service: General;   Laterality: N/A;   CAPD REVISION N/A 09/21/2021   Procedure: LAPAROSCOPIC REVISION CONTINUOUS AMBULATORY PERITONEAL DIALYSIS  (CAPD) CATHETER;  Surgeon: Leafy Ro, MD;  Location: [facility] ORS;  Service: General;  Laterality: N/A;   CATARACT EXTRACTION Left    COLONOSCOPY  COLONOSCOPY WITH PROPOFOL N/A 01/02/2017   Procedure: COLONOSCOPY WITH PROPOFOL;  Surgeon: Scot Jun, MD;  Location: [facility] ENDOSCOPY;  Service: Endoscopy;  Laterality: N/A;   DIALYSIS/PERMA CATHETER INSERTION N/A 03/27/2021   Procedure: DIALYSIS/PERMA CATHETER INSERTION;  Surgeon: Annice Needy, MD;  Location: [facility];  Service: Cardiovascular;  Laterality: N/A;   DIALYSIS/PERMA CATHETER INSERTION N/A 10/10/2021   Procedure: DIALYSIS/PERMA CATHETER INSERTION;  Surgeon: Renford Dills, MD;  Location: [facility];  Service: Cardiovascular;  Laterality: N/A;   DIALYSIS/PERMA CATHETER REMOVAL N/A 05/08/2021   Procedure: DIALYSIS/PERMA CATHETER REMOVAL;  Surgeon: Annice Needy, MD;  Location: [facility];  Service: Cardiovascular;  Laterality: N/A;   DIALYSIS/PERMA CATHETER REMOVAL N/A 04/17/2022   Procedure: DIALYSIS/PERMA CATHETER REMOVAL;  Surgeon: Renford Dills, MD;  Location: [facility];  Service: Cardiovascular;  Laterality: N/A;   INSERTION OF MESH  08/10/2021   Procedure: INSERTION OF MESH;  Surgeon: Leafy Ro, MD;  Location: [facility] ORS;  Service: General;;   JOINT REPLACEMENT     RIGHT TOTAL HIP   KNEE ARTHROSCOPY WITH LATERAL RELEASE Right 04/20/2021   Procedure: Right knee medial retinaculum and lateral release with polyethylene exchange;  Surgeon: Kennedy Bucker, MD;  Location: [facility] ORS;  Service: Orthopedics;  Laterality: Right;   TONSILLECTOMY     as a child   TOTAL HIP ARTHROPLASTY  09/2009   Right femoral neck fracture   TOTAL HIP ARTHROPLASTY Left 03/06/2016   Procedure: TOTAL HIP ARTHROPLASTY ANTERIOR APPROACH;  Surgeon: Kennedy Bucker, MD;  Location:  [facility] ORS;  Service: Orthopedics;  Laterality: Left;   TOTAL KNEE ARTHROPLASTY Right 03/21/2021   Procedure: TOTAL KNEE ARTHROPLASTY;  Surgeon: Kennedy Bucker, MD;  Location: [facility] ORS;  Service: Orthopedics;  Laterality: Right;   UMBILICAL HERNIA REPAIR  08/10/2021   Procedure: HERNIA REPAIR UMBILICAL ADULT;  Surgeon: Leafy Ro, MD;  Location: [facility] ORS;  Service: General;;    Family History  Problem Relation Age of Onset   Cirrhosis Mother    Other Father        Lung Fibrosis   Prostate cancer Neg Hx    Kidney cancer Neg Hx    Bladder Cancer Neg Hx     No Known Allergies     Latest Ref Rng & Units 08/20/2022    9:53 AM 02/03/2022    9:29 AM 02/02/2022   10:06 AM  CBC  WBC 4.0 - 10.5 K/uL 5.9  6.1  6.3   Hemoglobin 13.0 - 17.0 g/dL 16.1  9.0  9.3   Hematocrit 39.0 - 52.0 % 31.3  25.8  26.9   Platelets 150 - 400 K/uL 188  132  128       CMP     Component Value Date/Time   NA 131 (L) 08/20/2022 0953   NA 139 02/04/2014 1449   K 4.4 08/20/2022 0953   K 4.6 02/04/2014 1449   CL 91 (L) 08/20/2022 0953   CL 105 02/04/2014 1449   CO2 30 08/20/2022 0953   CO2 27 02/04/2014 1449   GLUCOSE 99 08/20/2022 0953   GLUCOSE 84 02/04/2014 1449   BUN 52 (H) 08/20/2022 0953   BUN 23 (H) 02/04/2014 1449   CREATININE 6.06 (H) 08/20/2022 0953   CREATININE 1.25 02/04/2014 1449   CALCIUM 9.4 08/20/2022 0953   CALCIUM 8.7 02/04/2014 1449   PROT 7.4 01/31/2022 1723   ALBUMIN 3.8 01/31/2022 1723   AST 14 (L) 01/31/2022 1723  ALT 12 01/31/2022 1723   ALKPHOS 77 01/31/2022 1723   BILITOT 1.4 (H) 01/31/2022 1723   GFRNONAA 9 (L) 08/20/2022 0953   GFRNONAA >60 02/04/2014 1449     No results found.     Assessment & Plan:   1. ESRD (end stage renal disease) (HCC) (Primary) Recommend:  The patient is doing well and currently has adequate dialysis access.  Although there are some parameters suggesting possible future issues.  The patient notes that he has some bleeding but he notes  it is only sporadic and at this time would prefer careful observation versus intervention.   This raises concerns that the access is at moderate but not high risk for a problem or thrombosis and should be followed more closely  The patient will follow-up with me in the office in 3 months.  The need for a follow up duplex ultrasound will be made at that time based on whether problems with the access are persistent.    2. Hyperlipidemia, unspecified hyperlipidemia type Continue statin as ordered and reviewed, no changes at this time   Current Outpatient Medications on File Prior to Visit  Medication Sig Dispense Refill   apixaban (ELIQUIS) 2.5 MG TABS tablet Take 2.5 mg by mouth 2 (two) times daily.     apixaban (ELIQUIS) 2.5 MG TABS tablet Take 1 tablet (2.5 mg total) by mouth 2 (two) times daily. 180 tablet 3   B Complex Vitamins (B-COMPLEX/B-12 PO) Take 1,000 mcg by mouth.     calcitRIOL (ROCALTROL) 0.25 MCG capsule Take 0.25 mcg by mouth daily.     carvedilol (COREG) 3.125 MG tablet Take 6.25 mg by mouth daily.     cholecalciferol (VITAMIN D3) 25 MCG (1000 UNIT) tablet Take 1,000 Units by mouth daily.     cyanocobalamin (VITAMIN B12) 1000 MCG tablet Take 2,500 mcg by mouth daily.     Multiple Vitamin (MULTIVITAMIN WITH MINERALS) TABS tablet Take 1 tablet by mouth daily.     rosuvastatin (CRESTOR) 10 MG tablet Take 10 mg by mouth at bedtime.     rosuvastatin (CRESTOR) 5 MG tablet Take 1 tablet (5 mg total) by mouth daily. 90 tablet 3   Saccharomyces boulardii (PROBIOTIC) 250 MG CAPS Take 1 capsule by mouth in the morning and at bedtime. 12 capsule 0   sevelamer carbonate (RENVELA) 800 MG tablet Take 1,600 mg by mouth 3 (three) times daily.     sevelamer carbonate (RENVELA) 800 MG tablet Take 2 tablets (1,600 mg total) by mouth 3 (three) times daily with meals. 180 tablet 11   torsemide (DEMADEX) 20 MG tablet Take 2 tablets (40 mg total) by mouth daily. 60 tablet 0   traZODone (DESYREL) 50  MG tablet Take 50 mg by mouth at bedtime as needed for sleep.     No current facility-administered medications on file prior to visit.    There are no Patient Instructions on file for this visit. No follow-ups on file.   Caldwell Kronenberger E Taniesha Glanz, NP

## 2023-05-01 DIAGNOSIS — N186 End stage renal disease: Secondary | ICD-10-CM | POA: Diagnosis not present

## 2023-05-01 DIAGNOSIS — Z992 Dependence on renal dialysis: Secondary | ICD-10-CM | POA: Diagnosis not present

## 2023-05-02 DIAGNOSIS — D509 Iron deficiency anemia, unspecified: Secondary | ICD-10-CM | POA: Diagnosis not present

## 2023-05-02 DIAGNOSIS — D631 Anemia in chronic kidney disease: Secondary | ICD-10-CM | POA: Diagnosis not present

## 2023-05-02 DIAGNOSIS — N186 End stage renal disease: Secondary | ICD-10-CM | POA: Diagnosis not present

## 2023-05-02 DIAGNOSIS — Z992 Dependence on renal dialysis: Secondary | ICD-10-CM | POA: Diagnosis not present

## 2023-05-20 ENCOUNTER — Ambulatory Visit (INDEPENDENT_AMBULATORY_CARE_PROVIDER_SITE_OTHER): Payer: Medicare HMO | Admitting: Nurse Practitioner

## 2023-05-20 ENCOUNTER — Encounter (INDEPENDENT_AMBULATORY_CARE_PROVIDER_SITE_OTHER): Payer: Medicare HMO

## 2023-05-21 ENCOUNTER — Encounter (INDEPENDENT_AMBULATORY_CARE_PROVIDER_SITE_OTHER): Payer: Self-pay

## 2023-06-01 DIAGNOSIS — Z992 Dependence on renal dialysis: Secondary | ICD-10-CM | POA: Diagnosis not present

## 2023-06-01 DIAGNOSIS — N186 End stage renal disease: Secondary | ICD-10-CM | POA: Diagnosis not present

## 2023-06-04 DIAGNOSIS — Z992 Dependence on renal dialysis: Secondary | ICD-10-CM | POA: Diagnosis not present

## 2023-06-04 DIAGNOSIS — N186 End stage renal disease: Secondary | ICD-10-CM | POA: Diagnosis not present

## 2023-06-04 DIAGNOSIS — D631 Anemia in chronic kidney disease: Secondary | ICD-10-CM | POA: Diagnosis not present

## 2023-06-04 DIAGNOSIS — D509 Iron deficiency anemia, unspecified: Secondary | ICD-10-CM | POA: Diagnosis not present

## 2023-06-10 ENCOUNTER — Telehealth (INDEPENDENT_AMBULATORY_CARE_PROVIDER_SITE_OTHER): Payer: Self-pay

## 2023-06-10 NOTE — Telephone Encounter (Signed)
 Per call from pt states [facility] sent over a referral. Fistula may be narrowing? It has bleed after use- 3x. No referral at this time. Please advise

## 2023-06-11 ENCOUNTER — Telehealth (INDEPENDENT_AMBULATORY_CARE_PROVIDER_SITE_OTHER): Payer: Self-pay

## 2023-06-11 NOTE — Telephone Encounter (Signed)
 Spoke with the patient and he is scheduled with Dr. Vonna Guardian on 06/17/23 with a 1:00 pm arrival time to the [facility] for a left arm fistulagram. Pre-procedure instructions were discussed and will be sent to Mychart and mailed.

## 2023-06-17 ENCOUNTER — Encounter: Payer: Self-pay | Admitting: Vascular Surgery

## 2023-06-17 ENCOUNTER — Encounter
Admission: RE | Disposition: A | Discharge status: Home / Self Care | Payer: Self-pay | Source: Home / Self Care | Attending: Vascular Surgery

## 2023-06-17 ENCOUNTER — Other Ambulatory Visit: Payer: Self-pay

## 2023-06-17 ENCOUNTER — Ambulatory Visit
Admission: RE | Admit: 2023-06-17 | Discharge: 2023-06-17 | Disposition: A | Discharge status: Home / Self Care | Attending: Vascular Surgery | Admitting: Vascular Surgery

## 2023-06-17 DIAGNOSIS — I509 Heart failure, unspecified: Secondary | ICD-10-CM | POA: Diagnosis not present

## 2023-06-17 DIAGNOSIS — N186 End stage renal disease: Secondary | ICD-10-CM | POA: Diagnosis not present

## 2023-06-17 DIAGNOSIS — Z87891 Personal history of nicotine dependence: Secondary | ICD-10-CM | POA: Diagnosis not present

## 2023-06-17 DIAGNOSIS — I2510 Atherosclerotic heart disease of native coronary artery without angina pectoris: Secondary | ICD-10-CM | POA: Insufficient documentation

## 2023-06-17 DIAGNOSIS — T82858A Stenosis of vascular prosthetic devices, implants and grafts, initial encounter: Secondary | ICD-10-CM | POA: Diagnosis not present

## 2023-06-17 DIAGNOSIS — Y832 Surgical operation with anastomosis, bypass or graft as the cause of abnormal reaction of the patient, or of later complication, without mention of misadventure at the time of the procedure: Secondary | ICD-10-CM | POA: Diagnosis not present

## 2023-06-17 DIAGNOSIS — I132 Hypertensive heart and chronic kidney disease with heart failure and with stage 5 chronic kidney disease, or end stage renal disease: Secondary | ICD-10-CM | POA: Diagnosis not present

## 2023-06-17 DIAGNOSIS — Z79899 Other long term (current) drug therapy: Secondary | ICD-10-CM | POA: Diagnosis not present

## 2023-06-17 DIAGNOSIS — Z992 Dependence on renal dialysis: Secondary | ICD-10-CM

## 2023-06-17 HISTORY — PX: A/V FISTULAGRAM: CATH118298

## 2023-06-17 LAB — POTASSIUM (ARMC VASCULAR LAB ONLY): Potassium (ARMC vascular lab): 4.1 mmol/L (ref 3.5–5.1)

## 2023-06-17 SURGERY — A/V FISTULAGRAM
Anesthesia: Moderate Sedation | Laterality: Left

## 2023-06-17 MED ORDER — DIPHENHYDRAMINE HCL 50 MG/ML IJ SOLN: 50.0000 mg | Freq: Once | INTRAMUSCULAR | Status: DC | PRN

## 2023-06-17 MED ORDER — MIDAZOLAM HCL 5 MG/5ML IJ SOLN
INTRAMUSCULAR | Status: AC
  Filled 2023-06-17: qty 5

## 2023-06-17 MED ORDER — MIDAZOLAM HCL 2 MG/2ML IJ SOLN
INTRAMUSCULAR | Status: DC | PRN
  Administered 2023-06-17: 1 mg via INTRAVENOUS

## 2023-06-17 MED ORDER — ONDANSETRON HCL 4 MG/2ML IJ SOLN: 4.0000 mg | Freq: Four times a day (QID) | INTRAMUSCULAR | Status: DC | PRN

## 2023-06-17 MED ORDER — HYDROMORPHONE HCL 1 MG/ML IJ SOLN: 1.0000 mg | Freq: Once | INTRAMUSCULAR | Status: DC | PRN

## 2023-06-17 MED ORDER — LIDOCAINE-EPINEPHRINE (PF) 1 %-1:200000 IJ SOLN
INTRAMUSCULAR | Status: DC | PRN
  Administered 2023-06-17: 5 mL via INTRADERMAL

## 2023-06-17 MED ORDER — METHYLPREDNISOLONE SODIUM SUCC 125 MG IJ SOLR: 125.0000 mg | Freq: Once | INTRAMUSCULAR | Status: DC | PRN

## 2023-06-17 MED ORDER — CEFAZOLIN SODIUM-DEXTROSE 1-4 GM/50ML-% IV SOLN
INTRAVENOUS | Status: AC
  Filled 2023-06-17: qty 50

## 2023-06-17 MED ORDER — FENTANYL CITRATE (PF) 100 MCG/2ML IJ SOLN
INTRAMUSCULAR | Status: DC | PRN
Start: 2023-06-17 — End: 2023-06-17
  Administered 2023-06-17: 50 ug via INTRAVENOUS

## 2023-06-17 MED ORDER — CEFAZOLIN SODIUM-DEXTROSE 1-4 GM/50ML-% IV SOLN
1.0000 g | INTRAVENOUS | Status: AC
  Administered 2023-06-17: 1 g via INTRAVENOUS

## 2023-06-17 MED ORDER — FAMOTIDINE 20 MG PO TABS: 40.0000 mg | ORAL_TABLET | Freq: Once | ORAL | Status: DC | PRN

## 2023-06-17 MED ORDER — HEPARIN SODIUM (PORCINE) 1000 UNIT/ML IJ SOLN
INTRAMUSCULAR | Status: DC | PRN
Start: 2023-06-17 — End: 2023-06-17
  Administered 2023-06-17: 3000 [IU] via INTRAVENOUS

## 2023-06-17 MED ORDER — MIDAZOLAM HCL 2 MG/ML PO SYRP: 8.0000 mg | ORAL_SOLUTION | Freq: Once | ORAL | Status: DC | PRN

## 2023-06-17 MED ORDER — HEPARIN (PORCINE) IN NACL 1000-0.9 UT/500ML-% IV SOLN
INTRAVENOUS | Status: DC | PRN
  Administered 2023-06-17: 500 mL

## 2023-06-17 MED ORDER — SODIUM CHLORIDE 0.9 % IV SOLN: INTRAVENOUS | Status: DC

## 2023-06-17 MED ORDER — FENTANYL CITRATE (PF) 100 MCG/2ML IJ SOLN
INTRAMUSCULAR | Status: AC
  Filled 2023-06-17: qty 2

## 2023-06-17 MED ORDER — HEPARIN SODIUM (PORCINE) 1000 UNIT/ML IJ SOLN
INTRAMUSCULAR | Status: AC
  Filled 2023-06-17: qty 10

## 2023-06-17 MED ORDER — IODIXANOL 320 MG/ML IV SOLN
INTRAVENOUS | Status: DC | PRN
  Administered 2023-06-17: 25 mL via INTRAVENOUS

## 2023-06-17 SURGICAL SUPPLY — 10 items
BALLOON LUTONIX DCB 7X60X130 (BALLOONS) IMPLANT
BALLOON ULTRVRSE 8X60X75C (BALLOONS) IMPLANT
COVER PROBE ULTRASOUND 5X96 (MISCELLANEOUS) IMPLANT
DEVICE PRESTO INFLATION (MISCELLANEOUS) IMPLANT
DRAPE BRACHIAL (DRAPES) IMPLANT
KIT MICROPUNCTURE VSI 5F STIFF (SHEATH) IMPLANT
PACK ANGIOGRAPHY (CUSTOM PROCEDURE TRAY) ×1 IMPLANT
SHEATH BRITE TIP 6FRX5.5 (SHEATH) IMPLANT
SUT MNCRL AB 4-0 PS2 18 (SUTURE) IMPLANT
WIRE SUPRACORE 190CM (WIRE) IMPLANT

## 2023-06-17 NOTE — Op Note (Signed)
 [facility]    OPERATIVE NOTE   PROCEDURE: 1.   Left brachiocephalic arteriovenous fistula cannulation under ultrasound guidance 2.   Left arm fistulagram including central venogram 3.   Percutaneous transluminal angioplasty of mid upper arm cephalic vein stenosis with 7 mm diameter Lutonix drug-coated and 8 mm diameter conventional angioplasty balloons  PRE-OPERATIVE DIAGNOSIS: 1. ESRD 2. 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  The perianastomotic cephalic vein was patent.  There was mild stenosis at the cephalic vein subclavian vein confluence and into the subclavian vein but this did not appear flow-limiting.  The innominate vein and superior vena cava were widely patent.  SPECIMEN(S):  None  CONTRAST: 25 cc  FLUORO TIME: 2.3 minutes  MODERATE CONSCIOUS SEDATION TIME: Approximately 28 minutes with 1 mg of Versed  and 50 mcg of Fentanyl    INDICATIONS: Lawrence Perkins. is a 83 y.o. male who presents with malfunctioning left brachiocephalic arteriovenous fistula.  The patient is scheduled for left arm fistulagram.  The patient is aware the risks include but are not limited to: bleeding, infection, thrombosis of the cannulated access, and possible anaphylactic reaction to the contrast.  The patient is aware of the risks of the procedure and elects to proceed forward.  DESCRIPTION: After full informed written consent was obtained, the patient was brought back to the angiography suite and placed supine upon the angiography table.  The patient was  connected to monitoring equipment. Moderate conscious sedation was administered with a face to face encounter with the patient throughout the procedure with my supervision of the RN administering medicines and monitoring the patient's vital signs and mental status throughout from the start of the procedure until the patient was taken to the recovery room. The left arm was prepped and draped in the standard fashion for a percutaneous access intervention.  Under ultrasound guidance, the left brachiocephalic arteriovenous fistula was cannulated with a micropuncture needle under direct ultrasound guidance where it was patent and a permanent image was performed.  The microwire was advanced into the fistula and the needle was exchanged for the a microsheath.  I then upsized to a 6 Fr Sheath and imaging was performed.  Hand injections were completed to image the access including the central venous system. This demonstrated hyperplastic stenosis at the leading edge of the previously placed stent in the mid upper arm cephalic vein creating 60 to 70% stenosis with an area of hyperplasia just distal to the previously placed stent in the mid upper arm cephalic vein creating an 85 to 90% stenosis.  The perianastomotic cephalic vein was patent.  There was mild stenosis at the cephalic vein subclavian vein confluence and into the subclavian vein but this did not appear flow-limiting.  The innominate vein and superior vena cava were widely patent..  Based on the images, this patient will need intervention to this mid upper arm cephalic vein stenosis. I then gave the patient 3000 units of intravenous heparin .  I then crossed the stenosis with a Supracore wire.  Based on the imaging, a 7 mm x 6 cm  Lutonix drug coated angioplasty balloon was selected.  The balloon was centered around the mid upper arm cephalic vein stenosis and inflated to 12 ATM for 1 minute(s).  This was slightly undersized, so I exchanged for an 8 mm diameter by  6 cm length angioplasty balloon and inflated in the same area to 8 atm for 1 minute.  On completion imaging, a 15-20% residual stenosis was present.     Based on the completion imaging, no further intervention is necessary.  The wire and balloon were removed from the sheath.  A 4-0 Monocryl purse-string suture was sewn around the sheath.  The sheath was removed while tying down the suture.  A sterile bandage was applied to the puncture site.  COMPLICATIONS: None  CONDITION: Stable   Mikki Alexander  06/17/2023 2:50 PM   This note was created with Dragon Medical transcription system. Any errors in dictation are purely unintentional.

## 2023-06-17 NOTE — H&P (Signed)
 [facility] Admission History & Physical  MRN : [id number]  Lawrence Perkins. is a 83 y.o. (Sep 10, 1940) male who presents with chief complaint of No chief complaint on file. Lawrence Perkins  History of Present Illness: I am asked to evaluate the patient by the dialysis center. The patient was sent here because they were unable to achieve adequate dialysis this morning. Furthermore the Center states there is very poor thrill and bruit. The patient states there there have been increasing problems with the access, such as pulling clots during dialysis and prolonged bleeding after decannulation. The patient estimates these problems have been going on for several weeks. The patient is unaware of any other change.   Patient denies pain or tenderness overlying the access.  There is no pain with dialysis.  The patient denies hand pain or finger pain consistent with steal syndrome.    There have been past interventions or declots of this access.  The patient is not chronically hypotensive on dialysis.  Current Facility-Administered Medications  Medication Dose Route Frequency Provider Last Rate Last Admin   0.9 %  sodium chloride  infusion   Intravenous Continuous Brown, Fallon E, NP       ceFAZolin  (ANCEF ) IVPB 1 g/50 mL premix  1 g Intravenous 30 min Pre-Op  Brown, Fallon E, NP       diphenhydrAMINE  (BENADRYL ) injection 50 mg  50 mg Intravenous Once PRN Brown, Fallon E, NP       famotidine  (PEPCID ) tablet 40 mg  40 mg Oral Once PRN Brown, Fallon E, NP       methylPREDNISolone  sodium succinate (SOLU-MEDROL ) 125 mg/2 mL injection 125 mg  125 mg Intravenous Once PRN Brown, Fallon E, NP       midazolam  (VERSED ) 2 MG/ML syrup 8 mg  8 mg Oral Once PRN Brown, Fallon E, NP        Past Medical History:  Diagnosis Date   Anemia    Aortic atherosclerosis (HCC)    Arthritis    Atrial fibrillation and flutter (HCC)    a.) CHA2DS2-VASc = 5 (age x 2, CHF, HTN, aortic plaque). b.) rate/rhythm  maintain on oral carvedilol ; chronically anticoagulated with dose reduced apixaban    Bilateral inguinal hernia    BPH (benign prostatic hyperplasia)    Cardiac syncope    Cardiomyopathy (HCC)    a.) TTE 04/23/2016: EF 40%. b.) TTE 09/03/2016: EF 50%. c.) TTE 05/04/2019: EF >55%.   CHF (congestive heart failure) (HCC)    a.) TTE 04/23/2016: EF 40%; BAE; mild AR/TR, mod MR; G1DD. b.) TTE 09/03/2016: EF 50%; mild LVH; BAE; triv AR/PR, mod MR/TR. c.) TTE 05/04/2019: EF >55%; mod LVH, mild LA enlargement; mild AR/MR/TR   CKD (chronic kidney disease), stage V (HCC)    Complication of anesthesia    a.) h/o intra/postoperative A.fib   Coronary artery disease    Diverticulosis    Gout    History of 2019 novel coronavirus disease (COVID-19) 11/14/2020   History of colon polyps    Hypercholesteremia    Hypertension    Long term current use of anticoagulant    a.) dose reduced apixaban ; dose reduction d/t to age and CKD Dx.   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)     Past Surgical History:  Procedure Laterality Date  A/V FISTULAGRAM Left 06/11/2022   Procedure: A/V Fistulagram;  Surgeon: Celso College, MD;  Location: [facility] INVASIVE CV LAB;  Service: Cardiovascular;  Laterality: Left;   A/V FISTULAGRAM Left 10/02/2022   Procedure: A/V Fistulagram;  Surgeon: Celso College, MD;  Location: [facility] INV[facility] CV [facility];  Service: Cardiovascular;  Laterality: Left;   ANKLE SURGERY Right 1985   following volleyball injury   APPENDECTOMY     at the of 9 yrs   AV FISTULA PLACEMENT Left 01/04/2022   Procedure: ARTERIOVENOUS (AV) FISTULA CREATION;  Surgeon: Celso College, MD;  Location: [facility] ORS;  Service: Vascular;  Laterality: Left;   CAPD INSERTION N/A 08/10/2021   Procedure: LAPAROSCOPIC INSERTION CONTINUOUS AMBULATORY PERITONEAL DIALYSIS  (CAPD) CATHETER;   Surgeon: Alben Alma, MD;  Location: [facility] ORS;  Service: General;  Laterality: N/A;   CAPD REVISION N/A 09/21/2021   Procedure: LAPAROSCOPIC REVISION CONTINUOUS AMBULATORY PERITONEAL DIALYSIS  (CAPD) CATHETER;  Surgeon: Alben Alma, MD;  Location: [facility] OR[facility];  Service: General;  Laterality: N/A;   CATARACT EXTRACTION Left    COLONOSCOPY     COLONOSCOPY WITH PROPOFOL  N/A 01/02/2017   Procedure: COLONOSCOPY WITH PROPOFOL ;  Surgeon: Cassie Click, MD;  Location: [facility] ENDOSCOPY;  Service: Endoscopy;  Laterality: N/A;   DIALYSIS/PERMA CATHETER INSERTION N/A 03/27/2021   Procedure: DIALYSIS/PERMA CATHETER INSERTION;  Surgeon: Celso College, MD;  Location: [facility];  Service: Cardiovascular;  Laterality: N/A;   DIALYSIS/PERMA CATHETER INSERTION N/A 10/10/2021   Procedure: DIALYSIS/PERMA CATHETER INSERTION;  Surgeon: Jackquelyn Mass, MD;  Location: [facility];  Service: Cardiovascular;  Laterality: N/A;   DIALYSIS/PERMA CATHETER REMOVAL N/A 05/08/2021   Procedure: DIALYSIS/PERMA CATHETER REMOVAL;  Surgeon: Celso College, MD;  Location: [facility];  Service: Cardiovascular;  Laterality: N/A;   DIALYSIS/PERMA CATHETER REMOVAL N/A 04/17/2022   Procedure: DIALYSIS/PERMA CATHETER REMOVAL;  Surgeon: Jackquelyn Mass, MD;  Location: [facility];  Service: Cardiovascular;  Laterality: N/A;   INSERTION OF MESH  08/10/2021   Procedure: INSERTION OF MESH;  Surgeon: Alben Alma, MD;  Location: [facility] ORS;  Service: General;;   JOINT REPLACEMENT     RIGHT TOTAL HIP   KNEE ARTHROSCOPY WITH LATERAL RELEASE Right 04/20/2021   Procedure: Right knee medial retinaculum and lateral release with polyethylene exchange;  Surgeon: Molli Angelucci, MD;  Location: [facility] ORS;  Service: Orthopedics;  Laterality: Right;   TONSILLECTOMY     as a child   TOTAL HIP ARTHROPLASTY  09/2009   Right femoral neck fracture   TOTAL HIP ARTHROPLASTY Left 03/06/2016   Procedure: TOTAL HIP  ARTHROPLASTY ANTERIOR APPROACH;  Surgeon: Molli Angelucci, MD;  Location: [facility] OR[facility];  Service: Orthopedics;  Laterality: Left;   TOTAL KNEE ARTHROPLASTY Right 03/21/2021   Procedure: TOTAL KNEE ARTHROPLASTY;  Surgeon: Molli Angelucci, MD;  Location: [facility] OR[facility];  Service: Orthopedics;  Laterality: Right;   UMBILICAL HERNIA REPAIR  08/10/2021   Procedure: HERNIA REPAIR UMBILICAL ADULT;  Surgeon: Alben Alma, MD;  Location: [facility] OR[facility];  Service: General;;     Social History   Tobacco Use   Smoking status: Former    Current packs/day: 0.00    Types: Cigarettes    Start date: 01/01/1966    Quit date: 01/01/1981    Years since quitting: 42.4    Passive exposure: Past   Smokeless tobacco: Never  Vaping Use   Vaping status: Never Used  Substance Use Topics   Alcohol use: Not Currently  Drug use: No     Family History  Problem Relation Age of Onset   Cirrhosis Mother    Other Father        Lung Fibrosis   Prostate cancer Neg Hx    Kidney cancer Neg Hx    Bladder Cancer Neg Hx     No family history of bleeding or clotting disorders, autoimmune disease or porphyria  No Known Allergies   REVIEW OF SYSTEMS (Negative unless checked)  Constitutional: [] Weight loss  [] Fever  [] Chills Cardiac: [] Chest pain   [] Chest pressure   [x] Palpitations   [] Shortness of breath when laying flat   [] Shortness of breath at rest   [x] Shortness of breath with exertion. Vascular:  [] Pain in legs with walking   [] Pain in legs at rest   [] Pain in legs when laying flat   [] Claudication   [] Pain in feet when walking  [] Pain in feet at rest  [] Pain in feet when laying flat   [] History of DVT   [] Phlebitis   [] Swelling in legs   [] Varicose veins   [] Non-healing ulcers Pulmonary:   [] Uses home oxygen   [] Productive cough   [] Hemoptysis   [] Wheeze  [] COPD   [] Asthma Neurologic:  [] Dizziness  [] Blackouts   [] Seizures   [] History of stroke   [] History of TIA  [] Aphasia   [] Temporary blindness   [] Dysphagia   [] Weakness or  numbness in arms   [] Weakness or numbness in legs Musculoskeletal:  [] Arthritis   [] Joint swelling   [] Joint pain   [] Low back pain Hematologic:  [] Easy bruising  [] Easy bleeding   [] Hypercoagulable state   [x] Anemic  [] Hepatitis Gastrointestinal:  [] Blood in stool   [] Vomiting blood  [] Gastroesophageal reflux/heartburn   [] Difficulty swallowing. Genitourinary:  [x] Chronic kidney disease   [] Difficult urination  [] Frequent urination  [] Burning with urination   [] Blood in urine Skin:  [] Rashes   [] Ulcers   [] Wounds Psychological:  [] History of anxiety   []  History of major depression.  Physical Examination  Vitals:   06/17/23 1336  BP: (!) 141/67  Pulse: (!) 56  Resp: 20  Temp: (!) 97.5 F (36.4 C)  TempSrc: Oral  SpO2: 99%  Weight: 82.6 kg  Height: 5' 10 (1.778 m)   Body mass index is 26.11 kg/m. Gen: WD/WN, NAD Head: [place]/AT, No temporalis wasting.  Ear/Nose/Throat: Hearing grossly intact, nares w/o erythema or drainage, oropharynx w/o Erythema/Exudate,  Eyes: Conjunctiva clear, sclera non-icteric Neck: Trachea midline.  No JVD.  Pulmonary:  Good air movement, respirations not labored, no use of accessory muscles.  Cardiac: RRR, normal S1, S2. Vascular: left brachiocpehalic AVF is somewhat pulsatile.  Vessel Right Left  Radial Palpable Palpable   Musculoskeletal: M/S 5/5 throughout.  Extremities without ischemic changes.  No deformity or atrophy.  Neurologic: Sensation grossly intact in extremities.  Symmetrical.  Speech is fluent. Motor exam as listed above. Psychiatric: Judgment intact, Mood & affect appropriate for pt's clinical situation. Dermatologic: No rashes or ulcers noted.  No cellulitis or open wounds.    CBC Lab Results  Component Value Date   WBC 5.9 08/20/2022   HGB 10.6 (L) 08/20/2022   HCT 31.3 (L) 08/20/2022   MCV 103.3 (H) 08/20/2022   PLT 188 08/20/2022    BMET    Component Value Date/Time   NA 131 (L) 08/20/2022 0953   NA 139 02/04/2014 1449    K 4.4 08/20/2022 0953   K 4.6 02/04/2014 1449   CL 91 (L) 08/20/2022 0953   CL  105 02/04/2014 1449   CO2 30 08/20/2022 0953   CO2 27 02/04/2014 1449   GLUCOSE 99 08/20/2022 0953   GLUCOSE 84 02/04/2014 1449   BUN 52 (H) 08/20/2022 0953   BUN 23 (H) 02/04/2014 1449   CREATININE 6.06 (H) 08/20/2022 0953   CREATININE 1.25 02/04/2014 1449   CALCIUM  9.4 08/20/2022 0953   CALCIUM  8.7 02/04/2014 1449   GFRNONAA 9 (L) 08/20/2022 0953   GFRNONAA >60 02/04/2014 1449   GFRAA 22 (L) 09/15/2019 0856   GFRAA >60 02/04/2014 1449   CrCl cannot be calculated (Patient's most recent lab result is older than the maximum 21 days allowed.).  COAG Lab Results  Component Value Date   INR 1.7 (H) 02/02/2022   INR 1.9 (H) 01/31/2022   INR 1.4 (H) 03/09/2021    Radiology No results found.  Assessment/Plan 1.  Complication dialysis device with dysfunction of AV access:  Patient's dialysis access is malfunctioning. The patient will undergo angiography and correction of any problems using interventional techniques with the hope of restoring function to the access.  The risks and benefits were described to the patient.  All questions were answered.  The patient agrees to proceed with angiography and intervention. Potassium will be drawn to ensure that it is an appropriate level prior to performing intervention. 2.  End-stage renal disease requiring hemodialysis:  Patient will continue dialysis therapy without further interruption if a successful intervention is not achieved then a tunneled catheter will be placed. Dialysis has already been arranged. 3.  Hypertension:  Patient will continue medical management; nephrology is following no changes in oral medications. 4.  Coronary artery disease:  EKG will be monitored. Nitrates will be used if needed. The patient's oral cardiac medications will be continued.    Lawrence Alexander, MD  06/17/2023 1:42 PM

## 2023-06-18 ENCOUNTER — Encounter: Payer: Self-pay | Admitting: Vascular Surgery

## 2023-07-01 DIAGNOSIS — Z992 Dependence on renal dialysis: Secondary | ICD-10-CM | POA: Diagnosis not present

## 2023-07-01 DIAGNOSIS — N186 End stage renal disease: Secondary | ICD-10-CM | POA: Diagnosis not present

## 2023-07-02 DIAGNOSIS — D509 Iron deficiency anemia, unspecified: Secondary | ICD-10-CM | POA: Diagnosis not present

## 2023-07-02 DIAGNOSIS — D631 Anemia in chronic kidney disease: Secondary | ICD-10-CM | POA: Diagnosis not present

## 2023-07-02 DIAGNOSIS — N186 End stage renal disease: Secondary | ICD-10-CM | POA: Diagnosis not present

## 2023-07-02 DIAGNOSIS — Z992 Dependence on renal dialysis: Secondary | ICD-10-CM | POA: Diagnosis not present

## 2023-07-08 DIAGNOSIS — H1045 Other chronic allergic conjunctivitis: Secondary | ICD-10-CM | POA: Diagnosis not present

## 2023-07-08 DIAGNOSIS — D3131 Benign neoplasm of right choroid: Secondary | ICD-10-CM | POA: Diagnosis not present

## 2023-07-08 DIAGNOSIS — H35039 Hypertensive retinopathy, unspecified eye: Secondary | ICD-10-CM | POA: Diagnosis not present

## 2023-07-08 DIAGNOSIS — H353131 Nonexudative age-related macular degeneration, bilateral, early dry stage: Secondary | ICD-10-CM | POA: Diagnosis not present

## 2023-07-12 ENCOUNTER — Other Ambulatory Visit (INDEPENDENT_AMBULATORY_CARE_PROVIDER_SITE_OTHER): Payer: Self-pay | Admitting: Vascular Surgery

## 2023-07-12 DIAGNOSIS — N186 End stage renal disease: Secondary | ICD-10-CM

## 2023-07-15 ENCOUNTER — Other Ambulatory Visit (INDEPENDENT_AMBULATORY_CARE_PROVIDER_SITE_OTHER)

## 2023-07-15 ENCOUNTER — Encounter (INDEPENDENT_AMBULATORY_CARE_PROVIDER_SITE_OTHER): Payer: Self-pay | Admitting: Nurse Practitioner

## 2023-07-15 ENCOUNTER — Ambulatory Visit (INDEPENDENT_AMBULATORY_CARE_PROVIDER_SITE_OTHER): Admitting: Nurse Practitioner

## 2023-07-15 VITALS — BP 131/65 | HR 64 | Ht 70.0 in | Wt 182.0 lb

## 2023-07-15 DIAGNOSIS — N186 End stage renal disease: Secondary | ICD-10-CM

## 2023-07-15 DIAGNOSIS — E785 Hyperlipidemia, unspecified: Secondary | ICD-10-CM | POA: Diagnosis not present

## 2023-07-15 NOTE — Progress Notes (Signed)
 Subjective:    Patient ID: Lawrence Perkins., male    DOB: [birth year], 83 y.o.   MRN: [id number] Chief Complaint  Patient presents with   fu 3 months + HDA    The patient returns to the office for followup of their dialysis access.   The patient reports the function of the access has been stable. Patient denies difficulty with cannulation. The patient denies increased bleeding time after removing the needles. The patient denies hand pain or other symptoms consistent with steal phenomena.  No significant arm swelling.  The patient notes a 1 of incident of bleeding following dialysis in the parking lot but has not had any significant issues other than that.  The patient denies any complaints from the dialysis center or their nephrologist.  The patient denies redness or swelling at the access site. The patient denies fever or chills at home or while on dialysis.  No recent shortening of the patient's walking distance or new symptoms consistent with claudication.  No history of rest pain symptoms. No new ulcers or wounds of the lower extremities have occurred.  The patient denies amaurosis fugax or recent TIA symptoms. There are no recent neurological changes noted. There is no history of DVT, PE or superficial thrombophlebitis. No recent episodes of angina or shortness of breath documented.   Duplex ultrasound of the AV access shows a patent access.  The previously noted stenosis is not significantly changed compared to last study.  Flow volume today is 1742 cc/min (previous flow volume was 1287 cc/min)      Review of Systems  All other systems reviewed and are negative.      Objective:   Physical Exam Vitals reviewed.  HENT:     Head: Normocephalic.  Cardiovascular:     Rate and Rhythm: Normal rate.     Pulses: Normal pulses.     Arteriovenous access: Left arteriovenous access is present.     Comments: Good thrill and bruit Pulmonary:     Effort: Pulmonary effort is  normal.  Skin:    General: Skin is warm and dry.  Neurological:     Mental Status: He is alert and oriented to person, place, and time.  Psychiatric:        Mood and Affect: Mood normal.        Behavior: Behavior normal.        Thought Content: Thought content normal.        Judgment: Judgment normal.     BP 131/65   Pulse 64   Ht 5' 10 (1.778 m)   Wt 182 lb (82.6 kg)   BMI 26.11 kg/m   Past Medical History:  Diagnosis Date   Anemia    Aortic atherosclerosis (HCC)    Arthritis    Atrial fibrillation and flutter (HCC)    a.) CHA2DS2-VASc = 5 (age x 2, CHF, HTN, aortic plaque). b.) rate/rhythm maintain on oral carvedilol ; chronically anticoagulated with dose reduced apixaban    Bilateral inguinal hernia    BPH (benign prostatic hyperplasia)    Cardiac syncope    Cardiomyopathy (HCC)    a.) TTE 04/23/2016: EF 40%. b.) TTE 09/03/2016: EF 50%. c.) TTE 05/04/2019: EF >55%.   CHF (congestive heart failure) (HCC)    a.) TTE 04/23/2016: EF 40%; BAE; mild AR/TR, mod MR; G1DD. b.) TTE 09/03/2016: EF 50%; mild LVH; BAE; triv AR/PR, mod MR/TR. c.) TTE 05/04/2019: EF >55%; mod LVH, mild LA enlargement; mild AR/MR/TR   CKD (chronic kidney  disease), stage V (HCC)    Complication of anesthesia    a.) h/o intra/postoperative A.fib   Coronary artery disease    Diverticulosis    Gout    History of 2019 novel coronavirus disease (COVID-19) 11/14/2020   History of colon polyps    Hypercholesteremia    Hypertension    Long term current use of anticoagulant    a.) dose reduced apixaban ; dose reduction d/t to age and CKD Dx.   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HCC)    a.) s/p resection   MGUS (monoclonal gammopathy of unknown significance)    Pleural effusion    Rectal bleeding    Secondary hyperparathyroidism of renal origin (HCC)     Social History   Socioeconomic History   Marital status: Married    Spouse name: Elveria   Number of children: Not on file   Years of education: Not on file    Highest education level: Not on file  Occupational History   Occupation: Retired  Tobacco Use   Smoking status: Former    Current packs/day: 0.00    Types: Cigarettes    Start date: 01/01/1966    Quit date: 01/01/1981    Years since quitting: 42.5    Passive exposure: Past   Smokeless tobacco: Never  Vaping Use   Vaping status: Never Used  Substance and Sexual Activity   Alcohol use: Not Currently   Drug use: No   Sexual activity: Yes    Birth control/protection: None  Other Topics Concern   Not on file  Social History Narrative   Regular exercise: Yes   Social Drivers of Health   Financial Resource Strain: Low Risk  (10/08/2022)   [facility]   Overall Financial Resource Strain (CARDIA)    Difficulty of Paying Living Expenses: Not hard at all  Food Insecurity: No Food Insecurity (10/08/2022)   Received from [facility]   Hunger Vital Sign    Within the past 12 months, you worried that your food would run out before you got the money to buy more.: Never true    Within the past 12 months, the food you bought just didn't last and you didn't have money to get more.: Never true  Transportation Needs: No Transportation Needs (10/08/2022)   Received from [facility] - Transportation    In the past 12 months, has lack of transportation kept you from medical appointments or from getting medications?: No    Lack of Transportation (Non-Medical): No  Physical Activity: Not on file  Stress: Not on file  Social Connections: Unknown (10/02/2021)   Received from [facility]   Social Network    Social Network: Not on file  Intimate Partner Violence: Not At Risk (02/02/2022)   Humiliation, Afraid, Rape, and Kick questionnaire    Fear of Current or Ex-Partner: No    Emotionally Abused: No    Physically Abused: No    Sexually Abused: No    Past Surgical History:  Procedure Laterality Date   A/V FISTULAGRAM Left  06/11/2022   Procedure: A/V Fistulagram;  Surgeon: Marea Selinda RAMAN, MD;  Location: [facility];  Service: Cardiovascular;  Laterality: Left;   A/V FISTULAGRAM Left 10/02/2022   Procedure: A/V Fistulagram;  Surgeon: Marea Selinda RAMAN, MD;  Location: [facility];  Service: Cardiovascular;  Laterality: Left;   A/V FISTULAGRAM Left 06/17/2023   Procedure: A/V Fistulagram;  Surgeon: Marea Selinda RAMAN, MD;  Location: [facility];  Service: Cardiovascular;  Laterality: Left;   ANKLE SURGERY Right 1985   following volleyball injury   APPENDECTOMY     at the of 9 yrs   AV FISTULA PLACEMENT Left 01/04/2022   Procedure: ARTERIOVENOUS (AV) FISTULA CREATION;  Surgeon: Marea Selinda RAMAN, MD;  Location: [facility];  Service: Vascular;  Laterality: Left;   CAPD INSERTION N/A 08/10/2021   Procedure: LAPAROSCOPIC INSERTION CONTINUOUS AMBULATORY PERITONEAL DIALYSIS  (CAPD) CATHETER;  Surgeon: Jordis Laneta FALCON, MD;  Location: [facility];  Service: General;  Laterality: N/A;   CAPD REVISION N/A 09/21/2021   Procedure: LAPAROSCOPIC REVISION CONTINUOUS AMBULATORY PERITONEAL DIALYSIS  (CAPD) CATHETER;  Surgeon: Jordis Laneta FALCON, MD;  Location: [facility];  Service: General;  Laterality: N/A;   CATARACT EXTRACTION Left    COLONOSCOPY     COLONOSCOPY WITH PROPOFOL  N/A 01/02/2017   Procedure: COLONOSCOPY WITH PROPOFOL ;  Surgeon: Viktoria Lamar DASEN, MD;  Location: [facility]OSCOPY;  Service: Endoscopy;  Laterality: N/A;   DIALYSIS/PERMA CATHETER INSERTION N/A 03/27/2021   Procedure: DIALYSIS/PERMA CATHETER INSERTION;  Surgeon: Marea Selinda RAMAN, MD;  Location: [facility];  Service: Cardiovascular;  Laterality: N/A;   DIALYSIS/PERMA CATHETER INSERTION N/A 10/10/2021   Procedure: DIALYSIS/PERMA CATHETER INSERTION;  Surgeon: Jama Cordella MATSU, MD;  Location: [facility];  Service: Cardiovascular;  Laterality: N/A;   DIALYSIS/PERMA CATHETER REMOVAL N/A 05/08/2021   Procedure: DIALYSIS/PERMA CATHETER REMOVAL;   Surgeon: Marea Selinda RAMAN, MD;  Location: [facility];  Service: Cardiovascular;  Laterality: N/A;   DIALYSIS/PERMA CATHETER REMOVAL N/A 04/17/2022   Procedure: DIALYSIS/PERMA CATHETER REMOVAL;  Surgeon: Jama Cordella MATSU, MD;  Location: [facility];  Service: Cardiovascular;  Laterality: N/A;   INSERTION OF MESH  08/10/2021   Procedure: INSERTION OF MESH;  Surgeon: Jordis Laneta FALCON, MD;  Location: [facility];  Service: General;;   JOINT REPLACEMENT     RIGHT TOTAL HIP   KNEE ARTHROSCOPY WITH LATERAL RELEASE Right 04/20/2021   Procedure: Right knee medial retinaculum and lateral release with polyethylene exchange;  Surgeon: Kathlynn Sharper, MD;  Location: [facility];  Service: Orthopedics;  Laterality: Right;   TONSILLECTOMY     as a child   TOTAL HIP ARTHROPLASTY  09/2009   Right femoral neck fracture   TOTAL HIP ARTHROPLASTY Left 03/06/2016   Procedure: TOTAL HIP ARTHROPLASTY ANTERIOR APPROACH;  Surgeon: Sharper Kathlynn, MD;  Location: [facility];  Service: Orthopedics;  Laterality: Left;   TOTAL KNEE ARTHROPLASTY Right 03/21/2021   Procedure: TOTAL KNEE ARTHROPLASTY;  Surgeon: Kathlynn Sharper, MD;  Location: [facility];  Service: Orthopedics;  Laterality: Right;   UMBILICAL HERNIA REPAIR  08/10/2021   Procedure: HERNIA REPAIR UMBILICAL ADULT;  Surgeon: Jordis Laneta FALCON, MD;  Location: [facility];  Service: General;;    Family History  Problem Relation Age of Onset   Cirrhosis Mother    Other Father        Lung Fibrosis   Prostate cancer Neg Hx    Kidney cancer Neg Hx    Bladder Cancer Neg Hx     No Known Allergies     Latest Ref Rng & Units 08/20/2022    9:53 AM 02/03/2022    9:29 AM 02/02/2022   10:06 AM  CBC  WBC 4.0 - 10.5 K/uL 5.9  6.1  6.3   Hemoglobin 13.0 - 17.0 g/dL 89.3  9.0  9.3   Hematocrit 39.0 - 52.0 % 31.3  25.8  26.9  Platelets 150 - 400 K/uL 188  132  128       CMP     Component Value Date/Time   NA 131 (L) 08/20/2022 0953   NA 139 02/04/2014 1449   K 4.4  08/20/2022 0953   K 4.6 02/04/2014 1449   CL 91 (L) 08/20/2022 0953   CL 105 02/04/2014 1449   CO2 30 08/20/2022 0953   CO2 27 02/04/2014 1449   GLUCOSE 99 08/20/2022 0953   GLUCOSE 84 02/04/2014 1449   BUN 52 (H) 08/20/2022 0953   BUN 23 (H) 02/04/2014 1449   CREATININE 6.06 (H) 08/20/2022 0953   CREATININE 1.25 02/04/2014 1449   CALCIUM  9.4 08/20/2022 0953   CALCIUM  8.7 02/04/2014 1449   PROT 7.4 01/31/2022 1723   ALBUMIN 3.8 01/31/2022 1723   AST 14 (L) 01/31/2022 1723   ALT 12 01/31/2022 1723   ALKPHOS 77 01/31/2022 1723   BILITOT 1.4 (H) 01/31/2022 1723   GFRNONAA 9 (L) 08/20/2022 0953   GFRNONAA >60 02/04/2014 1449     No results found.     Assessment & Plan:   1. ESRD (end stage renal disease) (HCC) (Primary) Recommend:  The patient is doing well and currently has adequate dialysis access. The patient's dialysis center is not reporting any access issues. Flow pattern is stable when compared to the prior ultrasound.  Patient advised to contact us  if there are additional issues.  The patient should have a duplex ultrasound of the dialysis access in 6 months. The patient will follow-up with me in the office after each ultrasound    2. Hyperlipidemia, unspecified hyperlipidemia type Continue statin as ordered and reviewed, no changes at this time   Current Outpatient Medications on File Prior to Visit  Medication Sig Dispense Refill   apixaban  (ELIQUIS ) 2.5 MG TABS tablet Take 2.5 mg by mouth 2 (two) times daily.     apixaban  (ELIQUIS ) 2.5 MG TABS tablet Take 1 tablet (2.5 mg total) by mouth 2 (two) times daily. 180 tablet 3   B Complex Vitamins (B-COMPLEX/B-12 PO) Take 1,000 mcg by mouth.     calcitRIOL  (ROCALTROL ) 0.25 MCG capsule Take 0.25 mcg by mouth daily.     cholecalciferol  (VITAMIN D3) 25 MCG (1000 UNIT) tablet Take 1,000 Units by mouth daily.     cyanocobalamin  (VITAMIN B12) 1000 MCG tablet Take 2,500 mcg by mouth daily.     Multiple Vitamin  (MULTIVITAMIN WITH MINERALS) TABS tablet Take 1 tablet by mouth daily.     rosuvastatin  (CRESTOR ) 10 MG tablet Take 10 mg by mouth at bedtime.     rosuvastatin  (CRESTOR ) 5 MG tablet Take 1 tablet (5 mg total) by mouth daily. 90 tablet 3   Saccharomyces boulardii (PROBIOTIC) 250 MG CAPS Take 1 capsule by mouth in the morning and at bedtime. 12 capsule 0   sevelamer  carbonate (RENVELA ) 800 MG tablet Take 1,600 mg by mouth 3 (three) times daily.     sevelamer  carbonate (RENVELA ) 800 MG tablet Take 2 tablets (1,600 mg total) by mouth 3 (three) times daily with meals. 180 tablet 11   torsemide  (DEMADEX ) 20 MG tablet Take 2 tablets (40 mg total) by mouth daily. 60 tablet 0   traZODone  (DESYREL ) 50 MG tablet Take 50 mg by mouth at bedtime as needed for sleep.     carvedilol  (COREG ) 3.125 MG tablet Take 6.25 mg by mouth daily. (Patient not taking: Reported on 07/15/2023)     No current facility-administered medications on file prior to  visit.    There are no Patient Instructions on file for this visit. No follow-ups on file.   Porshe Fleagle E Teron Blais, NP

## 2023-08-01 DIAGNOSIS — Z992 Dependence on renal dialysis: Secondary | ICD-10-CM | POA: Diagnosis not present

## 2023-08-01 DIAGNOSIS — N186 End stage renal disease: Secondary | ICD-10-CM | POA: Diagnosis not present

## 2023-08-03 DIAGNOSIS — Z992 Dependence on renal dialysis: Secondary | ICD-10-CM | POA: Diagnosis not present

## 2023-08-03 DIAGNOSIS — D631 Anemia in chronic kidney disease: Secondary | ICD-10-CM | POA: Diagnosis not present

## 2023-08-03 DIAGNOSIS — D509 Iron deficiency anemia, unspecified: Secondary | ICD-10-CM | POA: Diagnosis not present

## 2023-08-03 DIAGNOSIS — Z23 Encounter for immunization: Secondary | ICD-10-CM | POA: Diagnosis not present

## 2023-08-03 DIAGNOSIS — N186 End stage renal disease: Secondary | ICD-10-CM | POA: Diagnosis not present

## 2023-08-19 ENCOUNTER — Inpatient Hospital Stay: Payer: Medicare HMO

## 2023-08-19 DIAGNOSIS — M549 Dorsalgia, unspecified: Secondary | ICD-10-CM | POA: Diagnosis not present

## 2023-08-19 NOTE — Progress Notes (Signed)
 Lawrence Perkins. is a 83 y.o. male here for    CHIEF COMPLAINT:    Patient Active Problem List  Diagnosis  . Benign hypertension with CKD (chronic kidney disease) stage V (CMS/HHS-HCC)  . A-fib (CMS/HHS-HCC)  . Health care maintenance  . Age related osteoporosis  . MGUS (monoclonal gammopathy of unknown significance)  . Chronic systolic CHF (congestive heart failure), NYHA class 2 (CMS/HHS-HCC)  . Coronary artery disease involving native coronary artery of native heart  . Atherosclerosis of abdominal aorta ()  . Benign localized prostatic hyperplasia with lower urinary tract symptoms (LUTS)  . Primary osteoarthritis of left hip  . Prediabetes  . S/P TKR (total knee replacement) using cement, right     HISTORY OF PRESENT ILLNESS:  Lawrence Perkins. complains of  1 week of low back pain, started bending over to tie his shoes after water exercise. No leg pain or inconsitince, flexeril without help and ckd noted.   Past Medical History:  Diagnosis Date  . A-fib (CMS/HHS-HCC)   . Anemia in stage 4 chronic kidney disease  (CMS/HHS-HCC) 05/09/2021  . Atherosclerosis of abdominal aorta (CMS-HCC) 09/23/2017  . Atrial fibrillation (CMS/HHS-HCC)   . Atrial fibrillation (CMS/HHS-HCC)    intermittent with hip fx and GI bleed only at this point  . BPH (benign prostatic hypertrophy)    Followed by Dr. Ike  . Cardiac syncope 03/24/2019  . Cardiomyopathy, secondary (CMS/HHS-HCC)   . CHF (congestive heart failure) (CMS/HHS-HCC) 2018  . Chronic a-fib (CMS/HHS-HCC)   . Chronic kidney disease    Chronic Kidney disease, stage 4  . Colon polyp 2012   adenomatous with high grade dysplasia  . Coronary artery disease involving native coronary artery of native heart 09/23/2017  . COVID-19 11/14/2020  . Essential hypertension, benign   . Hx of adenomatous polyp of colon 10/01/2016  . Hx of colonic polyps    multiple including dysplasia  . Hyperplastic polyp of intestine    Hx of 8  including 1 transverse colon polyps x3 suggestive of adenomatous change and low grade dysplasia. There is colonic mucosa with prominent lymphoid agragate, deeper sections examined and neg for dysplasia there.  . Osteoporosis   . Paroxysmal A-fib (CMS/HHS-HCC)   . Pleural effusion   . Pleural effusion, bilateral   . Pure hypercholesterolemia     Past Surgical History:  Procedure Laterality Date  . COLONOSCOPY  01/30/2010   Adenomatous Polyp w/High Grade Dysplasia  . COLONOSCOPY  09/01/2010   Adenomatous Polyps  . COLONOSCOPY  09/28/2013   Adenomatous Polyps: CBF 09/2016; Recall Ltr mailed 08/21/2016 (dw)  . Removal of deep hardware and right total hip replacement Right 02/18/2014  . Total hip arthroplasty anterior approach Left 03/06/2016   Dr.[email or web address]  . COLONOSCOPY  01/02/2017   Adenomatous Polyps: CBF 01/2020  . APPENDECTOMY    . CATARACT EXTRACTION    . FRACTURE SURGERY    . HERNIA REPAIR    . Hip Pinning     After non displaced traumatic fx 2011  . JOINT REPLACEMENT    . KNEE ARTHROSCOPY    . TONSILLECTOMY       No fever chills or sweats   No nausea, vomiting or diarrhea  No chest pain, shortness of breath   Social History   Socioeconomic History  . Marital status: Married  Tobacco Use  . Smoking status: Former    Current packs/day: 0.00    Average packs/day: 1 pack/day for 27.0 years (27.0 ttl pk-yrs)  Types: Cigarettes    Quit date: 03/01/1981    Years since quitting: 42.4  . Smokeless tobacco: Never  . Tobacco comments:    distant hx of smoking and only 5-6 packs per year  Vaping Use  . Vaping status: Never Used  Substance and Sexual Activity  . Alcohol use: Not Currently    Alcohol/week: 2.0 - 3.0 standard drinks of alcohol    Types: 2 - 3 Shots of liquor per week    Comment: rarely  . Drug use: No  . Sexual activity: Not Currently    Partners: Female   Social Drivers of Health   Financial Resource Strain: Low Risk  (10/08/2022)   Overall Financial  Resource Strain (CARDIA)   . Difficulty of Paying Living Expenses: Not hard at all  Food Insecurity: No Food Insecurity (10/08/2022)   Hunger Vital Sign   . Worried About Programme researcher, broadcasting/film/video in the Last Year: Never true   . Ran Out of Food in the Last Year: Never true  Transportation Needs: No Transportation Needs (10/08/2022)   PRAPARE - Transportation   . Lack of Transportation (Medical): No   . Lack of Transportation (Non-Medical): No   [facility]   Social Network  Housing Stability: Unknown (01/17/2023)   Housing Stability Vital Sign   . Unable to Pay for Housing in the Last Year: No   . Homeless in the Last Year: No      Current Outpatient Medications:  .  acetaminophen  (TYLENOL ) 325 MG tablet, Take 2 tablets by mouth as needed, Disp: , Rfl:  .  apixaban  (ELIQUIS ) 2.5 mg tablet, TAKE ONE (1) TABLET BY MOUTH TWO TIMES PER DAY, Disp: 180 tablet, Rfl: 3 .  calcitRIOL  (ROCALTROL ) 0.25 MCG capsule, Take 0.25 mcg by mouth once daily, Disp: , Rfl:  .  calcium  carbonate-vitamin D3 (CALTRATE 600+D) 600 mg(1,500mg ) -200 unit tablet, Take 1 tablet by mouth 2 (two) times daily with meals, Disp: , Rfl:  .  cholecalciferol  (VITAMIN D3) 2,000 unit capsule, Take 2,000 Units by mouth once daily., Disp: , Rfl:  .  cyanocobalamin  (VITAMIN B12) 1000 MCG tablet, Take 1,000 mcg by mouth once daily, Disp: , Rfl:  .  cyclobenzaprine (FLEXERIL) 5 MG tablet, Take 1 tablet (5 mg total) by mouth 3 (three) times daily as needed for Muscle spasms for up to 10 days, Disp: 20 tablet, Rfl: 0 .  docusate (COLACE) 100 MG capsule, Take 1 capsule by mouth 2 (two) times daily, Disp: , Rfl:  .  multivit-min-folic-vit K-lycop (ONE-A-DAY MEN'S 50 PLUS) 400-20-370 mcg Tab, Take 1 tablet by mouth once daily, Disp: , Rfl:  .  sevelamer  carbonate (RENVELA ) 800 mg tablet, Take 800 mg by mouth 2 (two) times daily at lunch and dinner, Disp: , Rfl:  .  tamsulosin  (FLOMAX ) 0.4 mg capsule, Take 0.4 mg by mouth once  daily Take 30 minutes after same meal each day., Disp: , Rfl:  .  TORsemide  (DEMADEX ) 20 MG tablet, TAKE THREE TABLETS BY MOUTH ONCE DAILY, Disp: 270 tablet, Rfl: 1 .  traZODone  (DESYREL ) 50 MG tablet, TAKE ONE TABLET AT BEDTIME, Disp: 90 tablet, Rfl: 3 .  lactulose (ENULOSE) 10 gram/15 mL oral solution, Take 15 mLs by mouth 2 (two) times daily as needed for Constipation for up to 20 days, Disp: 500 mL, Rfl: 0 .  rosuvastatin  (CRESTOR ) 5 MG tablet, Take 1 tablet (5 mg total) by mouth once daily (Patient not taking: Reported on 08/19/2023), Disp: 90  tablet, Rfl: 3 .  Saccharomyces boulardii (FLORASTOR) 250 mg capsule, Take 250 mg by mouth 2 (two) times daily (Patient not taking: Reported on 08/19/2023), Disp: , Rfl:  .  traMADoL  (ULTRAM ) 50 mg tablet, Take 1-2 tablets (50-100 mg total) by mouth every 6 (six) hours as needed for Pain for up to 5 days, Disp: 35 tablet, Rfl: 0 .  vit A/vit C/vit E/zinc/copper (PRESERVISION AREDS ORAL), Take 1 capsule by mouth 2 (two) times daily (Patient not taking: Reported on 08/19/2023), Disp: , Rfl:   Vitals:   08/19/23 1312  BP: 121/72  Pulse: 75   Body mass index is 26.26 kg/m. No acute distress HEENT: Normocephalic atraumatic. TM's clear. Op clear.  Lungs; clear to ascultation Heart; Regular rate and rhythm  Abdomen; Soft and flat, normal bowel sounds Extremities; No clubbing, cyanosis or edema  No visits with results within 3 Month(s) from this visit.  Latest known visit with results is:  Appointment on 04/01/2023  Component Date Value Ref Range Status  . Glucose 04/01/2023 91  70 - 110 mg/dL Final  . Sodium 96/68/7974 139  136 - 145 mmol/L Final  . Potassium 04/01/2023 4.3  3.6 - 5.1 mmol/L Final  . Chloride 04/01/2023 96 (L)  97 - 109 mmol/L Final  . Carbon Dioxide (CO2) 04/01/2023 33.1 (H)  22.0 - 32.0 mmol/L Final  . Urea  Nitrogen (BUN) 04/01/2023 45 (H)  7 - 25 mg/dL Final  . Creatinine 96/68/7974 5.5 (H)  0.7 - 1.3 mg/dL Final  . Glomerular  Filtration Rate (eGFR) 04/01/2023 10 (L)  >60 mL/min/1.73sq m Final  . Calcium  04/01/2023 9.6  8.7 - 10.3 mg/dL Final  . AST  96/68/7974 7 (L)  8 - 39 U/L Final  . ALT  04/01/2023 8  6 - 57 U/L Final  . Alk Phos (alkaline Phosphatase) 04/01/2023 114 (H)  34 - 104 U/L Final  . Albumin 04/01/2023 4.1  3.5 - 4.8 g/dL Final  . Bilirubin, Total 04/01/2023 0.7  0.3 - 1.2 mg/dL Final  . Protein, Total 04/01/2023 6.6  6.1 - 7.9 g/dL Final  . A/G Ratio 96/68/7974 1.6  1.0 - 5.0 gm/dL Final  . Hemoglobin J8R 04/01/2023 5.4  4.2 - 5.6 % Final  . Average Blood Glucose (Calc) 04/01/2023 108  mg/dL Final  . Cholesterol, Total 04/01/2023 114  100 - 200 mg/dL Final  . Triglyceride 96/68/7974 122  35 - 199 mg/dL Final  . HDL (High Density Lipoprotein) Cho* 04/01/2023 49.2  29.0 - 71.0 mg/dL Final  . LDL Calculated 04/01/2023 40  0 - 130 mg/dL Final  . VLDL Cholesterol 04/01/2023 24  mg/dL Final  . Cholesterol/HDL Ratio 04/01/2023 2.3   Final  . Creatinine, Random Urine 04/01/2023 83.1  40.0 - 300.0 mg/dL Final  . Urine Albumin, Random 04/01/2023 638    mg/L Final  . Urine Albumin/Creatinine Ratio 04/01/2023 767.7 (H)  <30.0 ug/mg Final  . WBC (White Blood Cell Count) 04/01/2023 5.3  4.1 - 10.2 10^3/uL Final  . RBC (Red Blood Cell Count) 04/01/2023 3.17 (L)  4.69 - 6.13 10^6/uL Final  . Hemoglobin 04/01/2023 11.4 (L)  14.1 - 18.1 gm/dL Final  . Hematocrit 96/68/7974 33.8 (L)  40.0 - 52.0 % Final  . MCV (Mean Corpuscular Volume) 04/01/2023 106.6 (H)  80.0 - 100.0 fl Final  . MCH (Mean Corpuscular Hemoglobin) 04/01/2023 36.0 (H)  27.0 - 31.2 pg Final  . MCHC (Mean Corpuscular Hemoglobin * 04/01/2023 33.7  32.0 - 36.0 gm/dL Final  .  Platelet Count 04/01/2023 191  150 - 450 10^3/uL Final  . RDW-CV (Red Cell Distribution Widt* 04/01/2023 13.2  11.6 - 14.8 % Final  . MPV (Mean Platelet Volume) 04/01/2023 10.0  9.4 - 12.4 fl Final  . Neutrophils 04/01/2023 3.89  1.50 - 7.80 10^3/uL Final  . Lymphocytes  04/01/2023 0.79 (L)  1.00 - 3.60 10^3/uL Final  . Monocytes 04/01/2023 0.39  0.00 - 1.50 10^3/uL Final  . Eosinophils 04/01/2023 0.17  0.00 - 0.55 10^3/uL Final  . Basophils 04/01/2023 0.02  0.00 - 0.09 10^3/uL Final  . Neutrophil % 04/01/2023 73.8 (H)  32.0 - 70.0 % Final  . Lymphocyte % 04/01/2023 15.0  10.0 - 50.0 % Final  . Monocyte % 04/01/2023 7.4  4.0 - 13.0 % Final  . Eosinophil % 04/01/2023 3.2  1.0 - 5.0 % Final  . Basophil% 04/01/2023 0.4  0.0 - 2.0 % Final  . Immature Granulocyte % 04/01/2023 0.2  <=0.7 % Final  . Immature Granulocyte Count 04/01/2023 0.01  <=0.06 10^3/L Final    ASSESSMENT  AND PLAN:  Diagnoses and all orders for this visit:  Back pain without sciatica -     X-ray lumbar spine 2 to 3 views; Future -     Comprehensive Metabolic Panel (CMP) -     Urinalysis w/Microscopic -     Urine Culture, Routine - [facility] -     CBC w/auto Differential (5 Part)  Other orders -     traMADoL  (ULTRAM ) 50 mg tablet; Take 1-2 tablets (50-100 mg total) by mouth every 6 (six) hours as needed for Pain for up to 5 days -     lactulose (ENULOSE) 10 gram/15 mL oral solution; Take 15 mLs by mouth 2 (two) times daily as needed for Constipation for up to 20 days   Stretching printed out for him to do

## 2023-08-20 ENCOUNTER — Other Ambulatory Visit: Payer: Medicare HMO

## 2023-08-20 NOTE — Telephone Encounter (Signed)
 Wife notified, MRI ordered. Wife verbalized understanding.

## 2023-08-20 NOTE — Telephone Encounter (Signed)
-----   Message from [facility], III, MD sent at 08/20/2023 11:05 AM EDT ----- Sent cipro in for ? Uti per ua and need MRI of T spine for t12 compression fx  ----- Message ----- From: Neill Yvonna Bruckner, MD Sent: 08/19/2023   8:25 PM EDT To: Layman Tanda Lenon [name], MD

## 2023-08-21 ENCOUNTER — Ambulatory Visit
Admission: RE | Admit: 2023-08-21 | Discharge: 2023-08-21 | Disposition: A | Discharge status: Home / Self Care | Source: Ambulatory Visit | Attending: Internal Medicine | Admitting: Internal Medicine

## 2023-08-21 ENCOUNTER — Other Ambulatory Visit: Payer: Self-pay | Admitting: Internal Medicine

## 2023-08-21 DIAGNOSIS — R109 Unspecified abdominal pain: Secondary | ICD-10-CM | POA: Diagnosis not present

## 2023-08-21 DIAGNOSIS — K5730 Diverticulosis of large intestine without perforation or abscess without bleeding: Secondary | ICD-10-CM | POA: Diagnosis not present

## 2023-08-21 DIAGNOSIS — N281 Cyst of kidney, acquired: Secondary | ICD-10-CM | POA: Diagnosis not present

## 2023-08-21 DIAGNOSIS — R935 Abnormal findings on diagnostic imaging of other abdominal regions, including retroperitoneum: Secondary | ICD-10-CM | POA: Diagnosis not present

## 2023-08-21 MED ORDER — IOHEXOL 300 MG/ML  SOLN
100.0000 mL | Freq: Once | INTRAMUSCULAR | Status: AC | PRN
  Administered 2023-08-21: 100 mL via INTRAVENOUS

## 2023-08-22 ENCOUNTER — Other Ambulatory Visit: Payer: Self-pay | Admitting: Internal Medicine

## 2023-08-22 ENCOUNTER — Ambulatory Visit
Admission: RE | Admit: 2023-08-22 | Discharge: 2023-08-22 | Disposition: A | Discharge status: Home / Self Care | Source: Ambulatory Visit | Attending: Internal Medicine | Admitting: Internal Medicine

## 2023-08-22 DIAGNOSIS — S22080A Wedge compression fracture of T11-T12 vertebra, initial encounter for closed fracture: Secondary | ICD-10-CM | POA: Insufficient documentation

## 2023-08-22 DIAGNOSIS — M549 Dorsalgia, unspecified: Secondary | ICD-10-CM | POA: Diagnosis not present

## 2023-08-22 DIAGNOSIS — M4854XA Collapsed vertebra, not elsewhere classified, thoracic region, initial encounter for fracture: Secondary | ICD-10-CM | POA: Diagnosis not present

## 2023-08-22 DIAGNOSIS — M4804 Spinal stenosis, thoracic region: Secondary | ICD-10-CM | POA: Diagnosis not present

## 2023-08-22 DIAGNOSIS — M47814 Spondylosis without myelopathy or radiculopathy, thoracic region: Secondary | ICD-10-CM | POA: Diagnosis not present

## 2023-08-27 ENCOUNTER — Ambulatory Visit: Payer: Medicare HMO | Admitting: Nurse Practitioner

## 2023-08-30 ENCOUNTER — Ambulatory Visit
Admission: RE | Admit: 2023-08-30 | Discharge: 2023-08-30 | Disposition: A | Discharge status: Home / Self Care | Source: Ambulatory Visit | Attending: Physician Assistant | Admitting: Physician Assistant

## 2023-08-30 ENCOUNTER — Other Ambulatory Visit: Payer: Self-pay | Admitting: Physician Assistant

## 2023-08-30 DIAGNOSIS — R109 Unspecified abdominal pain: Secondary | ICD-10-CM | POA: Diagnosis not present

## 2023-08-30 DIAGNOSIS — J929 Pleural plaque without asbestos: Secondary | ICD-10-CM | POA: Diagnosis not present

## 2023-08-30 DIAGNOSIS — M7989 Other specified soft tissue disorders: Secondary | ICD-10-CM

## 2023-08-30 DIAGNOSIS — I5022 Chronic systolic (congestive) heart failure: Secondary | ICD-10-CM | POA: Insufficient documentation

## 2023-08-30 DIAGNOSIS — J90 Pleural effusion, not elsewhere classified: Secondary | ICD-10-CM | POA: Diagnosis not present

## 2023-08-30 DIAGNOSIS — D71 Functional disorders of polymorphonuclear neutrophils: Secondary | ICD-10-CM | POA: Diagnosis not present

## 2023-08-30 DIAGNOSIS — I2510 Atherosclerotic heart disease of native coronary artery without angina pectoris: Secondary | ICD-10-CM | POA: Diagnosis not present

## 2023-08-30 MED ORDER — IOHEXOL 350 MG/ML SOLN
75.0000 mL | Freq: Once | INTRAVENOUS | Status: AC | PRN
  Administered 2023-08-30: 75 mL via INTRAVENOUS

## 2023-09-01 DIAGNOSIS — Z992 Dependence on renal dialysis: Secondary | ICD-10-CM | POA: Diagnosis not present

## 2023-09-01 DIAGNOSIS — N186 End stage renal disease: Secondary | ICD-10-CM | POA: Diagnosis not present

## 2023-09-02 DIAGNOSIS — N186 End stage renal disease: Secondary | ICD-10-CM | POA: Diagnosis not present

## 2023-09-02 DIAGNOSIS — D509 Iron deficiency anemia, unspecified: Secondary | ICD-10-CM | POA: Diagnosis not present

## 2023-09-02 DIAGNOSIS — D631 Anemia in chronic kidney disease: Secondary | ICD-10-CM | POA: Diagnosis not present

## 2023-09-02 DIAGNOSIS — Z992 Dependence on renal dialysis: Secondary | ICD-10-CM | POA: Diagnosis not present

## 2023-09-03 ENCOUNTER — Other Ambulatory Visit: Payer: Self-pay | Admitting: *Deleted

## 2023-09-03 ENCOUNTER — Ambulatory Visit: Payer: Medicare HMO | Admitting: Nurse Practitioner

## 2023-09-03 ENCOUNTER — Inpatient Hospital Stay: Attending: Oncology

## 2023-09-03 DIAGNOSIS — D472 Monoclonal gammopathy: Secondary | ICD-10-CM | POA: Insufficient documentation

## 2023-09-03 DIAGNOSIS — I132 Hypertensive heart and chronic kidney disease with heart failure and with stage 5 chronic kidney disease, or end stage renal disease: Secondary | ICD-10-CM | POA: Insufficient documentation

## 2023-09-03 DIAGNOSIS — Z992 Dependence on renal dialysis: Secondary | ICD-10-CM | POA: Insufficient documentation

## 2023-09-03 DIAGNOSIS — Z79899 Other long term (current) drug therapy: Secondary | ICD-10-CM | POA: Diagnosis not present

## 2023-09-03 DIAGNOSIS — N186 End stage renal disease: Secondary | ICD-10-CM | POA: Diagnosis not present

## 2023-09-03 LAB — CMP (CANCER CENTER ONLY)
ALT: 11 U/L (ref 0–44)
AST: 17 U/L (ref 15–41)
Albumin: 4.1 g/dL (ref 3.5–5.0)
Alkaline Phosphatase: 121 U/L (ref 38–126)
Anion gap: 10 (ref 5–15)
BUN: 21 mg/dL (ref 8–23)
CO2: 31 mmol/L (ref 22–32)
Calcium: 9.1 mg/dL (ref 8.9–10.3)
Chloride: 91 mmol/L — ABNORMAL LOW (ref 98–111)
Creatinine: 3.47 mg/dL — ABNORMAL HIGH (ref 0.61–1.24)
GFR, Estimated: 17 mL/min — ABNORMAL LOW (ref >60)
Glucose, Bld: 102 mg/dL — ABNORMAL HIGH (ref 70–99)
Potassium: 3.6 mmol/L (ref 3.5–5.1)
Sodium: 132 mmol/L — ABNORMAL LOW (ref 135–145)
Total Bilirubin: 0.8 mg/dL (ref 0.0–1.2)
Total Protein: 7.5 g/dL (ref 6.5–8.1)

## 2023-09-03 LAB — CBC WITH DIFFERENTIAL (CANCER CENTER ONLY)
Abs Immature Granulocytes: 0.03 K/uL (ref 0.00–0.07)
Basophils Absolute: 0 K/uL (ref 0.0–0.1)
Basophils Relative: 0 %
Eosinophils Absolute: 0.2 K/uL (ref 0.0–0.5)
Eosinophils Relative: 2 %
HCT: 34.9 % — ABNORMAL LOW (ref 39.0–52.0)
Hemoglobin: 11.6 g/dL — ABNORMAL LOW (ref 13.0–17.0)
Immature Granulocytes: 1 %
Lymphocytes Relative: 8 %
Lymphs Abs: 0.5 K/uL — ABNORMAL LOW (ref 0.7–4.0)
MCH: 34.7 pg — ABNORMAL HIGH (ref 26.0–34.0)
MCHC: 33.2 g/dL (ref 30.0–36.0)
MCV: 104.5 fL — ABNORMAL HIGH (ref 80.0–100.0)
Monocytes Absolute: 0.4 K/uL (ref 0.1–1.0)
Monocytes Relative: 6 %
Neutro Abs: 5.5 K/uL (ref 1.7–7.7)
Neutrophils Relative %: 83 %
Platelet Count: 227 K/uL (ref 150–400)
RBC: 3.34 MIL/uL — ABNORMAL LOW (ref 4.22–5.81)
RDW: 13.2 % (ref 11.5–15.5)
WBC Count: 6.6 K/uL (ref 4.0–10.5)
nRBC: 0 % (ref 0.0–0.2)

## 2023-09-05 LAB — KAPPA/LAMBDA LIGHT CHAINS
Kappa free light chain: 78.2 mg/L — ABNORMAL HIGH (ref 3.3–19.4)
Kappa, lambda light chain ratio: 0.19 — ABNORMAL LOW (ref 0.26–1.65)
Lambda free light chains: 412.6 mg/L — ABNORMAL HIGH (ref 5.7–26.3)

## 2023-09-06 LAB — PROTEIN ELECTROPHORESIS, SERUM
A/G Ratio: 1.3 (ref 0.7–1.7)
Albumin ELP: 3.9 g/dL (ref 2.9–4.4)
Alpha-1-Globulin: 0.3 g/dL (ref 0.0–0.4)
Alpha-2-Globulin: 0.7 g/dL (ref 0.4–1.0)
Beta Globulin: 1.1 g/dL (ref 0.7–1.3)
Gamma Globulin: 1 g/dL (ref 0.4–1.8)
Globulin, Total: 3.1 g/dL (ref 2.2–3.9)
M-Spike, %: 0.5 g/dL — ABNORMAL HIGH
Total Protein ELP: 7 g/dL (ref 6.0–8.5)

## 2023-09-06 LAB — IGG, IGA, IGM
IgA: 464 mg/dL — ABNORMAL HIGH (ref 61–437)
IgG (Immunoglobin G), Serum: 964 mg/dL (ref 603–1613)
IgM (Immunoglobulin M), Srm: 41 mg/dL (ref 15–143)

## 2023-09-09 ENCOUNTER — Encounter: Payer: Self-pay | Admitting: Nurse Practitioner

## 2023-09-09 ENCOUNTER — Other Ambulatory Visit: Payer: Self-pay

## 2023-09-09 ENCOUNTER — Inpatient Hospital Stay (HOSPITAL_BASED_OUTPATIENT_CLINIC_OR_DEPARTMENT_OTHER): Payer: Medicare HMO | Admitting: Nurse Practitioner

## 2023-09-09 ENCOUNTER — Telehealth: Payer: Self-pay | Admitting: *Deleted

## 2023-09-09 VITALS — BP 131/65 | HR 62 | Temp 97.3°F | Ht 70.0 in | Wt 176.6 lb

## 2023-09-09 DIAGNOSIS — S22000A Wedge compression fracture of unspecified thoracic vertebra, initial encounter for closed fracture: Secondary | ICD-10-CM

## 2023-09-09 DIAGNOSIS — D472 Monoclonal gammopathy: Secondary | ICD-10-CM | POA: Diagnosis not present

## 2023-09-09 NOTE — Telephone Encounter (Signed)
 RN spoke with Happi at Dr. Lamount office. Left msg with receptionist to have Dr. Lenon to call Tinnie Dawn, NP directly re: ordering another MRI.

## 2023-09-09 NOTE — Progress Notes (Signed)
 [facility]  Telephone: [phone] Fax: [phone]  ID: Lawrence Perkins. OB: 1940/04/05  MR#: [id number]  RDW#:[id number]  Patient Care Team: Lenon Layman ORN, MD as PCP - General (Internal Medicine)  CHIEF COMPLAINT: MGUS  INTERVAL HISTORY: Patient returns to clinic for repeat laboratory work and further evaluation. 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 He presented to his PCP with complaints of low back pain without sciatica. He has lost about 40 lbs since starting dialysis in 2023. No relief with flexeril. Minimizes tramadol  d/t worsening constipation. Otherwise denies bone pain elsewhere. He continues dialysis 3 days a week. No recent infections. Denies weakness or confusion. Denies abdominal pain.   REVIEW OF SYSTEMS:   Review of Systems  Constitutional: Negative.  Negative for fever, malaise/fatigue and weight loss.  Respiratory: Negative.  Negative for cough and shortness of breath.   Cardiovascular: Negative.  Negative for chest pain and leg swelling.  Gastrointestinal: Negative.  Negative for abdominal pain.  Genitourinary: Negative.  Negative for dysuria.  Musculoskeletal: Negative.  Negative for back pain.  Skin: Negative.  Negative for rash.  Neurological: Negative.  Negative for focal weakness, weakness and headaches.  Endo/Heme/Allergies:  Does not bruise/bleed easily.  Psychiatric/Behavioral: Negative.  The patient is not nervous/anxious.   As per HPI. Otherwise, a complete review of systems is negative.  PAST MEDICAL HISTORY: Past Medical History:  Diagnosis Date   Anemia    Aortic atherosclerosis (HCC)    Arthritis    Atrial fibrillation and flutter (HCC)    a.) CHA2DS2-VASc = 5 (age x 2, CHF, HTN, aortic plaque). b.) rate/rhythm maintain on oral carvedilol ; chronically anticoagulated with dose reduced apixaban    Bilateral inguinal hernia    BPH (benign prostatic hyperplasia)    Cardiac syncope    Cardiomyopathy  (HCC)    a.) TTE 04/23/2016: EF 40%. b.) TTE 09/03/2016: EF 50%. c.) TTE 05/04/2019: EF >55%.   CHF (congestive heart failure) (HCC)    a.) TTE 04/23/2016: EF 40%; BAE; mild AR/TR, mod MR; G1DD. b.) TTE 09/03/2016: EF 50%; mild LVH; BAE; triv AR/PR, mod MR/TR. c.) TTE 05/04/2019: EF >55%; mod LVH, mild LA enlargement; mild AR/MR/TR   CKD (chronic kidney disease), stage V (HCC)    Complication of anesthesia    a.) h/o intra/postoperative A.fib   Coronary artery disease    Diverticulosis    Gout    History of 2019 novel coronavirus disease (COVID-19) 11/14/2020   History of colon polyps    Hypercholesteremia    Hypertension    Long term current use of anticoagulant    a.) dose reduced apixaban ; dose reduction d/t to age and CKD Dx.   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HCC)    a.) s/p resection   MGUS (monoclonal gammopathy of unknown significance)    Pleural effusion    Rectal bleeding    Secondary hyperparathyroidism of renal origin [facility])    PAST SURGICAL HISTORY: Past Surgical History:  Procedure Laterality Date   A/V FISTULAGRAM Left 06/11/2022   Procedure: A/V Fistulagram;  Surgeon: Marea Selinda RAMAN, MD;  Location: [facility] INV[facility]IVE CV [facility];  Service: Cardiovascular;  Laterality: Left;   A/V FISTULAGRAM Left 10/02/2022   Procedure: A/V Fistulagram;  Surgeon: Marea Selinda RAMAN, MD;  Location: [facility];  Service: Cardiovascular;  Laterality: Left;   A/V FISTULAGRAM Left 06/17/2023   Procedure: A/V Fistulagram;  Surgeon: Marea Selinda RAMAN, MD;  Location: [facility];  Service: Cardiovascular;  Laterality: Left;  ANKLE SURGERY Right 1985   following volleyball injury   APPENDECTOMY     at the of 9 yrs   AV FISTULA PLACEMENT Left 01/04/2022   Procedure: ARTERIOVENOUS (AV) FISTULA CREATION;  Surgeon: Marea Selinda RAMAN, MD;  Location: [facility] ORS;  Service: Vascular;  Laterality: Left;   CAPD INSERTION N/A 08/10/2021   Procedure: LAPAROSCOPIC INSERTION CONTINUOUS AMBULATORY PERITONEAL DIALYSIS  (CAPD)  CATHETER;  Surgeon: Jordis Laneta FALCON, MD;  Location: [facility] OR[facility];  Service: General;  Laterality: N/A;   CAPD REVISION N/A 09/21/2021   Procedure: LAPAROSCOPIC REVISION CONTINUOUS AMBULATORY PERITONEAL DIALYSIS  (CAPD) CATHETER;  Surgeon: Jordis Laneta FALCON, MD;  Location: [facility] OR[facility];  Service: General;  Laterality: N/A;   CATARACT EXTRACTION Left    COLONOSCOPY     COLONOSCOPY WITH PROPOFOL  N/A 01/02/2017   Procedure: COLONOSCOPY WITH PROPOFOL ;  Surgeon: Viktoria Lamar DASEN, MD;  Location: [facility] ENDOSCOPY;  Service: Endoscopy;  Laterality: N/A;   DIALYSIS/PERMA CATHETER INSERTION N/A 03/27/2021   Procedure: DIALYSIS/PERMA CATHETER INSERTION;  Surgeon: Marea Selinda RAMAN, MD;  Location: [facility];  Service: Cardiovascular;  Laterality: N/A;   DIALYSIS/PERMA CATHETER INSERTION N/A 10/10/2021   Procedure: DIALYSIS/PERMA CATHETER INSERTION;  Surgeon: Jama Cordella MATSU, MD;  Location: [facility];  Service: Cardiovascular;  Laterality: N/A;   DIALYSIS/PERMA CATHETER REMOVAL N/A 05/08/2021   Procedure: DIALYSIS/PERMA CATHETER REMOVAL;  Surgeon: Marea Selinda RAMAN, MD;  Location: [facility];  Service: Cardiovascular;  Laterality: N/A;   DIALYSIS/PERMA CATHETER REMOVAL N/A 04/17/2022   Procedure: DIALYSIS/PERMA CATHETER REMOVAL;  Surgeon: Jama Cordella MATSU, MD;  Location: [facility];  Service: Cardiovascular;  Laterality: N/A;   INSERTION OF MESH  08/10/2021   Procedure: INSERTION OF MESH;  Surgeon: Jordis Laneta FALCON, MD;  Location: [facility];  Service: General;;   JOINT REPLACEMENT     RIGHT TOTAL HIP   KNEE ARTHROSCOPY WITH LATERAL RELEASE Right 04/20/2021   Procedure: Right knee medial retinaculum and lateral release with polyethylene exchange;  Surgeon: Kathlynn Sharper, MD;  Location: [facility] OR[facility];  Service: Orthopedics;  Laterality: Right;   TONSILLECTOMY     as a child   TOTAL HIP ARTHROPLASTY  09/2009   Right femoral neck fracture   TOTAL HIP ARTHROPLASTY Left 03/06/2016   Procedure:  TOTAL HIP ARTHROPLASTY ANTERIOR APPROACH;  Surgeon: Sharper Kathlynn, MD;  Location: [facility];  Service: Orthopedics;  Laterality: Left;   TOTAL KNEE ARTHROPLASTY Right 03/21/2021   Procedure: TOTAL KNEE ARTHROPLASTY;  Surgeon: Kathlynn Sharper, MD;  Location: [facility];  Service: Orthopedics;  Laterality: Right;   UMBILICAL HERNIA REPAIR  08/10/2021   Procedure: HERNIA REPAIR UMBILICAL ADULT;  Surgeon: Jordis Laneta FALCON, MD;  Location: [facility];  Service: General;;   FAMILY HISTORY: Family History  Problem Relation Age of Onset   Cirrhosis Mother    Other Father        Lung Fibrosis   Prostate cancer Neg Hx    Kidney cancer Neg Hx    Bladder Cancer Neg Hx    ADVANCED DIRECTIVES (Y/N):  N  HEALTH MAINTENANCE: Social History   Tobacco Use   Smoking status: Former    Current packs/day: 0.00    Types: Cigarettes    Start date: 01/01/1966    Quit date: 01/01/1981    Years since quitting: 42.7    Passive exposure: Past   Smokeless tobacco: Never  Vaping Use   Vaping status: Never Used  Substance Use Topics   Alcohol use: Not Currently  Drug use: No    Colonoscopy:  Bone density:  Lipid panel:  No Known Allergies  Current Outpatient Medications  Medication Sig Dispense Refill   apixaban  (ELIQUIS ) 2.5 MG TABS tablet Take 1 tablet (2.5 mg total) by mouth 2 (two) times daily. 180 tablet 3   B Complex Vitamins (B-COMPLEX/B-12 PO) Take 1,000 mcg by mouth.     calcitRIOL  (ROCALTROL ) 0.25 MCG capsule Take 0.25 mcg by mouth daily.     cholecalciferol  (VITAMIN D3) 25 MCG (1000 UNIT) tablet Take 1,000 Units by mouth daily.     cyanocobalamin  (VITAMIN B12) 1000 MCG tablet Take 2,500 mcg by mouth daily.     Multiple Vitamin (MULTIVITAMIN WITH MINERALS) TABS tablet Take 1 tablet by mouth daily.     sevelamer  carbonate (RENVELA ) 800 MG tablet Take 2 tablets (1,600 mg total) by mouth 3 (three) times daily with meals. 180 tablet 11   torsemide  (DEMADEX ) 20 MG tablet Take 2 tablets (40 mg total) by  mouth daily. 60 tablet 0   traZODone  (DESYREL ) 50 MG tablet Take 50 mg by mouth at bedtime as needed for sleep.     apixaban  (ELIQUIS ) 2.5 MG TABS tablet Take 2.5 mg by mouth 2 (two) times daily.     carvedilol  (COREG ) 3.125 MG tablet Take 6.25 mg by mouth daily. (Patient not taking: Reported on 09/09/2023)     rosuvastatin  (CRESTOR ) 10 MG tablet Take 10 mg by mouth at bedtime. (Patient not taking: Reported on 09/09/2023)     rosuvastatin  (CRESTOR ) 5 MG tablet Take 1 tablet (5 mg total) by mouth daily. (Patient not taking: Reported on 09/09/2023) 90 tablet 3   Saccharomyces boulardii (PROBIOTIC) 250 MG CAPS Take 1 capsule by mouth in the morning and at bedtime. (Patient not taking: Reported on 09/09/2023) 12 capsule 0   sevelamer  carbonate (RENVELA ) 800 MG tablet Take 1,600 mg by mouth 3 (three) times daily.     No current facility-administered medications for this visit.   OBJECTIVE: Vitals:   09/09/23 0928  BP: 131/65  Pulse: 62  Temp: (!) 97.3 F (36.3 C)  SpO2: 100%     Body mass index is 25.34 kg/m.    ECOG FS:0 - Asymptomatic  General: Well-developed, well-nourished, no acute distress. Appears uncomfortable sitting in chair. Accompanied by wife.  Eyes: Pink conjunctiva, anicteric sclera. Lungs: Clear to auscultation bilaterally.  No audible wheezing or coughing Heart: Regular rate and rhythm.  Abdomen: Soft, nontender, nondistended.  Musculoskeletal: No edema, cyanosis, or clubbing. No spinal tenderness. No muscle tenderness. Ambulating w/o aids Neuro: Alert, answering all questions appropriately. Cranial nerves grossly intact. Skin: No rashes or petechiae noted. Psych: Normal affect.  LAB RESULTS: Lab Results  Component Value Date   NA 132 (L) 09/03/2023   K 3.6 09/03/2023   CL 91 (L) 09/03/2023   CO2 31 09/03/2023   GLUCOSE 102 (H) 09/03/2023   BUN 21 09/03/2023   CREATININE 3.47 (H) 09/03/2023   CALCIUM  9.1 09/03/2023   PROT 7.5 09/03/2023   ALBUMIN 4.1 09/03/2023   AST  17 09/03/2023   ALT 11 09/03/2023   ALKPHOS 121 09/03/2023   BILITOT 0.8 09/03/2023   GFRNONAA 17 (L) 09/03/2023   GFRAA 22 (L) 09/15/2019   Lab Results  Component Value Date   WBC 6.6 09/03/2023   NEUTROABS 5.5 09/03/2023   HGB 11.6 (L) 09/03/2023   HCT 34.9 (L) 09/03/2023   MCV 104.5 (H) 09/03/2023   PLT 227 09/03/2023   Lab Results  Component Value Date  TOTALPROTELP 7.0 09/03/2023   ALBUMINELP 3.9 09/03/2023   A1GS 0.3 09/03/2023   A2GS 0.7 09/03/2023   BETS 1.1 09/03/2023   GAMS 1.0 09/03/2023   MSPIKE 0.5 (H) 09/03/2023   SPEI Comment 09/03/2023   Component Ref Range & Units (hover) 6 d ago (09/03/23) 1 yr ago (08/20/22) 2 yr ago (08/09/21) 2 yr ago (05/04/21) 2 yr ago (09/20/20) 3 yr ago (09/15/19) 4 yr ago (03/11/19)  Kappa free light chain 78.2 High  102.0 High  96.5 High  103.8 High  107.0 High  98.9 High  119.0 High   Lambda free light chains 412.6 High  331.8 High  264.4 High  213.5 High  214.6 High  150.3 High  231.5 High   Kappa, lambda light chain ratio 0.19 Low  0.31 CM 0.36 CM 0.49 CM 0.50 CM 0.66 CM 0.51 CM   Component Ref Range & Units (hover) 6 d ago (09/03/23) 1 yr ago (08/20/22) 2 yr ago (08/09/21) 2 yr ago (05/04/21) 2 yr ago (09/20/20) 3 yr ago (09/15/19) 4 yr ago (03/11/19)  IgG (Immunoglobin G), Serum 964 917 762 681 729 1,037 1,023  IgA 464 High  449 High  386 390 420 383 345  IgM (Immunoglobulin M), Srm 41 37 CM 39 CM 37 CM 40 CM 46 CM 39 CM    STUDIES: MR THORACIC SPINE WO CONTRAST Result Date: 09/02/2023 EXAM: MRI THORACIC SPINE WITHOUT INTRAVENOUS CONTRAST 08/22/2023 04:01:36 PM TECHNIQUE: Multiplanar multisequence MRI of the thoracic spine was performed without the administration of intravenous contrast. COMPARISON: CT of the chest dated 08/30/2023. CLINICAL HISTORY: Back pain x 1 week. FINDINGS: BONES AND ALIGNMENT: The thoracic vertebrae are diffusely abnormal in signal intensity with a salt and pepper appearance. There is an acute compression  fracture of T12 with buckling of the posterior superior corner of the vertebral body causing mild central spinal canal stenosis. There is downward bowing of the superior endplate and mild loss of the vertebral body height. There are endplate irregularities also present at T8-9. The abnormal bone marrow signal could be secondary to multiple myeloma, metastatic disease, or renal osteodystrophy. SPINAL CORD: Normal spinal cord volume. Normal spinal cord signal. SOFT TISSUES: Numerous circumscribed foci of increased T2 signal present within the visualized ribs bilaterally. DEGENERATIVE CHANGES: There is disc space narrowing present throughout the thoracic spine. There is no significant disc herniation or spinal canal or neural foraminal stenosis. IMPRESSION: 1. Abnormal bone marrow signal in the thoracic vertebrae and ribs, with differential considerations including multiple myeloma, metastatic disease, or renal osteodystrophy. 2. Acute compression fracture of T12 with buckling of the posterior superior corner of the vertebral body causing mild central spinal canal stenosis. Electronically signed by: Evalene Coho MD 09/02/2023 02:17 PM EDT RP Workstation: [id number]   CT Angio Chest Pulmonary Embolism (PE) W or WO Contrast Result Date: 08/30/2023 EXAM: CTA CHEST 08/30/2023 04:23:32 PM TECHNIQUE: CTA of the chest was performed after the administration of intravenous contrast. Multiplanar reformatted images are provided for review. MIP images are provided for review. Automated exposure control, iterative reconstruction, and/or weight based adjustment of the mA/kV was utilized to reduce the radiation dose to as low as reasonably achievable. COMPARISON: Cardiac CT 01/16/2023, chest CTA 04/19/2016 CLINICAL HISTORY: Abdominal pain, unspecified abdominal location, Chronic systolic CHF (congestive heart failure) FINDINGS: PULMONARY ARTERIES: Pulmonary arteries are adequately opacified. No acute pulmonary embolus. Main  pulmonary artery is normal in caliber. MEDIASTINUM: The heart is mildly enlarged. There are coronary artery calcifications. Aortic atherosclerosis. No contrast  in the aorta to assess for aortic pathology. No pericardial effusion. decompressed esophagus. LYMPH NODES: Calcified mediastinal and right hilar lymph nodes typical of prior granulomatous disease. No noncalcified adenopathy. LUNGS AND PLEURA: Central bronchial thickening. Minimal mucoid impaction within subsegmental lower lobe bronchi. Minimal atelectasis in the dependent left lower lobe. Calcified pleural plaques in the left hemithorax. Moderate right pleural effusion. Calcified granuloma in the right lower lobe. UPPER ABDOMEN: Contrast refluxes into the hepatic veins and IVC. SOFT TISSUES AND BONES: Mild T12 superior endplate compression fracture, assessed on recent thoracic spine MRI. Scattered schmorl's nodes and degenerative change throughout the spine. Bilateral glenohumeral osteoarthritis. IMPRESSION: 1. No pulmonary embolism. 2. Moderate right pleural effusion. 3. 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 4. T12 supranuclear compression deformity, assessed on recent thoracic spine MRI. There has been slight progressive loss of height since 08/21/23 abdominal CT. Electronically signed by: Andrea Gasman MD 08/30/2023 04:43 PM EDT RP Workstation: [id number]   US  Venous Img Lower Bilateral (DVT) Result Date: 08/30/2023 EXAM: ULTRASOUND DUPLEX OF THE BILATERAL LOWER EXTREMITY VEINS TECHNIQUE: Duplex ultrasound using B-mode/gray scaled imaging and Doppler spectral analysis and color flow was obtained of the deep venous structures of the bilateral lower extremity. COMPARISON: Portions included from abdominal/pelvis CT 08/21/23. CLINICAL HISTORY: Right leg swelling. Filling defect versus artifact on recent abdominal pelvic CT in the common femoral veins FINDINGS: The visualized veins of the lower extremity are  patent and free of echogenic thrombus. The veins demonstrate good compressibility with normal color flow study and spectral analysis. IMPRESSION: 1. No evidence of bilateral lower extremity  DVT. Electronically signed by: Andrea Gasman MD 08/30/2023 04:27 PM EDT RP Workstation: [id number]   CT ABDOMEN PELVIS W WO CONTRAST Result Date: 08/21/2023 CLINICAL DATA:  Right lower back pain. EXAM: CT ABDOMEN AND PELVIS WITHOUT AND WITH CONTRAST TECHNIQUE: Multidetector CT imaging of the abdomen and pelvis was performed following the standard protocol before and following the bolus administration of intravenous contrast. RADIATION DOSE REDUCTION: This exam was performed according to the departmental dose-optimization program which includes automated exposure control, adjustment of the mA and/or kV according to patient size and/or use of iterative reconstruction technique. CONTRAST:  OMNIPAQUE  IOHEXOL  300 MG/ML  SOLN COMPARISON:  CT abdomen pelvis dated 09/23/2021. FINDINGS: Lower chest: Partially visualized small right pleural effusion with partial compressive atelectasis of the right lung base. Small right lung base calcified granuloma. There is coronary vascular calcification. No intra-abdominal free air or free fluid. Hepatobiliary: The liver is unremarkable. No biliary dilatation. The gallbladder is unremarkable Pancreas: Subcentimeter hypodense lesion in the neck of the pancreas is not characterized but may represent a side branch IPMN. This can be better evaluated with MRI on a nonemergent/outpatient basis. No dilatation of the main pancreatic duct or gland atrophy. No active inflammatory changes. Spleen: Normal in size without focal abnormality. Adrenals/Urinary Tract: The adrenal glands unremarkable. Mild bilateral renal parenchyma atrophy. There is no hydronephrosis or nephrolithiasis on either side. Small bilateral renal cysts and additional subcentimeter hypodense lesions which are too small to  characterize. There is symmetric enhancement and excretion of contrast by both kidneys. The visualized ureters and urinary bladder appear unremarkable. Stomach/Bowel: There is moderate stool throughout the colon. There is sigmoid diverticulosis without active inflammatory changes. There is no bowel obstruction or active inflammation. Appendectomy. Vascular/Lymphatic: Mild aortoiliac atherosclerotic disease. Apparent intraluminal hypodensities involving the common femoral veins bilaterally may be artifactual and related to streak artifact caused by bilateral hip arthroplasties. Duplex  ultrasound recommended if there is clinical concern for DVT. The IVC is unremarkable. No portal venous gas. There is no adenopathy. Reproductive: The prostate gland is grossly unremarkable Other: None Musculoskeletal: Osteopenia with degenerative changes of the spine. Bilateral total hip arthroplasties. No acute osseous pathology. IMPRESSION: 1. No acute intra-abdominal or pelvic pathology. 2. Sigmoid diverticulosis. No bowel obstruction. 3. Partially visualized small right pleural effusion with partial compressive atelectasis of the right lung base. 4. Artifact versus possible DVT in the common femoral veins bilaterally. Duplex ultrasound recommended if there is clinical concern for DVT. 5.  Aortic Atherosclerosis (ICD10-I70.0). Electronically Signed   By: Vanetta Chou M.D.   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 Patient was also noted to have cytogenetic abnormality of a monosomy 62 which is an average risk of multiple myeloma. Patient's M spike has ranged between 0.3 and 0.6 since January 2018. M spike today 0.5. Kappa free light chain is elevated but stable 78.2, Lambda free light chain increased at 412.6, ratio remains normal to decreased. IgA has risen slightly  to 464 (previously 449) over past year. Difficult to assess kidney function, calcium , and hemoglobin with concurrent dialysis. No recent bone scan however, as part of workup for back pain he had MR Thoracic spine performed which showed endplate irregularities at T8-T9 with possible differentials including multiple myeloma, metastatic disease, vs renal osteodystrophy. I discussed this with Dr Jacobo who advises that workup of this finding might include bone biopsy. He recommends discussion at case conference. Could consider bone biopsy at time of kyphoplasty. If negative, would recommend continued monitoring.   2. End stage renal disease- on dialysis. Unlikely related to underlying MGUS. He receives dialysis T-Th-Sa  3. 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 He is receiving iron infusions and iron infusions with dialysis. His anemia is more likely related to his kidney disease and less likely related to his MGUS.   4. Low back pain- presented to PCP, Dr Lenon. Localizes pain to lumbar spine. Xrays performed showed DDD at L1-L2 and compression fracture of T12. CT A/P showed osteopenia with degenerative changes and bilateral total hip arthroplasties. MRI thoracic spine wo contrast performed on 08/22/23 which showed acute compression fracture of T12 with buckling of the posterior superior corner of vertebral body causing spinal canal stenosis. I spoke with Dr Lenon regarding findings. He suspects patient is symptomatic of T12 fracture. He's currently taking flexeril and tramadol  with poor pain control. Might be a candidate for kyphoplasty.   Disposition:  Tumor board  6 mo - lab (cbc, cmp, k/l lc, spep, immunoglobulins) 1 year- lab (cbc, cmp, k/l lc, spep, immunoglobulins) 2 weeks later- see Dr Jacobo- la  Patient expressed understanding and was in agreement with this plan. He also understands that He can call clinic at any time with any questions, concerns, or complaints.   Tinnie KANDICE Dawn, NP   09/09/2023

## 2023-09-11 ENCOUNTER — Encounter: Payer: Self-pay | Admitting: Nurse Practitioner

## 2023-09-12 ENCOUNTER — Other Ambulatory Visit: Payer: Self-pay | Admitting: Nurse Practitioner

## 2023-09-12 ENCOUNTER — Other Ambulatory Visit

## 2023-09-12 DIAGNOSIS — D472 Monoclonal gammopathy: Secondary | ICD-10-CM

## 2023-09-12 DIAGNOSIS — M545 Low back pain, unspecified: Secondary | ICD-10-CM

## 2023-09-12 DIAGNOSIS — S22000A Wedge compression fracture of unspecified thoracic vertebra, initial encounter for closed fracture: Secondary | ICD-10-CM

## 2023-09-12 NOTE — Progress Notes (Signed)
 I spoke to patient and his wife by phone following discussion of his case at tumor board today.  Consensus is that the T12 compression fracture does not likely represent myeloma or metastatic disease and is more consistent with osteoporosis.  He does have somewhat more height loss and his compression fracture which could be causing his pain however, patient localizes to his low back.  Previous CT was reviewed which showed degenerative disc disease and recommendation was for MRI of the lumbar spine to evaluate further.  Patient is in agreement.  He would be a candidate for kyphoplasty of the T12 compression fracture if it is determined that that is the likely source of his pain. Will hold off on bone marrow biopsy or bone biopsy until after lumbar spine is performed.  Will plan to see patient a few days to a week after his imaging and he can follow-up with myself or Dr. Jacobo.

## 2023-09-14 ENCOUNTER — Encounter: Payer: Self-pay | Admitting: Nurse Practitioner

## 2023-09-17 ENCOUNTER — Ambulatory Visit
Admission: RE | Admit: 2023-09-17 | Discharge: 2023-09-17 | Disposition: A | Discharge status: Home / Self Care | Source: Ambulatory Visit | Attending: Nurse Practitioner | Admitting: Nurse Practitioner

## 2023-09-17 DIAGNOSIS — M48061 Spinal stenosis, lumbar region without neurogenic claudication: Secondary | ICD-10-CM | POA: Diagnosis not present

## 2023-09-17 DIAGNOSIS — M47816 Spondylosis without myelopathy or radiculopathy, lumbar region: Secondary | ICD-10-CM | POA: Diagnosis not present

## 2023-09-17 DIAGNOSIS — D472 Monoclonal gammopathy: Secondary | ICD-10-CM | POA: Insufficient documentation

## 2023-09-17 DIAGNOSIS — M545 Low back pain, unspecified: Secondary | ICD-10-CM | POA: Insufficient documentation

## 2023-09-17 DIAGNOSIS — S22000A Wedge compression fracture of unspecified thoracic vertebra, initial encounter for closed fracture: Secondary | ICD-10-CM | POA: Insufficient documentation

## 2023-09-17 DIAGNOSIS — R29890 Loss of height: Secondary | ICD-10-CM | POA: Diagnosis not present

## 2023-09-17 DIAGNOSIS — M47817 Spondylosis without myelopathy or radiculopathy, lumbosacral region: Secondary | ICD-10-CM | POA: Diagnosis not present

## 2023-09-17 MED ORDER — GADOBUTROL 1 MMOL/ML IV SOLN
7.5000 mL | Freq: Once | INTRAVENOUS | Status: AC | PRN
  Administered 2023-09-17: 7.5 mL via INTRAVENOUS

## 2023-09-20 ENCOUNTER — Inpatient Hospital Stay (HOSPITAL_BASED_OUTPATIENT_CLINIC_OR_DEPARTMENT_OTHER): Admitting: Nurse Practitioner

## 2023-09-20 VITALS — BP 130/64 | HR 53 | Resp 18 | Ht 70.0 in | Wt 173.0 lb

## 2023-09-20 DIAGNOSIS — D696 Thrombocytopenia, unspecified: Secondary | ICD-10-CM | POA: Insufficient documentation

## 2023-09-20 DIAGNOSIS — D472 Monoclonal gammopathy: Secondary | ICD-10-CM | POA: Diagnosis not present

## 2023-09-20 DIAGNOSIS — M545 Low back pain, unspecified: Secondary | ICD-10-CM

## 2023-09-20 DIAGNOSIS — Z992 Dependence on renal dialysis: Secondary | ICD-10-CM | POA: Insufficient documentation

## 2023-09-20 DIAGNOSIS — M810 Age-related osteoporosis without current pathological fracture: Secondary | ICD-10-CM | POA: Diagnosis not present

## 2023-09-20 DIAGNOSIS — S22000A Wedge compression fracture of unspecified thoracic vertebra, initial encounter for closed fracture: Secondary | ICD-10-CM | POA: Diagnosis not present

## 2023-09-20 DIAGNOSIS — S22080G Wedge compression fracture of T11-T12 vertebra, subsequent encounter for fracture with delayed healing: Secondary | ICD-10-CM | POA: Diagnosis not present

## 2023-09-20 DIAGNOSIS — Z7901 Long term (current) use of anticoagulants: Secondary | ICD-10-CM | POA: Insufficient documentation

## 2023-09-20 NOTE — Progress Notes (Signed)
 [facility]  Telephone: [phone] Fax: [phone]  ID: Lawrence Perkins. OB: 10/10/1940  MR#: [id number]  RDW#:[id number]  Patient Care Team: Lenon Layman ORN, MD as PCP - General (Internal Medicine) [name], MD as Consulting Physician (Oncology)  CHIEF COMPLAINT: MGUS  INTERVAL HISTORY: Patient returns to clinic for discussion of imaing results. In interim he had MRI lumbar spine for low back pain. Continue dialysis for ESRD. He's lost 2 more pounds. Back pain persists.   REVIEW OF SYSTEMS:   Review of Systems  Constitutional:  Positive for weight loss. Negative for fever and malaise/fatigue.  Respiratory: Negative.  Negative for cough and shortness of breath.   Cardiovascular: Negative.  Negative for chest pain and leg swelling.  Gastrointestinal: Negative.  Negative for abdominal pain.  Genitourinary: Negative.  Negative for dysuria.  Musculoskeletal:  Positive for back pain and myalgias.  Skin: Negative.  Negative for rash.  Neurological: Negative.  Negative for focal weakness, weakness and headaches.  Endo/Heme/Allergies:  Does not bruise/bleed easily.  Psychiatric/Behavioral: Negative.  The patient is not nervous/anxious.   As per HPI. Otherwise, a complete review of systems is negative.  PAST MEDICAL HISTORY: Past Medical History:  Diagnosis Date   Anemia    Aortic atherosclerosis (HCC)    Arthritis    Atrial fibrillation and flutter (HCC)    a.) CHA2DS2-VASc = 5 (age x 2, CHF, HTN, aortic plaque). b.) rate/rhythm maintain on oral carvedilol ; chronically anticoagulated with dose reduced apixaban    Bilateral inguinal hernia    BPH (benign prostatic hyperplasia)    Cardiac syncope    Cardiomyopathy (HCC)    a.) TTE 04/23/2016: EF 40%. b.) TTE 09/03/2016: EF 50%. c.) TTE 05/04/2019: EF >55%.   CHF (congestive heart failure) (HCC)    a.) TTE 04/23/2016: EF 40%; BAE; mild AR/TR, mod MR; G1DD. b.) TTE 09/03/2016: EF 50%; mild  LVH; BAE; triv AR/PR, mod MR/TR. c.) TTE 05/04/2019: EF >55%; mod LVH, mild LA enlargement; mild AR/MR/TR   CKD (chronic kidney disease), stage V (HCC)    Complication of anesthesia    a.) h/o intra/postoperative A.fib   Coronary artery disease    Diverticulosis    Gout    History of 2019 novel coronavirus disease (COVID-19) 11/14/2020   History of colon polyps    Hypercholesteremia    Hypertension    Long term current use of anticoagulant    a.) dose reduced apixaban ; dose reduction d/t to age and CKD Dx.   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HCC)    a.) s/p resection   MGUS (monoclonal gammopathy of unknown significance)    Pleural effusion    Rectal bleeding    Secondary hyperparathyroidism of renal origin [facility])    PAST SURGICAL HISTORY: Past Surgical History:  Procedure Laterality Date   A/V FISTULAGRAM Left 06/11/2022   Procedure: A/V Fistulagram;  Surgeon: Marea Selinda RAMAN, MD;  Location: [facility] INVASIVE CV [facility];  Service: Cardiovascular;  Laterality: Left;   A/V FISTULAGRAM Left 10/02/2022   Procedure: A/V Fistulagram;  Surgeon: Marea Selinda RAMAN, MD;  Location: [facility];  Service: Cardiovascular;  Laterality: Left;   A/V FISTULAGRAM Left 06/17/2023   Procedure: A/V Fistulagram;  Surgeon: Marea Selinda RAMAN, MD;  Location: [facility];  Service: Cardiovascular;  Laterality: Left;   ANKLE SURGERY Right 1985   following volleyball injury   APPENDECTOMY     at the of 9 yrs   AV FISTULA PLACEMENT Left 01/04/2022   Procedure:  ARTERIOVENOUS (AV) FISTULA CREATION;  Surgeon: Marea Selinda RAMAN, MD;  Location: [facility] ORS;  Service: Vascular;  Laterality: Left;   CAPD INSERTION N/A 08/10/2021   Procedure: LAPAROSCOPIC INSERTION CONTINUOUS AMBULATORY PERITONEAL DIALYSIS  (CAPD) CATHETER;  Surgeon: Jordis Laneta FALCON, MD;  Location: [facility] ORS;  Service: General;  Laterality: N/A;   CAPD REVISION N/A 09/21/2021   Procedure: LAPAROSCOPIC REVISION CONTINUOUS AMBULATORY PERITONEAL DIALYSIS  (CAPD) CATHETER;  Surgeon:  Jordis Laneta FALCON, MD;  Location: [facility] OR[facility];  Service: General;  Laterality: N/A;   CATARACT EXTRACTION Left    COLONOSCOPY     COLONOSCOPY WITH PROPOFOL  N/A 01/02/2017   Procedure: COLONOSCOPY WITH PROPOFOL ;  Surgeon: Viktoria Lamar DASEN, MD;  Location: [facility] ENDOSCOPY;  Service: Endoscopy;  Laterality: N/A;   DIALYSIS/PERMA CATHETER INSERTION N/A 03/27/2021   Procedure: DIALYSIS/PERMA CATHETER INSERTION;  Surgeon: Marea Selinda RAMAN, MD;  Location: [facility];  Service: Cardiovascular;  Laterality: N/A;   DIALYSIS/PERMA CATHETER INSERTION N/A 10/10/2021   Procedure: DIALYSIS/PERMA CATHETER INSERTION;  Surgeon: Jama Cordella MATSU, MD;  Location: [facility];  Service: Cardiovascular;  Laterality: N/A;   DIALYSIS/PERMA CATHETER REMOVAL N/A 05/08/2021   Procedure: DIALYSIS/PERMA CATHETER REMOVAL;  Surgeon: Marea Selinda RAMAN, MD;  Location: [facility];  Service: Cardiovascular;  Laterality: N/A;   DIALYSIS/PERMA CATHETER REMOVAL N/A 04/17/2022   Procedure: DIALYSIS/PERMA CATHETER REMOVAL;  Surgeon: Jama Cordella MATSU, MD;  Location: [facility];  Service: Cardiovascular;  Laterality: N/A;   INSERTION OF MESH  08/10/2021   Procedure: INSERTION OF MESH;  Surgeon: Jordis Laneta FALCON, MD;  Location: [facility] OR[facility];  Service: General;;   JOINT REPLACEMENT     RIGHT TOTAL HIP   KNEE ARTHROSCOPY WITH LATERAL RELEASE Right 04/20/2021   Procedure: Right knee medial retinaculum and lateral release with polyethylene exchange;  Surgeon: Kathlynn Sharper, MD;  Location: [facility] OR[facility];  Service: Orthopedics;  Laterality: Right;   TONSILLECTOMY     as a child   TOTAL HIP ARTHROPLASTY  09/2009   Right femoral neck fracture   TOTAL HIP ARTHROPLASTY Left 03/06/2016   Procedure: TOTAL HIP ARTHROPLASTY ANTERIOR APPROACH;  Surgeon: Sharper Kathlynn, MD;  Location: [facility];  Service: Orthopedics;  Laterality: Left;   TOTAL KNEE ARTHROPLASTY Right 03/21/2021   Procedure: TOTAL KNEE ARTHROPLASTY;  Surgeon: Kathlynn Sharper, MD;  Location: [facility] OR[facility];  Service: Orthopedics;  Laterality: Right;   UMBILICAL HERNIA REPAIR  08/10/2021   Procedure: HERNIA REPAIR UMBILICAL ADULT;  Surgeon: Jordis Laneta FALCON, MD;  Location: [facility];  Service: General;;   FAMILY HISTORY: Family History  Problem Relation Age of Onset   Cirrhosis Mother    Other Father        Lung Fibrosis   Prostate cancer Neg Hx    Kidney cancer Neg Hx    Bladder Cancer Neg Hx    ADVANCED DIRECTIVES (Y/N):  N  HEALTH MAINTENANCE: Social History   Tobacco Use   Smoking status: Former    Current packs/day: 0.00    Types: Cigarettes    Start date: 01/01/1966    Quit date: 01/01/1981    Years since quitting: 42.7    Passive exposure: Past   Smokeless tobacco: Never  Vaping Use   Vaping status: Never Used  Substance Use Topics   Alcohol use: Not Currently   Drug use: No    Colonoscopy:  Bone density:  Lipid panel:  No Known Allergies  Current Outpatient Medications  Medication Sig Dispense Refill   apixaban  (ELIQUIS )  2.5 MG TABS tablet Take 2.5 mg by mouth 2 (two) times daily.     apixaban  (ELIQUIS ) 2.5 MG TABS tablet Take 1 tablet (2.5 mg total) by mouth 2 (two) times daily. 180 tablet 3   B Complex Vitamins (B-COMPLEX/B-12 PO) Take 1,000 mcg by mouth.     calcitRIOL  (ROCALTROL ) 0.25 MCG capsule Take 0.25 mcg by mouth daily.     cholecalciferol  (VITAMIN D3) 25 MCG (1000 UNIT) tablet Take 1,000 Units by mouth daily.     cyanocobalamin  (VITAMIN B12) 1000 MCG tablet Take 2,500 mcg by mouth daily.     Multiple Vitamin (MULTIVITAMIN WITH MINERALS) TABS tablet Take 1 tablet by mouth daily. Taking a prescription multi for kidney patients     sevelamer  carbonate (RENVELA ) 800 MG tablet Take 1,600 mg by mouth 3 (three) times daily.     sevelamer  carbonate (RENVELA ) 800 MG tablet Take 2 tablets (1,600 mg total) by mouth 3 (three) times daily with meals. 180 tablet 11   torsemide  (DEMADEX ) 20 MG tablet Take 2 tablets (40 mg total) by mouth  daily. 60 tablet 0   traZODone  (DESYREL ) 50 MG tablet Take 50 mg by mouth at bedtime as needed for sleep.     carvedilol  (COREG ) 3.125 MG tablet Take 6.25 mg by mouth daily. (Patient not taking: Reported on 09/20/2023)     rosuvastatin  (CRESTOR ) 10 MG tablet Take 10 mg by mouth at bedtime. (Patient not taking: Reported on 09/20/2023)     rosuvastatin  (CRESTOR ) 5 MG tablet Take 1 tablet (5 mg total) by mouth daily. (Patient not taking: Reported on 09/20/2023) 90 tablet 3   Saccharomyces boulardii (PROBIOTIC) 250 MG CAPS Take 1 capsule by mouth in the morning and at bedtime. (Patient not taking: Reported on 09/20/2023) 12 capsule 0   No current facility-administered medications for this visit.   OBJECTIVE: Vitals:   09/20/23 1045  BP: 130/64  Pulse: (!) 53  Resp: 18  SpO2: 98%     Body mass index is 24.82 kg/m.    ECOG FS:1 - Symptomatic but completely ambulatory  General: Well-developed, well-nourished, no acute distress. Appears uncomfortable sitting in chair. Accompanied by wife.  Eyes: Pink conjunctiva, anicteric sclera. Lungs: Clear to auscultation bilaterally.  No audible wheezing or coughing Heart: Regular rate and rhythm.  Abdomen: Soft, nontender, nondistended.  Musculoskeletal: No edema, cyanosis, or clubbing. No spinal tenderness. No muscle tenderness. Ambulating w/o aids Neuro: Alert, answering all questions appropriately. Cranial nerves grossly intact. Skin: No rashes or petechiae noted. Psych: Normal affect.  LAB RESULTS: Lab Results  Component Value Date   NA 132 (L) 09/03/2023   K 3.6 09/03/2023   CL 91 (L) 09/03/2023   CO2 31 09/03/2023   GLUCOSE 102 (H) 09/03/2023   BUN 21 09/03/2023   CREATININE 3.47 (H) 09/03/2023   CALCIUM  9.1 09/03/2023   PROT 7.5 09/03/2023   ALBUMIN 4.1 09/03/2023   AST 17 09/03/2023   ALT 11 09/03/2023   ALKPHOS 121 09/03/2023   BILITOT 0.8 09/03/2023   GFRNONAA 17 (L) 09/03/2023   GFRAA 22 (L) 09/15/2019   Lab Results  Component  Value Date   WBC 6.6 09/03/2023   NEUTROABS 5.5 09/03/2023   HGB 11.6 (L) 09/03/2023   HCT 34.9 (L) 09/03/2023   MCV 104.5 (H) 09/03/2023   PLT 227 09/03/2023   Lab Results  Component Value Date   TOTALPROTELP 7.0 09/03/2023   ALBUMINELP 3.9 09/03/2023   A1GS 0.3 09/03/2023   A2GS 0.7 09/03/2023   BETS  1.1 09/03/2023   GAMS 1.0 09/03/2023   MSPIKE 0.5 (H) 09/03/2023   SPEI Comment 09/03/2023   Component Ref Range & Units (hover) 6 d ago (09/03/23) 1 yr ago (08/20/22) 2 yr ago (08/09/21) 2 yr ago (05/04/21) 2 yr ago (09/20/20) 3 yr ago (09/15/19) 4 yr ago (03/11/19)  Kappa free light chain 78.2 High  102.0 High  96.5 High  103.8 High  107.0 High  98.9 High  119.0 High   Lambda free light chains 412.6 High  331.8 High  264.4 High  213.5 High  214.6 High  150.3 High  231.5 High   Kappa, lambda light chain ratio 0.19 Low  0.31 CM 0.36 CM 0.49 CM 0.50 CM 0.66 CM 0.51 CM   Component Ref Range & Units (hover) 6 d ago (09/03/23) 1 yr ago (08/20/22) 2 yr ago (08/09/21) 2 yr ago (05/04/21) 2 yr ago (09/20/20) 3 yr ago (09/15/19) 4 yr ago (03/11/19)  IgG (Immunoglobin G), Serum 964 917 762 681 729 1,037 1,023  IgA 464 High  449 High  386 390 420 383 345  IgM (Immunoglobulin M), Srm 41 37 CM 39 CM 37 CM 40 CM 46 CM 39 CM    STUDIES: MR Lumbar Spine W Wo Contrast Result Date: 09/20/2023 EXAM: MR Lumbar Spine With and Without intravenous contrast. 09/17/2023 06:26:00 PM TECHNIQUE: Multiplanar multisequence MRI of the lumbar spine was performed with and without the administration of intravenous contrast. COMPARISON: MRI lumbar spine 03/02/2015, MRI thoracic spine 08/21/2013 and CTA chest 08/29/2013. CLINICAL HISTORY: Low back pain, increased fracture risk. LBP since working out one month ago while bending over. No surgery. FINDINGS: BONES AND ALIGNMENT: There is a compression fracture of T12 with up to 35% height loss anteriorly. The degree of height loss is increased since the prior thoracic spine MRI  but is overall similar in appearance to the recent CTA chest. There is edema within the T12 vertebral body, particularly along the inferior aspect, likely reflecting subacute compression fracture. Approximately 4 mm retropulsion at the T12 superior endplate, which is similar to the prior CT. Chronic deformity of the L1 superior endplate. Degenerative endplate changes at multiple levels with associated discogenic edema at L1-2. Enhancement within the T12 vertebral body and T11-12 disc likely reflecting reactive changes in the setting of fracture. There is no suspicious osseous lesion appreciated. SPINAL CORD: The conus medullaris extends to the T12-L1 level. SOFT TISSUES: There is mild edema within the paraspinal musculature. No paraspinal fluid collection appreciated. There are multiple cysts in the kidneys. Additional T2 hypointense lesion in the right kidney measuring up to 0.6 cm. Consider renal ultrasound for further evaluation. L1-L2: At L1-2 there is severe disc height loss, small disc bulge and posterior osteophytes resulting in mild lateral recess narrowing, mild facet arthrosis and thickening of the ligaments and flavum. No significant spinal canal stenosis. Mild foraminal stenosis on the left. L2-L3: At L2-3 there is severe disc height loss, small disc bulge and posterior osteophytes, mild lateral recess narrowing, mild facet arthrosis and thickening of the ligaments and flavum. No significant spinal canal stenosis. Mild foraminal stenosis on the right. L3-L4: At L3-4 there is moderate disc height loss, diffuse disc bulge and posterior osteophytes, mild facet arthrosis and thickening of the ligaments and flavum. No significant spinal canal stenosis. Mild foraminal stenosis on the right. L4-L5: At L4-5 there is a small disc bulge, mild facet arthrosis and thickening of the ligaments and flavum. No significant spinal canal stenosis. No significant foraminal stenosis.  L5-S1: At L5-S1 there is a small disc  bulge, mild-to-moderate facet arthrosis. No significant spinal canal stenosis. No significant foraminal stenosis. IMPRESSION: 1. Subacute T12 compression fracture with approximately 35% anterior height loss and 4 mm retropulsion. Associated mild spinal canal stenosis. No cord compression. 2. Chronic deformity of the L1 superior endplate. 3. Degenerative changes as above. Severe disc height loss at multiple levels. No high-grade spinal canal or foraminal stenosis. Electronically signed by: Donnice Mania MD 09/20/2023 11:35 AM EDT RP Workstation: [id number]   MR THORACIC SPINE WO CONTRAST Result Date: 09/02/2023 EXAM: MRI THORACIC SPINE WITHOUT INTRAVENOUS CONTRAST 08/22/2023 04:01:36 PM TECHNIQUE: Multiplanar multisequence MRI of the thoracic spine was performed without the administration of intravenous contrast. COMPARISON: CT of the chest dated 08/30/2023. CLINICAL HISTORY: Back pain x 1 week. FINDINGS: BONES AND ALIGNMENT: The thoracic vertebrae are diffusely abnormal in signal intensity with a salt and pepper appearance. There is an acute compression fracture of T12 with buckling of the posterior superior corner of the vertebral body causing mild central spinal canal stenosis. There is downward bowing of the superior endplate and mild loss of the vertebral body height. There are endplate irregularities also present at T8-9. The abnormal bone marrow signal could be secondary to multiple myeloma, metastatic disease, or renal osteodystrophy. SPINAL CORD: Normal spinal cord volume. Normal spinal cord signal. SOFT TISSUES: Numerous circumscribed foci of increased T2 signal present within the visualized ribs bilaterally. DEGENERATIVE CHANGES: There is disc space narrowing present throughout the thoracic spine. There is no significant disc herniation or spinal canal or neural foraminal stenosis. IMPRESSION: 1. Abnormal bone marrow signal in the thoracic vertebrae and ribs, with differential considerations including  multiple myeloma, metastatic disease, or renal osteodystrophy. 2. Acute compression fracture of T12 with buckling of the posterior superior corner of the vertebral body causing mild central spinal canal stenosis. Electronically signed by: Evalene Coho MD 09/02/2023 02:17 PM EDT RP Workstation: [id number]   CT Angio Chest Pulmonary Embolism (PE) W or WO Contrast Result Date: 08/30/2023 EXAM: CTA CHEST 08/30/2023 04:23:32 PM TECHNIQUE: CTA of the chest was performed after the administration of intravenous contrast. Multiplanar reformatted images are provided for review. MIP images are provided for review. Automated exposure control, iterative reconstruction, and/or weight based adjustment of the mA/kV was utilized to reduce the radiation dose to as low as reasonably achievable. COMPARISON: Cardiac CT 01/16/2023, chest CTA 04/19/2016 CLINICAL HISTORY: Abdominal pain, unspecified abdominal location, Chronic systolic CHF (congestive heart failure) FINDINGS: PULMONARY ARTERIES: Pulmonary arteries are adequately opacified. No acute pulmonary embolus. Main pulmonary artery is normal in caliber. MEDIASTINUM: The heart is mildly enlarged. There are coronary artery calcifications. Aortic atherosclerosis. No contrast in the aorta to assess for aortic pathology. No pericardial effusion. decompressed esophagus. LYMPH NODES: Calcified mediastinal and right hilar lymph nodes typical of prior granulomatous disease. No noncalcified adenopathy. LUNGS AND PLEURA: Central bronchial thickening. Minimal mucoid impaction within subsegmental lower lobe bronchi. Minimal atelectasis in the dependent left lower lobe. Calcified pleural plaques in the left hemithorax. Moderate right pleural effusion. Calcified granuloma in the right lower lobe. UPPER ABDOMEN: Contrast refluxes into the hepatic veins and IVC. SOFT TISSUES AND BONES: Mild T12 superior endplate compression fracture, assessed on recent thoracic spine MRI. Scattered schmorl's  nodes and degenerative change throughout the spine. Bilateral glenohumeral osteoarthritis. IMPRESSION: 1. No pulmonary embolism. 2. Moderate right pleural effusion. 3. 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 4. T12 supranuclear compression deformity, assessed on recent thoracic spine MRI. There  has been slight progressive loss of height since 08/21/23 abdominal CT. Electronically signed by: Andrea Gasman MD 08/30/2023 04:43 PM EDT RP Workstation: [id number]   US  Venous Img Lower Bilateral (DVT) Result Date: 08/30/2023 EXAM: ULTRASOUND DUPLEX OF THE BILATERAL LOWER EXTREMITY VEINS TECHNIQUE: Duplex ultrasound using B-mode/gray scaled imaging and Doppler spectral analysis and color flow was obtained of the deep venous structures of the bilateral lower extremity. COMPARISON: Portions included from abdominal/pelvis CT 08/21/23. CLINICAL HISTORY: Right leg swelling. Filling defect versus artifact on recent abdominal pelvic CT in the common femoral veins FINDINGS: The visualized veins of the lower extremity are patent and free of echogenic thrombus. The veins demonstrate good compressibility with normal color flow study and spectral analysis. IMPRESSION: 1. No evidence of bilateral lower extremity  DVT. Electronically signed by: Andrea Gasman MD 08/30/2023 04:27 PM EDT RP Workstation: [id number]   CT ABDOMEN PELVIS W WO CONTRAST Result Date: 08/21/2023 CLINICAL DATA:  Right lower back pain. EXAM: CT ABDOMEN AND PELVIS WITHOUT AND WITH CONTRAST TECHNIQUE: Multidetector CT imaging of the abdomen and pelvis was performed following the standard protocol before and following the bolus administration of intravenous contrast. RADIATION DOSE REDUCTION: This exam was performed according to the departmental dose-optimization program which includes automated exposure control, adjustment of the mA and/or kV according to patient size and/or use of iterative reconstruction  technique. CONTRAST:  OMNIPAQUE  IOHEXOL  300 MG/ML  SOLN COMPARISON:  CT abdomen pelvis dated 09/23/2021. FINDINGS: Lower chest: Partially visualized small right pleural effusion with partial compressive atelectasis of the right lung base. Small right lung base calcified granuloma. There is coronary vascular calcification. No intra-abdominal free air or free fluid. Hepatobiliary: The liver is unremarkable. No biliary dilatation. The gallbladder is unremarkable Pancreas: Subcentimeter hypodense lesion in the neck of the pancreas is not characterized but may represent a side branch IPMN. This can be better evaluated with MRI on a nonemergent/outpatient basis. No dilatation of the main pancreatic duct or gland atrophy. No active inflammatory changes. Spleen: Normal in size without focal abnormality. Adrenals/Urinary Tract: The adrenal glands unremarkable. Mild bilateral renal parenchyma atrophy. There is no hydronephrosis or nephrolithiasis on either side. Small bilateral renal cysts and additional subcentimeter hypodense lesions which are too small to characterize. There is symmetric enhancement and excretion of contrast by both kidneys. The visualized ureters and urinary bladder appear unremarkable. Stomach/Bowel: There is moderate stool throughout the colon. There is sigmoid diverticulosis without active inflammatory changes. There is no bowel obstruction or active inflammation. Appendectomy. Vascular/Lymphatic: Mild aortoiliac atherosclerotic disease. Apparent intraluminal hypodensities involving the common femoral veins bilaterally may be artifactual and related to streak artifact caused by bilateral hip arthroplasties. Duplex ultrasound recommended if there is clinical concern for DVT. The IVC is unremarkable. No portal venous gas. There is no adenopathy. Reproductive: The prostate gland is grossly unremarkable Other: None Musculoskeletal: Osteopenia with degenerative changes of the spine. Bilateral total  hip arthroplasties. No acute osseous pathology. IMPRESSION: 1. No acute intra-abdominal or pelvic pathology. 2. Sigmoid diverticulosis. No bowel obstruction. 3. Partially visualized small right pleural effusion with partial compressive atelectasis of the right lung base. 4. Artifact versus possible DVT in the common femoral veins bilaterally. Duplex ultrasound recommended if there is clinical concern for DVT. 5.  Aortic Atherosclerosis (ICD10-I70.0). Electronically Signed   By: Vanetta Chou M.D.   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 Patient was also noted to have cytogenetic abnormality of a monosomy 69 which is an average risk of multiple myeloma. Patient's M spike has ranged between 0.3 and 0.6 since January 2018. M spike today 0.5. Kappa free light chain is elevated but stable 78.2, Lambda free light chain increased at 412.6, ratio remains normal to decreased. IgA has risen slightly to 464 (previously 449) over past year. Difficult to assess kidney function, calcium , and hemoglobin with concurrent dialysis.  MRI thoracic spine performed for back pain revealed endplate irregularity of T8-T9 with possible differential including multiple myeloma.  Incidental T12 compression fracture.  PCP was concerned for multiple myeloma.  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  MRI lumbar spine was recommended to complete workup given low back pain.  MRI was independently reviewed today and does not show any evidence of suspicious lesions or additional compression fractures.  Discussed with patient today that this is unlikely representative of conversion to multiple myeloma and more likely related to bony changes in the setting of dialysis.  Recommend monitoring his labs and  holding off on bone marrow or additional workup at this time.  Patient in agreement.  2. End stage renal disease- on dialysis. Unlikely related to underlying MGUS. He receives dialysis T-Th-Sa  3. 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 He is receiving iron infusions and iron infusions with dialysis. His anemia is more likely related to his kidney disease and less likely related to his MGUS.   4. Low back pain- presented to PCP, Dr Lenon. Localizes pain to lumbar spine. Xrays performed showed DDD at L1-L2 and compression fracture of T12. CT A/P showed osteopenia with degenerative changes and bilateral total hip arthroplasties. MRI thoracic spine wo contrast performed on 08/22/23 which showed acute compression fracture of T12 with buckling of the posterior superior corner of vertebral body causing spinal canal stenosis.  MRI lumbar spine was performed.  Patient has seen Dr. Mavis with Ortho today who is now managing his pain and patient plans to undergo kyphoplasty of the T12 fracture.  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 He also understands that He can call clinic at any time with any questions, concerns, or complaints.   Tinnie KANDICE Dawn, NP   09/20/2023

## 2023-09-25 ENCOUNTER — Other Ambulatory Visit: Payer: Self-pay | Admitting: Neurosurgery

## 2023-09-27 ENCOUNTER — Encounter (HOSPITAL_COMMUNITY): Payer: Self-pay

## 2023-09-27 NOTE — Pre-Procedure Instructions (Signed)
 Surgical Instructions   Your procedure is scheduled on October 02, 2023. Report to [facility] Main Entrance A at 6:30 A.M., then check in with the Admitting office. Any questions or running late day of surgery: call [phone]  Questions prior to your surgery date: call [phone], Monday-Friday, 8am-4pm. If you experience any cold or flu symptoms such as cough, fever, chills, shortness of breath, etc. between now and your scheduled surgery, please notify us  at the above number.     Remember:  Do not eat or drink after midnight the night before your surgery    Take these medicines the morning of surgery with A SIP OF WATER: calcitRIOL  (ROCALTROL )  carvedilol  (COREG )  docusate sodium  (COLACE)  tamsulosin  (FLOMAX )    STOP taking your apixaban  (ELIQUIS ) three days prior to surgery. Your last dose will be September 27th.   One week prior to surgery, STOP taking any Aspirin  (unless otherwise instructed by your surgeon) Aleve, Naproxen, Ibuprofen, Motrin, Advil, Goody's, BC's, all herbal medications, fish oil, and non-prescription vitamins.                     Do NOT Smoke (Tobacco/Vaping) for 24 hours prior to your procedure.  If you use a CPAP at night, you may bring your mask/headgear for your overnight stay.   You will be asked to remove any contacts, glasses, piercing's, hearing aid's, dentures/partials prior to surgery. Please bring cases for these items if needed.    Patients discharged the day of surgery will not be allowed to drive home, and someone needs to stay with them for 24 hours.  SURGICAL WAITING ROOM VISITATION Patients may have no more than 2 support people in the waiting area - these visitors may rotate.   Pre-op  nurse will coordinate an appropriate time for 1 ADULT support person, who may not rotate, to accompany patient in pre-op .  Children under the age of 64 must have an adult with them who is not the patient and must remain in the main waiting area with  an adult.  If the patient needs to stay at the hospital during part of their recovery, the visitor guidelines for inpatient rooms apply.  Please refer to the [facility] website for the visitor guidelines for any additional information.   If you received a COVID test during your pre-op  visit  it is requested that you wear a mask when out in public, stay away from anyone that may not be feeling well and notify your surgeon if you develop symptoms. If you have been in contact with anyone that has tested positive in the last 10 days please notify you surgeon.      Pre-operative 5 CHG Bathing Instructions   You can play a key role in reducing the risk of infection after surgery. Your skin needs to be as free of germs as possible. You can reduce the number of germs on your skin by washing with CHG (chlorhexidine  gluconate) soap before surgery. CHG is an antiseptic soap that kills germs and continues to kill germs even after washing.   DO NOT use if you have an allergy to chlorhexidine /CHG or antibacterial soaps. If your skin becomes reddened or irritated, stop using the CHG and notify one of our RNs at [phone].   Please shower with the CHG soap starting 4 days before surgery using the following schedule:     Please keep in mind the following:  DO NOT shave, including legs and underarms, starting the day of  your first shower.   You may shave your face at any point before/day of surgery.  Place clean sheets on your bed the day you start using CHG soap. Use a clean washcloth (not used since being washed) for each shower. DO NOT sleep with pets once you start using the CHG.   CHG Shower Instructions:  Wash your face and private area with normal soap. If you choose to wash your hair, wash first with your normal shampoo.  After you use shampoo/soap, rinse your hair and body thoroughly to remove shampoo/soap residue.  Turn the water OFF and apply about 3 tablespoons (45 ml) of CHG soap to a  CLEAN washcloth.  Apply CHG soap ONLY FROM YOUR NECK DOWN TO YOUR TOES (washing for 3-5 minutes)  DO NOT use CHG soap on face, private areas, open wounds, or sores.  Pay special attention to the area where your surgery is being performed.  If you are having back surgery, having someone wash your back for you may be helpful. Wait 2 minutes after CHG soap is applied, then you may rinse off the CHG soap.  Pat dry with a clean towel  Put on clean clothes/pajamas   If you choose to wear lotion, please use ONLY the CHG-compatible lotions that are listed below.  Additional instructions for the day of surgery: DO NOT APPLY any lotions, deodorants, cologne, or perfumes.   Do not bring valuables to the hospital. [facility] is not responsible for any belongings/valuables. Do not wear nail polish, gel polish, artificial nails, or any other type of covering on natural nails (fingers and toes) Do not wear jewelry or makeup Put on clean/comfortable clothes.  Please brush your teeth.  Ask your nurse before applying any prescription medications to the skin.     CHG Compatible Lotions   Aveeno Moisturizing lotion  Cetaphil Moisturizing Cream  Cetaphil Moisturizing Lotion  Clairol Herbal Essence Moisturizing Lotion, Dry Skin  Clairol Herbal Essence Moisturizing Lotion, Extra Dry Skin  Clairol Herbal Essence Moisturizing Lotion, Normal Skin  Curel Age Defying Therapeutic Moisturizing Lotion with Alpha Hydroxy  Curel Extreme Care Body Lotion  Curel Soothing Hands Moisturizing Hand Lotion  Curel Therapeutic Moisturizing Cream, Fragrance-Free  Curel Therapeutic Moisturizing Lotion, Fragrance-Free  Curel Therapeutic Moisturizing Lotion, Original Formula  Eucerin Daily Replenishing Lotion  Eucerin Dry Skin Therapy Plus Alpha Hydroxy Crme  Eucerin Dry Skin Therapy Plus Alpha Hydroxy Lotion  Eucerin Original Crme  Eucerin Original Lotion  Eucerin Plus Crme Eucerin Plus Lotion  Eucerin TriLipid  Replenishing Lotion  Keri Anti-Bacterial Hand Lotion  Keri Deep Conditioning Original Lotion Dry Skin Formula Softly Scented  Keri Deep Conditioning Original Lotion, Fragrance Free Sensitive Skin Formula  Keri Lotion Fast Absorbing Fragrance Free Sensitive Skin Formula  Keri Lotion Fast Absorbing Softly Scented Dry Skin Formula  Keri Original Lotion  Keri Skin Renewal Lotion Keri Silky Smooth Lotion  Keri Silky Smooth Sensitive Skin Lotion  Nivea Body Creamy Conditioning Oil  Nivea Body Extra Enriched Lotion  Nivea Body Original Lotion  Nivea Body Sheer Moisturizing Lotion Nivea Crme  Nivea Skin Firming Lotion  NutraDerm 30 Skin Lotion  NutraDerm Skin Lotion  NutraDerm Therapeutic Skin Cream  NutraDerm Therapeutic Skin Lotion  ProShield Protective Hand Cream  Provon moisturizing lotion  Please read over the following fact sheets that you were given.

## 2023-09-30 ENCOUNTER — Encounter (HOSPITAL_COMMUNITY): Payer: Self-pay

## 2023-09-30 ENCOUNTER — Encounter (HOSPITAL_COMMUNITY)
Admission: RE | Admit: 2023-09-30 | Discharge: 2023-09-30 | Disposition: A | Source: Ambulatory Visit | Attending: Neurosurgery

## 2023-09-30 ENCOUNTER — Other Ambulatory Visit: Payer: Self-pay

## 2023-09-30 VITALS — BP 143/72 | HR 79 | Temp 97.5°F | Resp 18 | Ht 70.0 in | Wt 170.9 lb

## 2023-09-30 DIAGNOSIS — I5020 Unspecified systolic (congestive) heart failure: Secondary | ICD-10-CM | POA: Diagnosis not present

## 2023-09-30 DIAGNOSIS — N186 End stage renal disease: Secondary | ICD-10-CM | POA: Insufficient documentation

## 2023-09-30 DIAGNOSIS — I132 Hypertensive heart and chronic kidney disease with heart failure and with stage 5 chronic kidney disease, or end stage renal disease: Secondary | ICD-10-CM | POA: Diagnosis not present

## 2023-09-30 DIAGNOSIS — D631 Anemia in chronic kidney disease: Secondary | ICD-10-CM | POA: Insufficient documentation

## 2023-09-30 DIAGNOSIS — Z87891 Personal history of nicotine dependence: Secondary | ICD-10-CM | POA: Insufficient documentation

## 2023-09-30 DIAGNOSIS — E785 Hyperlipidemia, unspecified: Secondary | ICD-10-CM | POA: Diagnosis not present

## 2023-09-30 DIAGNOSIS — I083 Combined rheumatic disorders of mitral, aortic and tricuspid valves: Secondary | ICD-10-CM | POA: Insufficient documentation

## 2023-09-30 DIAGNOSIS — Z7901 Long term (current) use of anticoagulants: Secondary | ICD-10-CM | POA: Insufficient documentation

## 2023-09-30 DIAGNOSIS — Z01818 Encounter for other preprocedural examination: Secondary | ICD-10-CM | POA: Insufficient documentation

## 2023-09-30 DIAGNOSIS — I4891 Unspecified atrial fibrillation: Secondary | ICD-10-CM | POA: Insufficient documentation

## 2023-09-30 DIAGNOSIS — Z992 Dependence on renal dialysis: Secondary | ICD-10-CM | POA: Diagnosis not present

## 2023-09-30 LAB — BASIC METABOLIC PANEL WITH GFR
Anion gap: 12 (ref 5–15)
BUN: 29 mg/dL — ABNORMAL HIGH (ref 8–23)
CO2: 30 mmol/L (ref 22–32)
Calcium: 9.6 mg/dL (ref 8.9–10.3)
Chloride: 95 mmol/L — ABNORMAL LOW (ref 98–111)
Creatinine, Ser: 4.23 mg/dL — ABNORMAL HIGH (ref 0.61–1.24)
GFR, Estimated: 13 mL/min — ABNORMAL LOW (ref >60)
Glucose, Bld: 105 mg/dL — ABNORMAL HIGH (ref 70–99)
Potassium: 3.9 mmol/L (ref 3.5–5.1)
Sodium: 137 mmol/L (ref 135–145)

## 2023-09-30 LAB — SURGICAL PCR SCREEN
MRSA, PCR: NEGATIVE
Staphylococcus aureus: NEGATIVE

## 2023-09-30 LAB — CBC
HCT: 33.2 % — ABNORMAL LOW (ref 39.0–52.0)
Hemoglobin: 10.9 g/dL — ABNORMAL LOW (ref 13.0–17.0)
MCH: 34.6 pg — ABNORMAL HIGH (ref 26.0–34.0)
MCHC: 32.8 g/dL (ref 30.0–36.0)
MCV: 105.4 fL — ABNORMAL HIGH (ref 80.0–100.0)
Platelets: 208 K/uL (ref 150–400)
RBC: 3.15 MIL/uL — ABNORMAL LOW (ref 4.22–5.81)
RDW: 13.6 % (ref 11.5–15.5)
WBC: 5.3 K/uL (ref 4.0–10.5)
nRBC: 0 % (ref 0.0–0.2)

## 2023-09-30 NOTE — Progress Notes (Signed)
 PCP - Lenon Layman ORN, MD (LOV 08-19-23) Cardiologist - Cara Marinda Lovelace, MD (LOV 01-14-23) Oncology -[name], MD   PPM/ICD - denies Device Orders - n/a Rep Notified - n/a  Chest x-ray - 01-31-22 EKG - requested tracing Stress Test - 05-04-19 (CE)  ECHO - 10-11-21 Cardiac Cath - denies  Sleep Study - denies CPAP - n/a  DM -denies  Blood Thinner Instructions:apixaban  (ELIQUIS ) 09-28-23 Aspirin  Instructions:denies  ERAS Protcol - NPO  COVID TEST- n/a  Anesthesia review: Yes, Hx of HTN, CAD, CKD (dialysis), chf, afib  Patient denies shortness of breath, fever, cough and chest pain at PAT appointment   All instructions explained to the patient, with a verbal understanding of the material. Patient agrees to go over the instructions while at home for a better understanding. Patient also instructed to self quarantine after being tested for COVID-19. The opportunity to ask questions was provided.

## 2023-10-01 DIAGNOSIS — Z992 Dependence on renal dialysis: Secondary | ICD-10-CM | POA: Diagnosis not present

## 2023-10-01 DIAGNOSIS — N186 End stage renal disease: Secondary | ICD-10-CM | POA: Diagnosis not present

## 2023-10-01 NOTE — Progress Notes (Signed)
 Anesthesia Chart Review:  83 year old male follows with cardiology for history of A-fib on Eliquis , HTN, HLD, systolic heart failure with recovered EF (LVEF 60 to 65% by echo 10/2021).  Exercise nuclear stress test 05/2019 showed normal perfusion.  Echo 10/2021 showed LVEF 65%, normal RV systolic function, moderately elevated PASP 47.4 mmHg, mild mitral regurgitation, mild to moderate tricuspid regurgitation, mild to moderate aortic insufficiency.  Last seen in follow-up by Dr. Florencio on 01/14/2023 and noted to be stable from cardiac standpoint, current medications continued, 1 year follow-up recommended.  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  Patient reports last dose of Eliquis  09/28/2023.  Preop labs reviewed, creatinine 4.23 consistent with history of ESRD, mild chronic anemia with hemoglobin 10.9, otherwise unremarkable.  EKG 01/14/2023 (Care Everywhere, tracing requested): Atrial fibrillation with slow ventricular response/atrial flutter.  Rate 54.  Nonspecific T wave abnormalities.  Prolonged QT (QTc 476).  TTE 10/11/21: 1. Left ventricular ejection fraction, by estimation, is 60 to 65%. The  left ventricle has normal function. The left ventricle has no regional  wall motion abnormalities. There is mild left ventricular hypertrophy.  Left ventricular diastolic parameters  are indeterminate.   2. Right ventricular systolic function is normal. The right ventricular  size is mildly enlarged. There is moderately elevated pulmonary artery  systolic pressure. The estimated right ventricular systolic pressure is  47.4 mmHg.   3. Left atrial size was severely dilated.   4. Right atrial size was mildly dilated.   5. The mitral valve is grossly normal. Mild mitral valve regurgitation.   6. Tricuspid valve regurgitation is mild to moderate.   7. The aortic valve is tricuspid. Aortic valve  regurgitation is mild to  moderate. Aortic valve sclerosis/calcification is present, without any  evidence of aortic stenosis.   8. The inferior vena cava is dilated in size with >50% respiratory  variability, suggesting right atrial pressure of 8 mmHg.   Exercise nuclear stress test 05/04/19 (care everywhere): Treadmill EKG without evidence of ischemia or arrhythmia  Normal myocardial perfusion without evidence of myocardial ischemia       Lynwood Geofm RIGGERS [facility] Short Stay Center/Anesthesiology Phone [phone] 10/01/2023 9:28 AM

## 2023-10-01 NOTE — Anesthesia Preprocedure Evaluation (Signed)
 Anesthesia Evaluation  Patient identified by MRN, date of birth, ID band Patient awake    Reviewed: Allergy & Precautions, NPO status , Patient's Chart, lab work & pertinent test results  Airway Mallampati: II  TM Distance: >3 FB Neck ROM: Full    Dental   Pulmonary former smoker   breath sounds clear to auscultation       Cardiovascular hypertension, Pt. on medications and Pt. on home beta blockers + CAD and +CHF  + dysrhythmias Atrial Fibrillation + Valvular Problems/Murmurs AI  Rhythm:Regular Rate:Normal     Neuro/Psych negative neurological ROS     GI/Hepatic negative GI ROS, Neg liver ROS,,,  Endo/Other  negative endocrine ROS    Renal/GU ESRF and DialysisRenal disease     Musculoskeletal  (+) Arthritis ,    Abdominal   Peds  Hematology  (+) Blood dyscrasia, anemia   Anesthesia Other Findings   Reproductive/Obstetrics                              Anesthesia Physical Anesthesia Plan  ASA: 4  Anesthesia Plan: General   Post-op Pain Management: Tylenol  PO (pre-op )*   Induction: Intravenous  PONV Risk Score and Plan: 2 and Dexamethasone , Ondansetron  and Treatment may vary due to age or medical condition  Airway Management Planned: Oral ETT  Additional Equipment:   Intra-op Plan:   Post-operative Plan: Extubation in OR  Informed Consent: I have reviewed the patients History and Physical, chart, labs and discussed the procedure including the risks, benefits and alternatives for the proposed anesthesia with the patient or authorized representative who has indicated his/her understanding and acceptance.     Dental advisory given  Plan Discussed with: CRNA  Anesthesia Plan Comments: (PAT note by Lynwood Hope, PA-C:  83 year old male follows with cardiology for history of A-fib on Eliquis , HTN, HLD, systolic heart failure with recovered EF (LVEF 60 to 65% by echo 10/2021).   Exercise nuclear stress test 05/2019 showed normal perfusion.  Echo 10/2021 showed LVEF 65%, normal RV systolic function, moderately elevated PASP 47.4 mmHg, mild mitral regurgitation, mild to moderate tricuspid regurgitation, mild to moderate aortic insufficiency.  Last seen in follow-up by Dr. Florencio on 01/14/2023 and noted to be stable from cardiac standpoint, current medications continued, 1 year follow-up recommended.  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  Patient reports last dose of Eliquis  09/28/2023.  Preop labs reviewed, creatinine 4.23 consistent with history of ESRD, mild chronic anemia with hemoglobin 10.9, otherwise unremarkable.  EKG 01/14/2023 (Care Everywhere, tracing requested): Atrial fibrillation with slow ventricular response/atrial flutter.  Rate 54.  Nonspecific T wave abnormalities.  Prolonged QT (QTc 476).  TTE 10/11/21: 1. Left ventricular ejection fraction, by estimation, is 60 to 65%. The  left ventricle has normal function. The left ventricle has no regional  wall motion abnormalities. There is mild left ventricular hypertrophy.  Left ventricular diastolic parameters  are indeterminate.  2. Right ventricular systolic function is normal. The right ventricular  size is mildly enlarged. There is moderately elevated pulmonary artery  systolic pressure. The estimated right ventricular systolic pressure is  47.4 mmHg.  3. Left atrial size was severely dilated.  4. Right atrial size was mildly dilated.  5. The mitral valve is grossly normal. Mild mitral valve regurgitation.  6. Tricuspid valve regurgitation is mild to moderate.  7. The aortic valve  is tricuspid. Aortic valve regurgitation is mild to  moderate. Aortic valve sclerosis/calcification is present, without any  evidence of aortic stenosis.  8. The inferior vena cava is dilated in size with >50%  respiratory  variability, suggesting right atrial pressure of 8 mmHg.   Exercise nuclear stress test 05/04/19 (care everywhere): Treadmill EKG without evidence of ischemia or arrhythmia  Normal myocardial perfusion without evidence of myocardial ischemia   )         Anesthesia Quick Evaluation

## 2023-10-02 ENCOUNTER — Encounter (HOSPITAL_COMMUNITY)
Admission: RE | Disposition: A | Discharge status: Home / Self Care | Payer: Self-pay | Source: Home / Self Care | Attending: Neurosurgery

## 2023-10-02 ENCOUNTER — Other Ambulatory Visit: Payer: Self-pay

## 2023-10-02 ENCOUNTER — Ambulatory Visit (HOSPITAL_BASED_OUTPATIENT_CLINIC_OR_DEPARTMENT_OTHER): Payer: Self-pay | Admitting: Certified Registered Nurse Anesthetist

## 2023-10-02 ENCOUNTER — Ambulatory Visit (HOSPITAL_COMMUNITY)

## 2023-10-02 ENCOUNTER — Ambulatory Visit (HOSPITAL_COMMUNITY)
Admission: RE | Admit: 2023-10-02 | Discharge: 2023-10-02 | Disposition: A | Discharge status: Home / Self Care | Attending: Neurosurgery | Admitting: Neurosurgery

## 2023-10-02 ENCOUNTER — Encounter (HOSPITAL_COMMUNITY): Payer: Self-pay | Admitting: Neurosurgery

## 2023-10-02 ENCOUNTER — Ambulatory Visit (HOSPITAL_COMMUNITY): Payer: Self-pay | Admitting: Physician Assistant

## 2023-10-02 DIAGNOSIS — M1990 Unspecified osteoarthritis, unspecified site: Secondary | ICD-10-CM | POA: Diagnosis not present

## 2023-10-02 DIAGNOSIS — Z87891 Personal history of nicotine dependence: Secondary | ICD-10-CM | POA: Insufficient documentation

## 2023-10-02 DIAGNOSIS — D631 Anemia in chronic kidney disease: Secondary | ICD-10-CM | POA: Diagnosis not present

## 2023-10-02 DIAGNOSIS — I2510 Atherosclerotic heart disease of native coronary artery without angina pectoris: Secondary | ICD-10-CM | POA: Insufficient documentation

## 2023-10-02 DIAGNOSIS — N186 End stage renal disease: Secondary | ICD-10-CM | POA: Insufficient documentation

## 2023-10-02 DIAGNOSIS — I4430 Unspecified atrioventricular block: Secondary | ICD-10-CM | POA: Diagnosis not present

## 2023-10-02 DIAGNOSIS — Z992 Dependence on renal dialysis: Secondary | ICD-10-CM | POA: Diagnosis not present

## 2023-10-02 DIAGNOSIS — I132 Hypertensive heart and chronic kidney disease with heart failure and with stage 5 chronic kidney disease, or end stage renal disease: Secondary | ICD-10-CM | POA: Diagnosis not present

## 2023-10-02 DIAGNOSIS — M8008XA Age-related osteoporosis with current pathological fracture, vertebra(e), initial encounter for fracture: Secondary | ICD-10-CM | POA: Diagnosis not present

## 2023-10-02 DIAGNOSIS — Z7901 Long term (current) use of anticoagulants: Secondary | ICD-10-CM | POA: Insufficient documentation

## 2023-10-02 DIAGNOSIS — I509 Heart failure, unspecified: Secondary | ICD-10-CM | POA: Diagnosis not present

## 2023-10-02 DIAGNOSIS — I429 Cardiomyopathy, unspecified: Secondary | ICD-10-CM | POA: Insufficient documentation

## 2023-10-02 DIAGNOSIS — I10 Essential (primary) hypertension: Secondary | ICD-10-CM | POA: Diagnosis not present

## 2023-10-02 DIAGNOSIS — I4892 Unspecified atrial flutter: Secondary | ICD-10-CM | POA: Diagnosis not present

## 2023-10-02 DIAGNOSIS — M8008XG Age-related osteoporosis with current pathological fracture, vertebra(e), subsequent encounter for fracture with delayed healing: Secondary | ICD-10-CM

## 2023-10-02 DIAGNOSIS — I4891 Unspecified atrial fibrillation: Secondary | ICD-10-CM | POA: Insufficient documentation

## 2023-10-02 DIAGNOSIS — Z79899 Other long term (current) drug therapy: Secondary | ICD-10-CM | POA: Diagnosis not present

## 2023-10-02 DIAGNOSIS — Z23 Encounter for immunization: Secondary | ICD-10-CM | POA: Diagnosis not present

## 2023-10-02 DIAGNOSIS — D509 Iron deficiency anemia, unspecified: Secondary | ICD-10-CM | POA: Diagnosis not present

## 2023-10-02 DIAGNOSIS — S22000G Wedge compression fracture of unspecified thoracic vertebra, subsequent encounter for fracture with delayed healing: Secondary | ICD-10-CM

## 2023-10-02 DIAGNOSIS — Z0389 Encounter for observation for other suspected diseases and conditions ruled out: Secondary | ICD-10-CM | POA: Diagnosis not present

## 2023-10-02 HISTORY — PX: KYPHOPLASTY: SHX5884

## 2023-10-02 LAB — POCT I-STAT, CHEM 8
BUN: 22 mg/dL (ref 8–23)
Calcium, Ion: 1.18 mmol/L (ref 1.15–1.40)
Chloride: 98 mmol/L (ref 98–111)
Creatinine, Ser: 3.6 mg/dL — ABNORMAL HIGH (ref 0.61–1.24)
Glucose, Bld: 104 mg/dL — ABNORMAL HIGH (ref 70–99)
HCT: 33 % — ABNORMAL LOW (ref 39.0–52.0)
Hemoglobin: 11.2 g/dL — ABNORMAL LOW (ref 13.0–17.0)
Potassium: 3.5 mmol/L (ref 3.5–5.1)
Sodium: 141 mmol/L (ref 135–145)
TCO2: 28 mmol/L (ref 22–32)

## 2023-10-02 SURGERY — KYPHOPLASTY
Anesthesia: General

## 2023-10-02 MED ORDER — OXYCODONE HCL 5 MG PO TABS: 5.0000 mg | ORAL_TABLET | Freq: Once | ORAL | Status: DC | PRN

## 2023-10-02 MED ORDER — FENTANYL CITRATE (PF) 250 MCG/5ML IJ SOLN
INTRAMUSCULAR | Status: AC
  Filled 2023-10-02: qty 5

## 2023-10-02 MED ORDER — SODIUM CHLORIDE 0.9% FLUSH: 3.0000 mL | Freq: Two times a day (BID) | INTRAVENOUS | Status: DC

## 2023-10-02 MED ORDER — CHLORHEXIDINE GLUCONATE 0.12 % MT SOLN
15.0000 mL | Freq: Once | OROMUCOSAL | Status: AC
  Administered 2023-10-02: 15 mL via OROMUCOSAL
  Filled 2023-10-02: qty 15

## 2023-10-02 MED ORDER — ROSUVASTATIN CALCIUM 5 MG PO TABS: 10.0000 mg | ORAL_TABLET | Freq: Every day | ORAL | Status: DC

## 2023-10-02 MED ORDER — LIDOCAINE 2% (20 MG/ML) 5 ML SYRINGE
INTRAMUSCULAR | Status: AC
  Filled 2023-10-02: qty 5

## 2023-10-02 MED ORDER — CALCITRIOL 0.25 MCG PO CAPS
0.2500 ug | ORAL_CAPSULE | Freq: Every day | ORAL | Status: DC
  Filled 2023-10-02: qty 1

## 2023-10-02 MED ORDER — ORAL CARE MOUTH RINSE: 15.0000 mL | Freq: Once | OROMUCOSAL | Status: AC

## 2023-10-02 MED ORDER — ACETAMINOPHEN 650 MG RE SUPP: 650.0000 mg | RECTAL | Status: DC | PRN

## 2023-10-02 MED ORDER — CEFAZOLIN SODIUM-DEXTROSE 2-4 GM/100ML-% IV SOLN
2.0000 g | Freq: Three times a day (TID) | INTRAVENOUS | Status: DC
  Administered 2023-10-02: 2 g via INTRAVENOUS
  Filled 2023-10-02: qty 100

## 2023-10-02 MED ORDER — SODIUM CHLORIDE 0.9% FLUSH: 3.0000 mL | INTRAVENOUS | Status: DC | PRN

## 2023-10-02 MED ORDER — ONDANSETRON HCL 4 MG PO TABS: 4.0000 mg | ORAL_TABLET | Freq: Four times a day (QID) | ORAL | Status: DC | PRN

## 2023-10-02 MED ORDER — PROPOFOL 10 MG/ML IV BOLUS
INTRAVENOUS | Status: AC
Start: 2023-10-02 — End: 2023-10-02
  Filled 2023-10-02: qty 20

## 2023-10-02 MED ORDER — ROCURONIUM BROMIDE 10 MG/ML (PF) SYRINGE
PREFILLED_SYRINGE | INTRAVENOUS | Status: DC | PRN
  Administered 2023-10-02: 50 mg via INTRAVENOUS

## 2023-10-02 MED ORDER — DEXAMETHASONE SODIUM PHOSPHATE 10 MG/ML IJ SOLN
INTRAMUSCULAR | Status: DC | PRN
  Administered 2023-10-02: 10 mg via INTRAVENOUS

## 2023-10-02 MED ORDER — MENTHOL 3 MG MT LOZG: 1.0000 | LOZENGE | OROMUCOSAL | Status: DC | PRN

## 2023-10-02 MED ORDER — FENTANYL CITRATE (PF) 100 MCG/2ML IJ SOLN: 25.0000 ug | INTRAMUSCULAR | Status: DC | PRN

## 2023-10-02 MED ORDER — PHENOL 1.4 % MT LIQD: 1.0000 | OROMUCOSAL | Status: DC | PRN

## 2023-10-02 MED ORDER — MORPHINE SULFATE (PF) 2 MG/ML IV SOLN: 2.0000 mg | INTRAVENOUS | Status: DC | PRN

## 2023-10-02 MED ORDER — DOCUSATE SODIUM 100 MG PO CAPS: 100.0000 mg | ORAL_CAPSULE | Freq: Two times a day (BID) | ORAL | Status: DC

## 2023-10-02 MED ORDER — CYCLOBENZAPRINE HCL 10 MG PO TABS: 10.0000 mg | ORAL_TABLET | Freq: Three times a day (TID) | ORAL | Status: DC | PRN

## 2023-10-02 MED ORDER — BACITRACIN ZINC 500 UNIT/GM EX OINT
TOPICAL_OINTMENT | CUTANEOUS | Status: DC | PRN
  Administered 2023-10-02: 1 via TOPICAL

## 2023-10-02 MED ORDER — CARVEDILOL 6.25 MG PO TABS: 6.2500 mg | ORAL_TABLET | Freq: Two times a day (BID) | ORAL | Status: DC

## 2023-10-02 MED ORDER — TAMSULOSIN HCL 0.4 MG PO CAPS
0.4000 mg | ORAL_CAPSULE | Freq: Every day | ORAL | Status: DC
  Filled 2023-10-02: qty 1

## 2023-10-02 MED ORDER — IOPAMIDOL (ISOVUE-300) INJECTION 61%
INTRAVENOUS | Status: DC | PRN
  Administered 2023-10-02: 100 mL

## 2023-10-02 MED ORDER — DEXAMETHASONE SODIUM PHOSPHATE 10 MG/ML IJ SOLN
INTRAMUSCULAR | Status: AC
  Filled 2023-10-02: qty 1

## 2023-10-02 MED ORDER — ROCURONIUM BROMIDE 10 MG/ML (PF) SYRINGE
PREFILLED_SYRINGE | INTRAVENOUS | Status: AC
  Filled 2023-10-02: qty 10

## 2023-10-02 MED ORDER — TORSEMIDE 20 MG PO TABS: 20.0000 mg | ORAL_TABLET | Freq: Two times a day (BID) | ORAL | Status: DC

## 2023-10-02 MED ORDER — OXYCODONE HCL 5 MG PO TABS: 5.0000 mg | ORAL_TABLET | ORAL | Status: DC | PRN

## 2023-10-02 MED ORDER — ONDANSETRON HCL 4 MG/2ML IJ SOLN
INTRAMUSCULAR | Status: AC
Start: 2023-10-02 — End: 2023-10-02
  Filled 2023-10-02: qty 2

## 2023-10-02 MED ORDER — SODIUM CHLORIDE 0.9 % IV SOLN: 250.0000 mL | INTRAVENOUS | Status: DC

## 2023-10-02 MED ORDER — ACETAMINOPHEN 500 MG PO TABS: 1000.0000 mg | ORAL_TABLET | Freq: Once | ORAL | Status: DC

## 2023-10-02 MED ORDER — BACITRACIN ZINC 500 UNIT/GM EX OINT
TOPICAL_OINTMENT | CUTANEOUS | Status: AC
  Filled 2023-10-02: qty 28.35

## 2023-10-02 MED ORDER — OXYCODONE HCL 5 MG PO TABS: 10.0000 mg | ORAL_TABLET | ORAL | Status: DC | PRN

## 2023-10-02 MED ORDER — PROPOFOL 10 MG/ML IV BOLUS
INTRAVENOUS | Status: DC | PRN
  Administered 2023-10-02: 100 mg via INTRAVENOUS

## 2023-10-02 MED ORDER — ONDANSETRON HCL 4 MG/2ML IJ SOLN
INTRAMUSCULAR | Status: DC | PRN
  Administered 2023-10-02: 4 mg via INTRAVENOUS

## 2023-10-02 MED ORDER — CARVEDILOL 3.125 MG PO TABS
3.1250 mg | ORAL_TABLET | Freq: Once | ORAL | Status: AC
  Administered 2023-10-02: 3.125 mg via ORAL
  Filled 2023-10-02: qty 1

## 2023-10-02 MED ORDER — 0.9 % SODIUM CHLORIDE (POUR BTL) OPTIME
TOPICAL | Status: DC | PRN
  Administered 2023-10-02: 1000 mL

## 2023-10-02 MED ORDER — BUPIVACAINE-EPINEPHRINE 0.5% -1:200000 IJ SOLN
INTRAMUSCULAR | Status: DC | PRN
  Administered 2023-10-02: 6 mL

## 2023-10-02 MED ORDER — CHLORHEXIDINE GLUCONATE CLOTH 2 % EX PADS: 6.0000 | MEDICATED_PAD | Freq: Once | CUTANEOUS | Status: DC

## 2023-10-02 MED ORDER — SODIUM CHLORIDE 0.9 % IV SOLN: INTRAVENOUS | Status: DC | PRN

## 2023-10-02 MED ORDER — OXYCODONE HCL 5 MG/5ML PO SOLN: 5.0000 mg | Freq: Once | ORAL | Status: DC | PRN

## 2023-10-02 MED ORDER — SUGAMMADEX SODIUM 200 MG/2ML IV SOLN
INTRAVENOUS | Status: DC | PRN
  Administered 2023-10-02: 310 mg via INTRAVENOUS

## 2023-10-02 MED ORDER — ACETAMINOPHEN 325 MG PO TABS: 650.0000 mg | ORAL_TABLET | ORAL | Status: DC | PRN

## 2023-10-02 MED ORDER — LACTATED RINGERS IV SOLN: INTRAVENOUS | Status: DC

## 2023-10-02 MED ORDER — OXYCODONE-ACETAMINOPHEN 5-325 MG PO TABS: 1.0000 | ORAL_TABLET | Freq: Four times a day (QID) | ORAL | 0 refills | Status: AC | PRN

## 2023-10-02 MED ORDER — FENTANYL CITRATE (PF) 250 MCG/5ML IJ SOLN
INTRAMUSCULAR | Status: DC | PRN
  Administered 2023-10-02: 50 ug via INTRAVENOUS
  Administered 2023-10-02: 100 ug via INTRAVENOUS

## 2023-10-02 MED ORDER — ONDANSETRON HCL 4 MG/2ML IJ SOLN
4.0000 mg | Freq: Four times a day (QID) | INTRAMUSCULAR | Status: DC | PRN
  Administered 2023-10-02: 4 mg via INTRAVENOUS
  Filled 2023-10-02: qty 2

## 2023-10-02 MED ORDER — PHENYLEPHRINE HCL-NACL 20-0.9 MG/250ML-% IV SOLN
INTRAVENOUS | Status: DC | PRN
  Administered 2023-10-02: 30 ug/min via INTRAVENOUS

## 2023-10-02 MED ORDER — LIDOCAINE 2% (20 MG/ML) 5 ML SYRINGE
INTRAMUSCULAR | Status: DC | PRN
  Administered 2023-10-02: 20 mg via INTRAVENOUS

## 2023-10-02 MED ORDER — BISACODYL 10 MG RE SUPP
10.0000 mg | Freq: Every day | RECTAL | Status: DC | PRN
  Filled 2023-10-02: qty 1

## 2023-10-02 MED ORDER — ACETAMINOPHEN 500 MG PO TABS
1000.0000 mg | ORAL_TABLET | Freq: Four times a day (QID) | ORAL | Status: DC
  Filled 2023-10-02: qty 2

## 2023-10-02 MED ORDER — BUPIVACAINE-EPINEPHRINE (PF) 0.5% -1:200000 IJ SOLN
INTRAMUSCULAR | Status: AC
  Filled 2023-10-02: qty 30

## 2023-10-02 MED ORDER — ACETAMINOPHEN 10 MG/ML IV SOLN
INTRAVENOUS | Status: AC
  Filled 2023-10-02: qty 100

## 2023-10-02 MED ORDER — CEFAZOLIN SODIUM-DEXTROSE 2-4 GM/100ML-% IV SOLN
2.0000 g | INTRAVENOUS | Status: AC
  Administered 2023-10-02: 2 g via INTRAVENOUS
  Filled 2023-10-02: qty 100

## 2023-10-02 MED ORDER — SODIUM CHLORIDE 0.9 % IV SOLN: 12.5000 mg | INTRAVENOUS | Status: DC | PRN

## 2023-10-02 SURGICAL SUPPLY — 39 items
BAG COUNTER SPONGE SURGICOUNT (BAG) ×1 IMPLANT
BENZOIN TINCTURE PRP APPL 2/3 (GAUZE/BANDAGES/DRESSINGS) ×1 IMPLANT
BLADE CLIPPER SURG (BLADE) IMPLANT
BLADE SURG 15 STRL LF DISP TIS (BLADE) ×1 IMPLANT
CEMENT KYPHON C01A KIT/MIXER (Cement) IMPLANT
DEVICE BIOPSY BONE KYPHX (INSTRUMENTS) IMPLANT
DRAPE C-ARM 42X72 X-RAY (DRAPES) ×1 IMPLANT
DRAPE HALF SHEET 40X57 (DRAPES) ×1 IMPLANT
DRAPE INCISE IOBAN 66X45 STRL (DRAPES) ×1 IMPLANT
DRAPE LAPAROTOMY 100X72X124 (DRAPES) ×1 IMPLANT
DRAPE SURG 17X23 STRL (DRAPES) ×4 IMPLANT
DRAPE WARM FLUID 44X44 (DRAPES) ×1 IMPLANT
DRSG OPSITE POSTOP 4X6 (GAUZE/BANDAGES/DRESSINGS) IMPLANT
GAUZE 4X4 16PLY ~~LOC~~+RFID DBL (SPONGE) ×1 IMPLANT
GAUZE SPONGE 4X4 12PLY STRL (GAUZE/BANDAGES/DRESSINGS) ×1 IMPLANT
GLOVE BIO SURGEON STRL SZ7 (GLOVE) IMPLANT
GLOVE BIO SURGEON STRL SZ8 (GLOVE) ×1 IMPLANT
GLOVE BIO SURGEON STRL SZ8.5 (GLOVE) ×1 IMPLANT
GLOVE BIOGEL PI IND STRL 7.5 (GLOVE) IMPLANT
GLOVE EXAM NITRILE XL STR (GLOVE) IMPLANT
GOWN STRL REUS W/ TWL LRG LVL3 (GOWN DISPOSABLE) IMPLANT
GOWN STRL REUS W/ TWL XL LVL3 (GOWN DISPOSABLE) IMPLANT
INTRODUCER DEVICE OSTEO LEVEL (INTRODUCER) IMPLANT
KIT BASIN OR (CUSTOM PROCEDURE TRAY) ×1 IMPLANT
KIT POSITIONER JACKSON TABLE (MISCELLANEOUS) ×1 IMPLANT
KIT TURNOVER KIT B (KITS) ×1 IMPLANT
NDL HYPO 22X1.5 SAFETY MO (MISCELLANEOUS) ×1 IMPLANT
NEEDLE HYPO 22X1.5 SAFETY MO (MISCELLANEOUS) ×1 IMPLANT
PACK EENT II TURBAN DRAPE (CUSTOM PROCEDURE TRAY) ×1 IMPLANT
PAD ARMBOARD POSITIONER FOAM (MISCELLANEOUS) ×3 IMPLANT
SOLN 0.9% NACL 1000 ML (IV SOLUTION) ×1 IMPLANT
SOLN 0.9% NACL POUR BTL 1000ML (IV SOLUTION) ×1 IMPLANT
SPECIMEN JAR SMALL (MISCELLANEOUS) IMPLANT
STRIP CLOSURE SKIN 1/2X4 (GAUZE/BANDAGES/DRESSINGS) ×1 IMPLANT
SUT VIC AB 3-0 SH 8-18 (SUTURE) ×1 IMPLANT
SYR CONTROL 10ML LL (SYRINGE) ×1 IMPLANT
TOWEL GREEN STERILE (TOWEL DISPOSABLE) ×1 IMPLANT
TOWEL GREEN STERILE FF (TOWEL DISPOSABLE) ×1 IMPLANT
TRAY KYPHOPAK 15/3 ONESTEP 1ST (MISCELLANEOUS) IMPLANT

## 2023-10-02 NOTE — H&P (Signed)
 Subjective: The patient is an 83 year old white male who suffered a T12 compression fracture.  He failed medical management.  We discussed the various treatment options.  He has decided proceed with a kyphoplasty.  Past Medical History:  Diagnosis Date   Anemia    Aortic atherosclerosis    Arthritis    Atrial fibrillation and flutter (HCC)    a.) CHA2DS2-VASc = 5 (age x 2, CHF, HTN, aortic plaque). b.) rate/rhythm maintain on oral carvedilol ; chronically anticoagulated with dose reduced apixaban    Bilateral inguinal hernia    BPH (benign prostatic hyperplasia)    Cardiac syncope    Cardiomyopathy (HCC)    a.) TTE 04/23/2016: EF 40%. b.) TTE 09/03/2016: EF 50%. c.) TTE 05/04/2019: EF >55%.   CHF (congestive heart failure) (HCC)    a.) TTE 04/23/2016: EF 40%; BAE; mild AR/TR, mod MR; G1DD. b.) TTE 09/03/2016: EF 50%; mild LVH; BAE; triv AR/PR, mod MR/TR. 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[facility])    a.) s/p resection   MGUS (monoclonal gammopathy of unknown significance)    Pleural effusion    Rectal bleeding    Secondary hyperparathyroidism of renal origin     Past Surgical History:  Procedure Laterality Date   A/V FISTULAGRAM Left 06/11/2022   Procedure: A/V Fistulagram;  Surgeon: Marea Selinda RAMAN, MD;  Location: [facility] INVASIVE CV LAB;  Service: Cardiovascular;  Laterality: Left;   A/V FISTULAGRAM Left 10/02/2022   Procedure: A/V Fistulagram;  Surgeon: Marea Selinda RAMAN, MD;  Location: [facility]  INVASIVE CV LAB;  Service: Cardiovascular;  Laterality: Left;   A/V FISTULAGRAM Left 06/17/2023   Procedure: A/V Fistulagram;  Surgeon: Marea Selinda RAMAN, MD;  Location: [facility];  Service: Cardiovascular;  Laterality: Left;   ANKLE SURGERY Right 1985   following volleyball injury   APPENDECTOMY     at the of 9 yrs   AV FISTULA PLACEMENT Left 01/04/2022   Procedure: ARTERIOVENOUS (AV) FISTULA CREATION;  Surgeon: Marea Selinda RAMAN, MD;  Location: [facility] ORS;  Service: Vascular;  Laterality: Left;   CAPD INSERTION N/A 08/10/2021   Procedure: LAPAROSCOPIC INSERTION CONTINUOUS AMBULATORY PERITONEAL DIALYSIS  (CAPD) CATHETER;  Surgeon: Jordis Laneta FALCON, MD;  Location: [facility] OR[facility];  Service: General;  Laterality: N/A;   CAPD REVISION N/A 09/21/2021   Procedure: LAPAROSCOPIC REVISION CONTINUOUS AMBULATORY PERITONEAL DIALYSIS  (CAPD) CATHETER;  Surgeon: Jordis Laneta FALCON, MD;  Location: [facility] ORS;  Service: General;  Laterality: N/A;   CATARACT EXTRACTION Left    COLONOSCOPY     COLONOSCOPY WITH PROPOFOL  N/A 01/02/2017   Procedure: COLONOSCOPY WITH PROPOFOL ;  Surgeon: Viktoria Lamar DASEN, MD;  Location: [facility] ENDOSCOPY;  Service: Endoscopy;  Laterality: N/A;   DIALYSIS/PERMA CATHETER INSERTION N/A 03/27/2021   Procedure: DIALYSIS/PERMA CATHETER INSERTION;  Surgeon: Marea Selinda RAMAN, MD;  Location: [facility];  Service: Cardiovascular;  Laterality: N/A;  DIALYSIS/PERMA CATHETER INSERTION N/A 10/10/2021   Procedure: DIALYSIS/PERMA CATHETER INSERTION;  Surgeon: Jama Cordella MATSU, MD;  Location: [facility];  Service: Cardiovascular;  Laterality: N/A;   DIALYSIS/PERMA CATHETER REMOVAL N/A 05/08/2021   Procedure: DIALYSIS/PERMA CATHETER REMOVAL;  Surgeon: Marea Selinda RAMAN, MD;  Location: [facility];  Service: Cardiovascular;  Laterality: N/A;   DIALYSIS/PERMA CATHETER REMOVAL N/A 04/17/2022   Procedure: DIALYSIS/PERMA CATHETER REMOVAL;  Surgeon: Jama Cordella MATSU, MD;  Location: [facility]  LAB;  Service: Cardiovascular;  Laterality: N/A;   INSERTION OF MESH  08/10/2021   Procedure: INSERTION OF MESH;  Surgeon: Jordis Laneta FALCON, MD;  Location: [facility] OR[facility];  Service: General;;   JOINT REPLACEMENT     RIGHT TOTAL HIP   KNEE ARTHROSCOPY WITH LATERAL RELEASE Right 04/20/2021   Procedure: Right knee medial retinaculum and lateral release with polyethylene exchange;  Surgeon: Kathlynn Sharper, MD;  Location: [facility] OR[facility];  Service: Orthopedics;  Laterality: Right;   TONSILLECTOMY     as a child   TOTAL HIP ARTHROPLASTY  09/2009   Right femoral neck fracture   TOTAL HIP ARTHROPLASTY Left 03/06/2016   Procedure: TOTAL HIP ARTHROPLASTY ANTERIOR APPROACH;  Surgeon: Sharper Kathlynn, MD;  Location: [facility] ORS;  Service: Orthopedics;  Laterality: Left;   TOTAL KNEE ARTHROPLASTY Right 03/21/2021   Procedure: TOTAL KNEE ARTHROPLASTY;  Surgeon: Kathlynn Sharper, MD;  Location: [facility] OR[facility];  Service: Orthopedics;  Laterality: Right;   UMBILICAL HERNIA REPAIR  08/10/2021   Procedure: HERNIA REPAIR UMBILICAL ADULT;  Surgeon: Jordis Laneta FALCON, MD;  Location: [facility] OR[facility];  Service: General;;    No Known Allergies  Social History   Tobacco Use   Smoking status: Former    Current packs/day: 0.00    Types: Cigarettes    Start date: 01/01/1966    Quit date: 01/01/1981    Years since quitting: 42.7    Passive exposure: Past   Smokeless tobacco: Never  Substance Use Topics   Alcohol use: Not Currently    Family History  Problem Relation Age of Onset   Cirrhosis Mother    Other Father        Lung Fibrosis   Prostate cancer Neg Hx    Kidney cancer Neg Hx    Bladder Cancer Neg Hx    Prior to Admission medications   Medication Sig Start Date End Date Taking? Authorizing Provider  apixaban  (ELIQUIS ) 2.5 MG TABS tablet Take 1 tablet (2.5 mg total) by mouth 2 (two) times daily. 04/18/22  Yes   calcitRIOL  (ROCALTROL ) 0.25 MCG capsule Take 0.25 mcg by mouth daily. 07/11/21  Yes [provider]  carvedilol  (COREG )  3.125 MG tablet Take 6.25 mg by mouth in the morning and at bedtime.   Yes [provider]  cholecalciferol  (VITAMIN D3) 25 MCG (1000 UNIT) tablet Take 1,000 Units by mouth daily.   Yes [provider]  cyanocobalamin  (VITAMIN B12) 1000 MCG tablet Take 1,000 mcg by mouth daily.   Yes [provider]  docusate sodium  (COLACE) 50 MG capsule Take 50 mg by mouth daily.   Yes [provider]  Multiple Vitamin (MULTIVITAMIN WITH MINERALS) TABS tablet Take 1 tablet by mouth daily. Taking a prescription multi for kidney patients   Yes [provider]  Multiple Vitamins-Minerals (PRESERVISION AREDS 2 PO) Take 1 capsule by mouth in the morning and at bedtime.   Yes [provider]  rosuvastatin  (CRESTOR ) 10 MG tablet Take 10 mg by mouth at bedtime.  Yes [provider]  tamsulosin  (FLOMAX ) 0.4 MG CAPS capsule Take 0.4 mg by mouth daily.   Yes [provider]  torsemide  (DEMADEX ) 20 MG tablet Take 2 tablets (40 mg total) by mouth daily. Patient taking differently: Take 20 mg by mouth 2 (two) times daily. 02/18/18  Yes Mayo, Rockie Overly, MD  traZODone  (DESYREL ) 50 MG tablet Take 50 mg by mouth at bedtime as needed for sleep. 10/03/21  Yes [provider]  rosuvastatin  (CRESTOR ) 5 MG tablet Take 1 tablet (5 mg total) by mouth daily. Patient not taking: No sig reported 10/08/22     Saccharomyces boulardii (PROBIOTIC) 250 MG CAPS Take 1 capsule by mouth in the morning and at bedtime. Patient not taking: No sig reported 01/31/22   Lang Dover, MD  sevelamer  carbonate (RENVELA ) 800 MG tablet Take 1,600 mg by mouth 3 (three) times daily. Patient not taking: Reported on 09/27/2023 06/14/22   [provider]  sevelamer  carbonate (RENVELA ) 800 MG tablet Take 2 tablets (1,600 mg total) by mouth 3 (three) times daily with meals. Patient not taking: Reported on 09/27/2023 06/14/22   Eugenia Lum SQUIBB, PA-C     Review of  Systems  Positive ROS: As above  All other systems have been reviewed and were otherwise negative with the exception of those mentioned in the HPI and as above.  Objective: Vital signs in last 24 hours: Temp:  [97.7 F (36.5 C)] 97.7 F (36.5 C) (10/01 0654) Pulse Rate:  [72] 72 (10/01 0654) Resp:  [18] 18 (10/01 0654) BP: (158)/(67) 158/67 (10/01 0654) SpO2:  [97 %] 97 % (10/01 0654) Weight:  [77.6 kg] 77.6 kg (10/01 0654) Estimated body mass index is 24.54 kg/m as calculated from the following:   Height as of this encounter: 5' 10 (1.778 m).   Weight as of this encounter: 77.6 kg.   General Appearance: Alert Head: Normocephalic, without obvious abnormality, atraumatic Eyes: PERRL, conjunctiva/corneas clear, EOM's intact,    Ears: Normal  Throat: Normal  Neck: Supple, Back: unremarkable Lungs: Clear to auscultation bilaterally, respirations unlabored Heart: Regular rate and rhythm, no murmur, rub or gallop Abdomen: Soft, non-tender Extremities: Extremities normal, atraumatic, no cyanosis or edema Skin: unremarkable  NEUROLOGIC:   Mental status: alert and oriented,Motor Exam - grossly normal Sensory Exam - grossly normal Reflexes:  Coordination - grossly normal Gait - grossly normal Balance - grossly normal Cranial Nerves: I: smell Not tested  II: visual acuity  OS: Normal  OD: Normal   II: visual fields Full to confrontation  II: pupils Equal, round, reactive to light  III,VII: ptosis None  III,IV,VI: extraocular muscles  Full ROM  V: mastication Normal  V: facial light touch sensation  Normal  V,VII: corneal reflex  Present  VII: facial muscle function - upper  Normal  VII: facial muscle function - lower Normal  VIII: hearing Not tested  IX: soft palate elevation  Normal  IX,X: gag reflex Present  XI: trapezius strength  5/5  XI: sternocleidomastoid strength 5/5  XI: neck flexion strength  5/5  XII: tongue strength  Normal    Data Review Lab Results   Component Value Date   WBC 5.3 09/30/2023   HGB 11.2 (L) 10/02/2023   HCT 33.0 (L) 10/02/2023   MCV 105.4 (H) 09/30/2023   PLT 208 09/30/2023   Lab Results  Component Value Date   NA 141 10/02/2023   K 3.5 10/02/2023   CL 98 10/02/2023   CO2 30 09/30/2023  BUN 22 10/02/2023   CREATININE 3.60 (H) 10/02/2023   GLUCOSE 104 (H) 10/02/2023   Lab Results  Component Value Date   INR 1.7 (H) 02/02/2022    Assessment/Plan: Pathologic thoracic compression fracture, osteoporosis, thoracic spine pain: I discussed the situation with the patient and his wife.  I reviewed his imaging studies with him and pointed out the abnormalities.  We have discussed the various treatment options including a T12 kyphoplasty.  I have shown him surgical models.  I have given him a surgical pamphlet.  We have discussed the risk, benefits, alternatives, expected postop course, and likelihood of achieving our goals with surgery.  I have answered all the patient's questions.  He has decided proceed with surgery.   Lawrence Perkins Budge 10/02/2023 8:28 AM

## 2023-10-02 NOTE — Discharge Summary (Signed)
 Physician Discharge Summary     Providing Compassionate, Quality Care - Together   Patient ID: Lawrence Perkins MRN: [id number] DOB/AGE: [birth year] 83 y.o.  Admit date: 10/02/2023 Discharge date: 10/02/2023  Admission Diagnoses: Thoracic compression fracture with delayed healing  Discharge Diagnoses:  Principal Problem:   Thoracic compression fracture, with delayed healing, subsequent encounter   Discharged Condition: good  Hospital Course: Patient underwent a T12 hypoplastic by Dr. Mavis on 10/02/2023. He was admitted to 3C07 following recovery from anesthesia in the PACU. His postoperative course has been uncomplicated. He is ambulating independently and without difficulty. He is tolerating a normal diet. He is not having any bowel or bladder dysfunction. His pain is well-controlled with oral pain medication. He is ready for discharge home.   Consults: None  Treatments: surgery: Bilateral T12 kyphoplasty, bilateral T12 vertebral body biopsy  Discharge Exam: Blood pressure (!) 168/78, pulse (!) 59, temperature 97.7 F (36.5 C), resp. rate 16, height 5' 10 (1.778 m), weight 77.6 kg, SpO2 100%.  Per report: Alert and oriented x 4 PERRLA CN II-XII grossly intact MAE, Strength and sensation intact Incision is covered with Honeycomb dressing and Steri Strips; Dressing is clean, dry, and intact   Disposition: Discharge disposition: 01-Home or Self Care       Discharge Instructions     Call MD for:  difficulty breathing, headache or visual disturbances   Complete by: As directed    Call MD for:  hives   Complete by: As directed    Call MD for:  persistant nausea and vomiting   Complete by: As directed    Call MD for:  redness, tenderness, or signs of infection (pain, swelling, redness, odor or green/yellow discharge around incision site)   Complete by: As directed    Call MD for:  severe uncontrolled pain   Complete by: As directed    Diet - low sodium heart  healthy   Complete by: As directed    If the dressing is still on your incision site when you go home, remove it on the third day after your surgery date. Remove dressing if it begins to fall off, or if it is dirty or damaged before the third day.   Complete by: As directed    Increase activity slowly   Complete by: As directed       Allergies as of 10/02/2023   No Known Allergies      Medication List     PAUSE taking these medications    Eliquis  2.5 MG Tabs tablet Wait to take this until: October 05, 2023 Generic drug: apixaban  Take 1 tablet (2.5 mg total) by mouth 2 (two) times daily.       TAKE these medications    calcitRIOL  0.25 MCG capsule Commonly known as: ROCALTROL  Take 0.25 mcg by mouth daily.   carvedilol  3.125 MG tablet Commonly known as: COREG  Take 6.25 mg by mouth in the morning and at bedtime.   cholecalciferol  25 MCG (1000 UNIT) tablet Commonly known as: VITAMIN D3 Take 1,000 Units by mouth daily.   cyanocobalamin  1000 MCG tablet Commonly known as: VITAMIN B12 Take 1,000 mcg by mouth daily.   docusate sodium  50 MG capsule Commonly known as: COLACE Take 50 mg by mouth daily.   multivitamin with minerals Tabs tablet Take 1 tablet by mouth daily. Taking a prescription multi for kidney patients   oxyCODONE -acetaminophen  5-325 MG tablet Commonly known as: Percocet Take 1 tablet by mouth every 6 (six) hours as needed.  PRESERVISION AREDS 2 PO Take 1 capsule by mouth in the morning and at bedtime.   Probiotic 250 MG Caps Take 1 capsule by mouth in the morning and at bedtime.   rosuvastatin  10 MG tablet Commonly known as: CRESTOR  Take 10 mg by mouth at bedtime.   rosuvastatin  5 MG tablet Commonly known as: CRESTOR  Take 1 tablet (5 mg total) by mouth daily.   sevelamer  carbonate 800 MG tablet Commonly known as: RENVELA  Take 1,600 mg by mouth 3 (three) times daily.   sevelamer  carbonate 800 MG tablet Commonly known as: RENVELA  Take 2  tablets (1,600 mg total) by mouth 3 (three) times daily with meals.   tamsulosin  0.4 MG Caps capsule Commonly known as: FLOMAX  Take 0.4 mg by mouth daily.   torsemide  20 MG tablet Commonly known as: DEMADEX  Take 2 tablets (40 mg total) by mouth daily. What changed:  how much to take when to take this   traZODone  50 MG tablet Commonly known as: DESYREL  Take 50 mg by mouth at bedtime as needed for sleep.               Discharge Care Instructions  (From admission, onward)           Start     Ordered   10/02/23 0000  If the dressing is still on your incision site when you go home, remove it on the third day after your surgery date. Remove dressing if it begins to fall off, or if it is dirty or damaged before the third day.        10/02/23 1446            Follow-up Information     Mavis Purchase, MD. Go on 10/15/2023.   Specialty: Neurosurgery Why: First post op appoinement is on 10/15/2023 at 2:15 PM. Contact information: [address] [place] [address] [phone]                 Signed: Gerard Beck, DNP, AGNP-C Nurse Practitioner  [facility] [address], [address], [place], [place] [address] P: [phone]    F: [phone]  10/02/2023, 2:46 PM

## 2023-10-02 NOTE — Transfer of Care (Signed)
 Immediate Anesthesia Transfer of Care Note  Patient: Lawrence Perkins  Procedure(s) Performed: KYPHOPLASTY THORACIC TWELVE  Patient Location: PACU  Anesthesia Type:General  Level of Consciousness: awake, drowsy, and patient cooperative  Airway & Oxygen Therapy: Patient Spontanous Breathing and Patient connected to face mask oxygen  Post-op Assessment: Report given to RN, Post -op Vital signs reviewed and stable, and Patient moving all extremities  Post vital signs: Reviewed and stable  Last Vitals:  Vitals Value Taken Time  BP 168/82   Temp    Pulse 70   Resp    SpO2 100     Last Pain:  Vitals:   10/02/23 0654  TempSrc: Oral  PainSc: 2       Patients Stated Pain Goal: 1 (10/02/23 0654)  Complications: No notable events documented.

## 2023-10-02 NOTE — Anesthesia Procedure Notes (Addendum)
 Procedure Name: Intubation Date/Time: 10/02/2023 8:46 AM  Performed by: Lansing Hildegard NOVAK, CRNAPre-anesthesia Checklist: Patient identified, Patient being monitored, Timeout performed, Emergency Drugs available and Suction available Patient Re-evaluated:Patient Re-evaluated prior to induction Oxygen Delivery Method: Circle system utilized Preoxygenation: Pre-oxygenation with 100% oxygen Induction Type: IV induction Ventilation: Mask ventilation without difficulty Laryngoscope Size: Mac and 4 Grade View: Grade I Tube type: Oral Tube size: 7.0 mm Number of attempts: 1 Airway Equipment and Method: Stylet Placement Confirmation: ETT inserted through vocal cords under direct vision, positive ETCO2 and breath sounds checked- equal and bilateral Secured at: 23 cm Tube secured with: Tape Dental Injury: Teeth and Oropharynx as per pre-operative assessment  Comments: Atraumatic

## 2023-10-02 NOTE — Progress Notes (Signed)
 Orthopedic Tech Progress Note Patient Details:  Lawrence Perkins July 19, 1940 [id number]  Ortho Devices Type of Ortho Device: Lumbar corsett Ortho Device/Splint Location: BACK Ortho Device/Splint Interventions: Ordered   Post Interventions Patient Tolerated: Well Instructions Provided: Care of device  [name] Pac 10/02/2023, 12:19 PM

## 2023-10-02 NOTE — Evaluation (Signed)
 Physical Therapy Brief Evaluation and Discharge Note Patient Details Name: Lawrence Perkins MRN: [id number] DOB: 1940/07/26 Today's Date: 10/02/2023   History of Present Illness  Pt is 83 year old presented to [facility] on  10/02/23 for kyphoplasty for T12 compression fx. PMH - R TKA, CAD, cardiomyopathy, CHF, atrial fibrillation, aortic atherosclerosis, cardiac syncope, CKD, HTN, OA, BPH, ESRD on HD  Clinical Impression  Pt doing well with mobility after kyphoplasty and has supportive wife to assist at home as needed. Recommended she provide initial supervision for mobility at home over the next day or so. Pt appeared more confident with rolling walker and he has one already at home if he chooses to use it. Ready for dc home from PT standpoint.      PT Assessment Patient does not need any further PT services  Assistance Needed at Discharge  Intermittent Supervision/Assistance    Equipment Recommendations None recommended by PT  Recommendations for Other Services       Precautions/Restrictions Precautions Precautions: Back;Fall Precaution Booklet Issued: Yes (comment) Recall of Precautions/Restrictions: Intact Required Braces or Orthoses: Spinal Brace Spinal Brace: Lumbar corset;Applied in sitting position Restrictions Weight Bearing Restrictions Per Provider Order: No        Mobility  Bed Mobility Rolling: Contact guard assist Supine/Sidelying to sit: Contact guard assist Sit to supine/sidelying: Supervision General bed mobility comments: Verbal cues for technique  Transfers Overall transfer level: Needs assistance Equipment used: None Transfers: Sit to/from Stand Sit to Stand: Contact guard assist, Supervision                Ambulation/Gait Ambulation/Gait assistance: Supervision Gait Distance (Feet): 250 Feet Assistive device: Rolling walker (2 wheels), None Gait Pattern/deviations: Step-through pattern, Decreased step length - right, Decreased step length - left,  Decreased stride length Gait Speed: Below normal General Gait Details: More confident quicker gait using rolling walker  Home Activity Instructions    Stairs Stairs: Yes Stairs assistance: Contact guard assist Stair Management: One rail Left, Step to pattern, Forwards Number of Stairs: 3    Modified Rankin (Stroke Patients Only)        Balance Overall balance assessment: Mild deficits observed, not formally tested                        Pertinent Vitals/Pain PT - Brief Vital Signs All Vital Signs Stable: Yes Pain Assessment Pain Assessment: 0-10 Pain Score: 2  Pain Location: back Pain Descriptors / Indicators: Sore Pain Intervention(s): Monitored during session     Home Living Family/patient expects to be discharged to:: Private residence Living Arrangements: Spouse/significant other Available Help at Discharge: Family;Available 24 hours/day Home Environment: Stairs to enter;Rail - left;Rail - right  Stairs-Number of Steps: 3-4 Home Equipment: Rolling Walker (2 wheels);Shower seat - built in;BSC/3in1;Cane - single point        Prior Function Level of Independence: Independent Comments: Drives himself to HD    UE/LE Assessment   UE ROM/Strength/Tone/Coordination: WFL    LE ROM/Strength/Tone/Coordination: [facility]      Communication   Communication Communication: No apparent difficulties     Cognition Overall Cognitive Status: Appears within functional limits for tasks assessed/performed       General Comments      Exercises     Assessment/Plan    PT Problem List         PT Visit Diagnosis Other abnormalities of gait and mobility (R26.89)    No Skilled PT Patient will have necessary level of assist  by caregiver at discharge   Co-evaluation                AMPAC 6 Clicks Help needed turning from your back to your side while in a flat bed without using bedrails?: A Little Help needed moving from lying on your back to sitting  on the side of a flat bed without using bedrails?: A Little Help needed moving to and from a bed to a chair (including a wheelchair)?: A Little Help needed standing up from a chair using your arms (e.g., wheelchair or bedside chair)?: A Little Help needed to walk in hospital room?: A Little Help needed climbing 3-5 steps with a railing? : A Little 6 Click Score: 18      End of Session Equipment Utilized During Treatment: Gait belt;Back brace Activity Tolerance: Patient tolerated treatment well Patient left: in bed;with call bell/phone within reach;with family/visitor present Nurse Communication: Mobility status PT Visit Diagnosis: Other abnormalities of gait and mobility (R26.89)     Time: 8581-8559 PT Time Calculation (min) (ACUTE ONLY): 22 min  Charges:   PT Evaluation $PT Eval Low Complexity: 1 Low      [facility] PT Acute Rehabilitation Services Office [phone]   Rodgers ORN [facility]  10/02/2023, 2:51 PM

## 2023-10-02 NOTE — Op Note (Signed)
 Brief history: The patient is a 83 year old white male who complained of back pain.  He was diagnosed with a T12 compression fracture.  I discussed the various treatment options.  He has decided to proceed with a kyphoplasty.  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 Chyrl Cedar  Assistant: None  Estimated blood loss: Minimal  Specimens: Vertebral body biopsy  Complications: None  Description of procedure: The patient was brought to the operating room by the anesthesia team.  General endotracheal anesthesia was induced.  He was turned to the prone position on the chest rolls.  His thoracolumbar region was then prepared with Betadine scrub and Betadine solution.  Sterile drapes were applied.  I injected the area to be incised with Marcaine  with epinephrine  solution.  I used a scalpel to make small incisions over the patient's bilateral T12 pedicles.  I then cannulated the patient's bilateral T12 pedicles with a Jamshidi needle.  I obtain bilateral T12 vertebral body biopsies.  I inserted the balloon inflated balloon deflated the balloon and removed the balloon.  I then inserted bone cement into the T12 vertebral bodies bilaterally under fluoroscopic guidance.  We got good bony filling.  I then removed the cannulas.  I reapproximated the patient's subcutaneous tissue with interrupted 3-0 Vicryl suture.  I reapproximated the skin with Steri-Strips and benzoin.  I then coated the wound with bacitracin ointment.  A sterile dressing was applied.  The drapes were removed.  By report all sponge, instrument, and needle counts were correct at the end of this case.

## 2023-10-02 NOTE — Discharge Instructions (Signed)
 Wound Care Keep incision covered and dry until post op day 3. You may remove the Honeycomb dressing on post op day 3. Leave steri-strips on back.  They will fall off by themselves. Do not put any creams, lotions, or ointments on incision. You are fine to shower. Let water run over incision and pat dry.  Activity Walk each and every day, increasing distance each day. No lifting greater than 5 lbs. No driving for 2 weeks; may ride as a passenger locally.  Diet Resume your normal diet.   Return to Work Will be discussed at your follow up appointment.  Call Your Doctor If Any of These Occur Redness, drainage, or swelling at the wound.  Temperature greater than 101 degrees. Severe pain not relieved by pain medication. Incision starts to come apart.  Follow Up Appt Call [phone] today for appointment in 2 weeks if you don't already have one or for any problems.

## 2023-10-02 NOTE — Plan of Care (Signed)
 Pt doing well. Pt and wife given D/C instructions with verbal understanding. Rx's were sent to the pharmacy by MD. Pt's incision is clean and dry with no sign of infection. Pt's IV was removed prior to D/C. Pt D/C'd home via wheelchair per MD. Pt is stable @ D/C and has no other needs at this time. Rosina Rakers, RN

## 2023-10-03 ENCOUNTER — Encounter (HOSPITAL_COMMUNITY): Payer: Self-pay | Admitting: Neurosurgery

## 2023-10-04 LAB — SURGICAL PATHOLOGY

## 2023-10-07 DIAGNOSIS — I2510 Atherosclerotic heart disease of native coronary artery without angina pectoris: Secondary | ICD-10-CM | POA: Diagnosis not present

## 2023-10-07 DIAGNOSIS — I120 Hypertensive chronic kidney disease with stage 5 chronic kidney disease or end stage renal disease: Secondary | ICD-10-CM | POA: Diagnosis not present

## 2023-10-07 DIAGNOSIS — R7303 Prediabetes: Secondary | ICD-10-CM | POA: Diagnosis not present

## 2023-10-07 DIAGNOSIS — N185 Chronic kidney disease, stage 5: Secondary | ICD-10-CM | POA: Diagnosis not present

## 2023-10-07 DIAGNOSIS — I5022 Chronic systolic (congestive) heart failure: Secondary | ICD-10-CM | POA: Diagnosis not present

## 2023-10-07 DIAGNOSIS — I4891 Unspecified atrial fibrillation: Secondary | ICD-10-CM | POA: Diagnosis not present

## 2023-10-14 ENCOUNTER — Other Ambulatory Visit: Payer: Self-pay | Admitting: Internal Medicine

## 2023-10-14 DIAGNOSIS — I120 Hypertensive chronic kidney disease with stage 5 chronic kidney disease or end stage renal disease: Secondary | ICD-10-CM | POA: Diagnosis not present

## 2023-10-14 DIAGNOSIS — N185 Chronic kidney disease, stage 5: Secondary | ICD-10-CM | POA: Diagnosis not present

## 2023-10-14 DIAGNOSIS — Z1331 Encounter for screening for depression: Secondary | ICD-10-CM | POA: Diagnosis not present

## 2023-10-14 DIAGNOSIS — N289 Disorder of kidney and ureter, unspecified: Secondary | ICD-10-CM

## 2023-10-14 DIAGNOSIS — R7303 Prediabetes: Secondary | ICD-10-CM | POA: Diagnosis not present

## 2023-10-14 DIAGNOSIS — I5022 Chronic systolic (congestive) heart failure: Secondary | ICD-10-CM | POA: Diagnosis not present

## 2023-10-14 DIAGNOSIS — R93421 Abnormal radiologic findings on diagnostic imaging of right kidney: Secondary | ICD-10-CM

## 2023-10-14 DIAGNOSIS — Z0000 Encounter for general adult medical examination without abnormal findings: Secondary | ICD-10-CM | POA: Diagnosis not present

## 2023-10-14 DIAGNOSIS — D472 Monoclonal gammopathy: Secondary | ICD-10-CM | POA: Diagnosis not present

## 2023-10-14 DIAGNOSIS — I4891 Unspecified atrial fibrillation: Secondary | ICD-10-CM | POA: Diagnosis not present

## 2023-10-14 NOTE — Anesthesia Postprocedure Evaluation (Signed)
 Anesthesia Post Note  Patient: Gideon Burstein  Procedure(s) Performed: KYPHOPLASTY THORACIC TWELVE     Patient location during evaluation: PACU Anesthesia Type: General Level of consciousness: awake and alert Pain management: pain level controlled Vital Signs Assessment: post-procedure vital signs reviewed and stable Respiratory status: spontaneous breathing, nonlabored ventilation, respiratory function stable and patient connected to nasal cannula oxygen Cardiovascular status: blood pressure returned to baseline and stable Postop Assessment: no apparent nausea or vomiting Anesthetic complications: no   No notable events documented.  Last Vitals:  Vitals:   10/02/23 1147 10/02/23 1202  BP: (!) 151/67 (!) 168/78  Pulse: 65 (!) 59  Resp: 14 16  Temp: 36.5 C   SpO2: 96% 100%    Last Pain:  Vitals:   10/02/23 1210  TempSrc:   PainSc: 0-No pain                 Epifanio Lamar BRAVO

## 2023-10-22 DIAGNOSIS — D2262 Melanocytic nevi of left upper limb, including shoulder: Secondary | ICD-10-CM | POA: Diagnosis not present

## 2023-10-22 DIAGNOSIS — L821 Other seborrheic keratosis: Secondary | ICD-10-CM | POA: Diagnosis not present

## 2023-10-22 DIAGNOSIS — Z85820 Personal history of malignant melanoma of skin: Secondary | ICD-10-CM | POA: Diagnosis not present

## 2023-10-22 DIAGNOSIS — L570 Actinic keratosis: Secondary | ICD-10-CM | POA: Diagnosis not present

## 2023-10-22 DIAGNOSIS — D2272 Melanocytic nevi of left lower limb, including hip: Secondary | ICD-10-CM | POA: Diagnosis not present

## 2023-10-22 DIAGNOSIS — Z08 Encounter for follow-up examination after completed treatment for malignant neoplasm: Secondary | ICD-10-CM | POA: Diagnosis not present

## 2023-10-22 DIAGNOSIS — D225 Melanocytic nevi of trunk: Secondary | ICD-10-CM | POA: Diagnosis not present

## 2023-10-22 DIAGNOSIS — Z86006 Personal history of melanoma in-situ: Secondary | ICD-10-CM | POA: Diagnosis not present

## 2023-10-22 DIAGNOSIS — D2271 Melanocytic nevi of right lower limb, including hip: Secondary | ICD-10-CM | POA: Diagnosis not present

## 2023-10-22 DIAGNOSIS — Z85828 Personal history of other malignant neoplasm of skin: Secondary | ICD-10-CM | POA: Diagnosis not present

## 2023-10-22 DIAGNOSIS — D2261 Melanocytic nevi of right upper limb, including shoulder: Secondary | ICD-10-CM | POA: Diagnosis not present

## 2023-10-23 ENCOUNTER — Ambulatory Visit
Admission: RE | Admit: 2023-10-23 | Discharge: 2023-10-23 | Disposition: A | Discharge status: Home / Self Care | Source: Ambulatory Visit | Attending: Internal Medicine | Admitting: Internal Medicine

## 2023-10-23 DIAGNOSIS — R93421 Abnormal radiologic findings on diagnostic imaging of right kidney: Secondary | ICD-10-CM | POA: Insufficient documentation

## 2023-10-23 DIAGNOSIS — N400 Enlarged prostate without lower urinary tract symptoms: Secondary | ICD-10-CM | POA: Diagnosis not present

## 2023-10-23 DIAGNOSIS — N289 Disorder of kidney and ureter, unspecified: Secondary | ICD-10-CM | POA: Diagnosis not present

## 2023-11-01 DIAGNOSIS — Z992 Dependence on renal dialysis: Secondary | ICD-10-CM | POA: Diagnosis not present

## 2023-11-01 DIAGNOSIS — N186 End stage renal disease: Secondary | ICD-10-CM | POA: Diagnosis not present

## 2023-11-02 DIAGNOSIS — D631 Anemia in chronic kidney disease: Secondary | ICD-10-CM | POA: Diagnosis not present

## 2023-11-02 DIAGNOSIS — Z992 Dependence on renal dialysis: Secondary | ICD-10-CM | POA: Diagnosis not present

## 2023-11-02 DIAGNOSIS — D509 Iron deficiency anemia, unspecified: Secondary | ICD-10-CM | POA: Diagnosis not present

## 2023-11-07 ENCOUNTER — Other Ambulatory Visit (INDEPENDENT_AMBULATORY_CARE_PROVIDER_SITE_OTHER): Payer: Self-pay | Admitting: Nurse Practitioner

## 2023-11-07 DIAGNOSIS — S22080G Wedge compression fracture of T11-T12 vertebra, subsequent encounter for fracture with delayed healing: Secondary | ICD-10-CM | POA: Diagnosis not present

## 2023-11-07 DIAGNOSIS — N186 End stage renal disease: Secondary | ICD-10-CM

## 2023-11-07 DIAGNOSIS — M5416 Radiculopathy, lumbar region: Secondary | ICD-10-CM | POA: Diagnosis not present

## 2023-11-11 ENCOUNTER — Encounter (INDEPENDENT_AMBULATORY_CARE_PROVIDER_SITE_OTHER): Payer: Self-pay | Admitting: Nurse Practitioner

## 2023-11-11 ENCOUNTER — Ambulatory Visit (INDEPENDENT_AMBULATORY_CARE_PROVIDER_SITE_OTHER): Admitting: Nurse Practitioner

## 2023-11-11 ENCOUNTER — Ambulatory Visit (INDEPENDENT_AMBULATORY_CARE_PROVIDER_SITE_OTHER)

## 2023-11-11 VITALS — BP 171/73 | HR 64 | Resp 18 | Wt 173.2 lb

## 2023-11-11 DIAGNOSIS — N186 End stage renal disease: Secondary | ICD-10-CM

## 2023-11-11 DIAGNOSIS — E785 Hyperlipidemia, unspecified: Secondary | ICD-10-CM | POA: Diagnosis not present

## 2023-11-11 NOTE — Progress Notes (Signed)
 Subjective:    Patient ID: Lawrence Perkins, male    DOB: [birth year], 83 y.o.   MRN: [id number] Chief Complaint  Patient presents with   Follow-up    Ref [facility] consult access sounding diminished     The patient presents today for follow-up evaluation of his dialysis access.  It is reported that there is diminished sound in the area in his fistula.  The patient denies any problems currently during dialysis.  Denies any alarms.  He denies not being able to make it for full session of dialysis.  He also denies any bleeding issues.  He is maintained by left brachiocephalic AV fistula.  He has a flow volume of 1808.  It is noted that he has stenosis at the mid upper arm area.    Review of Systems  All other systems reviewed and are negative.      Objective:   Physical Exam Vitals reviewed.  Cardiovascular:     Rate and Rhythm: Normal rate.     Pulses:          Radial pulses are 2+ on the left side.     Arteriovenous access: Left arteriovenous access is present.     Comments: Good thrill and bruit Pulmonary:     Effort: Pulmonary effort is normal.  Skin:    General: Skin is warm and dry.  Neurological:     Mental Status: He is alert and oriented to person, place, and time.  Psychiatric:        Mood and Affect: Mood normal.        Behavior: Behavior normal.        Thought Content: Thought content normal.        Judgment: Judgment normal.     BP (!) 171/73 (BP Location: Right Arm)   Pulse 64   Resp 18   Wt 173 lb 3.2 oz (78.6 kg)   BMI 24.85 kg/m   Past Medical History:  Diagnosis Date   Anemia    Aortic atherosclerosis    Arthritis    Atrial fibrillation and flutter (HCC)    a.) CHA2DS2-VASc = 5 (age x 2, CHF, HTN, aortic plaque). b.) rate/rhythm maintain on oral carvedilol ; chronically anticoagulated with dose reduced apixaban    Bilateral inguinal hernia    BPH (benign prostatic hyperplasia)    Cardiac syncope    Cardiomyopathy (HCC)    a.) TTE 04/23/2016: EF  40%. b.) TTE 09/03/2016: EF 50%. c.) TTE 05/04/2019: EF >55%.   CHF (congestive heart failure) (HCC)    a.) TTE 04/23/2016: EF 40%; BAE; mild AR/TR, mod MR; G1DD. b.) TTE 09/03/2016: EF 50%; mild LVH; BAE; triv AR/PR, mod MR/TR. 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    a.) s/p resection   MGUS (monoclonal gammopathy of unknown significance)    Pleural effusion    Rectal bleeding  Secondary hyperparathyroidism of renal origin     Social History   Socioeconomic History   Marital status: Married    Spouse name: Elveria   Number of children: Not on file   Years of education: Not on file   Highest education level: Not on file  Occupational History   Occupation: Retired  Tobacco Use   Smoking status: Former    Current packs/day: 0.00    Types: Cigarettes    Start date: 01/01/1966    Quit date: 01/01/1981    Years since quitting: 42.8    Passive exposure: Past   Smokeless tobacco: Never  Vaping Use   Vaping status: Never Used  Substance and Sexual Activity   Alcohol use: Not Currently   Drug use: No   Sexual activity: Yes    Birth control/protection: None  Other Topics Concern   Not on file  Social History Narrative   Regular exercise: Yes   Social Drivers of Health   Financial Resource Strain: Low Risk  (10/14/2023)   [facility]   Overall Financial Resource Strain (CARDIA)    Difficulty of Paying Living Expenses: Not hard at all  Food  Insecurity: No Food Insecurity (10/14/2023)   Received from [facility]   Hunger Vital Sign    Within the past 12 months, you worried that your food would run out before you got the money to buy more.: Never true    Within the past 12 months, the food you bought just didn't last and you didn't have money to get more.: Never true  Transportation Needs: No Transportation Needs (10/14/2023)   Received from [facility] - Transportation    In the past 12 months, has lack of transportation kept you from medical appointments or from getting medications?: No    Lack of Transportation (Non-Medical): No  Physical Activity: Not on file  Stress: Not on file  Social Connections: Unknown (10/02/2021)   Received from [facility]   Social Network    Social Network: Not on file  Intimate Partner Violence: Not At Risk (02/02/2022)   Humiliation, Afraid, Rape, and Kick questionnaire    Fear of Current or Ex-Partner: No    Emotionally Abused: No    Physically Abused: No    Sexually Abused: No    Past Surgical History:  Procedure Laterality Date   A/V FISTULAGRAM Left 06/11/2022   Procedure: A/V Fistulagram;  Surgeon: Marea Selinda RAMAN, MD;  Location: [facility] IN[facility];  Service: Cardiovascular;  Laterality: Left;   A/V FISTULAGRAM Left 10/02/2022   Procedure: A/V Fistulagram;  Surgeon: Marea Selinda RAMAN, MD;  Location: [facility];  Service: Cardiovascular;  Laterality: Left;   A/V FISTULAGRAM Left 06/17/2023   Procedure: A/V Fistulagram;  Surgeon: Marea Selinda RAMAN, MD;  Location: [facility];  Service: Cardiovascular;  Laterality: Left;   ANKLE SURGERY Right 1985   following volleyball injury   APPENDECTOMY     at the of 9 yrs   AV FISTULA PLACEMENT Left 01/04/2022   Procedure: ARTERIOVENOUS (AV) FISTULA CREATION;  Surgeon: Marea Selinda RAMAN, MD;  Location: [facility] ORS;  Service: Vascular;  Laterality: Left;   CAPD INSERTION N/A 08/10/2021   Procedure:  LAPAROSCOPIC INSERTION CONTINUOUS AMBULATORY PERITONEAL DIALYSIS  (CAPD) CATHETER;  Surgeon: Jordis Laneta FALCON, MD;  Location: [facility] OR[facility];  Service: General;  Laterality: N/A;   CAPD REVISION N/A 09/21/2021   Procedure: LAPAROSCOPIC REVISION CONTINUOUS AMBULATORY PERITONEAL  DIALYSIS  (CAPD) CATHETER;  Surgeon: Jordis Laneta FALCON, MD;  Location: [facility] OR[facility];  Service: General;  Laterality: N/A;   CATARACT EXTRACTION Left    COLONOSCOPY     COLONOSCOPY WITH PROPOFOL  N/A 01/02/2017   Procedure: COLONOSCOPY WITH PROPOFOL ;  Surgeon: Viktoria Lamar DASEN, MD;  Location: [facility] ENDOSCOPY;  Service: Endoscopy;  Laterality: N/A;   DIALYSIS/PERMA CATHETER INSERTION N/A 03/27/2021   Procedure: DIALYSIS/PERMA CATHETER INSERTION;  Surgeon: Marea Selinda RAMAN, MD;  Location: [facility];  Service: Cardiovascular;  Laterality: N/A;   DIALYSIS/PERMA CATHETER INSERTION N/A 10/10/2021   Procedure: DIALYSIS/PERMA CATHETER INSERTION;  Surgeon: Jama Cordella MATSU, MD;  Location: [facility];  Service: Cardiovascular;  Laterality: N/A;   DIALYSIS/PERMA CATHETER REMOVAL N/A 05/08/2021   Procedure: DIALYSIS/PERMA CATHETER REMOVAL;  Surgeon: Marea Selinda RAMAN, MD;  Location: [facility];  Service: Cardiovascular;  Laterality: N/A;   DIALYSIS/PERMA CATHETER REMOVAL N/A 04/17/2022   Procedure: DIALYSIS/PERMA CATHETER REMOVAL;  Surgeon: Jama Cordella MATSU, MD;  Location: [facility];  Service: Cardiovascular;  Laterality: N/A;   INSERTION OF MESH  08/10/2021   Procedure: INSERTION OF MESH;  Surgeon: Jordis Laneta FALCON, MD;  Location: [facility] OR[facility];  Service: General;;   JOINT REPLACEMENT     RIGHT TOTAL HIP   KNEE ARTHROSCOPY WITH LATERAL RELEASE Right 04/20/2021   Procedure: Right knee medial retinaculum and lateral release with polyethylene exchange;  Surgeon: Kathlynn Sharper, MD;  Location: [facility] ORS;  Service: Orthopedics;  Laterality: Right;   KYPHOPLASTY N/A 10/02/2023   Procedure: KYPHOPLASTY THORACIC TWELVE;  Surgeon:  Mavis Purchase, MD;  Location[facility];  Service: Neurosurgery;  Laterality: N/A;  KYPHOPLASTY THORACIC TWELVE   TONSILLECTOMY     as a child   TOTAL HIP ARTHROPLASTY  09/2009   Right femoral neck fracture   TOTAL HIP ARTHROPLASTY Left 03/06/2016   Procedure: TOTAL HIP ARTHROPLASTY ANTERIOR APPROACH;  Surgeon: Sharper Kathlynn, MD;  Location: [facility] OR[facility];  Service: Orthopedics;  Laterality: Left;   TOTAL KNEE ARTHROPLASTY Right 03/21/2021   Procedure: TOTAL KNEE ARTHROPLASTY;  Surgeon: Kathlynn Sharper, MD;  Location: [facility] OR[facility];  Service: Orthopedics;  Laterality: Right;   UMBILICAL HERNIA REPAIR  08/10/2021   Procedure: HERNIA REPAIR UMBILICAL ADULT;  Surgeon: Jordis Laneta FALCON, MD;  Location: [facility] OR[facility];  Service: General;;    Family History  Problem Relation Age of Onset   Cirrhosis Mother    Other Father        Lung Fibrosis   Prostate cancer Neg Hx    Kidney cancer Neg Hx    Bladder Cancer Neg Hx     No Known Allergies     Latest Ref Rng & Units 10/02/2023    6:59 AM 09/30/2023    9:12 AM 09/03/2023    3:39 PM  CBC  WBC 4.0 - 10.5 K/uL  5.3  6.6   Hemoglobin 13.0 - 17.0 g/dL 88.7  89.0  88.3   Hematocrit 39.0 - 52.0 % 33.0  33.2  34.9   Platelets 150 - 400 K/uL  208  227       CMP     Component Value Date/Time   NA 141 10/02/2023 0659   NA 139 02/04/2014 1449   K 3.5 10/02/2023 0659   K 4.6 02/04/2014 1449   CL 98 10/02/2023 0659   CL 105 02/04/2014 1449   CO2 30 09/30/2023 0912   CO2 27 02/04/2014 1449   GLUCOSE 104 (H) 10/02/2023 0659   GLUCOSE 84  02/04/2014 1449   BUN 22 10/02/2023 0659   BUN 23 (H) 02/04/2014 1449   CREATININE 3.60 (H) 10/02/2023 0659   CREATININE 3.47 (H) 09/03/2023 1539   CREATININE 1.25 02/04/2014 1449   CALCIUM  9.6 09/30/2023 0912   CALCIUM  8.7 02/04/2014 1449   PROT 7.5 09/03/2023 1539   ALBUMIN 4.1 09/03/2023 1539   AST 17 09/03/2023 1539   ALT 11 09/03/2023 1539   ALKPHOS 121 09/03/2023 1539   BILITOT 0.8 09/03/2023 1539   GFRNONAA 13 (L)  09/30/2023 0912   GFRNONAA 17 (L) 09/03/2023 1539   GFRNONAA >60 02/04/2014 1449     No results found.     Assessment & Plan:   1. ESRD (end stage renal disease) (HCC) (Primary) Recommend:  The patient is experiencing increasing problems with their dialysis access.  Patient should have a fistulagram with the intention for intervention.  The intention for intervention is to restore appropriate flow and prevent thrombosis and possible loss of the access.  As well as improve the quality of dialysis therapy.  The risks, benefits and alternative therapies were reviewed in detail with the patient.  All questions were answered.  The patient agrees to proceed with angio/intervention.    The patient will follow up with me in the office after the procedure.   2. Hyperlipidemia, unspecified hyperlipidemia type Continue statin as ordered and reviewed, no changes at this time   Current Outpatient Medications on File Prior to Visit  Medication Sig Dispense Refill   apixaban  (ELIQUIS ) 2.5 MG TABS tablet Take 1 tablet (2.5 mg total) by mouth 2 (two) times daily. 180 tablet 3   calcitRIOL  (ROCALTROL ) 0.25 MCG capsule Take 0.25 mcg by mouth daily.     carvedilol  (COREG ) 3.125 MG tablet Take 6.25 mg by mouth in the morning and at bedtime.     cholecalciferol  (VITAMIN D3) 25 MCG (1000 UNIT) tablet Take 1,000 Units by mouth daily.     cyanocobalamin  (VITAMIN B12) 1000 MCG tablet Take 1,000 mcg by mouth daily.     docusate sodium  (COLACE) 50 MG capsule Take 50 mg by mouth daily.     Multiple Vitamin (MULTIVITAMIN WITH MINERALS) TABS tablet Take 1 tablet by mouth daily. Taking a prescription multi for kidney patients     Multiple Vitamins-Minerals (PRESERVISION AREDS 2 PO) Take 1 capsule by mouth in the morning and at bedtime.     oxyCODONE -acetaminophen  (PERCOCET) 5-325 MG tablet Take 1 tablet by mouth every 6 (six) hours as needed. 20 tablet 0   rosuvastatin  (CRESTOR ) 10 MG tablet Take 10 mg by  mouth at bedtime.     tamsulosin  (FLOMAX ) 0.4 MG CAPS capsule Take 0.4 mg by mouth daily.     torsemide  (DEMADEX ) 20 MG tablet Take 2 tablets (40 mg total) by mouth daily. (Patient taking differently: Take 20 mg by mouth 2 (two) times daily.) 60 tablet 0   traZODone  (DESYREL ) 50 MG tablet Take 50 mg by mouth at bedtime as needed for sleep.     rosuvastatin  (CRESTOR ) 5 MG tablet Take 1 tablet (5 mg total) by mouth daily. (Patient not taking: Reported on 11/11/2023) 90 tablet 3   Saccharomyces boulardii (PROBIOTIC) 250 MG CAPS Take 1 capsule by mouth in the morning and at bedtime. (Patient not taking: Reported on 11/11/2023) 12 capsule 0   sevelamer  carbonate (RENVELA ) 800 MG tablet Take 1,600 mg by mouth 3 (three) times daily. (Patient not taking: Reported on 11/11/2023)     sevelamer  carbonate (RENVELA ) 800 MG tablet  Take 2 tablets (1,600 mg total) by mouth 3 (three) times daily with meals. (Patient not taking: Reported on 11/11/2023) 180 tablet 11   No current facility-administered medications on file prior to visit.    There are no Patient Instructions on file for this visit. No follow-ups on file.   Mahina Salatino E Marelin Tat, NP

## 2023-11-11 NOTE — H&P (View-Only) (Signed)
 Subjective:    Patient ID: Lawrence Perkins, male    DOB: [birth year], 83 y.o.   MRN: [id number] Chief Complaint  Patient presents with   Follow-up    Ref [facility] consult access sounding diminished     The patient presents today for follow-up evaluation of his dialysis access.  It is reported that there is diminished sound in the area in his fistula.  The patient denies any problems currently during dialysis.  Denies any alarms.  He denies not being able to make it for full session of dialysis.  He also denies any bleeding issues.  He is maintained by left brachiocephalic AV fistula.  He has a flow volume of 1808.  It is noted that he has stenosis at the mid upper arm area.    Review of Systems  All other systems reviewed and are negative.      Objective:   Physical Exam Vitals reviewed.  Cardiovascular:     Rate and Rhythm: Normal rate.     Pulses:          Radial pulses are 2+ on the left side.     Arteriovenous access: Left arteriovenous access is present.     Comments: Good thrill and bruit Pulmonary:     Effort: Pulmonary effort is normal.  Skin:    General: Skin is warm and dry.  Neurological:     Mental Status: He is alert and oriented to person, place, and time.  Psychiatric:        Mood and Affect: Mood normal.        Behavior: Behavior normal.        Thought Content: Thought content normal.        Judgment: Judgment normal.     BP (!) 171/73 (BP Location: Right Arm)   Pulse 64   Resp 18   Wt 173 lb 3.2 oz (78.6 kg)   BMI 24.85 kg/m   Past Medical History:  Diagnosis Date   Anemia    Aortic atherosclerosis    Arthritis    Atrial fibrillation and flutter (HCC)    a.) CHA2DS2-VASc = 5 (age x 2, CHF, HTN, aortic plaque). b.) rate/rhythm maintain on oral carvedilol ; chronically anticoagulated with dose reduced apixaban    Bilateral inguinal hernia    BPH (benign prostatic hyperplasia)    Cardiac syncope    Cardiomyopathy (HCC)    a.) TTE 04/23/2016: EF  40%. b.) TTE 09/03/2016: EF 50%. c.) TTE 05/04/2019: EF >55%.   CHF (congestive heart failure) (HCC)    a.) TTE 04/23/2016: EF 40%; BAE; mild AR/TR, mod MR; G1DD. b.) TTE 09/03/2016: EF 50%; mild LVH; BAE; triv AR/PR, mod MR/TR. 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    a.) s/p resection   MGUS (monoclonal gammopathy of unknown significance)    Pleural effusion    Rectal bleeding  Secondary hyperparathyroidism of renal origin     Social History   Socioeconomic History   Marital status: Married    Spouse name: Elveria   Number of children: Not on file   Years of education: Not on file   Highest education level: Not on file  Occupational History   Occupation: Retired  Tobacco Use   Smoking status: Former    Current packs/day: 0.00    Types: Cigarettes    Start date: 01/01/1966    Quit date: 01/01/1981    Years since quitting: 42.8    Passive exposure: Past   Smokeless tobacco: Never  Vaping Use   Vaping status: Never Used  Substance and Sexual Activity   Alcohol use: Not Currently   Drug use: No   Sexual activity: Yes    Birth control/protection: None  Other Topics Concern   Not on file  Social History Narrative   Regular exercise: Yes   Social Drivers of Health   Financial Resource Strain: Low Risk  (10/14/2023)   [facility]   Overall Financial Resource Strain (CARDIA)    Difficulty of Paying Living Expenses: Not hard at all  Food  Insecurity: No Food Insecurity (10/14/2023)   Received from [facility]   Hunger Vital Sign    Within the past 12 months, you worried that your food would run out before you got the money to buy more.: Never true    Within the past 12 months, the food you bought just didn't last and you didn't have money to get more.: Never true  Transportation Needs: No Transportation Needs (10/14/2023)   Received from [facility] - Transportation    In the past 12 months, has lack of transportation kept you from medical appointments or from getting medications?: No    Lack of Transportation (Non-Medical): No  Physical Activity: Not on file  Stress: Not on file  Social Connections: Unknown (10/02/2021)   Received from [facility]   Social Network    Social Network: Not on file  Intimate Partner Violence: Not At Risk (02/02/2022)   Humiliation, Afraid, Rape, and Kick questionnaire    Fear of Current or Ex-Partner: No    Emotionally Abused: No    Physically Abused: No    Sexually Abused: No    Past Surgical History:  Procedure Laterality Date   A/V FISTULAGRAM Left 06/11/2022   Procedure: A/V Fistulagram;  Surgeon: Marea Selinda RAMAN, MD;  Location: [facility]IVE [facility];  Service: Cardiovascular;  Laterality: Left;   A/V FISTULAGRAM Left 10/02/2022   Procedure: A/V Fistulagram;  Surgeon: Marea Selinda RAMAN, MD;  Location: [facility];  Service: Cardiovascular;  Laterality: Left;   A/V FISTULAGRAM Left 06/17/2023   Procedure: A/V Fistulagram;  Surgeon: Marea Selinda RAMAN, MD;  Location: [facility];  Service: Cardiovascular;  Laterality: Left;   ANKLE SURGERY Right 1985   following volleyball injury   APPENDECTOMY     at the of 9 yrs   AV FISTULA PLACEMENT Left 01/04/2022   Procedure: ARTERIOVENOUS (AV) FISTULA CREATION;  Surgeon: Marea Selinda RAMAN, MD;  Location: [facility] ORS;  Service: Vascular;  Laterality: Left;   CAPD INSERTION N/A 08/10/2021   Procedure:  LAPAROSCOPIC INSERTION CONTINUOUS AMBULATORY PERITONEAL DIALYSIS  (CAPD) CATHETER;  Surgeon: Jordis Laneta FALCON, MD;  Location: [facility] ORS;  Service: General;  Laterality: N/A;   CAPD REVISION N/A 09/21/2021   Procedure: LAPAROSCOPIC REVISION CONTINUOUS AMBULATORY PERITONEAL  DIALYSIS  (CAPD) CATHETER;  Surgeon: Jordis Laneta FALCON, MD;  Location: [facility] ORS;  Service: General;  Laterality: N/A;   CATARACT EXTRACTION Left    COLONOSCOPY     COLONOSCOPY WITH PROPOFOL  N/A 01/02/2017   Procedure: COLONOSCOPY WITH PROPOFOL ;  Surgeon: Viktoria Lamar DASEN, MD;  Location: [facility] ENDOSCOPY;  Service: Endoscopy;  Laterality: N/A;   DIALYSIS/PERMA CATHETER INSERTION N/A 03/27/2021   Procedure: DIALYSIS/PERMA CATHETER INSERTION;  Surgeon: Marea Selinda RAMAN, MD;  Location: [facility];  Service: Cardiovascular;  Laterality: N/A;   DIALYSIS/PERMA CATHETER INSERTION N/A 10/10/2021   Procedure: DIALYSIS/PERMA CATHETER INSERTION;  Surgeon: Jama Cordella MATSU, MD;  Location: [facility];  Service: Cardiovascular;  Laterality: N/A;   DIALYSIS/PERMA CATHETER REMOVAL N/A 05/08/2021   Procedure: DIALYSIS/PERMA CATHETER REMOVAL;  Surgeon: Marea Selinda RAMAN, MD;  Location: [facility];  Service: Cardiovascular;  Laterality: N/A;   DIALYSIS/PERMA CATHETER REMOVAL N/A 04/17/2022   Procedure: DIALYSIS/PERMA CATHETER REMOVAL;  Surgeon: Jama Cordella MATSU, MD;  Location: [facility];  Service: Cardiovascular;  Laterality: N/A;   INSERTION OF MESH  08/10/2021   Procedure: INSERTION OF MESH;  Surgeon: Jordis Laneta FALCON, MD;  Location: [facility] ORS;  Service: General;;   JOINT REPLACEMENT     RIGHT TOTAL HIP   KNEE ARTHROSCOPY WITH LATERAL RELEASE Right 04/20/2021   Procedure: Right knee medial retinaculum and lateral release with polyethylene exchange;  Surgeon: Kathlynn Sharper, MD;  Location: [facility] ORS;  Service: Orthopedics;  Laterality: Right;   KYPHOPLASTY N/A 10/02/2023   Procedure: KYPHOPLASTY THORACIC TWELVE;  Surgeon:  Mavis Purchase, MD;  Location[facility];  Service: Neurosurgery;  Laterality: N/A;  KYPHOPLASTY THORACIC TWELVE   TONSILLECTOMY     as a child   TOTAL HIP ARTHROPLASTY  09/2009   Right femoral neck fracture   TOTAL HIP ARTHROPLASTY Left 03/06/2016   Procedure: TOTAL HIP ARTHROPLASTY ANTERIOR APPROACH;  Surgeon: Sharper Kathlynn, MD;  Location: [facility] ORS;  Service: Orthopedics;  Laterality: Left;   TOTAL KNEE ARTHROPLASTY Right 03/21/2021   Procedure: TOTAL KNEE ARTHROPLASTY;  Surgeon: Kathlynn Sharper, MD;  Location: [facility] OR[facility];  Service: Orthopedics;  Laterality: Right;   UMBILICAL HERNIA REPAIR  08/10/2021   Procedure: HERNIA REPAIR UMBILICAL ADULT;  Surgeon: Jordis Laneta FALCON, MD;  Location: [facility] ORS;  Service: General;;    Family History  Problem Relation Age of Onset   Cirrhosis Mother    Other Father        Lung Fibrosis   Prostate cancer Neg Hx    Kidney cancer Neg Hx    Bladder Cancer Neg Hx     No Known Allergies     Latest Ref Rng & Units 10/02/2023    6:59 AM 09/30/2023    9:12 AM 09/03/2023    3:39 PM  CBC  WBC 4.0 - 10.5 K/uL  5.3  6.6   Hemoglobin 13.0 - 17.0 g/dL 88.7  89.0  88.3   Hematocrit 39.0 - 52.0 % 33.0  33.2  34.9   Platelets 150 - 400 K/uL  208  227       CMP     Component Value Date/Time   NA 141 10/02/2023 0659   NA 139 02/04/2014 1449   K 3.5 10/02/2023 0659   K 4.6 02/04/2014 1449   CL 98 10/02/2023 0659   CL 105 02/04/2014 1449   CO2 30 09/30/2023 0912   CO2 27 02/04/2014 1449   GLUCOSE 104 (H) 10/02/2023 0659   GLUCOSE 84  02/04/2014 1449   BUN 22 10/02/2023 0659   BUN 23 (H) 02/04/2014 1449   CREATININE 3.60 (H) 10/02/2023 0659   CREATININE 3.47 (H) 09/03/2023 1539   CREATININE 1.25 02/04/2014 1449   CALCIUM  9.6 09/30/2023 0912   CALCIUM  8.7 02/04/2014 1449   PROT 7.5 09/03/2023 1539   ALBUMIN 4.1 09/03/2023 1539   AST 17 09/03/2023 1539   ALT 11 09/03/2023 1539   ALKPHOS 121 09/03/2023 1539   BILITOT 0.8 09/03/2023 1539   GFRNONAA 13 (L)  09/30/2023 0912   GFRNONAA 17 (L) 09/03/2023 1539   GFRNONAA >60 02/04/2014 1449     No results found.     Assessment & Plan:   1. ESRD (end stage renal disease) (HCC) (Primary) Recommend:  The patient is experiencing increasing problems with their dialysis access.  Patient should have a fistulagram with the intention for intervention.  The intention for intervention is to restore appropriate flow and prevent thrombosis and possible loss of the access.  As well as improve the quality of dialysis therapy.  The risks, benefits and alternative therapies were reviewed in detail with the patient.  All questions were answered.  The patient agrees to proceed with angio/intervention.    The patient will follow up with me in the office after the procedure.   2. Hyperlipidemia, unspecified hyperlipidemia type Continue statin as ordered and reviewed, no changes at this time   Current Outpatient Medications on File Prior to Visit  Medication Sig Dispense Refill   apixaban  (ELIQUIS ) 2.5 MG TABS tablet Take 1 tablet (2.5 mg total) by mouth 2 (two) times daily. 180 tablet 3   calcitRIOL  (ROCALTROL ) 0.25 MCG capsule Take 0.25 mcg by mouth daily.     carvedilol  (COREG ) 3.125 MG tablet Take 6.25 mg by mouth in the morning and at bedtime.     cholecalciferol  (VITAMIN D3) 25 MCG (1000 UNIT) tablet Take 1,000 Units by mouth daily.     cyanocobalamin  (VITAMIN B12) 1000 MCG tablet Take 1,000 mcg by mouth daily.     docusate sodium  (COLACE) 50 MG capsule Take 50 mg by mouth daily.     Multiple Vitamin (MULTIVITAMIN WITH MINERALS) TABS tablet Take 1 tablet by mouth daily. Taking a prescription multi for kidney patients     Multiple Vitamins-Minerals (PRESERVISION AREDS 2 PO) Take 1 capsule by mouth in the morning and at bedtime.     oxyCODONE -acetaminophen  (PERCOCET) 5-325 MG tablet Take 1 tablet by mouth every 6 (six) hours as needed. 20 tablet 0   rosuvastatin  (CRESTOR ) 10 MG tablet Take 10 mg by  mouth at bedtime.     tamsulosin  (FLOMAX ) 0.4 MG CAPS capsule Take 0.4 mg by mouth daily.     torsemide  (DEMADEX ) 20 MG tablet Take 2 tablets (40 mg total) by mouth daily. (Patient taking differently: Take 20 mg by mouth 2 (two) times daily.) 60 tablet 0   traZODone  (DESYREL ) 50 MG tablet Take 50 mg by mouth at bedtime as needed for sleep.     rosuvastatin  (CRESTOR ) 5 MG tablet Take 1 tablet (5 mg total) by mouth daily. (Patient not taking: Reported on 11/11/2023) 90 tablet 3   Saccharomyces boulardii (PROBIOTIC) 250 MG CAPS Take 1 capsule by mouth in the morning and at bedtime. (Patient not taking: Reported on 11/11/2023) 12 capsule 0   sevelamer  carbonate (RENVELA ) 800 MG tablet Take 1,600 mg by mouth 3 (three) times daily. (Patient not taking: Reported on 11/11/2023)     sevelamer  carbonate (RENVELA ) 800 MG tablet  Take 2 tablets (1,600 mg total) by mouth 3 (three) times daily with meals. (Patient not taking: Reported on 11/11/2023) 180 tablet 11   No current facility-administered medications on file prior to visit.    There are no Patient Instructions on file for this visit. No follow-ups on file.   Mahina Salatino E Marelin Tat, NP

## 2023-11-12 ENCOUNTER — Telehealth (INDEPENDENT_AMBULATORY_CARE_PROVIDER_SITE_OTHER): Payer: Self-pay

## 2023-11-12 NOTE — Telephone Encounter (Signed)
 Spoke with the patient and he is scheduled with Dr. Marea for a left arm fistulagram on 11/18/23 with a 11:30 am arrival time to the [facility]. Pre-procedure instructions were discussed and will be sent to Mychart and mailed.

## 2023-11-18 ENCOUNTER — Ambulatory Visit
Admission: RE | Admit: 2023-11-18 | Discharge: 2023-11-18 | Disposition: A | Discharge status: Home / Self Care | Attending: Vascular Surgery | Admitting: Vascular Surgery

## 2023-11-18 ENCOUNTER — Encounter
Admission: RE | Disposition: A | Discharge status: Home / Self Care | Payer: Self-pay | Source: Home / Self Care | Attending: Vascular Surgery

## 2023-11-18 ENCOUNTER — Encounter: Payer: Self-pay | Admitting: Vascular Surgery

## 2023-11-18 DIAGNOSIS — T82858A Stenosis of vascular prosthetic devices, implants and grafts, initial encounter: Secondary | ICD-10-CM | POA: Diagnosis not present

## 2023-11-18 DIAGNOSIS — N186 End stage renal disease: Secondary | ICD-10-CM | POA: Diagnosis not present

## 2023-11-18 DIAGNOSIS — T82898A Other specified complication of vascular prosthetic devices, implants and grafts, initial encounter: Secondary | ICD-10-CM

## 2023-11-18 DIAGNOSIS — I509 Heart failure, unspecified: Secondary | ICD-10-CM | POA: Diagnosis not present

## 2023-11-18 DIAGNOSIS — Z87891 Personal history of nicotine dependence: Secondary | ICD-10-CM | POA: Insufficient documentation

## 2023-11-18 DIAGNOSIS — E785 Hyperlipidemia, unspecified: Secondary | ICD-10-CM | POA: Insufficient documentation

## 2023-11-18 DIAGNOSIS — Z79899 Other long term (current) drug therapy: Secondary | ICD-10-CM | POA: Insufficient documentation

## 2023-11-18 DIAGNOSIS — Z992 Dependence on renal dialysis: Secondary | ICD-10-CM

## 2023-11-18 DIAGNOSIS — Y832 Surgical operation with anastomosis, bypass or graft as the cause of abnormal reaction of the patient, or of later complication, without mention of misadventure at the time of the procedure: Secondary | ICD-10-CM | POA: Insufficient documentation

## 2023-11-18 DIAGNOSIS — I132 Hypertensive heart and chronic kidney disease with heart failure and with stage 5 chronic kidney disease, or end stage renal disease: Secondary | ICD-10-CM | POA: Diagnosis not present

## 2023-11-18 HISTORY — PX: A/V FISTULAGRAM: CATH118298

## 2023-11-18 LAB — POTASSIUM (ARMC VASCULAR LAB ONLY): Potassium (ARMC vascular lab): 4.4 mmol/L (ref 3.5–5.1)

## 2023-11-18 SURGERY — A/V FISTULAGRAM
Anesthesia: Moderate Sedation | Laterality: Left

## 2023-11-18 MED ORDER — MIDAZOLAM HCL (PF) 2 MG/2ML IJ SOLN
INTRAMUSCULAR | Status: DC | PRN
  Administered 2023-11-18 (×2): 1 mg via INTRAVENOUS

## 2023-11-18 MED ORDER — FAMOTIDINE 20 MG PO TABS: 40.0000 mg | ORAL_TABLET | Freq: Once | ORAL | Status: DC | PRN

## 2023-11-18 MED ORDER — MIDAZOLAM HCL 2 MG/2ML IJ SOLN
INTRAMUSCULAR | Status: AC
  Filled 2023-11-18: qty 2

## 2023-11-18 MED ORDER — IODIXANOL 320 MG/ML IV SOLN
INTRAVENOUS | Status: DC | PRN
  Administered 2023-11-18: 25 mL

## 2023-11-18 MED ORDER — SODIUM CHLORIDE 0.9 % IV SOLN: INTRAVENOUS | Status: DC

## 2023-11-18 MED ORDER — MIDAZOLAM HCL 2 MG/ML PO SYRP: 8.0000 mg | ORAL_SOLUTION | Freq: Once | ORAL | Status: DC | PRN

## 2023-11-18 MED ORDER — FENTANYL CITRATE (PF) 100 MCG/2ML IJ SOLN
INTRAMUSCULAR | Status: DC | PRN
  Administered 2023-11-18: 25 ug via INTRAVENOUS
  Administered 2023-11-18: 50 ug via INTRAVENOUS
  Administered 2023-11-18: 25 ug via INTRAVENOUS

## 2023-11-18 MED ORDER — HEPARIN SODIUM (PORCINE) 10000 UNIT/ML IJ SOLN
INTRAMUSCULAR | Status: DC | PRN
  Administered 2023-11-18: 3000 [IU]

## 2023-11-18 MED ORDER — CEFAZOLIN SODIUM-DEXTROSE 1-4 GM/50ML-% IV SOLN
INTRAVENOUS | Status: AC
Start: 2023-11-18 — End: 2023-11-18
  Filled 2023-11-18: qty 50

## 2023-11-18 MED ORDER — FENTANYL CITRATE (PF) 100 MCG/2ML IJ SOLN
INTRAMUSCULAR | Status: AC
  Filled 2023-11-18: qty 2

## 2023-11-18 MED ORDER — METHYLPREDNISOLONE SODIUM SUCC 125 MG IJ SOLR: 125.0000 mg | Freq: Once | INTRAMUSCULAR | Status: DC | PRN

## 2023-11-18 MED ORDER — HEPARIN SODIUM (PORCINE) 1000 UNIT/ML IJ SOLN
INTRAMUSCULAR | Status: AC
Start: 2023-11-18 — End: 2023-11-18
  Filled 2023-11-18: qty 10

## 2023-11-18 MED ORDER — LIDOCAINE-EPINEPHRINE (PF) 1 %-1:200000 IJ SOLN
INTRAMUSCULAR | Status: DC | PRN
  Administered 2023-11-18: 10 mL

## 2023-11-18 MED ORDER — DIPHENHYDRAMINE HCL 50 MG/ML IJ SOLN: 50.0000 mg | Freq: Once | INTRAMUSCULAR | Status: DC | PRN

## 2023-11-18 MED ORDER — HYDROMORPHONE HCL 1 MG/ML IJ SOLN: 1.0000 mg | Freq: Once | INTRAMUSCULAR | Status: DC | PRN

## 2023-11-18 MED ORDER — CEFAZOLIN SODIUM-DEXTROSE 1-4 GM/50ML-% IV SOLN
1.0000 g | INTRAVENOUS | Status: AC
Start: 2023-11-18 — End: 2023-11-18
  Administered 2023-11-18: 1 g via INTRAVENOUS

## 2023-11-18 MED ORDER — HEPARIN (PORCINE) IN NACL 1000-0.9 UT/500ML-% IV SOLN
INTRAVENOUS | Status: DC | PRN
  Administered 2023-11-18: 500 mL

## 2023-11-18 MED ORDER — ONDANSETRON HCL 4 MG/2ML IJ SOLN: 4.0000 mg | Freq: Four times a day (QID) | INTRAMUSCULAR | Status: DC | PRN

## 2023-11-18 SURGICAL SUPPLY — 11 items
BALLOON LUTONIX 7X220X130 (BALLOONS) IMPLANT
COVER PROBE ULTRASOUND 5X96 (MISCELLANEOUS) IMPLANT
DEVICE PRESTO INFLATION (MISCELLANEOUS) IMPLANT
DRAPE BRACHIAL (DRAPES) IMPLANT
KIT MICROPUNCTURE VSI 5F STIFF (SHEATH) IMPLANT
NDL ENTRY 21GA 7CM ECHOTIP (NEEDLE) IMPLANT
NEEDLE ENTRY 21GA 7CM ECHOTIP (NEEDLE) ×1 IMPLANT
PACK ANGIOGRAPHY (CUSTOM PROCEDURE TRAY) ×1 IMPLANT
SHEATH BRITE TIP 6FRX5.5 (SHEATH) IMPLANT
SUT MNCRL AB 4-0 PS2 18 (SUTURE) IMPLANT
WIRE SUPRACORE 190CM (WIRE) IMPLANT

## 2023-11-18 NOTE — Interval H&P Note (Signed)
 History and Physical Interval Note:  11/18/2023 11:29 AM  Lawrence Perkins  has presented today for surgery, with the diagnosis of L Arm Fistulagram    End Stage Renal.  The various methods of treatment have been discussed with the patient and family. After consideration of risks, benefits and other options for treatment, the patient has consented to  Procedure(s): A/V Fistulagram (Left) as a surgical intervention.  The patient's history has been reviewed, patient examined, no change in status, stable for surgery.  I have reviewed the patient's chart and labs.  Questions were answered to the patient's satisfaction.     Lakeysha Slutsky

## 2023-11-18 NOTE — Op Note (Signed)
 [facility]    OPERATIVE NOTE   PROCEDURE: 1.   Left brachiocephalic arteriovenous fistula cannulation under ultrasound guidance 2.   Left arm fistulagram including central venogram 3.   Percutaneous transluminal angioplasty of the proximal and mid left upper arm cephalic vein with 7 mm diameter by 22 cm length Lutonix drug-coated angioplasty balloon  PRE-OPERATIVE DIAGNOSIS: 1. ESRD 2. Poorly functional left brachiocephalic AVF  POST-OPERATIVE DIAGNOSIS: same as above   SURGEON: Selinda Gu, MD  ANESTHESIA: local with MCS  ESTIMATED BLOOD LOSS: 3 cc  FINDING(S): Left brachiocephalic AV fistula with a stenosis at the access site within the previously placed stent of about 75 to 80% and then a napkin ringlike stenosis at the proximal upper arm cephalic vein at the proximal edge of the previously placed stent in the 80% range as well.  Central venous circulation had mild narrowing in the left innominate vein but nothing that appeared significant.  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  SPECIMEN(S):  None  CONTRAST: 25 cc  FLUORO TIME: 2.1 minutes  MODERATE CONSCIOUS SEDATION TIME: Approximately 39 minutes with 2 mg of Versed  and 100 mcg of Fentanyl    INDICATIONS: Lawrence Perkins is a 83 y.o. male who presents with malfunctioning left brachiocephalic arteriovenous fistula.  The patient is scheduled for left arm fistulagram.  The patient is aware the risks include but are not limited to: bleeding, infection, thrombosis of the cannulated access, and possible anaphylactic reaction to the contrast.  The patient is aware of the risks of the procedure and elects to proceed forward.  DESCRIPTION: After full informed written consent was obtained, the patient was brought back to the angiography suite and placed supine upon the  angiography table.  The patient was connected to monitoring equipment. Moderate conscious sedation was administered with a face to face encounter with the patient throughout the procedure with my supervision of the RN administering medicines and monitoring the patient's vital signs and mental status throughout from the start of the procedure until the patient was taken to the recovery room. The left arm was prepped and draped in the standard fashion for a percutaneous access intervention.  Under ultrasound guidance, the left brachiocephalic arteriovenous fistula was cannulated with a micropuncture needle under direct ultrasound guidance where it was patent and a permanent image was performed.  The microwire was advanced into the fistula and the needle was exchanged for the a microsheath.  I then upsized to a 6 Fr Sheath and imaging was performed.  Hand injections were completed to image the access including the central venous system. This demonstrated a left brachiocephalic AV fistula with a stenosis at the access site within the previously placed stent of about 75 to 80% and then a napkin ringlike stenosis at the proximal upper arm cephalic vein at the proximal edge of the previously placed stent in the 80% range as well.  Central venous circulation had mild narrowing in the left innominate vein but nothing that appeared significant.  There was also mild narrowing near the anastomosis as well as a small aneurysmal segment about 4 to 5 cm from the anastomosis but nothing that was altering flow at this point..  Based on the images, this patient will need intervention to both these areas of stenosis.  I felt we could likely get both of these areas with  a fairly long drug-coated balloon. I then gave the patient 3000 units of intravenous heparin .  I then crossed the stenosis with a Supracore wire.  Based on the imaging, a 7 mm x 22 cm Lutonix drug-coated angioplasty balloon was selected.  The balloon was centered  around the proximal and mid upper arm cephalic vein stenoses and inflated to 12 ATM for 1 minute(s).  On completion imaging, a 20-25% residual stenosis was present at the more distal part within the stent and only about a 10% residual stenosis at the proximal edge of the previously placed stent.     Based on the completion imaging, no further intervention is necessary.  The wire and balloon were removed from the sheath.  A 4-0 Monocryl purse-string suture was sewn around the sheath.  The sheath was removed while tying down the suture.  A sterile bandage was applied to the puncture site.  COMPLICATIONS: None  CONDITION: Stable   Selinda Gu  11/18/2023 1:42 PM   This note was created with Dragon Medical transcription system. Any errors in dictation are purely unintentional.

## 2023-11-26 DIAGNOSIS — R053 Chronic cough: Secondary | ICD-10-CM | POA: Diagnosis not present

## 2023-12-01 DIAGNOSIS — N186 End stage renal disease: Secondary | ICD-10-CM | POA: Diagnosis not present

## 2023-12-01 DIAGNOSIS — Z992 Dependence on renal dialysis: Secondary | ICD-10-CM | POA: Diagnosis not present

## 2023-12-11 DIAGNOSIS — M5416 Radiculopathy, lumbar region: Secondary | ICD-10-CM | POA: Diagnosis not present

## 2023-12-12 DIAGNOSIS — S32040A Wedge compression fracture of fourth lumbar vertebra, initial encounter for closed fracture: Secondary | ICD-10-CM | POA: Diagnosis not present

## 2023-12-12 DIAGNOSIS — M810 Age-related osteoporosis without current pathological fracture: Secondary | ICD-10-CM | POA: Diagnosis not present

## 2023-12-23 ENCOUNTER — Other Ambulatory Visit (INDEPENDENT_AMBULATORY_CARE_PROVIDER_SITE_OTHER): Payer: Self-pay | Admitting: Vascular Surgery

## 2023-12-23 DIAGNOSIS — N186 End stage renal disease: Secondary | ICD-10-CM

## 2023-12-30 ENCOUNTER — Ambulatory Visit (INDEPENDENT_AMBULATORY_CARE_PROVIDER_SITE_OTHER): Admitting: Nurse Practitioner

## 2023-12-30 ENCOUNTER — Encounter (INDEPENDENT_AMBULATORY_CARE_PROVIDER_SITE_OTHER): Payer: Self-pay | Admitting: Nurse Practitioner

## 2023-12-30 ENCOUNTER — Ambulatory Visit (INDEPENDENT_AMBULATORY_CARE_PROVIDER_SITE_OTHER)

## 2023-12-30 VITALS — BP 156/87 | HR 83 | Resp 18 | Ht 70.0 in | Wt 169.0 lb

## 2023-12-30 DIAGNOSIS — N186 End stage renal disease: Secondary | ICD-10-CM

## 2023-12-30 DIAGNOSIS — R200 Anesthesia of skin: Secondary | ICD-10-CM | POA: Diagnosis not present

## 2023-12-30 DIAGNOSIS — E785 Hyperlipidemia, unspecified: Secondary | ICD-10-CM

## 2023-12-30 DIAGNOSIS — R202 Paresthesia of skin: Secondary | ICD-10-CM

## 2023-12-30 NOTE — Progress Notes (Signed)
 "  Subjective:    Patient ID: Lawrence Perkins, male    DOB: February 08, 1940, 83 y.o.   MRN: [id number] Chief Complaint  Patient presents with   Follow-up    6 week Follow up HDA    HPI  Discussed the use of AI scribe software for clinical note transcription with the patient, who gave verbal consent to proceed.  History of Present Illness Lawrence Perkins is an 83 year old male with end stage renal disease on hemodialysis via left upper extremity arteriovenous fistula who presents for vascular surgery follow-up after recent fistula intervention.  He underwent a left upper extremity arteriovenous fistula intervention several weeks ago. Since the procedure, he has not experienced bleeding or pain in the hand or arm.  He reports intermittent episodes of numbness and tingling at the fingertips of both hands, described as his fingers going to sleep. These episodes most commonly occur while driving with both hands on the wheel and occasionally during dialysis sessions. He denies associated pain, motor deficits, or persistent symptoms. The symptoms are not present during every dialysis session.  He has a history of back problems, which may be relevant to the bilateral nature of his symptoms.  He has not experienced issues with dialysis access, including excessive alarms or bleeding during needle removal.    Results Diagnostic Upper extremity arteriovenous fistula ultrasound (12/30/2023): No evidence of steal syndrome. Flow volume 1175. Area of stenosis at shoulder near stent placement. Flow volume was 1808 on prior study. Fistulogram: Areas of stenosis treated.   Review of Systems  Neurological:  Positive for numbness.  All other systems reviewed and are negative.      Objective:   Physical Exam Vitals reviewed.  HENT:     Head: Normocephalic.  Cardiovascular:     Rate and Rhythm: Normal rate.     Pulses:          Radial pulses are 2+ on the left side.     Arteriovenous access:  Left arteriovenous access is present.     Comments: Good thrill and bruit Pulmonary:     Effort: Pulmonary effort is normal.  Skin:    General: Skin is warm and dry.  Neurological:     Mental Status: He is alert and oriented to person, place, and time.  Psychiatric:        Mood and Affect: Mood normal.        Behavior: Behavior normal.        Thought Content: Thought content normal.        Judgment: Judgment normal.     Physical Exam CARDIOVASCULAR: Strong bruit throughout. EXTREMITIES: Good pulse, hand warm, extremity feels strong.  BP (!) 156/87 (BP Location: Right Arm, Patient Position: Sitting, Cuff Size: Normal)   Pulse 83   Resp 18   Ht 5' 10 (1.778 m)   Wt 169 lb (76.7 kg)   BMI 24.25 kg/m   Past Medical History:  Diagnosis Date   Anemia    Aortic atherosclerosis    Arthritis    Atrial fibrillation and flutter (HCC)    a.) CHA2DS2-VASc = 5 (age x 2, CHF, HTN, aortic plaque). b.) rate/rhythm maintain on oral carvedilol ; chronically anticoagulated with dose reduced apixaban    Bilateral inguinal hernia    BPH (benign prostatic hyperplasia)    Cardiac syncope    Cardiomyopathy (HCC)    a.) TTE 04/23/2016: EF 40%. b.) TTE 09/03/2016: EF 50%. c.) TTE 05/04/2019: EF >55%.   CHF (congestive heart failure) (HCC)  a.) TTE 04/23/2016: EF 40%; BAE; mild AR/TR, mod MR; G1DD. b.) TTE 09/03/2016: EF 50%; mild LVH; BAE; triv AR/PR, mod MR/TR. 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   Melanoma of  back (HCC)    a.) s/p resection   MGUS (monoclonal gammopathy of unknown significance)    Pleural effusion    Rectal bleeding    Secondary hyperparathyroidism of renal origin     Social History   Socioeconomic History   Marital status: Married    Spouse name: Elveria   Number of children: Not on file   Years of education: Not on file   Highest education level: Not on file  Occupational History   Occupation: Retired  Tobacco Use   Smoking status: Former    Current packs/day: 0.00    Types: Cigarettes    Start date: 01/01/1966    Quit date: 01/01/1981    Years since quitting: 43.0    Passive exposure: Past   Smokeless tobacco: Never  Vaping Use   Vaping status: Never Used  Substance and Sexual Activity   Alcohol use: Not Currently   Drug use: No   Sexual activity: Yes    Birth control/protection: None  Other Topics Concern   Not on file  Social History Narrative   Regular exercise: Yes   Social Drivers of Health   Tobacco Use: Medium Risk (12/30/2023)   Patient History    Smoking Tobacco Use: Former    Smokeless Tobacco Use: Never    Passive Exposure: Past  Physicist, Medical Strain: Low Risk  (12/11/2023)   Received from [facility]   Overall Financial Resource Strain (CARDIA)    Difficulty of Paying Living Expenses: Not hard at all  Food Insecurity: No Food Insecurity (12/11/2023)   Received from [facility]   Epic    Within the past 12 months, you worried that your food would run out before you got the money to buy more.: Never true    Within the past 12 months, the food you bought just didn't last and you didn't have money to get more.: Never true  Transportation Needs: No Transportation Needs (12/11/2023)   Received from [facility] - Transportation    In the past 12 months, has lack of transportation kept you from medical appointments or from getting medications?: No    Lack of Transportation  (Non-Medical): No  Physical Activity: Not on file  Stress: Not on file  Social Connections: Not on file  Intimate Partner Violence: Not At Risk (02/02/2022)   Humiliation, Afraid, Rape, and Kick questionnaire    Fear of Current or Ex-Partner: No    Emotionally Abused: No    Physically Abused: No    Sexually Abused: No  Depression (PHQ2-9): Low Risk (09/09/2023)   Depression (PHQ2-9)    PHQ-2 Score: 0  Alcohol Screen: Not on file  Housing: Low Risk  (12/11/2023)   Received from [facility]   Epic    In the last 12 months, was there a time when you were not able to pay the mortgage or rent on time?: No    In the past 12 months, how many times have you moved where you were living?: 0    At any time in the past 12 months, were you homeless or living in a shelter (including now)?: No  Utilities: Not At Risk (12/11/2023)   Received from [facility]   Epic    In the past 12 months has the electric, gas, oil, or water company threatened to shut off services in your home?: No  Health Literacy: Not on file    Past Surgical History:  Procedure Laterality Date   A/V FISTULAGRAM Left 06/11/2022   Procedure: A/V Fistulagram;  Surgeon: Marea Selinda RAMAN, MD;  Location: [facility];  Service: Cardiovascular;  Laterality: Left;   A/V FISTULAGRAM Left 10/02/2022   Procedure: A/V Fistulagram;  Surgeon: Marea Selinda RAMAN, MD;  Location: [facility];  Service: Cardiovascular;  Laterality: Left;   A/V FISTULAGRAM Left 06/17/2023   Procedure: A/V Fistulagram;  Surgeon: Marea Selinda RAMAN, MD;  Location: [facility];  Service: Cardiovascular;  Laterality: Left;   A/V FISTULAGRAM Left 11/18/2023   Procedure: A/V Fistulagram;  Surgeon: Marea Selinda RAMAN, MD;  Location: [facility];  Service: Cardiovascular;  Laterality: Left;   ANKLE SURGERY Right 1985   following volleyball injury   APPENDECTOMY     at the of 9 yrs   AV FISTULA PLACEMENT Left 01/04/2022    Procedure: ARTERIOVENOUS (AV) FISTULA CREATION;  Surgeon: Marea Selinda RAMAN, MD;  Location: [facility] OR[facility];  Service: Vascular;  Laterality: Left;   CAPD INSERTION N/A 08/10/2021   Procedure: LAPAROSCOPIC INSERTION CONTINUOUS AMBULATORY PERITONEAL DIALYSIS  (CAPD) CATHETER;  Surgeon: Jordis Laneta FALCON, MD;  Location: [facility];  Service: General;  Laterality: N/A;   CAPD REVISION N/A 09/21/2021   Procedure: LAPAROSCOPIC REVISION CONTINUOUS AMBULATORY PERITONEAL DIALYSIS  (CAPD) CATHETER;  Surgeon: Jordis Laneta FALCON, MD;  Location: [facility];  Service: General;  Laterality: N/A;   CATARACT EXTRACTION Left    COLONOSCOPY     COLONOSCOPY WITH PROPOFOL  N/A 01/02/2017   Procedure: COLONOSCOPY WITH PROPOFOL ;  Surgeon: Viktoria Lamar DASEN, MD;  Location: [facility] ENDOSCOPY;  Service: Endoscopy;  Laterality: N/A;   DIALYSIS/PERMA CATHETER INSERTION N/A 03/27/2021   Procedure: DIALYSIS/PERMA CATHETER INSERTION;  Surgeon: Marea Selinda RAMAN, MD;  Location: [facility];  Service: Cardiovascular;  Laterality: N/A;   DIALYSIS/PERMA CATHETER INSERTION N/A 10/10/2021   Procedure: DIALYSIS/PERMA CATHETER INSERTION;  Surgeon: Jama Cordella MATSU, MD;  Location: [facility];  Service: Cardiovascular;  Laterality: N/A;   DIALYSIS/PERMA CATHETER REMOVAL N/A 05/08/2021   Procedure: DIALYSIS/PERMA CATHETER REMOVAL;  Surgeon: Marea Selinda RAMAN, MD;  Location: [facility];  Service: Cardiovascular;  Laterality: N/A;   DIALYSIS/PERMA CATHETER REMOVAL N/A 04/17/2022   Procedure: DIALYSIS/PERMA CATHETER REMOVAL;  Surgeon: Jama Cordella MATSU, MD;  Location: [facility];  Service: Cardiovascular;  Laterality: N/A;   INSERTION OF MESH  08/10/2021   Procedure: INSERTION OF MESH;  Surgeon: Jordis Laneta FALCON, MD;  Location: [facility];  Service: General;;   JOINT REPLACEMENT     RIGHT TOTAL HIP   KNEE ARTHROSCOPY WITH LATERAL RELEASE Right 04/20/2021   Procedure: Right knee medial retinaculum and lateral release with  polyethylene  exchange;  Surgeon: Kathlynn Sharper, MD;  Location: [facility];  Service: Orthopedics;  Laterality: Right;   KYPHOPLASTY N/A 10/02/2023   Procedure: KYPHOPLASTY THORACIC TWELVE;  Surgeon: Mavis Purchase, MD;  Location: [facility] OR;  Service: Neurosurgery;  Laterality: N/A;  KYPHOPLASTY THORACIC TWELVE   TONSILLECTOMY     as a child   TOTAL HIP ARTHROPLASTY  09/2009   Right femoral neck fracture   TOTAL HIP ARTHROPLASTY Left 03/06/2016   Procedure: TOTAL HIP ARTHROPLASTY ANTERIOR APPROACH;  Surgeon: Sharper Kathlynn, MD;  Location: [facility];  Service: Orthopedics;  Laterality: Left;   TOTAL KNEE ARTHROPLASTY Right 03/21/2021   Procedure: TOTAL KNEE ARTHROPLASTY;  Surgeon: Kathlynn Sharper, MD;  Location: [facility];  Service: Orthopedics;  Laterality: Right;   UMBILICAL HERNIA REPAIR  08/10/2021   Procedure: HERNIA REPAIR UMBILICAL ADULT;  Surgeon: Jordis Laneta FALCON, MD;  Location: [facility];  Service: General;;    Family History  Problem Relation Age of Onset   Cirrhosis Mother    Other Father        Lung Fibrosis   Prostate cancer Neg Hx    Kidney cancer Neg Hx    Bladder Cancer Neg Hx     Allergies[1]     Latest Ref Rng & Units 10/02/2023    6:59 AM 09/30/2023    9:12 AM 09/03/2023    3:39 PM  CBC  WBC 4.0 - 10.5 K/uL  5.3  6.6   Hemoglobin 13.0 - 17.0 g/dL 88.7  89.0  88.3   Hematocrit 39.0 - 52.0 % 33.0  33.2  34.9   Platelets 150 - 400 K/uL  208  227       CMP     Component Value Date/Time   NA 141 10/02/2023 0659   NA 139 02/04/2014 1449   K 3.5 10/02/2023 0659   K 4.6 02/04/2014 1449   CL 98 10/02/2023 0659   CL 105 02/04/2014 1449   CO2 30 09/30/2023 0912   CO2 27 02/04/2014 1449   GLUCOSE 104 (H) 10/02/2023 0659   GLUCOSE 84 02/04/2014 1449   BUN 22 10/02/2023 0659   BUN 23 (H) 02/04/2014 1449   CREATININE 3.60 (H) 10/02/2023 0659   CREATININE 3.47 (H) 09/03/2023 1539   CREATININE 1.25 02/04/2014 1449   CALCIUM  9.6 09/30/2023 0912   CALCIUM  8.7 02/04/2014 1449   PROT 7.5  09/03/2023 1539   ALBUMIN 4.1 09/03/2023 1539   AST 17 09/03/2023 1539   ALT 11 09/03/2023 1539   ALKPHOS 121 09/03/2023 1539   BILITOT 0.8 09/03/2023 1539   GFRNONAA 13 (L) 09/30/2023 0912   GFRNONAA 17 (L) 09/03/2023 1539   GFRNONAA >60 02/04/2014 1449     No results found.     Assessment & Plan:   1. ESRD (end stage renal disease) (HCC) (Primary) End stage renal disease with arteriovenous dialysis fistula End stage renal disease on hemodialysis via left upper extremity arteriovenous fistula. No bleeding, pain, or access dysfunction. Intermittent mild paresthesia likely positional or due to nerve impingement. 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 Decreased but adequate flow volume. No acute complications or need for intervention. - Assessed fistula function: good pulse, warmth, strong thrill, and bruit. - Reviewed ultrasound: no steal syndrome, decreased flow volume without critical stenosis. - Advised monitoring for increased symptoms, pain, bleeding, or access dysfunction and to report changes. - Discussed that worsening or painful symptoms warrant further evaluation with angiography. - Recommended continued use of fistula for dialysis and observation of  small aneurysm; no intervention unless painful or problematic. - Scheduled follow-up in three months to reassess fistula function and symptoms. - VAS US  DUPLEX DIALYSIS ACCESS (AVF, AVG); Future  2. Numbness and tingling in both hands The patient's numbness and tingling is sporadic and only occurs in his fingertips.  In addition includes both of his hands not just his left upper extremity.  The studies today did not indicate steal syndrome so we will monitor but I suspect this is more nerve involvement and then steal syndrome.  3. Hyperlipidemia, unspecified hyperlipidemia type Continue statin as ordered and reviewed, no changes at this time   Assessment and Plan Assessment & Plan      Medications Ordered Prior to  Encounter[2]  There are no Patient Instructions on file for this visit. Return in about 3 months (around 03/29/2024) for with HDA see JD/FB.   Anhad Sheeley E Laranda Burkemper, NP       [1] No Known Allergies [2]  Current Outpatient Medications on File Prior to Visit  Medication Sig Dispense Refill   apixaban  (ELIQUIS ) 2.5 MG TABS tablet Take 1 tablet (2.5 mg total) by mouth 2 (two) times daily. 180 tablet 3   calcitRIOL  (ROCALTROL ) 0.25 MCG capsule Take 0.25 mcg by mouth daily.     carvedilol  (COREG ) 3.125 MG tablet Take 6.25 mg by mouth in the morning and at bedtime.     cholecalciferol  (VITAMIN D3) 25 MCG (1000 UNIT) tablet Take 1,000 Units by mouth daily.     cyanocobalamin  (VITAMIN B12) 1000 MCG tablet Take 1,000 mcg by mouth daily.     docusate sodium  (COLACE) 50 MG capsule Take 50 mg by mouth daily.     Multiple Vitamin (MULTIVITAMIN WITH MINERALS) TABS tablet Take 1 tablet by mouth daily. Taking a prescription multi for kidney patients     Multiple Vitamins-Minerals (PRESERVISION AREDS 2 PO) Take 1 capsule by mouth in the morning and at bedtime.     oxyCODONE -acetaminophen  (PERCOCET) 5-325 MG tablet Take 1 tablet by mouth every 6 (six) hours as needed. 20 tablet 0   rosuvastatin  (CRESTOR ) 10 MG tablet Take 10 mg by mouth at bedtime.     rosuvastatin  (CRESTOR ) 5 MG tablet Take 1 tablet (5 mg total) by mouth daily. 90 tablet 3   tamsulosin  (FLOMAX ) 0.4 MG CAPS capsule Take 0.4 mg by mouth daily.     torsemide  (DEMADEX ) 20 MG tablet Take 2 tablets (40 mg total) by mouth daily. (Patient taking differently: Take 20 mg by mouth 2 (two) times daily.) 60 tablet 0   traZODone  (DESYREL ) 50 MG tablet Take 50 mg by mouth at bedtime as needed for sleep.     Saccharomyces boulardii (PROBIOTIC) 250 MG CAPS Take 1 capsule by mouth in the morning and at bedtime. (Patient not taking: Reported on 12/30/2023) 12 capsule 0   sevelamer  carbonate (RENVELA ) 800 MG tablet Take 1,600 mg by mouth 3 (three) times daily.  (Patient not taking: Reported on 12/30/2023)     sevelamer  carbonate (RENVELA ) 800 MG tablet Take 2 tablets (1,600 mg total) by mouth 3 (three) times daily with meals. (Patient not taking: Reported on 12/30/2023) 180 tablet 11   No current facility-administered medications on file prior to visit.   "

## 2024-01-07 NOTE — Progress Notes (Unsigned)
 "    01/24/2018 1:39 PM   Lawrence Perkins 09/22/1940 [id number]  Referring provider: Lenon Layman ORN, MD [address] [facility] [place],  [place] [address]  Urological history 1. BPH with LU TS - PSA 0.95 in 2016 - discontinued screening due to age -cysto (03/2020) -moderate size prostate with obstructing lateral lobes with moderate bladder trabeculations -prostate volume 40 grms - CT, 2022 - managed with tamsulosin  0.4 mg daily  2. High risk hematuria -former smoker -non-contrast CT, 2022 (no contrast due to CKD) - no worrisome findings -cysto, 2022 - subtle dilation of ejaculatory duct but no definite papillary tumors -urine cytology, 2022 - NED -RUS (06/2022) -bilateral renal cysts -cysto (06/2022) - NED  -contrast CT (08/2023) benign bilateral cysts -Lumbar MRI (09/2023) 0.6 cm right kidney lesion -RUS (10/2023) lesion too small to characterize, recommend repeat ultrasound in 6 months  3. Nephrolithiasis -2 mm right lower pole stone on 2023 CT  4. Renal cyst -Exophytic right lower pole renal cyst which appears to be involuting is slightly decreased in size compared to prior, now measuring 1.8 x 1.1 cm previously 1.8 x 1.5 cm on 2022 CT -non contrast CT 2023 Bilateral renal cysts are present measuring up to 17 mm -RUS (10/2023) bilateral renal cysts, consider repeat ultrasound in 6 months   No chief complaint on file.   HPI: Lawrence Perkins is a 84 y.o. male who presents today for 23-month follow-up.  Previous records reviewed.     I PSS ***  He reports sensation of incomplete bladder emptying,   urinary frequency,   urinary intermittency,   urinary urgency,   a weak urinary stream,   having to strain to void,   nocturia x ***,   leaking before being able to reach the restroom,   leaking with coughing,   leaking without awareness,   and post void dribbling.     He is wearing *** pads//depends  daily.    Patient denies any  modifying or aggravating factors.  Patient denies any recent UTI's, gross hematuria, dysuria or suprapubic/flank pain.  Patient denies any fevers, chills, nausea or vomiting.  ***  He has a family history of PCa, colon cancer, ovarian cancer and/or breast cancer with ***.   He does not have a family history of PCa, colon cancer, ovarian cancer, and/or breast cancer .***     UA***  Serum creatinine (10/2023) 4.0, on dialysis  Hemoglobin A1c (10/2023) 5.3    PMH: Past Medical History:  Diagnosis Date   Anemia    Aortic atherosclerosis    Arthritis    Atrial fibrillation and flutter (HCC)    a.) CHA2DS2-VASc = 5 (age x 2, CHF, HTN, aortic plaque). b.) rate/rhythm maintain on oral carvedilol ; chronically anticoagulated with dose reduced apixaban    Bilateral inguinal hernia    BPH (benign prostatic hyperplasia)    Cardiac syncope    Cardiomyopathy (HCC)    a.) TTE 04/23/2016: EF 40%. b.) TTE 09/03/2016: EF 50%. c.) TTE 05/04/2019: EF >55%.   CHF (congestive heart failure) (HCC)    a.) TTE 04/23/2016: EF 40%; BAE; mild AR/TR, mod MR; G1DD. b.) TTE 09/03/2016: EF 50%; mild LVH; BAE; triv AR/PR, mod MR/TR. 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HCC)    a.) s/p resection   MGUS (monoclonal gammopathy of unknown significance)    Pleural effusion    Rectal bleeding    Secondary hyperparathyroidism of renal origin     Surgical History: Past Surgical History:  Procedure Laterality Date    A/V FISTULAGRAM Left 06/11/2022   Procedure: A/V Fistulagram;  Surgeon: Marea Selinda RAMAN, MD;  Location: [facility];  Service: Cardiovascular;  Laterality: Left;   A/V FISTULAGRAM Left 10/02/2022   Procedure: A/V Fistulagram;  Surgeon: Marea Selinda RAMAN, MD;  Location: [facility];  Service: Cardiovascular;  Laterality: Left;   A/V FISTULAGRAM Left 06/17/2023   Procedure: A/V Fistulagram;  Surgeon: Marea Selinda RAMAN, MD;  Location: [facility];  Service: Cardiovascular;  Laterality: Left;   A/V FISTULAGRAM Left 11/18/2023   Procedure: A/V Fistulagram;  Surgeon: Marea Selinda RAMAN, MD;  Location: [facility];  Service: Cardiovascular;  Laterality: Left;   ANKLE SURGERY Right 1985   following volleyball injury   APPENDECTOMY     at the of 9 yrs   AV FISTULA PLACEMENT Left 01/04/2022   Procedure: ARTERIOVENOUS (AV) FISTULA CREATION;  Surgeon: Marea Selinda RAMAN, MD;  Location: [facility];  Service: Vascular;  Laterality: Left;   CAPD INSERTION N/A 08/10/2021   Procedure: LAPAROSCOPIC INSERTION CONTINUOUS AMBULATORY PERITONEAL DIALYSIS  (CAPD) CATHETER;  Surgeon: Jordis Laneta FALCON, MD;  Location: [facility];  Service: General;  Laterality: N/A;   CAPD REVISION N/A 09/21/2021   Procedure: LAPAROSCOPIC REVISION CONTINUOUS AMBULATORY PERITONEAL DIALYSIS  (CAPD) CATHETER;  Surgeon: Jordis Laneta FALCON, MD;  Location: [facility];  Service: General;  Laterality: N/A;   CATARACT EXTRACTION Left    COLONOSCOPY     COLONOSCOPY WITH PROPOFOL  N/A 01/02/2017   Procedure: COLONOSCOPY WITH PROPOFOL ;  Surgeon: Viktoria Lamar DASEN, MD;  Location[facility];  Service: Endoscopy;  Laterality: N/A;   DIALYSIS/PERMA CATHETER INSERTION N/A 03/27/2021   Procedure: DIALYSIS/PERMA CATHETER INSERTION;  Surgeon: Marea Selinda RAMAN, MD;  Location: [facility];  Service: Cardiovascular;  Laterality: N/A;   DIALYSIS/PERMA CATHETER INSERTION N/A 10/10/2021   Procedure: DIALYSIS/PERMA CATHETER INSERTION;  Surgeon: Jama Cordella MATSU, MD;  Location: [facility];  Service: Cardiovascular;  Laterality: N/A;   DIALYSIS/PERMA CATHETER REMOVAL N/A 05/08/2021   Procedure: DIALYSIS/PERMA CATHETER REMOVAL;  Surgeon: Marea Selinda RAMAN, MD;  Location: [facility];  Service: Cardiovascular;  Laterality: N/A;   DIALYSIS/PERMA CATHETER REMOVAL N/A 04/17/2022   Procedure: DIALYSIS/PERMA CATHETER REMOVAL;  Surgeon: Jama Cordella MATSU, MD;  Location: [facility];  Service: Cardiovascular;  Laterality: N/A;   INSERTION OF MESH  08/10/2021   Procedure: INSERTION OF MESH;  Surgeon: Jordis Laneta FALCON, MD;  Location: [facility];  Service: General;;   JOINT REPLACEMENT     RIGHT TOTAL HIP   KNEE ARTHROSCOPY WITH LATERAL RELEASE Right 04/20/2021   Procedure: Right knee medial retinaculum and lateral release with polyethylene exchange;  Surgeon: Kathlynn Sharper, MD;  Location: [facility];  Service: Orthopedics;  Laterality: Right;   KYPHOPLASTY N/A 10/02/2023   Procedure: KYPHOPLASTY THORACIC TWELVE;  Surgeon: Mavis Purchase, MD;  Location: [facility];  Service: Neurosurgery;  Laterality: N/A;  KYPHOPLASTY THORACIC TWELVE   TONSILLECTOMY     as a child   TOTAL HIP ARTHROPLASTY  09/2009   Right femoral neck fracture   TOTAL HIP ARTHROPLASTY Left 03/06/2016   Procedure: TOTAL HIP ARTHROPLASTY ANTERIOR APPROACH;  Surgeon: Lawrence Flake, MD;  Location: [facility];  Service: Orthopedics;  Laterality: Left;   TOTAL KNEE ARTHROPLASTY Right 03/21/2021   Procedure: TOTAL KNEE ARTHROPLASTY;  Surgeon: Flake Ozell, MD;  Location: [facility];  Service: Orthopedics;  Laterality: Right;   UMBILICAL HERNIA REPAIR  08/10/2021   Procedure: HERNIA REPAIR UMBILICAL ADULT;  Surgeon: Jordis Laneta FALCON, MD;  Location: [facility];  Service: General;;    Home Medications:  Allergies as of 01/08/2024   No Known Allergies      Medication List        Accurate as of January 07, 2024  1:39 PM. If you have any questions, ask your nurse or doctor.           calcitRIOL  0.25 MCG capsule Commonly known as: ROCALTROL  Take 0.25 mcg by mouth daily.   carvedilol  3.125 MG tablet Commonly known as: COREG  Take 6.25 mg by mouth in the morning and at bedtime.   cholecalciferol  25 MCG (1000 UNIT) tablet Commonly known as: VITAMIN D3 Take 1,000 Units by mouth daily.   cyanocobalamin  1000 MCG tablet Commonly known as: VITAMIN B12 Take 1,000 mcg by mouth daily.   docusate sodium  50 MG capsule Commonly known as: COLACE Take 50 mg by mouth daily.   Eliquis  2.5 MG Tabs tablet Generic drug: apixaban  Take 1 tablet (2.5 mg total) by mouth 2 (two) times daily.   multivitamin with minerals Tabs tablet Take 1 tablet by mouth daily. Taking a prescription multi for kidney patients   oxyCODONE -acetaminophen  5-325 MG tablet Commonly known as: Percocet Take 1 tablet by mouth every 6 (six) hours as needed.   PRESERVISION AREDS 2 PO Take 1 capsule by mouth in the morning and at bedtime.   Probiotic 250 MG Caps Take 1 capsule by mouth in the morning and at bedtime.   rosuvastatin  10 MG tablet Commonly known as: CRESTOR  Take 10 mg by mouth at bedtime.   rosuvastatin  5 MG tablet Commonly known as: CRESTOR  Take 1 tablet (5 mg total) by mouth daily.   sevelamer  carbonate 800 MG tablet Commonly known as: RENVELA  Take 1,600 mg by mouth 3 (three) times daily.   sevelamer  carbonate 800 MG tablet Commonly known as: RENVELA  Take 2 tablets (1,600 mg total) by mouth 3 (three) times daily with meals.   tamsulosin  0.4 MG Caps capsule Commonly known as: FLOMAX  Take 0.4 mg by mouth daily.   torsemide  20 MG tablet Commonly known as: DEMADEX  Take 2 tablets (40 mg total) by mouth daily. What changed:  how much to take when to take this   traZODone  50 MG tablet Commonly known as: DESYREL  Take 50 mg by mouth at bedtime as needed for sleep.        Allergies: No Known Allergies  Family History: Family History  Problem Relation Age of Onset    Cirrhosis Mother    Other Father        Lung Fibrosis   Prostate cancer Neg Hx    Kidney cancer Neg Hx    Bladder Cancer Neg Hx     Social History:  reports that he quit smoking about 43 years ago. His smoking use included cigarettes.  He started smoking about 58 years ago. He has been exposed to tobacco smoke. He has never used smokeless tobacco. He reports that he does not currently use alcohol. He reports that he does not use drugs.  For pertinent review of systems please refer to history of present illness  Physical Exam: There were no vitals taken for this visit.  Constitutional:  Well nourished. Alert and oriented, No acute distress. HEENT: [place] AT, moist mucus membranes.  Trachea midline, no masses. Cardiovascular: No clubbing, cyanosis, or edema. Respiratory: Normal respiratory effort, no increased work of breathing. GI: Abdomen is soft, non tender, non distended, no abdominal masses. Liver and spleen not palpable.  No hernias appreciated.  Stool sample for occult testing is not indicated.   GU: No CVA tenderness.  No bladder fullness or masses.  Patient with circumcised/uncircumcised phallus. ***Foreskin easily retracted***  Urethral meatus is patent.  No penile discharge. No penile lesions or rashes. Scrotum without lesions, cysts, rashes and/or edema.  Testicles are located scrotally bilaterally. No masses are appreciated in the testicles. Left and right epididymis are normal. Rectal: Patient with  normal sphincter tone. Anus and perineum without scarring or rashes. No rectal masses are appreciated. Prostate is approximately *** grams, *** nodules are appreciated. Seminal vesicles are normal. Skin: No rashes, bruises or suspicious lesions. Lymph: No cervical or inguinal adenopathy. Neurologic: Grossly intact, no focal deficits, moving all 4 extremities. Psychiatric: Normal mood and affect.   Laboratory Data: See Epic and HPI   I have reviewed the labs.  Pertinent  Imaging N/A   Assessment & Plan:    1. High risk hematuria -work up 2022, NED -work up 2024, NED -no reports of gross heme   2. BPH with LUTS -continue conservative management, avoiding bladder irritants and timed voiding's -He is not undergoing dialysis 3 days weekly and is not producing as much urine  3. Right renal stone -asymptomatic   No follow-ups on file.  These notes generated with voice recognition software. I apologize for typographical errors.  CLOTILDA HELON RIGGERS  [facility] [address], [address] [place], [place] [address] [phone]  "

## 2024-01-08 ENCOUNTER — Ambulatory Visit: Payer: Self-pay | Admitting: Urology

## 2024-01-08 ENCOUNTER — Encounter: Payer: Self-pay | Admitting: Urology

## 2024-01-08 VITALS — BP 132/78 | HR 76 | Wt 170.0 lb

## 2024-01-08 DIAGNOSIS — N138 Other obstructive and reflux uropathy: Secondary | ICD-10-CM

## 2024-01-08 DIAGNOSIS — R319 Hematuria, unspecified: Secondary | ICD-10-CM | POA: Diagnosis not present

## 2024-01-08 DIAGNOSIS — N200 Calculus of kidney: Secondary | ICD-10-CM | POA: Diagnosis not present

## 2024-01-08 DIAGNOSIS — N401 Enlarged prostate with lower urinary tract symptoms: Secondary | ICD-10-CM

## 2024-01-08 LAB — BLADDER SCAN AMB NON-IMAGING

## 2024-01-08 LAB — MICROSCOPIC EXAMINATION

## 2024-01-08 LAB — URINALYSIS, COMPLETE
Bilirubin, UA: NEGATIVE
Glucose, UA: NEGATIVE
Ketones, UA: NEGATIVE
Nitrite, UA: NEGATIVE
Specific Gravity, UA: 1.02 (ref 1.005–1.030)
Urobilinogen, Ur: 0.2 mg/dL (ref 0.2–1.0)
pH, UA: 7.5 (ref 5.0–7.5)

## 2024-01-11 LAB — CULTURE, URINE COMPREHENSIVE

## 2024-01-12 ENCOUNTER — Ambulatory Visit: Payer: Self-pay | Admitting: Urology

## 2024-01-15 ENCOUNTER — Ambulatory Visit (INDEPENDENT_AMBULATORY_CARE_PROVIDER_SITE_OTHER): Admitting: Nurse Practitioner

## 2024-01-15 ENCOUNTER — Encounter (INDEPENDENT_AMBULATORY_CARE_PROVIDER_SITE_OTHER)

## 2024-02-03 ENCOUNTER — Other Ambulatory Visit: Admitting: Urology

## 2024-03-04 ENCOUNTER — Other Ambulatory Visit: Admitting: Urology

## 2024-03-09 ENCOUNTER — Other Ambulatory Visit

## 2024-03-30 ENCOUNTER — Ambulatory Visit (INDEPENDENT_AMBULATORY_CARE_PROVIDER_SITE_OTHER): Admitting: Nurse Practitioner

## 2024-03-30 ENCOUNTER — Encounter (INDEPENDENT_AMBULATORY_CARE_PROVIDER_SITE_OTHER)

## 2024-09-08 ENCOUNTER — Other Ambulatory Visit

## 2024-09-09 ENCOUNTER — Inpatient Hospital Stay

## 2024-09-21 ENCOUNTER — Ambulatory Visit: Admitting: Oncology
